# Patient Record
Sex: Male | Born: 1965 | Race: White | Hispanic: No | Marital: Single | Smoking: Former smoker
Health system: Southern US, Community
[De-identification: ages and names within clinical notes are randomized; demographics above are authoritative.]

## PROBLEM LIST (undated history)

## (undated) DIAGNOSIS — Z8739 Personal history of other diseases of the musculoskeletal system and connective tissue: Secondary | ICD-10-CM

## (undated) DIAGNOSIS — S73004A Unspecified dislocation of right hip, initial encounter: Secondary | ICD-10-CM

## (undated) DIAGNOSIS — I10 Essential (primary) hypertension: Secondary | ICD-10-CM

## (undated) DIAGNOSIS — Z992 Dependence on renal dialysis: Secondary | ICD-10-CM

## (undated) DIAGNOSIS — R7989 Other specified abnormal findings of blood chemistry: Secondary | ICD-10-CM

## (undated) DIAGNOSIS — S0990XA Unspecified injury of head, initial encounter: Secondary | ICD-10-CM

## (undated) DIAGNOSIS — K219 Gastro-esophageal reflux disease without esophagitis: Secondary | ICD-10-CM

## (undated) DIAGNOSIS — D649 Anemia, unspecified: Secondary | ICD-10-CM

## (undated) DIAGNOSIS — G629 Polyneuropathy, unspecified: Secondary | ICD-10-CM

## (undated) DIAGNOSIS — M869 Osteomyelitis, unspecified: Secondary | ICD-10-CM

## (undated) DIAGNOSIS — M069 Rheumatoid arthritis, unspecified: Secondary | ICD-10-CM

## (undated) DIAGNOSIS — E11621 Type 2 diabetes mellitus with foot ulcer: Secondary | ICD-10-CM

## (undated) DIAGNOSIS — I639 Cerebral infarction, unspecified: Secondary | ICD-10-CM

## (undated) DIAGNOSIS — Z9289 Personal history of other medical treatment: Secondary | ICD-10-CM

## (undated) DIAGNOSIS — F419 Anxiety disorder, unspecified: Secondary | ICD-10-CM

## (undated) DIAGNOSIS — M1460 Charcot's joint, unspecified site: Secondary | ICD-10-CM

## (undated) DIAGNOSIS — N186 End stage renal disease: Secondary | ICD-10-CM

## (undated) DIAGNOSIS — E119 Type 2 diabetes mellitus without complications: Secondary | ICD-10-CM

## (undated) DIAGNOSIS — R945 Abnormal results of liver function studies: Secondary | ICD-10-CM

## (undated) DIAGNOSIS — L97509 Non-pressure chronic ulcer of other part of unspecified foot with unspecified severity: Secondary | ICD-10-CM

## (undated) HISTORY — DX: Non-pressure chronic ulcer of other part of unspecified foot with unspecified severity: L97.509

## (undated) HISTORY — PX: FRACTURE SURGERY: SHX138

## (undated) HISTORY — PX: JOINT REPLACEMENT: SHX530

## (undated) HISTORY — DX: Charcot's joint, unspecified site: M14.60

## (undated) HISTORY — PX: ORIF HUMERUS FRACTURE: SHX2126

## (undated) HISTORY — DX: Unspecified dislocation of right hip, initial encounter: S73.004A

## (undated) HISTORY — PX: CLOSED REDUCTION HIP DISLOCATION: SUR221

## (undated) HISTORY — DX: Type 2 diabetes mellitus with foot ulcer: E11.621

---

## 1995-12-03 HISTORY — PX: TOTAL HIP ARTHROPLASTY: SHX124

## 1999-01-02 DIAGNOSIS — S0990XA Unspecified injury of head, initial encounter: Secondary | ICD-10-CM

## 1999-01-02 HISTORY — DX: Unspecified injury of head, initial encounter: S09.90XA

## 2003-07-18 ENCOUNTER — Encounter: Admission: RE | Admit: 2003-07-18 | Discharge: 2003-07-18 | Payer: Self-pay | Admitting: Family Medicine

## 2003-07-18 ENCOUNTER — Encounter: Payer: Self-pay | Admitting: Family Medicine

## 2005-06-26 ENCOUNTER — Inpatient Hospital Stay (HOSPITAL_COMMUNITY): Admission: EM | Admit: 2005-06-26 | Discharge: 2005-06-27 | Payer: Self-pay | Admitting: Emergency Medicine

## 2007-11-15 ENCOUNTER — Inpatient Hospital Stay (HOSPITAL_COMMUNITY): Admission: EM | Admit: 2007-11-15 | Discharge: 2007-11-20 | Payer: Self-pay | Admitting: Emergency Medicine

## 2007-11-16 ENCOUNTER — Encounter (INDEPENDENT_AMBULATORY_CARE_PROVIDER_SITE_OTHER): Payer: Self-pay | Admitting: Orthopedic Surgery

## 2007-11-17 ENCOUNTER — Ambulatory Visit: Payer: Self-pay | Admitting: Surgery

## 2007-12-09 ENCOUNTER — Encounter (HOSPITAL_BASED_OUTPATIENT_CLINIC_OR_DEPARTMENT_OTHER): Admission: RE | Admit: 2007-12-09 | Discharge: 2008-01-01 | Payer: Self-pay | Admitting: Surgery

## 2009-12-02 HISTORY — PX: FOOT SURGERY: SHX648

## 2010-01-06 ENCOUNTER — Emergency Department (HOSPITAL_COMMUNITY): Admission: EM | Admit: 2010-01-06 | Discharge: 2010-01-07 | Payer: Self-pay | Admitting: Emergency Medicine

## 2010-07-03 ENCOUNTER — Inpatient Hospital Stay (HOSPITAL_COMMUNITY): Admission: EM | Admit: 2010-07-03 | Discharge: 2010-07-13 | Payer: Self-pay | Admitting: Emergency Medicine

## 2010-07-05 ENCOUNTER — Ambulatory Visit: Payer: Self-pay | Admitting: Infectious Diseases

## 2010-08-01 ENCOUNTER — Encounter (HOSPITAL_BASED_OUTPATIENT_CLINIC_OR_DEPARTMENT_OTHER): Admission: RE | Admit: 2010-08-01 | Discharge: 2010-10-30 | Payer: Self-pay | Admitting: Internal Medicine

## 2010-11-01 ENCOUNTER — Encounter (HOSPITAL_BASED_OUTPATIENT_CLINIC_OR_DEPARTMENT_OTHER)
Admission: RE | Admit: 2010-11-01 | Discharge: 2010-11-22 | Payer: Self-pay | Source: Home / Self Care | Attending: Internal Medicine | Admitting: Internal Medicine

## 2011-02-12 LAB — GLUCOSE, CAPILLARY: Glucose-Capillary: 136 mg/dL — ABNORMAL HIGH (ref 70–99)

## 2011-02-14 LAB — GLUCOSE, CAPILLARY: Glucose-Capillary: 148 mg/dL — ABNORMAL HIGH (ref 70–99)

## 2011-02-15 LAB — CBC
HCT: 28 % — ABNORMAL LOW (ref 39.0–52.0)
HCT: 28.1 % — ABNORMAL LOW (ref 39.0–52.0)
HCT: 30.3 % — ABNORMAL LOW (ref 39.0–52.0)
HCT: 32.1 % — ABNORMAL LOW (ref 39.0–52.0)
Hemoglobin: 10.7 g/dL — ABNORMAL LOW (ref 13.0–17.0)
Hemoglobin: 11.7 g/dL — ABNORMAL LOW (ref 13.0–17.0)
Hemoglobin: 9.9 g/dL — ABNORMAL LOW (ref 13.0–17.0)
Hemoglobin: 9.9 g/dL — ABNORMAL LOW (ref 13.0–17.0)
MCH: 33.9 pg (ref 26.0–34.0)
MCH: 34 pg (ref 26.0–34.0)
MCH: 34.1 pg — ABNORMAL HIGH (ref 26.0–34.0)
MCH: 34.4 pg — ABNORMAL HIGH (ref 26.0–34.0)
MCH: 34.6 pg — ABNORMAL HIGH (ref 26.0–34.0)
MCHC: 34.7 g/dL (ref 30.0–36.0)
MCHC: 35.4 g/dL (ref 30.0–36.0)
MCV: 97.1 fL (ref 78.0–100.0)
MCV: 97.2 fL (ref 78.0–100.0)
MCV: 98.1 fL (ref 78.0–100.0)
MCV: 99.1 fL (ref 78.0–100.0)
MCV: 99.1 fL (ref 78.0–100.0)
Platelets: 156 10*3/uL (ref 150–400)
Platelets: 174 10*3/uL (ref 150–400)
Platelets: 178 10*3/uL (ref 150–400)
Platelets: 552 10*3/uL — ABNORMAL HIGH (ref 150–400)
Platelets: 718 10*3/uL — ABNORMAL HIGH (ref 150–400)
RBC: 2.88 MIL/uL — ABNORMAL LOW (ref 4.22–5.81)
RBC: 3.1 MIL/uL — ABNORMAL LOW (ref 4.22–5.81)
RBC: 3.24 MIL/uL — ABNORMAL LOW (ref 4.22–5.81)
RBC: 3.4 MIL/uL — ABNORMAL LOW (ref 4.22–5.81)
RDW: 13.5 % (ref 11.5–15.5)
RDW: 14.1 % (ref 11.5–15.5)
RDW: 14.1 % (ref 11.5–15.5)
WBC: 10.1 10*3/uL (ref 4.0–10.5)
WBC: 14.2 10*3/uL — ABNORMAL HIGH (ref 4.0–10.5)
WBC: 17.9 10*3/uL — ABNORMAL HIGH (ref 4.0–10.5)
WBC: 19.8 10*3/uL — ABNORMAL HIGH (ref 4.0–10.5)

## 2011-02-15 LAB — GLUCOSE, CAPILLARY
Glucose-Capillary: 117 mg/dL — ABNORMAL HIGH (ref 70–99)
Glucose-Capillary: 138 mg/dL — ABNORMAL HIGH (ref 70–99)
Glucose-Capillary: 139 mg/dL — ABNORMAL HIGH (ref 70–99)
Glucose-Capillary: 140 mg/dL — ABNORMAL HIGH (ref 70–99)
Glucose-Capillary: 149 mg/dL — ABNORMAL HIGH (ref 70–99)
Glucose-Capillary: 154 mg/dL — ABNORMAL HIGH (ref 70–99)
Glucose-Capillary: 158 mg/dL — ABNORMAL HIGH (ref 70–99)
Glucose-Capillary: 158 mg/dL — ABNORMAL HIGH (ref 70–99)
Glucose-Capillary: 159 mg/dL — ABNORMAL HIGH (ref 70–99)
Glucose-Capillary: 168 mg/dL — ABNORMAL HIGH (ref 70–99)
Glucose-Capillary: 175 mg/dL — ABNORMAL HIGH (ref 70–99)
Glucose-Capillary: 197 mg/dL — ABNORMAL HIGH (ref 70–99)
Glucose-Capillary: 200 mg/dL — ABNORMAL HIGH (ref 70–99)
Glucose-Capillary: 214 mg/dL — ABNORMAL HIGH (ref 70–99)
Glucose-Capillary: 219 mg/dL — ABNORMAL HIGH (ref 70–99)
Glucose-Capillary: 227 mg/dL — ABNORMAL HIGH (ref 70–99)
Glucose-Capillary: 229 mg/dL — ABNORMAL HIGH (ref 70–99)
Glucose-Capillary: 247 mg/dL — ABNORMAL HIGH (ref 70–99)
Glucose-Capillary: 286 mg/dL — ABNORMAL HIGH (ref 70–99)
Glucose-Capillary: 313 mg/dL — ABNORMAL HIGH (ref 70–99)
Glucose-Capillary: 320 mg/dL — ABNORMAL HIGH (ref 70–99)
Glucose-Capillary: 365 mg/dL — ABNORMAL HIGH (ref 70–99)

## 2011-02-15 LAB — BLOOD GAS, ARTERIAL
Bicarbonate: 23.7 mEq/L (ref 20.0–24.0)
Drawn by: 270211
FIO2: 1 %
O2 Saturation: 98.8 %
Patient temperature: 101.2
pH, Arterial: 7.483 — ABNORMAL HIGH (ref 7.350–7.450)
pO2, Arterial: 123 mmHg — ABNORMAL HIGH (ref 80.0–100.0)

## 2011-02-15 LAB — BASIC METABOLIC PANEL
BUN: 5 mg/dL — ABNORMAL LOW (ref 6–23)
BUN: 6 mg/dL (ref 6–23)
CO2: 26 mEq/L (ref 19–32)
CO2: 27 mEq/L (ref 19–32)
CO2: 27 mEq/L (ref 19–32)
CO2: 28 mEq/L (ref 19–32)
CO2: 28 mEq/L (ref 19–32)
Calcium: 7.7 mg/dL — ABNORMAL LOW (ref 8.4–10.5)
Calcium: 8.2 mg/dL — ABNORMAL LOW (ref 8.4–10.5)
Calcium: 8.4 mg/dL (ref 8.4–10.5)
Calcium: 9.1 mg/dL (ref 8.4–10.5)
Chloride: 101 mEq/L (ref 96–112)
Chloride: 95 mEq/L — ABNORMAL LOW (ref 96–112)
Chloride: 96 mEq/L (ref 96–112)
Chloride: 96 mEq/L (ref 96–112)
Chloride: 97 mEq/L (ref 96–112)
Chloride: 97 mEq/L (ref 96–112)
Creatinine, Ser: 0.89 mg/dL (ref 0.4–1.5)
Creatinine, Ser: 0.96 mg/dL (ref 0.4–1.5)
Creatinine, Ser: 1.01 mg/dL (ref 0.4–1.5)
Creatinine, Ser: 1.02 mg/dL (ref 0.4–1.5)
Creatinine, Ser: 1.08 mg/dL (ref 0.4–1.5)
Creatinine, Ser: 1.11 mg/dL (ref 0.4–1.5)
GFR calc Af Amer: >60 mL/min (ref >60)
GFR calc Af Amer: >60 mL/min (ref >60)
GFR calc Af Amer: >60 mL/min (ref >60)
GFR calc Af Amer: >60 mL/min (ref >60)
GFR calc non Af Amer: >60 mL/min (ref >60)
GFR calc non Af Amer: >60 mL/min (ref >60)
GFR calc non Af Amer: >60 mL/min (ref >60)
Glucose, Bld: 108 mg/dL — ABNORMAL HIGH (ref 70–99)
Glucose, Bld: 115 mg/dL — ABNORMAL HIGH (ref 70–99)
Glucose, Bld: 184 mg/dL — ABNORMAL HIGH (ref 70–99)
Glucose, Bld: 230 mg/dL — ABNORMAL HIGH (ref 70–99)
Potassium: 3.2 mEq/L — ABNORMAL LOW (ref 3.5–5.1)
Potassium: 3.5 mEq/L (ref 3.5–5.1)
Potassium: 3.8 mEq/L (ref 3.5–5.1)
Sodium: 128 mEq/L — ABNORMAL LOW (ref 135–145)
Sodium: 128 mEq/L — ABNORMAL LOW (ref 135–145)
Sodium: 131 mEq/L — ABNORMAL LOW (ref 135–145)

## 2011-02-15 LAB — FOLATE: Folate: 13.4 ng/mL

## 2011-02-15 LAB — COMPREHENSIVE METABOLIC PANEL
ALT: 32 U/L (ref 0–53)
AST: 43 U/L — ABNORMAL HIGH (ref 0–37)
Albumin: 2.9 g/dL — ABNORMAL LOW (ref 3.5–5.2)
Alkaline Phosphatase: 143 U/L — ABNORMAL HIGH (ref 39–117)
Potassium: 3.1 mEq/L — ABNORMAL LOW (ref 3.5–5.1)
Sodium: 130 mEq/L — ABNORMAL LOW (ref 135–145)
Total Protein: 7 g/dL (ref 6.0–8.3)

## 2011-02-15 LAB — IRON AND TIBC
Saturation Ratios: 4 % — ABNORMAL LOW (ref 20–55)
TIBC: 238 ug/dL (ref 215–435)
UIBC: 228 ug/dL

## 2011-02-15 LAB — CULTURE, BLOOD (ROUTINE X 2)
Culture: NO GROWTH
Culture: NO GROWTH

## 2011-02-15 LAB — ANAEROBIC CULTURE

## 2011-02-15 LAB — HEMOCCULT GUIAC POC 1CARD (OFFICE): Fecal Occult Bld: NEGATIVE

## 2011-02-15 LAB — DIFFERENTIAL
Basophils Relative: 1 % (ref 0–1)
Eosinophils Relative: 0 % (ref 0–5)
Lymphs Abs: 1 10*3/uL (ref 0.7–4.0)
Monocytes Absolute: 0.6 10*3/uL (ref 0.1–1.0)
Neutro Abs: 18 10*3/uL — ABNORMAL HIGH (ref 1.7–7.7)

## 2011-02-15 LAB — BODY FLUID CULTURE

## 2011-02-15 LAB — PROCALCITONIN: Procalcitonin: 1.96 ng/mL

## 2011-02-15 LAB — VANCOMYCIN, TROUGH: Vancomycin Tr: 16.1 ug/mL (ref 10.0–20.0)

## 2011-02-15 LAB — LACTIC ACID, PLASMA: Lactic Acid, Venous: 1.7 mmol/L (ref 0.5–2.2)

## 2011-02-15 LAB — WOUND CULTURE

## 2011-02-15 LAB — GRAM STAIN

## 2011-02-15 LAB — HIV ANTIBODY (ROUTINE TESTING W REFLEX): HIV: NONREACTIVE

## 2011-02-15 LAB — URIC ACID: Uric Acid, Serum: 3.3 mg/dL — ABNORMAL LOW (ref 4.0–7.8)

## 2011-02-15 LAB — MRSA PCR SCREENING: MRSA by PCR: NEGATIVE

## 2011-02-15 LAB — FERRITIN: Ferritin: 385 ng/mL — ABNORMAL HIGH (ref 22–322)

## 2011-02-20 LAB — GLUCOSE, CAPILLARY

## 2011-03-05 ENCOUNTER — Encounter (HOSPITAL_BASED_OUTPATIENT_CLINIC_OR_DEPARTMENT_OTHER): Payer: 59 | Attending: Internal Medicine

## 2011-03-05 DIAGNOSIS — Z79899 Other long term (current) drug therapy: Secondary | ICD-10-CM | POA: Insufficient documentation

## 2011-03-05 DIAGNOSIS — Z96649 Presence of unspecified artificial hip joint: Secondary | ICD-10-CM | POA: Insufficient documentation

## 2011-03-05 DIAGNOSIS — Z794 Long term (current) use of insulin: Secondary | ICD-10-CM | POA: Insufficient documentation

## 2011-03-05 DIAGNOSIS — L97509 Non-pressure chronic ulcer of other part of unspecified foot with unspecified severity: Secondary | ICD-10-CM | POA: Insufficient documentation

## 2011-03-05 DIAGNOSIS — E1149 Type 2 diabetes mellitus with other diabetic neurological complication: Secondary | ICD-10-CM | POA: Insufficient documentation

## 2011-03-05 DIAGNOSIS — F17200 Nicotine dependence, unspecified, uncomplicated: Secondary | ICD-10-CM | POA: Insufficient documentation

## 2011-03-05 DIAGNOSIS — E1169 Type 2 diabetes mellitus with other specified complication: Secondary | ICD-10-CM | POA: Insufficient documentation

## 2011-03-05 DIAGNOSIS — M2040 Other hammer toe(s) (acquired), unspecified foot: Secondary | ICD-10-CM | POA: Insufficient documentation

## 2011-03-07 ENCOUNTER — Other Ambulatory Visit (HOSPITAL_BASED_OUTPATIENT_CLINIC_OR_DEPARTMENT_OTHER): Payer: Self-pay | Admitting: General Surgery

## 2011-03-07 ENCOUNTER — Ambulatory Visit (HOSPITAL_COMMUNITY)
Admission: RE | Admit: 2011-03-07 | Discharge: 2011-03-07 | Disposition: A | Discharge status: Home / Self Care | Payer: 59 | Source: Ambulatory Visit | Attending: General Surgery | Admitting: General Surgery

## 2011-03-07 DIAGNOSIS — M869 Osteomyelitis, unspecified: Secondary | ICD-10-CM

## 2011-03-07 DIAGNOSIS — S91309A Unspecified open wound, unspecified foot, initial encounter: Secondary | ICD-10-CM | POA: Insufficient documentation

## 2011-03-07 DIAGNOSIS — X58XXXA Exposure to other specified factors, initial encounter: Secondary | ICD-10-CM | POA: Insufficient documentation

## 2011-03-07 DIAGNOSIS — M216X9 Other acquired deformities of unspecified foot: Secondary | ICD-10-CM | POA: Insufficient documentation

## 2011-03-07 DIAGNOSIS — E119 Type 2 diabetes mellitus without complications: Secondary | ICD-10-CM | POA: Insufficient documentation

## 2011-03-21 ENCOUNTER — Other Ambulatory Visit (HOSPITAL_BASED_OUTPATIENT_CLINIC_OR_DEPARTMENT_OTHER): Payer: Self-pay | Admitting: Internal Medicine

## 2011-03-21 LAB — GLUCOSE, CAPILLARY: Glucose-Capillary: 238 mg/dL — ABNORMAL HIGH (ref 70–99)

## 2011-04-11 ENCOUNTER — Encounter (HOSPITAL_BASED_OUTPATIENT_CLINIC_OR_DEPARTMENT_OTHER): Payer: 59
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
## 2011-04-16 NOTE — Assessment & Plan Note (Signed)
[facility]   NAME:  Wesley Burns, Wesley Burns              ACCOUNT NO.:  [id number]   MEDICAL RECORD NO.:  [id number]      DATE OF BIRTH:  1966/02/17   PHYSICIAN:  Theresia Majors. Tanda Rockers, M.D. VISIT DATE:  12/21/2007                                   OFFICE VISIT   SUBJECTIVE:  Mr. Dumond is a 45 year old male who we have seen for  neuropathic ulcers involving both feet. In the interim he has treated  with modified offloading healing sandals and continued antiseptic soap  washing and foot hygiene.  He returns for follow-up.  There has been no  excessive drainage, malodor,  or fever. He is complaining of occasional  pain.   OBJECTIVE:  Inspection of both feet shows that the ulcers are clean.  There are halos of callus with some desquamated and nonviable tissue.  No debridement was needed.  The new wounds were numbered 1, 2, 3 and 4.  They were measured and entered into the database.  Please refer to those  entries.   PHYSICAL EXAMINATION:  VITALS:  Blood pressure is 145/90, respirations  16, pulse rate 90, temperature is 98.8.  EXTREMITIES:  Both feet are warm with readidly palpable pulses.  They  are insensate to the signs Weinstein filament.  There is bilateral 1+  edema. There is no  ascending lymphangitis, cellulitis.   IMPRESSION:  Neuropathic ulcers improved.   PLAN:  We have given the patient prescription for bilateral custom  inserts for offloading of the met heads.  We have modified the healing  sandals with transverse felt strips. We have instructed the patient to  continue local hygiene utilizing antiseptic soap.  We will reevaluate  him in 2 weeks. In the interim he will be seen by [facility] for his custom  orthotics.  He has been given a singel prescription for Vicodan and  advised to identify his PCP prior to his next visit as he may need  ongoing management of his neuropathic pain. We have given the patient an  opportunity to ask questions.  He seems to  understand and indicates that  he will be compliant.      Harold A. Tanda Rockers, M.D.  Electronically Signed     HAN/MEDQ  D:  12/21/2007  T:  12/21/2007  Job:  [id number]

## 2011-04-16 NOTE — Discharge Summary (Signed)
Wesley Burns, Wesley Burns              ACCOUNT NO.:  [id number]   MEDICAL RECORD NO.:  [id number]          PATIENT TYPE:  INP   LOCATION:  5015                         FACILITY:  [facility]   PHYSICIAN:  Madaline Savage, MD        DATE OF BIRTH:  August 03, 1966   DATE OF ADMISSION:  11/15/2007  DATE OF DISCHARGE:  11/20/2007                               DISCHARGE SUMMARY   PRIMARY CARE PHYSICIAN:  None.  This patient is unassigned to [place].   CONSULTATIONS IN THE HOSPITAL:  Patient was seen by Dr. Shon Baton from  orthopedics.   FINAL DISCHARGE DIAGNOSES:  1. A nonhealing infection in the left foot.  2. 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  3. Prediabetes.  4. Hypertension.  5. Febrile illness.   DISCHARGE MEDICATIONS:  1. Clindamycin 450 mg 4 times daily x2 weeks.  2. Norvasc 5 mg once daily.  3. Colchicine 0.6 mg every 6 hours as needed.  4. Debrox ear drops 2 drops to the left ear twice daily.  5. Tylenol P.M. at bedtime as needed.  6. Ibuprofen 800 mg 3 times daily as needed.  7. Centrum multivitamin daily.   HISTORY OF PRESENT ILLNESS:  For a full history and physical, see the  history and physical dictated by Dr. Christella Noa on November 15, 2007.  Mr.  Jaworski is a 45 year old gentleman who was admitted with left foot  swelling and pain for the last couple of days.  [name] has had a  nonhealing infection in his left second toe, which has been going on for  at least six months, which he states has probably been going on for the  last two years.  He has not seen a doctor for this.  He was in the past  seen by [facility] approximately two years ago.  He was  admitted with a question of osteomyelitis in his left foot, and  orthopedics were consulted.   PROCEDURES DONE IN THE HOSPITAL:  1. He had a chest x-ray done on November 15, 2007 which showed no      acute cardiopulmonary findings.  2. He had an x-ray of the left foot done on November 15, 2007 which      showed probable  osteomyelitis of the tuft of the distal pharyngeal      bone of the third toe.  3. He had an x-ray of the right foot on November 15, 2007 which showed      hallux or valgus with degenerative changes.  4. He had an MRI of the left foot done on November 16, 2007 which      showed Charcot foot changes with severe degenerative disease.      Remote healed third metatarsal shaft fracture.  Mild diffuse      myositis.  Soft tissue ulcer and cellulitis involving the great toe      without MR findings of osteomyelitis.  Findings suspicious for      osteomyelitis involving the second and third distal tuft.  Advanced      degenerative disease at the first and second  metatarsophalangeal      joints.  5. He had an MRA of the right foot done on November 16, 2007 which      showed early Charcot changes, abnormal signal intensity in the      cuboid, lateral cuneiform, and fourth metatarsal.  Cannot totally      excluded osteomyelitis but it is less likely first      metatarsophalangeal joint, inflammatory arthropathy, gout is a      strong possibility.  6. He had an x-ray of his right knee done on November 17, 2007 which      showed possible small joint effusion.  7. He had a bone scan done on November 16, 2007 which showed extensive      mid foot uptake on the left, likely due to Charcot changes.  There      are findings suspicious for osteomyelitis involving the second and      third distal phalanges.  In the right, there is increased uptake      laterally in the mid foot.  It may be stress related.   Nonhealing left foot infection:  [name] was admitted with increased  swelling in his left foot and was admitted and started on Unasyn and  Clindamycin.  We consulted orthopedics, Dr. Shon Baton.  Dr. Shon Baton reviewed  the MRI and bone scan.  At this time, there is no definite osteomyelitis  in his foot.  At this time, he recommended to continue treating with  antibiotics and following up with Dr.  Lestine Box with [facility].  He has seen [facility] in the past.  We did a  wound culture on him, which grew Staphylococcus aureus, which was  methicillin sensitive and was sensitive to Clindamycin.  So we will be  continuing him on Clindamycin at this time for two weeks.  He will also  need to be followed up by the wound care clinic, as per Dr. Shon Baton.  Dr.  Shon Baton has also gotten him diabetic boots.   Acute gout:  [name] had acute swelling of his right knee.  We tapped  the right knee, and the synovial fluid showed intra and extracellular  monosodium urate crystals which forms acute gout.  He had a white count  of 43,000, and his cultures were negative.  His Gram's stain was also  negative.  He was started on colchicine with remarkable improvement.  At  this time, he is able to bear weight on his right leg without much  difficulty.  I have asked him to continue colchicine until his pain  completely goes away.  This will need to be followed up as an  outpatient. He will need to be started on Allopurinol as an outpatient  once this acute episode subsides.   Prediabetes:  [name] had episodes where his sugars were high while  he was in the hospital.  He had a fasting blood sugar of 158 on one  episode.  This could not repeated again.  He also had another fasting  blood sugar which was 123.  He definitely qualifies criteria for  prediabetes.  We have had diabetic education while he was in the  hospital, and I will refer him for outpatient diabetic education.  We  can consider starting him on Metformin as an outpatient.  He will need  close followup for this.   Hypertension:  His blood pressures were elevated while he was in the  hospital.  He was started on  Norvasc and since then, his blood pressure  has been well controlled.  I will continue him on Norvasc at 5 mg daily.   Febrile illness:  [name] spiked a fever of up to 102, which has been  trending  down.  At the time of discharge, he is afebrile.  He had an  elevated white count up to 16,000, which has come down to normal at the  time of discharge.  I have told him that if he starts spiking fever as  an outpatient, then he will need to come back to the emergency room.   DISPOSITION:  At this time, he is being discharged home in a stable  condition.  He is asked to follow up with Dr. Lestine Box at [facility] in 1-2 weeks.  He is given his phone number of [phone].  We  will arrange for followup with [facility] in 1-2 weeks.  I will also  arrange for a wound care clinic followup in 1-2 weeks.      Madaline Savage, MD  Electronically Signed     PKN/MEDQ  D:  11/20/2007  T:  11/20/2007  Job:  [id number]
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
## 2011-04-19 NOTE — Consult Note (Signed)
Wesley Burns, Wesley Burns              ACCOUNT NO.:  [id number]   MEDICAL RECORD NO.:  [id number]          PATIENT TYPE:  INP   LOCATION:  1422                         [facility]   PHYSICIAN:  Nadara Mustard, MD     DATE OF BIRTH:  08-07-1966   DATE OF CONSULTATION:  DATE OF DISCHARGE:                                   CONSULTATION   Audio too short to transcribe (less than 5 seconds)       MVD/MEDQ  D:  06/27/2005  T:  06/27/2005  Job:  [id number]
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
## 2011-09-06 LAB — DIFFERENTIAL
Basophils Absolute: 0
Basophils Relative: 0
Basophils Relative: 0
Eosinophils Absolute: 0.3
Eosinophils Absolute: 0.4
Eosinophils Relative: 1
Eosinophils Relative: 3
Lymphocytes Relative: 14
Lymphs Abs: 2.3
Monocytes Absolute: 1.1 — ABNORMAL HIGH
Monocytes Absolute: 1.4 — ABNORMAL HIGH
Monocytes Relative: 11
Neutro Abs: 5
Neutrophils Relative %: 64

## 2011-09-06 LAB — BASIC METABOLIC PANEL
BUN: 6
CO2: 25
CO2: 27
CO2: 30
Calcium: 7.5 — ABNORMAL LOW
Chloride: 102
Chloride: 99
Chloride: 99
Chloride: 99
Creatinine, Ser: 1.03
Creatinine, Ser: 1.38
GFR calc Af Amer: 53 — ABNORMAL LOW
GFR calc Af Amer: >60
GFR calc Af Amer: >60
GFR calc non Af Amer: 53 — ABNORMAL LOW
Glucose, Bld: 91
Potassium: 3.3 — ABNORMAL LOW
Potassium: 3.7
Potassium: 3.7
Sodium: 135
Sodium: 138
Sodium: 139

## 2011-09-06 LAB — BODY FLUID CULTURE: Culture: NO GROWTH

## 2011-09-06 LAB — CBC
HCT: 31.9 — ABNORMAL LOW
HCT: 34.7 — ABNORMAL LOW
Hemoglobin: 11.2 — ABNORMAL LOW
Hemoglobin: 11.7 — ABNORMAL LOW
MCHC: 34.5
MCHC: 35.2
MCV: 100.7 — ABNORMAL HIGH
MCV: 99.1
Platelets: 246
RBC: 3.22 — ABNORMAL LOW
RDW: 13.7
WBC: 16.1 — ABNORMAL HIGH

## 2011-09-06 LAB — SYNOVIAL CELL COUNT + DIFF, W/ CRYSTALS: Neutrophil, Synovial: 97 — ABNORMAL HIGH

## 2011-09-06 LAB — POTASSIUM: Potassium: 3.4 — ABNORMAL LOW

## 2011-09-06 LAB — GLUCOSE, SYNOVIAL FLUID: Glucose, Synovial Fluid: 55

## 2011-09-06 LAB — PROTEIN, BODY FLUID: Total protein, fluid: 2.8

## 2011-09-09 LAB — HEMOGLOBIN A1C
Hgb A1c MFr Bld: 5.8
Mean Plasma Glucose: 129

## 2011-09-09 LAB — CBC
HCT: 46.5
Hemoglobin: 16
MCHC: 34.5
MCV: 100.7 — ABNORMAL HIGH
RBC: 4.62
RDW: 13.6

## 2011-09-09 LAB — COMPREHENSIVE METABOLIC PANEL
ALT: 16
BUN: <1 — ABNORMAL LOW
CO2: 27
Calcium: 9.3
Creatinine, Ser: 0.57
GFR calc non Af Amer: >60
Glucose, Bld: 123 — ABNORMAL HIGH
Sodium: 139
Total Protein: 8.6 — ABNORMAL HIGH

## 2011-09-09 LAB — URINE CULTURE: Culture: NO GROWTH

## 2011-09-09 LAB — DIFFERENTIAL
Basophils Absolute: 0.1
Basophils Relative: 1
Neutro Abs: 3.7
Neutrophils Relative %: 46

## 2011-09-09 LAB — CULTURE, BLOOD (ROUTINE X 2)
Culture: NO GROWTH
Culture: NO GROWTH

## 2011-09-09 LAB — URINALYSIS, ROUTINE W REFLEX MICROSCOPIC
Bilirubin Urine: NEGATIVE
Nitrite: NEGATIVE
Protein, ur: NEGATIVE
Specific Gravity, Urine: 1.009
Urobilinogen, UA: 0.2

## 2011-09-09 LAB — WOUND CULTURE

## 2011-09-09 LAB — CORTISOL: Cortisol, Plasma: 12.7

## 2011-09-09 LAB — PROTIME-INR: Prothrombin Time: 12.7

## 2011-09-24 ENCOUNTER — Emergency Department (HOSPITAL_COMMUNITY): Payer: 59

## 2011-09-24 ENCOUNTER — Inpatient Hospital Stay (HOSPITAL_COMMUNITY)
Admission: EM | Admit: 2011-09-24 | Discharge: 2011-09-27 | DRG: 481 | Disposition: A | Discharge status: Home / Self Care | Payer: 59 | Attending: Orthopedic Surgery | Admitting: Orthopedic Surgery

## 2011-09-24 DIAGNOSIS — T84029A Dislocation of unspecified internal joint prosthesis, initial encounter: Principal | ICD-10-CM | POA: Diagnosis present

## 2011-09-24 DIAGNOSIS — X58XXXA Exposure to other specified factors, initial encounter: Secondary | ICD-10-CM

## 2011-09-24 DIAGNOSIS — I10 Essential (primary) hypertension: Secondary | ICD-10-CM | POA: Diagnosis present

## 2011-09-24 DIAGNOSIS — Y92009 Unspecified place in unspecified non-institutional (private) residence as the place of occurrence of the external cause: Secondary | ICD-10-CM

## 2011-09-24 DIAGNOSIS — Z96649 Presence of unspecified artificial hip joint: Secondary | ICD-10-CM

## 2011-09-24 DIAGNOSIS — E119 Type 2 diabetes mellitus without complications: Secondary | ICD-10-CM | POA: Diagnosis present

## 2011-09-24 DIAGNOSIS — F17200 Nicotine dependence, unspecified, uncomplicated: Secondary | ICD-10-CM | POA: Diagnosis present

## 2011-09-24 DIAGNOSIS — L97809 Non-pressure chronic ulcer of other part of unspecified lower leg with unspecified severity: Secondary | ICD-10-CM | POA: Diagnosis present

## 2011-09-24 DIAGNOSIS — Z794 Long term (current) use of insulin: Secondary | ICD-10-CM

## 2011-09-24 LAB — COMPREHENSIVE METABOLIC PANEL
ALT: 45 U/L (ref 0–53)
AST: 105 U/L — ABNORMAL HIGH (ref 0–37)
Albumin: 3.3 g/dL — ABNORMAL LOW (ref 3.5–5.2)
Alkaline Phosphatase: 177 U/L — ABNORMAL HIGH (ref 39–117)
BUN: <3 mg/dL — ABNORMAL LOW (ref 6–23)
CO2: 23 mEq/L (ref 19–32)
Calcium: 8.6 mg/dL (ref 8.4–10.5)
Chloride: 95 mEq/L — ABNORMAL LOW (ref 96–112)
Creatinine, Ser: 0.51 mg/dL (ref 0.50–1.35)
GFR calc Af Amer: >90 mL/min (ref >90)
GFR calc non Af Amer: >90 mL/min (ref >90)
Glucose, Bld: 98 mg/dL (ref 70–99)
Potassium: 3.2 mEq/L — ABNORMAL LOW (ref 3.5–5.1)
Sodium: 135 mEq/L (ref 135–145)
Total Bilirubin: 0.7 mg/dL (ref 0.3–1.2)
Total Protein: 7.1 g/dL (ref 6.0–8.3)

## 2011-09-24 LAB — DIFFERENTIAL
Basophils Absolute: 0.1 10*3/uL (ref 0.0–0.1)
Basophils Relative: 1 % (ref 0–1)
Eosinophils Absolute: 0.1 10*3/uL (ref 0.0–0.7)
Eosinophils Relative: 1 % (ref 0–5)
Lymphocytes Relative: 18 % (ref 12–46)
Lymphs Abs: 1.5 10*3/uL (ref 0.7–4.0)
Monocytes Absolute: 1 10*3/uL (ref 0.1–1.0)
Monocytes Relative: 12 % (ref 3–12)
Neutro Abs: 5.7 10*3/uL (ref 1.7–7.7)
Neutrophils Relative %: 69 % (ref 43–77)

## 2011-09-24 LAB — CBC
HCT: 46.6 % (ref 39.0–52.0)
Hemoglobin: 16.7 g/dL (ref 13.0–17.0)
MCH: 34.3 pg — ABNORMAL HIGH (ref 26.0–34.0)
MCHC: 35.8 g/dL (ref 30.0–36.0)
MCV: 95.7 fL (ref 78.0–100.0)
Platelets: 240 10*3/uL (ref 150–400)
RBC: 4.87 MIL/uL (ref 4.22–5.81)
RDW: 14.5 % (ref 11.5–15.5)
WBC: 8.3 10*3/uL (ref 4.0–10.5)

## 2011-09-25 ENCOUNTER — Emergency Department (HOSPITAL_COMMUNITY): Payer: 59

## 2011-09-25 ENCOUNTER — Inpatient Hospital Stay (HOSPITAL_COMMUNITY): Payer: 59

## 2011-09-25 LAB — COMPREHENSIVE METABOLIC PANEL
ALT: 37 U/L (ref 0–53)
AST: 63 U/L — ABNORMAL HIGH (ref 0–37)
CO2: 30 mEq/L (ref 19–32)
Calcium: 8.1 mg/dL — ABNORMAL LOW (ref 8.4–10.5)
Chloride: 91 mEq/L — ABNORMAL LOW (ref 96–112)
Creatinine, Ser: 0.5 mg/dL (ref 0.50–1.35)
GFR calc Af Amer: >90 mL/min (ref >90)
GFR calc non Af Amer: >90 mL/min (ref >90)
Glucose, Bld: 135 mg/dL — ABNORMAL HIGH (ref 70–99)
Sodium: 135 mEq/L (ref 135–145)
Total Bilirubin: 0.9 mg/dL (ref 0.3–1.2)

## 2011-09-25 LAB — GLUCOSE, CAPILLARY
Glucose-Capillary: 128 mg/dL — ABNORMAL HIGH (ref 70–99)
Glucose-Capillary: 124 mg/dL — ABNORMAL HIGH (ref 70–99)
Glucose-Capillary: 109 mg/dL — ABNORMAL HIGH (ref 70–99)

## 2011-09-25 LAB — CBC
Hemoglobin: 16 g/dL (ref 13.0–17.0)
MCH: 34.3 pg — ABNORMAL HIGH (ref 26.0–34.0)
MCV: 97.4 fL (ref 78.0–100.0)
Platelets: 242 10*3/uL (ref 150–400)
RBC: 4.67 MIL/uL (ref 4.22–5.81)
WBC: 7.7 10*3/uL (ref 4.0–10.5)

## 2011-09-25 LAB — DIFFERENTIAL
Lymphs Abs: 1.2 10*3/uL (ref 0.7–4.0)
Monocytes Relative: 13 % — ABNORMAL HIGH (ref 3–12)
Neutro Abs: 5.4 10*3/uL (ref 1.7–7.7)
Neutrophils Relative %: 70 % (ref 43–77)

## 2011-09-26 LAB — GLUCOSE, CAPILLARY
Glucose-Capillary: 112 mg/dL — ABNORMAL HIGH (ref 70–99)
Glucose-Capillary: 107 mg/dL — ABNORMAL HIGH (ref 70–99)
Glucose-Capillary: 76 mg/dL (ref 70–99)
Glucose-Capillary: 126 mg/dL — ABNORMAL HIGH (ref 70–99)
Glucose-Capillary: 102 mg/dL — ABNORMAL HIGH (ref 70–99)

## 2011-09-27 LAB — GLUCOSE, CAPILLARY: Glucose-Capillary: 72 mg/dL (ref 70–99)

## 2011-10-02 NOTE — Op Note (Signed)
Wesley Burns, Wesley Burns              ACCOUNT NO.:  [id number]  MEDICAL RECORD NO.:  [id number]  LOCATION:  1502                         FACILITY:  [facility]  PHYSICIAN:  Marlowe Kays, M.D.  DATE OF BIRTH:  01-14-1966  DATE OF PROCEDURE:  09/25/2011 DATE OF DISCHARGE:                              OPERATIVE REPORT   PREOPERATIVE DIAGNOSIS:  Irreducible dislocation right total hip arthroplasty by closed means.  POSTOPERATIVE DIAGNOSIS:  Irreducible dislocation right total hip arthroplasty by closed means.  OPERATION:  Open reduction of dislocated right total hip arthroplasty.  SURGEON:  Marlowe Kays, M.D.  ASSIST:  Druscilla Brownie. Cherlynn June.  ANESTHESIA:  General.  Mr. Angie Fava assistance was required because of the complexity of the operation with retraction and assistance with instrumentation.  We had tried to reduce his hip under closed means shortly after midnight, earlier today.  I did realize that he most likely had soft tissue interposed in the acetabulum and hence he is here today for open reduction.  The [employer] rep had been notified and was available, and was prepared for revision of the acetabulum and if necessary or change in the femoral head length if necessary.  One additional bit of history is that a year and a half ago, he had apparently elsewhere dislocation of his hip which was reduced in the emergency room setting.  When talking to his friend, Michele Mcalpine, after the surgery today, Michele Mcalpine indicated that he probably had never really walked the same following the previous dislocation.  The original surgery was 15 years ago.  PROCEDURE:  Prophylactic antibiotics, satisfied general anesthesia, left lateral decubitus position, on marked to frame, right hip was prepped with DuraPrep, draped in a sterile field.  IV employed.  Time-out performed.  I went through the old posterolateral incision and with careful dissection, looked my way down to the very dense fibrous  tissue posterior to the greater trochanter to locate the femoral head.  It was tightly encased in fibrous tissue, which I gradually meticulously cleared away with combination of cutting cautery and rongeur, just medial to it I then was able to locate the metal-backed acetabular shell and adjacent to it the polyethylene insert.  This acetabular cup was completely filled with fibrous tissue which was clearly very mature and showed no sign that the hip had been reduced at all.  The acetabulum was cleared of all fibrous tissue.  I was then able to reduced the hip with the using a bone hook and traction rotation by Mr. Idolina Primer.  The hip was then tested.  It was completely stable with no pistoning on the vertical lateral pull and no evidence of there was any tendency for dislocation on rotation.  Wound was irrigated with sterile saline.  I repaired the external rotators and gluteal muscle with interrupted #1 Vicryl, fascia lata with the same, subcutaneous tissue with 2-0 Vicryl, and staples in the skin.  Betadine, Adaptic, and dry sterile dressing were applied.  He was gently placed on his back and knee immobilizers which I thought would be sufficient for stability.  He tolerated the procedure well and was taken to the recovery room in satisfied condition with no known complications.  Estimated blood  loss, 75 to 100 cc.  No blood replacement.          ______________________________ Marlowe Kays, M.D.     JA/MEDQ  D:  09/25/2011  T:  09/26/2011  Job:  [id number]  Electronically Signed by Marlowe Kays M.D. on 10/02/2011 11:52:27 AM
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
## 2011-10-02 NOTE — H&P (Signed)
Wesley Burns, Wesley Burns              ACCOUNT NO.:  [id number]  MEDICAL RECORD NO.:  [id number]  LOCATION:  1502                         FACILITY:  [facility]  PHYSICIAN:  Marlowe Kays, M.D.  DATE OF BIRTH:  04-10-1966  DATE OF ADMISSION:  09/24/2011 DATE OF DISCHARGE:  09/27/2011                             HISTORY & PHYSICAL   CHIEF COMPLAINT:  Pain in the right hip.  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  It was reduced a year and a half ago, by one of our partners.  The patient was at home, sitting on the couch, or chair and bent over to pick something up the floor.  Immediate pain in the right hip, inability to straighten the hip or the knee.  He is brought to the emergency room. Records reveal a superior lateral dislocation of the right hip.  It was decided to attempt a closed reduction, which we did later in the evening when the operating room was available.  We were unable to reduce the hip.  It was highly suspected that there was scar tissue or other matter in the acetabulum, preventing its location, and we admitted him for and scheduled him for open reduction, to the right total hip.  PAST MEDICAL HISTORY:  This gentleman has been in relatively good health.  He does have diabetes, has current being treated by the wound center for a foot ulcer on the plantar surface.  ALLERGIES:  He is allergic to PREDNISONE.  He had no food allergies.  CURRENT MEDICATIONS: 1. Metformin 1000 mg b.i.d. 2. Lisinopril 40 mg b.i.d. 3. Amlodipine 5 mg daily. 4. He has taken amoxicillin and clavulanate 875 mg b.i.d. 5. He is using NovoLog insulin for his diabetes. 6. He is also on tramadol 50 mg p.r.n. pain.  PAST SURGERIES:  Include foot treatment and minor debridement as well as the total hips mentioned  above.  SOCIAL HISTORY:  He does smoke half a pack to pack a day.  Occasional EtOH.  FAMILY HISTORY:  Noncontributory.  REVIEW OF SYSTEMS:  CNS:  No seizures or paralysis, numbness, double vision.  RESPIRATORY:  No productive cough.  No hemoptysis.  No shortness of breath.  CARDIOVASCULAR:  No chest pain.  No angina.  No orthopnea.  GASTROINTESTINAL:  No nausea, vomiting, melena, or bloody stool.  GENITOURINARY:  No discharge, dysuria, or hematuria.  PHYSICAL EXAMINATION:  GENERAL:  Alert and cooperative, decidedly uncomfortable 45 year old white male, who is awake, alert, and oriented. HEENT:  Normocephalic.  PERRLA.  Oropharynx is clear. CHEST:  Clear to auscultation.  No rhonchi or rales. HEART:  Regular rate and rhythm.  No murmurs are heard. ABDOMEN:  Soft, nontender.  Liver and spleen, not felt. GENITALIA:  Rectal not done.  Not to pertinent present illness. EXTREMITIES:  Right lower extremity, he has a flexed hip, flexed knee, inability to straighten.  He has good sensation and good motor to the right lower extremity.  ADMITTING DIAGNOSIS:  Dislocation of the right total hip.  PLAN:  The patient with open reduction of the right total hip.     [name].   ______________________________ Marlowe Kays, M.D.    DLU/MEDQ  D:  09/27/2011  T:  09/27/2011  Job:  [id number]  cc:   Marlowe Kays, M.D. Fax: [phone]  Electronically Signed by Marlowe Kays M.D. on 10/02/2011 11:52:35 AM

## 2011-10-15 ENCOUNTER — Encounter (HOSPITAL_BASED_OUTPATIENT_CLINIC_OR_DEPARTMENT_OTHER): Payer: 59

## 2011-10-17 ENCOUNTER — Encounter (HOSPITAL_BASED_OUTPATIENT_CLINIC_OR_DEPARTMENT_OTHER): Payer: 59 | Attending: Internal Medicine

## 2011-10-17 DIAGNOSIS — L84 Corns and callosities: Secondary | ICD-10-CM | POA: Insufficient documentation

## 2011-10-17 DIAGNOSIS — Z8614 Personal history of Methicillin resistant Staphylococcus aureus infection: Secondary | ICD-10-CM | POA: Insufficient documentation

## 2011-10-17 DIAGNOSIS — L97509 Non-pressure chronic ulcer of other part of unspecified foot with unspecified severity: Secondary | ICD-10-CM | POA: Insufficient documentation

## 2011-10-17 DIAGNOSIS — E1169 Type 2 diabetes mellitus with other specified complication: Secondary | ICD-10-CM | POA: Insufficient documentation

## 2011-10-17 DIAGNOSIS — E1149 Type 2 diabetes mellitus with other diabetic neurological complication: Secondary | ICD-10-CM | POA: Insufficient documentation

## 2011-10-17 NOTE — Progress Notes (Unsigned)
[facility]  NAME:  BRANDI, ARMATO NO.:  [id number]  MEDICAL RECORD NO.:  [id number]      DATE OF BIRTH:  [birth year]  PHYSICIAN:  Maxwell Caul, M.D.      VISIT DATE:                                  OFFICE VISIT   Wesley Burns is a gentleman that we have seen in this clinic several times in the past.  Most recently, we cared for him through the latter part of 2011 and early 2012, at which time, he had a left forefoot cellulitis, subcutaneous abscess, and osteomyelitis involving the transmetatarsal joints.  He has known diabetes with Charcot foot on the left side.  He has had problems with methicillin-sensitive Staph aureus infections in his foot previously.  On this occasion, he tells me that over the last 5 weeks, he developed a wound over the plantar aspect of his right foot.  The history here is a bit vague.  He was planning to travel out of the country, however, he decided to come and see [place] before he did this.  He has not been running a fever.  He does have history of wounds mostly on the left foot.  On examination, his temperature is 97.7, pulse 82, respirations 18, blood pressure 131/89.  The wound is a substantial area over the right first plantar metatarsal head measured 2.2 x 3 x 0.2.  The patient underwent a debridement of surrounding callus, also removal of nonviable material, and some subcutaneous tissue from the surface of the wound. This cleaned up fairly well.  He has a large subluxed first metatarsal head over this area, which will complicate healing.  I also looked at the left foot.  He had a callus and what looked like an old blister on the left first metatarsal head.  I debrided this, removed the callus, there does not appear to be an underlying wound here.  IMPRESSION:  At least a Wagner's toe wound over the right first metatarsal head.  The culture of this area was done.  He is already on Augmentin and  ciprofloxacin, I think prescribed by Dr. [name] after surgery he had for a dislocated right hip.  The hip incision actually looks quite good.  There was no reason to continue the antibiotics at this point.  We will order a x-ray of the left first metatarsal head. It is likely he may require an MRI.  The wound was dressed with silver collagen, Hydrogel, Vaseline gauze, with a foam dressing.  We gave him on offloading sandal.  As mentioned, a plain x-ray of the foot was ordered.  A culture was done; however, I did not adjust his current antibiotics.          ______________________________ Maxwell Caul, M.D.     MGR/MEDQ  D:  10/17/2011  T:  10/17/2011  Job:  [id number]

## 2011-11-05 ENCOUNTER — Encounter (HOSPITAL_BASED_OUTPATIENT_CLINIC_OR_DEPARTMENT_OTHER): Payer: 59 | Attending: Internal Medicine

## 2011-11-05 DIAGNOSIS — E1149 Type 2 diabetes mellitus with other diabetic neurological complication: Secondary | ICD-10-CM | POA: Insufficient documentation

## 2011-11-05 DIAGNOSIS — L97509 Non-pressure chronic ulcer of other part of unspecified foot with unspecified severity: Secondary | ICD-10-CM | POA: Insufficient documentation

## 2011-11-05 DIAGNOSIS — L84 Corns and callosities: Secondary | ICD-10-CM | POA: Insufficient documentation

## 2011-11-05 DIAGNOSIS — E1169 Type 2 diabetes mellitus with other specified complication: Secondary | ICD-10-CM | POA: Insufficient documentation

## 2011-11-12 ENCOUNTER — Encounter (HOSPITAL_BASED_OUTPATIENT_CLINIC_OR_DEPARTMENT_OTHER): Payer: 59

## 2011-11-21 ENCOUNTER — Encounter (HOSPITAL_BASED_OUTPATIENT_CLINIC_OR_DEPARTMENT_OTHER): Payer: 59

## 2011-12-05 ENCOUNTER — Encounter (HOSPITAL_BASED_OUTPATIENT_CLINIC_OR_DEPARTMENT_OTHER): Payer: 59

## 2011-12-24 ENCOUNTER — Other Ambulatory Visit (HOSPITAL_BASED_OUTPATIENT_CLINIC_OR_DEPARTMENT_OTHER): Payer: Self-pay | Admitting: General Surgery

## 2011-12-24 ENCOUNTER — Encounter (HOSPITAL_BASED_OUTPATIENT_CLINIC_OR_DEPARTMENT_OTHER): Payer: 59 | Attending: General Surgery

## 2011-12-24 DIAGNOSIS — L97509 Non-pressure chronic ulcer of other part of unspecified foot with unspecified severity: Secondary | ICD-10-CM | POA: Insufficient documentation

## 2011-12-24 DIAGNOSIS — E1169 Type 2 diabetes mellitus with other specified complication: Secondary | ICD-10-CM | POA: Insufficient documentation

## 2011-12-24 DIAGNOSIS — M869 Osteomyelitis, unspecified: Secondary | ICD-10-CM

## 2011-12-24 DIAGNOSIS — E1149 Type 2 diabetes mellitus with other diabetic neurological complication: Secondary | ICD-10-CM | POA: Insufficient documentation

## 2011-12-26 ENCOUNTER — Encounter (HOSPITAL_BASED_OUTPATIENT_CLINIC_OR_DEPARTMENT_OTHER): Payer: 59

## 2011-12-27 ENCOUNTER — Inpatient Hospital Stay (HOSPITAL_COMMUNITY)
Admission: RE | Admit: 2011-12-27 | Discharge: 2011-12-27 | Payer: 59 | Source: Ambulatory Visit | Attending: General Surgery | Admitting: General Surgery

## 2011-12-27 ENCOUNTER — Other Ambulatory Visit (HOSPITAL_COMMUNITY): Payer: 59

## 2011-12-27 DIAGNOSIS — M869 Osteomyelitis, unspecified: Secondary | ICD-10-CM

## 2012-01-02 ENCOUNTER — Other Ambulatory Visit (HOSPITAL_COMMUNITY): Payer: 59

## 2012-01-02 ENCOUNTER — Inpatient Hospital Stay (HOSPITAL_COMMUNITY): Admission: RE | Admit: 2012-01-02 | Payer: 59 | Source: Ambulatory Visit
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
## 2012-01-06 ENCOUNTER — Ambulatory Visit (HOSPITAL_COMMUNITY): Payer: 59

## 2012-01-06 ENCOUNTER — Ambulatory Visit (HOSPITAL_COMMUNITY): Admission: RE | Admit: 2012-01-06 | Payer: 59 | Source: Ambulatory Visit

## 2012-01-08 ENCOUNTER — Encounter (HOSPITAL_BASED_OUTPATIENT_CLINIC_OR_DEPARTMENT_OTHER): Payer: 59 | Attending: General Surgery

## 2012-01-08 DIAGNOSIS — E1169 Type 2 diabetes mellitus with other specified complication: Secondary | ICD-10-CM | POA: Insufficient documentation

## 2012-01-08 DIAGNOSIS — L97509 Non-pressure chronic ulcer of other part of unspecified foot with unspecified severity: Secondary | ICD-10-CM | POA: Insufficient documentation

## 2012-01-09 ENCOUNTER — Encounter (HOSPITAL_BASED_OUTPATIENT_CLINIC_OR_DEPARTMENT_OTHER): Payer: 59

## 2012-01-20 ENCOUNTER — Emergency Department (HOSPITAL_COMMUNITY): Payer: 59

## 2012-01-20 ENCOUNTER — Encounter (HOSPITAL_COMMUNITY): Payer: Self-pay | Admitting: Anesthesiology

## 2012-01-20 ENCOUNTER — Encounter (HOSPITAL_COMMUNITY): Payer: Self-pay | Admitting: Emergency Medicine

## 2012-01-20 ENCOUNTER — Ambulatory Visit (HOSPITAL_COMMUNITY)
Admission: EM | Admit: 2012-01-20 | Discharge: 2012-01-20 | Disposition: A | Discharge status: Home / Self Care | Payer: 59 | Attending: Emergency Medicine | Admitting: Emergency Medicine

## 2012-01-20 ENCOUNTER — Other Ambulatory Visit (HOSPITAL_COMMUNITY): Payer: Self-pay | Admitting: Pharmacy Technician

## 2012-01-20 ENCOUNTER — Encounter (HOSPITAL_COMMUNITY)
Admission: EM | Disposition: A | Discharge status: Home / Self Care | Payer: Self-pay | Source: Home / Self Care | Attending: Emergency Medicine

## 2012-01-20 ENCOUNTER — Emergency Department (HOSPITAL_COMMUNITY): Payer: 59 | Admitting: Anesthesiology

## 2012-01-20 DIAGNOSIS — X58XXXA Exposure to other specified factors, initial encounter: Secondary | ICD-10-CM | POA: Insufficient documentation

## 2012-01-20 DIAGNOSIS — Z96649 Presence of unspecified artificial hip joint: Secondary | ICD-10-CM | POA: Insufficient documentation

## 2012-01-20 DIAGNOSIS — Z79899 Other long term (current) drug therapy: Secondary | ICD-10-CM | POA: Insufficient documentation

## 2012-01-20 DIAGNOSIS — E119 Type 2 diabetes mellitus without complications: Secondary | ICD-10-CM | POA: Insufficient documentation

## 2012-01-20 DIAGNOSIS — I10 Essential (primary) hypertension: Secondary | ICD-10-CM | POA: Insufficient documentation

## 2012-01-20 DIAGNOSIS — T84029A Dislocation of unspecified internal joint prosthesis, initial encounter: Secondary | ICD-10-CM | POA: Insufficient documentation

## 2012-01-20 HISTORY — PX: HIP CLOSED REDUCTION: SHX983

## 2012-01-20 HISTORY — DX: Essential (primary) hypertension: I10

## 2012-01-20 LAB — GLUCOSE, CAPILLARY: Glucose-Capillary: 72 mg/dL (ref 70–99)

## 2012-01-20 SURGERY — CLOSED MANIPULATION, JOINT, HIP
Anesthesia: Choice | Laterality: Right | Wound class: Clean

## 2012-01-20 MED ORDER — ACETAMINOPHEN 10 MG/ML IV SOLN
INTRAVENOUS | Status: DC | PRN
  Administered 2012-01-20: 1000 mg via INTRAVENOUS

## 2012-01-20 MED ORDER — FENTANYL CITRATE 0.05 MG/ML IJ SOLN
INTRAMUSCULAR | Status: AC
  Administered 2012-01-20: 19:00:00
  Filled 2012-01-20: qty 2

## 2012-01-20 MED ORDER — FENTANYL CITRATE 0.05 MG/ML IJ SOLN: 25.0000 ug | INTRAMUSCULAR | Status: DC | PRN

## 2012-01-20 MED ORDER — PROPOFOL 10 MG/ML IV BOLUS
INTRAVENOUS | Status: DC | PRN
  Administered 2012-01-20: 200 mg via INTRAVENOUS

## 2012-01-20 MED ORDER — SUCCINYLCHOLINE CHLORIDE 20 MG/ML IJ SOLN
INTRAMUSCULAR | Status: DC | PRN
  Administered 2012-01-20: 60 mg via INTRAVENOUS

## 2012-01-20 MED ORDER — PROMETHAZINE HCL 25 MG/ML IJ SOLN: 6.2500 mg | INTRAMUSCULAR | Status: DC | PRN

## 2012-01-20 MED ORDER — ACETAMINOPHEN 10 MG/ML IV SOLN
INTRAVENOUS | Status: AC
  Filled 2012-01-20: qty 100

## 2012-01-20 MED ORDER — LACTATED RINGERS IV SOLN: INTRAVENOUS | Status: DC

## 2012-01-20 MED ORDER — ONDANSETRON HCL 4 MG/2ML IJ SOLN
INTRAMUSCULAR | Status: AC
  Administered 2012-01-20: 17:00:00
  Filled 2012-01-20: qty 2

## 2012-01-20 MED ORDER — FENTANYL CITRATE 0.05 MG/ML IJ SOLN
INTRAMUSCULAR | Status: AC
  Filled 2012-01-20: qty 2

## 2012-01-20 MED ORDER — FENTANYL CITRATE 0.05 MG/ML IJ SOLN
50.0000 ug | Freq: Once | INTRAMUSCULAR | Status: AC
  Administered 2012-01-20: 50 ug via INTRAVENOUS

## 2012-01-20 MED ORDER — LIDOCAINE HCL (CARDIAC) 20 MG/ML IV SOLN
INTRAVENOUS | Status: DC | PRN
  Administered 2012-01-20: 100 mg via INTRAVENOUS

## 2012-01-20 MED ORDER — LACTATED RINGERS IV SOLN
INTRAVENOUS | Status: DC | PRN
  Administered 2012-01-20: 20:00:00 via INTRAVENOUS

## 2012-01-20 SURGICAL SUPPLY — 2 items
IMMOBILIZER KNEE 22  40 CIR (ORTHOPEDIC SUPPLIES) ×1
IMMOBILIZER KNEE 22 40 CIR (ORTHOPEDIC SUPPLIES) IMPLANT

## 2012-01-20 NOTE — ED Provider Notes (Signed)
History     CSN: [id number]  Arrival date & time 01/20/12  1710   First MD Initiated Contact with Patient 01/20/12 1714      Chief Complaint  Patient presents with  . Dislocation    HPI The patient presents with right hip pain.  He notes that just prior to arrival the patient and a slightly awkward motion and felt the sudden onset of pain in his right hip.  Notably, the patient has a history of hip replacement.  He also has history of prior dislocation of the replaced hip, 4 months ago.  Since that time he has been in his usual state of health.  He notes that since today's event he has had persistent "discomfort" in the area.  The sensation is worse with motion, better with analgesics.  No distal new loss of sensation, though the patient is diabetic and has neuropathy. Past Medical History  Diagnosis Date  . Diabetes mellitus   . Hypertension     Past Surgical History  Procedure Date  . Hip surgery     No family history on file.  History  Substance Use Topics  . Smoking status: Not on file  . Smokeless tobacco: Not on file  . Alcohol Use:       Review of Systems  All other systems reviewed and are negative.    Allergies  Prednisone  Home Medications   Current Outpatient Rx  Name Route Sig Dispense Refill  . AMLODIPINE BESYLATE 5 MG PO TABS Oral Take 5 mg by mouth daily.    . INDOMETHACIN 25 MG PO CAPS Oral Take 25-50 mg by mouth 3 (three) times daily as needed. For gout flare ups    . LEVOFLOXACIN 500 MG PO TABS Oral Take 500 mg by mouth daily.    Marland Kitchen LISINOPRIL 40 MG PO TABS Oral Take 40 mg by mouth daily.    Marland Kitchen METFORMIN HCL 1000 MG PO TABS Oral Take 1,000 mg by mouth 2 (two) times daily with a meal.    . TRAMADOL HCL 50 MG PO TABS Oral Take 50 mg by mouth every 6 (six) hours as needed. For pain relief    . INSULIN GLARGINE 100 UNIT/ML [place] SOLN Subcutaneous Inject 12 Units into the skin at bedtime.      BP 167/93  Pulse 80  Temp(Src) 97.7 F (36.5 C) (Oral)   Resp 18  SpO2 100%  Physical Exam  Nursing note and vitals reviewed. Constitutional: He is oriented to person, place, and time. He appears well-developed. No distress.  HENT:  Head: Normocephalic and atraumatic.  Eyes: Conjunctivae and EOM are normal.  Cardiovascular: Normal rate and regular rhythm.   Pulmonary/Chest: Effort normal. No stridor. No respiratory distress.  Abdominal: He exhibits no distension.  Musculoskeletal:       Grossly displaced R hip.  Inability to elevate R leg 2/2 pain.  Neurological: He is alert and oriented to person, place, and time.  Skin: Skin is warm and dry.  Psychiatric: He has a normal mood and affect.    ED Course  Procedures (including critical care time)  Labs Reviewed - No data to display Dg Hip Complete Right  01/20/2012  *RADIOLOGY REPORT*  Clinical Data: The  RIGHT HIP - COMPLETE 2+ VIEW  Comparison:  Right hip films of 09/24/2011  Findings: There is dislocation of the right total hip replacement with the femoral component superior and lateral to the acetabular component.  No acute fracture is seen.  The  bones are osteopenic. The left total hip replacement is in good position.  IMPRESSION: Dislocation of the right total hip replacement as described above. No acute fracture.  Original Report Authenticated By: Juline Patch, M.D.   XR reviewed by me.  No diagnosis found.    MDM  This generally well male presents with right hip pain following an awkward motion.  The patient's history of prior replacement as well as prior dislocation is concerning.  On exam the patient has a gross deformity which is consistent with his x-ray.  The patient's case was discussed with orthopedics.  The patient will have reduction done in the operating room.        Gerhard Munch, MD 01/20/12 1904

## 2012-01-20 NOTE — Op Note (Signed)
Preop diagnosis dislocated right total hip arthroplasty Operative diagnosis same Procedure closed reduction of dislocated total hip arthroplasty exam under anesthesia fluoroscopy Surgeon Valma Cava M.D. Anesthesia Gen. Complications none Disposition PACU stable Operative details Patient was counseled in the holding area cracks that was marked and signed appropriately. His date operating room placed under general anesthesia placed on to the OR bed. Time out was done and confirmed by all involved and room Eliza gentle longitudinal traction and slight flexion with internal and excellent patient a palpable and normal reduction was obtained lightly for symmetric vascular examination unchanged exam under anesthesia at loss to revealed it to be stable to 90 of flexion with 30 of internal and extra rotation. Knee immobilizer was applied he was awakened and taken to the operating room to the PACU in stable condition He we stabilized the PACU and discharge to home allowed to weight-bear as tolerated keep the knee Immobilizer in Pl., Norco and Robaxin will be dispensed and he will followup in the office with Dr. [name] in a few days

## 2012-01-20 NOTE — Anesthesia Postprocedure Evaluation (Signed)
  Anesthesia Post-op Note  Patient: Wesley Burns  Procedure(s) Performed: Procedure(s) (LRB): CLOSED MANIPULATION HIP (Right)  Patient Location: PACU  Anesthesia Type: General  Level of Consciousness: awake and alert   Airway and Oxygen Therapy: Patient Spontanous Breathing  Post-op Pain: mild  Post-op Assessment: Post-op Vital signs reviewed, Patient's Cardiovascular Status Stable, Respiratory Function Stable, Patent Airway and No signs of Nausea or vomiting  Post-op Vital Signs: stable  Complications: No apparent anesthesia complications

## 2012-01-20 NOTE — ED Notes (Signed)
[id number]<AV> Expected date:<BR> Expected time: 4:56 PM<BR> Means of arrival:<BR> Comments:<BR> M40 -45yoM Hip popped out, hx of same.

## 2012-01-20 NOTE — ED Notes (Signed)
Per EMS, pt had hip surgery in October of last year-was changing positions and hip became displaced

## 2012-01-20 NOTE — Anesthesia Preprocedure Evaluation (Addendum)
Anesthesia Evaluation  Patient identified by MRN, date of birth, ID band Patient awake    Reviewed: Allergy & Precautions, H&P , NPO status , Patient's Chart, lab work & pertinent test results  Airway Mallampati: II TM Distance: >3 FB Neck ROM: Full    Dental No notable dental hx.    Pulmonary neg pulmonary ROS,  clear to auscultation  Pulmonary exam normal       Cardiovascular hypertension, Pt. on medications Regular Normal    Neuro/Psych Negative Neurological ROS  Negative Psych ROS   GI/Hepatic negative GI ROS, Neg liver ROS,   Endo/Other  Diabetes mellitus-, Type 2, Insulin Dependent  Renal/GU negative Renal ROS  Genitourinary negative   Musculoskeletal negative musculoskeletal ROS (+)   Abdominal   Peds negative pediatric ROS (+)  Hematology negative hematology ROS (+)   Anesthesia Other Findings   Reproductive/Obstetrics negative OB ROS                           Anesthesia Physical Anesthesia Plan  ASA: II  Anesthesia Plan: General   Post-op Pain Management:    Induction: Intravenous  Airway Management Planned: LMA  Additional Equipment:   Intra-op Plan:   Post-operative Plan: Extubation in OR  Informed Consent: I have reviewed the patients History and Physical, chart, labs and discussed the procedure including the risks, benefits and alternatives for the proposed anesthesia with the patient or authorized representative who has indicated his/her understanding and acceptance.   Dental advisory given  Plan Discussed with: CRNA  Anesthesia Plan Comments: (NPO for food after 1900 last night.)      Anesthesia Quick Evaluation

## 2012-01-20 NOTE — ED Notes (Signed)
Per EMS-received 150 mcg of fentanyl and 4mg  of Zofran
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
## 2012-01-20 NOTE — Preoperative (Signed)
Beta Blockers   Reason not to administer Beta Blockers:Not Applicable 

## 2012-01-20 NOTE — Transfer of Care (Signed)
Immediate Anesthesia Transfer of Care Note  Patient: Wesley Burns  Procedure(s) Performed: Procedure(s) (LRB): CLOSED MANIPULATION HIP (Right)  Patient Location: PACU  Anesthesia Type: General  Level of Consciousness: awake, alert  and oriented  Airway & Oxygen Therapy: Patient Spontanous Breathing and Patient connected to face mask oxygen  Post-op Assessment: Report given to PACU RN and Post -op Vital signs reviewed and stable  Post vital signs: Reviewed and stable  Complications: No apparent anesthesia complications

## 2012-01-28 ENCOUNTER — Encounter (HOSPITAL_BASED_OUTPATIENT_CLINIC_OR_DEPARTMENT_OTHER): Payer: 59

## 2012-01-30 NOTE — Progress Notes (Signed)
[facility]  NAME:  Wesley Burns, Wesley Burns NO.:  MEDICAL RECORD NO.:  [id number]      DATE OF BIRTH:  January 04, 1966  PHYSICIAN:  Wayland Denis, DO       VISIT DATE:  01/29/2012                                  OFFICE VISIT   The patient is a 46 year old gentleman who is here for followup on bilateral plantar wounds.  He is with diabetic foot ulcers.  He has been dealing with these for sometime now.  He has been using Santyl and collagen over the last couple of weeks.  He had some increased redness in his left foot, the secondary fourth toe is bit concerning.  He was unable to get his vascular or MRI studies as we had ordered due to different issues.  We talked to him about that and reiterated the importance of getting them.  We are going to continue with his Levaquin that he was started on previously and have strongly recommended that he continue with getting those studies.  PHYSICAL EXAMINATION:  He is alert, oriented, cooperative, not in any acute distress.  He is pleasant.  Pupils are equal.  Extraocular muscles are intact.  No cervical lymphadenopathy.  His breathing is unlabored. His heart is regular.  His abdomen is soft.  The wounds are as noted above and described as well as in the chart.  We will continue with the Santyl, collagen, and have him follow up in a week and certainly get the studies as previously indicated.     Wayland Denis, DO     CS/MEDQ  D:  01/29/2012  T:  01/30/2012  Job:  [id number]

## 2012-02-03 ENCOUNTER — Encounter (HOSPITAL_COMMUNITY): Payer: Self-pay | Admitting: Specialist

## 2012-02-05 ENCOUNTER — Encounter (HOSPITAL_BASED_OUTPATIENT_CLINIC_OR_DEPARTMENT_OTHER): Payer: 59 | Attending: Plastic Surgery

## 2012-02-05 DIAGNOSIS — L97509 Non-pressure chronic ulcer of other part of unspecified foot with unspecified severity: Secondary | ICD-10-CM | POA: Insufficient documentation

## 2012-02-05 DIAGNOSIS — E1169 Type 2 diabetes mellitus with other specified complication: Secondary | ICD-10-CM | POA: Insufficient documentation

## 2012-02-05 DIAGNOSIS — E1149 Type 2 diabetes mellitus with other diabetic neurological complication: Secondary | ICD-10-CM | POA: Insufficient documentation

## 2012-02-07 ENCOUNTER — Ambulatory Visit (HOSPITAL_COMMUNITY)
Admission: RE | Admit: 2012-02-07 | Discharge: 2012-02-07 | Disposition: A | Discharge status: Home / Self Care | Payer: 59 | Source: Ambulatory Visit | Attending: General Surgery | Admitting: General Surgery

## 2012-02-07 ENCOUNTER — Other Ambulatory Visit (HOSPITAL_BASED_OUTPATIENT_CLINIC_OR_DEPARTMENT_OTHER): Payer: Self-pay | Admitting: General Surgery

## 2012-02-07 ENCOUNTER — Ambulatory Visit (HOSPITAL_COMMUNITY): Admission: RE | Admit: 2012-02-07 | Payer: 59 | Source: Ambulatory Visit

## 2012-02-07 DIAGNOSIS — L03119 Cellulitis of unspecified part of limb: Secondary | ICD-10-CM | POA: Insufficient documentation

## 2012-02-07 DIAGNOSIS — M869 Osteomyelitis, unspecified: Secondary | ICD-10-CM

## 2012-02-07 DIAGNOSIS — IMO0001 Reserved for inherently not codable concepts without codable children: Secondary | ICD-10-CM | POA: Insufficient documentation

## 2012-02-07 DIAGNOSIS — L02619 Cutaneous abscess of unspecified foot: Secondary | ICD-10-CM | POA: Insufficient documentation

## 2012-02-07 DIAGNOSIS — L97509 Non-pressure chronic ulcer of other part of unspecified foot with unspecified severity: Secondary | ICD-10-CM | POA: Insufficient documentation

## 2012-02-13 ENCOUNTER — Inpatient Hospital Stay (HOSPITAL_COMMUNITY): Admission: RE | Admit: 2012-02-13 | Payer: 59 | Source: Ambulatory Visit

## 2012-02-13 NOTE — Progress Notes (Signed)
[facility]  NAME:  Wesley Burns, Wesley Burns NO.:  [id number]  MEDICAL RECORD NO.:  [id number]      DATE OF BIRTH:  10/30/1966  PHYSICIAN:  Wayland Denis, DO       VISIT DATE:  02/12/2012                                  OFFICE VISIT   The patient is a 46 year old gentleman who is being followed for bilateral plantar ulcers with severe Charcot foot deformity.  He underwent the MRI for his left foot but was unable to have it done on his right and he will have that done tomorrow.  The MRI showed severe Charcot changes including the midfoot.  No definitive MR findings for osteomyelitis but there was plantar forefoot ulcer with a local cellulitis and myositis with possible septic tenosynovitis.  It does not appear to be septic at this time on the left but this is certainly concerning.  We are going to get that MRI for the right and he is going to continue with the antibiotics. No other change in his medications.  SOCIAL HISTORY:  Unchanged.  REVIEW OF SYSTEMS:  Otherwise negative.  PHYSICAL EXAMINATION:  GENERAL:  He is alert, oriented, cooperative, not in any acute distress.  He is very pleasant. HEENT:  Pupils are equal.  Extraocular muscles are intact. NECK:  No cervical lymphadenopathy. LUNGS:  His breathing is unlabored. HEART:  Regular. ABDOMEN:  Soft. EXTREMITIES:  The wounds are as noted above.  No significant change from last week.  He does have red, dry skin on the dorsal aspect of his right foot.  We will continue with the alginate TCA for the skin and have him follow up in 1 week.  We will also refer him to Dr. Victorino Dike as some of these pressures may be inhibiting the healing of the soft tissue over them.     Wayland Denis, DO     CS/MEDQ  D:  02/12/2012  T:  02/13/2012  Job:  [phone] [id number]

## 2012-03-04 ENCOUNTER — Encounter (HOSPITAL_BASED_OUTPATIENT_CLINIC_OR_DEPARTMENT_OTHER): Payer: 59

## 2012-04-06 ENCOUNTER — Other Ambulatory Visit: Payer: Self-pay | Admitting: Family Medicine

## 2012-04-06 ENCOUNTER — Ambulatory Visit (INDEPENDENT_AMBULATORY_CARE_PROVIDER_SITE_OTHER): Payer: 59 | Admitting: Family Medicine

## 2012-04-06 ENCOUNTER — Encounter: Payer: Self-pay | Admitting: Family Medicine

## 2012-04-06 VITALS — BP 150/92 | HR 84 | Ht 71.0 in | Wt 157.0 lb

## 2012-04-06 DIAGNOSIS — L97509 Non-pressure chronic ulcer of other part of unspecified foot with unspecified severity: Secondary | ICD-10-CM

## 2012-04-06 DIAGNOSIS — E1169 Type 2 diabetes mellitus with other specified complication: Secondary | ICD-10-CM

## 2012-04-06 DIAGNOSIS — I10 Essential (primary) hypertension: Secondary | ICD-10-CM | POA: Insufficient documentation

## 2012-04-06 LAB — CBC WITH DIFFERENTIAL/PLATELET
Basophils Absolute: 0.1 10*3/uL (ref 0.0–0.1)
Eosinophils Relative: 8 % — ABNORMAL HIGH (ref 0–5)
HCT: 48 % (ref 39.0–52.0)
Hemoglobin: 16.8 g/dL (ref 13.0–17.0)
Lymphocytes Relative: 30 % (ref 12–46)
Lymphs Abs: 2.1 10*3/uL (ref 0.7–4.0)
MCV: 96.8 fL (ref 78.0–100.0)
Monocytes Absolute: 0.8 10*3/uL (ref 0.1–1.0)
Monocytes Relative: 11 % (ref 3–12)
Neutro Abs: 3.5 10*3/uL (ref 1.7–7.7)
RBC: 4.96 MIL/uL (ref 4.22–5.81)
WBC: 7 10*3/uL (ref 4.0–10.5)

## 2012-04-06 MED ORDER — LISINOPRIL 40 MG PO TABS: 40.0000 mg | ORAL_TABLET | Freq: Every day | ORAL | Status: DC

## 2012-04-06 MED ORDER — METFORMIN HCL 1000 MG PO TABS: 1000.0000 mg | ORAL_TABLET | Freq: Two times a day (BID) | ORAL | Status: DC

## 2012-04-06 MED ORDER — INSULIN GLARGINE 100 UNIT/ML ~~LOC~~ SOLN: 12.0000 [IU] | Freq: Every day | SUBCUTANEOUS | Status: DC

## 2012-04-06 NOTE — Patient Instructions (Signed)
Please quit smoking--this impairs your wound healing and puts you risk for further complications of ulcers. Please cut back on alcohol intake--no more than 1-2 drinks daily.  Check blood sugars twice daily once meds are restarted--since your sugars don't seem high off the medications, I'm concerned about low blood sugars when insulin is restarted.  Please schedule appointment with eye doctor (ophthalmologist, not optometrist)--  You may increase your use of hydrocortisone cream to 2-3 times daily.  You need to get routine foot care from podiatrist--your curled toes have caused callouses to form that are pre-ulcerative--this means they can turn into foot ulcers if not properly cared for.  Please periodically check your Blood pressures.  Goal blood pressure is <130/80.  Hopefully your BP will be improved once the lisinopril is restarted.  You will need to return in 1 month to follow up on sugars, and blood pressure.  We will try and arrange for a consult for wound care elsewhere--if this takes a while, please follow up with the podiatrist in the interim.

## 2012-04-06 NOTE — Progress Notes (Signed)
Chief complaint: ulcers on both feet, right worse than left. Had been going to [facility] for about 3 years but felt that they were not helping him  HPI:  Patient is here to establish care.  He was under the care of Dr. Jillyn Hidden, but hasn't seen her in many months.  Apparently she wouldn't refill meds (because he was due for appt), so has been out of his medications for over a month (metformin, lantus, lisinopril).  Only on keflex, as prescribed by wound care, amlodipine and hydrocodone as needed.  Also out of indocin, prn for gout, but not flaring now.  Never took cholesterol-lowering meds.  DM:  Sugars were running 120's-130 prior to running out of medication.  Sugar was 145 today. Admits that he hasn't been checking it very often since he ran out of meds.  Last A1c was okay per pt, he can't recall the number, and doesn't remember when it was.  Not done through the hospital, likely done through Dr. Jillyn Hidden.  Last saw her about 5 months ago. Recalls that his potassium was very low at his last check.  Recheck was okay.  Has seen Dr. Sharl Ma in the past, but not since he moved offices. Denies polydipsia, polyuria.  ophtho 11/2011--apparently just saw optometrist for glasses, was told they saw an abnormality, but was supposed to get called with an appointment and no one called. First diagnosed with DM after being hospitalized with an infection in L foot.  Had been "borderline" diabetic in years prior to that, never treated with meds.  Has been on insulin since that time.   Previously saw Podiatrist, although not in quite a while.  Has been under the care of wound care, not seen in about a month.  Was going weekly, but wasn't healing, so stopped going, got frustrated. Wasn't seeing improvement in his ulcers/wounds.  Asking for referral to another wound care clinic.  Developed a rash on the top of his feet, started while still at wound care (about 3 months ago).  Using cortisone-10, using it only when he changes the  dressing about every 3 days.  Usually wraps with gauze, not any tape, as it was though perhaps tape was contributing to the rash.  No improvement with stopping using tape (has it taped now).  Not really itchy, as his feet are fairly numb. Rash tends to weep a yellowish fluid.  Also having a lot of weeping from the ulcer on the R foot.  Ulcer on R foot, under great toe is large, the L toe ulcer is smaller.  Has Charcot joint in left foot.  Denies any pain.  Has decreased sensation, but no significant tingling or burning pain.    Had MRI L foot, and reports no infection.  R foot ulcer is worse/larger, but unable to get MRI of R foot done--couldn't keep leg from twitching No osteomyelitis seen in L. Hasn't rescheduled for MRI of R foot yet.  Wants "to start from scratch" with new primary care doctor and new wound care clinic.  Past Medical History  Diagnosis Date  . Diabetes mellitus   . Hypertension   . Diabetic foot ulcers     bilateral  . Charcot's joint     L foot  . 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 Gout     Past Surgical History  Procedure Date  . Hip surgery   . Hip closed reduction 01/20/2012    Procedure: CLOSED MANIPULATION  HIP;  Surgeon: Erasmo Leventhal, MD;  Location: [facility];  Service: Orthopedics;  Laterality: Right;  closed reduction right total dislocated hip  . Total hip arthroplasty     bilateral  . Foot surgery 2011    left--for infection L foot    History   Social History  . Marital Status: Single    Spouse Name: N/A    Number of Children: N/A  . Years of Education: N/A   Occupational History  . merchandiser (textiles)    Social History Main Topics  . Smoking status: Current Some Day Smoker -- 0.1 packs/day for 25 years    Types: Cigarettes  . Smokeless tobacco: Not on file  . Alcohol Use: Yes     3 drinks per day. (mixed drinks)  . Drug Use: No  . Sexually Active: Not on file   Other Topics Concern  . Not on file    Social History Narrative   Lives with a friend, dog, cat    Family History  Problem Relation Age of Onset  . Arthritis Mother     rheumatoid  . Diabetes Father   . Hypertension Father     Current outpatient prescriptions:amLODipine (NORVASC) 5 MG tablet, Take 5 mg by mouth daily., Disp: , Rfl: ;  cephALEXin (KEFLEX) 500 MG capsule, Take 500 mg by mouth 3 (three) times daily., Disp: , Rfl: ;  HYDROcodone-acetaminophen (NORCO) 5-325 MG per tablet, Take 1 tablet by mouth every 6 (six) hours as needed., Disp: , Rfl:  indomethacin (INDOCIN) 25 MG capsule, Take 25-50 mg by mouth 3 (three) times daily as needed. For gout flare ups, Disp: , Rfl: ;  insulin glargine (LANTUS SOLOSTAR) 100 UNIT/ML injection, Inject 12 Units into the skin at bedtime., Disp: 9 mL, Rfl: 0;  lisinopril (PRINIVIL,ZESTRIL) 40 MG tablet, Take 1 tablet (40 mg total) by mouth daily., Disp: 30 tablet, Rfl: 0 metFORMIN (GLUCOPHAGE) 1000 MG tablet, Take 1 tablet (1,000 mg total) by mouth 2 (two) times daily with a meal., Disp: 60 tablet, Rfl: 0;  DISCONTD: lisinopril (PRINIVIL,ZESTRIL) 40 MG tablet, Take 40 mg by mouth daily., Disp: , Rfl: ;  DISCONTD: metFORMIN (GLUCOPHAGE) 1000 MG tablet, Take 1,000 mg by mouth 2 (two) times daily with a meal., Disp: , Rfl:   Allergies  Allergen Reactions  . Prednisone Hives, Nausea And Vomiting and Swelling   ROS:  Denies headaches, fevers, chest pain, shortness of breath. Vision change, URI symptoms, GI complaints, GU complaints, psych complaints, or other problems.  PHYSICAL EXAM: BP 150/92  Pulse 84  Ht 5\' 11"  (1.803 m)  Wt 157 lb (71.215 kg)  BMI 21.90 kg/m2 Well developed, thin male, in no distress HEENT:  Conjunctiva clear.  PERRL, EOMI.   Neck: no lymphadenopathy, thyromegaly or bruit Heart: regular rate and rhythm without murmur Lungs: clear bilaterally Back: no spine or CVA tenderness Abdomen: soft, nontender, no mass Extremities: Feet are wrapped, covering ulcers (at 1st  MTP's).  Minimal drainage on bandages, no foul odor or purulent.  Significant deformity to mid-feet bilaterally, as well has deformity of toes.  Pre-ulcerative callous L 2nd and 3rd toe.  Decreased sensation at toes bilaterally, and some decreased sensation medial L foot  ASSESSMENT/PLAN: 1. Type II or unspecified type diabetes mellitus without mention of complication, uncontrolled  metFORMIN (GLUCOPHAGE) 1000 MG tablet, insulin glargine (LANTUS SOLOSTAR) 100 UNIT/ML injection, Comprehensive metabolic panel, Hemoglobin A1c, Microalbumin / creatinine urine ratio  2. Diabetic foot ulcer  CBC with Differential  3. Essential  hypertension, benign  lisinopril (PRINIVIL,ZESTRIL) 40 MG tablet   46 yo male, who has been noncompliant with follow-up with PCP, treatment of DM, and f/u with wound clinic for his bilateral ulcers. He continues to smoke, and drinks more than recommended. Discussed risks of poorly controlled diabetes, ongoing smoking, and noncompliance.  Encouraged him to get routine foot care as he has other pre-ulcerative lesions. We discussed the need to have ongoing woundcare--will look into other woundcare clinics per patient request.  Will restart meds that he has run out of, and needs to f/u within a month to see if BP and diabetes are adequately controlled.  Will need to review [facility] records (?if lipids due, other issues need to f/u).  A1c, CBC, chem panel, urine microalbumin, uric acid.  Refill meds x 1 month.  Schedule OV 1 month, and will get [facility] records in interim.  Need to check immunizations--doesn't think he got any from [place]. Likely will need Tdap and pneumovax.   WOUND CARE CLINIC--not   ADD ON URIC ACID TO LABS DRAWN

## 2012-04-07 LAB — COMPREHENSIVE METABOLIC PANEL
ALT: 31 U/L (ref 0–53)
BUN: 6 mg/dL (ref 6–23)
CO2: 29 mEq/L (ref 19–32)
Calcium: 9.1 mg/dL (ref 8.4–10.5)
Chloride: 97 mEq/L (ref 96–112)
Creat: 0.81 mg/dL (ref 0.50–1.35)
Total Bilirubin: 0.8 mg/dL (ref 0.3–1.2)

## 2012-04-07 LAB — MICROALBUMIN / CREATININE URINE RATIO
Creatinine, Urine: 255.3 mg/dL
Microalb Creat Ratio: 117.3 mg/g — ABNORMAL HIGH (ref 0.0–30.0)
Microalb, Ur: 29.95 mg/dL — ABNORMAL HIGH (ref 0.00–1.89)

## 2012-04-07 LAB — HEMOGLOBIN A1C: Mean Plasma Glucose: 105 mg/dL (ref <117)

## 2012-04-07 LAB — URIC ACID: Uric Acid, Serum: 8.9 mg/dL — ABNORMAL HIGH (ref 4.0–7.8)

## 2012-04-08 ENCOUNTER — Telehealth: Payer: Self-pay | Admitting: *Deleted

## 2012-04-08 ENCOUNTER — Other Ambulatory Visit: Payer: Self-pay | Admitting: *Deleted

## 2012-04-08 MED ORDER — INDOMETHACIN 25 MG PO CAPS: 25.0000 mg | ORAL_CAPSULE | Freq: Three times a day (TID) | ORAL | Status: DC | PRN

## 2012-04-08 NOTE — Telephone Encounter (Signed)
Noted.  Hopefully he really follows up with [facility] wound [facility]... I did express the importance of follow up at his visit.

## 2012-04-08 NOTE — Telephone Encounter (Signed)
I called referral for wound care to [facility] @ [phone] as there were no other wound care clinics in [place]. When I called patient back before he told me that [place] was definetly too far for him to drive. He stated that he would just go back to [facility]. Just an FYI.

## 2012-04-13 ENCOUNTER — Telehealth: Payer: Self-pay | Admitting: Family Medicine

## 2012-04-13 NOTE — Telephone Encounter (Signed)
Spoke with patient and he does not want to drive to [facility], he will go back to [facility]-called [facility] [place] and spoke to [place] and let her know.

## 2012-04-13 NOTE — Telephone Encounter (Signed)
LM

## 2012-05-06 ENCOUNTER — Ambulatory Visit: Payer: 59 | Admitting: Family Medicine

## 2012-05-07 ENCOUNTER — Telehealth: Payer: Self-pay | Admitting: Family Medicine

## 2012-05-07 DIAGNOSIS — I10 Essential (primary) hypertension: Secondary | ICD-10-CM

## 2012-05-07 MED ORDER — LISINOPRIL 40 MG PO TABS: 40.0000 mg | ORAL_TABLET | Freq: Every day | ORAL | Status: DC

## 2012-05-07 NOTE — Telephone Encounter (Signed)
Sent in 30 day supply of lisinopril 40mg  to pharmacy

## 2012-05-19 ENCOUNTER — Encounter (HOSPITAL_BASED_OUTPATIENT_CLINIC_OR_DEPARTMENT_OTHER): Payer: 59

## 2012-05-21 ENCOUNTER — Ambulatory Visit: Payer: 59 | Admitting: Family Medicine

## 2012-06-16 ENCOUNTER — Encounter (HOSPITAL_BASED_OUTPATIENT_CLINIC_OR_DEPARTMENT_OTHER): Payer: 59

## 2012-07-07 ENCOUNTER — Telehealth: Payer: Self-pay | Admitting: Internal Medicine

## 2012-07-07 DIAGNOSIS — I10 Essential (primary) hypertension: Secondary | ICD-10-CM

## 2012-07-07 MED ORDER — METFORMIN HCL 1000 MG PO TABS: 1000.0000 mg | ORAL_TABLET | Freq: Two times a day (BID) | ORAL | Status: DC

## 2012-07-07 MED ORDER — LISINOPRIL 40 MG PO TABS: 40.0000 mg | ORAL_TABLET | Freq: Every day | ORAL | Status: DC

## 2012-07-07 NOTE — Telephone Encounter (Signed)
Sent the Rx refill to the pharmacy for Metformin and Lisinopril. CLS

## 2012-07-14 ENCOUNTER — Encounter (HOSPITAL_BASED_OUTPATIENT_CLINIC_OR_DEPARTMENT_OTHER): Payer: 59 | Attending: General Surgery

## 2012-07-14 DIAGNOSIS — G589 Mononeuropathy, unspecified: Secondary | ICD-10-CM | POA: Insufficient documentation

## 2012-07-14 DIAGNOSIS — I739 Peripheral vascular disease, unspecified: Secondary | ICD-10-CM | POA: Insufficient documentation

## 2012-07-14 DIAGNOSIS — L97509 Non-pressure chronic ulcer of other part of unspecified foot with unspecified severity: Secondary | ICD-10-CM | POA: Insufficient documentation

## 2012-07-14 DIAGNOSIS — E1149 Type 2 diabetes mellitus with other diabetic neurological complication: Secondary | ICD-10-CM | POA: Insufficient documentation

## 2012-07-14 DIAGNOSIS — Z79899 Other long term (current) drug therapy: Secondary | ICD-10-CM | POA: Insufficient documentation

## 2012-07-14 DIAGNOSIS — I10 Essential (primary) hypertension: Secondary | ICD-10-CM | POA: Insufficient documentation

## 2012-07-14 DIAGNOSIS — E1169 Type 2 diabetes mellitus with other specified complication: Secondary | ICD-10-CM | POA: Insufficient documentation

## 2012-07-14 NOTE — H&P (Signed)
NAMEDONNA, Wesley Burns              ACCOUNT NO.:  [id number]  MEDICAL RECORD NO.:  [id number]  LOCATION:  FOOT                         FACILITY:  [facility]  PHYSICIAN:  Joanne Gavel, M.D.        DATE OF BIRTH:  [birth year]  DATE OF ADMISSION:  07/14/2012 DATE OF DISCHARGE:                             HISTORY & PHYSICAL   CHIEF COMPLAINT:  Diabetic foot ulcers, both feet.  HISTORY OF PRESENT ILLNESS:  The patient has been diabetic for more than 5 years.  His sugar is in very good control.  He has a significant neuropathy however and has had several diabetic foot ulcers on the plantar surface of his feet.  He has had clear Charcot changes for several years.  He was healed approximately year and half ago, but he came back to our clinic with ulcers in March and then stopped keeping his appointments.  He said he had many chores to care for.  He denies pain, redness, tenderness, fevers, chills or sweats.  PAST MEDICAL HISTORY:  Significant for hypertension, peripheral vascular disease, and Charcot foot.  ALLERGIES:  STEROIDS causes hives and shortness of breath.  MEDICATIONS:  Metformin, lisinopril.  Alcohol, cigarettes no.  REVIEW OF SYSTEMS:  Essentially as above.  PHYSICAL EXAMINATION:  VITAL SIGNS:  Temperature 98.9, pulse 90, respirations 18, blood pressure 103/110, glucose is 107. EYES, EARS, NOSE, THROAT:  Normal. CHEST:  Clear. HEART:  Regular rhythm. EXTREMITIES:  Examination of the extremities reveals Charcot deformities of both feet.  There are very good peripheral pulses.  ABI is 1.15 and 1.09.  On the right foot there is a plantar ulcer of 3.5 x 4.0, which is relatively superficial.  On the left foot, there is a plantar ulcer of 1.8 x 0.8 deep with a great deal of undermining heavily surrounded by callus and easily probing to the metatarsal bones.  IMPRESSION:  Diabetic foot ulcer.  The patient has been very cavalier about his treatment, has allowed the situation to  proceed quite a bit. He will probably need HBO.  We have sent him for x-rays.  He seems to be very cavalier about his situation, but he is in danger of tissue loss because of the extent and depth of his wounds particularly on the left foot.  PLAN OF TREATMENT:  Now we will start with Santyl and Hydrogel.     Joanne Gavel, M.D.     RA/MEDQ  D:  07/14/2012  T:  07/14/2012  Job:  [id number]

## 2012-08-11 ENCOUNTER — Encounter (HOSPITAL_BASED_OUTPATIENT_CLINIC_OR_DEPARTMENT_OTHER): Payer: 59

## 2012-08-14 ENCOUNTER — Ambulatory Visit (HOSPITAL_COMMUNITY)
Admission: RE | Admit: 2012-08-14 | Discharge: 2012-08-14 | Disposition: A | Discharge status: Home / Self Care | Payer: 59 | Source: Ambulatory Visit | Attending: General Surgery | Admitting: General Surgery

## 2012-08-14 ENCOUNTER — Encounter (HOSPITAL_BASED_OUTPATIENT_CLINIC_OR_DEPARTMENT_OTHER): Payer: 59 | Attending: General Surgery

## 2012-08-14 ENCOUNTER — Other Ambulatory Visit (HOSPITAL_BASED_OUTPATIENT_CLINIC_OR_DEPARTMENT_OTHER): Payer: Self-pay | Admitting: General Surgery

## 2012-08-14 DIAGNOSIS — T148XXA Other injury of unspecified body region, initial encounter: Secondary | ICD-10-CM

## 2012-08-14 DIAGNOSIS — E1169 Type 2 diabetes mellitus with other specified complication: Secondary | ICD-10-CM | POA: Insufficient documentation

## 2012-08-14 DIAGNOSIS — M7730 Calcaneal spur, unspecified foot: Secondary | ICD-10-CM | POA: Insufficient documentation

## 2012-08-14 DIAGNOSIS — M869 Osteomyelitis, unspecified: Secondary | ICD-10-CM

## 2012-08-14 DIAGNOSIS — L97509 Non-pressure chronic ulcer of other part of unspecified foot with unspecified severity: Secondary | ICD-10-CM | POA: Insufficient documentation

## 2012-09-04 ENCOUNTER — Encounter (HOSPITAL_BASED_OUTPATIENT_CLINIC_OR_DEPARTMENT_OTHER): Payer: 59 | Attending: General Surgery

## 2012-09-04 ENCOUNTER — Other Ambulatory Visit (HOSPITAL_BASED_OUTPATIENT_CLINIC_OR_DEPARTMENT_OTHER): Payer: Self-pay | Admitting: General Surgery

## 2012-09-04 DIAGNOSIS — E1169 Type 2 diabetes mellitus with other specified complication: Secondary | ICD-10-CM | POA: Insufficient documentation

## 2012-09-04 DIAGNOSIS — L97509 Non-pressure chronic ulcer of other part of unspecified foot with unspecified severity: Secondary | ICD-10-CM | POA: Insufficient documentation

## 2012-09-04 DIAGNOSIS — M869 Osteomyelitis, unspecified: Secondary | ICD-10-CM

## 2012-09-09 ENCOUNTER — Ambulatory Visit (HOSPITAL_COMMUNITY)
Admission: RE | Admit: 2012-09-09 | Discharge: 2012-09-09 | Payer: 59 | Source: Ambulatory Visit | Attending: General Surgery | Admitting: General Surgery

## 2012-09-11 ENCOUNTER — Ambulatory Visit (HOSPITAL_COMMUNITY)
Admission: RE | Admit: 2012-09-11 | Discharge: 2012-09-11 | Disposition: A | Discharge status: Home / Self Care | Payer: 59 | Source: Ambulatory Visit | Attending: General Surgery | Admitting: General Surgery

## 2012-09-11 DIAGNOSIS — M2010 Hallux valgus (acquired), unspecified foot: Secondary | ICD-10-CM | POA: Insufficient documentation

## 2012-09-11 DIAGNOSIS — M869 Osteomyelitis, unspecified: Secondary | ICD-10-CM

## 2012-09-11 DIAGNOSIS — M19079 Primary osteoarthritis, unspecified ankle and foot: Secondary | ICD-10-CM | POA: Insufficient documentation

## 2012-09-11 DIAGNOSIS — E119 Type 2 diabetes mellitus without complications: Secondary | ICD-10-CM | POA: Insufficient documentation

## 2012-09-11 DIAGNOSIS — X58XXXA Exposure to other specified factors, initial encounter: Secondary | ICD-10-CM | POA: Insufficient documentation

## 2012-09-11 DIAGNOSIS — S93336A Other dislocation of unspecified foot, initial encounter: Secondary | ICD-10-CM | POA: Insufficient documentation

## 2012-09-11 DIAGNOSIS — M659 Unspecified synovitis and tenosynovitis, unspecified site: Secondary | ICD-10-CM | POA: Insufficient documentation

## 2012-09-11 DIAGNOSIS — L97509 Non-pressure chronic ulcer of other part of unspecified foot with unspecified severity: Secondary | ICD-10-CM | POA: Insufficient documentation

## 2012-09-11 DIAGNOSIS — M65979 Unspecified synovitis and tenosynovitis, unspecified ankle and foot: Secondary | ICD-10-CM | POA: Insufficient documentation

## 2012-09-11 LAB — GLUCOSE, CAPILLARY: Glucose-Capillary: 157 mg/dL — ABNORMAL HIGH (ref 70–99)

## 2012-09-11 MED ORDER — GADOBENATE DIMEGLUMINE 529 MG/ML IV SOLN
15.0000 mL | Freq: Once | INTRAVENOUS | Status: AC | PRN
  Administered 2012-09-11: 15 mL via INTRAVENOUS

## 2012-10-02 ENCOUNTER — Encounter (HOSPITAL_BASED_OUTPATIENT_CLINIC_OR_DEPARTMENT_OTHER): Payer: 59 | Attending: General Surgery

## 2012-10-02 DIAGNOSIS — L97509 Non-pressure chronic ulcer of other part of unspecified foot with unspecified severity: Secondary | ICD-10-CM | POA: Insufficient documentation

## 2012-10-02 DIAGNOSIS — E1149 Type 2 diabetes mellitus with other diabetic neurological complication: Secondary | ICD-10-CM | POA: Insufficient documentation

## 2012-10-02 DIAGNOSIS — E1169 Type 2 diabetes mellitus with other specified complication: Secondary | ICD-10-CM | POA: Insufficient documentation

## 2012-11-06 ENCOUNTER — Other Ambulatory Visit (HOSPITAL_BASED_OUTPATIENT_CLINIC_OR_DEPARTMENT_OTHER): Payer: Self-pay | Admitting: General Surgery

## 2012-11-06 ENCOUNTER — Ambulatory Visit (HOSPITAL_COMMUNITY)
Admission: RE | Admit: 2012-11-06 | Discharge: 2012-11-06 | Disposition: A | Discharge status: Home / Self Care | Payer: 59 | Source: Ambulatory Visit | Attending: General Surgery | Admitting: General Surgery

## 2012-11-06 ENCOUNTER — Encounter (HOSPITAL_BASED_OUTPATIENT_CLINIC_OR_DEPARTMENT_OTHER): Payer: 59 | Attending: General Surgery

## 2012-11-06 DIAGNOSIS — M869 Osteomyelitis, unspecified: Secondary | ICD-10-CM

## 2012-11-06 DIAGNOSIS — E1169 Type 2 diabetes mellitus with other specified complication: Secondary | ICD-10-CM | POA: Insufficient documentation

## 2012-11-06 DIAGNOSIS — L97509 Non-pressure chronic ulcer of other part of unspecified foot with unspecified severity: Secondary | ICD-10-CM | POA: Insufficient documentation

## 2012-11-06 DIAGNOSIS — M7730 Calcaneal spur, unspecified foot: Secondary | ICD-10-CM | POA: Insufficient documentation

## 2012-11-06 DIAGNOSIS — M2010 Hallux valgus (acquired), unspecified foot: Secondary | ICD-10-CM | POA: Insufficient documentation

## 2012-11-06 DIAGNOSIS — X58XXXA Exposure to other specified factors, initial encounter: Secondary | ICD-10-CM | POA: Insufficient documentation

## 2012-11-06 DIAGNOSIS — T1490XA Injury, unspecified, initial encounter: Secondary | ICD-10-CM | POA: Insufficient documentation

## 2012-12-04 ENCOUNTER — Encounter (HOSPITAL_BASED_OUTPATIENT_CLINIC_OR_DEPARTMENT_OTHER): Payer: 59

## 2012-12-15 NOTE — Progress Notes (Signed)
[facility]  NAME:  Wesley Burns, Wesley Burns NO.:  [id number]  MEDICAL RECORD NO.:  [id number]      DATE OF BIRTH:  1966/06/28  PHYSICIAN:  Ardath Sax, M.D.           VISIT DATE:                                  OFFICE VISIT   Wesley Burns is a 47 year old diabetic, who has marked deformities of his feet with Charcot's deformity.  He has [address] III diabetic foot ulcers on the plantar aspect of both of his feet.  They have been diagnosed as [address].  He has had x-rays which show no osteo.  We talked to him for some time about total contact cares and also about Dermagraft and the last time he was here, we put on silver alginate and we also had him on Cipro as the cultures showed some bacteria which was responsive to Cipro.  Since that time he has missed several appointments and we have called him and he has not returned the calls.  Hopefully he will come back, but he certainly has not been responsive to our efforts to contact him, so we are just putting his note in his file so people will know he has not been real faithful in his appointments.     Ardath Sax, M.D.     PP/MEDQ  D:  12/14/2012  T:  12/15/2012  Job:  [id number]

## 2013-04-05 ENCOUNTER — Other Ambulatory Visit (HOSPITAL_COMMUNITY): Payer: Self-pay | Admitting: Orthopedic Surgery

## 2013-04-06 ENCOUNTER — Encounter (HOSPITAL_COMMUNITY): Payer: Self-pay | Admitting: *Deleted

## 2013-04-06 ENCOUNTER — Encounter (HOSPITAL_COMMUNITY): Payer: Self-pay

## 2013-04-06 MED ORDER — CEFAZOLIN SODIUM-DEXTROSE 2-3 GM-% IV SOLR
2.0000 g | INTRAVENOUS | Status: AC
  Administered 2013-04-07: 2 g via INTRAVENOUS
  Filled 2013-04-06: qty 50

## 2013-04-07 ENCOUNTER — Encounter (HOSPITAL_COMMUNITY): Payer: Self-pay | Admitting: Anesthesiology

## 2013-04-07 ENCOUNTER — Ambulatory Visit (HOSPITAL_COMMUNITY): Payer: 59 | Admitting: Anesthesiology

## 2013-04-07 ENCOUNTER — Observation Stay (HOSPITAL_COMMUNITY)
Admission: RE | Admit: 2013-04-07 | Discharge: 2013-04-08 | Disposition: A | Discharge status: Home / Self Care | Payer: 59 | Source: Ambulatory Visit | Attending: Orthopedic Surgery | Admitting: Orthopedic Surgery

## 2013-04-07 ENCOUNTER — Encounter (HOSPITAL_COMMUNITY): Payer: Self-pay | Admitting: *Deleted

## 2013-04-07 ENCOUNTER — Encounter (HOSPITAL_COMMUNITY)
Admission: RE | Disposition: A | Discharge status: Home / Self Care | Payer: Self-pay | Source: Ambulatory Visit | Attending: Orthopedic Surgery

## 2013-04-07 DIAGNOSIS — R9431 Abnormal electrocardiogram [ECG] [EKG]: Secondary | ICD-10-CM | POA: Insufficient documentation

## 2013-04-07 DIAGNOSIS — E1149 Type 2 diabetes mellitus with other diabetic neurological complication: Secondary | ICD-10-CM | POA: Insufficient documentation

## 2013-04-07 DIAGNOSIS — M86171 Other acute osteomyelitis, right ankle and foot: Secondary | ICD-10-CM

## 2013-04-07 DIAGNOSIS — L97509 Non-pressure chronic ulcer of other part of unspecified foot with unspecified severity: Secondary | ICD-10-CM | POA: Insufficient documentation

## 2013-04-07 DIAGNOSIS — I10 Essential (primary) hypertension: Secondary | ICD-10-CM | POA: Insufficient documentation

## 2013-04-07 DIAGNOSIS — M869 Osteomyelitis, unspecified: Principal | ICD-10-CM | POA: Insufficient documentation

## 2013-04-07 DIAGNOSIS — K219 Gastro-esophageal reflux disease without esophagitis: Secondary | ICD-10-CM | POA: Insufficient documentation

## 2013-04-07 DIAGNOSIS — E11621 Type 2 diabetes mellitus with foot ulcer: Secondary | ICD-10-CM

## 2013-04-07 HISTORY — PX: AMPUTATION: SHX166

## 2013-04-07 HISTORY — DX: Anxiety disorder, unspecified: F41.9

## 2013-04-07 HISTORY — DX: Gastro-esophageal reflux disease without esophagitis: K21.9

## 2013-04-07 HISTORY — DX: Polyneuropathy, unspecified: G62.9

## 2013-04-07 HISTORY — DX: Personal history of other medical treatment: Z92.89

## 2013-04-07 HISTORY — DX: Unspecified injury of head, initial encounter: S09.90XA

## 2013-04-07 LAB — POCT I-STAT 4, (NA,K, GLUC, HGB,HCT)
Glucose, Bld: 84 mg/dL (ref 70–99)
Hemoglobin: 15 g/dL (ref 13.0–17.0)
Hemoglobin: 14.3 g/dL (ref 13.0–17.0)
Potassium: 2.6 mEq/L — CL (ref 3.5–5.1)
Potassium: 2.7 mEq/L — CL (ref 3.5–5.1)
Sodium: 126 mEq/L — ABNORMAL LOW (ref 135–145)
Sodium: 127 mEq/L — ABNORMAL LOW (ref 135–145)

## 2013-04-07 LAB — COMPREHENSIVE METABOLIC PANEL
ALT: 28 U/L (ref 0–53)
Alkaline Phosphatase: 317 U/L — ABNORMAL HIGH (ref 39–117)
CO2: 31 mEq/L (ref 19–32)
GFR calc Af Amer: >90 mL/min (ref >90)
GFR calc non Af Amer: >90 mL/min (ref >90)
Glucose, Bld: 97 mg/dL (ref 70–99)
Potassium: 2.5 mEq/L — CL (ref 3.5–5.1)
Sodium: 121 mEq/L — ABNORMAL LOW (ref 135–145)
Total Protein: 6.4 g/dL (ref 6.0–8.3)

## 2013-04-07 LAB — GLUCOSE, CAPILLARY
Glucose-Capillary: 84 mg/dL (ref 70–99)
Glucose-Capillary: 82 mg/dL (ref 70–99)
Glucose-Capillary: 80 mg/dL (ref 70–99)

## 2013-04-07 LAB — APTT: aPTT: 30 seconds (ref 24–37)

## 2013-04-07 LAB — CBC
Hemoglobin: 15 g/dL (ref 13.0–17.0)
RBC: 4.22 MIL/uL (ref 4.22–5.81)

## 2013-04-07 LAB — SURGICAL PCR SCREEN: Staphylococcus aureus: POSITIVE — AB

## 2013-04-07 SURGERY — AMPUTATION, FOOT, PARTIAL
Anesthesia: General | Site: Foot | Laterality: Right | Wound class: Dirty or Infected

## 2013-04-07 MED ORDER — MUPIROCIN 2 % EX OINT
TOPICAL_OINTMENT | Freq: Two times a day (BID) | CUTANEOUS | Status: DC
  Administered 2013-04-07: 1 via NASAL

## 2013-04-07 MED ORDER — HYDROMORPHONE HCL PF 1 MG/ML IJ SOLN
1.0000 mg | INTRAMUSCULAR | Status: DC | PRN
  Administered 2013-04-07 – 2013-04-08 (×3): 1 mg via INTRAVENOUS
  Filled 2013-04-07 (×3): qty 1

## 2013-04-07 MED ORDER — SODIUM CHLORIDE 0.9 % IV SOLN
INTRAVENOUS | Status: DC
  Administered 2013-04-07 (×2): via INTRAVENOUS

## 2013-04-07 MED ORDER — CEFAZOLIN SODIUM 1-5 GM-% IV SOLN
1.0000 g | Freq: Four times a day (QID) | INTRAVENOUS | Status: DC
  Administered 2013-04-08 (×2): 1 g via INTRAVENOUS
  Filled 2013-04-07 (×3): qty 50

## 2013-04-07 MED ORDER — OXYCODONE-ACETAMINOPHEN 5-325 MG PO TABS: 1.0000 | ORAL_TABLET | ORAL | Status: DC | PRN

## 2013-04-07 MED ORDER — PNEUMOCOCCAL VAC POLYVALENT 25 MCG/0.5ML IJ INJ
0.5000 mL | INJECTION | INTRAMUSCULAR | Status: DC
  Filled 2013-04-07: qty 0.5

## 2013-04-07 MED ORDER — HYDROCODONE-ACETAMINOPHEN 5-325 MG PO TABS: 1.0000 | ORAL_TABLET | ORAL | Status: DC | PRN

## 2013-04-07 MED ORDER — LISINOPRIL 40 MG PO TABS: 40.0000 mg | ORAL_TABLET | Freq: Every day | ORAL | Status: DC

## 2013-04-07 MED ORDER — LACTATED RINGERS IV SOLN
INTRAVENOUS | Status: DC | PRN
  Administered 2013-04-07: 17:00:00 via INTRAVENOUS

## 2013-04-07 MED ORDER — ALPRAZOLAM 0.5 MG PO TABS
0.5000 mg | ORAL_TABLET | Freq: Once | ORAL | Status: AC
  Administered 2013-04-07: 0.5 mg via ORAL
  Filled 2013-04-07: qty 1

## 2013-04-07 MED ORDER — ASPIRIN EC 325 MG PO TBEC
325.0000 mg | DELAYED_RELEASE_TABLET | Freq: Every day | ORAL | Status: DC
  Administered 2013-04-08: 325 mg via ORAL
  Filled 2013-04-07: qty 1

## 2013-04-07 MED ORDER — METOCLOPRAMIDE HCL 10 MG PO TABS: 5.0000 mg | ORAL_TABLET | Freq: Three times a day (TID) | ORAL | Status: DC | PRN

## 2013-04-07 MED ORDER — POTASSIUM CHLORIDE 10 MEQ/100ML IV SOLN
10.0000 meq | INTRAVENOUS | Status: AC
  Administered 2013-04-07 (×2): 10 meq via INTRAVENOUS

## 2013-04-07 MED ORDER — LISINOPRIL 40 MG PO TABS
40.0000 mg | ORAL_TABLET | Freq: Every day | ORAL | Status: DC
  Administered 2013-04-07 – 2013-04-08 (×2): 40 mg via ORAL
  Filled 2013-04-07 (×2): qty 1

## 2013-04-07 MED ORDER — METFORMIN HCL 500 MG PO TABS
1000.0000 mg | ORAL_TABLET | Freq: Two times a day (BID) | ORAL | Status: DC
  Administered 2013-04-07 – 2013-04-08 (×2): 1000 mg via ORAL
  Filled 2013-04-07 (×4): qty 2

## 2013-04-07 MED ORDER — POTASSIUM CHLORIDE 10 MEQ/100ML IV SOLN
10.0000 meq | INTRAVENOUS | Status: AC
  Administered 2013-04-07 (×4): 10 meq via INTRAVENOUS

## 2013-04-07 MED ORDER — LIDOCAINE HCL (CARDIAC) 20 MG/ML IV SOLN
INTRAVENOUS | Status: DC | PRN
  Administered 2013-04-07: 75 mg via INTRAVENOUS

## 2013-04-07 MED ORDER — ONDANSETRON HCL 4 MG PO TABS: 4.0000 mg | ORAL_TABLET | Freq: Four times a day (QID) | ORAL | Status: DC | PRN

## 2013-04-07 MED ORDER — ONDANSETRON HCL 4 MG/2ML IJ SOLN
4.0000 mg | Freq: Four times a day (QID) | INTRAMUSCULAR | Status: DC | PRN
  Administered 2013-04-07 – 2013-04-08 (×2): 4 mg via INTRAVENOUS
  Filled 2013-04-07 (×2): qty 2

## 2013-04-07 MED ORDER — INSULIN ASPART 100 UNIT/ML ~~LOC~~ SOLN: 0.0000 [IU] | Freq: Three times a day (TID) | SUBCUTANEOUS | Status: DC

## 2013-04-07 MED ORDER — PROPOFOL 10 MG/ML IV BOLUS
INTRAVENOUS | Status: DC | PRN
  Administered 2013-04-07: 180 mg via INTRAVENOUS

## 2013-04-07 MED ORDER — METOCLOPRAMIDE HCL 5 MG/ML IJ SOLN
5.0000 mg | Freq: Three times a day (TID) | INTRAMUSCULAR | Status: DC | PRN
  Administered 2013-04-07 – 2013-04-08 (×2): 10 mg via INTRAVENOUS
  Filled 2013-04-07 (×2): qty 2

## 2013-04-07 MED ORDER — FENTANYL CITRATE 0.05 MG/ML IJ SOLN
INTRAMUSCULAR | Status: DC | PRN
  Administered 2013-04-07: 100 ug via INTRAVENOUS

## 2013-04-07 MED ORDER — SODIUM CHLORIDE 0.9 % IV SOLN: INTRAVENOUS | Status: DC

## 2013-04-07 MED ORDER — PHENYLEPHRINE HCL 10 MG/ML IJ SOLN
INTRAMUSCULAR | Status: DC | PRN
  Administered 2013-04-07: 80 ug via INTRAVENOUS

## 2013-04-07 MED ORDER — METFORMIN HCL 500 MG PO TABS
1000.0000 mg | ORAL_TABLET | Freq: Two times a day (BID) | ORAL | Status: DC
  Filled 2013-04-07: qty 2

## 2013-04-07 MED ORDER — MUPIROCIN 2 % EX OINT
TOPICAL_OINTMENT | CUTANEOUS | Status: AC
  Filled 2013-04-07: qty 22

## 2013-04-07 MED ORDER — MIDAZOLAM HCL 5 MG/5ML IJ SOLN
INTRAMUSCULAR | Status: DC | PRN
  Administered 2013-04-07: 2 mg via INTRAVENOUS

## 2013-04-07 SURGICAL SUPPLY — 41 items
BANDAGE GAUZE ELAST BULKY 4 IN (GAUZE/BANDAGES/DRESSINGS) ×3 IMPLANT
BLADE SAW SGTL HD 18.5X60.5X1. (BLADE) ×2 IMPLANT
BLADE SURG 10 STRL SS (BLADE) IMPLANT
BNDG COHESIVE 4X5 TAN STRL (GAUZE/BANDAGES/DRESSINGS) ×3 IMPLANT
CLOTH BEACON ORANGE TIMEOUT ST (SAFETY) ×2 IMPLANT
COVER SURGICAL LIGHT HANDLE (MISCELLANEOUS) ×2 IMPLANT
CUFF TOURNIQUET SINGLE 34IN LL (TOURNIQUET CUFF) IMPLANT
CUFF TOURNIQUET SINGLE 44IN (TOURNIQUET CUFF) IMPLANT
DRAPE U-SHAPE 47X51 STRL (DRAPES) ×2 IMPLANT
DRSG ADAPTIC 3X8 NADH LF (GAUZE/BANDAGES/DRESSINGS) ×2 IMPLANT
DURAPREP 26ML APPLICATOR (WOUND CARE) ×2 IMPLANT
ELECT REM PT RETURN 9FT ADLT (ELECTROSURGICAL) ×2
ELECTRODE REM PT RTRN 9FT ADLT (ELECTROSURGICAL) ×1 IMPLANT
GLOVE BIOGEL PI IND STRL 7.0 (GLOVE) IMPLANT
GLOVE BIOGEL PI IND STRL 9 (GLOVE) ×1 IMPLANT
GLOVE BIOGEL PI INDICATOR 7.0 (GLOVE) ×1
GLOVE BIOGEL PI INDICATOR 9 (GLOVE) ×1
GLOVE ECLIPSE 6.5 STRL STRAW (GLOVE) ×1 IMPLANT
GLOVE ECLIPSE 7.0 STRL STRAW (GLOVE) ×1 IMPLANT
GLOVE SURG ORTHO 9.0 STRL STRW (GLOVE) ×2 IMPLANT
GOWN PREVENTION PLUS XLARGE (GOWN DISPOSABLE) ×3 IMPLANT
GOWN SRG XL XLNG 56XLVL 4 (GOWN DISPOSABLE) ×2 IMPLANT
GOWN STRL NON-REIN XL XLG LVL4 (GOWN DISPOSABLE) ×2
KIT BASIN OR (CUSTOM PROCEDURE TRAY) ×2 IMPLANT
KIT ROOM TURNOVER OR (KITS) ×2 IMPLANT
MANIFOLD NEPTUNE II (INSTRUMENTS) ×1 IMPLANT
NS IRRIG 1000ML POUR BTL (IV SOLUTION) ×2 IMPLANT
PACK ORTHO EXTREMITY (CUSTOM PROCEDURE TRAY) ×2 IMPLANT
PAD ARMBOARD 7.5X6 YLW CONV (MISCELLANEOUS) ×4 IMPLANT
PAD CAST 4YDX4 CTTN HI CHSV (CAST SUPPLIES) ×1 IMPLANT
PADDING CAST COTTON 4X4 STRL (CAST SUPPLIES)
SPONGE GAUZE 4X4 12PLY (GAUZE/BANDAGES/DRESSINGS) ×2 IMPLANT
SPONGE LAP 18X18 X RAY DECT (DISPOSABLE) ×2 IMPLANT
STAPLER VISISTAT 35W (STAPLE) ×2 IMPLANT
SUT ETHILON 2 0 PSLX (SUTURE) ×4 IMPLANT
SUT VIC AB 2-0 CTB1 (SUTURE) ×3 IMPLANT
TOWEL OR 17X24 6PK STRL BLUE (TOWEL DISPOSABLE) ×2 IMPLANT
TOWEL OR 17X26 10 PK STRL BLUE (TOWEL DISPOSABLE) ×2 IMPLANT
TUBE CONNECTING 12X1/4 (SUCTIONS) ×2 IMPLANT
WATER STERILE IRR 1000ML POUR (IV SOLUTION) ×1 IMPLANT
YANKAUER SUCT BULB TIP NO VENT (SUCTIONS) ×2 IMPLANT

## 2013-04-07 NOTE — Progress Notes (Signed)
Dr. Lajoyce Corners notified of potassium results instructed to give additional 2 runs of potassium and then recheck

## 2013-04-07 NOTE — Anesthesia Postprocedure Evaluation (Signed)
  Anesthesia Post-op Note  Patient: Wesley Burns  Procedure(s) Performed: Procedure(s) with comments: AMPUTATION MID-FOOT RIGHT (Right) - Right Midfoot Amputation  Patient Location: PACU  Anesthesia Type:General  Level of Consciousness: awake, alert , oriented and patient cooperative  Airway and Oxygen Therapy: Patient Spontanous Breathing  Post-op Pain: none  Post-op Assessment: Post-op Vital signs reviewed, Patient's Cardiovascular Status Stable, Respiratory Function Stable, Patent Airway, No signs of Nausea or vomiting and Pain level controlled  Post-op Vital Signs: Reviewed and stable  Complications: No apparent anesthesia complications

## 2013-04-07 NOTE — Progress Notes (Signed)
K given as ordered per Dr Lajoyce Corners. K 2.7  Report called to dr Lajoyce Corners.
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
## 2013-04-07 NOTE — Anesthesia Procedure Notes (Signed)
Procedure Name: LMA Insertion Date/Time: 04/07/2013 4:55 PM Performed by: Gwenyth Allegra Pre-anesthesia Checklist: Patient identified, Timeout performed, Emergency Drugs available, Suction available and Patient being monitored Patient Re-evaluated:Patient Re-evaluated prior to inductionOxygen Delivery Method: Circle system utilized Preoxygenation: Pre-oxygenation with 100% oxygen Intubation Type: IV induction LMA: LMA inserted LMA Size: 4.0 Number of attempts: 1 Placement Confirmation: positive ETCO2 and breath sounds checked- equal and bilateral Tube secured with: Tape Dental Injury: Teeth and Oropharynx as per pre-operative assessment

## 2013-04-07 NOTE — Anesthesia Preprocedure Evaluation (Addendum)
Anesthesia Evaluation  Patient identified by MRN, date of birth, ID band Patient awake    Airway Mallampati: I TM Distance: >3 FB Neck ROM: full    Dental   Pulmonary neg pulmonary ROS,          Cardiovascular hypertension, + Peripheral Vascular Disease Rhythm:regular Rate:Normal     Neuro/Psych Anxiety negative neurological ROS     GI/Hepatic Neg liver ROS, GERD-  ,  Endo/Other  diabetes, Type 2  Renal/GU negative Renal ROS     Musculoskeletal   Abdominal   Peds  Hematology negative hematology ROS (+)   Anesthesia Other Findings   Reproductive/Obstetrics                          Anesthesia Physical Anesthesia Plan  ASA: III  Anesthesia Plan: General   Post-op Pain Management:    Induction: Intravenous  Airway Management Planned: LMA and Oral ETT  Additional Equipment:   Intra-op Plan:   Post-operative Plan: Extubation in OR  Informed Consent: I have reviewed the patients History and Physical, chart, labs and discussed the procedure including the risks, benefits and alternatives for the proposed anesthesia with the patient or authorized representative who has indicated his/her understanding and acceptance.     Plan Discussed with: CRNA, Anesthesiologist and Surgeon  Anesthesia Plan Comments:         Anesthesia Quick Evaluation

## 2013-04-07 NOTE — Preoperative (Signed)
Beta Blockers   Reason not to administer Beta Blockers:Not Applicable 

## 2013-04-07 NOTE — Progress Notes (Signed)
Last run of K hung.  Patient resting quietly without complaints.

## 2013-04-07 NOTE — Progress Notes (Signed)
Family at bedside. Potassium infusing will continue to monitor

## 2013-04-07 NOTE — Transfer of Care (Signed)
Immediate Anesthesia Transfer of Care Note  Patient: Wesley Burns  Procedure(s) Performed: Procedure(s) with comments: AMPUTATION MID-FOOT RIGHT (Right) - Right Midfoot Amputation  Patient Location: PACU  Anesthesia Type:General  Level of Consciousness: awake and alert   Airway & Oxygen Therapy: Patient Spontanous Breathing and Patient connected to nasal cannula oxygen  Post-op Assessment: Report given to PACU RN and Post -op Vital signs reviewed and stable  Post vital signs: Reviewed and stable  Complications: No apparent anesthesia complications

## 2013-04-07 NOTE — Op Note (Signed)
OPERATIVE REPORT  DATE OF SURGERY: 04/07/2013  PATIENT:  Wesley Burns,  47 y.o. male  PRE-OPERATIVE DIAGNOSIS:  Osteomyelitis right foot  POST-OPERATIVE DIAGNOSIS:  Osteomyelitis right foot  PROCEDURE:  Procedure(s): AMPUTATION MID-FOOT RIGHT  SURGEON:  Surgeon(s): Nadara Mustard, MD  ANESTHESIA:   general  EBL:  min ML  SPECIMEN:  Right Foot  TOURNIQUET:  * No tourniquets in log *  PROCEDURE DETAILS: Patient is a 47 year old gentleman diabetes chronic ulceration dermatitis and osteomyelitis of the left forefoot. Patient has failed conservative care has significant forefoot deformity and presents at this time for midfoot amputation. Risks and benefits were discussed including infection neurovascular injury pain DVT need for additional surgery. Patient states he understands and wished to proceed at this time. Description of procedure patient brought to the operating room and underwent a general anesthetic. After adequate levels of anesthesia were obtained patient's right lower extremity was prepped using DuraPrep draped into a sterile field. A fishmouth incision was made through the midfoot. Electrocautery was used for hemostasis. Oscillating saw was used to perform a transmetatarsal amputation. The wounds irrigated normal saline. Hemostasis was obtained. The skin was closed using 2-0 nylon. The wound is covered with Adaptic orthopedic sponges ABDs Kerlix and Coban. Patient was extubated taken to the PACU in stable condition.  PLAN OF CARE: Admit for overnight observation  PATIENT DISPOSITION:  PACU - hemodynamically stable.   Nadara Mustard, MD 04/07/2013 5:28 PM

## 2013-04-08 ENCOUNTER — Encounter (HOSPITAL_COMMUNITY): Payer: Self-pay | Admitting: Orthopedic Surgery

## 2013-04-08 LAB — GLUCOSE, CAPILLARY: Glucose-Capillary: 118 mg/dL — ABNORMAL HIGH (ref 70–99)

## 2013-04-08 MED ORDER — HYDROCODONE-ACETAMINOPHEN 5-325 MG PO TABS: 1.0000 | ORAL_TABLET | Freq: Four times a day (QID) | ORAL | Status: DC | PRN

## 2013-04-08 NOTE — Progress Notes (Signed)
Orthopedic Tech Progress Note Patient Details:  Wesley Burns [birth year] [id number]  Ortho Devices Type of Ortho Device: CAM walker   Haskell Flirt 04/08/2013, 2:05 AM

## 2013-04-08 NOTE — Care Management Note (Signed)
CARE MANAGEMENT NOTE 04/08/2013  Patient:  RACER, QUAM   Account Number:  [id number]  Date Initiated:  04/08/2013  Documentation initiated by:  Vance Peper  Subjective/Objective Assessment:   47 yr old male adm for osteo of left foot.  S/p left midfoot amputation.     Action/Plan:   Cm spoke with patient concerning home health and DME needs at discharge.  Choice offered. patient has DME.  Referral called to [facility], [facility] liasion.   Anticipated DC Date:  04/08/2013   Anticipated DC Plan:  HOME W HOME HEALTH SERVICES      DC Planning Services  CM consult      [facility] Choice  HOME HEALTH   Choice offered to / List presented to:  C-1 Patient        HH arranged  HH-2 PT      [facility] agency  [facility]   Status of service:  Completed, signed off Medicare Important Message given?   (If response is "NO", the following Medicare IM given date fields will be blank) Date Medicare IM given:   Date Additional Medicare IM given:    Discharge Disposition:  HOME W HOME HEALTH SERVICES  Per UR Regulation:    If discussed at Long Length of Stay Meetings, dates discussed:    Comments:

## 2013-04-08 NOTE — Discharge Summary (Signed)
  Discharge to home in stable condition status post midfoot amputation.

## 2013-04-08 NOTE — Progress Notes (Signed)
PT EVALUATION and DC NOTE  04/08/13 0758  PT Visit Information  Last PT Received On 04/08/13  Assistance Needed +1  PT Time Calculation  PT Start Time 0845 (first time in 0754-0800; pt. needed to sint on 3n1)  PT Stop Time 0910 ( 0800; session in 2 parts due to to needed to sit on 3n1)  PT Time Calculation (min) 25 min  Subjective Data  Subjective Didn't sleep last night  Patient Stated Goal Walk without hurting  Precautions  Precautions Fall  Precaution Comments currently NWB  Required Braces or Orthoses Other Brace/Splint  Other Brace/Splint ; pt. already had closed toe cam /diabetic boot that he was requesting to use.  Phoned Dr. Lajoyce Corners and received OK to use pt.'s diabetic boot with closed toe  Restrictions  Weight Bearing Restrictions Yes  RLE Weight Bearing NWB  Home Living  Lives With Friend(s);Family (nephews , age 71 and 34 and friend Phil)  Available Help at Discharge Family;Friend(s)  Type of Home House  Home Access Stairs to enter  Entrance Stairs-Number of Steps 2  Entrance Stairs-Rails None  Home Layout Multi-level;Able to live on main level with bedroom/bathroom;Full bath on main level  Bathroom Shower/Tub Walk-in shower;Door  Horticulturist, commercial Yes  How Accessible Accessible via walker  Home Adaptive Equipment Walker - standard;Grab bars in shower;Shower chair with back;Other (comment) (transport chair)  Prior Function  Level of Independence Independent  Able to Take Stairs? Yes  Driving Yes  Vocation Other (comment) (works from home)  Geneticist, molecular No difficulties  Cognition  Arousal/Alertness Awake/alert  Behavior During Therapy WFL for tasks assessed/performed  Overall Cognitive Status Within Functional Limits for tasks assessed  Right Upper Extremity Assessment  RUE ROM/Strength/Tone WFL for tasks assessed  RUE Sensation WFL - Light Touch  Left Upper Extremity Assessment  LUE ROM/Strength/Tone WFL for  tasks assessed  LUE Sensation WFL - Light Touch  Right Lower Extremity Assessment  RLE ROM/Strength/Tone WFL for tasks assessed  Left Lower Extremity Assessment  LLE ROM/Strength/Tone WFL for tasks assessed  Trunk Assessment  Trunk Assessment Normal  Bed Mobility  Bed Mobility Supine to Sit;Sitting - Scoot to Edge of Bed  Supine to Sit 5: Supervision  Sitting - Scoot to Edge of Bed 5: Supervision  Details for Bed Mobility Assistance Pt. able to manage transition well  Transfers  Transfers Sit to Stand;Stand to Sit  Sit to Stand 4: Min assist;From bed  Stand to Sit 4: Min guard;To chair/3-in-1  Details for Transfer Assistance cues for hand placment and min assist to rise to stand for balance purposes  Ambulation/Gait  Ambulation/Gait Assistance 4: Min assist  Ambulation Distance (Feet) 20 Feet  Assistive device Rolling walker  Ambulation/Gait Assistance Details Pt. able to maintain NWB R LE in cam/diabetic boot.  Fatigues quickly due to NWB status.    Gait Pattern (single leg hop)  Gait velocity decreased  General Gait Details Pt. reports L charcot foot with ulcer on plantar surface near base of great toe.  Used his post op shoe on left foot for protection.  Stairs No  PT - End of Session  Equipment Utilized During Treatment Gait belt;Other (comment) (left post op shoe, R cam/diabetic boot)  Activity Tolerance Patient limited by fatigue  Patient left in chair;with call bell/phone within reach;with family/visitor present  Nurse Communication Mobility status;Weight bearing status  PT Assessment  Clinical Impression Statement Pt. was admitted with osteomyelitis of right foot, s/p midfoot amputation  and diabetic  insensate neuropathy.  He presents to Pt with decreased mobility and gait due to post op and NWB status.  Pt will benefit from HHPT as he has DC home orders and wants to leave as quickly as possible.  IPt. and his friend Michele Mcalpine express that they do not want to wait to have  equipment delivered to the room and Philo plans to go out and get pt. a WC, RW and 3n1.  Educated apt. and Phil on ascending 2 steps with 2 assist in the transport WC they already have.  They can verbalize the technique.  Pt.has DC orders, so no goals set as DC is pending.  Will sign off.  PT Recommendation/Assessment All further PT needs can be met in the next venue of care  PT Problem List Decreased activity tolerance;Decreased mobility;Decreased knowledge of use of DME;Decreased knowledge of precautions;Impaired sensation  PT Therapy Diagnosis  Difficulty walking  PT Recommendation  Follow Up Recommendations Home health PT;Supervision/Assistance - 24 hour;Supervision for mobility/OOB  PT equipment Rolling walker with 5" wheels;Wheelchair (measurements PT);Wheelchair cushion (measurements PT);Other (comment) (3n1)  Individuals Consulted  Consulted and Agree with Results and Recommendations Patient  PT G-Codes **NOT FOR INPATIENT CLASS**  Functional Assessment Tool Used clinical judgement  Functional Limitation Mobility: Walking and moving around  Mobility: Walking and Moving Around Current Status (R6045) CK  Mobility: Walking and Moving Around Goal Status (W0981) CK  Mobility: Walking and Moving Around Discharge Status (X9147) CK  PT General Charges  $$ ACUTE PT VISIT 1 Procedure  PT Evaluation  $Initial PT Evaluation Tier I 1 Procedure  PT Treatments  $Gait Training 8-22 mins  Weldon Picking PT Acute Rehab Services [phone] Beeper [phone]

## 2013-04-08 NOTE — Progress Notes (Signed)
Utilization review completed. Leticia Mcdiarmid, RN, BSN. 

## 2013-05-06 ENCOUNTER — Other Ambulatory Visit (HOSPITAL_COMMUNITY): Payer: Self-pay | Admitting: Orthopedic Surgery

## 2013-05-06 ENCOUNTER — Encounter (HOSPITAL_COMMUNITY): Payer: Self-pay | Admitting: *Deleted

## 2013-05-06 ENCOUNTER — Encounter (HOSPITAL_COMMUNITY): Payer: Self-pay | Admitting: Pharmacy Technician

## 2013-05-06 MED ORDER — CEFAZOLIN SODIUM-DEXTROSE 2-3 GM-% IV SOLR
2.0000 g | INTRAVENOUS | Status: AC
  Administered 2013-05-07: 2 g via INTRAVENOUS

## 2013-05-07 ENCOUNTER — Encounter (HOSPITAL_COMMUNITY): Payer: Self-pay | Admitting: Surgery

## 2013-05-07 ENCOUNTER — Ambulatory Visit (HOSPITAL_COMMUNITY): Payer: 59 | Admitting: Certified Registered"

## 2013-05-07 ENCOUNTER — Ambulatory Visit (HOSPITAL_COMMUNITY)
Admission: RE | Admit: 2013-05-07 | Discharge: 2013-05-08 | Disposition: A | Discharge status: Home / Self Care | Payer: 59 | Source: Ambulatory Visit | Attending: Orthopedic Surgery | Admitting: Orthopedic Surgery

## 2013-05-07 ENCOUNTER — Encounter (HOSPITAL_COMMUNITY): Payer: Self-pay | Admitting: Certified Registered"

## 2013-05-07 ENCOUNTER — Encounter (HOSPITAL_COMMUNITY)
Admission: RE | Disposition: A | Discharge status: Home / Self Care | Payer: Self-pay | Source: Ambulatory Visit | Attending: Orthopedic Surgery

## 2013-05-07 DIAGNOSIS — E1159 Type 2 diabetes mellitus with other circulatory complications: Secondary | ICD-10-CM | POA: Insufficient documentation

## 2013-05-07 DIAGNOSIS — M9080 Osteopathy in diseases classified elsewhere, unspecified site: Secondary | ICD-10-CM | POA: Insufficient documentation

## 2013-05-07 DIAGNOSIS — M869 Osteomyelitis, unspecified: Secondary | ICD-10-CM | POA: Insufficient documentation

## 2013-05-07 DIAGNOSIS — M86171 Other acute osteomyelitis, right ankle and foot: Secondary | ICD-10-CM

## 2013-05-07 DIAGNOSIS — E1169 Type 2 diabetes mellitus with other specified complication: Secondary | ICD-10-CM | POA: Insufficient documentation

## 2013-05-07 DIAGNOSIS — I10 Essential (primary) hypertension: Secondary | ICD-10-CM | POA: Insufficient documentation

## 2013-05-07 DIAGNOSIS — I798 Other disorders of arteries, arterioles and capillaries in diseases classified elsewhere: Secondary | ICD-10-CM | POA: Insufficient documentation

## 2013-05-07 DIAGNOSIS — L97509 Non-pressure chronic ulcer of other part of unspecified foot with unspecified severity: Secondary | ICD-10-CM | POA: Insufficient documentation

## 2013-05-07 DIAGNOSIS — Y835 Amputation of limb(s) as the cause of abnormal reaction of the patient, or of later complication, without mention of misadventure at the time of the procedure: Secondary | ICD-10-CM | POA: Insufficient documentation

## 2013-05-07 DIAGNOSIS — T8789 Other complications of amputation stump: Secondary | ICD-10-CM | POA: Insufficient documentation

## 2013-05-07 HISTORY — DX: Osteomyelitis, unspecified: M86.9

## 2013-05-07 HISTORY — PX: AMPUTATION: SHX166

## 2013-05-07 LAB — COMPREHENSIVE METABOLIC PANEL WITH GFR
ALT: 17 U/L (ref 0–53)
AST: 48 U/L — ABNORMAL HIGH (ref 0–37)
Albumin: 2.2 g/dL — ABNORMAL LOW (ref 3.5–5.2)
Alkaline Phosphatase: 220 U/L — ABNORMAL HIGH (ref 39–117)
BUN: 4 mg/dL — ABNORMAL LOW (ref 6–23)
CO2: 26 meq/L (ref 19–32)
Calcium: 9 mg/dL (ref 8.4–10.5)
Chloride: 97 meq/L (ref 96–112)
Creatinine, Ser: 0.71 mg/dL (ref 0.50–1.35)
GFR calc Af Amer: >90 mL/min
GFR calc non Af Amer: >90 mL/min
Glucose, Bld: 99 mg/dL (ref 70–99)
Potassium: 3.6 meq/L (ref 3.5–5.1)
Sodium: 134 meq/L — ABNORMAL LOW (ref 135–145)
Total Bilirubin: 0.3 mg/dL (ref 0.3–1.2)
Total Protein: 5.9 g/dL — ABNORMAL LOW (ref 6.0–8.3)

## 2013-05-07 LAB — CBC
HCT: 32.2 % — ABNORMAL LOW (ref 39.0–52.0)
Hemoglobin: 11.5 g/dL — ABNORMAL LOW (ref 13.0–17.0)
MCV: 100.3 fL — ABNORMAL HIGH (ref 78.0–100.0)
RBC: 3.21 MIL/uL — ABNORMAL LOW (ref 4.22–5.81)
WBC: 3.9 10*3/uL — ABNORMAL LOW (ref 4.0–10.5)

## 2013-05-07 LAB — PROTIME-INR
INR: 0.87 (ref 0.00–1.49)
Prothrombin Time: 11.8 s (ref 11.6–15.2)

## 2013-05-07 LAB — APTT: aPTT: 27 s (ref 24–37)

## 2013-05-07 SURGERY — AMPUTATION, FOOT, PARTIAL
Anesthesia: General | Site: Foot | Laterality: Right | Wound class: Dirty or Infected

## 2013-05-07 MED ORDER — METFORMIN HCL 500 MG PO TABS
1000.0000 mg | ORAL_TABLET | Freq: Two times a day (BID) | ORAL | Status: DC
  Administered 2013-05-08: 1000 mg via ORAL
  Filled 2013-05-07 (×3): qty 2

## 2013-05-07 MED ORDER — METOCLOPRAMIDE HCL 10 MG PO TABS: 5.0000 mg | ORAL_TABLET | Freq: Three times a day (TID) | ORAL | Status: DC | PRN

## 2013-05-07 MED ORDER — ONDANSETRON HCL 4 MG PO TABS
4.0000 mg | ORAL_TABLET | Freq: Four times a day (QID) | ORAL | Status: DC | PRN
  Administered 2013-05-08: 4 mg via ORAL
  Filled 2013-05-07: qty 1

## 2013-05-07 MED ORDER — HYDROCODONE-ACETAMINOPHEN 5-325 MG PO TABS: 1.0000 | ORAL_TABLET | Freq: Four times a day (QID) | ORAL | Status: DC | PRN

## 2013-05-07 MED ORDER — GLYCOPYRROLATE 0.2 MG/ML IJ SOLN
INTRAMUSCULAR | Status: DC | PRN
  Administered 2013-05-07: 0.2 mg via INTRAVENOUS

## 2013-05-07 MED ORDER — PROPOFOL 10 MG/ML IV BOLUS
INTRAVENOUS | Status: DC | PRN
  Administered 2013-05-07: 150 mg via INTRAVENOUS

## 2013-05-07 MED ORDER — LACTATED RINGERS IV SOLN
INTRAVENOUS | Status: DC
Start: 2013-05-07 — End: 2013-05-07
  Administered 2013-05-07: 15:00:00 via INTRAVENOUS

## 2013-05-07 MED ORDER — HYDROCODONE-ACETAMINOPHEN 5-325 MG PO TABS
1.0000 | ORAL_TABLET | ORAL | Status: DC | PRN
  Administered 2013-05-07 – 2013-05-08 (×3): 2 via ORAL
  Filled 2013-05-07 (×3): qty 2

## 2013-05-07 MED ORDER — MUPIROCIN 2 % EX OINT
TOPICAL_OINTMENT | Freq: Two times a day (BID) | CUTANEOUS | Status: DC
  Administered 2013-05-07: 1 via NASAL
  Filled 2013-05-07: qty 22

## 2013-05-07 MED ORDER — SODIUM CHLORIDE 0.9 % IV SOLN
INTRAVENOUS | Status: DC
  Administered 2013-05-07: via INTRAVENOUS

## 2013-05-07 MED ORDER — HYDROMORPHONE HCL PF 1 MG/ML IJ SOLN
INTRAMUSCULAR | Status: AC
  Filled 2013-05-07: qty 1

## 2013-05-07 MED ORDER — FENTANYL CITRATE 0.05 MG/ML IJ SOLN
INTRAMUSCULAR | Status: DC | PRN
  Administered 2013-05-07: 100 ug via INTRAVENOUS

## 2013-05-07 MED ORDER — ONDANSETRON HCL 4 MG/2ML IJ SOLN
INTRAMUSCULAR | Status: DC | PRN
  Administered 2013-05-07: 4 mg via INTRAVENOUS

## 2013-05-07 MED ORDER — PHENYLEPHRINE HCL 10 MG/ML IJ SOLN
INTRAMUSCULAR | Status: DC | PRN
  Administered 2013-05-07 (×3): 40 ug via INTRAVENOUS

## 2013-05-07 MED ORDER — ONDANSETRON HCL 4 MG/2ML IJ SOLN
INTRAMUSCULAR | Status: AC
  Filled 2013-05-07: qty 2

## 2013-05-07 MED ORDER — HYDROMORPHONE HCL PF 1 MG/ML IJ SOLN
0.5000 mg | INTRAMUSCULAR | Status: DC | PRN
  Administered 2013-05-07 – 2013-05-08 (×5): 1 mg via INTRAVENOUS
  Filled 2013-05-07 (×5): qty 1

## 2013-05-07 MED ORDER — METOCLOPRAMIDE HCL 5 MG/ML IJ SOLN
10.0000 mg | Freq: Four times a day (QID) | INTRAMUSCULAR | Status: DC | PRN
  Administered 2013-05-07: 10 mg via INTRAVENOUS

## 2013-05-07 MED ORDER — CEFAZOLIN SODIUM 1-5 GM-% IV SOLN
1.0000 g | Freq: Four times a day (QID) | INTRAVENOUS | Status: AC
  Administered 2013-05-07 – 2013-05-08 (×3): 1 g via INTRAVENOUS
  Filled 2013-05-07 (×3): qty 50

## 2013-05-07 MED ORDER — MIDAZOLAM HCL 5 MG/5ML IJ SOLN
INTRAMUSCULAR | Status: DC | PRN
  Administered 2013-05-07: 2 mg via INTRAVENOUS

## 2013-05-07 MED ORDER — OXYCODONE-ACETAMINOPHEN 5-325 MG PO TABS
1.0000 | ORAL_TABLET | ORAL | Status: DC | PRN
  Administered 2013-05-08: 2 via ORAL
  Filled 2013-05-07: qty 2

## 2013-05-07 MED ORDER — HYDROMORPHONE HCL PF 1 MG/ML IJ SOLN
0.2500 mg | INTRAMUSCULAR | Status: DC | PRN
  Administered 2013-05-07 (×4): 0.5 mg via INTRAVENOUS

## 2013-05-07 MED ORDER — ONDANSETRON HCL 4 MG/2ML IJ SOLN: 4.0000 mg | Freq: Four times a day (QID) | INTRAMUSCULAR | Status: DC | PRN

## 2013-05-07 MED ORDER — LACTATED RINGERS IV SOLN
INTRAVENOUS | Status: DC | PRN
  Administered 2013-05-07 (×2): via INTRAVENOUS

## 2013-05-07 MED ORDER — LIDOCAINE HCL (CARDIAC) 20 MG/ML IV SOLN
INTRAVENOUS | Status: DC | PRN
  Administered 2013-05-07: 60 mg via INTRAVENOUS

## 2013-05-07 MED ORDER — ALBUMIN HUMAN 5 % IV SOLN
INTRAVENOUS | Status: DC | PRN
  Administered 2013-05-07: 16:00:00 via INTRAVENOUS

## 2013-05-07 MED ORDER — METOCLOPRAMIDE HCL 5 MG/ML IJ SOLN
INTRAMUSCULAR | Status: AC
  Filled 2013-05-07: qty 2

## 2013-05-07 MED ORDER — CEFAZOLIN SODIUM-DEXTROSE 2-3 GM-% IV SOLR
INTRAVENOUS | Status: AC
  Filled 2013-05-07: qty 50

## 2013-05-07 MED ORDER — 0.9 % SODIUM CHLORIDE (POUR BTL) OPTIME
TOPICAL | Status: DC | PRN
  Administered 2013-05-07: 1000 mL

## 2013-05-07 MED ORDER — ONDANSETRON HCL 4 MG/2ML IJ SOLN
4.0000 mg | Freq: Once | INTRAMUSCULAR | Status: AC | PRN
  Administered 2013-05-07: 4 mg via INTRAVENOUS

## 2013-05-07 MED ORDER — METOCLOPRAMIDE HCL 5 MG/ML IJ SOLN
5.0000 mg | Freq: Three times a day (TID) | INTRAMUSCULAR | Status: DC | PRN
  Administered 2013-05-08: 10 mg via INTRAVENOUS
  Filled 2013-05-07: qty 2

## 2013-05-07 MED ORDER — LISINOPRIL 40 MG PO TABS
40.0000 mg | ORAL_TABLET | Freq: Every day | ORAL | Status: DC
  Administered 2013-05-07 – 2013-05-08 (×2): 40 mg via ORAL
  Filled 2013-05-07 (×2): qty 1

## 2013-05-07 SURGICAL SUPPLY — 38 items
BANDAGE GAUZE ELAST BULKY 4 IN (GAUZE/BANDAGES/DRESSINGS) ×3 IMPLANT
BLADE SAW SGTL HD 18.5X60.5X1. (BLADE) ×2 IMPLANT
BLADE SURG 10 STRL SS (BLADE) IMPLANT
BNDG COHESIVE 4X5 TAN STRL (GAUZE/BANDAGES/DRESSINGS) ×3 IMPLANT
CLOTH BEACON ORANGE TIMEOUT ST (SAFETY) ×2 IMPLANT
COVER SURGICAL LIGHT HANDLE (MISCELLANEOUS) ×2 IMPLANT
CUFF TOURNIQUET SINGLE 34IN LL (TOURNIQUET CUFF) IMPLANT
CUFF TOURNIQUET SINGLE 44IN (TOURNIQUET CUFF) IMPLANT
DRAPE U-SHAPE 47X51 STRL (DRAPES) ×2 IMPLANT
DRSG ADAPTIC 3X8 NADH LF (GAUZE/BANDAGES/DRESSINGS) ×2 IMPLANT
DRSG PAD ABDOMINAL 8X10 ST (GAUZE/BANDAGES/DRESSINGS) ×1 IMPLANT
DURAPREP 26ML APPLICATOR (WOUND CARE) ×2 IMPLANT
ELECT REM PT RETURN 9FT ADLT (ELECTROSURGICAL) ×2
ELECTRODE REM PT RTRN 9FT ADLT (ELECTROSURGICAL) ×1 IMPLANT
GLOVE BIOGEL PI IND STRL 9 (GLOVE) ×1 IMPLANT
GLOVE BIOGEL PI INDICATOR 9 (GLOVE) ×1
GLOVE SURG ORTHO 9.0 STRL STRW (GLOVE) ×2 IMPLANT
GOWN PREVENTION PLUS XLARGE (GOWN DISPOSABLE) ×2 IMPLANT
GOWN SRG XL XLNG 56XLVL 4 (GOWN DISPOSABLE) ×2 IMPLANT
GOWN STRL NON-REIN XL XLG LVL4 (GOWN DISPOSABLE) ×4
KIT BASIN OR (CUSTOM PROCEDURE TRAY) ×2 IMPLANT
KIT ROOM TURNOVER OR (KITS) ×2 IMPLANT
MANIFOLD NEPTUNE II (INSTRUMENTS) ×2 IMPLANT
NS IRRIG 1000ML POUR BTL (IV SOLUTION) ×2 IMPLANT
PACK ORTHO EXTREMITY (CUSTOM PROCEDURE TRAY) ×2 IMPLANT
PAD ARMBOARD 7.5X6 YLW CONV (MISCELLANEOUS) ×4 IMPLANT
PAD CAST 4YDX4 CTTN HI CHSV (CAST SUPPLIES) ×1 IMPLANT
PADDING CAST COTTON 4X4 STRL (CAST SUPPLIES) ×2
SPONGE GAUZE 4X4 12PLY (GAUZE/BANDAGES/DRESSINGS) ×2 IMPLANT
SPONGE LAP 18X18 X RAY DECT (DISPOSABLE) ×2 IMPLANT
STAPLER VISISTAT 35W (STAPLE) ×2 IMPLANT
SUT ETHILON 2 0 PSLX (SUTURE) ×6 IMPLANT
SUT VIC AB 2-0 CTB1 (SUTURE) ×4 IMPLANT
TOWEL OR 17X24 6PK STRL BLUE (TOWEL DISPOSABLE) ×2 IMPLANT
TOWEL OR 17X26 10 PK STRL BLUE (TOWEL DISPOSABLE) ×2 IMPLANT
TUBE CONNECTING 12X1/4 (SUCTIONS) ×2 IMPLANT
WATER STERILE IRR 1000ML POUR (IV SOLUTION) ×2 IMPLANT
YANKAUER SUCT BULB TIP NO VENT (SUCTIONS) ×2 IMPLANT

## 2013-05-07 NOTE — Preoperative (Signed)
Beta Blockers   Reason not to administer Beta Blockers:Not Applicable 

## 2013-05-07 NOTE — Anesthesia Postprocedure Evaluation (Signed)
  Anesthesia Post-op Note  Patient: Wesley Burns  Procedure(s) Performed: Procedure(s) with comments: Revision Right Foot Midfoot Amputation (Right) - Revision Right Foot Midfoot Amputation  Patient Location: PACU  Anesthesia Type:General  Level of Consciousness: awake, oriented and patient cooperative  Airway and Oxygen Therapy: Patient Spontanous Breathing  Post-op Pain: none  Post-op Assessment: Post-op Vital signs reviewed, Patient's Cardiovascular Status Stable, Respiratory Function Stable, Patent Airway, No signs of Nausea or vomiting and Pain level controlled  Post-op Vital Signs: stable  Complications: No apparent anesthesia complications

## 2013-05-07 NOTE — Transfer of Care (Signed)
Immediate Anesthesia Transfer of Care Note  Patient: Wesley Burns  Procedure(s) Performed: Procedure(s) with comments: Revision Right Foot Midfoot Amputation (Right) - Revision Right Foot Midfoot Amputation  Patient Location: PACU  Anesthesia Type:General  Level of Consciousness: awake, alert  and oriented  Airway & Oxygen Therapy: Patient connected to face mask oxygen  Post-op Assessment: Report given to PACU RN  Post vital signs: stable  Complications: No apparent anesthesia complications

## 2013-05-07 NOTE — Op Note (Signed)
OPERATIVE REPORT  DATE OF SURGERY: 05/07/2013  PATIENT:  Wesley Burns,  47 y.o. male  PRE-OPERATIVE DIAGNOSIS:  Osteomyelitis and Ulcer Right Foot  POST-OPERATIVE DIAGNOSIS:  Osteomyelitis and Ulcer Right Foot  PROCEDURE:  Procedure(s): Revision Right Foot Midfoot Amputation  SURGEON:  Surgeon(s): Nadara Mustard, MD  ANESTHESIA:   general  EBL:  Minimal ML  SPECIMEN:  No Specimen  TOURNIQUET:  * No tourniquets in log *  PROCEDURE DETAILS: Patient is a 47 year old gentleman with abscess osteomyelitis status post right midfoot amputation. Due to failure conservative therapy he presents at this time for revision midfoot amputation. Risks and benefits were discussed including persistent infection neurovascular injury need for higher level amputation. Patient states he understands and wished to proceed at this time. Description of procedure patient was brought to the operating room and underwent a general anesthetic. After adequate levels of anesthesia were obtained patient's right lower extremity was prepped using DuraPrep and draped into a sterile field. A fishmouth incision was made to encompass the ulceration of the foot this was carried down to bone the metatarsals were resected through the base of the metatarsals. Electrocautery was used for hemostasis the wound was irrigated with normal saline. The tissue was healthy viable no abscess no necrotic tissue. The wound was closed using 2-0 nylon. The wound was covered with Adaptic orthopedic sponges AB dressing Kerlix and Coban. Patient was extubated taken to the PACU in stable condition plan for overnight observation.  PLAN OF CARE: Admit for overnight observation  PATIENT DISPOSITION:  PACU - hemodynamically stable.   Nadara Mustard, MD 05/07/2013 4:28 PM
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
## 2013-05-07 NOTE — Progress Notes (Signed)
Orthopedic Tech Progress Note Patient Details:  Wesley Burns 1966/10/08 [id number] Patient says he has a shoe at home. Patient ID: Wesley Burns, male   DOB: 03/21/1966, 47 y.o.   MRN: [id number]   Wesley Burns 05/07/2013, 8:44 PM

## 2013-05-07 NOTE — Anesthesia Procedure Notes (Signed)
Procedure Name: LMA Insertion Date/Time: 05/07/2013 3:30 PM Performed by: Ellin Goodie Pre-anesthesia Checklist: Patient identified, Emergency Drugs available, Suction available, Patient being monitored and Timeout performed Patient Re-evaluated:Patient Re-evaluated prior to inductionOxygen Delivery Method: Circle system utilized Preoxygenation: Pre-oxygenation with 100% oxygen Intubation Type: IV induction LMA: LMA inserted LMA Size: 5.0 Number of attempts: 1 Placement Confirmation: positive ETCO2 and breath sounds checked- equal and bilateral Tube secured with: Tape Dental Injury: Teeth and Oropharynx as per pre-operative assessment  Comments: Verified proper placement by Dr. Michelle Piper.  Carlynn Herald, CRNA

## 2013-05-07 NOTE — Progress Notes (Signed)
Patient had questions about consent form and would like to speak with Dr. Lajoyce Corners before signing consent. Also when questioned about starting bactroban ointment due to positive PCR during last visit (04/07/13), patient was unaware of staph in nares. Nurse started Bactroban ointment today, and informed patient that he needed to have another dose today and two more doses a day for the next 4 days. Patient verbalized understanding. Bactroban ointment placed on chart.

## 2013-05-07 NOTE — Anesthesia Preprocedure Evaluation (Addendum)
Anesthesia Evaluation  Patient identified by MRN, date of birth, ID band Patient awake    Reviewed: Allergy & Precautions, H&P , NPO status , Patient's Chart, lab work & pertinent test results, reviewed documented beta blocker date and time   History of Anesthesia Complications (+) PONV  Airway Mallampati: I TM Distance: >3 FB Neck ROM: full    Dental  (+) Teeth Intact and Dental Advisory Given   Pulmonary  breath sounds clear to auscultation        Cardiovascular hypertension, + Peripheral Vascular Disease Rhythm:regular Rate:Normal     Neuro/Psych Anxiety    GI/Hepatic GERD-  Medicated and Controlled,  Endo/Other  diabetes, Type 2, Oral Hypoglycemic Agents  Renal/GU      Musculoskeletal   Abdominal (+)  Abdomen: soft. Bowel sounds: normal.  Peds  Hematology  (+) Blood dyscrasia, ,   Anesthesia Other Findings   Reproductive/Obstetrics                        Anesthesia Physical Anesthesia Plan  ASA: III  Anesthesia Plan: General   Post-op Pain Management:    Induction: Intravenous  Airway Management Planned: LMA  Additional Equipment:   Intra-op Plan:   Post-operative Plan: Extubation in OR  Informed Consent: I have reviewed the patients History and Physical, chart, labs and discussed the procedure including the risks, benefits and alternatives for the proposed anesthesia with the patient or authorized representative who has indicated his/her understanding and acceptance.     Plan Discussed with: CRNA, Anesthesiologist and Surgeon  Anesthesia Plan Comments:         Anesthesia Quick Evaluation

## 2013-05-08 LAB — GLUCOSE, CAPILLARY

## 2013-05-08 NOTE — Evaluation (Signed)
Physical Therapy Evaluation Patient Details Name: Wesley Burns MRN: [id number] DOB: 07-05-1966 Today's Date: 05/08/2013 Time: 8295-6213 PT Time Calculation (min): 16 min  PT Assessment / Plan / Recommendation Clinical Impression  Pt is a 47 y.o. male s/p osteomyleits and R foot ulcer; pt had partial foot amputation May 7th 2014 and has been NWB on R LE since. Pt defers need for acute PT at this time. States he has all the equipment and (A) needed at home. Will benefit from HHPT to ensure safe transition home and increase indepdence.     PT Assessment  Patent does not need any further PT services    Follow Up Recommendations  Home health PT;Supervision for mobility/OOB;Supervision - Intermittent    Does the patient have the potential to tolerate intense rehabilitation      Barriers to Discharge  none      Equipment Recommendations  None recommended by PT    Recommendations for Other Services     Frequency      Precautions / Restrictions Precautions Precautions: Fall Restrictions Weight Bearing Restrictions: Yes RLE Weight Bearing: Non weight bearing   Pertinent Vitals/Pain C/o pain in L hand; reports fx in it from fall a few weeks prior.       Mobility  Bed Mobility Bed Mobility: Supine to Sit Supine to Sit: 6: Modified independent (Device/Increase time);HOB elevated Details for Bed Mobility Assistance: demo good technique with bed mobility  Transfers Transfers: Sit to Stand;Stand to Sit Sit to Stand: 5: Supervision;From bed Stand to Sit: 5: Supervision;To bed Details for Transfer Assistance: pt required min cues for hand placement and safety with RW Ambulation/Gait Ambulation/Gait Assistance: 5: Supervision Ambulation Distance (Feet): 40 Feet Assistive device: Rolling walker Ambulation/Gait Assistance Details: hop to due to NWB status on R LE; pt states he is amb like he was prior to this surgery; defers to increase amb due to fatigue Gait Pattern:  (hop to gt  pattern) Gait velocity: decreased due to pain and fatigue  Stairs: No (pt denied ) Wheelchair Mobility Wheelchair Mobility: No    Exercises     PT Diagnosis:    PT Problem List:   PT Treatment Interventions:     PT Goals    Visit Information  Last PT Received On: 05/08/13 Assistance Needed: +1    Subjective Data  Subjective: "this is all old news to me. i got this covered" agreeable to therapy to go home today Patient Stated Goal: home today   Prior Functioning  Home Living Lives With: Family;Friend(s) Available Help at Discharge: Family;Friend(s);Available 24 hours/day Type of Home: House Home Access: Stairs to enter [employer] of Steps: 2 Entrance Stairs-Rails: None Home Layout: Two level Alternate Level Stairs-Number of Steps: 16 Alternate Level Stairs-Rails: Can reach both Bathroom Shower/Tub: Engineer, manufacturing systems:  (uses 3 in 1) Bathroom Accessibility: Yes How Accessible: Accessible via wheelchair;Accessible via walker Home Adaptive Equipment: Bedside commode/3-in-1;Walker - rolling;Shower chair with back;Wheelchair - manual Prior Function Level of Independence: Needs assistance Needs Assistance:  ((A) needed for stair amb; primarily uses W/C) Able to Take Stairs?: Yes Driving: Yes Vocation: Works at home Comments: had partial foot amputation may 7th; has been NWB since and indepdedent with ADLs Communication Communication: No difficulties    Cognition  Cognition Arousal/Alertness: Awake/alert Behavior During Therapy: WFL for tasks assessed/performed Overall Cognitive Status: Within Functional Limits for tasks assessed    Extremity/Trunk Assessment Right Lower Extremity Assessment RLE ROM/Strength/Tone: Deficits;Unable to fully assess;Due to precautions;Due to pain RLE ROM/Strength/Tone  Deficits: able to perform LAQ; did not assess lowe leg due to precautions RLE Sensation: WFL - Light Touch Left Lower Extremity Assessment LLE  ROM/Strength/Tone: WFL for tasks assessed LLE Sensation: WFL - Light Touch Trunk Assessment Trunk Assessment: Normal   Balance Balance Balance Assessed: Yes Static Standing Balance Static Standing - Balance Support: Bilateral upper extremity supported;During functional activity Static Standing - Level of Assistance: 5: Stand by assistance  End of Session PT - End of Session Equipment Utilized During Treatment: Gait belt Activity Tolerance: Patient limited by fatigue Patient left: in bed;with call bell/phone within reach;Other (comment) (pt sitting on EOB) Nurse Communication: Mobility status  GP Functional Assessment Tool Used: clinical judgement  Functional Limitation: Mobility: Walking and moving around Mobility: Walking and Moving Around Current Status (Z6109): At least 1 percent but less than 20 percent impaired, limited or restricted Mobility: Walking and Moving Around Goal Status [phone]): At least 1 percent but less than 20 percent impaired, limited or restricted Mobility: Walking and Moving Around Discharge Status [phone]): At least 1 percent but less than 20 percent impaired, limited or restricted   Donell Sievert, [place] [phone] 05/08/2013, 8:15 AM

## 2013-05-08 NOTE — Progress Notes (Signed)
Subjective: 1 Day Post-Op Procedure(s) (LRB): Revision Right Foot Midfoot Amputation (Right) Awake alert oriented x4 wishes she could then home about 4 hours ago unfortunately resume acute care clinic. Patient is ready to be discharged it have to have some reinforcement of the dressing of the right foot when him remove the ABDs that were attached via tape and reapplied to ABDs with single Ace wrap some tape. Instead of being seen on Thursday, 05/13/2013 probably should be seen on Tuesday, 05/11/2013 for a return visit and dressing change. Patient reports pain as 3 on 0-10 scale.    Objective: Vital signs in last 24 hours: Temp:  [97.7 F (36.5 C)-98.2 F (36.8 C)] 98.2 F (36.8 C) (06/07 1404) Pulse Rate:  [72-114] 84 (06/07 1404) Resp:  [9-25] 16 (06/07 1404) BP: (130-186)/(75-117) 171/90 mmHg (06/07 1404) SpO2:  [98 %-100 %] 100 % (06/07 1404)  Intake/Output from previous day: 06/06 0701 - 06/07 0700 In: 1250 [I.V.:1000; IV Piggyback:250] Out: 625 [Urine:625] Intake/Output this shift: Total I/O In: 480 [P.O.:480] Out: 225 [Urine:225]   Recent Labs  05/07/13 1250  HGB 11.5*    Recent Labs  05/07/13 1250  WBC 3.9*  RBC 3.21*  HCT 32.2*  PLT 324    Recent Labs  05/07/13 1250  NA 134*  K 3.6  CL 97  CO2 26  BUN 4*  CREATININE 0.71  GLUCOSE 99  CALCIUM 9.0    Recent Labs  05/07/13 1250  INR 0.87    Neurologically intact ABD soft Dorsiflexion/Plantar flexion intact Incision: scant drainage and Plantar aspect right mid foot drainage bloody. Compartment soft  Assessment/Plan: 1 Day Post-Op Procedure(s) (LRB): Revision Right Foot Midfoot Amputation (Right) Advance diet Discharge home with home health Followup with Dr. Aldean Baker in 3 days for dressing change.  Wesley Burns E 05/08/2013, 4:27 PM

## 2013-05-10 ENCOUNTER — Encounter (HOSPITAL_COMMUNITY): Payer: Self-pay | Admitting: Orthopedic Surgery

## 2013-05-10 LAB — GLUCOSE, CAPILLARY: Glucose-Capillary: 97 mg/dL (ref 70–99)

## 2013-05-10 NOTE — Discharge Summary (Signed)
  Patient underwent outpatient surgery and was admitted for observation. Patient was discharged to home in stable condition followup in the office in one week.

## 2013-05-14 ENCOUNTER — Encounter (HOSPITAL_COMMUNITY): Payer: Self-pay | Admitting: Pharmacy Technician

## 2013-05-14 ENCOUNTER — Other Ambulatory Visit (HOSPITAL_COMMUNITY): Payer: Self-pay | Admitting: Orthopedic Surgery

## 2013-05-20 NOTE — Progress Notes (Signed)
I spoke with patient, he is post poneing surgery.  "I am getting a second opinion."  " I am going to call Dr Lajoyce Corners this am."

## 2013-05-21 ENCOUNTER — Encounter (HOSPITAL_COMMUNITY): Admission: RE | Payer: Self-pay | Source: Ambulatory Visit

## 2013-05-21 ENCOUNTER — Inpatient Hospital Stay (HOSPITAL_COMMUNITY): Admission: RE | Admit: 2013-05-21 | Payer: 59 | Source: Ambulatory Visit | Admitting: Orthopedic Surgery

## 2013-05-21 SURGERY — AMPUTATION BELOW KNEE
Anesthesia: General | Site: Leg Lower | Laterality: Right

## 2013-05-26 ENCOUNTER — Telehealth: Payer: Self-pay | Admitting: Family Medicine

## 2013-05-26 NOTE — Telephone Encounter (Signed)
Received fax refill request from [facility]  Lisinopril 40 mg  Last filled 05/21/13 #30  Metformin 100 mg last filled 05/21/13 #60

## 2013-05-29 ENCOUNTER — Emergency Department (HOSPITAL_COMMUNITY): Payer: 59 | Admitting: Anesthesiology

## 2013-05-29 ENCOUNTER — Encounter (HOSPITAL_COMMUNITY)
Admission: EM | Disposition: A | Discharge status: Home / Self Care | Payer: Self-pay | Source: Home / Self Care | Attending: Emergency Medicine

## 2013-05-29 ENCOUNTER — Emergency Department (HOSPITAL_COMMUNITY): Payer: 59

## 2013-05-29 ENCOUNTER — Emergency Department (HOSPITAL_COMMUNITY)
Admission: EM | Admit: 2013-05-29 | Discharge: 2013-05-30 | Disposition: A | Discharge status: Home / Self Care | Payer: 59 | Attending: Emergency Medicine | Admitting: Emergency Medicine

## 2013-05-29 ENCOUNTER — Encounter (HOSPITAL_COMMUNITY): Payer: Self-pay | Admitting: Emergency Medicine

## 2013-05-29 ENCOUNTER — Ambulatory Visit: Admit: 2013-05-29 | Payer: Self-pay | Admitting: Specialist

## 2013-05-29 ENCOUNTER — Encounter (HOSPITAL_COMMUNITY): Payer: Self-pay | Admitting: Anesthesiology

## 2013-05-29 DIAGNOSIS — T84029A Dislocation of unspecified internal joint prosthesis, initial encounter: Secondary | ICD-10-CM | POA: Insufficient documentation

## 2013-05-29 DIAGNOSIS — X58XXXA Exposure to other specified factors, initial encounter: Secondary | ICD-10-CM | POA: Insufficient documentation

## 2013-05-29 DIAGNOSIS — Z96649 Presence of unspecified artificial hip joint: Secondary | ICD-10-CM | POA: Insufficient documentation

## 2013-05-29 HISTORY — PX: HIP CLOSED REDUCTION: SHX983

## 2013-05-29 LAB — COMPREHENSIVE METABOLIC PANEL WITH GFR
ALT: 12 U/L (ref 0–53)
AST: 34 U/L (ref 0–37)
Albumin: 2.3 g/dL — ABNORMAL LOW (ref 3.5–5.2)
Alkaline Phosphatase: 168 U/L — ABNORMAL HIGH (ref 39–117)
BUN: 9 mg/dL (ref 6–23)
CO2: 31 meq/L (ref 19–32)
Calcium: 8.3 mg/dL — ABNORMAL LOW (ref 8.4–10.5)
Chloride: 92 meq/L — ABNORMAL LOW (ref 96–112)
Creatinine, Ser: 0.85 mg/dL (ref 0.50–1.35)
GFR calc Af Amer: >90 mL/min
GFR calc non Af Amer: >90 mL/min
Glucose, Bld: 150 mg/dL — ABNORMAL HIGH (ref 70–99)
Potassium: 4 meq/L (ref 3.5–5.1)
Sodium: 133 meq/L — ABNORMAL LOW (ref 135–145)
Total Bilirubin: 0.2 mg/dL — ABNORMAL LOW (ref 0.3–1.2)
Total Protein: 5.9 g/dL — ABNORMAL LOW (ref 6.0–8.3)

## 2013-05-29 LAB — CBC WITH DIFFERENTIAL/PLATELET
HCT: 34.4 % — ABNORMAL LOW (ref 39.0–52.0)
Hemoglobin: 12.6 g/dL — ABNORMAL LOW (ref 13.0–17.0)
Lymphocytes Relative: 20 % (ref 12–46)
Monocytes Absolute: 0.8 10*3/uL (ref 0.1–1.0)
Monocytes Relative: 10 % (ref 3–12)
Neutro Abs: 5.3 10*3/uL (ref 1.7–7.7)
Neutrophils Relative %: 69 % (ref 43–77)
RBC: 3.51 MIL/uL — ABNORMAL LOW (ref 4.22–5.81)
WBC: 7.7 10*3/uL (ref 4.0–10.5)

## 2013-05-29 SURGERY — CLOSED MANIPULATION, JOINT, HIP
Anesthesia: General | Site: Hip | Laterality: Right | Wound class: Clean

## 2013-05-29 MED ORDER — MIDAZOLAM HCL 5 MG/5ML IJ SOLN
INTRAMUSCULAR | Status: DC | PRN
  Administered 2013-05-29: 2 mg via INTRAVENOUS

## 2013-05-29 MED ORDER — HYDROCODONE-ACETAMINOPHEN 5-325 MG PO TABS: 1.0000 | ORAL_TABLET | ORAL | Status: DC | PRN

## 2013-05-29 MED ORDER — SUCCINYLCHOLINE CHLORIDE 20 MG/ML IJ SOLN
INTRAMUSCULAR | Status: DC | PRN
  Administered 2013-05-29: 100 mg via INTRAVENOUS

## 2013-05-29 MED ORDER — ONDANSETRON HCL 4 MG/2ML IJ SOLN
4.0000 mg | Freq: Once | INTRAMUSCULAR | Status: AC
  Administered 2013-05-29: 4 mg via INTRAVENOUS
  Filled 2013-05-29: qty 2

## 2013-05-29 MED ORDER — CYCLOBENZAPRINE HCL 10 MG PO TABS: 10.0000 mg | ORAL_TABLET | Freq: Three times a day (TID) | ORAL | Status: DC | PRN

## 2013-05-29 MED ORDER — ONDANSETRON HCL 4 MG/2ML IJ SOLN
INTRAMUSCULAR | Status: DC | PRN
  Administered 2013-05-29 (×2): 4 mg via INTRAVENOUS

## 2013-05-29 MED ORDER — PROPOFOL 10 MG/ML IV BOLUS
INTRAVENOUS | Status: DC | PRN
  Administered 2013-05-29: 160 mg via INTRAVENOUS

## 2013-05-29 MED ORDER — PROMETHAZINE HCL 25 MG PO TABS: 25.0000 mg | ORAL_TABLET | Freq: Four times a day (QID) | ORAL | Status: DC | PRN

## 2013-05-29 MED ORDER — HYDROMORPHONE HCL PF 1 MG/ML IJ SOLN
0.2500 mg | INTRAMUSCULAR | Status: DC | PRN
  Administered 2013-05-30 (×2): 0.5 mg via INTRAVENOUS

## 2013-05-29 MED ORDER — MORPHINE SULFATE 4 MG/ML IJ SOLN
4.0000 mg | Freq: Once | INTRAMUSCULAR | Status: AC
  Administered 2013-05-29: 4 mg via INTRAVENOUS
  Filled 2013-05-29: qty 1

## 2013-05-29 MED ORDER — PROMETHAZINE HCL 25 MG/ML IJ SOLN: 6.2500 mg | Freq: Once | INTRAMUSCULAR | Status: DC

## 2013-05-29 MED ORDER — LACTATED RINGERS IV SOLN
INTRAVENOUS | Status: DC | PRN
  Administered 2013-05-29: 23:00:00 via INTRAVENOUS

## 2013-05-29 MED ORDER — CEFAZOLIN SODIUM-DEXTROSE 2-3 GM-% IV SOLR: 2.0000 g | INTRAVENOUS | Status: DC

## 2013-05-29 MED ORDER — LIDOCAINE HCL (CARDIAC) 20 MG/ML IV SOLN
INTRAVENOUS | Status: DC | PRN
  Administered 2013-05-29: 40 mg via INTRAVENOUS

## 2013-05-29 SURGICAL SUPPLY — 2 items
IMMOBILIZER KNEE 22  40 CIR (ORTHOPEDIC SUPPLIES) ×1
IMMOBILIZER KNEE 22 40 CIR (ORTHOPEDIC SUPPLIES) IMPLANT

## 2013-05-29 NOTE — ED Notes (Signed)
Pt with 1 episode of emesis, pt states feels better and declines nausea medication at this time.

## 2013-05-29 NOTE — Anesthesia Preprocedure Evaluation (Addendum)
Anesthesia Evaluation  Patient identified by MRN, date of birth, ID band Patient awake    Reviewed: Allergy & Precautions, H&P , NPO status , Patient's Chart, lab work & pertinent test results  History of Anesthesia Complications (+) PONV  Airway Mallampati: I TM Distance: >3 FB Neck ROM: Full    Dental no notable dental hx. (+) Teeth Intact and Dental Advisory Given   Pulmonary neg pulmonary ROS,  breath sounds clear to auscultation  Pulmonary exam normal       Cardiovascular hypertension, On Medications Rhythm:Regular Rate:Normal     Neuro/Psych negative neurological ROS  negative psych ROS   GI/Hepatic Neg liver ROS, GERD-  Controlled,  Endo/Other  diabetes, Type 2, Oral Hypoglycemic Agents  Renal/GU negative Renal ROS  negative genitourinary   Musculoskeletal   Abdominal   Peds  Hematology negative hematology ROS (+)   Anesthesia Other Findings   Reproductive/Obstetrics negative OB ROS                         Anesthesia Physical Anesthesia Plan  ASA: III  Anesthesia Plan: General   Post-op Pain Management:    Induction: Intravenous, Rapid sequence and Cricoid pressure planned  Airway Management Planned: Oral ETT  Additional Equipment:   Intra-op Plan:   Post-operative Plan: Extubation in OR  Informed Consent: I have reviewed the patients History and Physical, chart, labs and discussed the procedure including the risks, benefits and alternatives for the proposed anesthesia with the patient or authorized representative who has indicated his/her understanding and acceptance.   Dental advisory given  Plan Discussed with: CRNA  Anesthesia Plan Comments:        Anesthesia Quick Evaluation

## 2013-05-29 NOTE — Transfer of Care (Signed)
Immediate Anesthesia Transfer of Care Note  Patient: Wesley Burns  Procedure(s) Performed: Procedure(s): CLOSED MANIPULATION HIP (Right)  Patient Location: PACU  Anesthesia Type:General  Level of Consciousness: oriented, sedated, patient cooperative and responds to stimulation  Airway & Oxygen Therapy: Patient Spontanous Breathing and Patient connected to nasal cannula oxygen  Post-op Assessment: Report given to PACU RN, Post -op Vital signs reviewed and stable, Patient moving all extremities and Patient moving all extremities X 4  Post vital signs: Reviewed and stable  Complications: No apparent anesthesia complications

## 2013-05-29 NOTE — Preoperative (Signed)
Beta Blockers   Reason not to administer Beta Blockers:Not Applicable 

## 2013-05-29 NOTE — Anesthesia Procedure Notes (Signed)
Procedure Name: Intubation Date/Time: 05/29/2013 11:25 PM Performed by: Wray Kearns A Pre-anesthesia Checklist: Patient identified, Emergency Drugs available, Suction available, Patient being monitored and Timeout performed Patient Re-evaluated:Patient Re-evaluated prior to inductionOxygen Delivery Method: Circle system utilized Preoxygenation: Pre-oxygenation with 100% oxygen Intubation Type: IV induction, Rapid sequence and Cricoid Pressure applied Laryngoscope Size: Mac and 4 Grade View: Grade I Tube type: Oral Tube size: 7.5 mm Number of attempts: 1 Airway Equipment and Method: Stylet Placement Confirmation: ETT inserted through vocal cords under direct vision,  positive ETCO2 and breath sounds checked- equal and bilateral Secured at: 23 cm Tube secured with: Tape Dental Injury: Teeth and Oropharynx as per pre-operative assessment
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
## 2013-05-29 NOTE — ED Notes (Signed)
Onset today right hip pain history of bilateral hip replacements. EMS started IV Left FA 18g given total of Fentanyl. Pain improved however developed nausea and emesis x1.

## 2013-05-29 NOTE — Op Note (Signed)
Preop diagnosis dislocated right total hip arthroplasty Postoperative diagnosis same Procedure closed reduction dislocated total hip arthroplasty. Exam under anesthesia. C-arm radiography. Surgeon Valma Cava M.D. assistant [facility] Anesthesia Gen. Complications none Disposition PACU stable.  Operative details ice was counseled in the holding area. Correct side marked. Reviewed and signed appropriately. Patient was then taken to the operating room posterior general anesthesia. Examinations are shortened internally rotated lower extremity. He'll anesthesia with ice of the pelvis of flexion longitudinal traction and gentle internal and extra rotation the hip was reduced. Examination revealed his stay with 90 of flexion 40 was exhortation 40 internal rotation. C-arm was utilized to confirm anatomic reduction of the dislocated total hip arthroplasty and no complicating features. He was placed in the immobilizer. Addition for his right lower extremity with foot area the dressing was changed as awakened tight from operating room to PACU in stable condition. Plan he we stabilized the PACU discharge to home with stable. He will followup in office in the next couple of weeks. Due to the fact Dr. Leslee Home is retiring referred to one of our hip subspecialist.

## 2013-05-29 NOTE — ED Provider Notes (Signed)
Medical screening examination/treatment/procedure(s) were performed by non-physician practitioner and as supervising physician I was immediately available for consultation/collaboration.   Jalisha Enneking, MD 05/29/13 2329 

## 2013-05-29 NOTE — Anesthesia Postprocedure Evaluation (Signed)
  Anesthesia Post-op Note  Patient: Wesley Burns  Procedure(s) Performed: Procedure(s): CLOSED MANIPULATION HIP (Right)  Patient Location: PACU  Anesthesia Type:General  Level of Consciousness: awake  Airway and Oxygen Therapy: Patient Spontanous Breathing and Patient connected to nasal cannula oxygen  Post-op Pain: none  Post-op Assessment: Post-op Vital signs reviewed, Patient's Cardiovascular Status Stable, Respiratory Function Stable and Patent Airway  Post-op Vital Signs: Reviewed and stable  Complications: No apparent anesthesia complications

## 2013-05-30 LAB — COMPREHENSIVE METABOLIC PANEL
Alkaline Phosphatase: 162 U/L — ABNORMAL HIGH (ref 39–117)
BUN: 7 mg/dL (ref 6–23)
Chloride: 93 mEq/L — ABNORMAL LOW (ref 96–112)
GFR calc Af Amer: >90 mL/min (ref >90)
Glucose, Bld: 107 mg/dL — ABNORMAL HIGH (ref 70–99)
Potassium: 3.4 mEq/L — ABNORMAL LOW (ref 3.5–5.1)
Total Bilirubin: 0.3 mg/dL (ref 0.3–1.2)
Total Protein: 5.8 g/dL — ABNORMAL LOW (ref 6.0–8.3)

## 2013-05-30 LAB — APTT: aPTT: 28 seconds (ref 24–37)

## 2013-05-30 LAB — CBC
HCT: 35 % — ABNORMAL LOW (ref 39.0–52.0)
Hemoglobin: 12.5 g/dL — ABNORMAL LOW (ref 13.0–17.0)
MCHC: 35.7 g/dL (ref 30.0–36.0)

## 2013-05-30 LAB — PROTIME-INR: Prothrombin Time: 12.2 seconds (ref 11.6–15.2)

## 2013-05-30 MED ORDER — HYDROMORPHONE HCL PF 1 MG/ML IJ SOLN
INTRAMUSCULAR | Status: AC
  Filled 2013-05-30: qty 1

## 2013-05-30 NOTE — Telephone Encounter (Signed)
These are to be denied.  He hasn't been seen in this office (and only was seen once) in over a year.

## 2013-05-30 NOTE — Anesthesia Postprocedure Evaluation (Signed)
  Anesthesia Post-op Note  Patient: Wesley Burns  Procedure(s) Performed: Procedure(s): CLOSED MANIPULATION HIP (Right)  Patient Location: PACU  Anesthesia Type:General  Level of Consciousness: awake, oriented, patient cooperative and responds to stimulation  Airway and Oxygen Therapy: Patient Spontanous Breathing and Patient connected to nasal cannula oxygen  Post-op Pain: mild  Post-op Assessment: Post-op Vital signs reviewed, Patient's Cardiovascular Status Stable, Respiratory Function Stable, Patent Airway, No signs of Nausea or vomiting and Pain level controlled  Post-op Vital Signs: Reviewed and stable  Complications: No apparent anesthesia complications

## 2013-05-31 NOTE — Telephone Encounter (Signed)
Spoke with Rib, pharmacist.

## 2013-06-01 ENCOUNTER — Encounter (HOSPITAL_COMMUNITY): Payer: Self-pay | Admitting: Specialist

## 2013-06-30 ENCOUNTER — Emergency Department (HOSPITAL_COMMUNITY): Payer: 59

## 2013-06-30 ENCOUNTER — Emergency Department (HOSPITAL_COMMUNITY)
Admission: EM | Admit: 2013-06-30 | Discharge: 2013-07-01 | Disposition: A | Discharge status: Home / Self Care | Payer: 59 | Attending: Emergency Medicine | Admitting: Emergency Medicine

## 2013-06-30 ENCOUNTER — Encounter (HOSPITAL_COMMUNITY): Payer: Self-pay

## 2013-06-30 DIAGNOSIS — F17200 Nicotine dependence, unspecified, uncomplicated: Secondary | ICD-10-CM | POA: Insufficient documentation

## 2013-06-30 DIAGNOSIS — Y929 Unspecified place or not applicable: Secondary | ICD-10-CM | POA: Insufficient documentation

## 2013-06-30 DIAGNOSIS — Z8639 Personal history of other endocrine, nutritional and metabolic disease: Secondary | ICD-10-CM | POA: Insufficient documentation

## 2013-06-30 DIAGNOSIS — Y838 Other surgical procedures as the cause of abnormal reaction of the patient, or of later complication, without mention of misadventure at the time of the procedure: Secondary | ICD-10-CM | POA: Insufficient documentation

## 2013-06-30 DIAGNOSIS — Z8619 Personal history of other infectious and parasitic diseases: Secondary | ICD-10-CM | POA: Insufficient documentation

## 2013-06-30 DIAGNOSIS — S98919A Complete traumatic amputation of unspecified foot, level unspecified, initial encounter: Secondary | ICD-10-CM | POA: Insufficient documentation

## 2013-06-30 DIAGNOSIS — Z862 Personal history of diseases of the blood and blood-forming organs and certain disorders involving the immune mechanism: Secondary | ICD-10-CM | POA: Insufficient documentation

## 2013-06-30 DIAGNOSIS — Z872 Personal history of diseases of the skin and subcutaneous tissue: Secondary | ICD-10-CM | POA: Insufficient documentation

## 2013-06-30 DIAGNOSIS — X58XXXA Exposure to other specified factors, initial encounter: Secondary | ICD-10-CM | POA: Insufficient documentation

## 2013-06-30 DIAGNOSIS — Z8739 Personal history of other diseases of the musculoskeletal system and connective tissue: Secondary | ICD-10-CM | POA: Insufficient documentation

## 2013-06-30 DIAGNOSIS — Z87820 Personal history of traumatic brain injury: Secondary | ICD-10-CM | POA: Insufficient documentation

## 2013-06-30 DIAGNOSIS — M25459 Effusion, unspecified hip: Secondary | ICD-10-CM | POA: Insufficient documentation

## 2013-06-30 DIAGNOSIS — E1169 Type 2 diabetes mellitus with other specified complication: Secondary | ICD-10-CM | POA: Insufficient documentation

## 2013-06-30 DIAGNOSIS — R269 Unspecified abnormalities of gait and mobility: Secondary | ICD-10-CM | POA: Insufficient documentation

## 2013-06-30 DIAGNOSIS — Z8659 Personal history of other mental and behavioral disorders: Secondary | ICD-10-CM | POA: Insufficient documentation

## 2013-06-30 DIAGNOSIS — Z8719 Personal history of other diseases of the digestive system: Secondary | ICD-10-CM | POA: Insufficient documentation

## 2013-06-30 DIAGNOSIS — I10 Essential (primary) hypertension: Secondary | ICD-10-CM | POA: Insufficient documentation

## 2013-06-30 DIAGNOSIS — Z96649 Presence of unspecified artificial hip joint: Secondary | ICD-10-CM | POA: Insufficient documentation

## 2013-06-30 DIAGNOSIS — S73004A Unspecified dislocation of right hip, initial encounter: Secondary | ICD-10-CM

## 2013-06-30 DIAGNOSIS — Y9389 Activity, other specified: Secondary | ICD-10-CM | POA: Insufficient documentation

## 2013-06-30 DIAGNOSIS — Z79899 Other long term (current) drug therapy: Secondary | ICD-10-CM | POA: Insufficient documentation

## 2013-06-30 DIAGNOSIS — Z8669 Personal history of other diseases of the nervous system and sense organs: Secondary | ICD-10-CM | POA: Insufficient documentation

## 2013-06-30 DIAGNOSIS — Z7982 Long term (current) use of aspirin: Secondary | ICD-10-CM | POA: Insufficient documentation

## 2013-06-30 DIAGNOSIS — S73006A Unspecified dislocation of unspecified hip, initial encounter: Secondary | ICD-10-CM | POA: Insufficient documentation

## 2013-06-30 DIAGNOSIS — Z9889 Other specified postprocedural states: Secondary | ICD-10-CM | POA: Insufficient documentation

## 2013-06-30 LAB — POCT I-STAT, CHEM 8
BUN: <3 mg/dL — ABNORMAL LOW (ref 6–23)
Calcium, Ion: 1.51 mmol/L — ABNORMAL HIGH (ref 1.12–1.23)
Creatinine, Ser: 0.9 mg/dL (ref 0.50–1.35)
TCO2: 36 mmol/L (ref 0–100)

## 2013-06-30 MED ORDER — SODIUM CHLORIDE 0.9 % IV BOLUS (SEPSIS)
1000.0000 mL | Freq: Once | INTRAVENOUS | Status: AC
  Administered 2013-06-30: 1000 mL via INTRAVENOUS

## 2013-06-30 MED ORDER — HYDROMORPHONE HCL PF 1 MG/ML IJ SOLN
1.0000 mg | Freq: Once | INTRAMUSCULAR | Status: AC
  Administered 2013-06-30: 1 mg via INTRAVENOUS
  Filled 2013-06-30: qty 1

## 2013-06-30 MED ORDER — PROPOFOL 10 MG/ML IV BOLUS
0.5000 mg/kg | Freq: Once | INTRAVENOUS | Status: AC
  Administered 2013-07-01: 70 mg via INTRAVENOUS
  Filled 2013-06-30: qty 2

## 2013-06-30 MED ORDER — FENTANYL CITRATE 0.05 MG/ML IJ SOLN
100.0000 ug | Freq: Once | INTRAMUSCULAR | Status: DC
  Filled 2013-06-30: qty 2

## 2013-06-30 MED ORDER — ONDANSETRON HCL 4 MG/2ML IJ SOLN
4.0000 mg | Freq: Once | INTRAMUSCULAR | Status: AC
  Administered 2013-06-30: 4 mg via INTRAVENOUS
  Filled 2013-06-30: qty 2
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
## 2013-06-30 NOTE — ED Notes (Signed)
Per EMS, pt from home.  Has hx of surgery to hips and has had dislocation prior on hip.  Today, turned over in bed and felt pop.    Rt hip painful and rt foot shorter.  Pt also has diabetic foot with recent surgery and dressing in place.  Left hand 20 g IV.  Fentanyl 150 mcg given in route.  Vitals:  172 110,  92, 18  Pain now 5/10

## 2013-06-30 NOTE — ED Notes (Signed)
[id number]<AV> Expected date:<BR> Expected time:<BR> Means of arrival:<BR> Comments:<BR> EMS rt hip deformity; hx of hip replacement

## 2013-07-01 ENCOUNTER — Emergency Department (HOSPITAL_COMMUNITY): Payer: 59

## 2013-07-01 MED ORDER — PROPOFOL 10 MG/ML IV BOLUS
INTRAVENOUS | Status: AC | PRN
  Administered 2013-07-01: 30 mg via INTRAVENOUS

## 2013-07-01 MED ORDER — HYDROCODONE-ACETAMINOPHEN 5-325 MG PO TABS: 2.0000 | ORAL_TABLET | Freq: Four times a day (QID) | ORAL | Status: DC | PRN

## 2013-07-01 MED ORDER — PROMETHAZINE HCL 25 MG PO TABS: 25.0000 mg | ORAL_TABLET | Freq: Four times a day (QID) | ORAL | Status: DC | PRN

## 2013-07-01 NOTE — ED Notes (Signed)
Immobilizer instructions given.

## 2013-07-01 NOTE — ED Notes (Signed)
Closed reduction complete.  Pt returned to baseline. Vitals stable.

## 2013-07-02 NOTE — ED Provider Notes (Signed)
Medical screening examination/treatment/procedure(s) were conducted as a shared visit with non-physician practitioner(s) and myself.  I personally evaluated the patient during the encounter  R hip defomity c/w dislocation by exam. Heart RRR, lungs CTA, oral pharynx clear.  Imaging reviewed.   Procedural sedation Date/Time: 07/01/2013 12:34 AM  Performed by: Sunnie Nielsen  Authorized by: Sunnie Nielsen  Consent: Verbal consent obtained. Risks and benefits: risks, benefits and alternatives were discussed Consent given by: patient  Patient understanding: patient states understanding of the procedure being performed  Patient consent: the patient's understanding of the procedure matches consent given  Procedure consent: procedure consent matches procedure scheduled  Required items: required blood products, implants, devices, and special equipment available  Patient identity confirmed: verbally with patient  Time out: Immediately prior to procedure a "time out" was called to verify the correct patient, procedure, equipment, support staff and site/side marked as required. Preparation: Patient was prepped and draped in the usual sterile fashion. Patient sedated: yes Sedatives: propofol Analgesia: fentanyl Sedation end date/time: 07/01/2013 12:34 AM Vitals: Vital signs were monitored during sedation. Patient tolerance: Patient tolerated the procedure well with no immediate complications. Comments: See RN notes for further details  Reduction of dislocation Date/Time: 07/01/2013 12:35 AM  Performed by: Sunnie Nielsen  Authorized by: Sunnie Nielsen  Consent: Verbal consent obtained. Risks and benefits: risks, benefits and alternatives were discussed Consent given by: patient  Patient understanding: patient states understanding of the procedure being performed  Patient consent: the patient's understanding of the procedure matches consent given  Procedure consent: procedure consent matches procedure  scheduled  Required items: required blood products, implants, devices, and special equipment available  Patient identity confirmed: verbally with patient  Time out: Immediately prior to procedure a "time out" was called to verify the correct patient, procedure, equipment, support staff and site/side marked as required. Preparation: Patient was prepped and draped in the usual sterile fashion. Patient tolerance: Patient tolerated the procedure well with no immediate complications. Comments: R hip prosthesis reduced with traction and gentle int/ext rotation of the hip (including critical care time)  Results for orders placed during the hospital encounter of 06/30/13  GLUCOSE, CAPILLARY      Result Value Range   Glucose-Capillary 146 (*) 70 - 99 mg/dL  POCT I-STAT, CHEM 8      Result Value Range   Sodium 130 (*) 135 - 145 mEq/L   Potassium 3.3 (*) 3.5 - 5.1 mEq/L   Chloride 86 (*) 96 - 112 mEq/L   BUN <3 (*) 6 - 23 mg/dL   Creatinine, Ser 1.61  0.50 - 1.35 mg/dL   Glucose, Bld 096 (*) 70 - 99 mg/dL   Calcium, Ion 0.45 (*) 1.12 - 1.23 mmol/L   TCO2 36  0 - 100 mmol/L   Hemoglobin 14.6  13.0 - 17.0 g/dL   HCT 40.9  81.1 - 91.4 %   Dg Hip Complete Right  07/01/2013   *RADIOLOGY REPORT*  Clinical Data: Right hip pain.  RIGHT HIP - COMPLETE 2+ VIEW  Comparison: 05/29/2013 radiographs.  Findings: Posterior dislocation of the right total hip arthroplasty.  Left total hip arthroplasty remains located with large amount of heterotopic bone.  There is no fracture. Osteopenia.  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   Original Report Authenticated By: Andreas Newport, M.D.   Dg Hip Portable 1 View Right  07/01/2013   *RADIOLOGY REPORT*  Clinical Data: Hip dislocation.  PORTABLE RIGHT HIP - 1 VIEW  Comparison: 06/30/2013.  Findings:  Successful reduction of the right posterior hip dislocation.  No hardware complication identified.  IMPRESSION: Successful reduction of  right posterior hip dislocation identified on single frontal view.   Original Report Authenticated By: Andreas Newport, M.D.     I performed procedural sedation and hip reduction as above.  MLP d/w Ortho and PT discharged in stable condition.   Sunnie Nielsen, MD 07/02/13 [phone]

## 2013-07-19 ENCOUNTER — Telehealth: Payer: Self-pay | Admitting: Family Medicine

## 2013-07-19 NOTE — Telephone Encounter (Signed)
He was seen once in this office in 04/2012.  I truly don't consider myself his PCP. He clearly has many other doctors.  He needs to be seeing a PCP, just not sure who he has seen in the last year. I denied his refill request in June, as he hadn't been seen in over a year.  If I am supposed to be his PCP, then he needs OV (30 min)

## 2013-07-19 NOTE — Telephone Encounter (Signed)
ANGELA JONES WHO IS WITH [facility] CALLED WITH A STATUS UPDATE ON  THIS PT. SHE STATES THAT PT'S BP WAS ELEVATED TODAY. IT WAS 182/94. Friday WHEN SHE CHECK IT THE READING WAS 158/88. SHE STATES SHE INQUIRED ABOUT HIS MEDICATION AND PT RESPONDED HE HAD NOT TAKEN IT TODAY HE TAKES AT NIGHT. SHE IS CONCERNED ABOUT THAT BECAUSE SHE STATES PT IS NONCOMPLIANT CONCERNING OTHER ISSUES. SHE WAS CALLING TO INFORM us OF HIS READINGS. YOU CAN REACH HER AT [phone].

## 2013-07-20 NOTE — Telephone Encounter (Signed)
The [facility] nurse was called and I spoke to her concerning this pt. She states to her knowledge this pt is still a pt of ours. She states that she was referred to care from this pt from the wound care center. I then called the pt and left a message that he needed to call the office to schedule an appt.

## 2013-07-22 ENCOUNTER — Ambulatory Visit: Payer: Self-pay | Admitting: Family Medicine

## 2013-07-28 NOTE — Telephone Encounter (Signed)
PT MADE AN APPT

## 2013-07-29 ENCOUNTER — Other Ambulatory Visit: Payer: Self-pay | Admitting: Family Medicine

## 2013-07-29 ENCOUNTER — Encounter: Payer: Self-pay | Admitting: Family Medicine

## 2013-07-29 ENCOUNTER — Ambulatory Visit (INDEPENDENT_AMBULATORY_CARE_PROVIDER_SITE_OTHER): Payer: 59 | Admitting: Family Medicine

## 2013-07-29 VITALS — BP 176/110 | HR 92 | Temp 98.0°F | Ht 71.0 in | Wt 156.0 lb

## 2013-07-29 DIAGNOSIS — I10 Essential (primary) hypertension: Secondary | ICD-10-CM

## 2013-07-29 DIAGNOSIS — Z9114 Patient's other noncompliance with medication regimen: Secondary | ICD-10-CM

## 2013-07-29 DIAGNOSIS — M869 Osteomyelitis, unspecified: Secondary | ICD-10-CM

## 2013-07-29 DIAGNOSIS — IMO0001 Reserved for inherently not codable concepts without codable children: Secondary | ICD-10-CM

## 2013-07-29 DIAGNOSIS — Z91148 Patient's other noncompliance with medication regimen for other reason: Secondary | ICD-10-CM

## 2013-07-29 DIAGNOSIS — R809 Proteinuria, unspecified: Secondary | ICD-10-CM

## 2013-07-29 DIAGNOSIS — Z9119 Patient's noncompliance with other medical treatment and regimen: Secondary | ICD-10-CM

## 2013-07-29 DIAGNOSIS — E1059 Type 1 diabetes mellitus with other circulatory complications: Secondary | ICD-10-CM

## 2013-07-29 DIAGNOSIS — M9080 Osteopathy in diseases classified elsewhere, unspecified site: Secondary | ICD-10-CM

## 2013-07-29 DIAGNOSIS — E1129 Type 2 diabetes mellitus with other diabetic kidney complication: Secondary | ICD-10-CM

## 2013-07-29 DIAGNOSIS — E1169 Type 2 diabetes mellitus with other specified complication: Secondary | ICD-10-CM

## 2013-07-29 LAB — CBC WITH DIFFERENTIAL/PLATELET
Eosinophils Absolute: 0.4 10*3/uL (ref 0.0–0.7)
Eosinophils Relative: 5 % (ref 0–5)
Hemoglobin: 14.1 g/dL (ref 13.0–17.0)
Lymphs Abs: 2 10*3/uL (ref 0.7–4.0)
MCH: 32.3 pg (ref 26.0–34.0)
MCHC: 34.2 g/dL (ref 30.0–36.0)
MCV: 94.5 fL (ref 78.0–100.0)
Monocytes Relative: 11 % (ref 3–12)
RBC: 4.36 MIL/uL (ref 4.22–5.81)

## 2013-07-29 NOTE — Patient Instructions (Addendum)
Call [facility] and schedule diabetic eye exam. Ask home health folks to help you set up pill bottles for your routine medications (diabetes, blood pressure)  Please work on stopping smoking completely  Low sodium diet Take your medication every day. Write down your blood pressures every time it is checked. Bring your list of blood pressures and your bottles/pill box to your next visit.  Return in 2 weeks.  Sodium-Controlled Diet Sodium is a mineral. It is found in many foods. Sodium may be found naturally or added during the making of a food. The most common form of sodium is salt, which is made up of sodium and chloride. Reducing your sodium intake involves changing your eating habits. The following guidelines will help you reduce the sodium in your diet:  Stop using the salt shaker.  Use salt sparingly in cooking and baking.  Substitute with sodium-free seasonings and spices.  Do not use a salt substitute (potassium chloride) without your caregiver's permission.  Include a variety of fresh, unprocessed foods in your diet.  Limit the use of processed and convenience foods that are high in sodium. USE THE FOLLOWING FOODS SPARINGLY: Breads/Starches  Commercial bread stuffing, commercial pancake or waffle mixes, coating mixes. Waffles. Croutons. Prepared (boxed or frozen) potato, rice, or noodle mixes that contain salt or sodium. Salted [place] fries or hash browns. Salted popcorn, breads, crackers, chips, or snack foods. Vegetables  Vegetables canned with salt or prepared in cream, butter, or cheese sauces. Sauerkraut. Tomato or vegetable juices canned with salt.  Fresh vegetables are allowed if rinsed thoroughly. Fruit  Fruit is okay to eat. Meat and Meat Substitutes  Salted or smoked meats, such as bacon or Canadian bacon, chipped or corned beef, hot dogs, salt pork, luncheon meats, pastrami, ham, or sausage. Canned or smoked fish, poultry, or meat. Processed  cheese or cheese spreads, blue or Roquefort cheese. Battered or frozen fish products. Prepared spaghetti sauce. Baked beans. Reuben sandwiches. Salted nuts. Caviar. Milk  Limit buttermilk to 1 cup per week. Soups and Combination Foods  Bouillon cubes, canned or dried soups, broth, consomm. Convenience (frozen or packaged) dinners with more than 600 mg sodium. Pot pies, pizza, Asian food, fast food cheeseburgers, and specialty sandwiches. Desserts and Sweets  Regular (salted) desserts, pie, commercial fruit snack pies, commercial snack cakes, canned puddings.  Eat desserts and sweets in moderation. Fats and Oils  Gravy mixes or canned gravy. No more than 1 to 2 tbs of salad dressing. Chip dips.  Eat fats and oils in moderation. Beverages  See those listed under the vegetables and milk groups. Condiments  Ketchup, mustard, meat sauces, salsa, regular (salted) and lite soy sauce or mustard. Dill pickles, olives, meat tenderizer. Prepared horseradish or pickle relish. Dutch-processed cocoa. Baking powder or baking soda used medicinally. Worcestershire sauce. "Light" salt. Salt substitute, unless approved by your caregiver. Document Released: 05/10/2002 Document Revised: 02/10/2012 Document Reviewed: 12/11/2009 [facility] Patient Information 2014 [place], [place].
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
## 2013-07-30 ENCOUNTER — Other Ambulatory Visit: Payer: Self-pay | Admitting: Family Medicine

## 2013-07-30 ENCOUNTER — Encounter: Payer: Self-pay | Admitting: Family Medicine

## 2013-07-30 ENCOUNTER — Telehealth: Payer: Self-pay | Admitting: Family Medicine

## 2013-07-30 DIAGNOSIS — R809 Proteinuria, unspecified: Secondary | ICD-10-CM | POA: Insufficient documentation

## 2013-07-30 DIAGNOSIS — Z9114 Patient's other noncompliance with medication regimen: Secondary | ICD-10-CM | POA: Insufficient documentation

## 2013-07-30 DIAGNOSIS — E11610 Type 2 diabetes mellitus with diabetic neuropathic arthropathy: Secondary | ICD-10-CM | POA: Insufficient documentation

## 2013-07-30 LAB — COMPREHENSIVE METABOLIC PANEL
AST: 51 U/L — ABNORMAL HIGH (ref 0–37)
Alkaline Phosphatase: 121 U/L — ABNORMAL HIGH (ref 39–117)
Glucose, Bld: 130 mg/dL — ABNORMAL HIGH (ref 70–99)
Sodium: 137 mEq/L (ref 135–145)
Total Bilirubin: 1 mg/dL (ref 0.3–1.2)
Total Protein: 6.5 g/dL (ref 6.0–8.3)

## 2013-07-30 LAB — MICROALBUMIN / CREATININE URINE RATIO: Microalb, Ur: 153.15 mg/dL — ABNORMAL HIGH (ref 0.00–1.89)

## 2013-07-30 LAB — LIPID PANEL
HDL: 93 mg/dL (ref >39)
LDL Cholesterol: 45 mg/dL (ref 0–99)
Total CHOL/HDL Ratio: 1.8 Ratio
VLDL: 34 mg/dL (ref 0–40)

## 2013-07-30 MED ORDER — METFORMIN HCL 1000 MG PO TABS: 1000.0000 mg | ORAL_TABLET | Freq: Two times a day (BID) | ORAL | Status: DC

## 2013-07-30 MED ORDER — LISINOPRIL 40 MG PO TABS: 40.0000 mg | ORAL_TABLET | Freq: Every day | ORAL | Status: DC

## 2013-07-30 NOTE — Progress Notes (Signed)
LMOM TO CB. CLS 

## 2013-07-30 NOTE — Telephone Encounter (Signed)
Wesley Burns, pt's home health nurse, called. She stated that once again his bp was up when she saw him today. She states that is was 182/94 and his pluse rate was 104. She stated that pt is very noncompliant with meds and wound care. She did ask the pt to get his friend to pick up a pill sorter and she would help him get it organized. She also states that the pt had vomited at least once today. Pt didn't mention it but she noticed the trash can and ask him about it and he stated that he had thrown up once that day. You can reach his home health nurse at [phone].

## 2013-07-30 NOTE — Telephone Encounter (Signed)
Detailed message left on her confidential voicemail.  I am aware of his noncompliance, which is why his BP is high.  I asked for her assistance in setting up a pillbox for him, using it to monitor compliance, and for her to write down BP's checked at her visit so that he can bring them to his f/u visit in 2 weeks.

## 2013-08-04 ENCOUNTER — Telehealth: Payer: Self-pay | Admitting: *Deleted

## 2013-08-04 NOTE — Progress Notes (Signed)
Done

## 2013-08-04 NOTE — Telephone Encounter (Signed)
Left message for patient to call me and let me know which [facility] he uses.

## 2013-08-04 NOTE — Progress Notes (Signed)
Faxed to [place] [phone].

## 2013-08-11 ENCOUNTER — Ambulatory Visit (INDEPENDENT_AMBULATORY_CARE_PROVIDER_SITE_OTHER): Payer: 59 | Admitting: Family Medicine

## 2013-08-11 ENCOUNTER — Encounter: Payer: Self-pay | Admitting: Family Medicine

## 2013-08-11 DIAGNOSIS — IMO0001 Reserved for inherently not codable concepts without codable children: Secondary | ICD-10-CM

## 2013-08-11 DIAGNOSIS — Z0000 Encounter for general adult medical examination without abnormal findings: Secondary | ICD-10-CM

## 2013-08-11 DIAGNOSIS — R809 Proteinuria, unspecified: Secondary | ICD-10-CM

## 2013-08-11 DIAGNOSIS — F1010 Alcohol abuse, uncomplicated: Secondary | ICD-10-CM

## 2013-08-11 DIAGNOSIS — I10 Essential (primary) hypertension: Secondary | ICD-10-CM

## 2013-08-11 DIAGNOSIS — E1129 Type 2 diabetes mellitus with other diabetic kidney complication: Secondary | ICD-10-CM

## 2013-08-11 DIAGNOSIS — K529 Noninfective gastroenteritis and colitis, unspecified: Secondary | ICD-10-CM

## 2013-08-11 DIAGNOSIS — Z23 Encounter for immunization: Secondary | ICD-10-CM

## 2013-08-11 DIAGNOSIS — K5289 Other specified noninfective gastroenteritis and colitis: Secondary | ICD-10-CM

## 2013-08-11 LAB — POCT URINALYSIS DIPSTICK
Bilirubin, UA: NEGATIVE
Glucose, UA: NEGATIVE
Leukocytes, UA: NEGATIVE
Nitrite, UA: NEGATIVE
pH, UA: 8

## 2013-08-11 MED ORDER — METFORMIN HCL 1000 MG PO TABS: 1000.0000 mg | ORAL_TABLET | Freq: Two times a day (BID) | ORAL | Status: DC

## 2013-08-11 MED ORDER — LISINOPRIL 40 MG PO TABS: 40.0000 mg | ORAL_TABLET | Freq: Every day | ORAL | Status: DC

## 2013-08-11 NOTE — Patient Instructions (Signed)
Cut metformin dose to 1/2 tablet twice daily with meals just for a week.  Your diarrhea is more likely related to a virus than the medication, but the medication might be making the diarrhea worse.  Continue to check sugars regularly. If sugars are >160 two hours after a meal or >140-150 fasting, you need to increase back up to full tablets twice daily. Otherwise, just plan on being at the 1/2 tablet twice daily dose for a week, and go back up to the full tablet twice daily in a week.  Call us if you have recurrent diarrhea when back on the higher dose. Start probiotic daily (Align)  Your blood pressure is improved.  Continue your current medication.  BRAT diet, advance as tolerated.  Avoid dairy for about a week.  Return for a follow up visit in 3 months--call for a sooner appointment if your blood pressures or blood sugars are running high, or any other concerns arise.  Diet for Diarrhea, Adult Frequent, runny stools (diarrhea) may be caused or worsened by food or drink. Diarrhea may be relieved by changing your diet. Since diarrhea can last up to 7 days, it is easy for you to lose too much fluid from the body and become dehydrated. Fluids that are lost need to be replaced. Along with a modified diet, make sure you drink enough fluids to keep your urine clear or pale yellow. DIET INSTRUCTIONS  Ensure adequate fluid intake (hydration): have 1 cup (8 oz) of fluid for each diarrhea episode. Avoid fluids that contain simple sugars or sports drinks, fruit juices, whole milk products, and sodas. Your urine should be clear or pale yellow if you are drinking enough fluids. Hydrate with an oral rehydration solution that you can purchase at pharmacies, retail stores, and online. You can prepare an oral rehydration solution at home by mixing the following ingredients together:    tsp table salt.   tsp baking soda.   tsp salt substitute containing potassium chloride.  1  tablespoons sugar.  1 L (34 oz)  of water.  Certain foods and beverages may increase the speed at which food moves through the gastrointestinal (GI) tract. These foods and beverages should be avoided and include:  Caffeinated and alcoholic beverages.  High-fiber foods, such as raw fruits and vegetables, nuts, seeds, and whole grain breads and cereals.  Foods and beverages sweetened with sugar alcohols, such as xylitol, sorbitol, and mannitol.  Some foods may be well tolerated and may help thicken stool including:  Starchy foods, such as rice, toast, pasta, low-sugar cereal, oatmeal, grits, baked potatoes, crackers, and bagels.   Bananas.   Applesauce.  Add probiotic-rich foods to help increase healthy bacteria in the GI tract, such as yogurt and fermented milk products. RECOMMENDED FOODS AND BEVERAGES Starches Choose foods with less than 2 g of fiber per serving.  Recommended:  White, [place], and pita breads, plain rolls, buns, bagels. Plain muffins, matzo. Soda, saltine, or graham crackers. Pretzels, melba toast, zwieback. Cooked cereals made with water: cornmeal, farina, cream cereals. Dry cereals: refined corn, wheat, rice. Potatoes prepared any way without skins, refined macaroni, spaghetti, noodles, refined rice.  Avoid:  Bread, rolls, or crackers made with whole wheat, multi-grains, rye, bran seeds, nuts, or coconut. Corn tortillas or taco shells. Cereals containing whole grains, multi-grains, bran, coconut, nuts, raisins. Cooked or dry oatmeal. Coarse wheat cereals, granola. Cereals advertised as "high-fiber." Potato skins. Whole grain pasta, wild or brown rice. Popcorn. Sweet potatoes, yams. Sweet rolls, doughnuts, waffles,  pancakes, sweet breads. Vegetables  Recommended: Strained tomato and vegetable juices. Most well-cooked and canned vegetables without seeds. Fresh: Tender lettuce, cucumber without the skin, cabbage, spinach, bean sprouts.  Avoid: Fresh, cooked, or canned: Artichokes, baked beans, beet  greens, broccoli, Brussels sprouts, corn, kale, legumes, peas, sweet potatoes. Cooked: Green or red cabbage, spinach. Avoid large servings of any vegetables because vegetables shrink when cooked, and they contain more fiber per serving than fresh vegetables. Fruit  Recommended: Cooked or canned: Apricots, applesauce, cantaloupe, cherries, fruit cocktail, grapefruit, grapes, kiwi, mandarin oranges, peaches, pears, plums, watermelon. Fresh: Apples without skin, ripe banana, grapes, cantaloupe, cherries, grapefruit, peaches, oranges, plums. Keep servings limited to  cup or 1 piece.  Avoid: Fresh: Apples with skin, apricots, mangoes, pears, raspberries, strawberries. Prune juice, stewed or dried prunes. Dried fruits, raisins, dates. Large servings of all fresh fruits. Protein  Recommended: Ground or well-cooked tender beef, ham, veal, lamb, pork, or poultry. Eggs. Fish, oysters, shrimp, lobster, other seafoods. Liver, organ meats.  Avoid: Tough, fibrous meats with gristle. Peanut butter, smooth or chunky. Cheese, nuts, seeds, legumes, dried peas, beans, lentils. Dairy  Recommended: Yogurt, lactose-free milk, kefir, drinkable yogurt, buttermilk, soy milk, or plain hard cheese.  Avoid: Milk, chocolate milk, beverages made with milk, such as milkshakes. Soups  Recommended: Bouillon, broth, or soups made from allowed foods. Any strained soup.  Avoid: Soups made from vegetables that are not allowed, cream or milk-based soups. Desserts and Sweets  Recommended: Sugar-free gelatin, sugar-free frozen ice pops made without sugar alcohol.  Avoid: Plain cakes and cookies, pie made with fruit, pudding, custard, cream pie. Gelatin, fruit, ice, sherbet, frozen ice pops. Ice cream, ice milk without nuts. Plain hard candy, honey, jelly, molasses, syrup, sugar, chocolate syrup, gumdrops, marshmallows. Fats and Oils  Recommended: Limit fats to less than 8 tsp per day.  Avoid: Seeds, nuts, olives, avocados.  Margarine, butter, cream, mayonnaise, salad oils, plain salad dressings. Plain gravy, crisp bacon without rind. Beverages  Recommended: Water, decaffeinated teas, oral rehydration solutions, sugar-free beverages not sweetened with sugar alcohols.  Avoid: Fruit juices, caffeinated beverages (coffee, tea, soda), alcohol, sports drinks, or lemon-lime soda. Condiments  Recommended: Ketchup, mustard, horseradish, vinegar, cocoa powder. Spices in moderation: allspice, basil, bay leaves, celery powder or leaves, cinnamon, cumin powder, curry powder, ginger, mace, marjoram, onion or garlic powder, oregano, paprika, parsley flakes, ground pepper, rosemary, sage, savory, tarragon, thyme, turmeric.  Avoid: Coconut, honey. Document Released: 02/08/2004 Document Revised: 08/12/2012 Document Reviewed: 04/03/2012 [facility] Patient Information 2014 [place], [place].
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
## 2013-08-12 ENCOUNTER — Encounter (HOSPITAL_BASED_OUTPATIENT_CLINIC_OR_DEPARTMENT_OTHER): Payer: 59 | Attending: Internal Medicine

## 2013-08-12 DIAGNOSIS — M86679 Other chronic osteomyelitis, unspecified ankle and foot: Secondary | ICD-10-CM | POA: Insufficient documentation

## 2013-08-12 DIAGNOSIS — M9080 Osteopathy in diseases classified elsewhere, unspecified site: Secondary | ICD-10-CM | POA: Insufficient documentation

## 2013-08-12 DIAGNOSIS — L97509 Non-pressure chronic ulcer of other part of unspecified foot with unspecified severity: Secondary | ICD-10-CM | POA: Insufficient documentation

## 2013-08-12 DIAGNOSIS — E1169 Type 2 diabetes mellitus with other specified complication: Secondary | ICD-10-CM | POA: Insufficient documentation

## 2013-08-12 LAB — PTH, INTACT AND CALCIUM: Calcium: 8.2 mg/dL — ABNORMAL LOW (ref 8.4–10.5)

## 2013-08-12 LAB — URINALYSIS, MICROSCOPIC ONLY
Casts: NONE SEEN
Crystals: NONE SEEN

## 2013-08-14 LAB — CULTURE, URINE COMPREHENSIVE

## 2013-08-16 ENCOUNTER — Ambulatory Visit (HOSPITAL_COMMUNITY)
Admission: RE | Admit: 2013-08-16 | Discharge: 2013-08-16 | Disposition: A | Discharge status: Home / Self Care | Payer: 59 | Source: Ambulatory Visit | Attending: Internal Medicine | Admitting: Internal Medicine

## 2013-08-16 ENCOUNTER — Other Ambulatory Visit (HOSPITAL_BASED_OUTPATIENT_CLINIC_OR_DEPARTMENT_OTHER): Payer: Self-pay | Admitting: Internal Medicine

## 2013-08-16 DIAGNOSIS — T148XXA Other injury of unspecified body region, initial encounter: Secondary | ICD-10-CM | POA: Insufficient documentation

## 2013-08-16 DIAGNOSIS — X58XXXA Exposure to other specified factors, initial encounter: Secondary | ICD-10-CM | POA: Insufficient documentation

## 2013-08-24 LAB — GLUCOSE, CAPILLARY
Glucose-Capillary: 103 mg/dL — ABNORMAL HIGH (ref 70–99)
Glucose-Capillary: 141 mg/dL — ABNORMAL HIGH (ref 70–99)

## 2013-08-30 ENCOUNTER — Other Ambulatory Visit: Payer: Self-pay | Admitting: Family Medicine

## 2013-08-30 LAB — GLUCOSE, CAPILLARY: Glucose-Capillary: 109 mg/dL — ABNORMAL HIGH (ref 70–99)

## 2013-08-31 ENCOUNTER — Encounter: Payer: Self-pay | Admitting: Family Medicine

## 2013-08-31 DIAGNOSIS — R809 Proteinuria, unspecified: Secondary | ICD-10-CM | POA: Insufficient documentation

## 2013-08-31 LAB — CREATININE CLEARANCE, URINE, 24 HOUR: Creatinine, 24H Ur: 1262 mg/d (ref 800–2000)

## 2013-08-31 LAB — PROTEIN, URINE, 24 HOUR: Protein, 24H Urine: 2808 mg/d — ABNORMAL HIGH (ref 50–100)

## 2013-09-01 ENCOUNTER — Encounter: Payer: Self-pay | Admitting: Internal Medicine

## 2013-09-01 ENCOUNTER — Encounter (HOSPITAL_BASED_OUTPATIENT_CLINIC_OR_DEPARTMENT_OTHER): Payer: 59 | Attending: Internal Medicine

## 2013-09-01 ENCOUNTER — Other Ambulatory Visit: Payer: Self-pay | Admitting: Internal Medicine

## 2013-09-01 DIAGNOSIS — E1169 Type 2 diabetes mellitus with other specified complication: Secondary | ICD-10-CM | POA: Insufficient documentation

## 2013-09-01 DIAGNOSIS — R809 Proteinuria, unspecified: Secondary | ICD-10-CM

## 2013-09-01 DIAGNOSIS — L97509 Non-pressure chronic ulcer of other part of unspecified foot with unspecified severity: Secondary | ICD-10-CM | POA: Insufficient documentation

## 2013-09-03 LAB — GLUCOSE, CAPILLARY: Glucose-Capillary: 133 mg/dL — ABNORMAL HIGH (ref 70–99)

## 2013-09-06 LAB — GLUCOSE, CAPILLARY: Glucose-Capillary: 119 mg/dL — ABNORMAL HIGH (ref 70–99)

## 2013-09-07 ENCOUNTER — Telehealth: Payer: Self-pay | Admitting: Family Medicine

## 2013-09-07 NOTE — Telephone Encounter (Signed)
Please check on referral.  See if you can get list of BP's from wound clinic (?these aren't in epic?--nothing since visit here 9/10, when BP was lower, and he had been very noncompliant with meds).  I would like to verify compliance with his meds--is he using his pill box, and is he missing any doses? If compliant, and BP's running high, likely will need to add 5mg  amlodipine.  He would need to schedule f/u within 2-3 weeks of starting this med, but this appt could be the one with nephro, if appt falls in that time frame (or sooner).

## 2013-09-08 ENCOUNTER — Telehealth: Payer: Self-pay | Admitting: *Deleted

## 2013-09-08 LAB — GLUCOSE, CAPILLARY
Glucose-Capillary: 109 mg/dL — ABNORMAL HIGH (ref 70–99)
Glucose-Capillary: 139 mg/dL — ABNORMAL HIGH (ref 70–99)
Glucose-Capillary: 112 mg/dL — ABNORMAL HIGH (ref 70–99)

## 2013-09-08 NOTE — Telephone Encounter (Signed)
Have him start amlodipine 5mg  daily.  #30 with 0 refill.  Schedule OV within 2 weeks. Have him bring his bottles and his pill box, along with list of blood pressures (checked at doctors visit, pharmacy, home, where ever they are being checked)

## 2013-09-08 NOTE — Telephone Encounter (Signed)
Called patient and left message on his voicemail to find out if he has been compliant with his medications. His referral was rated a "3" by [facility], this means he will hopefully be scheduled in the next 6-8 weeks. I was able to obtain the records via fax from wound care. His bp numbers are as follows: 09/06/13 169/109 pre-treatment   169/104 post-treatment 09/02/13 168/98 pre-treatment   169/109 post-treatment 08/30/13 190/110 pre-treatment  No reading post as treatment not started due to bp 08/27/13 166/98 pre-treatment   168/88 post-treatment 08/25/13 182/106 pre-treatment   162/102 post-treatment 08/23/13 179/96 pre-treatment   192/100 post-treatment

## 2013-09-10 ENCOUNTER — Telehealth: Payer: Self-pay | Admitting: *Deleted

## 2013-09-10 LAB — GLUCOSE, CAPILLARY: Glucose-Capillary: 113 mg/dL — ABNORMAL HIGH (ref 70–99)

## 2013-09-10 NOTE — Telephone Encounter (Signed)
Left message for pt to return my call.

## 2013-09-13 ENCOUNTER — Other Ambulatory Visit: Payer: Self-pay | Admitting: *Deleted

## 2013-09-13 MED ORDER — AMLODIPINE BESYLATE 5 MG PO TABS: 5.0000 mg | ORAL_TABLET | Freq: Every day | ORAL | Status: DC

## 2013-09-13 NOTE — Telephone Encounter (Signed)
Went over all Dr.Knapp's instructions with patient. Called in rx to [facility] [address]. Scheduled him for 2 week follow up for 09/29/13 and he will bring list of blood pressures and medication bottles and pill box to that appt.

## 2013-09-15 LAB — GLUCOSE, CAPILLARY
Glucose-Capillary: 98 mg/dL (ref 70–99)
Glucose-Capillary: 112 mg/dL — ABNORMAL HIGH (ref 70–99)

## 2013-09-17 ENCOUNTER — Telehealth: Payer: Self-pay | Admitting: Family Medicine

## 2013-09-17 NOTE — Telephone Encounter (Signed)
[facility] with [facility] called with update. Pt can not take Norvasc. She states it makes him throw-up and he has thrown up every day since he went on it. Pt is not taking this medication. She just wanted to inform you.

## 2013-09-17 NOTE — Telephone Encounter (Signed)
Noted. Has upcoming appt, will re-eval BP at that visit.  Needs OV sooner if having headaches, chest pain, dyspnea, or if vomiting persists despite stopping the amlodipine

## 2013-09-20 NOTE — Telephone Encounter (Signed)
Left message word for word on voicemail

## 2013-09-29 ENCOUNTER — Ambulatory Visit: Payer: Self-pay | Admitting: Family Medicine

## 2013-10-07 ENCOUNTER — Encounter: Payer: Self-pay | Admitting: Family Medicine

## 2013-10-07 ENCOUNTER — Ambulatory Visit (INDEPENDENT_AMBULATORY_CARE_PROVIDER_SITE_OTHER): Payer: 59 | Admitting: Family Medicine

## 2013-10-07 VITALS — BP 170/88 | HR 80 | Ht 71.0 in | Wt 156.0 lb

## 2013-10-07 DIAGNOSIS — IMO0001 Reserved for inherently not codable concepts without codable children: Secondary | ICD-10-CM

## 2013-10-07 DIAGNOSIS — E1129 Type 2 diabetes mellitus with other diabetic kidney complication: Secondary | ICD-10-CM

## 2013-10-07 DIAGNOSIS — R809 Proteinuria, unspecified: Secondary | ICD-10-CM

## 2013-10-07 DIAGNOSIS — I10 Essential (primary) hypertension: Secondary | ICD-10-CM
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
## 2013-10-07 NOTE — Patient Instructions (Signed)
  Retry the amlodipine, cutting it in 1/2 (taking just 2.5 mg daily).  Try this for a few days.  Call us Monday (and stop the medication) if it makes you sick again.  Then we will change it to toprol XL 25mg  (the beta blocker we discussed)  We will continue to try to get you in with the kidney doctors regarding the abnormal protein levels in the urine, as this kidney issue might be contributing to the blood pressure problems.  Continue to monitor BP and pulse regularly--call/mail/fax blood pressure and pulse results within 2 weeks of the additional medication (1/2 dose amlodipine or the toprol). Continue the lisinopril 40 mg daily.

## 2013-10-13 ENCOUNTER — Telehealth: Payer: Self-pay | Admitting: *Deleted

## 2013-10-13 NOTE — Telephone Encounter (Signed)
LMOVM for Dr. Hyman Hopes at 1:35

## 2013-10-13 NOTE — Telephone Encounter (Signed)
1:40--spoke with Dr. Hyman Hopes who states that he will look into this and see about getting him an appointment scheduled.  He states the need for good control of diabetes (he has) and of blood pressure.  He finds that labetolol works well in these patients (starting at 100mg  BID).  Please call patient and see how he is tolerating the amlodipine--he was asked at his last visit to restart at just 1/2 tablet daily.  How are BP's, and is he having any recurrence of nausea/vomiting (his reasons for stopping the med at full tablet, 5mg ).  If he isn't tolerating it, then send back to me so we can start labetolol (I had discussed toprol with him, but per Dr. Hyman Hopes, will use labetolol, but I might start slower with him).  Also, Dr. Hyman Hopes should have someone getting in contact with him to schedule appt.

## 2013-10-13 NOTE — Telephone Encounter (Signed)
I called over to [facility], they will probably be scheduling patient for an appt in the next 4 weeks. I asked if you could speak to one of their providers. I was told that they only have one in the building today and they probably would not be able to get him to the phone. Bjorn Loser have me Dr.Webb's cell number, he is the nephro on call today for that office-she said that would be the best thing to do. His cell number is [phone].

## 2013-10-13 NOTE — Telephone Encounter (Signed)
Left message for patient to return my call.

## 2013-10-14 ENCOUNTER — Telehealth: Payer: Self-pay | Admitting: *Deleted

## 2013-10-14 ENCOUNTER — Telehealth: Payer: Self-pay | Admitting: Family Medicine

## 2013-10-14 NOTE — Telephone Encounter (Signed)
Continue at the 1/2 tablet of amlodipine daily, since he is tolerating it, and BP's are improving (so far--will take a little longer to see full effect). Continue to monitor and document the BP's

## 2013-10-14 NOTE — Telephone Encounter (Signed)
Pt called to give BP readings as follows:  9/1   164/84   Prior to meds  9/5   136/68  9/10 142/64  After meds  9/12  178/82 Prior to meds  9/19  168/84 Prior to meds  9/22  170/74 Prior to med  9/29  164/78 After meds  10/2   172/84  10/6   182/92 Prior to meds  10/17 178/74  10/24 158/82  10/27 148/74 After meds  10/31 158/96 Prior to meds  11/7    148/74  11/10  150/84

## 2013-10-14 NOTE — Telephone Encounter (Signed)
Spoke with patient and he is tolerating the 1/2 tab of amlodipine. His bp readings were given to Tammy and sent to you as he and I were playing phone tag.

## 2013-10-14 NOTE — Telephone Encounter (Signed)
Left message on patient's voicemail with Dr.Knapp's recommedations. Told him to call back if he had any questions.

## 2013-10-15 NOTE — Telephone Encounter (Signed)
tsd  °

## 2013-10-21 ENCOUNTER — Telehealth: Payer: Self-pay | Admitting: Internal Medicine

## 2013-10-21 NOTE — Telephone Encounter (Signed)
[facility] called stating that pt has an appt December 3 @ 3:30pm with Dr. Hyman Hopes. When the referral was sent over only 2 labs notes from date 6/6 and 9/29 and 1 Ov from 9/10 was sent over. They are needing the last 2 years of labs and last 8 ov if available. Please fax to [facility] @ [phone]

## 2013-10-21 NOTE — Telephone Encounter (Signed)
Faxed

## 2013-11-11 ENCOUNTER — Encounter: Payer: 59 | Admitting: Family Medicine

## 2013-11-18 ENCOUNTER — Telehealth: Payer: Self-pay | Admitting: *Deleted

## 2013-11-18 NOTE — Telephone Encounter (Signed)
Spoke with patient he does not want to go back to [facility]. He states he will go anywhere else within 100 miles. He is requesting to go to [facility] if possible.

## 2013-11-18 NOTE — Telephone Encounter (Signed)
Ok to refer to nephrology at [facility].  Provide same info

## 2013-11-22 ENCOUNTER — Telehealth: Payer: Self-pay | Admitting: *Deleted

## 2013-11-22 NOTE — Telephone Encounter (Signed)
Called patient to let him know that I faxed referral to [facility]-fax # [phone], they will contact him usually within 48 hours after receiving records and reviewing to schedule appt.

## 2013-12-08 ENCOUNTER — Other Ambulatory Visit: Payer: Self-pay | Admitting: Family Medicine

## 2013-12-09 ENCOUNTER — Ambulatory Visit: Payer: Self-pay | Admitting: Family Medicine

## 2013-12-13 ENCOUNTER — Telehealth: Payer: Self-pay | Admitting: *Deleted

## 2013-12-13 NOTE — Telephone Encounter (Signed)
Called patient to let him know that he is scheduled @ [facility] for Tuesday 12/28/13 @ 9:00am. Their telephone number is [phone]. He is being seen at the main campus @ main hospital 7th floor-[place] [address]

## 2013-12-22 ENCOUNTER — Ambulatory Visit: Payer: Self-pay | Admitting: Family Medicine

## 2013-12-23 ENCOUNTER — Telehealth: Payer: Self-pay | Admitting: *Deleted

## 2013-12-23 NOTE — Telephone Encounter (Signed)
Patient returned my call. I asked him if he got my message asking him to come in either today or tomorrow or go to an urgent care if he cannot come in either of the options. He told me that he really didn't know what to do and that Dr.Knapp had no right to deny him service, and if she is going to tell him when he can come in and when he cannot he will just find a new doctor. He starting talking over me and getting agitated. I tried to explain to him that his sutures have been in for a long time, possibly 4-5 weeks and he screamed at me that it was only 3 and this was ridiculous. I also explained to him that he has scheduled several appointments and canceled same day and this was not appropriate. I asked him again if he wanted to schedule for today or tomorrow and he said no thank you and hung up.

## 2013-12-23 NOTE — Telephone Encounter (Signed)
[facility]--see message

## 2013-12-23 NOTE — Telephone Encounter (Signed)
error 

## 2013-12-24 ENCOUNTER — Encounter (HOSPITAL_COMMUNITY): Payer: Self-pay | Admitting: Emergency Medicine

## 2013-12-24 ENCOUNTER — Emergency Department (HOSPITAL_COMMUNITY): Payer: 59

## 2013-12-24 ENCOUNTER — Observation Stay (HOSPITAL_COMMUNITY): Payer: 59 | Admitting: Anesthesiology

## 2013-12-24 ENCOUNTER — Encounter (HOSPITAL_COMMUNITY): Payer: 59 | Admitting: Anesthesiology

## 2013-12-24 ENCOUNTER — Observation Stay (HOSPITAL_COMMUNITY): Payer: 59

## 2013-12-24 ENCOUNTER — Encounter (HOSPITAL_COMMUNITY)
Admission: EM | Disposition: A | Discharge status: Home / Self Care | Payer: 59 | Source: Home / Self Care | Attending: Emergency Medicine

## 2013-12-24 ENCOUNTER — Observation Stay (HOSPITAL_COMMUNITY)
Admission: EM | Admit: 2013-12-24 | Discharge: 2013-12-25 | Disposition: A | Discharge status: Home / Self Care | Payer: 59 | Attending: Orthopedic Surgery | Admitting: Orthopedic Surgery

## 2013-12-24 DIAGNOSIS — T84029A Dislocation of unspecified internal joint prosthesis, initial encounter: Principal | ICD-10-CM | POA: Insufficient documentation

## 2013-12-24 DIAGNOSIS — X500XXA Overexertion from strenuous movement or load, initial encounter: Secondary | ICD-10-CM | POA: Insufficient documentation

## 2013-12-24 DIAGNOSIS — S98919A Complete traumatic amputation of unspecified foot, level unspecified, initial encounter: Secondary | ICD-10-CM | POA: Insufficient documentation

## 2013-12-24 DIAGNOSIS — L408 Other psoriasis: Secondary | ICD-10-CM | POA: Insufficient documentation

## 2013-12-24 DIAGNOSIS — Z7982 Long term (current) use of aspirin: Secondary | ICD-10-CM | POA: Insufficient documentation

## 2013-12-24 DIAGNOSIS — S73006A Unspecified dislocation of unspecified hip, initial encounter: Secondary | ICD-10-CM

## 2013-12-24 DIAGNOSIS — I10 Essential (primary) hypertension: Secondary | ICD-10-CM | POA: Insufficient documentation

## 2013-12-24 DIAGNOSIS — G5790 Unspecified mononeuropathy of unspecified lower limb: Secondary | ICD-10-CM | POA: Insufficient documentation

## 2013-12-24 DIAGNOSIS — Y92009 Unspecified place in unspecified non-institutional (private) residence as the place of occurrence of the external cause: Secondary | ICD-10-CM | POA: Insufficient documentation

## 2013-12-24 DIAGNOSIS — K219 Gastro-esophageal reflux disease without esophagitis: Secondary | ICD-10-CM | POA: Insufficient documentation

## 2013-12-24 DIAGNOSIS — T84028A Dislocation of other internal joint prosthesis, initial encounter: Secondary | ICD-10-CM

## 2013-12-24 DIAGNOSIS — Z96649 Presence of unspecified artificial hip joint: Secondary | ICD-10-CM | POA: Insufficient documentation

## 2013-12-24 DIAGNOSIS — F17200 Nicotine dependence, unspecified, uncomplicated: Secondary | ICD-10-CM | POA: Insufficient documentation

## 2013-12-24 DIAGNOSIS — E119 Type 2 diabetes mellitus without complications: Secondary | ICD-10-CM | POA: Insufficient documentation

## 2013-12-24 DIAGNOSIS — M109 Gout, unspecified: Secondary | ICD-10-CM | POA: Insufficient documentation

## 2013-12-24 HISTORY — PX: HIP CLOSED REDUCTION: SHX983

## 2013-12-24 LAB — BASIC METABOLIC PANEL
BUN: 3 mg/dL — AB (ref 6–23)
CALCIUM: 7.9 mg/dL — AB (ref 8.4–10.5)
CO2: 24 mEq/L (ref 19–32)
Chloride: 85 mEq/L — ABNORMAL LOW (ref 96–112)
Creatinine, Ser: 0.98 mg/dL (ref 0.50–1.35)
GFR calc Af Amer: >90 mL/min (ref >90)
GLUCOSE: 87 mg/dL (ref 70–99)
Potassium: 2.4 mEq/L — CL (ref 3.7–5.3)
Sodium: 135 mEq/L — ABNORMAL LOW (ref 137–147)

## 2013-12-24 LAB — CBC WITH DIFFERENTIAL/PLATELET
Basophils Absolute: 0.1 10*3/uL (ref 0.0–0.1)
Basophils Relative: 1 % (ref 0–1)
EOS ABS: 0 10*3/uL (ref 0.0–0.7)
EOS PCT: 0 % (ref 0–5)
HCT: 40.8 % (ref 39.0–52.0)
HEMOGLOBIN: 15.1 g/dL (ref 13.0–17.0)
LYMPHS ABS: 1.1 10*3/uL (ref 0.7–4.0)
Lymphocytes Relative: 23 % (ref 12–46)
MCH: 35.3 pg — AB (ref 26.0–34.0)
MCHC: 37 g/dL — ABNORMAL HIGH (ref 30.0–36.0)
MCV: 95.3 fL (ref 78.0–100.0)
MONOS PCT: 14 % — AB (ref 3–12)
Monocytes Absolute: 0.7 10*3/uL (ref 0.1–1.0)
Neutro Abs: 3.1 10*3/uL (ref 1.7–7.7)
Neutrophils Relative %: 62 % (ref 43–77)
Platelets: 294 10*3/uL (ref 150–400)
RBC: 4.28 MIL/uL (ref 4.22–5.81)
RDW: 14.2 % (ref 11.5–15.5)
WBC: 5 10*3/uL (ref 4.0–10.5)

## 2013-12-24 LAB — SEDIMENTATION RATE: Sed Rate: 10 mm/hr (ref 0–16)

## 2013-12-24 LAB — POTASSIUM: Potassium: 2.2 mEq/L — CL (ref 3.7–5.3)

## 2013-12-24 LAB — GLUCOSE, CAPILLARY: Glucose-Capillary: 90 mg/dL (ref 70–99)

## 2013-12-24 SURGERY — CLOSED REDUCTION, HIP
Anesthesia: General | Laterality: Right

## 2013-12-24 MED ORDER — POTASSIUM CHLORIDE 10 MEQ/100ML IV SOLN
10.0000 meq | Freq: Once | INTRAVENOUS | Status: AC
  Administered 2013-12-24: 10 meq via INTRAVENOUS
  Filled 2013-12-24: qty 100

## 2013-12-24 MED ORDER — ONDANSETRON HCL 4 MG/2ML IJ SOLN: 4.0000 mg | Freq: Four times a day (QID) | INTRAMUSCULAR | Status: DC | PRN

## 2013-12-24 MED ORDER — PROMETHAZINE HCL 25 MG/ML IJ SOLN: 6.2500 mg | INTRAMUSCULAR | Status: DC | PRN

## 2013-12-24 MED ORDER — LORAZEPAM 2 MG/ML IJ SOLN
1.0000 mg | Freq: Once | INTRAMUSCULAR | Status: AC
  Administered 2013-12-24: 1 mg via INTRAVENOUS
  Filled 2013-12-24: qty 1

## 2013-12-24 MED ORDER — OXYCODONE HCL 5 MG PO TABS: 5.0000 mg | ORAL_TABLET | ORAL | Status: DC | PRN

## 2013-12-24 MED ORDER — PROPOFOL 10 MG/ML IV BOLUS
INTRAVENOUS | Status: AC
Start: 2013-12-24 — End: 2013-12-24
  Filled 2013-12-24: qty 20

## 2013-12-24 MED ORDER — HYDROMORPHONE HCL PF 1 MG/ML IJ SOLN: 1.0000 mg | INTRAMUSCULAR | Status: DC | PRN

## 2013-12-24 MED ORDER — ASPIRIN EC 81 MG PO TBEC: 81.0000 mg | DELAYED_RELEASE_TABLET | Freq: Every day | ORAL | Status: DC

## 2013-12-24 MED ORDER — LACTATED RINGERS IV SOLN
INTRAVENOUS | Status: DC | PRN
  Administered 2013-12-24: 23:00:00 via INTRAVENOUS

## 2013-12-24 MED ORDER — HYDROMORPHONE HCL PF 1 MG/ML IJ SOLN
0.5000 mg | Freq: Once | INTRAMUSCULAR | Status: AC
  Administered 2013-12-24: 0.5 mg via INTRAVENOUS
  Filled 2013-12-24: qty 1

## 2013-12-24 MED ORDER — ACETAMINOPHEN 325 MG PO TABS: 650.0000 mg | ORAL_TABLET | Freq: Four times a day (QID) | ORAL | Status: DC | PRN

## 2013-12-24 MED ORDER — SODIUM CHLORIDE 0.9 % IJ SOLN: 3.0000 mL | INTRAMUSCULAR | Status: DC | PRN

## 2013-12-24 MED ORDER — ONDANSETRON HCL 4 MG/2ML IJ SOLN
4.0000 mg | Freq: Once | INTRAMUSCULAR | Status: AC
  Administered 2013-12-24: 4 mg via INTRAVENOUS
  Filled 2013-12-24: qty 2

## 2013-12-24 MED ORDER — SODIUM CHLORIDE 0.9 % IV SOLN: 250.0000 mL | INTRAVENOUS | Status: DC | PRN

## 2013-12-24 MED ORDER — ACETAMINOPHEN 650 MG RE SUPP: 650.0000 mg | Freq: Four times a day (QID) | RECTAL | Status: DC | PRN

## 2013-12-24 MED ORDER — LIDOCAINE HCL (CARDIAC) 20 MG/ML IV SOLN
INTRAVENOUS | Status: DC | PRN
  Administered 2013-12-24: 100 mg via INTRAVENOUS

## 2013-12-24 MED ORDER — SODIUM CHLORIDE 0.9 % IJ SOLN: 3.0000 mL | Freq: Two times a day (BID) | INTRAMUSCULAR | Status: DC

## 2013-12-24 MED ORDER — ONDANSETRON HCL 4 MG PO TABS: 4.0000 mg | ORAL_TABLET | Freq: Four times a day (QID) | ORAL | Status: DC | PRN

## 2013-12-24 MED ORDER — LISINOPRIL 20 MG PO TABS
40.0000 mg | ORAL_TABLET | Freq: Once | ORAL | Status: AC
  Administered 2013-12-24: 40 mg via ORAL
  Filled 2013-12-24: qty 2

## 2013-12-24 MED ORDER — POTASSIUM CHLORIDE 20 MEQ/15ML (10%) PO LIQD
40.0000 meq | Freq: Once | ORAL | Status: AC
  Administered 2013-12-25: 40 meq via ORAL
  Filled 2013-12-24: qty 30

## 2013-12-24 MED ORDER — PROPOFOL 10 MG/ML IV BOLUS
INTRAVENOUS | Status: DC | PRN
  Administered 2013-12-24: 300 mg via INTRAVENOUS

## 2013-12-24 MED ORDER — OXYCODONE HCL 5 MG/5ML PO SOLN: 5.0000 mg | Freq: Once | ORAL | Status: AC | PRN

## 2013-12-24 MED ORDER — OXYCODONE HCL 5 MG PO TABS: 5.0000 mg | ORAL_TABLET | Freq: Once | ORAL | Status: AC | PRN

## 2013-12-24 MED ORDER — FENTANYL CITRATE 0.05 MG/ML IJ SOLN
25.0000 ug | INTRAMUSCULAR | Status: DC | PRN
  Administered 2013-12-25: 25 ug via INTRAVENOUS
  Administered 2013-12-25: 50 ug via INTRAVENOUS

## 2013-12-24 MED ORDER — SUCCINYLCHOLINE CHLORIDE 20 MG/ML IJ SOLN
INTRAMUSCULAR | Status: DC | PRN
  Administered 2013-12-24: 60 mg via INTRAVENOUS

## 2013-12-24 MED ORDER — LACTATED RINGERS IV SOLN
INTRAVENOUS | Status: DC
  Administered 2013-12-25: 04:00:00 via INTRAVENOUS

## 2013-12-24 SURGICAL SUPPLY — 2 items
IMMOBILIZER KNEE 22  40 CIR (ORTHOPEDIC SUPPLIES) ×2
IMMOBILIZER KNEE 22 40 CIR (ORTHOPEDIC SUPPLIES) IMPLANT
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
## 2013-12-24 NOTE — ED Notes (Signed)
Pt returned to room, c/o improved but continued pain, also nausea from the transport

## 2013-12-24 NOTE — ED Notes (Signed)
Pt to OR at this time.

## 2013-12-24 NOTE — ED Notes (Signed)
MD notified of patient BP, pt has not taken normal BP medication today, orders received
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
## 2013-12-24 NOTE — ED Notes (Signed)
MD Alinda Deem notified of patient BP

## 2013-12-24 NOTE — ED Notes (Signed)
CBG-90. Notified RN

## 2013-12-24 NOTE — ED Notes (Signed)
Pt family Forest Gleason requesting to be called with updates, [phone]
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
## 2013-12-24 NOTE — ED Notes (Signed)
K of 2.4 per lab

## 2013-12-24 NOTE — ED Notes (Signed)
Ortho MD at bedside.

## 2013-12-24 NOTE — ED Notes (Signed)
Pt in via EMS, pt s/o bilateral hip replacements and hx of right hip dislocation, states this am he rolled over in bed and noted pain to right hip, states he is still able to move it and pain has not been as severe as hip dislocations in the past, took ibuprofen 600mg  PTA, states pain has continued and hip "doesn't feel right", no deformity noted per EMS, rate pain 4/10 upon arrival.

## 2013-12-24 NOTE — Transfer of Care (Signed)
Immediate Anesthesia Transfer of Care Note  Patient: Wesley Burns  Procedure(s) Performed: Procedure(s): CLOSED REDUCTION HIP (Right)  Patient Location: PACU  Anesthesia Type:General  Level of Consciousness: sedated  Airway & Oxygen Therapy: Patient Spontanous Breathing and Patient connected to nasal cannula oxygen  Post-op Assessment: Report given to PACU RN and Post -op Vital signs reviewed and stable  Post vital signs: Reviewed and stable  Complications: No apparent anesthesia complications

## 2013-12-24 NOTE — Brief Op Note (Signed)
12/24/2013  11:28 PM  PATIENT:  Wesley Burns  48 y.o. male  PRE-OPERATIVE DIAGNOSIS:  dislocated hip right  POST-OPERATIVE DIAGNOSIS:  dislocated hip right  PROCEDURE:  Procedure(s): CLOSED REDUCTION HIP (Right)  SURGEON:  Surgeon(s) and Role:    * Jacki Cones, MD - Primary  PHYSICIAN ASSISTANT:Amber Aledo PA   ASSISTANTS: Dimitri Ped PA  ANESTHESIA:   general  EBL:     BLOOD ADMINISTERED:none  DRAINS: none   LOCAL MEDICATIONS USED:  NONE  SPECIMEN:  No Specimen  DISPOSITION OF SPECIMEN:  N/A  COUNTS:  YES  TOURNIQUET:  * No tourniquets in log *  DICTATION: .Other Dictation: Dictation Number [id number]  PLAN OF CARE: Admit for overnight observation  PATIENT DISPOSITION:  PACU - hemodynamically stable.   Delay start of Pharmacological VTE agent (>24hrs) due to surgical blood loss or risk of bleeding: yes

## 2013-12-24 NOTE — Anesthesia Postprocedure Evaluation (Signed)
  Anesthesia Post-op Note  Patient: Wesley Burns  Procedure(s) Performed: Procedure(s): CLOSED REDUCTION HIP (Right)  Patient Location: PACU  Anesthesia Type:General  Level of Consciousness: awake  Airway and Oxygen Therapy: Patient Spontanous Breathing  Post-op Pain: none  Post-op Assessment: Post-op Vital signs reviewed, Patient's Cardiovascular Status Stable, Respiratory Function Stable, Patent Airway, No signs of Nausea or vomiting and Pain level controlled  Post-op Vital Signs: Reviewed and stable  Complications: No apparent anesthesia complications

## 2013-12-24 NOTE — ED Notes (Addendum)
Report called to Melissa in OR, spoke with Melissa about started potassium IV upon arrival to OR, bag sent with patient.

## 2013-12-25 ENCOUNTER — Encounter (HOSPITAL_COMMUNITY): Payer: Self-pay | Admitting: Family

## 2013-12-25 LAB — GLUCOSE, CAPILLARY
GLUCOSE-CAPILLARY: 117 mg/dL — AB (ref 70–99)
Glucose-Capillary: 117 mg/dL — ABNORMAL HIGH (ref 70–99)
Glucose-Capillary: 57 mg/dL — ABNORMAL LOW (ref 70–99)
Glucose-Capillary: 60 mg/dL — ABNORMAL LOW (ref 70–99)
Glucose-Capillary: 80 mg/dL (ref 70–99)

## 2013-12-25 LAB — BASIC METABOLIC PANEL
BUN: 3 mg/dL — AB (ref 6–23)
CHLORIDE: 93 meq/L — AB (ref 96–112)
CO2: 30 meq/L (ref 19–32)
Calcium: 7.2 mg/dL — ABNORMAL LOW (ref 8.4–10.5)
Creatinine, Ser: 0.8 mg/dL (ref 0.50–1.35)
GFR calc Af Amer: >90 mL/min (ref >90)
GFR calc non Af Amer: >90 mL/min (ref >90)
GLUCOSE: 75 mg/dL (ref 70–99)
Potassium: 2.9 mEq/L — CL (ref 3.7–5.3)
Sodium: 136 mEq/L — ABNORMAL LOW (ref 137–147)

## 2013-12-25 LAB — C-REACTIVE PROTEIN: CRP: <0.5 mg/dL — ABNORMAL LOW (ref <0.60)

## 2013-12-25 MED ORDER — ALUM & MAG HYDROXIDE-SIMETH 200-200-20 MG/5ML PO SUSP
30.0000 mL | ORAL | Status: DC | PRN
Start: 2013-12-25 — End: 2013-12-25

## 2013-12-25 MED ORDER — METHOCARBAMOL 500 MG PO TABS: 500.0000 mg | ORAL_TABLET | Freq: Four times a day (QID) | ORAL | Status: DC | PRN

## 2013-12-25 MED ORDER — ONDANSETRON HCL 4 MG/2ML IJ SOLN
4.0000 mg | Freq: Four times a day (QID) | INTRAMUSCULAR | Status: DC | PRN
Start: 2013-12-25 — End: 2013-12-25

## 2013-12-25 MED ORDER — METHOCARBAMOL 100 MG/ML IJ SOLN: 500.0000 mg | Freq: Four times a day (QID) | INTRAMUSCULAR | Status: DC | PRN

## 2013-12-25 MED ORDER — POLYETHYLENE GLYCOL 3350 17 G PO PACK
17.0000 g | PACK | Freq: Every day | ORAL | Status: DC | PRN
Start: 2013-12-25 — End: 2013-12-25

## 2013-12-25 MED ORDER — HYDROMORPHONE HCL PF 1 MG/ML IJ SOLN: 1.0000 mg | INTRAMUSCULAR | Status: DC | PRN

## 2013-12-25 MED ORDER — HYDROCODONE-ACETAMINOPHEN 5-325 MG PO TABS: 1.0000 | ORAL_TABLET | ORAL | Status: DC | PRN

## 2013-12-25 MED ORDER — FENTANYL CITRATE 0.05 MG/ML IJ SOLN
INTRAMUSCULAR | Status: AC
  Filled 2013-12-25: qty 2

## 2013-12-25 MED ORDER — OXYCODONE HCL 5 MG/5ML PO SOLN: 5.0000 mg | Freq: Once | ORAL | Status: DC | PRN

## 2013-12-25 MED ORDER — ASPIRIN 81 MG PO CHEW: 81.0000 mg | CHEWABLE_TABLET | Freq: Every day | ORAL | Status: DC

## 2013-12-25 MED ORDER — INSULIN ASPART 100 UNIT/ML ~~LOC~~ SOLN: 0.0000 [IU] | Freq: Three times a day (TID) | SUBCUTANEOUS | Status: DC

## 2013-12-25 MED ORDER — ONDANSETRON HCL 4 MG PO TABS: 4.0000 mg | ORAL_TABLET | Freq: Four times a day (QID) | ORAL | Status: DC | PRN

## 2013-12-25 MED ORDER — POTASSIUM CHLORIDE CRYS ER 20 MEQ PO TBCR: 20.0000 meq | EXTENDED_RELEASE_TABLET | Freq: Two times a day (BID) | ORAL | Status: DC

## 2013-12-25 MED ORDER — ASPIRIN EC 325 MG PO TBEC
325.0000 mg | DELAYED_RELEASE_TABLET | Freq: Every day | ORAL | Status: DC
  Administered 2013-12-25: 325 mg via ORAL
  Filled 2013-12-25 (×2): qty 1

## 2013-12-25 MED ORDER — POTASSIUM CHLORIDE CRYS ER 20 MEQ PO TBCR
20.0000 meq | EXTENDED_RELEASE_TABLET | Freq: Two times a day (BID) | ORAL | Status: DC
  Administered 2013-12-25: 20 meq via ORAL
  Filled 2013-12-25: qty 1

## 2013-12-25 MED ORDER — HYDROCODONE-ACETAMINOPHEN 5-325 MG PO TABS
1.0000 | ORAL_TABLET | ORAL | Status: DC | PRN
  Administered 2013-12-25: 2 via ORAL
  Filled 2013-12-25: qty 2

## 2013-12-25 MED ORDER — PHENOL 1.4 % MT LIQD
1.0000 | OROMUCOSAL | Status: DC | PRN
Start: 2013-12-25 — End: 2013-12-25

## 2013-12-25 MED ORDER — METOPROLOL TARTRATE 50 MG PO TABS
50.0000 mg | ORAL_TABLET | Freq: Once | ORAL | Status: AC
  Administered 2013-12-25: 50 mg via ORAL
  Filled 2013-12-25: qty 1

## 2013-12-25 MED ORDER — MENTHOL 3 MG MT LOZG: 1.0000 | LOZENGE | OROMUCOSAL | Status: DC | PRN

## 2013-12-25 MED ORDER — ACETAMINOPHEN 325 MG PO TABS: 650.0000 mg | ORAL_TABLET | Freq: Four times a day (QID) | ORAL | Status: DC | PRN

## 2013-12-25 MED ORDER — ACETAMINOPHEN 650 MG RE SUPP: 650.0000 mg | Freq: Four times a day (QID) | RECTAL | Status: DC | PRN

## 2013-12-25 MED ORDER — PROMETHAZINE HCL 25 MG/ML IJ SOLN
6.2500 mg | Freq: Four times a day (QID) | INTRAMUSCULAR | Status: DC | PRN
  Administered 2013-12-25: 6.25 mg via INTRAVENOUS
  Filled 2013-12-25: qty 1

## 2013-12-25 MED ORDER — METFORMIN HCL 500 MG PO TABS
500.0000 mg | ORAL_TABLET | Freq: Two times a day (BID) | ORAL | Status: DC
  Administered 2013-12-25: 500 mg via ORAL
  Filled 2013-12-25 (×3): qty 1

## 2013-12-25 MED ORDER — OXYCODONE HCL 5 MG PO TABS
5.0000 mg | ORAL_TABLET | Freq: Once | ORAL | Status: DC | PRN
Start: 2013-12-25 — End: 2013-12-25

## 2013-12-25 NOTE — Progress Notes (Signed)
Pt is concerned about personal belongings (three rings and one sterling silver necklace).  Patient informed items were placed in safe prior to arrival to floor.  See chart for key to retrieve these items.

## 2013-12-25 NOTE — Discharge Instructions (Signed)
WBAT with walker Follow up in office in 2 weeks to see Dr. Bea Laura PCP ASAP regarding low potassium

## 2013-12-25 NOTE — Evaluation (Signed)
Physical Therapy Evaluation Patient Details Name: Wesley Burns MRN: [id number] DOB: 11-14-1966 Today's Date: 12/25/2013 Time: 6301-6010 PT Time Calculation (min): 22 min  PT Assessment / Plan / Recommendation History of Present Illness  Pt reports dislocating R hip while rolling in bed at home.  Closed reduction  yesterday, 12/24/13.  This is patien'ts 5th hip dislocation.  Clinical Impression  Pt sustained his 5th hip dislocation on R, requiring closed reduction 12/24/13.  He reports having all needed home equipment and verbalizes understanding of hip precautions.  Pt declined stair training, stating he has already been educated and will have needed assist from friends/family to enter home.  Pt declined HHPT services, but states he will request services from MD at f/u appt, if needed.  No further PT needs.  PT sign off.    PT Assessment  Patent does not need any further PT services    Follow Up Recommendations  No PT follow up    Does the patient have the potential to tolerate intense rehabilitation      Barriers to Discharge        Equipment Recommendations  None recommended by PT    Recommendations for Other Services     Frequency      Precautions / Restrictions Precautions Precautions: Posterior Hip Precaution Booklet Issued: Yes (comment) Precaution Comments: Pt educated on 3/3 hip precautions. Required Braces or Orthoses: Knee Immobilizer - Right Knee Immobilizer - Right: On at all times Restrictions RLE Weight Bearing: Partial weight bearing   Pertinent Vitals/Pain 6/10      Mobility  Bed Mobility Overal bed mobility: Modified Independent Transfers Overall transfer level: Needs assistance Equipment used: Rolling walker (2 wheeled) Transfers: Sit to/from Stand Sit to Stand: Min guard General transfer comment: verbal cues for hand placement Ambulation/Gait Ambulation/Gait assistance: Min guard Ambulation Distance (Feet): 30 Feet Assistive device: Rolling  walker (2 wheeled) Gait Pattern/deviations: Step-to pattern;Antalgic Gait velocity interpretation: Below normal speed for age/gender    Exercises     PT Diagnosis:    PT Problem List:   PT Treatment Interventions:       PT Goals(Current goals can be found in the care plan section) Acute Rehab PT Goals Patient Stated Goal: home PT Goal Formulation: No goals set, d/c therapy  Visit Information  Last PT Received On: 12/25/13 Assistance Needed: +1 History of Present Illness: Pt reports dislocating R hip while rolling in bed at home.  Closed reduction  yesterday, 12/24/13.  This is patien'ts 5th hip dislocation.       Prior Functioning  Home Living Family/patient expects to be discharged to:: Private residence Living Arrangements: Non-relatives/Friends Available Help at Discharge: Family;Friend(s);Available 24 hours/day Type of Home: House Home Access: Stairs to enter [employer] of Steps: 2 Entrance Stairs-Rails: None Home Layout: Two level;Able to live on main level with bedroom/bathroom Alternate Level Stairs-Number of Steps: 16 Home Equipment: Walker - 2 wheels;Bedside commode;Shower seat;Wheelchair - manual Prior Function Level of Independence: Needs assistance Communication Communication: No difficulties    Cognition  Cognition Arousal/Alertness: Awake/alert Behavior During Therapy: WFL for tasks assessed/performed Overall Cognitive Status: History of cognitive impairments - at baseline    Extremity/Trunk Assessment     Balance    End of Session PT - End of Session Equipment Utilized During Treatment: Gait belt;Right knee immobilizer Activity Tolerance: Patient tolerated treatment well Patient left: in bed;with call bell/phone within reach Nurse Communication: Mobility status  GP Functional Assessment Tool Used: clinical judgement Functional Limitation: Mobility: Walking and moving around  Mobility: Walking and Moving Around Current Status [phone]): At  least 1 percent but less than 20 percent impaired, limited or restricted Mobility: Walking and Moving Around Goal Status [phone]): At least 1 percent but less than 20 percent impaired, limited or restricted Mobility: Walking and Moving Around Discharge Status [phone]): At least 1 percent but less than 20 percent impaired, limited or restricted   Ilda Foil 12/25/2013, 2:08 PM  Aida Raider, PT  Office # [phone] Pager [phone]

## 2013-12-25 NOTE — Op Note (Signed)
Wesley Burns, Wesley Burns              ACCOUNT NO.:  [id number]  MEDICAL RECORD NO.:  [id number]  LOCATION:  [id number]                        FACILITY:  [facility]  PHYSICIAN:  Georges Lynch. Carianne Taira, M.D.DATE OF BIRTH:  May 27, 1966  DATE OF PROCEDURE:  12/24/2013 DATE OF DISCHARGE:                              OPERATIVE REPORT   PREOPERATIVE DIAGNOSES: 1. Chronic dislocating right total hip. 2. Diabetic. 3. Hypertension.  POSTOPERATIVE DIAGNOSES: 1. Chronic dislocating right total hip. 2. Diabetic. 3. Hypertension.  OPERATION: 1. Closed manipulation of a dislocated right total hip, which was     extremely complex and it was difficult.  SURGEON:  Georges Lynch. Darrelyn Hillock, M.D.  ASSISTANT:  Dimitri Ped, [place]  PROCEDURE IN DETAIL:  Under general anesthesia, routine time-out was carried out first in the operating room.  I marked the right leg in the holding area.  We also did a time-out for a suture removal of his sutures to the left floor he had put that down in previous laceration sutured left supraorbital region.  Postop diagnosis is same.  The operation was, remove his sutures from the supraorbital region on the left.  Procedure, as I mentioned, under general anesthesia, we first did the appropriate time-out.  I also marked the appropriate stop structures in the holding area.  At this time, I then went through under general anesthesia with maximum relaxation, multiple attempts at a closed reduction.  Note, this was extremely difficult to reduce his hip.  He has had his hip been __________ since this morning, but I doubt that. It must have been out longer than that, but anyway we finally reduced the hip.  X-rays in the OR showed the hip was in anatomical position. We placed him in a knee immobilizer.  In regard to the forehead laceration site, we did appropriate time-out by mentioned and removed the 3 sutures and dressed with a Band-Aid.  The patient will be admitted overnight and followed by  me.          ______________________________ Georges Lynch. Darrelyn Hillock, M.D.    RAG/MEDQ  D:  12/24/2013  T:  12/25/2013  Job:  [id number]

## 2013-12-25 NOTE — Progress Notes (Signed)
Pt CBG=60.  Patient asymptomatic.  Hypoglycemia protocol followed.  Second CBG =117.  Will continue to monitor.

## 2013-12-25 NOTE — Progress Notes (Signed)
Subjective: 1 Day Post-Op Procedure(s) (LRB): CLOSED REDUCTION HIP (Right) Patient reports pain as mild.    Objective: Vital signs in last 24 hours: Temp:  [97.8 F (36.6 C)-99.7 F (37.6 C)] 97.8 F (36.6 C) (01/24 0700) Pulse Rate:  [65-104] 65 (01/24 0700) Resp:  [11-25] 18 (01/24 0700) BP: (131-213)/(82-119) 175/88 mmHg (01/24 0700) SpO2:  [98 %-100 %] 98 % (01/24 0700)  Intake/Output from previous day: 01/23 0701 - 01/24 0700 In: 840 [P.O.:240; I.V.:600] Out: -  Intake/Output this shift:     Recent Labs  12/24/13 1912  HGB 15.1    Recent Labs  12/24/13 1912  WBC 5.0  RBC 4.28  HCT 40.8  PLT 294    Recent Labs  12/24/13 1912 12/24/13 2017 12/25/13 0530  NA 135*  --  136*  K 2.4* 2.2* 2.9*  CL 85*  --  93*  CO2 24  --  30  BUN 3*  --  3*  CREATININE 0.98  --  0.80  GLUCOSE 87  --  75  CALCIUM 7.9*  --  7.2*   No results found for this basename: LABPT, INR,  in the last 72 hours  Neurologically intact Neurovascular intact Leg lengths equal. No rotational deformity  Assessment/Plan: 1 Day Post-Op Procedure(s) (LRB): CLOSED REDUCTION HIP (Right) Discharge home  Wesley Burns V 12/25/2013, 7:27 AM

## 2013-12-25 NOTE — Progress Notes (Signed)
CRITICAL VALUE ALERT  Critical value received:  Potassium = 2.9  Date of notification:  12/25/13  Time of notification:  0650  Critical value read back:yes  Nurse who received alert:  Melvenia Needles, RN  MD notified (1st page):  Dimitri Ped, [place]  Time of first page:  [phone]  MD notified (2nd page):  Time of second page:  Responding MD:  Dimitri Ped, PA  Time MD responded:  [phone]

## 2013-12-25 NOTE — Progress Notes (Signed)
Patient discharge home and discharge instructions reviewed with patient. Patient understands instructions. Personal belongings returned to patient. Will follow up as instructed.

## 2013-12-26 NOTE — ED Provider Notes (Signed)
Medical screening examination/treatment/procedure(s) were conducted as a shared visit with resident-physician practitioner(s) and myself.  I personally evaluated the patient during the encounter.  Pt is a 48 y.o. male with pmhx of BL hip replacement presenting with R hip pain when he awoke this morning.  Pt found to have R prosthetic dislocation.  Unknown time of dislocation. No underlying fracture.  Pt tachycardic, vomiting in ED.  Visitor has admitted to staff pt is heavy alcoholic.  Given his withdraw symptoms, ED sedation not appropriate.  Ortho will take pt or OR for sedation.    Shanna Cisco, MD 12/26/13 [phone]

## 2013-12-27 ENCOUNTER — Telehealth: Payer: Self-pay | Admitting: Family Medicine

## 2013-12-27 ENCOUNTER — Encounter: Payer: Self-pay | Admitting: Family Medicine

## 2013-12-27 LAB — GLUCOSE, CAPILLARY: GLUCOSE-CAPILLARY: 101 mg/dL — AB (ref 70–99)

## 2013-12-27 NOTE — Telephone Encounter (Signed)
Called pt reached his voice mail and advised that I had cancelled the upcoming appointments that we had for him and wanted to let him know to cut down on confusion.  A dismissal letter being sent out today.

## 2013-12-27 NOTE — Telephone Encounter (Signed)
Noted. (FYI--pt was just in hospital for recurrent hip dislocation, but per chart, looks like he was discharged home on 1/24)

## 2013-12-28 ENCOUNTER — Encounter (HOSPITAL_COMMUNITY): Payer: Self-pay | Admitting: Orthopedic Surgery

## 2013-12-28 LAB — POCT I-STAT 4, (NA,K, GLUC, HGB,HCT)
Glucose, Bld: 72 mg/dL (ref 70–99)
HEMATOCRIT: 42 % (ref 39.0–52.0)
Hemoglobin: 14.3 g/dL (ref 13.0–17.0)
Potassium: 2.3 mEq/L — CL (ref 3.7–5.3)
SODIUM: 134 meq/L — AB (ref 137–147)
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
## 2013-12-29 ENCOUNTER — Ambulatory Visit: Payer: Self-pay | Admitting: Family Medicine

## 2014-01-06 ENCOUNTER — Encounter: Payer: 59 | Admitting: Family Medicine

## 2015-04-20 ENCOUNTER — Encounter (HOSPITAL_COMMUNITY): Payer: Self-pay | Admitting: Adult Health

## 2015-04-20 ENCOUNTER — Emergency Department (HOSPITAL_COMMUNITY): Payer: 59

## 2015-04-20 ENCOUNTER — Inpatient Hospital Stay (HOSPITAL_COMMUNITY)
Admission: EM | Admit: 2015-04-20 | Discharge: 2015-05-03 | DRG: 433 | Disposition: A | Discharge status: Skilled Nursing Facility | Payer: 59 | Attending: Internal Medicine | Admitting: Internal Medicine

## 2015-04-20 DIAGNOSIS — Z833 Family history of diabetes mellitus: Secondary | ICD-10-CM

## 2015-04-20 DIAGNOSIS — Z23 Encounter for immunization: Secondary | ICD-10-CM

## 2015-04-20 DIAGNOSIS — E778 Other disorders of glycoprotein metabolism: Secondary | ICD-10-CM | POA: Diagnosis present

## 2015-04-20 DIAGNOSIS — R21 Rash and other nonspecific skin eruption: Secondary | ICD-10-CM | POA: Diagnosis present

## 2015-04-20 DIAGNOSIS — R809 Proteinuria, unspecified: Secondary | ICD-10-CM | POA: Diagnosis present

## 2015-04-20 DIAGNOSIS — E8809 Other disorders of plasma-protein metabolism, not elsewhere classified: Secondary | ICD-10-CM | POA: Diagnosis present

## 2015-04-20 DIAGNOSIS — K7031 Alcoholic cirrhosis of liver with ascites: Principal | ICD-10-CM | POA: Diagnosis present

## 2015-04-20 DIAGNOSIS — S73004D Unspecified dislocation of right hip, subsequent encounter: Secondary | ICD-10-CM

## 2015-04-20 DIAGNOSIS — Z888 Allergy status to other drugs, medicaments and biological substances status: Secondary | ICD-10-CM

## 2015-04-20 DIAGNOSIS — N186 End stage renal disease: Secondary | ICD-10-CM | POA: Diagnosis present

## 2015-04-20 DIAGNOSIS — R609 Edema, unspecified: Secondary | ICD-10-CM

## 2015-04-20 DIAGNOSIS — E871 Hypo-osmolality and hyponatremia: Secondary | ICD-10-CM | POA: Diagnosis present

## 2015-04-20 DIAGNOSIS — G629 Polyneuropathy, unspecified: Secondary | ICD-10-CM | POA: Diagnosis present

## 2015-04-20 DIAGNOSIS — R635 Abnormal weight gain: Secondary | ICD-10-CM | POA: Diagnosis present

## 2015-04-20 DIAGNOSIS — R319 Hematuria, unspecified: Secondary | ICD-10-CM | POA: Diagnosis present

## 2015-04-20 DIAGNOSIS — N184 Chronic kidney disease, stage 4 (severe): Secondary | ICD-10-CM | POA: Diagnosis present

## 2015-04-20 DIAGNOSIS — E1122 Type 2 diabetes mellitus with diabetic chronic kidney disease: Secondary | ICD-10-CM | POA: Diagnosis present

## 2015-04-20 DIAGNOSIS — E861 Hypovolemia: Secondary | ICD-10-CM | POA: Diagnosis present

## 2015-04-20 DIAGNOSIS — Z992 Dependence on renal dialysis: Secondary | ICD-10-CM

## 2015-04-20 DIAGNOSIS — F329 Major depressive disorder, single episode, unspecified: Secondary | ICD-10-CM | POA: Diagnosis present

## 2015-04-20 DIAGNOSIS — F1020 Alcohol dependence, uncomplicated: Secondary | ICD-10-CM | POA: Diagnosis present

## 2015-04-20 DIAGNOSIS — R601 Generalized edema: Secondary | ICD-10-CM | POA: Diagnosis present

## 2015-04-20 DIAGNOSIS — T501X5A Adverse effect of loop [high-ceiling] diuretics, initial encounter: Secondary | ICD-10-CM | POA: Diagnosis not present

## 2015-04-20 DIAGNOSIS — E8770 Fluid overload, unspecified: Secondary | ICD-10-CM | POA: Diagnosis present

## 2015-04-20 DIAGNOSIS — Z89431 Acquired absence of right foot: Secondary | ICD-10-CM

## 2015-04-20 DIAGNOSIS — R0602 Shortness of breath: Secondary | ICD-10-CM | POA: Diagnosis not present

## 2015-04-20 DIAGNOSIS — R64 Cachexia: Secondary | ICD-10-CM | POA: Diagnosis present

## 2015-04-20 DIAGNOSIS — Z96643 Presence of artificial hip joint, bilateral: Secondary | ICD-10-CM | POA: Diagnosis present

## 2015-04-20 DIAGNOSIS — J90 Pleural effusion, not elsewhere classified: Secondary | ICD-10-CM | POA: Diagnosis present

## 2015-04-20 DIAGNOSIS — F419 Anxiety disorder, unspecified: Secondary | ICD-10-CM | POA: Diagnosis not present

## 2015-04-20 DIAGNOSIS — N492 Inflammatory disorders of scrotum: Secondary | ICD-10-CM | POA: Diagnosis present

## 2015-04-20 DIAGNOSIS — Z8249 Family history of ischemic heart disease and other diseases of the circulatory system: Secondary | ICD-10-CM

## 2015-04-20 DIAGNOSIS — I10 Essential (primary) hypertension: Secondary | ICD-10-CM | POA: Diagnosis present

## 2015-04-20 DIAGNOSIS — N179 Acute kidney failure, unspecified: Secondary | ICD-10-CM | POA: Diagnosis not present

## 2015-04-20 DIAGNOSIS — F1010 Alcohol abuse, uncomplicated: Secondary | ICD-10-CM | POA: Diagnosis present

## 2015-04-20 DIAGNOSIS — I129 Hypertensive chronic kidney disease with stage 1 through stage 4 chronic kidney disease, or unspecified chronic kidney disease: Secondary | ICD-10-CM | POA: Diagnosis present

## 2015-04-20 DIAGNOSIS — D649 Anemia, unspecified: Secondary | ICD-10-CM | POA: Diagnosis present

## 2015-04-20 DIAGNOSIS — E876 Hypokalemia: Secondary | ICD-10-CM | POA: Diagnosis present

## 2015-04-20 DIAGNOSIS — K219 Gastro-esophageal reflux disease without esophagitis: Secondary | ICD-10-CM | POA: Diagnosis present

## 2015-04-20 DIAGNOSIS — I739 Peripheral vascular disease, unspecified: Secondary | ICD-10-CM | POA: Diagnosis present

## 2015-04-20 DIAGNOSIS — F17210 Nicotine dependence, cigarettes, uncomplicated: Secondary | ICD-10-CM | POA: Diagnosis present

## 2015-04-20 DIAGNOSIS — M109 Gout, unspecified: Secondary | ICD-10-CM | POA: Diagnosis present

## 2015-04-20 DIAGNOSIS — Z886 Allergy status to analgesic agent status: Secondary | ICD-10-CM

## 2015-04-20 HISTORY — DX: Type 2 diabetes mellitus without complications: E11.9

## 2015-04-20 HISTORY — DX: Anemia, unspecified: D64.9

## 2015-04-20 LAB — COMPREHENSIVE METABOLIC PANEL
ALBUMIN: 1.4 g/dL — AB (ref 3.5–5.0)
ALK PHOS: 157 U/L — AB (ref 38–126)
ALT: 14 U/L — ABNORMAL LOW (ref 17–63)
ANION GAP: 9 (ref 5–15)
AST: 41 U/L (ref 15–41)
BUN: 9 mg/dL (ref 6–20)
CO2: 28 mmol/L (ref 22–32)
CREATININE: 1.18 mg/dL (ref 0.61–1.24)
Calcium: 8.5 mg/dL — ABNORMAL LOW (ref 8.9–10.3)
Chloride: 87 mmol/L — ABNORMAL LOW (ref 101–111)
GFR calc Af Amer: >60 mL/min (ref >60)
GFR calc non Af Amer: >60 mL/min (ref >60)
Glucose, Bld: 93 mg/dL (ref 65–99)
POTASSIUM: 2.7 mmol/L — AB (ref 3.5–5.1)
Sodium: 124 mmol/L — ABNORMAL LOW (ref 135–145)
Total Bilirubin: 0.6 mg/dL (ref 0.3–1.2)
Total Protein: 4.8 g/dL — ABNORMAL LOW (ref 6.5–8.1)

## 2015-04-20 LAB — CBC
HEMATOCRIT: 34.7 % — AB (ref 39.0–52.0)
Hemoglobin: 12.8 g/dL — ABNORMAL LOW (ref 13.0–17.0)
MCH: 34 pg (ref 26.0–34.0)
MCHC: 36.9 g/dL — AB (ref 30.0–36.0)
MCV: 92.3 fL (ref 78.0–100.0)
Platelets: 353 10*3/uL (ref 150–400)
RBC: 3.76 MIL/uL — AB (ref 4.22–5.81)
RDW: 13 % (ref 11.5–15.5)
WBC: 5.6 10*3/uL (ref 4.0–10.5)

## 2015-04-20 LAB — BRAIN NATRIURETIC PEPTIDE: B Natriuretic Peptide: 178.3 pg/mL — ABNORMAL HIGH (ref 0.0–100.0)

## 2015-04-20 LAB — I-STAT TROPONIN, ED: TROPONIN I, POC: 0 ng/mL (ref 0.00–0.08)

## 2015-04-20 LAB — CBG MONITORING, ED: Glucose-Capillary: 77 mg/dL (ref 65–99)

## 2015-04-20 MED ORDER — POTASSIUM CHLORIDE 10 MEQ/100ML IV SOLN
10.0000 meq | INTRAVENOUS | Status: AC
  Administered 2015-04-20 – 2015-04-21 (×4): 10 meq via INTRAVENOUS
  Filled 2015-04-20 (×4): qty 100

## 2015-04-20 MED ORDER — FUROSEMIDE 10 MG/ML IJ SOLN
40.0000 mg | INTRAMUSCULAR | Status: AC
  Administered 2015-04-20: 40 mg via INTRAVENOUS
  Filled 2015-04-20: qty 4

## 2015-04-20 MED ORDER — POTASSIUM CHLORIDE CRYS ER 20 MEQ PO TBCR
40.0000 meq | EXTENDED_RELEASE_TABLET | Freq: Once | ORAL | Status: AC
  Administered 2015-04-20: 40 meq via ORAL
  Filled 2015-04-20: qty 2

## 2015-04-20 NOTE — ED Notes (Signed)
Patient to ED with C/O 30 pound weight gain in 3 weeks.  

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
## 2015-04-20 NOTE — ED Notes (Addendum)
Presents with one month of abdominal, bilateral leg and pedal edema associated with SOB, excessive fatigue and diarrhea. It all began with a rash on his legs one month ago-the rash has gone away-but the swelling has gotten worse-he has +3 pitting edema-unable to walk anymore without assistance which is new. mbr has good color, is alert and oriented. Breath sounds are clear.  20bs gained in one month

## 2015-04-21 ENCOUNTER — Inpatient Hospital Stay (HOSPITAL_COMMUNITY): Payer: 59

## 2015-04-21 ENCOUNTER — Encounter (HOSPITAL_COMMUNITY): Payer: Self-pay | Admitting: General Practice

## 2015-04-21 DIAGNOSIS — F329 Major depressive disorder, single episode, unspecified: Secondary | ICD-10-CM | POA: Diagnosis present

## 2015-04-21 DIAGNOSIS — S73004D Unspecified dislocation of right hip, subsequent encounter: Secondary | ICD-10-CM | POA: Diagnosis not present

## 2015-04-21 DIAGNOSIS — K7031 Alcoholic cirrhosis of liver with ascites: Secondary | ICD-10-CM | POA: Diagnosis present

## 2015-04-21 DIAGNOSIS — E876 Hypokalemia: Secondary | ICD-10-CM

## 2015-04-21 DIAGNOSIS — F1010 Alcohol abuse, uncomplicated: Secondary | ICD-10-CM | POA: Diagnosis not present

## 2015-04-21 DIAGNOSIS — R21 Rash and other nonspecific skin eruption: Secondary | ICD-10-CM | POA: Diagnosis present

## 2015-04-21 DIAGNOSIS — R0600 Dyspnea, unspecified: Secondary | ICD-10-CM

## 2015-04-21 DIAGNOSIS — R319 Hematuria, unspecified: Secondary | ICD-10-CM | POA: Diagnosis present

## 2015-04-21 DIAGNOSIS — G629 Polyneuropathy, unspecified: Secondary | ICD-10-CM | POA: Diagnosis present

## 2015-04-21 DIAGNOSIS — E778 Other disorders of glycoprotein metabolism: Secondary | ICD-10-CM | POA: Diagnosis present

## 2015-04-21 DIAGNOSIS — F17210 Nicotine dependence, cigarettes, uncomplicated: Secondary | ICD-10-CM | POA: Diagnosis present

## 2015-04-21 DIAGNOSIS — I10 Essential (primary) hypertension: Secondary | ICD-10-CM | POA: Diagnosis not present

## 2015-04-21 DIAGNOSIS — E1122 Type 2 diabetes mellitus with diabetic chronic kidney disease: Secondary | ICD-10-CM | POA: Diagnosis present

## 2015-04-21 DIAGNOSIS — E870 Hyperosmolality and hypernatremia: Secondary | ICD-10-CM | POA: Diagnosis not present

## 2015-04-21 DIAGNOSIS — Z886 Allergy status to analgesic agent status: Secondary | ICD-10-CM | POA: Diagnosis not present

## 2015-04-21 DIAGNOSIS — N184 Chronic kidney disease, stage 4 (severe): Secondary | ICD-10-CM | POA: Diagnosis present

## 2015-04-21 DIAGNOSIS — Z87448 Personal history of other diseases of urinary system: Secondary | ICD-10-CM

## 2015-04-21 DIAGNOSIS — N179 Acute kidney failure, unspecified: Secondary | ICD-10-CM | POA: Diagnosis not present

## 2015-04-21 DIAGNOSIS — T501X5A Adverse effect of loop [high-ceiling] diuretics, initial encounter: Secondary | ICD-10-CM | POA: Diagnosis not present

## 2015-04-21 DIAGNOSIS — Z8249 Family history of ischemic heart disease and other diseases of the circulatory system: Secondary | ICD-10-CM | POA: Diagnosis not present

## 2015-04-21 DIAGNOSIS — N492 Inflammatory disorders of scrotum: Secondary | ICD-10-CM | POA: Diagnosis present

## 2015-04-21 DIAGNOSIS — E871 Hypo-osmolality and hyponatremia: Secondary | ICD-10-CM

## 2015-04-21 DIAGNOSIS — R635 Abnormal weight gain: Secondary | ICD-10-CM | POA: Diagnosis present

## 2015-04-21 DIAGNOSIS — F1020 Alcohol dependence, uncomplicated: Secondary | ICD-10-CM | POA: Diagnosis present

## 2015-04-21 DIAGNOSIS — R601 Generalized edema: Secondary | ICD-10-CM | POA: Diagnosis present

## 2015-04-21 DIAGNOSIS — R809 Proteinuria, unspecified: Secondary | ICD-10-CM | POA: Diagnosis present

## 2015-04-21 DIAGNOSIS — R0602 Shortness of breath: Secondary | ICD-10-CM | POA: Diagnosis present

## 2015-04-21 DIAGNOSIS — F419 Anxiety disorder, unspecified: Secondary | ICD-10-CM | POA: Diagnosis not present

## 2015-04-21 DIAGNOSIS — J90 Pleural effusion, not elsewhere classified: Secondary | ICD-10-CM | POA: Diagnosis present

## 2015-04-21 DIAGNOSIS — M109 Gout, unspecified: Secondary | ICD-10-CM | POA: Diagnosis present

## 2015-04-21 DIAGNOSIS — Z833 Family history of diabetes mellitus: Secondary | ICD-10-CM | POA: Diagnosis not present

## 2015-04-21 DIAGNOSIS — D649 Anemia, unspecified: Secondary | ICD-10-CM | POA: Diagnosis present

## 2015-04-21 DIAGNOSIS — E8770 Fluid overload, unspecified: Secondary | ICD-10-CM | POA: Diagnosis present

## 2015-04-21 DIAGNOSIS — Z96643 Presence of artificial hip joint, bilateral: Secondary | ICD-10-CM | POA: Diagnosis present

## 2015-04-21 DIAGNOSIS — Z888 Allergy status to other drugs, medicaments and biological substances status: Secondary | ICD-10-CM | POA: Diagnosis not present

## 2015-04-21 DIAGNOSIS — E8809 Other disorders of plasma-protein metabolism, not elsewhere classified: Secondary | ICD-10-CM | POA: Diagnosis present

## 2015-04-21 DIAGNOSIS — E119 Type 2 diabetes mellitus without complications: Secondary | ICD-10-CM

## 2015-04-21 DIAGNOSIS — Z89431 Acquired absence of right foot: Secondary | ICD-10-CM | POA: Diagnosis not present

## 2015-04-21 DIAGNOSIS — I129 Hypertensive chronic kidney disease with stage 1 through stage 4 chronic kidney disease, or unspecified chronic kidney disease: Secondary | ICD-10-CM | POA: Diagnosis present

## 2015-04-21 DIAGNOSIS — Z23 Encounter for immunization: Secondary | ICD-10-CM | POA: Diagnosis not present

## 2015-04-21 DIAGNOSIS — R64 Cachexia: Secondary | ICD-10-CM | POA: Diagnosis present

## 2015-04-21 DIAGNOSIS — I739 Peripheral vascular disease, unspecified: Secondary | ICD-10-CM | POA: Diagnosis present

## 2015-04-21 DIAGNOSIS — K219 Gastro-esophageal reflux disease without esophagitis: Secondary | ICD-10-CM | POA: Diagnosis present

## 2015-04-21 DIAGNOSIS — E861 Hypovolemia: Secondary | ICD-10-CM | POA: Diagnosis present

## 2015-04-21 LAB — BASIC METABOLIC PANEL
Anion gap: 8 (ref 5–15)
BUN: 9 mg/dL (ref 6–20)
CO2: 28 mmol/L (ref 22–32)
Calcium: 8.1 mg/dL — ABNORMAL LOW (ref 8.9–10.3)
Chloride: 88 mmol/L — ABNORMAL LOW (ref 101–111)
Creatinine, Ser: 1.2 mg/dL (ref 0.61–1.24)
GFR calc non Af Amer: >60 mL/min (ref >60)
GLUCOSE: 72 mg/dL (ref 65–99)
Potassium: 2.9 mmol/L — ABNORMAL LOW (ref 3.5–5.1)
SODIUM: 124 mmol/L — AB (ref 135–145)

## 2015-04-21 LAB — URINALYSIS, ROUTINE W REFLEX MICROSCOPIC
Bilirubin Urine: NEGATIVE
GLUCOSE, UA: NEGATIVE mg/dL
KETONES UR: NEGATIVE mg/dL
Leukocytes, UA: NEGATIVE
Nitrite: NEGATIVE
Protein, ur: 100 mg/dL — AB
Specific Gravity, Urine: 1.006 (ref 1.005–1.030)
Urobilinogen, UA: 0.2 mg/dL (ref 0.0–1.0)
pH: 6.5 (ref 5.0–8.0)

## 2015-04-21 LAB — CBC
HEMATOCRIT: 30.9 % — AB (ref 39.0–52.0)
HEMOGLOBIN: 11.4 g/dL — AB (ref 13.0–17.0)
MCH: 34.2 pg — ABNORMAL HIGH (ref 26.0–34.0)
MCHC: 36.9 g/dL — ABNORMAL HIGH (ref 30.0–36.0)
MCV: 92.8 fL (ref 78.0–100.0)
Platelets: 327 10*3/uL (ref 150–400)
RBC: 3.33 MIL/uL — AB (ref 4.22–5.81)
RDW: 13 % (ref 11.5–15.5)
WBC: 7 10*3/uL (ref 4.0–10.5)

## 2015-04-21 LAB — HIV ANTIBODY (ROUTINE TESTING W REFLEX): HIV Screen 4th Generation wRfx: NONREACTIVE

## 2015-04-21 LAB — URINE MICROSCOPIC-ADD ON

## 2015-04-21 LAB — PROTEIN, URINE, 24 HOUR: Protein, Urine: 153 mg/dL

## 2015-04-21 LAB — CK: Total CK: 41 U/L — ABNORMAL LOW (ref 49–397)

## 2015-04-21 LAB — IRON AND TIBC
IRON: 105 ug/dL (ref 45–182)
Saturation Ratios: 58 % — ABNORMAL HIGH (ref 17.9–39.5)
TIBC: 182 ug/dL — ABNORMAL LOW (ref 250–450)
UIBC: 77 ug/dL

## 2015-04-21 LAB — LIPID PANEL
CHOL/HDL RATIO: 1.8 ratio
CHOLESTEROL: 142 mg/dL (ref 0–200)
HDL: 78 mg/dL (ref >40)
LDL CALC: 48 mg/dL (ref 0–99)
TRIGLYCERIDES: 81 mg/dL (ref <150)
VLDL: 16 mg/dL (ref 0–40)

## 2015-04-21 LAB — OSMOLALITY: OSMOLALITY: 263 mosm/kg — AB (ref 275–300)

## 2015-04-21 LAB — HEPATITIS B SURFACE ANTIGEN: HEP B S AG: NEGATIVE

## 2015-04-21 LAB — URIC ACID: Uric Acid, Serum: 9.1 mg/dL — ABNORMAL HIGH (ref 4.4–7.6)

## 2015-04-21 LAB — GLUCOSE, CAPILLARY: Glucose-Capillary: 80 mg/dL (ref 65–99)

## 2015-04-21 LAB — HEPATITIS B SURFACE ANTIBODY,QUALITATIVE: HEP B S AB: NEGATIVE

## 2015-04-21 MED ORDER — DIPHENHYDRAMINE-ZINC ACETATE 2-0.1 % EX CREA
TOPICAL_CREAM | Freq: Two times a day (BID) | CUTANEOUS | Status: DC | PRN
  Administered 2015-04-21: via TOPICAL
  Filled 2015-04-21 (×2): qty 28

## 2015-04-21 MED ORDER — ONDANSETRON HCL 4 MG PO TABS: 4.0000 mg | ORAL_TABLET | Freq: Four times a day (QID) | ORAL | Status: DC | PRN

## 2015-04-21 MED ORDER — CALCIUM CARBONATE ANTACID 500 MG PO CHEW
400.0000 mg | CHEWABLE_TABLET | Freq: Three times a day (TID) | ORAL | Status: DC | PRN
  Administered 2015-04-21 (×2): 400 mg via ORAL
  Filled 2015-04-21 (×5): qty 2

## 2015-04-21 MED ORDER — ADULT MULTIVITAMIN W/MINERALS CH
1.0000 | ORAL_TABLET | Freq: Every day | ORAL | Status: DC
  Administered 2015-04-21 – 2015-05-03 (×13): 1 via ORAL
  Filled 2015-04-21 (×14): qty 1

## 2015-04-21 MED ORDER — LORAZEPAM 2 MG/ML IJ SOLN
1.0000 mg | Freq: Four times a day (QID) | INTRAMUSCULAR | Status: AC | PRN
  Administered 2015-04-22 – 2015-04-23 (×2): 1 mg via INTRAVENOUS
  Filled 2015-04-21 (×2): qty 1

## 2015-04-21 MED ORDER — ALUM & MAG HYDROXIDE-SIMETH 200-200-20 MG/5ML PO SUSP: 30.0000 mL | Freq: Four times a day (QID) | ORAL | Status: DC | PRN

## 2015-04-21 MED ORDER — DIPHENHYDRAMINE HCL 25 MG PO CAPS: 25.0000 mg | ORAL_CAPSULE | Freq: Once | ORAL | Status: DC

## 2015-04-21 MED ORDER — PNEUMOCOCCAL VAC POLYVALENT 25 MCG/0.5ML IJ INJ
0.5000 mL | INJECTION | INTRAMUSCULAR | Status: AC
  Administered 2015-04-22: 0.5 mL via INTRAMUSCULAR
  Filled 2015-04-21: qty 0.5

## 2015-04-21 MED ORDER — LORAZEPAM 1 MG PO TABS
1.0000 mg | ORAL_TABLET | Freq: Four times a day (QID) | ORAL | Status: AC | PRN
  Administered 2015-04-23: 1 mg via ORAL
  Filled 2015-04-21 (×2): qty 1

## 2015-04-21 MED ORDER — SENNOSIDES-DOCUSATE SODIUM 8.6-50 MG PO TABS
1.0000 | ORAL_TABLET | Freq: Every evening | ORAL | Status: DC | PRN
  Filled 2015-04-21: qty 1

## 2015-04-21 MED ORDER — HYDROCODONE-ACETAMINOPHEN 5-325 MG PO TABS
1.0000 | ORAL_TABLET | ORAL | Status: DC | PRN
  Administered 2015-04-21 – 2015-04-27 (×30): 2 via ORAL
  Filled 2015-04-21 (×31): qty 2

## 2015-04-21 MED ORDER — SODIUM CHLORIDE 0.9 % IJ SOLN
3.0000 mL | Freq: Two times a day (BID) | INTRAMUSCULAR | Status: DC
  Administered 2015-04-21 – 2015-05-03 (×23): 3 mL via INTRAVENOUS

## 2015-04-21 MED ORDER — ENSURE ENLIVE PO LIQD: 237.0000 mL | Freq: Two times a day (BID) | ORAL | Status: DC

## 2015-04-21 MED ORDER — FOLIC ACID 1 MG PO TABS
1.0000 mg | ORAL_TABLET | Freq: Every day | ORAL | Status: DC
  Administered 2015-04-21 – 2015-05-03 (×13): 1 mg via ORAL
  Filled 2015-04-21 (×13): qty 1

## 2015-04-21 MED ORDER — VITAMIN B-1 100 MG PO TABS
100.0000 mg | ORAL_TABLET | Freq: Every day | ORAL | Status: DC
  Administered 2015-04-21 – 2015-05-03 (×13): 100 mg via ORAL
  Filled 2015-04-21 (×14): qty 1

## 2015-04-21 MED ORDER — ENSURE ENLIVE PO LIQD
237.0000 mL | ORAL | Status: DC
  Administered 2015-04-21 – 2015-04-27 (×6): 237 mL via ORAL

## 2015-04-21 MED ORDER — ONDANSETRON HCL 4 MG/2ML IJ SOLN: 4.0000 mg | Freq: Four times a day (QID) | INTRAMUSCULAR | Status: DC | PRN

## 2015-04-21 MED ORDER — FUROSEMIDE 80 MG PO TABS
160.0000 mg | ORAL_TABLET | Freq: Three times a day (TID) | ORAL | Status: DC
  Administered 2015-04-21 – 2015-04-26 (×15): 160 mg via ORAL
  Filled 2015-04-21 (×17): qty 2

## 2015-04-21 MED ORDER — POTASSIUM CHLORIDE CRYS ER 20 MEQ PO TBCR
40.0000 meq | EXTENDED_RELEASE_TABLET | Freq: Once | ORAL | Status: AC
  Administered 2015-04-21: 40 meq via ORAL
  Filled 2015-04-21: qty 2

## 2015-04-21 MED ORDER — THIAMINE HCL 100 MG/ML IJ SOLN
100.0000 mg | Freq: Every day | INTRAMUSCULAR | Status: DC
  Filled 2015-04-21 (×6): qty 1

## 2015-04-21 MED ORDER — HEPARIN SODIUM (PORCINE) 5000 UNIT/ML IJ SOLN
5000.0000 [IU] | Freq: Three times a day (TID) | INTRAMUSCULAR | Status: DC
  Administered 2015-04-21 – 2015-05-03 (×34): 5000 [IU] via SUBCUTANEOUS
  Filled 2015-04-21 (×42): qty 1
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
## 2015-04-21 NOTE — Progress Notes (Signed)
Patient has not had much output during my shift. Bladder scanner showed greater than . MD notified. No new orders given at this time. Will make receiving RN aware.
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
## 2015-04-21 NOTE — Progress Notes (Signed)
  Echocardiogram 2D Echocardiogram has been performed.  Leta Jungling M 04/21/2015, 11:59 AM

## 2015-04-21 NOTE — Progress Notes (Signed)
Initial Nutrition Assessment  DOCUMENTATION CODES:  Not applicable  INTERVENTION:  Ensure Enlive (each supplement provides 350kcal and 20 grams of protein) Daily, snack daily  NUTRITION DIAGNOSIS:  Increased nutrient needs related to other (see comment) (ETOH abuse) as evidenced by estimated needs, per patient/family report.  GOAL:  Patient will meet greater than or equal to 90% of their needs  MONITOR:  PO intake, Labs, Weight trends, I & O's  REASON FOR ASSESSMENT:  Consult Assessment of nutrition requirement/status  ASSESSMENT: Pt is a 49yo man with PMH of DM, HTN and nephrotic range proteinuria. He presents today for 3 week history of swelling. Further symptoms include a 30 pound weight gain, pain with walking, stretching feeling of the skin, SOB, difficulty with urination due to swollen penis, nausea, heartburn. He also admits to drinking about 2-3 quarts of liquor in a week. His friend in the room notes that he drinks mostly and forgets to eat, so has a very poor diet. He also reports that he is a vegetarian, mostly.   Per RN, pt had a blowout this morning over a pack of salt and demanded to know why he is restricted (currently not restricted but he only received 1 pkt this morning which was not enough). Discussed with pt why limiting salt may be desirable considering his severe swelling. Pt states he does not eat much salt, but is unable to quantify; diet history shows high intake of processed foods and almost daily eating out. Pt claims he is mostly vegetarian, but admits to eating meat (including bacon, steak and [employer]inners) 2-3 times per day.  Pt states he is no longer on diabetic medication as his diabetes is now well controlled. Pt states he loves vegetables and eats healthy every day, this is contradicted by MD notes where pt admitted to drinking 2-3 quarts of liquor weekly and having poor diet.  Pt states he usually weighs 150 - 155 Lb and this recent gain  is fluid, possibly due to allergic reaction to Lysol. Pt is currently on 1200 ml fluid restriction. Will provide an Ensure and a snack daily to better meet pt's nutrition needs, he ate 85% of his breakfast.  Unable perform NFPE due to severe edema and pt wearing street clothes. Will continue to monitor. Labs reviewed: Na 124, K 2.9, Ca 8.1  Height:  Ht Readings from Last 1 Encounters:  04/21/15 6\' 1"  (1.854 m)    Weight:  Wt Readings from Last 1 Encounters:  04/21/15 181 lb 1.6 oz (82.146 kg)    Ideal Body Weight:  83.6 kg  Wt Readings from Last 10 Encounters:  04/21/15 181 lb 1.6 oz (82.146 kg)  10/07/13 156 lb (70.761 kg)  08/11/13 156 lb (70.761 kg)  07/29/13 156 lb (70.761 kg)  07/01/13 149 lb (67.586 kg)  05/07/13 137 lb (62.143 kg)  04/06/12 157 lb (71.215 kg)    BMI:  Body mass index is 23.9 kg/(m^2).  Estimated Nutritional Needs:  Kcal:  2200 - 2400  Protein:  100 - 115 g  Fluid:  per MD  Skin:  Reviewed, no issues  Diet Order:  Diet Carb Modified Fluid consistency:: Thin; Room service appropriate?: Yes; Fluid restriction:: 1200 mL Fluid  EDUCATION NEEDS:  Education needs addressed   Intake/Output Summary (Last 24 hours) at 04/21/15 1501 Last data filed at 04/21/15 1407  Gross per 24 hour  Intake    840 ml  Output      0 ml  Net  840 ml    Last BM:  5/20  Ziah Leandro A. [facility] Dietetic Intern Pager: [phone] 04/21/2015 3:01 PM

## 2015-04-21 NOTE — Progress Notes (Signed)
[facility]                                                                              Patient Demographics  Wesley Burns, is a 49 y.o. male, DOB - 04-24-1966, ZOX:[id number]  Admit date - 04/20/2015   Admitting Physician Inez Catalina, MD  Outpatient Primary MD for the patient is Darrow Bussing, MD  LOS - 0   Chief Complaint  Patient presents with  . Edema  . Shortness of Breath  . Diarrhea      HPI on 04/21/2015 by Dr. Debe Coder Wesley Burns is a 49yo man with PMH of DM, HTN and nephrotic range proteinuria. He presents today for 3 week history of swelling. He reports that about 3 weeks ago, he was in his normal state of health, when he saw a commercial for lysol spray that disinfects fabrics. He took his lysol spray out and sprayed his entire bed. Before completely drying, he lay down to go to sleep and woke up with a rash. Since that time he has been slowly swelling. The swelling has now reached his scrotum and abdomen and he became scared, so he presented to the ED. Further symptoms include a 30 pound weight gain, pain with walking, stretching feeling of the skin, SOB, difficulty with urination due to swollen penis, nausea, heartburn. On chart review, Wesley Burns does hold the diagnosis of DM and has had a 24 hour urine protein which was elevated in 2014. He was previously treated for his HTN and DM, but appears to have been lost to follow up. When asked, Wesley Burns has little insight in to these previous diagnoses. He also admits to drinking about 2-3 quarts of liquor in a week. His friend in the room notes that he drinks mostly and forgets to eat, so has a very poor diet. He also reports that he is a vegetarian, mostly.  Assessment & Plan   Anasarca with h/o proteinuria -Gram protein and spot urine protein pending -Antihypertensives held -Nephrology consultation appreciated -Echocardiogram: EF 55-60%, grade 1 diastolic dysfunction -Patient states that he was  seeing someone however was lost to follow-up- -also states that he is currently vegetarian and has had poor oral intake -Nutrition consulted -ANA, C3 and C4 complement, ASO pending -Renal ultrasound pending  Essential hypertension -Monitor closely -Will start oral antihypertensive, depending on findings above  DM 2, presumed uncontrolled -Hemoglobin A1c pending -Continue insulin sliding scale CBG monitoring  ETOH abuse -CIWA protocol -Encourage PO intake  Hypokalemia -Potassium 2.9 -Magnesium pending -Will replace and continue to monitor BMP  Hypervolemic Hyponatremia, moderate, likely chronic, asymptomatic -Continue fluid restriction, 1200 mL fluid restriction daily -Sodium 124 -Continue to monitor BMP  Code Status: Full  Family Communication: None at bedside   Disposition Plan: Admitted, pending further nephrology recommendations and workup   Time Spent in minutes   30 minutes  Procedures  Echocardiogram   Consults   nephrology  DVT Prophylaxis  heparin  Lab Results  Component Value Date   PLT 327 04/21/2015    Medications  Scheduled Meds: . feeding supplement (ENSURE ENLIVE)  237 mL Oral Q24H  . folic acid  1 mg Oral Daily  .  heparin  5,000 Units Subcutaneous 3 times per day  . multivitamin with minerals  1 tablet Oral Daily  . sodium chloride  3 mL Intravenous Q12H  . thiamine  100 mg Oral Daily   Or  . thiamine  100 mg Intravenous Daily   Continuous Infusions:  PRN Meds:.alum & mag hydroxide-simeth, calcium carbonate, HYDROcodone-acetaminophen, LORazepam **OR** LORazepam, ondansetron **OR** ondansetron (ZOFRAN) IV, senna-docusate  Antibiotics    Anti-infectives    None      Subjective:   Wesley Burns seen and examined today.  Patient denies any chest pain. He states that he has been having some shortness of breath with exertion. He had a 30 pound weight gain over the last several weeks. Patient denies any abdominal pain, nausea or  vomiting. He does complain of poor appetite.  Objective:   Filed Vitals:   04/21/15 0130 04/21/15 0218 04/21/15 0442 04/21/15 1025  BP: 186/111 141/97 159/87 156/96  Pulse: 87 56 86 96  Temp:  98.3 F (36.8 C) 98 F (36.7 C)   TempSrc:  Oral Axillary   Resp:  18 18   Height:  6\' 1"  (1.854 m)    Weight:  82.146 kg (181 lb 1.6 oz)    SpO2: 100% 100% 100%     Wt Readings from Last 3 Encounters:  04/21/15 82.146 kg (181 lb 1.6 oz)  10/07/13 70.761 kg (156 lb)  08/11/13 70.761 kg (156 lb)     Intake/Output Summary (Last 24 hours) at 04/21/15 1337 Last data filed at 04/21/15 1032  Gross per 24 hour  Intake    600 ml  Output      0 ml  Net    600 ml    Exam  General: Well developed, malnourished, NAD, appears stated age  HEENT: NCAT, mucous membranes moist.   Cardiovascular: S1 S2 auscultated, no rubs, murmurs or gallops. Regular rate and rhythm.  Respiratory: Clear to auscultation bilaterally with equal chest rise  Abdomen: Soft, nontender, nondistended, + bowel sounds  Extremities: warm dry without cyanosis clubbing. 2+pitting edema in LE B/L. Right foot- Transmetatarsal amputation  Neuro: AAOx3, nonfocal  Psych: Anxious, however, appropriate    Data Review   Micro Results No results found for this or any previous visit (from the past 240 hour(s)).  Radiology Reports Dg Chest 2 View  04/20/2015   CLINICAL DATA:  Edema, shortness of breath, diarrhea.  EXAM: CHEST  2 VIEW  COMPARISON:  08/16/2013  FINDINGS: There are small to moderate bilateral pleural effusions with bibasilar opacities, right greater than left. The cardiomediastinal contours are normal. Pulmonary vasculature is normal. No pneumothorax. No acute osseous abnormalities are seen.  IMPRESSION: Small to moderate bilateral pleural effusions and bibasilar opacities, right greater than left.   Electronically Signed   By: Rubye Oaks M.D.   On: 04/20/2015 23:15    CBC  Recent Labs Lab  04/20/15 1953 04/21/15 0438  WBC 5.6 7.0  HGB 12.8* 11.4*  HCT 34.7* 30.9*  PLT 353 327  MCV 92.3 92.8  MCH 34.0 34.2*  MCHC 36.9* 36.9*  RDW 13.0 13.0    Chemistries   Recent Labs Lab 04/20/15 1953 04/21/15 0438  NA 124* 124*  K 2.7* 2.9*  CL 87* 88*  CO2 28 28  GLUCOSE 93 72  BUN 9 9  CREATININE 1.18 1.20  CALCIUM 8.5* 8.1*  AST 41  --   ALT 14*  --   ALKPHOS 157*  --   BILITOT 0.6  --    ------------------------------------------------------------------------------------------------------------------  estimated creatinine clearance is 83.2 mL/min (by C-G formula based on Cr of 1.2). ------------------------------------------------------------------------------------------------------------------ No results for input(s): HGBA1C in the last 72 hours. ------------------------------------------------------------------------------------------------------------------  Recent Labs  04/21/15 0438  CHOL 142  HDL 78  LDLCALC 48  TRIG 81  CHOLHDL 1.8   ------------------------------------------------------------------------------------------------------------------ No results for input(s): TSH, T4TOTAL, T3FREE, THYROIDAB in the last 72 hours.  Invalid input(s): FREET3 ------------------------------------------------------------------------------------------------------------------ No results for input(s): VITAMINB12, FOLATE, FERRITIN, TIBC, IRON, RETICCTPCT in the last 72 hours.  Coagulation profile No results for input(s): INR, PROTIME in the last 168 hours.  No results for input(s): DDIMER in the last 72 hours.  Cardiac Enzymes No results for input(s): CKMB, TROPONINI, MYOGLOBIN in the last 168 hours.  Invalid input(s): CK ------------------------------------------------------------------------------------------------------------------ Invalid input(s): POCBNP    Jazel Nimmons D.O. on 04/21/2015 at 1:37 PM  Between 7am to 7pm - Pager -  [phone]  After 7pm go to [email or web address] - password [email or web address]  And look for the night coverage person covering for me after hours  [facility] Office  [phone]

## 2015-04-21 NOTE — Progress Notes (Signed)
AM labs drawn while patient receiving 3rd of 4 runs of IV K+. Will continue to monitor.

## 2015-04-22 DIAGNOSIS — N189 Chronic kidney disease, unspecified: Secondary | ICD-10-CM

## 2015-04-22 DIAGNOSIS — D649 Anemia, unspecified: Secondary | ICD-10-CM

## 2015-04-22 LAB — COMPREHENSIVE METABOLIC PANEL
ALBUMIN: 1.3 g/dL — AB (ref 3.5–5.0)
ALK PHOS: 128 U/L — AB (ref 38–126)
ALT: 13 U/L — AB (ref 17–63)
AST: 37 U/L (ref 15–41)
Anion gap: 7 (ref 5–15)
BUN: 12 mg/dL (ref 6–20)
CHLORIDE: 90 mmol/L — AB (ref 101–111)
CO2: 28 mmol/L (ref 22–32)
Calcium: 7.7 mg/dL — ABNORMAL LOW (ref 8.9–10.3)
Creatinine, Ser: 1.49 mg/dL — ABNORMAL HIGH (ref 0.61–1.24)
GFR, EST NON AFRICAN AMERICAN: 53 mL/min — AB (ref >60)
GLUCOSE: 75 mg/dL (ref 65–99)
POTASSIUM: 3.6 mmol/L (ref 3.5–5.1)
SODIUM: 125 mmol/L — AB (ref 135–145)
Total Bilirubin: 0.7 mg/dL (ref 0.3–1.2)
Total Protein: 4.4 g/dL — ABNORMAL LOW (ref 6.5–8.1)

## 2015-04-22 LAB — PHOSPHORUS: Phosphorus: 2.4 mg/dL — ABNORMAL LOW (ref 2.5–4.6)

## 2015-04-22 LAB — CBC
HEMATOCRIT: 28.7 % — AB (ref 39.0–52.0)
HEMOGLOBIN: 10.3 g/dL — AB (ref 13.0–17.0)
MCH: 34.4 pg — AB (ref 26.0–34.0)
MCHC: 35.9 g/dL (ref 30.0–36.0)
MCV: 96 fL (ref 78.0–100.0)
PLATELETS: 258 10*3/uL (ref 150–400)
RBC: 2.99 MIL/uL — AB (ref 4.22–5.81)
RDW: 13.2 % (ref 11.5–15.5)
WBC: 6.9 10*3/uL (ref 4.0–10.5)

## 2015-04-22 LAB — CREATININE CLEARANCE, URINE, 24 HOUR
CREATININE, URINE: 47.85 mg/dL
Collection Interval-CRCL: 24 hours
Creatinine Clearance: 22 mL/min — ABNORMAL LOW (ref 75–125)
Creatinine, 24H Ur: 479 mg/d — ABNORMAL LOW (ref 800–2000)
Urine Total Volume-CRCL: 1000 mL

## 2015-04-22 LAB — C3 COMPLEMENT: C3 COMPLEMENT: 74 mg/dL — AB (ref 82–167)

## 2015-04-22 LAB — ANTISTREPTOLYSIN O TITER: ASO: 99.3 IU/mL (ref 0.0–200.0)

## 2015-04-22 LAB — GLUCOSE, CAPILLARY: GLUCOSE-CAPILLARY: 71 mg/dL (ref 65–99)

## 2015-04-22 LAB — PARATHYROID HORMONE, INTACT (NO CA): PTH: 10 pg/mL — ABNORMAL LOW (ref 15–65)

## 2015-04-22 LAB — MAGNESIUM: Magnesium: 1 mg/dL — ABNORMAL LOW (ref 1.7–2.4)

## 2015-04-22 LAB — HEMOGLOBIN A1C
Hgb A1c MFr Bld: 4.7 % — ABNORMAL LOW (ref 4.8–5.6)
MEAN PLASMA GLUCOSE: 88 mg/dL

## 2015-04-22 LAB — C4 COMPLEMENT: Complement C4, Body Fluid: 13 mg/dL — ABNORMAL LOW (ref 14–44)

## 2015-04-22 MED ORDER — ALBUMIN HUMAN 25 % IV SOLN
12.5000 g | Freq: Once | INTRAVENOUS | Status: AC
  Administered 2015-04-22: 12.5 g via INTRAVENOUS
  Filled 2015-04-22 (×2): qty 50

## 2015-04-22 MED ORDER — MAGNESIUM SULFATE 2 GM/50ML IV SOLN
2.0000 g | Freq: Once | INTRAVENOUS | Status: AC
  Administered 2015-04-22: 2 g via INTRAVENOUS
  Filled 2015-04-22: qty 50

## 2015-04-22 MED ORDER — DIPHENHYDRAMINE HCL 25 MG PO CAPS
25.0000 mg | ORAL_CAPSULE | Freq: Three times a day (TID) | ORAL | Status: DC | PRN
  Administered 2015-04-22 – 2015-04-30 (×12): 25 mg via ORAL
  Filled 2015-04-22 (×13): qty 1

## 2015-04-22 MED ORDER — POTASSIUM CHLORIDE CRYS ER 20 MEQ PO TBCR
40.0000 meq | EXTENDED_RELEASE_TABLET | Freq: Every day | ORAL | Status: DC
  Administered 2015-04-22 – 2015-04-25 (×4): 40 meq via ORAL
  Filled 2015-04-22 (×5): qty 2

## 2015-04-22 MED ORDER — METOLAZONE 10 MG PO TABS
10.0000 mg | ORAL_TABLET | Freq: Every day | ORAL | Status: DC
  Administered 2015-04-22 – 2015-04-26 (×5): 10 mg via ORAL
  Filled 2015-04-22 (×5): qty 1

## 2015-04-22 NOTE — Progress Notes (Signed)
Pt almost fell when I assited him to the bedside commode. I set his bed alarm and asked him to call for assitance before getting out of bed, but he told the nurse assistant that I told him he could get out of bed without calling.

## 2015-04-22 NOTE — Progress Notes (Signed)
Pt asked me to apply Benadryl on his back and I applied the cream. Then the patient asked me to apply the Benadryl to his inner thighs and I told him to please apply the medication yourself because he is capable of applying the cream.

## 2015-04-22 NOTE — Progress Notes (Signed)
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  Objective: Vital signs in last 24 hours: Temp:  [97.6 F (36.4 C)-98 F (36.7 C)] 98 F (36.7 C) (05/21 0627) Pulse Rate:  [79-96] 79 (05/21 0627) Resp:  [18-20] 18 (05/21 0627) BP: (129-158)/(71-96) 158/90 mmHg (05/21 0627) SpO2:  [96 %-99 %] 96 % (05/21 0627) Weight:  [83.099 kg (183 lb 3.2 oz)] 83.099 kg (183 lb 3.2 oz) (05/21 0625) Weight change: -0.845 kg (-1 lb 13.8 oz)  Intake/Output from previous day: 05/20 0701 - 05/21 0700 In: 1720 [P.O.:1320; IV Piggyback:400] Out: 1 [Emesis/NG output:1] Intake/Output this shift:    General appearance: alert and talkative and goes between topics Resp: diminished breath sounds bilaterally and rales bibasilar Cardio: S1, S2 normal and systolic murmur: holosystolic 2/6, blowing at apex GI: abdm wall edema, pos bs Extremities: edema 4+ anasarca, not sure understands or has insight  Lab Results:  Recent Labs  04/21/15 0438 04/22/15 0338  WBC 7.0 6.9  HGB 11.4* 10.3*  HCT 30.9* 28.7*  PLT 327 258   BMET:  Recent Labs  04/21/15 0438 04/22/15 0338  NA 124* 125*  K 2.9* 3.6  CL 88* 90*  CO2 28 28  GLUCOSE 72 75  BUN 9 12  CREATININE 1.20 1.49*  CALCIUM 8.1* 7.7*    Recent Labs  04/21/15 1644  PTH 10*   Iron Studies:  Recent Labs  04/21/15 1644  IRON 105  TIBC 182*    Studies/Results: Dg Chest 2 View  04/20/2015   CLINICAL DATA:  Edema, shortness of breath, diarrhea.  EXAM: CHEST  2 VIEW  COMPARISON:  08/16/2013  FINDINGS: There are small to moderate bilateral pleural effusions with bibasilar opacities, right greater than left. The cardiomediastinal contours are normal. Pulmonary vasculature is normal. No pneumothorax. No acute osseous abnormalities are seen.  IMPRESSION: Small to moderate bilateral pleural effusions and bibasilar opacities, right greater than left.   Electronically Signed   By: Rubye Oaks M.D.   On: 04/20/2015 23:15   US Renal  04/21/2015   CLINICAL DATA:  Elevated creatinine.  Chronic kidney disease.  EXAM: RENAL / URINARY TRACT ULTRASOUND COMPLETE  COMPARISON:  None.  FINDINGS: Right Kidney:  Length: 12.9 cm.  No hydronephrosis or mass.  Increased echotexture.  Left Kidney:  Length: 12.6 cm.  No hydronephrosis or mass.  Increased echotexture.  Bladder:  Partially decompressed. Bladder wall appears thickened, likely to decompressed state.  IMPRESSION: Increased echotexture within the kidneys suggesting chronic medical renal disease. No hydronephrosis.   Electronically Signed   By: Charlett Nose M.D.   On: 04/21/2015 21:38    I have reviewed the patient's current medications.  Assessment/Plan: 1 Anasarca ?? Diuresing. Not coop with nursing staff to monitor.  U/S with some echodensity.  low Comp ? Low alb vs renal.  Awaiting other serologies.  24 h urine 2 CKD 3 low K replete 4 PVD 5 Hip disloc 6 HTN better with lower vol P lasix, add Zarox, give K, await blood tests.    LOS: 1 day   Fawna Cranmer L 04/22/2015,8:35 AM

## 2015-04-22 NOTE — Progress Notes (Signed)
[facility]                                                                              Patient Demographics  Wesley Burns, is a 49 y.o. male, DOB - 1966-11-16, [id number]:[id number]  Admit date - 04/20/2015   Admitting Physician Sid Falcon, MD  Outpatient Primary MD for the patient is No PCP Per Patient  LOS - 1   Chief Complaint  Patient presents with  . Edema  . Shortness of Breath  . Diarrhea      HPI on 04/21/2015 by Dr. Gilles Chiquito Wesley Burns is a 49yo man with PMH of DM, HTN and nephrotic range proteinuria. He presents today for 3 week history of swelling. He reports that about 3 weeks ago, he was in his normal state of health, when he saw a commercial for lysol spray that disinfects fabrics. He took his lysol spray out and sprayed his entire bed. Before completely drying, he lay down to go to sleep and woke up with a rash. Since that time he has been slowly swelling. The swelling has now reached his scrotum and abdomen and he became scared, so he presented to the ED. Further symptoms include a 30 pound weight gain, pain with walking, stretching feeling of the skin, SOB, difficulty with urination due to swollen penis, nausea, heartburn. On chart review, Wesley Burns does hold the diagnosis of DM and has had a 24 hour urine protein which was elevated in 2014. He was previously treated for his HTN and DM, but appears to have been lost to follow up. When asked, Wesley Burns has little insight in to these previous diagnoses. He also admits to drinking about 2-3 quarts of liquor in a week. His friend in the room notes that he drinks mostly and forgets to eat, so has a very poor diet. He also reports that he is a vegetarian, mostly.  Assessment & Plan   Anasarca with h/o proteinuria -Antihypertensives held initially -Nephrology consultation appreciated and started patient on Lasix 151m PO TID and added metolazone -Continue to monitor intake/output, daily  weights -Echocardiogram: EF 596-78% grade 1 diastolic dysfunction -Patient states that he was seeing someone however was lost to follow-up and has poor oral intake -Renal utrasound: increased echotexture, suggesting chronic medical renal disease, no hydronephrosis -Nutrition consulted -Mpo/pr3 ANCA pending, SPEP, UPEP pending, hep C pending -C3 and C4 complement low, ASO normal -HIV and Hepatitis B, nonreactive -LFTs: AST/ALT and bilirubin normal, Alk phos lower than previously in 2014  Normocytic Anemia -No evidence of bleeding -Anemia panel: shows adequate iron -continue to monitor CBC  AKI/CKD -Per Renal [place] patient has chronic medical renal disease -Cr 1.49 today, likely secondary to lasix -Continue to monitor BMP  Essential hypertension -Monitor closely -Continue lasix -Metolazone added today  DM 2, presumed uncontrolled -Hemoglobin A1c 4.7 -Continue insulin sliding scale CBG monitoring  ETOH abuse -CIWA protocol -Encourage PO intake  Hypokalemia -Potassium 3.6 -Magnesium 1.0, will replace -Continue to monitor BMP  Hypervolemic Hyponatremia, moderate, likely chronic, asymptomatic -Continue fluid restriction, 1200 mL fluid restriction daily -Sodium 125 -Continue to monitor BMP  Code Status: Full  Family Communication: None at bedside   Disposition  Plan: Admitted, pending further nephrology recommendations and workup   Time Spent in minutes   30 minutes  Procedures  Echocardiogram   Consults   nephrology  DVT Prophylaxis  heparin  Lab Results  Component Value Date   PLT 258 04/22/2015    Medications  Scheduled Meds: . albumin human  12.5 g Intravenous Once  . feeding supplement (ENSURE ENLIVE)  237 mL Oral Q24H  . folic acid  1 mg Oral Daily  . furosemide  160 mg Oral TID  . heparin  5,000 Units Subcutaneous 3 times per day  . metolazone  10 mg Oral Daily  . multivitamin with minerals  1 tablet Oral Daily  . pneumococcal 23 valent vaccine   0.5 mL Intramuscular Tomorrow-1000  . potassium chloride  40 mEq Oral Daily  . sodium chloride  3 mL Intravenous Q12H  . thiamine  100 mg Oral Daily   Or  . thiamine  100 mg Intravenous Daily   Continuous Infusions:  PRN Meds:.alum & mag hydroxide-simeth, calcium carbonate, diphenhydrAMINE-zinc acetate, HYDROcodone-acetaminophen, LORazepam **OR** LORazepam, ondansetron **OR** ondansetron (ZOFRAN) IV, senna-docusate  Antibiotics    Anti-infectives    None      Subjective:   Wesley Burns seen and examined today.  Patient states he does not know what he is supposed to do and feels he has gained weight since being admitted.  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  He complains of pain and wants a vicodin, but cannot elaborate on his pain.  Denies any chest pain, abdominal pain, headache, dizziness.  Has shortness of breath with exertion.  Objective:   Filed Vitals:   04/21/15 1617 04/21/15 2121 04/22/15 0625 04/22/15 0627  BP: 129/82 134/71  158/90  Pulse: 83 79  79  Temp:  97.6 F (36.4 C)  98 F (36.7 C)  TempSrc:  Oral  Oral  Resp:  18  18  Height:      Weight:   83.099 kg (183 lb 3.2 oz)   SpO2:  97%  96%    Wt Readings from Last 3 Encounters:  04/22/15 83.099 kg (183 lb 3.2 oz)  10/07/13 70.761 kg (156 lb)  08/11/13 70.761 kg (156 lb)     Intake/Output Summary (Last 24 hours) at 04/22/15 1033 Last data filed at 04/22/15 8309  Gross per 24 hour  Intake    840 ml  Output      1 ml  Net    839 ml    Exam  General: Well developed, malnourished, no distress  HEENT: NCAT, mucous membranes moist.   Cardiovascular: S1 S2 auscultated, 2/6 SEM, Regular rate and rhythm.  Respiratory: Slightly diminished  Abdomen: Soft, nontender, nondistended, + bowel sounds, abdominal wall edema  Extremities: warm dry without cyanosis clubbing. +2pitting edema in LE B/L. Right foot- Transmetatarsal amputation  Neuro: AAOx3, nonfocal  Psych: Appropriate.  Not sure patient understands  actual disease process and is aware of his health  Data Review   Micro Results No results found for this or any previous visit (from the past 240 hour(s)).  Radiology Reports Dg Chest 2 View  04/20/2015   CLINICAL DATA:  Edema, shortness of breath, diarrhea.  EXAM: CHEST  2 VIEW  COMPARISON:  08/16/2013  FINDINGS: There are small to moderate bilateral pleural effusions with bibasilar opacities, right greater than left. The cardiomediastinal contours are normal. Pulmonary vasculature is normal. No pneumothorax. No acute osseous abnormalities are seen.  IMPRESSION: Small to moderate bilateral pleural effusions and bibasilar  opacities, right greater than left.   Electronically Signed   By: Jeb Levering M.D.   On: 04/20/2015 23:15   US Renal  04/21/2015   CLINICAL DATA:  Elevated creatinine.  Chronic kidney disease.  EXAM: RENAL / URINARY TRACT ULTRASOUND COMPLETE  COMPARISON:  None.  FINDINGS: Right Kidney:  Length: 12.9 cm.  No hydronephrosis or mass.  Increased echotexture.  Left Kidney:  Length: 12.6 cm.  No hydronephrosis or mass.  Increased echotexture.  Bladder:  Partially decompressed. Bladder wall appears thickened, likely to decompressed state.  IMPRESSION: Increased echotexture within the kidneys suggesting chronic medical renal disease. No hydronephrosis.   Electronically Signed   By: Rolm Baptise M.D.   On: 04/21/2015 21:38    CBC  Recent Labs Lab 04/20/15 1953 04/21/15 0438 04/22/15 0338  WBC 5.6 7.0 6.9  HGB 12.8* 11.4* 10.3*  HCT 34.7* 30.9* 28.7*  PLT 353 327 258  MCV 92.3 92.8 96.0  MCH 34.0 34.2* 34.4*  MCHC 36.9* 36.9* 35.9  RDW 13.0 13.0 13.2    Chemistries   Recent Labs Lab 04/20/15 1953 04/21/15 0438 04/22/15 0338  NA 124* 124* 125*  K 2.7* 2.9* 3.6  CL 87* 88* 90*  CO2 28 28 28   GLUCOSE 93 72 75  BUN 9 9 12   CREATININE 1.18 1.20 1.49*  CALCIUM 8.5* 8.1* 7.7*  MG  --   --  1.0*  AST 41  --  37  ALT 14*  --  13*  ALKPHOS 157*  --  128*  BILITOT  0.6  --  0.7   ------------------------------------------------------------------------------------------------------------------ estimated creatinine clearance is 67 mL/min (by C-G formula based on Cr of 1.49). ------------------------------------------------------------------------------------------------------------------  Recent Labs  04/21/15 0438  HGBA1C 4.7*   ------------------------------------------------------------------------------------------------------------------  Recent Labs  04/21/15 0438  CHOL 142  HDL 78  LDLCALC 48  TRIG 81  CHOLHDL 1.8   ------------------------------------------------------------------------------------------------------------------ No results for input(s): TSH, T4TOTAL, T3FREE, THYROIDAB in the last 72 hours.  Invalid input(s): FREET3 ------------------------------------------------------------------------------------------------------------------  Recent Labs  04/21/15 1644  TIBC 182*  IRON 105    Coagulation profile No results for input(s): INR, PROTIME in the last 168 hours.  No results for input(s): DDIMER in the last 72 hours.  Cardiac Enzymes No results for input(s): CKMB, TROPONINI, MYOGLOBIN in the last 168 hours.  Invalid input(s): CK ------------------------------------------------------------------------------------------------------------------ Invalid input(s): POCBNP    Anquinette Pierro D.O. on 04/22/2015 at 10:33 AM  Between 7am to 7pm - Pager - [phone]  After 7pm go to [email or web address] - password [email or web address]  And look for the night coverage person covering for me after hours  [facility] Office  [phone]

## 2015-04-23 LAB — CBC
HCT: 33 % — ABNORMAL LOW (ref 39.0–52.0)
HEMOGLOBIN: 11.5 g/dL — AB (ref 13.0–17.0)
MCH: 33.8 pg (ref 26.0–34.0)
MCHC: 34.8 g/dL (ref 30.0–36.0)
MCV: 97.1 fL (ref 78.0–100.0)
Platelets: 271 10*3/uL (ref 150–400)
RBC: 3.4 MIL/uL — AB (ref 4.22–5.81)
RDW: 13.3 % (ref 11.5–15.5)
WBC: 4.4 10*3/uL (ref 4.0–10.5)

## 2015-04-23 LAB — RENAL FUNCTION PANEL
ALBUMIN: 1.6 g/dL — AB (ref 3.5–5.0)
ANION GAP: 6 (ref 5–15)
BUN: 13 mg/dL (ref 6–20)
CHLORIDE: 90 mmol/L — AB (ref 101–111)
CO2: 32 mmol/L (ref 22–32)
Calcium: 7.9 mg/dL — ABNORMAL LOW (ref 8.9–10.3)
Creatinine, Ser: 1.63 mg/dL — ABNORMAL HIGH (ref 0.61–1.24)
GFR calc Af Amer: 55 mL/min — ABNORMAL LOW (ref >60)
GFR, EST NON AFRICAN AMERICAN: 48 mL/min — AB (ref >60)
Glucose, Bld: 93 mg/dL (ref 65–99)
Phosphorus: 1.8 mg/dL — ABNORMAL LOW (ref 2.5–4.6)
Potassium: 4.1 mmol/L (ref 3.5–5.1)
Sodium: 128 mmol/L — ABNORMAL LOW (ref 135–145)

## 2015-04-23 LAB — GLUCOSE, CAPILLARY
GLUCOSE-CAPILLARY: 68 mg/dL (ref 65–99)
Glucose-Capillary: 92 mg/dL (ref 65–99)

## 2015-04-23 LAB — CREATININE, URINE, RANDOM: Creatinine, Urine: 76.08 mg/dL

## 2015-04-23 LAB — SODIUM, URINE, RANDOM: SODIUM UR: 73 mmol/L

## 2015-04-23 NOTE — Progress Notes (Signed)
[facility]                                                                              Patient Demographics  Wesley Burns, is a 49 y.o. male, DOB - 1966-02-22, [id number]  Admit date - 04/20/2015   Admitting Physician Sid Falcon, MD  Outpatient Primary MD for the patient is No PCP Per Patient  LOS - 2   Chief Complaint  Patient presents with  . Edema  . Shortness of Breath  . Diarrhea      HPI on 04/21/2015 by Dr. Gilles Chiquito Wesley Burns is a 49yo man with PMH of DM, HTN and nephrotic range proteinuria. He presents today for 3 week history of swelling. He reports that about 3 weeks ago, he was in his normal state of health, when he saw a commercial for lysol spray that disinfects fabrics. He took his lysol spray out and sprayed his entire bed. Before completely drying, he lay down to go to sleep and woke up with a rash. Since that time he has been slowly swelling. The swelling has now reached his scrotum and abdomen and he became scared, so he presented to the ED. Further symptoms include a 30 pound weight gain, pain with walking, stretching feeling of the skin, SOB, difficulty with urination due to swollen penis, nausea, heartburn. On chart review, Wesley Burns does hold the diagnosis of DM and has had a 24 hour urine protein which was elevated in 2014. He was previously treated for his HTN and DM, but appears to have been lost to follow up. When asked, Wesley Burns has little insight in to these previous diagnoses. He also admits to drinking about 2-3 quarts of liquor in a week. His friend in the room notes that he drinks mostly and forgets to eat, so has a very poor diet. He also reports that he is a vegetarian, mostly.  Assessment & Plan   Anasarca with h/o proteinuria -Antihypertensives held initially -Nephrology consultation appreciated - continue Lasix 128m PO TID and metolazone -Continue to monitor intake/output, daily weights, UO 10283mover past 24  hours -Echocardiogram: EF 5542-87%grade 1 diastolic dysfunction -Patient states that he was seeing someone however was lost to follow-up and has poor oral intake -Renal utrasound: increased echotexture, suggesting chronic medical renal disease, no hydronephrosis -Nutrition consulted -Mpo/pr3 ANCA, Anti-ds DNA ab, SPEP, UPEP, hep C pending -C3 and C4 complement low, ASO normal -HIV and Hepatitis B, nonreactive -LFTs: AST/ALT and bilirubin normal, Alk phos lower than previously in 2014 -Instructed nursing and patient to keep genitalia elevated  Normocytic Anemia -No evidence of bleeding, Hemoglobin remains stable 11.5 -Anemia panel: shows adequate iron -continue to monitor CBC  AKI/CKD -Per [facility]al [place] has chronic medical renal disease -Cr 1.63 today, likely secondary to diurectics  -Continue to monitor BMP  Essential hypertension -Monitor closely -Continue lasix and metolazone  DM 2 -Hemoglobin A1c 4.7 -Continue insulin sliding scale CBG monitoring  ETOH abuse -CIWA protocol -Encourage PO intake  Hypokalemia -Resolved, Potassium 4.1 -Magnesium 1.0, was replaced -Continue to monitor BMP  Hypervolemic Hyponatremia, moderate, likely chronic, asymptomatic -Continue fluid restriction, 1200 mL fluid restriction daily -Sodium 128 -Continue to  monitor BMP  Code Status: Full  Family Communication: None at bedside   Disposition Plan: Admitted, pending further workup and diuresis  Time Spent in minutes   30 minutes  Procedures  Echocardiogram  Renal US  Consults   Nephrology  DVT Prophylaxis  heparin  Lab Results  Component Value Date   PLT 271 04/23/2015    Medications  Scheduled Meds: . feeding supplement (ENSURE ENLIVE)  237 mL Oral Q24H  . folic acid  1 mg Oral Daily  . furosemide  160 mg Oral TID  . heparin  5,000 Units Subcutaneous 3 times per day  . metolazone  10 mg Oral Daily  . multivitamin with minerals  1 tablet Oral Daily  . potassium  chloride  40 mEq Oral Daily  . sodium chloride  3 mL Intravenous Q12H  . thiamine  100 mg Oral Daily   Or  . thiamine  100 mg Intravenous Daily   Continuous Infusions:  PRN Meds:.alum & mag hydroxide-simeth, calcium carbonate, diphenhydrAMINE, diphenhydrAMINE-zinc acetate, HYDROcodone-acetaminophen, LORazepam **OR** LORazepam, ondansetron **OR** ondansetron (ZOFRAN) IV, senna-docusate  Antibiotics    Anti-infectives    None      Subjective:   Wesley Burns seen and examined today.  Patient complains that his breakfast is late and cold.  He feels his breathing has improved and is feeling better today, but is very hungry.  He is worried about his penile swelling.  Denies any chest pain, abdominal pain, headache, dizziness.  Has shortness of breath with exertion.  Objective:   Filed Vitals:   04/22/15 2122 04/22/15 2323 04/23/15 0018 04/23/15 0554  BP: 170/89 165/94 146/88 158/99  Pulse: 89 88 91 86  Temp: 98.6 F (37 C)   98.2 F (36.8 C)  TempSrc: Oral   Oral  Resp: 18   18  Height:      Weight:    81.693 kg (180 lb 1.6 oz)  SpO2: 98%   98%    Wt Readings from Last 3 Encounters:  04/23/15 81.693 kg (180 lb 1.6 oz)  10/07/13 70.761 kg (156 lb)  08/11/13 70.761 kg (156 lb)     Intake/Output Summary (Last 24 hours) at 04/23/15 1146 Last data filed at 04/23/15 0607  Gross per 24 hour  Intake    360 ml  Output    825 ml  Net   -465 ml    Exam  General: Well developed,  no distress  HEENT: NCAT, mucous membranes moist.   Cardiovascular: S1 S2 auscultated, 2/6 SEM, Regular rate and rhythm.  Respiratory: Slightly diminished  Abdomen: Soft, nontender, nondistended, + bowel sounds, abdominal wall edema  Extremities: warm dry without cyanosis clubbing. +2pitting edema in LE B/L. Right foot- Transmetatarsal amputation  Neuro: AAOx3, nonfocal  Data Review   Micro Results No results found for this or any previous visit (from the past 240 hour(s)).  Radiology  Reports Dg Chest 2 View  04/20/2015   CLINICAL DATA:  Edema, shortness of breath, diarrhea.  EXAM: CHEST  2 VIEW  COMPARISON:  08/16/2013  FINDINGS: There are small to moderate bilateral pleural effusions with bibasilar opacities, right greater than left. The cardiomediastinal contours are normal. Pulmonary vasculature is normal. No pneumothorax. No acute osseous abnormalities are seen.  IMPRESSION: Small to moderate bilateral pleural effusions and bibasilar opacities, right greater than left.   Electronically Signed   By: Jeb Levering M.D.   On: 04/20/2015 23:15   US Renal  04/21/2015   CLINICAL DATA:  Elevated creatinine.  Chronic kidney disease.  EXAM: RENAL / URINARY TRACT ULTRASOUND COMPLETE  COMPARISON:  None.  FINDINGS: Right Kidney:  Length: 12.9 cm.  No hydronephrosis or mass.  Increased echotexture.  Left Kidney:  Length: 12.6 cm.  No hydronephrosis or mass.  Increased echotexture.  Bladder:  Partially decompressed. Bladder wall appears thickened, likely to decompressed state.  IMPRESSION: Increased echotexture within the kidneys suggesting chronic medical renal disease. No hydronephrosis.   Electronically Signed   By: Rolm Baptise M.D.   On: 04/21/2015 21:38    CBC  Recent Labs Lab 04/20/15 1953 04/21/15 0438 04/22/15 0338 04/23/15 0540  WBC 5.6 7.0 6.9 4.4  HGB 12.8* 11.4* 10.3* 11.5*  HCT 34.7* 30.9* 28.7* 33.0*  PLT 353 327 258 271  MCV 92.3 92.8 96.0 97.1  MCH 34.0 34.2* 34.4* 33.8  MCHC 36.9* 36.9* 35.9 34.8  RDW 13.0 13.0 13.2 13.3    Chemistries   Recent Labs Lab 04/20/15 1953 04/21/15 0438 04/22/15 0338 04/23/15 0540  NA 124* 124* 125* 128*  K 2.7* 2.9* 3.6 4.1  CL 87* 88* 90* 90*  CO2 _0 32  GLUCOSE 93 72 75 93  BUN _1 CREATININE 1.18 1.20 1.49* 1.63*  CALCIUM 8.5* 8.1* 7.7* 7.9*  MG  --   --  1.0*  --   AST 41  --  37  --   ALT 14*  --  13*  --   ALKPHOS 157*  --  128*  --   BILITOT 0.6  --  0.7  --     ------------------------------------------------------------------------------------------------------------------ estimated creatinine clearance is 61.3 mL/min (by C-G formula based on Cr of 1.63). ------------------------------------------------------------------------------------------------------------------  Recent Labs  04/21/15 0438  HGBA1C 4.7*   ------------------------------------------------------------------------------------------------------------------  Recent Labs  04/21/15 0438  CHOL 142  HDL 78  LDLCALC 48  TRIG 81  CHOLHDL 1.8   ------------------------------------------------------------------------------------------------------------------ No results for input(s): TSH, T4TOTAL, T3FREE, THYROIDAB in the last 72 hours.  Invalid input(s): FREET3 ------------------------------------------------------------------------------------------------------------------  Recent Labs  04/21/15 1644  TIBC 182*  IRON 105    Coagulation profile No results for input(s): INR, PROTIME in the last 168 hours.  No results for input(s): DDIMER in the last 72 hours.  Cardiac Enzymes No results for input(s): CKMB, TROPONINI, MYOGLOBIN in the last 168 hours.  Invalid input(s): CK ------------------------------------------------------------------------------------------------------------------ Invalid input(s): POCBNP    Yarieliz Wasser D.O. on 04/23/2015 at 11:46 AM  Between 7am to 7pm - Pager - [phone]  After 7pm go to [email or web address] - password [email or web address]  And look for the night coverage person covering for me after hours  [facility] Office  [phone]

## 2015-04-23 NOTE — Progress Notes (Signed)
Subjective: Interval History: has complaints breakfast is not here.  He is making nurses record urine.  Objective: Vital signs in last 24 hours: Temp:  [98.2 F (36.8 C)-98.6 F (37 C)] 98.2 F (36.8 C) (05/22 0554) Pulse Rate:  [86-118] 86 (05/22 0554) Resp:  [18-20] 18 (05/22 0554) BP: (134-170)/(84-99) 158/99 mmHg (05/22 0554) SpO2:  [98 %] 98 % (05/22 0554) Weight:  [81.693 kg (180 lb 1.6 oz)] 81.693 kg (180 lb 1.6 oz) (05/22 0554) Weight change: -1.406 kg (-3 lb 1.6 oz)  Intake/Output from previous day: 05/21 0701 - 05/22 0700 In: 480 [P.O.:480] Out: 1025 [Urine:1025] Intake/Output this shift:    General appearance: cooperative, pale and edematous Resp: diminished breath sounds bilaterally and rales bibasilar Cardio: S1, S2 normal and systolic murmur: holosystolic 2/6, blowing at apex GI: pos bs, distended Extremities: edema 4+ but not as tight  Lab Results:  Recent Labs  04/22/15 0338 04/23/15 0540  WBC 6.9 4.4  HGB 10.3* 11.5*  HCT 28.7* 33.0*  PLT 258 271   BMET:  Recent Labs  04/22/15 0338 04/23/15 0540  NA 125* 128*  K 3.6 4.1  CL 90* 90*  CO2 28 32  GLUCOSE 75 93  BUN 12 13  CREATININE 1.49* 1.63*  CALCIUM 7.7* 7.9*    Recent Labs  04/21/15 1644  PTH 10*   Iron Studies:  Recent Labs  04/21/15 1644  IRON 105  TIBC 182*    Studies/Results: US Renal  04/21/2015   CLINICAL DATA:  Elevated creatinine.  Chronic kidney disease.  EXAM: RENAL / URINARY TRACT ULTRASOUND COMPLETE  COMPARISON:  None.  FINDINGS: Right Kidney:  Length: 12.9 cm.  No hydronephrosis or mass.  Increased echotexture.  Left Kidney:  Length: 12.6 cm.  No hydronephrosis or mass.  Increased echotexture.  Bladder:  Partially decompressed. Bladder wall appears thickened, likely to decompressed state.  IMPRESSION: Increased echotexture within the kidneys suggesting chronic medical renal disease. No hydronephrosis.   Electronically Signed   By: Charlett Nose M.D.   On: 04/21/2015  21:38    I have reviewed the patient's current medications.  Assessment/Plan: 1 Anasarca, proteinuria.  Not as large of proteinuria as expected, suggesting synthetic defect, ie liver dz.  Serologies pending.  Does have hematuria also.  Diruresing 2 Anemia 3 hyponatremia improving with diuresis. 4 smoking 5 ? ETOH abuse P lasix/zarox/serologies    LOS: 2 days   Aleksa Collinsworth L 04/23/2015,9:18 AM

## 2015-04-24 DIAGNOSIS — F1010 Alcohol abuse, uncomplicated: Secondary | ICD-10-CM | POA: Diagnosis present

## 2015-04-24 LAB — CBC
HEMATOCRIT: 29.8 % — AB (ref 39.0–52.0)
Hemoglobin: 10.3 g/dL — ABNORMAL LOW (ref 13.0–17.0)
MCH: 34.1 pg — ABNORMAL HIGH (ref 26.0–34.0)
MCHC: 34.6 g/dL (ref 30.0–36.0)
MCV: 98.7 fL (ref 78.0–100.0)
Platelets: 228 10*3/uL (ref 150–400)
RBC: 3.02 MIL/uL — AB (ref 4.22–5.81)
RDW: 13.3 % (ref 11.5–15.5)
WBC: 4.8 10*3/uL (ref 4.0–10.5)

## 2015-04-24 LAB — MPO/PR-3 (ANCA) ANTIBODIES
ANCA Proteinase 3: <3.5 U/mL (ref 0.0–3.5)
ANCA Proteinase 3: <3.5 U/mL (ref 0.0–3.5)
Myeloperoxidase Abs: <9 U/mL (ref 0.0–9.0)

## 2015-04-24 LAB — PROTEIN ELECTROPHORESIS, SERUM
A/G Ratio: 0.6 — ABNORMAL LOW (ref 0.7–2.0)
ALPHA-2-GLOBULIN: 0.7 g/dL (ref 0.4–1.2)
Albumin ELP: 1.4 g/dL — ABNORMAL LOW (ref 3.2–5.6)
Alpha-1-Globulin: 0.2 g/dL (ref 0.1–0.4)
Beta Globulin: 0.9 g/dL (ref 0.6–1.3)
GLOBULIN, TOTAL: 2.3 g/dL (ref 2.0–4.5)
Gamma Globulin: 0.5 g/dL (ref 0.5–1.6)
TOTAL PROTEIN ELP: 3.7 g/dL — AB (ref 6.0–8.5)

## 2015-04-24 LAB — GLUCOSE, CAPILLARY: GLUCOSE-CAPILLARY: 80 mg/dL (ref 65–99)

## 2015-04-24 LAB — ANTI-DNA ANTIBODY, DOUBLE-STRANDED

## 2015-04-24 LAB — RENAL FUNCTION PANEL
Albumin: 1.4 g/dL — ABNORMAL LOW (ref 3.5–5.0)
Anion gap: 8 (ref 5–15)
BUN: 16 mg/dL (ref 6–20)
CHLORIDE: 94 mmol/L — AB (ref 101–111)
CO2: 29 mmol/L (ref 22–32)
CREATININE: 1.77 mg/dL — AB (ref 0.61–1.24)
Calcium: 7.4 mg/dL — ABNORMAL LOW (ref 8.9–10.3)
GFR calc Af Amer: 50 mL/min — ABNORMAL LOW (ref >60)
GFR, EST NON AFRICAN AMERICAN: 43 mL/min — AB (ref >60)
GLUCOSE: 113 mg/dL — AB (ref 65–99)
Phosphorus: 1.7 mg/dL — ABNORMAL LOW (ref 2.5–4.6)
Potassium: 4.6 mmol/L (ref 3.5–5.1)
Sodium: 131 mmol/L — ABNORMAL LOW (ref 135–145)

## 2015-04-24 LAB — OSMOLALITY, URINE: Osmolality, Ur: 254 mOsm/kg — ABNORMAL LOW (ref 390–1090)

## 2015-04-24 LAB — MAGNESIUM: MAGNESIUM: 1 mg/dL — AB (ref 1.7–2.4)

## 2015-04-24 LAB — HEPATITIS C ANTIBODY (REFLEX): HCV Ab: NEGATIVE

## 2015-04-24 MED ORDER — MAGNESIUM SULFATE 2 GM/50ML IV SOLN
2.0000 g | Freq: Once | INTRAVENOUS | Status: AC
  Administered 2015-04-24: 2 g via INTRAVENOUS
  Filled 2015-04-24: qty 50

## 2015-04-24 MED ORDER — ALBUMIN HUMAN 25 % IV SOLN
12.5000 g | Freq: Once | INTRAVENOUS | Status: AC
  Administered 2015-04-24: 12.5 g via INTRAVENOUS
  Filled 2015-04-24: qty 50

## 2015-04-24 MED ORDER — LORAZEPAM 0.5 MG PO TABS
0.5000 mg | ORAL_TABLET | Freq: Four times a day (QID) | ORAL | Status: DC | PRN
  Administered 2015-04-24 – 2015-04-26 (×8): 0.5 mg via ORAL
  Filled 2015-04-24 (×8): qty 1

## 2015-04-24 MED ORDER — LORAZEPAM 1 MG PO TABS
1.0000 mg | ORAL_TABLET | Freq: Once | ORAL | Status: AC
  Administered 2015-04-24: 1 mg via ORAL

## 2015-04-24 MED ORDER — SODIUM PHOSPHATE 3 MMOLE/ML IV SOLN
25.0000 mmol | Freq: Once | INTRAVENOUS | Status: AC
  Administered 2015-04-24: 25 mmol via INTRAVENOUS
  Filled 2015-04-24: qty 8.33
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
## 2015-04-24 NOTE — Progress Notes (Signed)
[facility]                                                                              Patient Demographics  Wesley Burns, is a 49 y.o. male, DOB - 07-Aug-1966, UUV:[id number]  Admit date - 04/20/2015   Admitting Physician Wesley Falcon, MD  Outpatient Primary MD for the patient is No PCP Per Patient  LOS - 3   Chief Complaint  Patient presents with  . Edema  . Shortness of Breath  . Diarrhea      HPI on 04/21/2015 by Dr. Gilles Burns Wesley Burns is a 49yo man with PMH of DM, HTN and nephrotic range proteinuria. He presents today for 3 week history of swelling. He reports that about 3 weeks ago, he was in his normal state of health, when he saw a commercial for lysol spray that disinfects fabrics. He took his lysol spray out and sprayed his entire bed. Before completely drying, he lay down to go to sleep and woke up with a rash. Since that time he has been slowly swelling. The swelling has now reached his scrotum and abdomen and he became scared, so he presented to the ED. Further symptoms include a 30 pound weight gain, pain with walking, stretching feeling of the skin, SOB, difficulty with urination due to swollen penis, nausea, heartburn. On chart review, Wesley Burns does hold the diagnosis of DM and has had a 24 hour urine protein which was elevated in 2014. He was previously treated for his HTN and DM, but appears to have been lost to follow up. When asked, Wesley Burns has little insight in to these previous diagnoses. He also admits to drinking about 2-3 quarts of liquor in a week. His friend in the room notes that he drinks mostly and forgets to eat, so has a very poor diet. He also reports that he is a vegetarian, mostly.  Assessment & Plan   Anasarca with h/o proteinuria -Antihypertensives held initially -Possibly multifactorial, renal vs liver vs alcohol abuse -Nephrology consultation appreciated - continue Lasix 163m PO TID and metolazone -Continue to  monitor intake/output, daily weights, UO 10260mover past 24 hours -Echocardiogram: EF 5547-42%grade 1 diastolic dysfunction -Patient states that he was seeing someone however was lost to follow-up and has poor oral intake -Renal utrasound: increased echotexture, suggesting chronic medical renal disease, no hydronephrosis -Nutrition consulted -Mpo/pr3 ANCA, Anti-ds DNA ab, SPEP, UPEP, hep C pending -C3 and C4 complement low, ASO normal -HIV and Hepatitis B, nonreactive -LFTs: AST/ALT and bilirubin normal, Alk phos lower than previously in 2014 -Instructed nursing and patient to keep genitalia elevated -Will order additional dose of albumin  Normocytic Anemia -No evidence of bleeding, Hemoglobin remains stable 10.3 -Anemia panel: shows adequate iron -continue to monitor CBC  AKI/CKD -Per Renal USKoreapatient has chronic medical renal disease -Cr 1.77 today, likely secondary to diurectics  -Continue to monitor BMP -Spoke with nephrology, patient is to continue diuretics for now, he may need dialysis in the future  Essential hypertension -Monitor closely -Continue lasix and metolazone  DM 2 -Hemoglobin A1c 4.7 -Continue insulin sliding scale CBG monitoring  ETOH abuse/Depression -CIWA protocol -Encourage PO intake -Spoke  with friend who states he drinks "a lot, he's a heavy drinker" -Will consult psychiatry   Hypokalemia -Resolved, Potassium 4.6 -Magnesium still 1.0 after magnesium replacement, will continue to replace and monitor -Continue to monitor BMP  Hypervolemic Hyponatremia, moderate, likely chronic, asymptomatic -Continue fluid restriction, 1200 mL fluid restriction daily -Sodium 131 -Continue to monitor BMP  Code Status: Full  Family Communication: None at bedside, spoke with friend via phone   Disposition Plan: Admitted, pending further workup and diuresis.   Time Spent in minutes   30 minutes  Procedures  Echocardiogram  Renal US  Consults     Nephrology Psychiatry  DVT Prophylaxis  heparin  Lab Results  Component Value Date   PLT 228 04/24/2015    Medications  Scheduled Meds: . albumin human  12.5 g Intravenous Once  . feeding supplement (ENSURE ENLIVE)  237 mL Oral Q24H  . folic acid  1 mg Oral Daily  . furosemide  160 mg Oral TID  . heparin  5,000 Units Subcutaneous 3 times per day  . metolazone  10 mg Oral Daily  . multivitamin with minerals  1 tablet Oral Daily  . potassium chloride  40 mEq Oral Daily  . sodium chloride  3 mL Intravenous Q12H  . thiamine  100 mg Oral Daily   Or  . thiamine  100 mg Intravenous Daily   Continuous Infusions:  PRN Meds:.alum & mag hydroxide-simeth, calcium carbonate, diphenhydrAMINE, diphenhydrAMINE-zinc acetate, HYDROcodone-acetaminophen, ondansetron **OR** ondansetron (ZOFRAN) IV, senna-docusate  Antibiotics    Anti-infectives    None      Subjective:   Wesley Burns seen and examined today.  Patient continues to complain that his genitals are swollen. Patient was seen walking around, and states he is breathing better. Patient denies chest pain, abdominal pain, dizziness or headache.   Objective:   Filed Vitals:   04/23/15 0554 04/23/15 2048 04/24/15 0539 04/24/15 1000  BP: 158/99 107/74 127/83 107/51  Pulse: 86 92 82 65  Temp: 98.2 F (36.8 C) 97.5 F (36.4 C) 97.5 F (36.4 C)   TempSrc: Oral Oral Oral   Resp: 18 18 18 18   Height:      Weight: 81.693 kg (180 lb 1.6 oz)  81.483 kg (179 lb 10.2 oz)   SpO2: 98% 100% 100% 100%    Wt Readings from Last 3 Encounters:  04/24/15 81.483 kg (179 lb 10.2 oz)  10/07/13 70.761 kg (156 lb)  08/11/13 70.761 kg (156 lb)     Intake/Output Summary (Last 24 hours) at 04/24/15 1155 Last data filed at 04/24/15 1000  Gross per 24 hour  Intake    850 ml  Output    253 ml  Net    597 ml    Exam  General: Well developed,  no distress  HEENT: NCAT, mucous membranes moist.   Cardiovascular: S1 S2 auscultated, 2/6  SEM, Regular rate and rhythm.  Respiratory: Slightly diminished, however clear  Abdomen: Soft, nontender, nondistended, + bowel sounds, abdominal wall edema  Extremities: warm dry without cyanosis clubbing. ++pitting edema in LE B/L. Transmetatarsal amputation  Neuro: AAOx3, nonfocal  Data Review   Micro Results No results found for this or any previous visit (from the past 240 hour(s)).  Radiology Reports Dg Chest 2 View  04/20/2015   CLINICAL DATA:  Edema, shortness of breath, diarrhea.  EXAM: CHEST  2 VIEW  COMPARISON:  08/16/2013  FINDINGS: There are small to moderate bilateral pleural effusions with bibasilar opacities, right greater than left. The  cardiomediastinal contours are normal. Pulmonary vasculature is normal. No pneumothorax. No acute osseous abnormalities are seen.  IMPRESSION: Small to moderate bilateral pleural effusions and bibasilar opacities, right greater than left.   Electronically Signed   By: Jeb Levering M.D.   On: 04/20/2015 23:15   US Renal  04/21/2015   CLINICAL DATA:  Elevated creatinine.  Chronic kidney disease.  EXAM: RENAL / URINARY TRACT ULTRASOUND COMPLETE  COMPARISON:  None.  FINDINGS: Right Kidney:  Length: 12.9 cm.  No hydronephrosis or mass.  Increased echotexture.  Left Kidney:  Length: 12.6 cm.  No hydronephrosis or mass.  Increased echotexture.  Bladder:  Partially decompressed. Bladder wall appears thickened, likely to decompressed state.  IMPRESSION: Increased echotexture within the kidneys suggesting chronic medical renal disease. No hydronephrosis.   Electronically Signed   By: Rolm Baptise M.D.   On: 04/21/2015 21:38    CBC  Recent Labs Lab 04/20/15 1953 04/21/15 0438 04/22/15 0338 04/23/15 0540 04/24/15 0319  WBC 5.6 7.0 6.9 4.4 4.8  HGB 12.8* 11.4* 10.3* 11.5* 10.3*  HCT 34.7* 30.9* 28.7* 33.0* 29.8*  PLT 353 327 258 271 228  MCV 92.3 92.8 96.0 97.1 98.7  MCH 34.0 34.2* 34.4* 33.8 34.1*  MCHC 36.9* 36.9* 35.9 34.8 34.6  RDW  13.0 13.0 13.2 13.3 13.3    Chemistries   Recent Labs Lab 04/20/15 1953 04/21/15 0438 04/22/15 0338 04/23/15 0540 04/24/15 0319  NA 124* 124* 125* 128* 131*  K 2.7* 2.9* 3.6 4.1 4.6  CL 87* 88* 90* 90* 94*  CO2 28 28 28  32 29  GLUCOSE 93 72 75 93 113*  BUN 9 9 12 13 16   CREATININE 1.18 1.20 1.49* 1.63* 1.77*  CALCIUM 8.5* 8.1* 7.7* 7.9* 7.4*  MG  --   --  1.0*  --  1.0*  AST 41  --  37  --   --   ALT 14*  --  13*  --   --   ALKPHOS 157*  --  128*  --   --   BILITOT 0.6  --  0.7  --   --    ------------------------------------------------------------------------------------------------------------------ estimated creatinine clearance is 56.4 mL/min (by C-G formula based on Cr of 1.77). ------------------------------------------------------------------------------------------------------------------ No results for input(s): HGBA1C in the last 72 hours. ------------------------------------------------------------------------------------------------------------------ No results for input(s): CHOL, HDL, LDLCALC, TRIG, CHOLHDL, LDLDIRECT in the last 72 hours. ------------------------------------------------------------------------------------------------------------------ No results for input(s): TSH, T4TOTAL, T3FREE, THYROIDAB in the last 72 hours.  Invalid input(s): FREET3 ------------------------------------------------------------------------------------------------------------------  Recent Labs  04/21/15 1644  TIBC 182*  IRON 105    Coagulation profile No results for input(s): INR, PROTIME in the last 168 hours.  No results for input(s): DDIMER in the last 72 hours.  Cardiac Enzymes No results for input(s): CKMB, TROPONINI, MYOGLOBIN in the last 168 hours.  Invalid input(s): CK ------------------------------------------------------------------------------------------------------------------ Invalid input(s): POCBNP    Clemma Johnsen D.O. on 04/24/2015 at  11:55 AM  Between 7am to 7pm - Pager - [phone]  After 7pm go to [email or web address] - password [email or web address]  And look for the night coverage person covering for me after hours  [facility] Office  [phone]

## 2015-04-24 NOTE — Progress Notes (Signed)
[facility] ROUNDING NOTE   Subjective:   Interval History: diuresing well no complaitns  Objective:  Vital signs in last 24 hours:  Temp:  [97.5 F (36.4 C)] 97.5 F (36.4 C) (05/23 0539) Pulse Rate:  [65-92] 65 (05/23 1000) Resp:  [18] 18 (05/23 1000) BP: (107-127)/(51-83) 107/51 mmHg (05/23 1000) SpO2:  [100 %] 100 % (05/23 1000) Weight:  [81.483 kg (179 lb 10.2 oz)] 81.483 kg (179 lb 10.2 oz) (05/23 0539)  Weight change: -0.21 kg (-7.4 oz) Filed Weights   04/22/15 0625 04/23/15 0554 04/24/15 0539  Weight: 83.099 kg (183 lb 3.2 oz) 81.693 kg (180 lb 1.6 oz) 81.483 kg (179 lb 10.2 oz)    Intake/Output: I/O last 3 completed shifts: In: 840 [P.O.:840] Out: 927 [Urine:925; Stool:2]   Intake/Output this shift:  Total I/O In: 10 [P.O.:10] Out: 151 [Urine:150; Stool:1]  CVS- RRR RS- CTA ABD- BS present soft non-distended EXT- lower extremity edema   Basic Metabolic Panel:  Recent Labs Lab 04/20/15 1953 04/21/15 0438 04/22/15 0338 04/23/15 0540 04/24/15 0319  NA 124* 124* 125* 128* 131*  K 2.7* 2.9* 3.6 4.1 4.6  CL 87* 88* 90* 90* 94*  CO2 28 28 28  32 29  GLUCOSE 93 72 75 93 113*  BUN 9 9 12 13 16   CREATININE 1.18 1.20 1.49* 1.63* 1.77*  CALCIUM 8.5* 8.1* 7.7* 7.9* 7.4*  MG  --   --  1.0*  --  1.0*  PHOS  --   --  2.4* 1.8* 1.7*    Liver Function Tests:  Recent Labs Lab 04/20/15 1953 04/22/15 0338 04/23/15 0540 04/24/15 0319  AST 41 37  --   --   ALT 14* 13*  --   --   ALKPHOS 157* 128*  --   --   BILITOT 0.6 0.7  --   --   PROT 4.8* 4.4*  --   --   ALBUMIN 1.4* 1.3* 1.6* 1.4*   No results for input(s): LIPASE, AMYLASE in the last 168 hours. No results for input(s): AMMONIA in the last 168 hours.  CBC:  Recent Labs Lab 04/20/15 1953 04/21/15 0438 04/22/15 0338 04/23/15 0540 04/24/15 0319  WBC 5.6 7.0 6.9 4.4 4.8  HGB 12.8* 11.4* 10.3* 11.5* 10.3*  HCT 34.7* 30.9* 28.7* 33.0* 29.8*  MCV 92.3 92.8 96.0 97.1 98.7  PLT  353 327 258 271 228    Cardiac Enzymes:  Recent Labs Lab 04/21/15 1644  CKTOTAL 41*    BNP: Invalid input(s): POCBNP  CBG:  Recent Labs Lab 04/21/15 0434 04/22/15 0604 04/23/15 0558 04/23/15 0627 04/24/15 0606  GLUCAP 80 71 68 92 80    Microbiology: Results for orders placed or performed in visit on 08/11/13  CULTURE, URINE COMPREHENSIVE     Status: None   Collection Time: 08/11/13  5:12 PM  Result Value Ref Range Status   Culture ENTEROCOCCUS SPECIES  Final   Colony Count 30,000 COLONIES/ML  Final   Organism ID, Bacteria ENTEROCOCCUS SPECIES  Final      Susceptibility   Enterococcus species -  (no method available)    AMPICILLIN <=2 Sensitive     LEVOFLOXACIN 2 Sensitive     NITROFURANTOIN <=16 Sensitive     VANCOMYCIN 1 Sensitive     TETRACYCLINE >=16 Resistant     Coagulation Studies: No results for input(s): LABPROT, INR in the last 72 hours.  Urinalysis: No results for input(s): COLORURINE, LABSPEC, PHURINE, GLUCOSEU, HGBUR, BILIRUBINUR, KETONESUR, PROTEINUR, UROBILINOGEN, NITRITE, LEUKOCYTESUR  in the last 72 hours.  Invalid input(s): APPERANCEUR    Imaging: No results found.   Medications:     . albumin human  12.5 g Intravenous Once  . feeding supplement (ENSURE ENLIVE)  237 mL Oral Q24H  . folic acid  1 mg Oral Daily  . furosemide  160 mg Oral TID  . heparin  5,000 Units Subcutaneous 3 times per day  . magnesium sulfate 1 - 4 g bolus IVPB  2 g Intravenous Once  . metolazone  10 mg Oral Daily  . multivitamin with minerals  1 tablet Oral Daily  . potassium chloride  40 mEq Oral Daily  . sodium chloride  3 mL Intravenous Q12H  . thiamine  100 mg Oral Daily   Or  . thiamine  100 mg Intravenous Daily   alum & mag hydroxide-simeth, calcium carbonate, diphenhydrAMINE, diphenhydrAMINE-zinc acetate, HYDROcodone-acetaminophen, ondansetron **OR** ondansetron (ZOFRAN) IV, senna-docusate  Assessment/ Plan:   Anasarca  Thought to be  multifactorial  Renal and liver ( alcohol abuse )   Diuresing well    Nothing really to add except to continue with current plans   Will sign off if all right with primary team   LOS: 3 Shaden Lacher W @TODAY @10 :29 AM

## 2015-04-25 ENCOUNTER — Inpatient Hospital Stay (HOSPITAL_COMMUNITY): Payer: 59

## 2015-04-25 LAB — UIFE/LIGHT CHAINS/TP QN, 24-HR UR
% BETA, Urine: 11.9 %
ALPHA 1 URINE: 5.5 %
Albumin, U: 75.4 %
Alpha 2, Urine: 3.7 %
Free Kappa/Lambda Ratio: 1.71 — ABNORMAL LOW (ref 2.04–10.37)
Free Lambda Lt Chains,Ur: 3.97 mg/L (ref 0.24–6.66)
Free Lt Chn Excr Rate: 6.8 mg/L (ref 1.35–24.19)
GAMMA GLOBULIN URINE: 3.6 %
TOTAL PROTEIN, URINE-UPE24: 74 mg/dL

## 2015-04-25 LAB — BASIC METABOLIC PANEL
Anion gap: 9 (ref 5–15)
BUN: 19 mg/dL (ref 6–20)
CHLORIDE: 93 mmol/L — AB (ref 101–111)
CO2: 28 mmol/L (ref 22–32)
Calcium: 7.4 mg/dL — ABNORMAL LOW (ref 8.9–10.3)
Creatinine, Ser: 1.64 mg/dL — ABNORMAL HIGH (ref 0.61–1.24)
GFR calc non Af Amer: 47 mL/min — ABNORMAL LOW (ref >60)
GFR, EST AFRICAN AMERICAN: 55 mL/min — AB (ref >60)
Glucose, Bld: 101 mg/dL — ABNORMAL HIGH (ref 65–99)
POTASSIUM: 4.7 mmol/L (ref 3.5–5.1)
Sodium: 130 mmol/L — ABNORMAL LOW (ref 135–145)

## 2015-04-25 LAB — PHOSPHORUS: PHOSPHORUS: 2.5 mg/dL (ref 2.5–4.6)

## 2015-04-25 LAB — MAGNESIUM: Magnesium: 1.4 mg/dL — ABNORMAL LOW (ref 1.7–2.4)

## 2015-04-25 NOTE — Progress Notes (Signed)
[facility]                                                                              Patient Demographics  Wesley Burns, is a 49 y.o. male, DOB - [birth year], MGN:[id number]  Admit date - 04/20/2015   Admitting Physician Sid Falcon, MD  Outpatient Primary MD for the patient is No PCP Per Patient  LOS - 4   Chief Complaint  Patient presents with  . Edema  . Shortness of Breath  . Diarrhea      HPI on 04/21/2015 by Dr. Gilles Chiquito Wesley Burns is a 49yo man with PMH of DM, HTN and nephrotic range proteinuria. He presents today for 3 week history of swelling. He reports that about 3 weeks ago, he was in his normal state of health, when he saw a commercial for lysol spray that disinfects fabrics. He took his lysol spray out and sprayed his entire bed. Before completely drying, he lay down to go to sleep and woke up with a rash. Since that time he has been slowly swelling. The swelling has now reached his scrotum and abdomen and he became scared, so he presented to the ED. Further symptoms include a 30 pound weight gain, pain with walking, stretching feeling of the skin, SOB, difficulty with urination due to swollen penis, nausea, heartburn. On chart review, Wesley Burns does hold the diagnosis of DM and has had a 24 hour urine protein which was elevated in 2014. He was previously treated for his HTN and DM, but appears to have been lost to follow up. When asked, Wesley Burns has little insight in to these previous diagnoses. He also admits to drinking about 2-3 quarts of liquor in a week. His friend in the room notes that he drinks mostly and forgets to eat, so has a very poor diet. He also reports that he is a vegetarian, mostly.  Interim history Nephrology consulted. Patient strongly and metolazone. Creatinine has bumped however per nephrology continue to monitor. Continue to monitor intake and output. Patient continues to complain of frontal slowing. Scrotal ultrasound  pending. Suspect patient will be able to go home within the next 24-48 hours. Psychiatry also consulted for questionable depression and alcoholism.  Assessment & Plan   Anasarca with h/o proteinuria -Antihypertensives held initially -Possibly multifactorial, renal vs liver vs alcohol abuse -Nephrology consultation appreciated - continue Lasix $RemoveBefore'160mg'tZAInwSCeoiAn$  PO TID and metolazone -Continue to monitor intake/output, daily weights, UO 1363mL over past 24 hours; -5lbs since admission -Echocardiogram: EF 91-69%, grade 1 diastolic dysfunction -Patient states that he was seeing someone however was lost to follow-up and has poor oral intake -Renal utrasound: increased echotexture, suggesting chronic medical renal disease, no hydronephrosis -Nutrition consulted -Mpo/pr3 ANCA and Anti-ds DNA ab negative -C3 and C4 complement low, ASO normal -HIV and Hepatitis B/C, nonreactive -LFTs: AST/ALT and bilirubin normal, Alk phos lower than previously in 2014 -Instructed nursing and patient to keep genitalia elevated- will obtain scrotal US as patient continues to complain of pain  Normocytic Anemia -No evidence of bleeding, Hemoglobin remains stable 10.3 -Anemia panel: shows adequate iron -continue to monitor CBC  AKI/CKD -Per Renal US, patient has chronic medical renal  disease -Cr 1.64 today, likely secondary to diurectics  -Continue to monitor BMP -Spoke with nephrology, patient is to continue diuretics for now, he may need dialysis in the future -Nephrology signed off, and will see as an outpatient  Essential hypertension -Monitor closely -Continue lasix and metolazone  DM 2 -Hemoglobin A1c 4.7 -Continue insulin sliding scale CBG monitoring  ETOH abuse/Depression -CIWA protocol -Encourage PO intake -Spoke with friend who states he drinks "a lot, he's a heavy drinker" -Psychiatry consulted and recommended outpatient follow up  Hypokalemia/Hypomagnesemia/Hypophosphatemia -Resolved, Potassium  4.7 -Magnesium improved slightly to 1.4  -Phosphorus 2.5 (resolved) -Continue to monitor BMP  Hypervolemic Hyponatremia, moderate, likely chronic, asymptomatic -Continue fluid restriction, 1200 mL fluid restriction daily -Sodium 130 -Continue to monitor BMP  Code Status: Full  Family Communication: None at bedside  Disposition Plan: Admitted, continue diuresis.  Scrotal US pending.   Time Spent in minutes   30 minutes  Procedures  Echocardiogram  Renal US  Consults   Nephrology Psychiatry  DVT Prophylaxis  heparin  Lab Results  Component Value Date   PLT 228 04/24/2015    Medications  Scheduled Meds: . feeding supplement (ENSURE ENLIVE)  237 mL Oral Q24H  . folic acid  1 mg Oral Daily  . furosemide  160 mg Oral TID  . heparin  5,000 Units Subcutaneous 3 times per day  . metolazone  10 mg Oral Daily  . multivitamin with minerals  1 tablet Oral Daily  . potassium chloride  40 mEq Oral Daily  . sodium chloride  3 mL Intravenous Q12H  . thiamine  100 mg Oral Daily   Or  . thiamine  100 mg Intravenous Daily   Continuous Infusions:  PRN Meds:.alum & mag hydroxide-simeth, calcium carbonate, diphenhydrAMINE, diphenhydrAMINE-zinc acetate, HYDROcodone-acetaminophen, LORazepam, ondansetron **OR** ondansetron (ZOFRAN) IV, senna-docusate  Antibiotics    Anti-infectives    None      Subjective:   Wesley Burns seen and examined today.  Patient continues to complain about his food and how he is constantly interactive, he is trying. He complained about how his food was called. Patient also continues to complain about how his genitals are swollen, he feels they are "going to pop". Patient denies chest pain, abdominal pain, dizziness or headache.   Objective:   Filed Vitals:   04/24/15 1427 04/24/15 2114 04/25/15 0515 04/25/15 0945  BP: 124/101 147/94 133/87 134/82  Pulse: 100 100 102 99  Temp: 98 F (36.7 C) 98.1 F (36.7 C)    TempSrc: Oral Oral Oral   Resp:  $Remo'19 20 20 20  'nzSTo$ Height:      Weight:   81.7 kg (180 lb 1.9 oz)   SpO2: 100% 100% 100% 100%    Wt Readings from Last 3 Encounters:  04/25/15 81.7 kg (180 lb 1.9 oz)  10/07/13 70.761 kg (156 lb)  08/11/13 70.761 kg (156 lb)     Intake/Output Summary (Last 24 hours) at 04/25/15 1147 Last data filed at 04/25/15 0945  Gross per 24 hour  Intake 1708.33 ml  Output   1177 ml  Net 531.33 ml    Exam  General: Well developed,  no distress  HEENT: NCAT, mucous membranes moist.   Cardiovascular: S1 S2 auscultated, 2/6 SEM, Regular rate and rhythm.  Abdomen: Soft, nontender, nondistended, abdominal wall edema  Extremities: warm dry without cyanosis clubbing. ++pitting edema in LE B/L. Transmetatarsal amputation  Neuro: AAOx3, nonfocal  Data Review   Micro Results No results found for this or any previous  visit (from the past 240 hour(s)).  Radiology Reports Dg Chest 2 View  04/20/2015   CLINICAL DATA:  Edema, shortness of breath, diarrhea.  EXAM: CHEST  2 VIEW  COMPARISON:  08/16/2013  FINDINGS: There are small to moderate bilateral pleural effusions with bibasilar opacities, right greater than left. The cardiomediastinal contours are normal. Pulmonary vasculature is normal. No pneumothorax. No acute osseous abnormalities are seen.  IMPRESSION: Small to moderate bilateral pleural effusions and bibasilar opacities, right greater than left.   Electronically Signed   By: Jeb Levering M.D.   On: 04/20/2015 23:15   US Renal  04/21/2015   CLINICAL DATA:  Elevated creatinine.  Chronic kidney disease.  EXAM: RENAL / URINARY TRACT ULTRASOUND COMPLETE  COMPARISON:  None.  FINDINGS: Right Kidney:  Length: 12.9 cm.  No hydronephrosis or mass.  Increased echotexture.  Left Kidney:  Length: 12.6 cm.  No hydronephrosis or mass.  Increased echotexture.  Bladder:  Partially decompressed. Bladder wall appears thickened, likely to decompressed state.  IMPRESSION: Increased echotexture within the kidneys  suggesting chronic medical renal disease. No hydronephrosis.   Electronically Signed   By: Rolm Baptise M.D.   On: 04/21/2015 21:38    CBC  Recent Labs Lab 04/20/15 1953 04/21/15 0438 04/22/15 0338 04/23/15 0540 04/24/15 0319  WBC 5.6 7.0 6.9 4.4 4.8  HGB 12.8* 11.4* 10.3* 11.5* 10.3*  HCT 34.7* 30.9* 28.7* 33.0* 29.8*  PLT 353 327 258 271 228  MCV 92.3 92.8 96.0 97.1 98.7  MCH 34.0 34.2* 34.4* 33.8 34.1*  MCHC 36.9* 36.9* 35.9 34.8 34.6  RDW 13.0 13.0 13.2 13.3 13.3    Chemistries   Recent Labs Lab 04/20/15 1953 04/21/15 0438 04/22/15 0338 04/23/15 0540 04/24/15 0319 04/25/15 0340  NA 124* 124* 125* 128* 131* 130*  K 2.7* 2.9* 3.6 4.1 4.6 4.7  CL 87* 88* 90* 90* 94* 93*  CO2 $Re'28 28 28 'xrF$ 32 29 28  GLUCOSE 93 72 75 93 113* 101*  BUN $Re'9 9 12 13 16 19  'FQV$ CREATININE 1.18 1.20 1.49* 1.63* 1.77* 1.64*  CALCIUM 8.5* 8.1* 7.7* 7.9* 7.4* 7.4*  MG  --   --  1.0*  --  1.0* 1.4*  AST 41  --  37  --   --   --   ALT 14*  --  13*  --   --   --   ALKPHOS 157*  --  128*  --   --   --   BILITOT 0.6  --  0.7  --   --   --    ------------------------------------------------------------------------------------------------------------------ estimated creatinine clearance is 60.9 mL/min (by C-G formula based on Cr of 1.64). ------------------------------------------------------------------------------------------------------------------ No results for input(s): HGBA1C in the last 72 hours. ------------------------------------------------------------------------------------------------------------------ No results for input(s): CHOL, HDL, LDLCALC, TRIG, CHOLHDL, LDLDIRECT in the last 72 hours. ------------------------------------------------------------------------------------------------------------------ No results for input(s): TSH, T4TOTAL, T3FREE, THYROIDAB in the last 72 hours.  Invalid input(s):  FREET3 ------------------------------------------------------------------------------------------------------------------ No results for input(s): VITAMINB12, FOLATE, FERRITIN, TIBC, IRON, RETICCTPCT in the last 72 hours.  Coagulation profile No results for input(s): INR, PROTIME in the last 168 hours.  No results for input(s): DDIMER in the last 72 hours.  Cardiac Enzymes No results for input(s): CKMB, TROPONINI, MYOGLOBIN in the last 168 hours.  Invalid input(s): CK ------------------------------------------------------------------------------------------------------------------ Invalid input(s): POCBNP    Ashlea Dusing D.O. on 04/25/2015 at 11:47 AM  Between 7am to 7pm - Pager - [phone]  After 7pm go to [email or web address] - password [email or web address]  And look  for the night coverage person covering for me after hours  [facility] Office  [phone]

## 2015-04-26 LAB — BASIC METABOLIC PANEL
Anion gap: 8 (ref 5–15)
BUN: 23 mg/dL — ABNORMAL HIGH (ref 6–20)
CHLORIDE: 96 mmol/L — AB (ref 101–111)
CO2: 28 mmol/L (ref 22–32)
CREATININE: 1.58 mg/dL — AB (ref 0.61–1.24)
Calcium: 7.5 mg/dL — ABNORMAL LOW (ref 8.9–10.3)
GFR calc Af Amer: 57 mL/min — ABNORMAL LOW (ref >60)
GFR, EST NON AFRICAN AMERICAN: 49 mL/min — AB (ref >60)
Glucose, Bld: 79 mg/dL (ref 65–99)
POTASSIUM: 5.2 mmol/L — AB (ref 3.5–5.1)
Sodium: 132 mmol/L — ABNORMAL LOW (ref 135–145)

## 2015-04-26 LAB — CBC
HCT: 29.1 % — ABNORMAL LOW (ref 39.0–52.0)
Hemoglobin: 10 g/dL — ABNORMAL LOW (ref 13.0–17.0)
MCH: 34.1 pg — ABNORMAL HIGH (ref 26.0–34.0)
MCHC: 34.4 g/dL (ref 30.0–36.0)
MCV: 99.3 fL (ref 78.0–100.0)
PLATELETS: 254 10*3/uL (ref 150–400)
RBC: 2.93 MIL/uL — ABNORMAL LOW (ref 4.22–5.81)
RDW: 13.8 % (ref 11.5–15.5)
WBC: 5.7 10*3/uL (ref 4.0–10.5)

## 2015-04-26 LAB — GLUCOSE, CAPILLARY: GLUCOSE-CAPILLARY: 90 mg/dL (ref 65–99)

## 2015-04-26 MED ORDER — SPIRONOLACTONE 100 MG PO TABS
200.0000 mg | ORAL_TABLET | Freq: Every day | ORAL | Status: DC
  Administered 2015-04-26: 200 mg via ORAL
  Filled 2015-04-26 (×2): qty 2

## 2015-04-26 MED ORDER — FUROSEMIDE 80 MG PO TABS
80.0000 mg | ORAL_TABLET | Freq: Every day | ORAL | Status: DC
  Filled 2015-04-26: qty 1

## 2015-04-26 MED ORDER — LORAZEPAM 1 MG PO TABS
2.0000 mg | ORAL_TABLET | Freq: Once | ORAL | Status: AC
  Administered 2015-04-26: 2 mg via ORAL
  Filled 2015-04-26: qty 2

## 2015-04-26 NOTE — Progress Notes (Signed)
RN walked into the patient's room and found the patient on the bathroom floor. RN asked the patient what happened and patient stated "I was finishing up using the bathroom and I slowly slid down to the floor, I'm okay, and its no big deal." RN asked the patient if he lost  consciousness or hit his head or hit anything else and the patient stated "no, I'm okay." RN asked the patient why he went to the bathroom instead of using the bedside commode and calling for help and the patient stated "my walker was closer to the bathroom instead of the bedside commode." RN asked patient why he didn't have his yellow socks on and patient stated "they don't fit my feet." RN educated the patient on the importance of wearing his yellow socks. Yellow socks were put on the patient. Yellow arm band has been on the patient. Patient's VSS. MD paged and came and saw the patient. Pt stated multiple times "It's no big deal, I'm okay."   Pt refused for his family to be called. No additional tests were ordered by the physician. Pt educated on the importance of using his call bell to call for help and not trying to get out of the bed alone. Bed alarm has been turned on. Side rails up x3. Will continue to monitor.

## 2015-04-26 NOTE — Progress Notes (Signed)
[facility]                                                                              Subj:- Accidental fall to floor-non witnessed.  No injury to head or neck No bleeding States "i slipped"   Didn't have walker Defensive about q's realted to amount of drinking-concedes "I have been drinking ETOH since college"  "I drink like anyone else"   Chief Complaint  Patient presents with  . Edema  . Shortness of Breath  . Diarrhea       49yo man with PMH of DM, HTN and nephrotic range proteinuria. admit 5/20 c 3 week history of swelling.used lysol on bed,woke up with a rash. Since that time he has been slowly swelling. The swelling has now reached his scrotum and abdomen.   Further symptoms include a 30 pound weight gain, pain with walking, stretching feeling of the skin, SOB, difficulty with urination due to swollen penis, nausea, heartburn. On chart review, Mr. Upperman does hold the diagnosis of DM and has had a 24 hour urine protein which was elevated in 2014. He was previously treated for his HTN and DM, but appears to have been lost to follow up.s. He also admits to drinking about 2-3 quarts of liquor in a week.  Patient admitted and diuresed aggressively under guidance from Nephrology who have recommended OP follow up HE has poor poor insight into his issues    Assessment & Plan   Anasarca with h/o proteinuria -Antihypertensives held initially -Possibly multifactorial, renal vs liver vs alcohol abuse -Nephrology consultation appreciated - continue Lasix 145m PO TID -Metolazone d/c 04/26/15--start aldactone: lasix 100:40 ratio -will need OP Gi follow-up interval -Continue to monitor intake/output, daily weights, UO 13268mover past 24 hours; -5lbs since admission -Echocardiogram: EF 5530-07%grade 1 diastolic dysfunction -Patient states that he was seeing someone however was lost to follow-up and has poor oral intake -Renal utrasound: increased echotexture,  suggesting chronic medical renal disease, no hydronephrosis -Nutrition consulted -Mpo/pr3 ANCA and Anti-ds DNA ab negative -C3 and C4 complement low, ASO normal -HIV and Hepatitis B/C, nonreactive -LFTs: AST/ALT and bilirubin normal, Alk phos lower than previously in 2014 - scrotal US[place]/24=diffuse swelling/cellulitis  Normocytic Anemia -No evidence of bleeding, Hemoglobin remains stable ~ 10 -Anemia panel: shows adequate iron -continue to monitor CBC  AKI/CKD -Per Renal USKoreapatient has chronic medical renal disease -Cr 1.64 today, li -Continue to monitor BMP -dr. MiRica Recordsith nephrology, patient is to continue diuretics for now, he may need dialysis in the futureNephrology signed off, and will see as an outpatient -I have adjusted his diuretics 5/25 to ensure he is being adequately Rx for anasarca from a liver standpoint  Essential hypertension -Monitor closely  [facility] -Hemoglobin A1c 4.7 -Continue insulin sliding scale CBG monitoring  ETOH abuse/Depression -discontinue CIWA protocol 5/25 -Encourage PO intake -Dr. MiRica Recordsith friend who states he drinks "a lot, he's a heavy drinker" -Psychiatry consulted and recommended outpatient follow up  Hypokalemia/Hypomagnesemia/Hypophosphatemia -Resolving -mild ? K 5/25, d/c Kdur  Hypervolemic Hyponatremia, moderate, likely 2/2 to anasarca/3rd spacing from cirrhosis -Continue fluid restriction, 1200  mL fluid restriction daily -Sodium 130 -Continue to monitor BMP  Code Status: Full  Family Communication: None at bedside  Disposition Plan: Admitted, continue diuresis Likely d/c 24-48 hrs  Time Spent in minutes   20 minutes  Procedures  Echocardiogram  Renal US  Consults   Nephrology Psychiatry  DVT Prophylaxis  heparin  Lab Results  Component Value Date   PLT 254 04/26/2015    Medications  Scheduled Meds: . feeding supplement (ENSURE ENLIVE)  237 mL Oral  Q24H  . folic acid  1 mg Oral Daily  . [START ON 04/27/2015] furosemide  80 mg Oral Daily  . heparin  5,000 Units Subcutaneous 3 times per day  . metolazone  10 mg Oral Daily  . multivitamin with minerals  1 tablet Oral Daily  . sodium chloride  3 mL Intravenous Q12H  . spironolactone  200 mg Oral Daily  . thiamine  100 mg Oral Daily   Continuous Infusions:  PRN Meds:.alum & mag hydroxide-simeth, calcium carbonate, diphenhydrAMINE, diphenhydrAMINE-zinc acetate, HYDROcodone-acetaminophen, LORazepam, ondansetron **OR** ondansetron (ZOFRAN) IV, senna-docusate  Antibiotics    Anti-infectives    None      Objective:   Filed Vitals:   04/25/15 2041 04/26/15 0500 04/26/15 0502 04/26/15 1200  BP: 174/89  160/92 153/90  Pulse: 104  100 109  Temp: 98.6 F (37 C)  98.6 F (37 C) 98.1 F (36.7 C)  TempSrc: Oral  Oral Oral  Resp: 18  16 20   Height:      Weight:  80.786 kg (178 lb 1.6 oz)    SpO2: 100%  100% 100%    Wt Readings from Last 3 Encounters:  04/26/15 80.786 kg (178 lb 1.6 oz)  10/07/13 70.761 kg (156 lb)  08/11/13 70.761 kg (156 lb)     Intake/Output Summary (Last 24 hours) at 04/26/15 1254 Last data filed at 04/26/15 0948  Gross per 24 hour  Intake   1240 ml  Output   2015 ml  Net   -775 ml    Exam  General: Well developed,  no distress  HEENT: NCAT, mucous membranes moist.   Cardiovascular: S1 S2 auscultated, 2/6 SEM, Regular rate and rhythm.  Abdomen: Soft, nontender, nondistended, abdominal wall edema-no shifting dullness  Extremities: warm dry without cyanosis clubbing. Grade 3+ pitting edema in LE B/L. Transmetatarsal amputation appears clean  Neuro: AAOx3, nonfocal  Data Review   Micro Results No results found for this or any previous visit (from the past 240 hour(s)).  Radiology Reports Dg Chest 2 View  04/20/2015   CLINICAL DATA:  Edema, shortness of breath, diarrhea.  EXAM: CHEST  2 VIEW  COMPARISON:  08/16/2013  FINDINGS: There are small  to moderate bilateral pleural effusions with bibasilar opacities, right greater than left. The cardiomediastinal contours are normal. Pulmonary vasculature is normal. No pneumothorax. No acute osseous abnormalities are seen.  IMPRESSION: Small to moderate bilateral pleural effusions and bibasilar opacities, right greater than left.   Electronically Signed   By: Jeb Levering M.D.   On: 04/20/2015 23:15   US Scrotum  04/25/2015   CLINICAL DATA:  Scrotal swelling  EXAM: SCROTAL ULTRASOUND  DOPPLER ULTRASOUND OF THE TESTICLES  TECHNIQUE: Complete ultrasound examination of the testicles, epididymis, and other scrotal structures was performed. Color and spectral Doppler ultrasound were also utilized to evaluate blood flow to the testicles.  COMPARISON:  None.  FINDINGS: Right testicle  Measurements: 5.0 x 2.6 x 2.5 cm. No mass or microlithiasis visualized.  Left testicle  Measurements: 3.8 x 3.1 x 2.6 cm. No mass or microlithiasis visualized.  Right epididymis:  Obscured by overlying soft tissue edema  Left epididymis:  Normal in size and appearance.  Hydrocele:  None visualized.  Varicocele:  None visualized.  There is diffuse marked scrotal edema and skin thickening.  Pulsed Doppler interrogation of both testes demonstrates normal low resistance arterial and venous waveforms bilaterally.  IMPRESSION:  No intratesticular mass or sonographic evidence for testicular torsion.  Marked diffuse scrotal/skin thickening which could be due to hypoproteinemia/ soft tissue edema nonspecifically or cellulitis.   Electronically Signed   By: Conchita Paris M.D.   On: 04/25/2015 19:20   US Renal  04/21/2015   CLINICAL DATA:  Elevated creatinine.  Chronic kidney disease.  EXAM: RENAL / URINARY TRACT ULTRASOUND COMPLETE  COMPARISON:  None.  FINDINGS: Right Kidney:  Length: 12.9 cm.  No hydronephrosis or mass.  Increased echotexture.  Left Kidney:  Length: 12.6 cm.  No hydronephrosis or mass.  Increased echotexture.  Bladder:   Partially decompressed. Bladder wall appears thickened, likely to decompressed state.  IMPRESSION: Increased echotexture within the kidneys suggesting chronic medical renal disease. No hydronephrosis.   Electronically Signed   By: Rolm Baptise M.D.   On: 04/21/2015 21:38   [place] Art/ven Flow Abd Pelv Doppler  04/25/2015   CLINICAL DATA:  Scrotal swelling  EXAM: SCROTAL ULTRASOUND  DOPPLER ULTRASOUND OF THE TESTICLES  TECHNIQUE: Complete ultrasound examination of the testicles, epididymis, and other scrotal structures was performed. Color and spectral Doppler ultrasound were also utilized to evaluate blood flow to the testicles.  COMPARISON:  None.  FINDINGS: Right testicle  Measurements: 5.0 x 2.6 x 2.5 cm. No mass or microlithiasis visualized.  Left testicle  Measurements: 3.8 x 3.1 x 2.6 cm. No mass or microlithiasis visualized.  Right epididymis:  Obscured by overlying soft tissue edema  Left epididymis:  Normal in size and appearance.  Hydrocele:  None visualized.  Varicocele:  None visualized.  There is diffuse marked scrotal edema and skin thickening.  Pulsed Doppler interrogation of both testes demonstrates normal low resistance arterial and venous waveforms bilaterally.  IMPRESSION:  No intratesticular mass or sonographic evidence for testicular torsion.  Marked diffuse scrotal/skin thickening which could be due to hypoproteinemia/ soft tissue edema nonspecifically or cellulitis.   Electronically Signed   By: Conchita Paris M.D.   On: 04/25/2015 19:20    CBC  Recent Labs Lab 04/21/15 0438 04/22/15 0338 04/23/15 0540 04/24/15 0319 04/26/15 0428  WBC 7.0 6.9 4.4 4.8 5.7  HGB 11.4* 10.3* 11.5* 10.3* 10.0*  HCT 30.9* 28.7* 33.0* 29.8* 29.1*  PLT 327 258 271 228 254  MCV 92.8 96.0 97.1 98.7 99.3  MCH 34.2* 34.4* 33.8 34.1* 34.1*  MCHC 36.9* 35.9 34.8 34.6 34.4  RDW 13.0 13.2 13.3 13.3 13.8    Chemistries   Recent Labs Lab 04/20/15 1953  04/22/15 0338 04/23/15 0540 04/24/15 0319  04/25/15 0340 04/26/15 0428  NA 124*  < > 125* 128* 131* 130* 132*  K 2.7*  < > 3.6 4.1 4.6 4.7 5.2*  CL 87*  < > 90* 90* 94* 93* 96*  CO2 28  < > 28 32 29 28 28   GLUCOSE 93  < > 75 93 113* 101* 79  BUN 9  < > 12 13 16 19  23*  CREATININE 1.18  < > 1.49* 1.63* 1.77* 1.64* 1.58*  CALCIUM 8.5*  < > 7.7* 7.9* 7.4* 7.4* 7.5*  MG  --   --  1.0*  --  1.0* 1.4*  --   AST 41  --  37  --   --   --   --   ALT 14*  --  13*  --   --   --   --   ALKPHOS 157*  --  128*  --   --   --   --   BILITOT 0.6  --  0.7  --   --   --   --   < > = values in this interval not displayed. ------------------------------------------------------------------------------------------------------------------ estimated creatinine clearance is 63.2 mL/min (by C-G formula based on Cr of 1.58). ------------------------------------------------------------------------------------------------------------------ No results for input(s): HGBA1C in the last 72 hours. ------------------------------------------------------------------------------------------------------------------ No results for input(s): CHOL, HDL, LDLCALC, TRIG, CHOLHDL, LDLDIRECT in the last 72 hours. ------------------------------------------------------------------------------------------------------------------ No results for input(s): TSH, T4TOTAL, T3FREE, THYROIDAB in the last 72 hours.  Invalid input(s): FREET3 ------------------------------------------------------------------------------------------------------------------ No results for input(s): VITAMINB12, FOLATE, FERRITIN, TIBC, IRON, RETICCTPCT in the last 72 hours.  Coagulation profile No results for input(s): INR, PROTIME in the last 168 hours.  No results for input(s): DDIMER in the last 72 hours.  Cardiac Enzymes No results for input(s): CKMB, TROPONINI, MYOGLOBIN in the last 168 hours.  Invalid input(s):  CK ------------------------------------------------------------------------------------------------------------------ Invalid input(s): POCBNP    Raunak Antuna, [place] D.O. on 04/26/2015 at 12:54 PM  Between 7am to 7pm - Pager - [phone]  After 7pm go to [email or web address] - password [email or web address]  And look for the night coverage person covering for me after hours  [facility] Office  [phone]

## 2015-04-27 ENCOUNTER — Inpatient Hospital Stay (HOSPITAL_COMMUNITY): Payer: 59

## 2015-04-27 DIAGNOSIS — E1122 Type 2 diabetes mellitus with diabetic chronic kidney disease: Secondary | ICD-10-CM

## 2015-04-27 DIAGNOSIS — R809 Proteinuria, unspecified: Secondary | ICD-10-CM

## 2015-04-27 DIAGNOSIS — N184 Chronic kidney disease, stage 4 (severe): Secondary | ICD-10-CM

## 2015-04-27 DIAGNOSIS — K7031 Alcoholic cirrhosis of liver with ascites: Principal | ICD-10-CM

## 2015-04-27 DIAGNOSIS — Z992 Dependence on renal dialysis: Secondary | ICD-10-CM

## 2015-04-27 DIAGNOSIS — N186 End stage renal disease: Secondary | ICD-10-CM | POA: Diagnosis present

## 2015-04-27 LAB — CBC WITH DIFFERENTIAL/PLATELET
Basophils Absolute: 0.1 10*3/uL (ref 0.0–0.1)
Basophils Relative: 1 % (ref 0–1)
Eosinophils Absolute: 0.1 10*3/uL (ref 0.0–0.7)
Eosinophils Relative: 3 % (ref 0–5)
HCT: 27.5 % — ABNORMAL LOW (ref 39.0–52.0)
HEMOGLOBIN: 9.5 g/dL — AB (ref 13.0–17.0)
LYMPHS PCT: 26 % (ref 12–46)
Lymphs Abs: 1.3 10*3/uL (ref 0.7–4.0)
MCH: 34.2 pg — AB (ref 26.0–34.0)
MCHC: 34.5 g/dL (ref 30.0–36.0)
MCV: 98.9 fL (ref 78.0–100.0)
Monocytes Absolute: 0.7 10*3/uL (ref 0.1–1.0)
Monocytes Relative: 14 % — ABNORMAL HIGH (ref 3–12)
NEUTROS PCT: 56 % (ref 43–77)
Neutro Abs: 2.8 10*3/uL (ref 1.7–7.7)
Platelets: 241 10*3/uL (ref 150–400)
RBC: 2.78 MIL/uL — ABNORMAL LOW (ref 4.22–5.81)
RDW: 13.7 % (ref 11.5–15.5)
WBC: 5 10*3/uL (ref 4.0–10.5)

## 2015-04-27 LAB — COMPREHENSIVE METABOLIC PANEL
ALK PHOS: 276 U/L — AB (ref 38–126)
ALT: 15 U/L — ABNORMAL LOW (ref 17–63)
AST: 61 U/L — ABNORMAL HIGH (ref 15–41)
Albumin: 1.5 g/dL — ABNORMAL LOW (ref 3.5–5.0)
Anion gap: 9 (ref 5–15)
BUN: 28 mg/dL — ABNORMAL HIGH (ref 6–20)
CALCIUM: 7.4 mg/dL — AB (ref 8.9–10.3)
CO2: 25 mmol/L (ref 22–32)
Chloride: 98 mmol/L — ABNORMAL LOW (ref 101–111)
Creatinine, Ser: 1.62 mg/dL — ABNORMAL HIGH (ref 0.61–1.24)
GFR calc Af Amer: 56 mL/min — ABNORMAL LOW (ref >60)
GFR calc non Af Amer: 48 mL/min — ABNORMAL LOW (ref >60)
GLUCOSE: 65 mg/dL (ref 65–99)
Potassium: 4.9 mmol/L (ref 3.5–5.1)
SODIUM: 132 mmol/L — AB (ref 135–145)
TOTAL PROTEIN: 4.8 g/dL — AB (ref 6.5–8.1)
Total Bilirubin: 0.4 mg/dL (ref 0.3–1.2)

## 2015-04-27 LAB — PROTIME-INR
INR: 0.95 (ref 0.00–1.49)
Prothrombin Time: 12.9 seconds (ref 11.6–15.2)

## 2015-04-27 LAB — GLUCOSE, CAPILLARY: Glucose-Capillary: 166 mg/dL — ABNORMAL HIGH (ref 65–99)

## 2015-04-27 MED ORDER — FUROSEMIDE 40 MG PO TABS
60.0000 mg | ORAL_TABLET | Freq: Every day | ORAL | Status: DC
  Administered 2015-04-28: 60 mg via ORAL
  Filled 2015-04-27 (×2): qty 1

## 2015-04-27 MED ORDER — SPIRONOLACTONE 50 MG PO TABS
150.0000 mg | ORAL_TABLET | Freq: Every day | ORAL | Status: DC
  Administered 2015-04-27 – 2015-04-28 (×2): 150 mg via ORAL
  Filled 2015-04-27 (×2): qty 1

## 2015-04-27 MED ORDER — LORAZEPAM 1 MG PO TABS
2.0000 mg | ORAL_TABLET | Freq: Every evening | ORAL | Status: DC | PRN
  Administered 2015-04-27 – 2015-05-02 (×6): 2 mg via ORAL
  Filled 2015-04-27 (×7): qty 2

## 2015-04-27 MED ORDER — HYDROCODONE-ACETAMINOPHEN 5-325 MG PO TABS
1.0000 | ORAL_TABLET | Freq: Four times a day (QID) | ORAL | Status: DC | PRN
  Administered 2015-04-27 – 2015-04-28 (×3): 1 via ORAL
  Filled 2015-04-27 (×4): qty 1

## 2015-04-27 MED ORDER — ENSURE ENLIVE PO LIQD
237.0000 mL | Freq: Two times a day (BID) | ORAL | Status: DC
  Administered 2015-04-28 – 2015-05-03 (×9): 237 mL via ORAL

## 2015-04-27 NOTE — Progress Notes (Signed)
Nutrition Follow-up  DOCUMENTATION CODES:  Not applicable  INTERVENTION:  Ensure Enlive (each supplement provides 350kcal and 20 grams of protein), Snacks  NUTRITION DIAGNOSIS:  Increased nutrient needs related to other (see comment) (ETOH abuse) as evidenced by estimated needs, per patient/family report.  Ongoing  GOAL:  Patient will meet greater than or equal to 90% of their needs  Being Met  MONITOR:  PO intake, Labs, Weight trends, I & O's  REASON FOR ASSESSMENT:  Consult Assessment of nutrition requirement/status  ASSESSMENT: 49yo man with PMH of DM, HTN and nephrotic range proteinuria. He presents today for 3 week history of swelling. Further symptoms include a 30 pound weight gain, pain with walking, stretching feeling of the skin, SOB, difficulty with urination due to swollen penis, nausea, heartburn. He also admits to drinking about 2-3 quarts of liquor in a week. His friend in the room notes that he drinks mostly and forgets to eat, so has a very poor diet.  Pt states that his appetite is good, he is eating 100% of most meals, and drinking Ensure Enlive once daily. He expressed some frustration about being able to order certain foods one day and then being told that he can't have that food the next day. RD will look into issue. Pt would like to receive Ensure twice daily and healthy snacks to improve his nutrient/protein intake. His weight is down 5 lbs from last week.   Labs: low sodium, elevated BUN, low calcium, elevated AST, low ALT, high alk phos, low GFR, low hemoglobin  Height:  Ht Readings from Last 1 Encounters:  04/21/15 _0  (1.854 m)    Weight:  Wt Readings from Last 1 Encounters:  04/27/15 176 lb (79.833 kg)    Ideal Body Weight:  82.1 kg  Wt Readings from Last 10 Encounters:  04/27/15 176 lb (79.833 kg)  10/07/13 156 lb (70.761 kg)  08/11/13 156 lb (70.761 kg)  07/29/13 156 lb (70.761 kg)  07/01/13 149 lb (67.586 kg)  05/07/13 137  lb (62.143 kg)  04/06/12 157 lb (71.215 kg)    BMI:  Body mass index is 23.23 kg/(m^2).  Estimated Nutritional Needs:  Kcal:  2200 - 2400  Protein:  100 - 115 g  Fluid:  per MD  Skin:  Wound (see comment) (sacral wound); +3 generalized edema, +3 RLE and LLE edema, +3 perineal edema  Diet Order:  Diet NPO time specified  EDUCATION NEEDS:  Education needs addressed   Intake/Output Summary (Last 24 hours) at 04/27/15 1609 Last data filed at 04/27/15 0800  Gross per 24 hour  Intake    530 ml  Output   2180 ml  Net  -1650 ml    Last BM:  5/25  Pryor Ochoa RD, LDN Inpatient Clinical Dietitian Pager: [phone] After Hours Pager: [phone]

## 2015-04-27 NOTE — Progress Notes (Signed)
[facility]                                                                              Subj:-  Still feels swollen Sleepy Asking for pain meds/ativan Scrotal swelling still+ but patient reassured just fluid.   Chief Complaint  Patient presents with  . Edema  . Shortness of Breath  . Diarrhea       49yo man with PMH of DM, HTN and nephrotic range proteinuria. Admit 5/20 c 3 week history of swelling.used lysol on bed,woke up with a rash. Since that time he has been slowly swelling. The swelling has now reached his scrotum and abdomen.   Further symptoms include a 30 pound weight gain, pain with walking, stretching feeling of the skin, SOB, difficulty with urination due to swollen penis, nausea, heartburn.  On chart review, [name] does hold the diagnosis of DM and has had a 24 hour urine protein which was elevated in 2014. He was previously treated for his HTN and DM, but appears to have been lost to follow up. He tells me that he "fell out" with Dr. Kelby Aline He has been admitted primarily for orthopedic issues including cellulitis as well as chronic hip dislocations He also admits to drinking about 2-3 quarts of liquor in a week.  Patient admitted and diuresed aggressively under guidance from Nephrology who have recommended OP follow up HE has poor poor insight into his issues and doesn't feel like drinking 3-4 glasses of wine daily is abnormal    Assessment & Plan   Anasarca with h/o proteinuria -renal vs liver vs alcohol abuse -Nephrology consultation appreciated, initially on Lasix 170m PO TID-Metolazone d/c 04/26/15--start aldactone: lasix 100:40 ratio -wght 181 on admit-->176 now.  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 alk phos and concern for low grade temps [?ascites-clinically no abd swelling] -might need screening Upper Endo for varices as well  Normocytic Anemia -No evidence of bleeding, Hemoglobin remains stable ~ 10 -Anemia panel: shows adequate iron  -continue to monitor CBC  AKI/CKD -Per Renal [place] patient has chronic medical renal disease -Cr 1.6 -Continue to monitor CMET -Dr. MRica Recordswith nephrology-Nephrology signed off, and will see as an outpatient  Essential hypertension -Monitor closely  DM 2 c complications of amputation  ~ 2014 -Hemoglobin A1c 4.7 -Continue insulin sliding scale CBG monitoring  ETOH abuse/Depression -discontinue CIWA protocol 5/25 -Encourage PO intake -Dr. MRica Recordswith friend who states he drinks "a lot, he's a heavy drinker" -Psychiatry consulted and recommended outpatient follow up  Hypokalemia/Hypomagnesemia/Hypophosphatemia -Resolving -mild ? K 5/25, d/c Kdur  Hypervolemic Hyponatremia, moderate, likely 2/2 to anasarca/3rd spacing from cirrhosis -Continue fluid restriction, 1200 mL fluid restriction daily -Sodium 132 and slowly imporving -Continue to  monitor BMP  Code Status: Full  Family Communication: None at bedside  Disposition Plan: Admitted, continue diuresis Likely d/c 24-48 hrs  Time Spent in minutes   30 minutes Prolonged time spent with patient explaining disease process  Procedures  Echocardiogram  Renal US Complete abd [place]  Consults   Nephrology Psychiatry  DVT Prophylaxis  heparin  Lab Results  Component Value Date   PLT 241 04/27/2015    Medications  Scheduled Meds: . feeding supplement (ENSURE ENLIVE)  237 mL Oral Q24H  . folic acid  1 mg Oral Daily    . furosemide  80 mg Oral Daily  . heparin  5,000 Units Subcutaneous 3 times per day  . multivitamin with minerals  1 tablet Oral Daily  . sodium chloride  3 mL Intravenous Q12H  . spironolactone  200 mg Oral Daily  . thiamine  100 mg Oral Daily   Continuous Infusions:  PRN Meds:.alum & mag hydroxide-simeth, calcium carbonate, diphenhydrAMINE, diphenhydrAMINE-zinc acetate, HYDROcodone-acetaminophen, ondansetron **OR** ondansetron (ZOFRAN) IV, senna-docusate  Antibiotics    Anti-infectives    None      Objective:   Filed Vitals:   04/26/15 2118 04/27/15 0227 04/27/15 0423 04/27/15 0658  BP: 160/96 125/80  110/83  Pulse: 101 100  98  Temp: 98.9 F (37.2 C) 100 F (37.8 C)  98.9 F (37.2 C)  TempSrc: Oral Oral  Oral  Resp: _0 Height:      Weight:   79.833 kg (176 lb)   SpO2:  93%      Wt Readings from Last 3 Encounters:  04/27/15 79.833 kg (176 lb)  10/07/13 70.761 kg (156 lb)  08/11/13 70.761 kg (156 lb)     Intake/Output Summary (Last 24 hours) at 04/27/15 1139 Last data filed at 04/27/15 0800  Gross per 24 hour  Intake    530 ml  Output   2180 ml  Net  -1650 ml    Exam  General: Well developed,  no distress  HEENT: NCAT, mucous membranes moist.   Cardiovascular: S1 S2 auscultated, 2/6 SEM, Regular rate and rhythm.  Abdomen: Soft, nontender, nondistended, abdominal wall edema-no shifting dullness.  Scrootum enlarged and swollen.  Slight erythema  Extremities: warm dry without cyanosis clubbing. Grade 3+ pitting edema in LE B/L. Transmetatarsal amputation   Neuro: AAOx3, nonfocal, no tremor  Data Review   Micro Results No results found for this or any previous visit (from the past 240 hour(s)).  Radiology Reports Dg Chest 2 View  04/20/2015   CLINICAL DATA:  Edema, shortness of breath, diarrhea.  EXAM: CHEST  2 VIEW  COMPARISON:  08/16/2013  FINDINGS: There are small to moderate bilateral pleural effusions with bibasilar opacities, right  greater than left. The cardiomediastinal contours are normal. Pulmonary vasculature is normal. No pneumothorax. No acute osseous abnormalities are seen.  IMPRESSION: Small to moderate bilateral pleural effusions and bibasilar opacities, right greater than left.   Electronically Signed   By: Jeb Levering M.D.   On: 04/20/2015 23:15   US Scrotum  04/25/2015   CLINICAL DATA:  Scrotal swelling  EXAM: SCROTAL ULTRASOUND  DOPPLER ULTRASOUND OF THE TESTICLES  TECHNIQUE: Complete ultrasound examination of the testicles, epididymis, and other scrotal structures was performed. Color and spectral Doppler ultrasound were also utilized to evaluate blood flow to the testicles.  COMPARISON:  None.  FINDINGS: Right testicle  Measurements: 5.0 x 2.6 x 2.5 cm. No mass or microlithiasis visualized.  Left testicle  Measurements: 3.8  x 3.1 x 2.6 cm. No mass or microlithiasis visualized.  Right epididymis:  Obscured by overlying soft tissue edema  Left epididymis:  Normal in size and appearance.  Hydrocele:  None visualized.  Varicocele:  None visualized.  There is diffuse marked scrotal edema and skin thickening.  Pulsed Doppler interrogation of both testes demonstrates normal low resistance arterial and venous waveforms bilaterally.  IMPRESSION:  No intratesticular mass or sonographic evidence for testicular torsion.  Marked diffuse scrotal/skin thickening which could be due to hypoproteinemia/ soft tissue edema nonspecifically or cellulitis.   Electronically Signed   By: Conchita Paris M.D.   On: 04/25/2015 19:20   US Renal  04/21/2015   CLINICAL DATA:  Elevated creatinine.  Chronic kidney disease.  EXAM: RENAL / URINARY TRACT ULTRASOUND COMPLETE  COMPARISON:  None.  FINDINGS: Right Kidney:  Length: 12.9 cm.  No hydronephrosis or mass.  Increased echotexture.  Left Kidney:  Length: 12.6 cm.  No hydronephrosis or mass.  Increased echotexture.  Bladder:  Partially decompressed. Bladder wall appears thickened, likely to  decompressed state.  IMPRESSION: Increased echotexture within the kidneys suggesting chronic medical renal disease. No hydronephrosis.   Electronically Signed   By: Rolm Baptise M.D.   On: 04/21/2015 21:38   [place] Art/ven Flow Abd Pelv Doppler  04/25/2015   CLINICAL DATA:  Scrotal swelling  EXAM: SCROTAL ULTRASOUND  DOPPLER ULTRASOUND OF THE TESTICLES  TECHNIQUE: Complete ultrasound examination of the testicles, epididymis, and other scrotal structures was performed. Color and spectral Doppler ultrasound were also utilized to evaluate blood flow to the testicles.  COMPARISON:  None.  FINDINGS: Right testicle  Measurements: 5.0 x 2.6 x 2.5 cm. No mass or microlithiasis visualized.  Left testicle  Measurements: 3.8 x 3.1 x 2.6 cm. No mass or microlithiasis visualized.  Right epididymis:  Obscured by overlying soft tissue edema  Left epididymis:  Normal in size and appearance.  Hydrocele:  None visualized.  Varicocele:  None visualized.  There is diffuse marked scrotal edema and skin thickening.  Pulsed Doppler interrogation of both testes demonstrates normal low resistance arterial and venous waveforms bilaterally.  IMPRESSION:  No intratesticular mass or sonographic evidence for testicular torsion.  Marked diffuse scrotal/skin thickening which could be due to hypoproteinemia/ soft tissue edema nonspecifically or cellulitis.   Electronically Signed   By: Conchita Paris M.D.   On: 04/25/2015 19:20    CBC  Recent Labs Lab 04/22/15 0338 04/23/15 0540 04/24/15 0319 04/26/15 0428 04/27/15 0320  WBC 6.9 4.4 4.8 5.7 5.0  HGB 10.3* 11.5* 10.3* 10.0* 9.5*  HCT 28.7* 33.0* 29.8* 29.1* 27.5*  PLT 258 271 228 254 241  MCV 96.0 97.1 98.7 99.3 98.9  MCH 34.4* 33.8 34.1* 34.1* 34.2*  MCHC 35.9 34.8 34.6 34.4 34.5  RDW 13.2 13.3 13.3 13.8 13.7  LYMPHSABS  --   --   --   --  1.3  MONOABS  --   --   --   --  0.7  EOSABS  --   --   --   --  0.1  BASOSABS  --   --   --   --  0.1    Chemistries   Recent  Labs Lab 04/20/15 1953  04/22/15 0338 04/23/15 0540 04/24/15 0319 04/25/15 0340 04/26/15 0428 04/27/15 0320  NA 124*  < > 125* 128* 131* 130* 132* 132*  K 2.7*  < > 3.6 4.1 4.6 4.7 5.2* 4.9  CL 87*  < > 90* 90* 94*  93* 96* 98*  CO2 28  < > 28 32 _0 GLUCOSE 93  < > 75 93 113* 101* 79 65  BUN 9  < > _1 23* 28*  CREATININE 1.18  < > 1.49* 1.63* 1.77* 1.64* 1.58* 1.62*  CALCIUM 8.5*  < > 7.7* 7.9* 7.4* 7.4* 7.5* 7.4*  MG  --   --  1.0*  --  1.0* 1.4*  --   --   AST 41  --  37  --   --   --   --  61*  ALT 14*  --  13*  --   --   --   --  15*  ALKPHOS 157*  --  128*  --   --   --   --  276*  BILITOT 0.6  --  0.7  --   --   --   --  0.4  < > = values in this interval not displayed. ------------------------------------------------------------------------------------------------------------------ estimated creatinine clearance is 61.6 mL/min (by C-G formula based on Cr of 1.62). ------------------------------------------------------------------------------------------------------------------ No results for input(s): HGBA1C in the last 72 hours. ------------------------------------------------------------------------------------------------------------------ No results for input(s): CHOL, HDL, LDLCALC, TRIG, CHOLHDL, LDLDIRECT in the last 72 hours. ------------------------------------------------------------------------------------------------------------------ No results for input(s): TSH, T4TOTAL, T3FREE, THYROIDAB in the last 72 hours.  Invalid input(s): FREET3 ------------------------------------------------------------------------------------------------------------------ No results for input(s): VITAMINB12, FOLATE, FERRITIN, TIBC, IRON, RETICCTPCT in the last 72 hours.  Coagulation profile  Recent Labs Lab 04/27/15 0320  INR 0.95    No results for input(s): DDIMER in the last 72 hours.  Cardiac Enzymes No results for input(s): CKMB, TROPONINI, MYOGLOBIN  in the last 168 hours.  Invalid input(s): CK ------------------------------------------------------------------------------------------------------------------ Invalid input(s): POCBNP  Verneita Griffes, MD [facility] [phone]

## 2015-04-27 NOTE — Evaluation (Signed)
Physical Therapy Evaluation Patient Details Name: Wesley Burns MRN: [id number] DOB: [birth year] Today's Date: 04/27/2015   History of Present Illness  48yo man with PMH of ETOH, DM, HTN and nephrotic range proteinuria. Patient presents with swelling which has now reached his scrotum and abdomen. Further symptoms include a 30 pound weight gain, pain with walking, stretching feeling of the skin, SOB, difficulty with urination due to swollen penis, nausea, heartburn  Clinical Impression  Patient demonstrates deficits in functional mobility as indicated below. Will need continued skilled PT to address deficits and maximize function. Will see as indicated and progress as tolerated. OF NOTE: Patient with history of fall during this admission, patient could benefit from ST SNF to improve balance and mobility however, patient not receptive to this recommendation.     Follow Up Recommendations Home health PT;Supervision/Assistance - 24 hour (discussed SNF vs home, pt not interested in SNF)    Equipment Recommendations  None recommended by PT    Recommendations for Other Services       Precautions / Restrictions Precautions Precautions: Fall Restrictions Weight Bearing Restrictions: No      Mobility  Bed Mobility Overal bed mobility: Needs Assistance Bed Mobility: Supine to Sit     Supine to sit: Min assist        Transfers Overall transfer level: Needs assistance Equipment used: Rolling walker (2 wheeled) Transfers: Sit to/from [employer] Sit to Stand: Min guard Stand pivot transfers: Min assist       General transfer comment: Min guard for stability with VCs for safety and hand placement, min assist for SPT without device  Ambulation/Gait Ambulation/Gait assistance: Min guard Ambulation Distance (Feet): 140 Feet Assistive device: Rolling walker (2 wheeled) Gait Pattern/deviations: Step-through pattern;Decreased stride length;Trunk flexed;Narrow base of  support Gait velocity: decreased Gait velocity interpretation: Below normal speed for age/gender General Gait Details: flexed posture, slow cadence with VCs for upright posture  Stairs            Wheelchair Mobility    Modified Rankin (Stroke Patients Only)       Balance Overall balance assessment: Needs assistance;History of Falls Sitting-balance support: Feet supported Sitting balance-Leahy Scale: Fair     Standing balance support: Bilateral upper extremity supported;During functional activity Standing balance-Leahy Scale: Poor Standing balance comment: reliance on RW, poor ability to maintain balance without device                             Pertinent Vitals/Pain Pain Assessment: 0-10 Pain Score: 6  Pain Location: all over Pain Descriptors / Indicators: Constant Pain Intervention(s): Monitored during session;Repositioned    Home Living Family/patient expects to be discharged to:: Private residence Living Arrangements: Spouse/significant other Available Help at Discharge: Family;Friend(s);Available 24 hours/day Type of Home: House Home Access: Stairs to enter Entrance Stairs-Rails: None Entrance Stairs-Number of Steps: 2 Home Layout: Two level;Able to live on main level with bedroom/bathroom Home Equipment: Dan Humphreys - 2 wheels;Bedside commode;Shower seat;Cane - single point;Wheelchair - manual      Prior Function Level of Independence: Needs assistance   Gait / Transfers Assistance Needed: uses AD to get around, history of orthpedic issues (hip fx, hip dislocations x5, mid foot amputation, charcot bilaterally)           Hand Dominance   Dominant Hand: Right    Extremity/Trunk Assessment   Upper Extremity Assessment: Generalized weakness           Lower Extremity Assessment:  Generalized weakness;RLE deficits/detail;LLE deficits/detail (history of orthopedic issues ) RLE Deficits / Details: Mid foot amputation       Communication    Communication: No difficulties  Cognition Arousal/Alertness: Awake/alert Behavior During Therapy: WFL for tasks assessed/performed Overall Cognitive Status: No family/caregiver present to determine baseline cognitive functioning Area of Impairment: Attention;Following commands;Safety/judgement;Awareness;Problem solving   Current Attention Level: Selective     Safety/Judgement: Decreased awareness of safety   Problem Solving: Slow processing;Requires verbal cues General Comments: Patient very confabulatory and tangential during session. Question validity of patient responses at this time.    General Comments General comments (skin integrity, edema, etc.): discussed recommendations for discharge at length, patient providing varied information regarding caregivers available and functional level. Expressed my concerns regarding patient mobility and fall risk. Patient with poor reception to recommendations despite education regarding the benefits and risks given orthopedic history and fall history.     Exercises        Assessment/Plan    PT Assessment Patient needs continued PT services  PT Diagnosis Difficulty walking;Abnormality of gait   PT Problem List Decreased strength;Decreased range of motion;Decreased activity tolerance;Decreased balance;Decreased mobility;Decreased coordination;Decreased cognition;Decreased safety awareness;Pain  PT Treatment Interventions DME instruction;Gait training;Stair training;Functional mobility training;Therapeutic activities;Therapeutic exercise;Balance training;Patient/family education   PT Goals (Current goals can be found in the Care Plan section) Acute Rehab PT Goals Patient Stated Goal: to go home PT Goal Formulation: With patient Time For Goal Achievement: 05/11/15 Potential to Achieve Goals: Good    Frequency Min 3X/week   Barriers to discharge        Co-evaluation               End of Session Equipment Utilized During  Treatment: Gait belt Activity Tolerance: Patient tolerated treatment well Patient left: with call bell/phone within reach;Other (comment) (on bedside commode) Nurse Communication: Mobility status         Time: 5188-4166 PT Time Calculation (min) (ACUTE ONLY): 27 min   Charges:   PT Evaluation $Initial PT Evaluation Tier I: 1 Procedure PT Treatments $Self Care/Home Management: 8-22   PT G CodesFabio Asa 2015/05/20, 3:26 PM Charlotte Crumb, PT DPT  [phone]
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
## 2015-04-28 DIAGNOSIS — E8809 Other disorders of plasma-protein metabolism, not elsewhere classified: Secondary | ICD-10-CM

## 2015-04-28 LAB — CBC WITH DIFFERENTIAL/PLATELET
Basophils Absolute: 0.1 10*3/uL (ref 0.0–0.1)
Basophils Relative: 1 % (ref 0–1)
EOS PCT: 4 % (ref 0–5)
Eosinophils Absolute: 0.2 10*3/uL (ref 0.0–0.7)
HEMATOCRIT: 27.3 % — AB (ref 39.0–52.0)
Hemoglobin: 9.3 g/dL — ABNORMAL LOW (ref 13.0–17.0)
Lymphocytes Relative: 28 % (ref 12–46)
Lymphs Abs: 1.2 10*3/uL (ref 0.7–4.0)
MCH: 33.3 pg (ref 26.0–34.0)
MCHC: 34.1 g/dL (ref 30.0–36.0)
MCV: 97.8 fL (ref 78.0–100.0)
MONOS PCT: 16 % — AB (ref 3–12)
Monocytes Absolute: 0.7 10*3/uL (ref 0.1–1.0)
Neutro Abs: 2.1 10*3/uL (ref 1.7–7.7)
Neutrophils Relative %: 51 % (ref 43–77)
PLATELETS: 288 10*3/uL (ref 150–400)
RBC: 2.79 MIL/uL — ABNORMAL LOW (ref 4.22–5.81)
RDW: 13.8 % (ref 11.5–15.5)
WBC: 4.2 10*3/uL (ref 4.0–10.5)

## 2015-04-28 LAB — COMPREHENSIVE METABOLIC PANEL
ALT: 17 U/L (ref 17–63)
ANION GAP: 9 (ref 5–15)
AST: 67 U/L — ABNORMAL HIGH (ref 15–41)
Albumin: 1.5 g/dL — ABNORMAL LOW (ref 3.5–5.0)
Alkaline Phosphatase: 325 U/L — ABNORMAL HIGH (ref 38–126)
BILIRUBIN TOTAL: 0.3 mg/dL (ref 0.3–1.2)
BUN: 26 mg/dL — AB (ref 6–20)
CHLORIDE: 100 mmol/L — AB (ref 101–111)
CO2: 25 mmol/L (ref 22–32)
Calcium: 7.5 mg/dL — ABNORMAL LOW (ref 8.9–10.3)
Creatinine, Ser: 1.44 mg/dL — ABNORMAL HIGH (ref 0.61–1.24)
GFR, EST NON AFRICAN AMERICAN: 55 mL/min — AB (ref >60)
Glucose, Bld: 123 mg/dL — ABNORMAL HIGH (ref 65–99)
Potassium: 4.1 mmol/L (ref 3.5–5.1)
Sodium: 134 mmol/L — ABNORMAL LOW (ref 135–145)
TOTAL PROTEIN: 4.8 g/dL — AB (ref 6.5–8.1)

## 2015-04-28 LAB — GLUCOSE, CAPILLARY: Glucose-Capillary: 102 mg/dL — ABNORMAL HIGH (ref 65–99)

## 2015-04-28 LAB — PROTIME-INR
INR: 1 (ref 0.00–1.49)
Prothrombin Time: 13.4 seconds (ref 11.6–15.2)

## 2015-04-28 MED ORDER — SPIRONOLACTONE 100 MG PO TABS
100.0000 mg | ORAL_TABLET | Freq: Every day | ORAL | Status: DC
  Administered 2015-04-29 – 2015-05-03 (×5): 100 mg via ORAL
  Filled 2015-04-28 (×5): qty 1

## 2015-04-28 MED ORDER — FUROSEMIDE 40 MG PO TABS
40.0000 mg | ORAL_TABLET | Freq: Every day | ORAL | Status: DC
  Administered 2015-04-29 – 2015-05-03 (×5): 40 mg via ORAL
  Filled 2015-04-28 (×5): qty 1

## 2015-04-28 MED ORDER — HYDROCODONE-ACETAMINOPHEN 5-325 MG PO TABS
1.0000 | ORAL_TABLET | Freq: Three times a day (TID) | ORAL | Status: DC | PRN
  Administered 2015-04-28 – 2015-05-03 (×14): 1 via ORAL
  Filled 2015-04-28 (×13): qty 1

## 2015-04-28 MED ORDER — LORAZEPAM 1 MG PO TABS: 2.0000 mg | ORAL_TABLET | Freq: Once | ORAL | Status: DC

## 2015-04-28 MED ORDER — LORAZEPAM 1 MG PO TABS
1.0000 mg | ORAL_TABLET | Freq: Once | ORAL | Status: AC
  Administered 2015-04-28: 1 mg via ORAL
  Filled 2015-04-28: qty 1

## 2015-04-28 MED ORDER — BUSPIRONE HCL 10 MG PO TABS
10.0000 mg | ORAL_TABLET | Freq: Two times a day (BID) | ORAL | Status: DC
  Administered 2015-04-28 – 2015-05-03 (×10): 10 mg via ORAL
  Filled 2015-04-28 (×13): qty 1

## 2015-04-28 MED ORDER — PROPRANOLOL HCL ER 60 MG PO CP24
60.0000 mg | ORAL_CAPSULE | Freq: Every day | ORAL | Status: DC
  Administered 2015-04-28 – 2015-04-30 (×3): 60 mg via ORAL
  Filled 2015-04-28 (×3): qty 1

## 2015-04-28 NOTE — Progress Notes (Signed)
[facility]                                                                              Subj:-  Better Some pain in legs, scrootum Eating and drinking Confusion about what is the problem despite 3 seperat conversations with him by me on 3 seperat days   Chief Complaint  Patient presents with  . Edema  . Shortness of Breath  . Diarrhea       49yo man with PMH of DM, HTN and nephrotic range proteinuria. Admit 5/20 c 3 week history of swelling.used lysol on bed,woke up with a rash. Since that time he has been slowly swelling. The swelling has now reached his scrotum and abdomen.   Further symptoms include a 30 pound weight gain, pain with walking, stretching feeling of the skin, SOB, difficulty with urination due to swollen penis, nausea, heartburn.  On chart review, Mr. Featherly does hold the diagnosis of DM and has had a 24 hour urine protein which was elevated in 2014. He was previously treated for his HTN and DM, but appears to have been lost to follow up. He tells me that he "fell out" with Dr. Kelby Aline He has been admitted primarily for orthopedic issues including cellulitis as well as chronic hip dislocations He also admits to drinking about 2-3 quarts of liquor in a week.  Patient admitted and diuresed aggressively under guidance from Nephrology who have recommended OP follow up He has poor poor insight into his issues and doesn't feel like drinking 3-4 glasses of wine daily is abnormal    Assessment & Plan   Anasarca with h/o proteinuria -renal vs liver vs alcohol abuse -Nephrology consultation appreciated, initially on Lasix 118m PO TID-Metolazone d/c 04/26/15- -start aldactone: lasix 100:40 ratio--adjusted downwards his diuretis-wght 181 on admit-->176 now.  -6.6 liter to date -Echocardiogram: EF 572-09% grade 1 diastolic dysfunction -Renal utrasound: increased echotexture, suggesting chronic medical renal disease, no hydronephrosis -Mpo/pr3 ANCA and Anti-ds  DNA ab negative-C3 and C4 complement low, ASO normal-HIV and Hepatitis B/C, nonreactive - scrotal [place]5/24=diffuse swelling/cellulitis  Likely cirrhosis, ETOH, MELD score=16 -LFT's elevated 5/26 am -as he has had a trending up bun/creat -04/27/15 Abd [place] given ? alk phos shows cirrhosis -might need screening Upper Endo for varices as well as OP -started propranolol 60 daily as htn  Normocytic Anemia -No evidence of bleeding, Hemoglobin remains stable ~ 10 -Anemia panel: shows adequate iron  -continue to monitor CBC  AKI/CKD -Per Renal [place] patient has chronic medical renal disease -Cr 1.6 -Continue to monitor CMET -Dr. MRica Recordswith nephrology-Nephrology signed off, and will see as an outpatient  Essential hypertension -Monitor closely  DM 2 c complications of amputation  ~ 2014 -Hemoglobin A1c 4.7 -Continue insulin sliding scale CBG monitoring  ETOH abuse/Depression -discontinue CIWA protocol 5/25 -Encourage PO intake -Dr. MRica Recordswith friend who states he drinks "a lot, he's a heavy drinker" -Psychiatry consulted and recommended outpatient follow up -will add Buspar to meds.  Minimize ativan  Hypokalemia/Hypomagnesemia/Hypophosphatemia -Resolving -mild ? K 5/25, d/c Kdur  Hypervolemic Hyponatremia, moderate, likely 2/2 to anasarca/3rd spacing from cirrhosis -Continue fluid restriction, 1200 mL fluid restriction daily -  Sodium 134 and slowly imporving -Continue to monitor BMP  Code Status: Full  Family Communication: None at bedside  Disposition Plan: Admitted, continue diuresis Likely d/c 24-48 hrs  Time Spent in minutes   30 minutes Prolonged time spent with patient explaining disease process  Procedures  Echocardiogram  Renal US Complete abd [place]  Consults   Nephrology Psychiatry  DVT Prophylaxis  heparin  Lab Results  Component Value Date   PLT 288 04/28/2015    Medications  Scheduled Meds: . feeding supplement (ENSURE ENLIVE)   237 mL Oral BID PC  . folic acid  1 mg Oral Daily  . [START ON 04/29/2015] furosemide  40 mg Oral Daily  . heparin  5,000 Units Subcutaneous 3 times per day  . multivitamin with minerals  1 tablet Oral Daily  . propranolol ER  60 mg Oral Daily  . sodium chloride  3 mL Intravenous Q12H  . [START ON 04/29/2015] spironolactone  100 mg Oral Daily  . thiamine  100 mg Oral Daily   Continuous Infusions:  PRN Meds:.alum & mag hydroxide-simeth, calcium carbonate, diphenhydrAMINE, diphenhydrAMINE-zinc acetate, HYDROcodone-acetaminophen, LORazepam, ondansetron **OR** ondansetron (ZOFRAN) IV, senna-docusate  Antibiotics    Anti-infectives    None      Objective:   Filed Vitals:   04/27/15 2031 04/28/15 0418 04/28/15 0955 04/28/15 1532  BP: 170/90 153/88 157/99 147/81  Pulse: 110 97 115 90  Temp: 98.6 F (37 C) 98 F (36.7 C) 98.7 F (37.1 C) 99 F (37.2 C)  TempSrc: Oral Oral Oral Oral  Resp: _0 Height:      Weight:  78.6 kg (173 lb 4.5 oz)    SpO2: 98% 98% 97% 98%    Wt Readings from Last 3 Encounters:  04/28/15 78.6 kg (173 lb 4.5 oz)  10/07/13 70.761 kg (156 lb)  08/11/13 70.761 kg (156 lb)     Intake/Output Summary (Last 24 hours) at 04/28/15 1748 Last data filed at 04/28/15 1710  Gross per 24 hour  Intake    477 ml  Output   2400 ml  Net  -1923 ml    Exam  General: Well developed,  no distress  HEENT: NCAT, mucous membranes moist.   Cardiovascular: S1 S2 auscultated, 2/6 SEM, Regular rate and rhythm.  Abdomen: Soft, nontender, nondistended, abdominal wall edema-no shifting dullness.  Scrotum not examined today  Extremities: warm dry without cyanosis clubbing. Grade 2+ pitting edema in LE B/L. Amputation nv today  Neuro: AAOx3, nonfocal, no tremor  Data Review   Micro Results No results found for this or any previous visit (from the past 240 hour(s)).  Radiology Reports Dg Chest 2 View  04/20/2015   CLINICAL DATA:  Edema, shortness of breath,  diarrhea.  EXAM: CHEST  2 VIEW  COMPARISON:  08/16/2013  FINDINGS: There are small to moderate bilateral pleural effusions with bibasilar opacities, right greater than left. The cardiomediastinal contours are normal. Pulmonary vasculature is normal. No pneumothorax. No acute osseous abnormalities are seen.  IMPRESSION: Small to moderate bilateral pleural effusions and bibasilar opacities, right greater than left.   Electronically Signed   By: Jeb Levering M.D.   On: 04/20/2015 23:15   US Abdomen Complete  04/27/2015   CLINICAL DATA:  Diffuse anasarca, diabetes and hypertension. 04/21/2015 renal ultrasound  EXAM: ULTRASOUND ABDOMEN COMPLETE  COMPARISON:  None.  FINDINGS: Gallbladder: Gallbladder appears collapsed around several echogenic shadowing gallstones compatible with a wall echo sign. Wall thickness is 2.1 mm. No Percell Miller  sign elicited.  Common bile duct: Diameter: 3.8 mm  Liver: Heterogeneous nodular appearance compatible with hepatic cirrhosis. No biliary dilatation or definite focal hepatic abnormality by ultrasound.  IVC: No abnormality visualized.  Pancreas: Not visualized, obscured by bowel gas  Spleen:  mildly enlarged.  13 cm length.  Right Kidney: Length: 12.6 cm. Normal cortical thickness but mild increased echogenicity. No hydronephrosis or obstruction.  Left Kidney: Length: 12.3 cm. Slight increased echogenicity compatible with medical renal disease. Normal cortical thickness. No hydronephrosis.  Abdominal aorta: No aneurysm visualized.  Other findings: Small pleural effusions evident bilaterally. Upper abdominal ascites evident about the liver and spleen.  IMPRESSION: Collapse gallbladder containing several gallstones.  No biliary dilatation or other sign to suggest cholecystitis  Hepatic cirrhosis  Nonvisualization of the pancreas  Mild splenic enlargement  Echogenic kidneys compatible with medical renal disease  Upper abdominal mild ascites and small bilateral pleural effusions.    Electronically Signed   By: Jerilynn Mages.  Shick M.D.   On: 04/27/2015 20:23   US Scrotum  04/25/2015   CLINICAL DATA:  Scrotal swelling  EXAM: SCROTAL ULTRASOUND  DOPPLER ULTRASOUND OF THE TESTICLES  TECHNIQUE: Complete ultrasound examination of the testicles, epididymis, and other scrotal structures was performed. Color and spectral Doppler ultrasound were also utilized to evaluate blood flow to the testicles.  COMPARISON:  None.  FINDINGS: Right testicle  Measurements: 5.0 x 2.6 x 2.5 cm. No mass or microlithiasis visualized.  Left testicle  Measurements: 3.8 x 3.1 x 2.6 cm. No mass or microlithiasis visualized.  Right epididymis:  Obscured by overlying soft tissue edema  Left epididymis:  Normal in size and appearance.  Hydrocele:  None visualized.  Varicocele:  None visualized.  There is diffuse marked scrotal edema and skin thickening.  Pulsed Doppler interrogation of both testes demonstrates normal low resistance arterial and venous waveforms bilaterally.  IMPRESSION:  No intratesticular mass or sonographic evidence for testicular torsion.  Marked diffuse scrotal/skin thickening which could be due to hypoproteinemia/ soft tissue edema nonspecifically or cellulitis.   Electronically Signed   By: Conchita Paris M.D.   On: 04/25/2015 19:20   US Renal  04/21/2015   CLINICAL DATA:  Elevated creatinine.  Chronic kidney disease.  EXAM: RENAL / URINARY TRACT ULTRASOUND COMPLETE  COMPARISON:  None.  FINDINGS: Right Kidney:  Length: 12.9 cm.  No hydronephrosis or mass.  Increased echotexture.  Left Kidney:  Length: 12.6 cm.  No hydronephrosis or mass.  Increased echotexture.  Bladder:  Partially decompressed. Bladder wall appears thickened, likely to decompressed state.  IMPRESSION: Increased echotexture within the kidneys suggesting chronic medical renal disease. No hydronephrosis.   Electronically Signed   By: Rolm Baptise M.D.   On: 04/21/2015 21:38   [place] Art/ven Flow Abd Pelv Doppler  04/27/2015   CLINICAL DATA:   Diffuse anasarca, hepatic cirrhosis  EXAM: DUPLEX ULTRASOUND OF LIVER  TECHNIQUE: Color and duplex Doppler ultrasound was performed to evaluate the hepatic in-flow and out-flow vessels.  COMPARISON:  04/27/2015 abdominal ultrasound  FINDINGS: Portal Vein Velocities  Main:  40 cm/sec  Right:  30 cm/sec  Left:  20 cm/sec  Hepatic Vein Velocities  Right:  Biphasic  Middle:  Biphasic  Left:  Not visualized  Hepatic veins appear patent with predominately hepatofugal flow. Because of the biphasic flow, accurate velocity measurements were difficult to obtain.  Hepatic Artery Velocity:  86 cm/sec  Splenic Vein Velocity:  28 cm/sec  Varices: Not visualized  Ascites: Mild upper abdominal ascites  Patent portal,  hepatic and splenic veins. Normal directional flow. Negative for portal vein occlusion or thrombus.  IMPRESSION: Patent portal, hepatic splenic veins.  Mild upper abdominal ascites   Electronically Signed   By: Jerilynn Mages.  Shick M.D.   On: 04/27/2015 20:29   [place] Art/ven Flow Abd Pelv Doppler  04/25/2015   CLINICAL DATA:  Scrotal swelling  EXAM: SCROTAL ULTRASOUND  DOPPLER ULTRASOUND OF THE TESTICLES  TECHNIQUE: Complete ultrasound examination of the testicles, epididymis, and other scrotal structures was performed. Color and spectral Doppler ultrasound were also utilized to evaluate blood flow to the testicles.  COMPARISON:  None.  FINDINGS: Right testicle  Measurements: 5.0 x 2.6 x 2.5 cm. No mass or microlithiasis visualized.  Left testicle  Measurements: 3.8 x 3.1 x 2.6 cm. No mass or microlithiasis visualized.  Right epididymis:  Obscured by overlying soft tissue edema  Left epididymis:  Normal in size and appearance.  Hydrocele:  None visualized.  Varicocele:  None visualized.  There is diffuse marked scrotal edema and skin thickening.  Pulsed Doppler interrogation of both testes demonstrates normal low resistance arterial and venous waveforms bilaterally.  IMPRESSION:  No intratesticular mass or sonographic evidence  for testicular torsion.  Marked diffuse scrotal/skin thickening which could be due to hypoproteinemia/ soft tissue edema nonspecifically or cellulitis.   Electronically Signed   By: Conchita Paris M.D.   On: 04/25/2015 19:20    CBC  Recent Labs Lab 04/23/15 0540 04/24/15 0319 04/26/15 0428 04/27/15 0320 04/28/15 1057  WBC 4.4 4.8 5.7 5.0 4.2  HGB 11.5* 10.3* 10.0* 9.5* 9.3*  HCT 33.0* 29.8* 29.1* 27.5* 27.3*  PLT 271 228 254 241 288  MCV 97.1 98.7 99.3 98.9 97.8  MCH 33.8 34.1* 34.1* 34.2* 33.3  MCHC 34.8 34.6 34.4 34.5 34.1  RDW 13.3 13.3 13.8 13.7 13.8  LYMPHSABS  --   --   --  1.3 1.2  MONOABS  --   --   --  0.7 0.7  EOSABS  --   --   --  0.1 0.2  BASOSABS  --   --   --  0.1 0.1    Chemistries   Recent Labs Lab 04/22/15 0338  04/24/15 0319 04/25/15 0340 04/26/15 0428 04/27/15 0320 04/28/15 1057  NA 125*  < > 131* 130* 132* 132* 134*  K 3.6  < > 4.6 4.7 5.2* 4.9 4.1  CL 90*  < > 94* 93* 96* 98* 100*  CO2 28  < > _0 GLUCOSE 75  < > 113* 101* 79 65 123*  BUN 12  < > 16 19 23* 28* 26*  CREATININE 1.49*  < > 1.77* 1.64* 1.58* 1.62* 1.44*  CALCIUM 7.7*  < > 7.4* 7.4* 7.5* 7.4* 7.5*  MG 1.0*  --  1.0* 1.4*  --   --   --   AST 37  --   --   --   --  61* 67*  ALT 13*  --   --   --   --  15* 17  ALKPHOS 128*  --   --   --   --  276* 325*  BILITOT 0.7  --   --   --   --  0.4 0.3  < > = values in this interval not displayed. ------------------------------------------------------------------------------------------------------------------ estimated creatinine clearance is 68.2 mL/min (by C-G formula based on Cr of 1.44). ------------------------------------------------------------------------------------------------------------------ No results for input(s): HGBA1C in the last 72 hours. ------------------------------------------------------------------------------------------------------------------ No results for input(s): CHOL,  HDL, LDLCALC, TRIG, CHOLHDL,  LDLDIRECT in the last 72 hours. ------------------------------------------------------------------------------------------------------------------ No results for input(s): TSH, T4TOTAL, T3FREE, THYROIDAB in the last 72 hours.  Invalid input(s): FREET3 ------------------------------------------------------------------------------------------------------------------ No results for input(s): VITAMINB12, FOLATE, FERRITIN, TIBC, IRON, RETICCTPCT in the last 72 hours.  Coagulation profile  Recent Labs Lab 04/27/15 0320 04/28/15 1057  INR 0.95 1.00    No results for input(s): DDIMER in the last 72 hours.  Cardiac Enzymes No results for input(s): CKMB, TROPONINI, MYOGLOBIN in the last 168 hours.  Invalid input(s): CK ------------------------------------------------------------------------------------------------------------------ Invalid input(s): POCBNP  Verneita Griffes, MD [facility] [phone]

## 2015-04-28 NOTE — Plan of Care (Signed)
Problem: Phase II Progression Outcomes Goal: Obtain order to discontinue catheter if appropriate Outcome: Not Applicable Date Met:  41/28/78 Voids on own

## 2015-04-28 NOTE — Progress Notes (Addendum)
        Daily Rounding Note  04/28/2015, 8:09 AM  LOS: 7 days   SUBJECTIVE:       Not happy with downward adjustment in benzos and pain meds.  Still with joint pain and pain in skin of scrotum/abdomen/legs.  Urinating well, 1.6 liters recorded yesterday.  Eating 100% of meals.   OBJECTIVE:         Vital signs in last 24 hours:    Temp:  [98 F (36.7 C)-98.6 F (37 C)] 98 F (36.7 C) (05/27 0418) Pulse Rate:  [97-110] 97 (05/27 0418) Resp:  [18] 18 (05/27 0418) BP: (153-170)/(88-90) 153/88 mmHg (05/27 0418) SpO2:  [98 %] 98 % (05/27 0418) Weight:  [173 lb 4.5 oz (78.6 kg)] 173 lb 4.5 oz (78.6 kg) (05/27 0418) Last BM Date: 04/27/15 Filed Weights   04/26/15 0500 04/27/15 0423 04/28/15 0418  Weight: 178 lb 1.6 oz (80.786 kg) 176 lb (79.833 kg) 173 lb 4.5 oz (78.6 kg)   General: looks unwell, unable to stop talking.    Heart: RRR Chest: clear bil.  No cought or dyspnea Abdomen: soft, BS present, NT.   Extremities: + LE edema and anasarca.  Scrotal edema Neuro/Psych:  Cooperative, anxious, perseverating upon multiple issues.    Intake/Output from previous day: 05/26 0701 - 05/27 0700 In: -  Out: 1600 [Urine:1600]  Intake/Output this shift:    Lab Results:  Recent Labs  04/26/15 0428 04/27/15 0320  WBC 5.7 5.0  HGB 10.0* 9.5*  HCT 29.1* 27.5*  PLT 254 241   BMET  Recent Labs  04/26/15 0428 04/27/15 0320  NA 132* 132*  K 5.2* 4.9  CL 96* 98*  CO2 28 25  GLUCOSE 79 65  BUN 23* 28*  CREATININE 1.58* 1.62*  CALCIUM 7.5* 7.4*   LFT  Recent Labs  04/27/15 0320  PROT 4.8*  ALBUMIN 1.5*  AST 61*  ALT 15*  ALKPHOS 276*  BILITOT 0.4   PT/INR  Recent Labs  04/27/15 0320  LABPROT 12.9  INR 0.95   Studies/Results: Us Abdomen Complete  04/27/2015   CLINICAL DATA:  Diffuse anasarca, diabetes and hypertension. 04/21/2015 renal ultrasound  EXAM: ULTRASOUND ABDOMEN COMPLETE  COMPARISON:  None.   FINDINGS: Gallbladder: Gallbladder appears collapsed around several echogenic shadowing gallstones compatible with a wall echo sign. Wall thickness is 2.1 mm. No Murphy sign elicited.  Common bile duct: Diameter: 3.8 mm  Liver: Heterogeneous nodular appearance compatible with hepatic cirrhosis. No biliary dilatation or definite focal hepatic abnormality by ultrasound.  IVC: No abnormality visualized.  Pancreas: Not visualized, obscured by bowel gas  Spleen:  mildly enlarged.  13 cm length.  Right Kidney: Length: 12.6 cm. Normal cortical thickness but mild increased echogenicity. No hydronephrosis or obstruction.  Left Kidney: Length: 12.3 cm. Slight increased echogenicity compatible with medical renal disease. Normal cortical thickness. No hydronephrosis.  Abdominal aorta: No aneurysm visualized.  Other findings: Small pleural effusions evident bilaterally. Upper abdominal ascites evident about the liver and spleen.  IMPRESSION: Collapse gallbladder containing several gallstones.  No biliary dilatation or other sign to suggest cholecystitis  Hepatic cirrhosis  Nonvisualization of the pancreas  Mild splenic enlargement  Echogenic kidneys compatible with medical renal disease  Upper abdominal mild ascites and small bilateral pleural effusions.   Electronically Signed   By: M.  Shick M.D.   On: 04/27/2015 20:23   Us Art/ven Flow Abd Pelv Doppler  04/27/2015   CLINICAL DATA:  Diffuse anasarca, hepatic cirrhosis    EXAM: DUPLEX ULTRASOUND OF LIVER  TECHNIQUE: Color and duplex Doppler ultrasound was performed to evaluate the hepatic in-flow and out-flow vessels.  COMPARISON:  04/27/2015 abdominal ultrasound  FINDINGS: Portal Vein Velocities  Main:  40 cm/sec  Right:  30 cm/sec  Left:  20 cm/sec  Hepatic Vein Velocities  Right:  Biphasic  Middle:  Biphasic  Left:  Not visualized  Hepatic veins appear patent with predominately hepatofugal flow. Because of the biphasic flow, accurate velocity measurements were difficult to  obtain.  Hepatic Artery Velocity:  86 cm/sec  Splenic Vein Velocity:  28 cm/sec  Varices: Not visualized  Ascites: Mild upper abdominal ascites  Patent portal, hepatic and splenic veins. Normal directional flow. Negative for portal vein occlusion or thrombus.  IMPRESSION: Patent portal, hepatic splenic veins.  Mild upper abdominal ascites   Electronically Signed   By: M.  Shick M.D.   On: 04/27/2015 20:29   Scheduled Meds: . feeding supplement (ENSURE ENLIVE)  237 mL Oral BID PC  . folic acid  1 mg Oral Daily  . furosemide  60 mg Oral Daily  . heparin  5,000 Units Subcutaneous 3 times per day  . multivitamin with minerals  1 tablet Oral Daily  . propranolol ER  60 mg Oral Daily  . sodium chloride  3 mL Intravenous Q12H  . spironolactone  150 mg Oral Daily  . thiamine  100 mg Oral Daily   Continuous Infusions:  PRN Meds:.alum & mag hydroxide-simeth, calcium carbonate, diphenhydrAMINE, diphenhydrAMINE-zinc acetate, HYDROcodone-acetaminophen, LORazepam, ondansetron **OR** ondansetron (ZOFRAN) IV, senna-docusate  ASSESMENT:   * Alcoholism.  * Elevated alk phos, minor elevation of AST rising since admission Ultrasound: nodular liver/likely cirrhosis, mild ascites, mild splenomegaly, intact blood flow, uncomplicated gallstones.  Platelets normal, coags normal, hepatitis serologies and HIV negative.    * Volume overload, nephrotic range proteinuria, hypoproteinemia.  LVEF normal. Responding well to diuresis, low Na diet.  Weight down 12#. On Propanolol (though no evidence portal htn), high dose lasix and aldactone.    * Normocytic anemia.   * Non-specified emotional problems, but no Axis I or II diagnosis listed in psych note. Outpt psych fup planned per psych note.  No psych meds in place.   * Hypovolemic, hyponatremia. Improved.    *  CKD, stage 3.  Medical renal disease by ultrasound.    PLAN   *  Supportive care. Continue with diuresis.  Pain/anxiolytic med mgt per  hospitalist.  Might be good to get psych to outline med recommendation for anxiety.      Sarah Gribbin  04/28/2015, 8:09 AM Pager: [phone]  Patient seen, examined, and I agree with the above documentation, including the assessment and plan. Imaging suggests cirrhosis, felt alcoholic related. Viral studies negative Albumin quite low but platelets and INR normal. Liver appears very well compensated, no evidence to suggest portal hypertension. Anasarca, felt more related to hypoalbuminemia from proteinuria than that of portal hypertension Alcohol cessation is paramount Liver clinic follow-up recommended after discharge Continue diuretics with monitoring of renal function GI available, call with questions 

## 2015-04-28 NOTE — Progress Notes (Signed)
Pt placed on telemetry. CCMD notified and pt confirmed on tele box [id number].  Bed alarm on and pt instructed to call for assistance.

## 2015-04-28 NOTE — Progress Notes (Signed)
Telemetry d/c'd per Dr. Mahala Menghini.  CCMD notified.

## 2015-04-29 LAB — COMPREHENSIVE METABOLIC PANEL
ALBUMIN: 1.4 g/dL — AB (ref 3.5–5.0)
ALT: 15 U/L — ABNORMAL LOW (ref 17–63)
AST: 56 U/L — ABNORMAL HIGH (ref 15–41)
Alkaline Phosphatase: 288 U/L — ABNORMAL HIGH (ref 38–126)
Anion gap: 9 (ref 5–15)
BILIRUBIN TOTAL: 0.2 mg/dL — AB (ref 0.3–1.2)
BUN: 25 mg/dL — AB (ref 6–20)
CALCIUM: 7.5 mg/dL — AB (ref 8.9–10.3)
CO2: 23 mmol/L (ref 22–32)
CREATININE: 1.39 mg/dL — AB (ref 0.61–1.24)
Chloride: 101 mmol/L (ref 101–111)
GFR calc non Af Amer: 58 mL/min — ABNORMAL LOW (ref >60)
Glucose, Bld: 136 mg/dL — ABNORMAL HIGH (ref 65–99)
Potassium: 4.7 mmol/L (ref 3.5–5.1)
Sodium: 133 mmol/L — ABNORMAL LOW (ref 135–145)
Total Protein: 4.9 g/dL — ABNORMAL LOW (ref 6.5–8.1)

## 2015-04-29 LAB — GLUCOSE, CAPILLARY: Glucose-Capillary: 89 mg/dL (ref 65–99)

## 2015-04-29 NOTE — Progress Notes (Signed)
[facility]                                                                              Subj:-  Better No spontaneous c/o pain Has been up in commode more No diarr no cp No n ++new facial rash-unclear if was using OTC products anxious   Chief Complaint  Patient presents with  . Edema  . Shortness of Breath  . Diarrhea       49yo man with PMH of DM, HTN and nephrotic range proteinuria. Admit 5/20 c 3 week history of swelling.used lysol on bed,woke up with a rash. Since that time he has been slowly swelling. welling reached his scrotum and abdomen.  +30 pound weight gain, pain with walking, stretching feeling of the skin, SOB, difficulty with urination due to swollen penis, nausea, heartburn. On chart review, Wesley Burns does hold the diagnosis of DM and has had a 24 hour urine protein which was elevated in 2014. He was previously treated for his HTN and DM, but appears to have been lost to follow up. He tells me that he "fell out" with Dr. Kelby Aline He has been admitted primarily for orthopedic issues including cellulitis as well as chronic hip dislocations He also admits to drinking about 2-3 quarts of liquor in a week.  Patient admitted and diuresed aggressively under guidance from Nephrology who have recommended OP follow up He has poor poor insight into his issues and doesn't feel like drinking 3-4 glasses of wine daily is abnormal    Assessment & Plan   Anasarca with h/o proteinuria -renal vs liver vs alcohol abuse -Nephrology consultation appreciated, initially on Lasix 140m PO TID-Metolazone d/c 04/26/15 -start aldactone: lasix 100:40 ratio--adjusted downwards his diuretics-wght 181 on admit-->176 now.  -4 liter to date -Echocardiogram: EF 577-82% grade 1 diastolic dysfunction -Renal utrasound: increased echotexture, suggesting chronic medical renal disease, no hydronephrosis -Mpo/pr3 ANCA and Anti-ds DNA ab negative-C3 and C4 complement low, ASO normal-HIV  and Hepatitis B/C, nonreactive - scrotal [place]5/24=diffuse swelling/cellulitis--no fever so not Rx  Likely cirrhosis, ETOH, MELD score=16 -LFT's elevated 5/26 am -as he has had a trending up bun/creat -04/27/15 Abd [place] given ? alk phos shows cirrhosis -might need screening Upper Endo for varices as well as OP -started propranolol 60 daily as htn  Normocytic Anemia -No evidence of bleeding, Hemoglobin remains stable ~ 10 -Anemia panel: shows adequate iron  -continue to monitor CBC  AKI/CKD -Per Renal [place] patient has chronic medical renal disease -Cr 1.6 -Continue to monitor CMET -Dr. MRica Recordswith nephrology-Nephrology signed off, and will see as an outpatient  Essential hypertension -Monitor closely  DM 2 c complications of amputation  ~ 2014 -Hemoglobin A1c 4.7 -Continue insulin sliding scale CBG monitoring  ETOH abuse/Depression -discontinue CIWA protocol 5/25 -Encourage PO intake -Psychiatry consulted and recommended outpatient follow up -will add Buspar to meds.  Minimize ativan  Hypokalemia/Hypomagnesemia/Hypophosphatemia -Resolving -mild ? K 5/25, d/c Kdur  Hypervolemic Hyponatremia, moderate, likely 2/2 to anasarca/3rd spacing from cirrhosis -Continue fluid restriction, 1200 mL fluid restriction daily -Sodium 134 and slowly improving -Continue to monitor BMP  Code Status: Full  Family Communication: None at bedside  Disposition  Plan: Admitted, continue diuresis Needs SNF and is agreeable  Time Spent in minutes   15  Procedures  Echocardiogram  Renal US Complete abd [place]  Consults   Nephrology Psychiatry  DVT Prophylaxis  heparin  Lab Results  Component Value Date   PLT 288 04/28/2015    Medications  Scheduled Meds: . busPIRone  10 mg Oral BID  . feeding supplement (ENSURE ENLIVE)  237 mL Oral BID PC  . folic acid  1 mg Oral Daily  . furosemide  40 mg Oral Daily  . heparin  5,000 Units Subcutaneous 3 times per day  . multivitamin  with minerals  1 tablet Oral Daily  . propranolol ER  60 mg Oral Daily  . sodium chloride  3 mL Intravenous Q12H  . spironolactone  100 mg Oral Daily  . thiamine  100 mg Oral Daily   Continuous Infusions:  PRN Meds:.alum & mag hydroxide-simeth, calcium carbonate, diphenhydrAMINE, diphenhydrAMINE-zinc acetate, HYDROcodone-acetaminophen, LORazepam, ondansetron **OR** ondansetron (ZOFRAN) IV, senna-docusate  Antibiotics    Anti-infectives    None      Objective:   Filed Vitals:   04/28/15 1532 04/28/15 1837 04/28/15 2124 04/29/15 0416  BP: 147/81 164/90 156/95 150/92  Pulse: 90 81 84 90  Temp: 99 F (37.2 C) 98.5 F (36.9 C) 98.4 F (36.9 C) 98.8 F (37.1 C)  TempSrc: Oral Oral Oral Oral  Resp: _0 Height:      Weight:    77.8 kg (171 lb 8.3 oz)  SpO2: 98% 97% 98% 98%    Wt Readings from Last 3 Encounters:  04/29/15 77.8 kg (171 lb 8.3 oz)  10/07/13 70.761 kg (156 lb)  08/11/13 70.761 kg (156 lb)     Intake/Output Summary (Last 24 hours) at 04/29/15 1659 Last data filed at 04/29/15 1200  Gross per 24 hour  Intake    480 ml  Output   1100 ml  Net   -620 ml    Exam  General: Well developed,  no distress  HEENT: NCAT, mucous membranes moist.   Cardiovascular: S1 S2 auscultated, 2/6 SEM, Regular rate and rhythm.  Abdomen: Soft, nontender, nondistended, abdominal wall edema-no shifting dullness.  Scrotum not examined today  Extremities: warm dry without cyanosis clubbing. Grade 2+ pitting edema in LE B/L. Amputation nv today   Data Review   Micro Results No results found for this or any previous visit (from the past 240 hour(s)).  Radiology Reports Dg Chest 2 View  04/20/2015   CLINICAL DATA:  Edema, shortness of breath, diarrhea.  EXAM: CHEST  2 VIEW  COMPARISON:  08/16/2013  FINDINGS: There are small to moderate bilateral pleural effusions with bibasilar opacities, right greater than left. The cardiomediastinal contours are normal. Pulmonary  vasculature is normal. No pneumothorax. No acute osseous abnormalities are seen.  IMPRESSION: Small to moderate bilateral pleural effusions and bibasilar opacities, right greater than left.   Electronically Signed   By: Jeb Levering M.D.   On: 04/20/2015 23:15   US Abdomen Complete  04/27/2015   CLINICAL DATA:  Diffuse anasarca, diabetes and hypertension. 04/21/2015 renal ultrasound  EXAM: ULTRASOUND ABDOMEN COMPLETE  COMPARISON:  None.  FINDINGS: Gallbladder: Gallbladder appears collapsed around several echogenic shadowing gallstones compatible with a wall echo sign. Wall thickness is 2.1 mm. No Murphy sign elicited.  Common bile duct: Diameter: 3.8 mm  Liver: Heterogeneous nodular appearance compatible with hepatic cirrhosis. No biliary dilatation or definite focal hepatic abnormality by ultrasound.  IVC: No  abnormality visualized.  Pancreas: Not visualized, obscured by bowel gas  Spleen:  mildly enlarged.  13 cm length.  Right Kidney: Length: 12.6 cm. Normal cortical thickness but mild increased echogenicity. No hydronephrosis or obstruction.  Left Kidney: Length: 12.3 cm. Slight increased echogenicity compatible with medical renal disease. Normal cortical thickness. No hydronephrosis.  Abdominal aorta: No aneurysm visualized.  Other findings: Small pleural effusions evident bilaterally. Upper abdominal ascites evident about the liver and spleen.  IMPRESSION: Collapse gallbladder containing several gallstones.  No biliary dilatation or other sign to suggest cholecystitis  Hepatic cirrhosis  Nonvisualization of the pancreas  Mild splenic enlargement  Echogenic kidneys compatible with medical renal disease  Upper abdominal mild ascites and small bilateral pleural effusions.   Electronically Signed   By: Jerilynn Mages.  Shick M.D.   On: 04/27/2015 20:23   US Scrotum  04/25/2015   CLINICAL DATA:  Scrotal swelling  EXAM: SCROTAL ULTRASOUND  DOPPLER ULTRASOUND OF THE TESTICLES  TECHNIQUE: Complete ultrasound examination  of the testicles, epididymis, and other scrotal structures was performed. Color and spectral Doppler ultrasound were also utilized to evaluate blood flow to the testicles.  COMPARISON:  None.  FINDINGS: Right testicle  Measurements: 5.0 x 2.6 x 2.5 cm. No mass or microlithiasis visualized.  Left testicle  Measurements: 3.8 x 3.1 x 2.6 cm. No mass or microlithiasis visualized.  Right epididymis:  Obscured by overlying soft tissue edema  Left epididymis:  Normal in size and appearance.  Hydrocele:  None visualized.  Varicocele:  None visualized.  There is diffuse marked scrotal edema and skin thickening.  Pulsed Doppler interrogation of both testes demonstrates normal low resistance arterial and venous waveforms bilaterally.  IMPRESSION:  No intratesticular mass or sonographic evidence for testicular torsion.  Marked diffuse scrotal/skin thickening which could be due to hypoproteinemia/ soft tissue edema nonspecifically or cellulitis.   Electronically Signed   By: Conchita Paris M.D.   On: 04/25/2015 19:20   US Renal  04/21/2015   CLINICAL DATA:  Elevated creatinine.  Chronic kidney disease.  EXAM: RENAL / URINARY TRACT ULTRASOUND COMPLETE  COMPARISON:  None.  FINDINGS: Right Kidney:  Length: 12.9 cm.  No hydronephrosis or mass.  Increased echotexture.  Left Kidney:  Length: 12.6 cm.  No hydronephrosis or mass.  Increased echotexture.  Bladder:  Partially decompressed. Bladder wall appears thickened, likely to decompressed state.  IMPRESSION: Increased echotexture within the kidneys suggesting chronic medical renal disease. No hydronephrosis.   Electronically Signed   By: Rolm Baptise M.D.   On: 04/21/2015 21:38   [place] Art/ven Flow Abd Pelv Doppler  04/27/2015   CLINICAL DATA:  Diffuse anasarca, hepatic cirrhosis  EXAM: DUPLEX ULTRASOUND OF LIVER  TECHNIQUE: Color and duplex Doppler ultrasound was performed to evaluate the hepatic in-flow and out-flow vessels.  COMPARISON:  04/27/2015 abdominal ultrasound   FINDINGS: Portal Vein Velocities  Main:  40 cm/sec  Right:  30 cm/sec  Left:  20 cm/sec  Hepatic Vein Velocities  Right:  Biphasic  Middle:  Biphasic  Left:  Not visualized  Hepatic veins appear patent with predominately hepatofugal flow. Because of the biphasic flow, accurate velocity measurements were difficult to obtain.  Hepatic Artery Velocity:  86 cm/sec  Splenic Vein Velocity:  28 cm/sec  Varices: Not visualized  Ascites: Mild upper abdominal ascites  Patent portal, hepatic and splenic veins. Normal directional flow. Negative for portal vein occlusion or thrombus.  IMPRESSION: Patent portal, hepatic splenic veins.  Mild upper abdominal ascites   Electronically Signed  By: Eugenie Filler M.D.   On: 04/27/2015 20:29   [place] Art/ven Flow Abd Pelv Doppler  04/25/2015   CLINICAL DATA:  Scrotal swelling  EXAM: SCROTAL ULTRASOUND  DOPPLER ULTRASOUND OF THE TESTICLES  TECHNIQUE: Complete ultrasound examination of the testicles, epididymis, and other scrotal structures was performed. Color and spectral Doppler ultrasound were also utilized to evaluate blood flow to the testicles.  COMPARISON:  None.  FINDINGS: Right testicle  Measurements: 5.0 x 2.6 x 2.5 cm. No mass or microlithiasis visualized.  Left testicle  Measurements: 3.8 x 3.1 x 2.6 cm. No mass or microlithiasis visualized.  Right epididymis:  Obscured by overlying soft tissue edema  Left epididymis:  Normal in size and appearance.  Hydrocele:  None visualized.  Varicocele:  None visualized.  There is diffuse marked scrotal edema and skin thickening.  Pulsed Doppler interrogation of both testes demonstrates normal low resistance arterial and venous waveforms bilaterally.  IMPRESSION:  No intratesticular mass or sonographic evidence for testicular torsion.  Marked diffuse scrotal/skin thickening which could be due to hypoproteinemia/ soft tissue edema nonspecifically or cellulitis.   Electronically Signed   By: Conchita Paris M.D.   On: 04/25/2015 19:20     CBC  Recent Labs Lab 04/23/15 0540 04/24/15 0319 04/26/15 0428 04/27/15 0320 04/28/15 1057  WBC 4.4 4.8 5.7 5.0 4.2  HGB 11.5* 10.3* 10.0* 9.5* 9.3*  HCT 33.0* 29.8* 29.1* 27.5* 27.3*  PLT 271 228 254 241 288  MCV 97.1 98.7 99.3 98.9 97.8  MCH 33.8 34.1* 34.1* 34.2* 33.3  MCHC 34.8 34.6 34.4 34.5 34.1  RDW 13.3 13.3 13.8 13.7 13.8  LYMPHSABS  --   --   --  1.3 1.2  MONOABS  --   --   --  0.7 0.7  EOSABS  --   --   --  0.1 0.2  BASOSABS  --   --   --  0.1 0.1    Chemistries   Recent Labs Lab 04/24/15 0319 04/25/15 0340 04/26/15 0428 04/27/15 0320 04/28/15 1057 04/29/15 0310  NA 131* 130* 132* 132* 134* 133*  K 4.6 4.7 5.2* 4.9 4.1 4.7  CL 94* 93* 96* 98* 100* 101  CO2 _0 GLUCOSE 113* 101* 79 65 123* 136*  BUN 16 19 23* 28* 26* 25*  CREATININE 1.77* 1.64* 1.58* 1.62* 1.44* 1.39*  CALCIUM 7.4* 7.4* 7.5* 7.4* 7.5* 7.5*  MG 1.0* 1.4*  --   --   --   --   AST  --   --   --  61* 67* 56*  ALT  --   --   --  15* 17 15*  ALKPHOS  --   --   --  276* 325* 288*  BILITOT  --   --   --  0.4 0.3 0.2*   ------------------------------------------------------------------------------------------------------------------ estimated creatinine clearance is 70 mL/min (by C-G formula based on Cr of 1.39). ------------------------------------------------------------------------------------------------------------------ No results for input(s): HGBA1C in the last 72 hours. ------------------------------------------------------------------------------------------------------------------ No results for input(s): CHOL, HDL, LDLCALC, TRIG, CHOLHDL, LDLDIRECT in the last 72 hours. ------------------------------------------------------------------------------------------------------------------ No results for input(s): TSH, T4TOTAL, T3FREE, THYROIDAB in the last 72 hours.  Invalid input(s):  FREET3 ------------------------------------------------------------------------------------------------------------------ No results for input(s): VITAMINB12, FOLATE, FERRITIN, TIBC, IRON, RETICCTPCT in the last 72 hours.  Coagulation profile  Recent Labs Lab 04/27/15 0320 04/28/15 1057  INR 0.95 1.00    No results for input(s): DDIMER in the last 72 hours.  Cardiac Enzymes No results  for input(s): CKMB, TROPONINI, MYOGLOBIN in the last 168 hours.  Invalid input(s): CK ------------------------------------------------------------------------------------------------------------------ Invalid input(s): POCBNP  Verneita Griffes, MD [facility] [phone]

## 2015-04-30 LAB — GLUCOSE, CAPILLARY: Glucose-Capillary: 90 mg/dL (ref 65–99)

## 2015-04-30 MED ORDER — PROPRANOLOL HCL ER 80 MG PO CP24
80.0000 mg | ORAL_CAPSULE | Freq: Every day | ORAL | Status: DC
  Administered 2015-05-01 – 2015-05-03 (×3): 80 mg via ORAL
  Filled 2015-04-30 (×3): qty 1

## 2015-04-30 NOTE — Progress Notes (Signed)
[facility]                                                                              Subj:-  Well no issues Facial rash is better No n/v/cp  Chief Complaint  Patient presents with  . Edema  . Shortness of Breath  . Diarrhea       49yo man with PMH of DM, HTN and nephrotic range proteinuria. Admit 5/20 c 3 week history of swelling.used lysol on bed,woke up with a rash. Since that time he has been slowly swelling. welling reached his scrotum and abdomen.  +30 pound weight gain, pain with walking, stretching feeling of the skin, SOB, difficulty with urination due to swollen penis, nausea, heartburn. On chart review, Wesley Burns does hold the diagnosis of DM and has had a 24 hour urine protein which was elevated in 2014. He was previously treated for his HTN and DM, but appears to have been lost to follow up. He tells me that he "fell out" with Dr. Kelby Aline He has been admitted primarily for orthopedic issues including cellulitis as well as chronic hip dislocations He also admits to drinking about 2-3 quarts of liquor in a week.  Patient admitted and diuresed aggressively under guidance from Nephrology who have recommended OP follow up He has poor poor insight into his issues and doesn't feel like drinking daily jaegermiester 1/4 is abnormal He exhibited significantly anxiety and some pain medication seeking behavior while hospitlized    Assessment & Plan   Anasarca with h/o proteinuria -renal vs liver vs alcohol abuse -Nephrology consultation appreciated, initially on Lasix 110m PO TID-Metolazone d/c 04/26/15 -start aldactone: lasix 100:40 ratio--adjusted downwards his diuretics-wght 181 on admit-->172 now.  -4.5 liter to date -Echocardiogram: EF 583-66% grade 1 diastolic dysfunction -Renal utrasound: increased echotexture, suggesting chronic medical renal disease, no hydronephrosis -Mpo/pr3 ANCA and Anti-ds DNA ab negative-C3 and C4 complement low, ASO normal-HIV  and Hepatitis B/C, nonreactive - scrotal [place]5/24=diffuse swelling/cellulitis--no fever so not Rx  Facial rash -Unclear etiology -Could be 2/2 to Buspar which was started on 5/27 -patient has skin sensitivty  Likely cirrhosis, ETOH, MELD score=16 -LFT's elevated 5/26 am -as he has had a trending up bun/creat, meds ? to lasix aldactone 425m100G -04/27/15 Abd US[place] given ? alk phos shows cirrhosis -might need screening Upper Endo for varices as well as OP -started propranolol 60 daily -->80 qd  Normocytic Anemia -No evidence of bleeding, Hemoglobin remains stable ~ 10 -Anemia panel: shows adequate iron  -continue to monitor CBC  AKI/CKD -Per Renal US[place]apatient has chronic medical renal disease -Cr 1.6 -Continue to monitor CMET -Dr. MiRica Recordsith nephrology-Nephrology signed off, and will see as an outpatient  Essential hypertension -Monitor closely  DM 2 c complications of amputation  ~ 2014 -Hemoglobin A1c 4.7 -Continue insulin sliding scale CBG monitoring  ETOH abuse/Depression -discontinue CIWA protocol 5/25 -Encourage PO intake -Psychiatry consulted and recommended outpatient follow up -will add Buspar to meds.  Minimize ativan  Hypokalemia/Hypomagnesemia/Hypophosphatemia -Resolving -mild ? K 5/25, d/c Kdur  Hypervolemic Hyponatremia, moderate, likely 2/2 to anasarca/3rd spacing from cirrhosis -Continue fluid restriction, 1200 mL fluid restriction daily -Sodium 134  -am  BMP  Code Status: Full  Family Communication: None at bedside today-fully updated boyfriend Wesley Burns 5/28 on phone  Disposition Plan: Admitted, continue diuresis Needs SNF and is agreeable  Time Spent in minutes   15  Procedures  Echocardiogram  Renal US Complete abd [place]  Consults   Nephrology Psychiatry  DVT Prophylaxis  heparin  Lab Results  Component Value Date   PLT 288 04/28/2015    Medications  Scheduled Meds: . busPIRone  10 mg Oral BID  . feeding supplement  (ENSURE ENLIVE)  237 mL Oral BID PC  . folic acid  1 mg Oral Daily  . furosemide  40 mg Oral Daily  . heparin  5,000 Units Subcutaneous 3 times per day  . multivitamin with minerals  1 tablet Oral Daily  . propranolol ER  60 mg Oral Daily  . sodium chloride  3 mL Intravenous Q12H  . spironolactone  100 mg Oral Daily  . thiamine  100 mg Oral Daily   Continuous Infusions:  PRN Meds:.calcium carbonate, diphenhydrAMINE, HYDROcodone-acetaminophen, LORazepam, senna-docusate  Antibiotics    Anti-infectives    None      Objective:   Filed Vitals:   04/29/15 0416 04/29/15 2039 04/30/15 0512 04/30/15 1408  BP: 150/92 161/95 163/93 156/81  Pulse: 90 97 96 95  Temp: 98.8 F (37.1 C) 98.2 F (36.8 C) 98.6 F (37 C) 97.9 F (36.6 C)  TempSrc: Oral Oral Oral Oral  Resp: 18 18 18    Height:      Weight: 77.8 kg (171 lb 8.3 oz)  78.109 kg (172 lb 3.2 oz)   SpO2: 98% 98% 93% 95%    Wt Readings from Last 3 Encounters:  04/30/15 78.109 kg (172 lb 3.2 oz)  10/07/13 70.761 kg (156 lb)  08/11/13 70.761 kg (156 lb)     Intake/Output Summary (Last 24 hours) at 04/30/15 1416 Last data filed at 04/30/15 0600  Gross per 24 hour  Intake    120 ml  Output    600 ml  Net   -480 ml    Exam  General: Well developed,  no distress  HEENT: NCAT, mucous membranes moist.   Cardiovascular: S1 S2 auscultated, 2/6 SEM, Regular rate and rhythm.  Abdomen: Soft, nontender, nondistended, abdominal wall edema-no shifting dullness.  Scrotum not examined today  Extremities: warm dry without cyanosis clubbing. Grade 2+ pitting edema in LE B/L. Amputation nv today   Data Review   Micro Results No results found for this or any previous visit (from the past 240 hour(s)).  Radiology Reports Dg Chest 2 View  04/20/2015   CLINICAL DATA:  Edema, shortness of breath, diarrhea.  EXAM: CHEST  2 VIEW  COMPARISON:  08/16/2013  FINDINGS: There are small to moderate bilateral pleural effusions with  bibasilar opacities, right greater than left. The cardiomediastinal contours are normal. Pulmonary vasculature is normal. No pneumothorax. No acute osseous abnormalities are seen.  IMPRESSION: Small to moderate bilateral pleural effusions and bibasilar opacities, right greater than left.   Electronically Signed   By: Jeb Levering M.D.   On: 04/20/2015 23:15   US Abdomen Complete  04/27/2015   CLINICAL DATA:  Diffuse anasarca, diabetes and hypertension. 04/21/2015 renal ultrasound  EXAM: ULTRASOUND ABDOMEN COMPLETE  COMPARISON:  None.  FINDINGS: Gallbladder: Gallbladder appears collapsed around several echogenic shadowing gallstones compatible with a wall echo sign. Wall thickness is 2.1 mm. No Murphy sign elicited.  Common bile duct: Diameter: 3.8 mm  Liver: Heterogeneous nodular appearance compatible with  hepatic cirrhosis. No biliary dilatation or definite focal hepatic abnormality by ultrasound.  IVC: No abnormality visualized.  Pancreas: Not visualized, obscured by bowel gas  Spleen:  mildly enlarged.  13 cm length.  Right Kidney: Length: 12.6 cm. Normal cortical thickness but mild increased echogenicity. No hydronephrosis or obstruction.  Left Kidney: Length: 12.3 cm. Slight increased echogenicity compatible with medical renal disease. Normal cortical thickness. No hydronephrosis.  Abdominal aorta: No aneurysm visualized.  Other findings: Small pleural effusions evident bilaterally. Upper abdominal ascites evident about the liver and spleen.  IMPRESSION: Collapse gallbladder containing several gallstones.  No biliary dilatation or other sign to suggest cholecystitis  Hepatic cirrhosis  Nonvisualization of the pancreas  Mild splenic enlargement  Echogenic kidneys compatible with medical renal disease  Upper abdominal mild ascites and small bilateral pleural effusions.   Electronically Signed   By: Jerilynn Mages.  Shick M.D.   On: 04/27/2015 20:23   US Scrotum  04/25/2015   CLINICAL DATA:  Scrotal swelling  EXAM:  SCROTAL ULTRASOUND  DOPPLER ULTRASOUND OF THE TESTICLES  TECHNIQUE: Complete ultrasound examination of the testicles, epididymis, and other scrotal structures was performed. Color and spectral Doppler ultrasound were also utilized to evaluate blood flow to the testicles.  COMPARISON:  None.  FINDINGS: Right testicle  Measurements: 5.0 x 2.6 x 2.5 cm. No mass or microlithiasis visualized.  Left testicle  Measurements: 3.8 x 3.1 x 2.6 cm. No mass or microlithiasis visualized.  Right epididymis:  Obscured by overlying soft tissue edema  Left epididymis:  Normal in size and appearance.  Hydrocele:  None visualized.  Varicocele:  None visualized.  There is diffuse marked scrotal edema and skin thickening.  Pulsed Doppler interrogation of both testes demonstrates normal low resistance arterial and venous waveforms bilaterally.  IMPRESSION:  No intratesticular mass or sonographic evidence for testicular torsion.  Marked diffuse scrotal/skin thickening which could be due to hypoproteinemia/ soft tissue edema nonspecifically or cellulitis.   Electronically Signed   By: Conchita Paris M.D.   On: 04/25/2015 19:20   US Renal  04/21/2015   CLINICAL DATA:  Elevated creatinine.  Chronic kidney disease.  EXAM: RENAL / URINARY TRACT ULTRASOUND COMPLETE  COMPARISON:  None.  FINDINGS: Right Kidney:  Length: 12.9 cm.  No hydronephrosis or mass.  Increased echotexture.  Left Kidney:  Length: 12.6 cm.  No hydronephrosis or mass.  Increased echotexture.  Bladder:  Partially decompressed. Bladder wall appears thickened, likely to decompressed state.  IMPRESSION: Increased echotexture within the kidneys suggesting chronic medical renal disease. No hydronephrosis.   Electronically Signed   By: Rolm Baptise M.D.   On: 04/21/2015 21:38   [place] Art/ven Flow Abd Pelv Doppler  04/27/2015   CLINICAL DATA:  Diffuse anasarca, hepatic cirrhosis  EXAM: DUPLEX ULTRASOUND OF LIVER  TECHNIQUE: Color and duplex Doppler ultrasound was performed to  evaluate the hepatic in-flow and out-flow vessels.  COMPARISON:  04/27/2015 abdominal ultrasound  FINDINGS: Portal Vein Velocities  Main:  40 cm/sec  Right:  30 cm/sec  Left:  20 cm/sec  Hepatic Vein Velocities  Right:  Biphasic  Middle:  Biphasic  Left:  Not visualized  Hepatic veins appear patent with predominately hepatofugal flow. Because of the biphasic flow, accurate velocity measurements were difficult to obtain.  Hepatic Artery Velocity:  86 cm/sec  Splenic Vein Velocity:  28 cm/sec  Varices: Not visualized  Ascites: Mild upper abdominal ascites  Patent portal, hepatic and splenic veins. Normal directional flow. Negative for portal vein occlusion or thrombus.  IMPRESSION:  Patent portal, hepatic splenic veins.  Mild upper abdominal ascites   Electronically Signed   By: Jerilynn Mages.  Shick M.D.   On: 04/27/2015 20:29   [place] Art/ven Flow Abd Pelv Doppler  04/25/2015   CLINICAL DATA:  Scrotal swelling  EXAM: SCROTAL ULTRASOUND  DOPPLER ULTRASOUND OF THE TESTICLES  TECHNIQUE: Complete ultrasound examination of the testicles, epididymis, and other scrotal structures was performed. Color and spectral Doppler ultrasound were also utilized to evaluate blood flow to the testicles.  COMPARISON:  None.  FINDINGS: Right testicle  Measurements: 5.0 x 2.6 x 2.5 cm. No mass or microlithiasis visualized.  Left testicle  Measurements: 3.8 x 3.1 x 2.6 cm. No mass or microlithiasis visualized.  Right epididymis:  Obscured by overlying soft tissue edema  Left epididymis:  Normal in size and appearance.  Hydrocele:  None visualized.  Varicocele:  None visualized.  There is diffuse marked scrotal edema and skin thickening.  Pulsed Doppler interrogation of both testes demonstrates normal low resistance arterial and venous waveforms bilaterally.  IMPRESSION:  No intratesticular mass or sonographic evidence for testicular torsion.  Marked diffuse scrotal/skin thickening which could be due to hypoproteinemia/ soft tissue edema nonspecifically  or cellulitis.   Electronically Signed   By: Conchita Paris M.D.   On: 04/25/2015 19:20    CBC  Recent Labs Lab 04/24/15 0319 04/26/15 0428 04/27/15 0320 04/28/15 1057  WBC 4.8 5.7 5.0 4.2  HGB 10.3* 10.0* 9.5* 9.3*  HCT 29.8* 29.1* 27.5* 27.3*  PLT 228 254 241 288  MCV 98.7 99.3 98.9 97.8  MCH 34.1* 34.1* 34.2* 33.3  MCHC 34.6 34.4 34.5 34.1  RDW 13.3 13.8 13.7 13.8  LYMPHSABS  --   --  1.3 1.2  MONOABS  --   --  0.7 0.7  EOSABS  --   --  0.1 0.2  BASOSABS  --   --  0.1 0.1    Chemistries   Recent Labs Lab 04/24/15 0319 04/25/15 0340 04/26/15 0428 04/27/15 0320 04/28/15 1057 04/29/15 0310  NA 131* 130* 132* 132* 134* 133*  K 4.6 4.7 5.2* 4.9 4.1 4.7  CL 94* 93* 96* 98* 100* 101  CO2 29 28 28 25 25 23   GLUCOSE 113* 101* 79 65 123* 136*  BUN 16 19 23* 28* 26* 25*  CREATININE 1.77* 1.64* 1.58* 1.62* 1.44* 1.39*  CALCIUM 7.4* 7.4* 7.5* 7.4* 7.5* 7.5*  MG 1.0* 1.4*  --   --   --   --   AST  --   --   --  61* 67* 56*  ALT  --   --   --  15* 17 15*  ALKPHOS  --   --   --  276* 325* 288*  BILITOT  --   --   --  0.4 0.3 0.2*   ------------------------------------------------------------------------------------------------------------------ estimated creatinine clearance is 70.2 mL/min (by C-G formula based on Cr of 1.39). ------------------------------------------------------------------------------------------------------------------ No results for input(s): HGBA1C in the last 72 hours. ------------------------------------------------------------------------------------------------------------------ No results for input(s): CHOL, HDL, LDLCALC, TRIG, CHOLHDL, LDLDIRECT in the last 72 hours. ------------------------------------------------------------------------------------------------------------------ No results for input(s): TSH, T4TOTAL, T3FREE, THYROIDAB in the last 72 hours.  Invalid input(s):  FREET3 ------------------------------------------------------------------------------------------------------------------ No results for input(s): VITAMINB12, FOLATE, FERRITIN, TIBC, IRON, RETICCTPCT in the last 72 hours.  Coagulation profile  Recent Labs Lab 04/27/15 0320 04/28/15 1057  INR 0.95 1.00    No results for input(s): DDIMER in the last 72 hours.  Cardiac Enzymes No results for input(s): CKMB, TROPONINI, MYOGLOBIN in  the last 168 hours.  Invalid input(s): CK ------------------------------------------------------------------------------------------------------------------ Invalid input(s): POCBNP  Verneita Griffes, MD [facility] [phone]

## 2015-05-01 LAB — COMPREHENSIVE METABOLIC PANEL
ALT: 18 U/L (ref 17–63)
AST: 51 U/L — ABNORMAL HIGH (ref 15–41)
Albumin: 1.3 g/dL — ABNORMAL LOW (ref 3.5–5.0)
Alkaline Phosphatase: 257 U/L — ABNORMAL HIGH (ref 38–126)
Anion gap: 7 (ref 5–15)
BUN: 26 mg/dL — ABNORMAL HIGH (ref 6–20)
CHLORIDE: 103 mmol/L (ref 101–111)
CO2: 23 mmol/L (ref 22–32)
CREATININE: 1.42 mg/dL — AB (ref 0.61–1.24)
Calcium: 8 mg/dL — ABNORMAL LOW (ref 8.9–10.3)
GFR calc non Af Amer: 56 mL/min — ABNORMAL LOW (ref >60)
GLUCOSE: 83 mg/dL (ref 65–99)
POTASSIUM: 4.7 mmol/L (ref 3.5–5.1)
Sodium: 133 mmol/L — ABNORMAL LOW (ref 135–145)
TOTAL PROTEIN: 4.8 g/dL — AB (ref 6.5–8.1)
Total Bilirubin: 0.4 mg/dL (ref 0.3–1.2)

## 2015-05-01 LAB — GLUCOSE, CAPILLARY: GLUCOSE-CAPILLARY: 74 mg/dL (ref 65–99)

## 2015-05-01 MED ORDER — DIPHENHYDRAMINE HCL 25 MG PO CAPS
25.0000 mg | ORAL_CAPSULE | Freq: Four times a day (QID) | ORAL | Status: DC | PRN
  Administered 2015-05-01 – 2015-05-03 (×6): 25 mg via ORAL
  Filled 2015-05-01 (×6): qty 1

## 2015-05-01 NOTE — Progress Notes (Addendum)
PT Cancellation Note  Patient Details Name: Wesley Burns MRN: [id number] DOB: 1966/03/02   Cancelled Treatment:    Reason Eval/Treat Not Completed: Fatigue/lethargy limiting ability to participate (patient reports feeling down about medication change. patient reports interest in SNF rehab. Agree that he can benefit from short term Rehab. Will check back on Patient in PM for ambulation.)   Rada Hay 05/01/2015, 9:35 AM Blanchard Kelch PT [phone]

## 2015-05-01 NOTE — Progress Notes (Signed)
[facility]                                                                              Subj:-  Doing fair subj wheezy last pm Swelling groin an LE's is decreased.  No n/v  Chief Complaint  Patient presents with  . Edema  . Shortness of Breath  . Diarrhea       49yo man with PMH of DM, HTN and nephrotic range proteinuria. Admit 5/20 c 3 week history of swelling.used lysol on bed,woke up with a rash. Since that time he has been slowly swelling. welling reached his scrotum and abdomen.  +30 pound weight gain, pain with walking, stretching feeling of the skin, SOB, difficulty with urination due to swollen penis, nausea, heartburn. On chart review, [name] does hold the diagnosis of DM and has had a 24 hour urine protein which was elevated in 2014. He was previously treated for his HTN and DM, but appears to have been lost to follow up. He tells me that he "fell out" with Dr. Kelby Aline He has been admitted primarily for orthopedic issues including cellulitis as well as chronic hip dislocations He also admits to drinking about 2-3 quarts of liquor in a week.  Patient admitted and diuresed aggressively under guidance from Nephrology who have recommended OP follow up He has poor poor insight into his issues and doesn't feel like drinking daily jaegermiester 1/4 is abnormal He exhibited significantly anxiety and some pain medication seeking behavior while hospitlized    Assessment & Plan   Anasarca with h/o proteinuria -renal vs liver vs alcohol abuse -Nephrology consultation appreciated, initially on Lasix 125m PO TID-Metolazone d/c 04/26/15 -start aldactone: lasix 100:40 ratio--adjusted downwards his diuretics-wght 181 on admit-->172 now.  -4.5 liter to date -Echocardiogram: EF 575-91% grade 1 diastolic dysfunction -Renal utrasound: increased echotexture, suggesting chronic medical renal disease, no hydronephrosis -Mpo/pr3 ANCA and Anti-ds DNA ab negative-C3 and C4  complement low, ASO normal-HIV and Hepatitis B/C, nonreactive - scrotal [place]5/24=diffuse swelling/cellulitis--no fever so not Rx -will need OP follow up [facility] ~ 10 days  Facial rash -Unclear etiology -Could be 2/2 to Buspar which was started on 5/27 -patient has skin sensitivity -slowly resolving  Likely cirrhosis, ETOH, MELD score=16 -LFT's elevated 5/26 am -as he has had a trending up bun/creat, meds ? to lasix aldactone 473m100G -04/27/15 Abd US[place] given ? alk phos shows cirrhosis -might need screening [facility] End[facility] for varices as well as OP -started propranolol 60 daily -->80 qd  Normocytic Anemia -No evidence of bleeding, Hemoglobin remains stable ~ 10 -Anemia panel: shows adequate iron  -cbc as OP 1 week at SNF  AKI/CKD -Per Renal US[place]oreapatient has chronic medical renal disease -Cr 1.6 -Continue to monitor CMET -Dr. MiRica Recordsith nephrology-Nephrology signed off, and will see as an outpatient  Essential hypertension -Monitor closely  DM 2 c complications of amputation  ~ 2014 -Hemoglobin A1c 4.7 -Continue insulin sliding scale CBG monitoring  ETOH abuse/Depression -discontinue CIWA protocol 5/25 -Encourage PO intake -Psychiatry consulted and recommended outpatient follow up -will add Buspar to meds.  Minimize ativan  Hypokalemia/Hypomagnesemia/Hypophosphatemia -Resolving -mild ? K 5/25, d/c Kdur  Hypervolemic  Hyponatremia, moderate, likely 2/2 to anasarca/3rd spacing from cirrhosis -Continue fluid restriction, 1200 mL fluid restriction daily -Sodium 134   Code Status: Full  Family Communication: None at bedside today-fully updated boyfriend Phil 5/30 on phone  Disposition Plan: Admitted, continue diuresis Needs SNF and is agreeable Nearing discharge in 24-48 hours Will need PCP Will need [facility] follow up 10-14 days   Time Spent in minutes   10  Procedures  Echocardiogram  Renal US Complete abd [place]  Consults    Nephrology Psychiatry  DVT Prophylaxis  heparin  Lab Results  Component Value Date   PLT 288 04/28/2015    Medications  Scheduled Meds: . busPIRone  10 mg Oral BID  . feeding supplement (ENSURE ENLIVE)  237 mL Oral BID PC  . folic acid  1 mg Oral Daily  . furosemide  40 mg Oral Daily  . heparin  5,000 Units Subcutaneous 3 times per day  . multivitamin with minerals  1 tablet Oral Daily  . propranolol ER  80 mg Oral Daily  . sodium chloride  3 mL Intravenous Q12H  . spironolactone  100 mg Oral Daily  . thiamine  100 mg Oral Daily   Continuous Infusions:  PRN Meds:.calcium carbonate, diphenhydrAMINE, HYDROcodone-acetaminophen, LORazepam, senna-docusate  Antibiotics    Anti-infectives    None      Objective:   Filed Vitals:   04/30/15 1408 04/30/15 2100 05/01/15 0654 05/01/15 1022  BP: 156/81 146/84 140/78 139/68  Pulse: 95 88 83 89  Temp: 97.9 F (36.6 C) 99.5 F (37.5 C)    TempSrc: Oral Oral    Resp:  16 17   Height:      Weight:   78.064 kg (172 lb 1.6 oz)   SpO2: 95% 95% 92%     Wt Readings from Last 3 Encounters:  05/01/15 78.064 kg (172 lb 1.6 oz)  10/07/13 70.761 kg (156 lb)  08/11/13 70.761 kg (156 lb)     Intake/Output Summary (Last 24 hours) at 05/01/15 1653 Last data filed at 05/01/15 1023  Gross per 24 hour  Intake   1180 ml  Output    802 ml  Net    378 ml    Exam  General: Well developed,  no distress  HEENT: NCAT, mucous membranes moist.   Cardiovascular: S1 S2 auscultated, 2/6 SEM, Regular rate and rhythm.  Abdomen: Soft, nontender, nondistended, abdominal wall edema-no shifting dullness.  Scrotum not examined today  Extremities: warm dry without cyanosis clubbing. Grade 2+ pitting edema in LE B/L. Amputation nv today  Scrotum not examined   Data Review   Micro Results No results found for this or any previous visit (from the past 240 hour(s)).  Radiology Reports Dg Chest 2 View  04/20/2015   CLINICAL DATA:  Edema,  shortness of breath, diarrhea.  EXAM: CHEST  2 VIEW  COMPARISON:  08/16/2013  FINDINGS: There are small to moderate bilateral pleural effusions with bibasilar opacities, right greater than left. The cardiomediastinal contours are normal. Pulmonary vasculature is normal. No pneumothorax. No acute osseous abnormalities are seen.  IMPRESSION: Small to moderate bilateral pleural effusions and bibasilar opacities, right greater than left.   Electronically Signed   By: Jeb Levering M.D.   On: 04/20/2015 23:15   US Abdomen Complete  04/27/2015   CLINICAL DATA:  Diffuse anasarca, diabetes and hypertension. 04/21/2015 renal ultrasound  EXAM: ULTRASOUND ABDOMEN COMPLETE  COMPARISON:  None.  FINDINGS: Gallbladder: Gallbladder appears collapsed around several echogenic shadowing gallstones  compatible with a wall echo sign. Wall thickness is 2.1 mm. No Murphy sign elicited.  Common bile duct: Diameter: 3.8 mm  Liver: Heterogeneous nodular appearance compatible with hepatic cirrhosis. No biliary dilatation or definite focal hepatic abnormality by ultrasound.  IVC: No abnormality visualized.  Pancreas: Not visualized, obscured by bowel gas  Spleen:  mildly enlarged.  13 cm length.  Right Kidney: Length: 12.6 cm. Normal cortical thickness but mild increased echogenicity. No hydronephrosis or obstruction.  Left Kidney: Length: 12.3 cm. Slight increased echogenicity compatible with medical renal disease. Normal cortical thickness. No hydronephrosis.  Abdominal aorta: No aneurysm visualized.  Other findings: Small pleural effusions evident bilaterally. Upper abdominal ascites evident about the liver and spleen.  IMPRESSION: Collapse gallbladder containing several gallstones.  No biliary dilatation or other sign to suggest cholecystitis  Hepatic cirrhosis  Nonvisualization of the pancreas  Mild splenic enlargement  Echogenic kidneys compatible with medical renal disease  Upper abdominal mild ascites and small bilateral pleural  effusions.   Electronically Signed   By: Jerilynn Mages.  Shick M.D.   On: 04/27/2015 20:23   US Scrotum  04/25/2015   CLINICAL DATA:  Scrotal swelling  EXAM: SCROTAL ULTRASOUND  DOPPLER ULTRASOUND OF THE TESTICLES  TECHNIQUE: Complete ultrasound examination of the testicles, epididymis, and other scrotal structures was performed. Color and spectral Doppler ultrasound were also utilized to evaluate blood flow to the testicles.  COMPARISON:  None.  FINDINGS: Right testicle  Measurements: 5.0 x 2.6 x 2.5 cm. No mass or microlithiasis visualized.  Left testicle  Measurements: 3.8 x 3.1 x 2.6 cm. No mass or microlithiasis visualized.  Right epididymis:  Obscured by overlying soft tissue edema  Left epididymis:  Normal in size and appearance.  Hydrocele:  None visualized.  Varicocele:  None visualized.  There is diffuse marked scrotal edema and skin thickening.  Pulsed Doppler interrogation of both testes demonstrates normal low resistance arterial and venous waveforms bilaterally.  IMPRESSION:  No intratesticular mass or sonographic evidence for testicular torsion.  Marked diffuse scrotal/skin thickening which could be due to hypoproteinemia/ soft tissue edema nonspecifically or cellulitis.   Electronically Signed   By: Conchita Paris M.D.   On: 04/25/2015 19:20   US Renal  04/21/2015   CLINICAL DATA:  Elevated creatinine.  Chronic kidney disease.  EXAM: RENAL / URINARY TRACT ULTRASOUND COMPLETE  COMPARISON:  None.  FINDINGS: Right Kidney:  Length: 12.9 cm.  No hydronephrosis or mass.  Increased echotexture.  Left Kidney:  Length: 12.6 cm.  No hydronephrosis or mass.  Increased echotexture.  Bladder:  Partially decompressed. Bladder wall appears thickened, likely to decompressed state.  IMPRESSION: Increased echotexture within the kidneys suggesting chronic medical renal disease. No hydronephrosis.   Electronically Signed   By: Rolm Baptise M.D.   On: 04/21/2015 21:38   [place] Art/ven Flow Abd Pelv Doppler  04/27/2015    CLINICAL DATA:  Diffuse anasarca, hepatic cirrhosis  EXAM: DUPLEX ULTRASOUND OF LIVER  TECHNIQUE: Color and duplex Doppler ultrasound was performed to evaluate the hepatic in-flow and out-flow vessels.  COMPARISON:  04/27/2015 abdominal ultrasound  FINDINGS: Portal Vein Velocities  Main:  40 cm/sec  Right:  30 cm/sec  Left:  20 cm/sec  Hepatic Vein Velocities  Right:  Biphasic  Middle:  Biphasic  Left:  Not visualized  Hepatic veins appear patent with predominately hepatofugal flow. Because of the biphasic flow, accurate velocity measurements were difficult to obtain.  Hepatic Artery Velocity:  86 cm/sec  Splenic Vein Velocity:  28 cm/sec  Varices: Not visualized  Ascites: Mild upper abdominal ascites  Patent portal, hepatic and splenic veins. Normal directional flow. Negative for portal vein occlusion or thrombus.  IMPRESSION: Patent portal, hepatic splenic veins.  Mild upper abdominal ascites   Electronically Signed   By: Jerilynn Mages.  Shick M.D.   On: 04/27/2015 20:29   [place] Art/ven Flow Abd Pelv Doppler  04/25/2015   CLINICAL DATA:  Scrotal swelling  EXAM: SCROTAL ULTRASOUND  DOPPLER ULTRASOUND OF THE TESTICLES  TECHNIQUE: Complete ultrasound examination of the testicles, epididymis, and other scrotal structures was performed. Color and spectral Doppler ultrasound were also utilized to evaluate blood flow to the testicles.  COMPARISON:  None.  FINDINGS: Right testicle  Measurements: 5.0 x 2.6 x 2.5 cm. No mass or microlithiasis visualized.  Left testicle  Measurements: 3.8 x 3.1 x 2.6 cm. No mass or microlithiasis visualized.  Right epididymis:  Obscured by overlying soft tissue edema  Left epididymis:  Normal in size and appearance.  Hydrocele:  None visualized.  Varicocele:  None visualized.  There is diffuse marked scrotal edema and skin thickening.  Pulsed Doppler interrogation of both testes demonstrates normal low resistance arterial and venous waveforms bilaterally.  IMPRESSION:  No intratesticular mass or  sonographic evidence for testicular torsion.  Marked diffuse scrotal/skin thickening which could be due to hypoproteinemia/ soft tissue edema nonspecifically or cellulitis.   Electronically Signed   By: Conchita Paris M.D.   On: 04/25/2015 19:20    CBC  Recent Labs Lab 04/26/15 0428 04/27/15 0320 04/28/15 1057  WBC 5.7 5.0 4.2  HGB 10.0* 9.5* 9.3*  HCT 29.1* 27.5* 27.3*  PLT 254 241 288  MCV 99.3 98.9 97.8  MCH 34.1* 34.2* 33.3  MCHC 34.4 34.5 34.1  RDW 13.8 13.7 13.8  LYMPHSABS  --  1.3 1.2  MONOABS  --  0.7 0.7  EOSABS  --  0.1 0.2  BASOSABS  --  0.1 0.1    Chemistries   Recent Labs Lab 04/25/15 0340 04/26/15 0428 04/27/15 0320 04/28/15 1057 04/29/15 0310 05/01/15 0316  NA 130* 132* 132* 134* 133* 133*  K 4.7 5.2* 4.9 4.1 4.7 4.7  CL 93* 96* 98* 100* 101 103  CO2 28 28 25 25 23 23   GLUCOSE 101* 79 65 123* 136* 83  BUN 19 23* 28* 26* 25* 26*  CREATININE 1.64* 1.58* 1.62* 1.44* 1.39* 1.42*  CALCIUM 7.4* 7.5* 7.4* 7.5* 7.5* 8.0*  MG 1.4*  --   --   --   --   --   AST  --   --  61* 67* 56* 51*  ALT  --   --  15* 17 15* 18  ALKPHOS  --   --  276* 325* 288* 257*  BILITOT  --   --  0.4 0.3 0.2* 0.4   ------------------------------------------------------------------------------------------------------------------ estimated creatinine clearance is 68.8 mL/min (by C-G formula based on Cr of 1.42). ------------------------------------------------------------------------------------------------------------------ No results for input(s): HGBA1C in the last 72 hours. ------------------------------------------------------------------------------------------------------------------ No results for input(s): CHOL, HDL, LDLCALC, TRIG, CHOLHDL, LDLDIRECT in the last 72 hours. ------------------------------------------------------------------------------------------------------------------ No results for input(s): TSH, T4TOTAL, T3FREE, THYROIDAB in the last 72 hours.  Invalid  input(s): FREET3 ------------------------------------------------------------------------------------------------------------------ No results for input(s): VITAMINB12, FOLATE, FERRITIN, TIBC, IRON, RETICCTPCT in the last 72 hours.  Coagulation profile  Recent Labs Lab 04/27/15 0320 04/28/15 1057  INR 0.95 1.00    No results for input(s): DDIMER in the last 72 hours.  Cardiac Enzymes No results for input(s): CKMB, TROPONINI, MYOGLOBIN in  the last 168 hours.  Invalid input(s): CK ------------------------------------------------------------------------------------------------------------------ Invalid input(s): POCBNP  Verneita Griffes, MD [facility] [phone]

## 2015-05-02 LAB — GLUCOSE, CAPILLARY: Glucose-Capillary: 100 mg/dL — ABNORMAL HIGH (ref 65–99)

## 2015-05-02 MED ORDER — THIAMINE HCL 100 MG PO TABS
100.0000 mg | ORAL_TABLET | Freq: Every day | ORAL | Status: DC
Start: 2015-05-02 — End: 2015-09-30

## 2015-05-02 MED ORDER — BUSPIRONE HCL 10 MG PO TABS: 10.0000 mg | ORAL_TABLET | Freq: Two times a day (BID) | ORAL | Status: DC

## 2015-05-02 MED ORDER — LORAZEPAM 2 MG PO TABS: 2.0000 mg | ORAL_TABLET | Freq: Every evening | ORAL | Status: DC | PRN

## 2015-05-02 MED ORDER — DIPHENHYDRAMINE HCL 25 MG PO CAPS: 25.0000 mg | ORAL_CAPSULE | Freq: Four times a day (QID) | ORAL | Status: DC | PRN

## 2015-05-02 MED ORDER — HYDROCODONE-ACETAMINOPHEN 5-325 MG PO TABS: 1.0000 | ORAL_TABLET | Freq: Two times a day (BID) | ORAL | Status: DC | PRN

## 2015-05-02 MED ORDER — PROPRANOLOL HCL ER 80 MG PO CP24: 80.0000 mg | ORAL_CAPSULE | Freq: Every day | ORAL | Status: DC

## 2015-05-02 MED ORDER — SPIRONOLACTONE 100 MG PO TABS: 100.0000 mg | ORAL_TABLET | Freq: Every day | ORAL | Status: DC

## 2015-05-02 MED ORDER — FUROSEMIDE 40 MG PO TABS: 40.0000 mg | ORAL_TABLET | Freq: Every day | ORAL | Status: DC

## 2015-05-02 MED ORDER — SENNOSIDES-DOCUSATE SODIUM 8.6-50 MG PO TABS: 1.0000 | ORAL_TABLET | Freq: Every evening | ORAL | Status: DC | PRN

## 2015-05-02 NOTE — Discharge Summary (Signed)
Physician Discharge Summary  Wesley Burns PTW:[id number] DOB: [birth year] DOA: 04/20/2015  PCP: No PCP Per Patient  Admit date: 04/20/2015 Discharge date: 05/02/2015  Time spent: 25 minutes  Recommendations for Outpatient Follow-up:  1.  will need close follow-up with gastroenterology to address  fluid status fluid status , diarrhetic dosages etc. Etc. 2.  please get complete metabolic panel INR and CBC in 1 week 3.  patient has specifically been told we will not give him long-term pain medications going forward. Please wean oxycodone as you see appropriate at skilled facility  4. Patient will benefit from outpatient psychiatry referral-he was placed on BuSpar 10 mg twice a day this admission. He may continue 2 mg of Ativan daily at bedtime for anxiety or sleep. 5. Patient will benefit from skilled therapy at skilled nursing home whenever we can determine which one he will go to 6.  patient will need continued daily weights and 1200 cc restriction on fluid.  Discharge Diagnoses:  Principal Problem:   Alcohol abuse Active Problems:   Hypertension   Essential hypertension, benign   Proteinuria   Hypokalemia   Anasarca   Hyponatremia   CKD stage 4 due to type 2 diabetes mellitus   Alcoholic cirrhosis of liver with ascites    D/c condition doing fair  Diet recommendation: 2 gram fluid restrict, high protein  Filed Weights   04/30/15 0512 05/01/15 0654 05/02/15 0610  Weight: 78.109 kg (172 lb 3.2 oz) 78.064 kg (172 lb 1.6 oz) 77.384 kg (170 lb 9.6 oz)    History of present illness:  49yo man with PMH of DM, HTN and nephrotic range proteinuria.  Admit 5/20 c 3 week history of swelling. used lysol on bed,woke up with a rash.  Since that time he has been slowly swelling.  welling reached his scrotum and abdomen.   +30 pound weight gain, pain with walking, stretching feeling of the skin, SOB, difficulty with urination due to swollen penis, nausea, heartburn.  On chart review, Mr.  Wesley Burns does hold the diagnosis of DM and has had a 24 hour urine protein which was elevated in 2014.  He was previously treated for his HTN and DM, but appears to have been lost to follow up. He tells me that he "fell out" with Wesley Burns He has been admitted primarily for orthopedic issues including cellulitis as well as chronic hip dislocations He also admits to drinking about 2-3 quarts of liquor in a week.    Patient admitted and diuresed aggressively under guidance from Nephrology who have recommended OP follow up He has poor poor insight into his issues and doesn't feel like drinking daily jaegermiester 1/4 is abnormal He exhibited significantly anxiety  while hospitlized which gradually resoleved See below   Hospital Course:  [name] with h/o proteinuria -renal vs liver vs alcohol abuse -Nephrology consultation appreciated, initially on Lasix 153m PO TID-Metolazone d/c 04/26/15 -start aldactone: lasix 100:40 ratio--adjusted downwards his diuretics-wght 181 on admit-->172 now.  -3.4 liters for hospital stay liter to date -Echocardiogram: EF 570-01% grade 1 diastolic dysfunction -Renal utrasound: increased echotexture, suggesting chronic medical renal disease, no hydronephrosis -Mpo/pr3 ANCA and Anti-ds DNA ab negative-C3 and C4 complement low, ASO normal-HIV and Hepatitis B/C, nonreactive - scrotal [place]5/24=diffuse swelling/cellulitis--no fever so not Rx -will need OP follow up [facility] ~ 10 days  Facial rash -Unclear etiology -Could be 2/2 to Buspar which was started on 5/27 -patient has skin sensitivity -slowly resolving  Likely cirrhosis, ETOH, MELD score=16 when last checked -LFT's elevated 5/26 am -as he has had a trending up bun/creat, meds ? to lasix aldactone 53m:100G -04/27/15 Abd UK[place] given ? alk phos shows cirrhosis  -might need screening Upper Endo for varices as well as OP -started propranolol 60 daily -->80 qd  Normocytic  Anemia -No evidence of bleeding, Hemoglobin remains stable ~ 10 -Anemia panel: shows adequate iron  -cbc as OP 1 week at SNF  AKI/CKD -Per Renal [place] patient has chronic medical renal disease -Cr 1.6 -Continue to monitor CMET -Dr. MRica Recordswith nephrology-Nephrology signed off, and will see as an outpatient  Essential hypertension -Monitor closely  DM 2 c complications of amputation ~ 2014 -Hemoglobin A1c 4.7 -Continue insulin sliding scale CBG monitoring  ETOH abuse/Depression -discontinue CIWA protocol 5/25 -Encourage PO intake -Psychiatry consulted and recommended outpatient follow up -will add Buspar to meds. Minimize ativan  Hypokalemia/Hypomagnesemia/Hypophosphatemia -Resolving -mild ? K 5/25, d/c'd Kdur  Hypervolemic Hyponatremia, moderate, likely 2/2 to anasarca/3rd spacing from cirrhosis -Continue fluid restriction, 1200 mL fluid restriction daily -Sodium 133 on d/c to SNF        Consultations:  Nephrology  GI   Discharge Exam: Filed Vitals:   05/02/15 0610  BP: 154/84  Pulse: 86  Temp: 99.3 F (37.4 C)  Resp: 16    General: fair, rash to face, no other c/o Cardiovascular: s1 s2 no m/r/g Respiratory: clear Lower extremity swelling still  Discharge Instructions   Discharge Instructions    Diet - low sodium heart healthy    Complete by:  As directed      Discharge instructions    Complete by:  As directed      Increase activity slowly    Complete by:  As directed           Current Discharge Medication List    START taking these medications   Details  busPIRone (BUSPAR) 10 MG tablet Take 1 tablet (10 mg total) by mouth 2 (two) times daily. Qty: 60 tablet, Refills: 0    diphenhydrAMINE (BENADRYL) 25 mg capsule Take 1 capsule (25 mg total) by mouth every 6 (six) hours as needed for itching. Qty: 30 capsule, Refills: 0    furosemide (LASIX) 40 MG tablet Take 1 tablet (40 mg total) by mouth daily. Qty: 30 tablet, Refills: 0     LORazepam (ATIVAN) 2 MG tablet Take 1 tablet (2 mg total) by mouth at bedtime as needed for anxiety or seizure. Qty: 30 tablet, Refills: 0    propranolol ER (INDERAL LA) 80 MG 24 hr capsule Take 1 capsule (80 mg total) by mouth daily.    senna-docusate (SENOKOT-S) 8.6-50 MG per tablet Take 1 tablet by mouth at bedtime as needed for mild constipation.    spironolactone (ALDACTONE) 100 MG tablet Take 1 tablet (100 mg total) by mouth daily.    thiamine 100 MG tablet Take 1 tablet (100 mg total) by mouth daily.      CONTINUE these medications which have CHANGED   Details  HYDROcodone-acetaminophen (NORCO/VICODIN) 5-325 MG per tablet Take 1 tablet by mouth every 12 (twelve) hours as needed for moderate pain. Qty: 30 tablet, Refills: 0      STOP taking these medications     methocarbamol (ROBAXIN) 500 MG tablet      potassium chloride SA (K-DUR,KLOR-CON) 20 MEQ tablet        Allergies  Allergen Reactions  . Prednisone Hives, Nausea And Vomiting and Swelling  Joint swelling   . Amlodipine Nausea And Vomiting  . Tramadol Hives, Itching and Rash      The results of significant diagnostics from this hospitalization (including imaging, microbiology, ancillary and laboratory) are listed below for reference.    Significant Diagnostic Studies: Dg Chest 2 View  04/20/2015   CLINICAL DATA:  Edema, shortness of breath, diarrhea.  EXAM: CHEST  2 VIEW  COMPARISON:  08/16/2013  FINDINGS: There are small to moderate bilateral pleural effusions with bibasilar opacities, right greater than left. The cardiomediastinal contours are normal. Pulmonary vasculature is normal. No pneumothorax. No acute osseous abnormalities are seen.  IMPRESSION: Small to moderate bilateral pleural effusions and bibasilar opacities, right greater than left.   Electronically Signed   By: Jeb Levering M.D.   On: 04/20/2015 23:15   US Abdomen Complete  04/27/2015   CLINICAL DATA:  Diffuse anasarca, diabetes and  hypertension. 04/21/2015 renal ultrasound  EXAM: ULTRASOUND ABDOMEN COMPLETE  COMPARISON:  None.  FINDINGS: Gallbladder: Gallbladder appears collapsed around several echogenic shadowing gallstones compatible with a wall echo sign. Wall thickness is 2.1 mm. No Murphy sign elicited.  Common bile duct: Diameter: 3.8 mm  Liver: Heterogeneous nodular appearance compatible with hepatic cirrhosis. No biliary dilatation or definite focal hepatic abnormality by ultrasound.  IVC: No abnormality visualized.  Pancreas: Not visualized, obscured by bowel gas  Spleen:  mildly enlarged.  13 cm length.  Right Kidney: Length: 12.6 cm. Normal cortical thickness but mild increased echogenicity. No hydronephrosis or obstruction.  Left Kidney: Length: 12.3 cm. Slight increased echogenicity compatible with medical renal disease. Normal cortical thickness. No hydronephrosis.  Abdominal aorta: No aneurysm visualized.  Other findings: Small pleural effusions evident bilaterally. Upper abdominal ascites evident about the liver and spleen.  IMPRESSION: Collapse gallbladder containing several gallstones.  No biliary dilatation or other sign to suggest cholecystitis  Hepatic cirrhosis  Nonvisualization of the pancreas  Mild splenic enlargement  Echogenic kidneys compatible with medical renal disease  Upper abdominal mild ascites and small bilateral pleural effusions.   Electronically Signed   By: Jerilynn Mages.  Shick M.D.   On: 04/27/2015 20:23   US Scrotum  04/25/2015   CLINICAL DATA:  Scrotal swelling  EXAM: SCROTAL ULTRASOUND  DOPPLER ULTRASOUND OF THE TESTICLES  TECHNIQUE: Complete ultrasound examination of the testicles, epididymis, and other scrotal structures was performed. Color and spectral Doppler ultrasound were also utilized to evaluate blood flow to the testicles.  COMPARISON:  None.  FINDINGS: Right testicle  Measurements: 5.0 x 2.6 x 2.5 cm. No mass or microlithiasis visualized.  Left testicle  Measurements: 3.8 x 3.1 x 2.6 cm. No mass or  microlithiasis visualized.  Right epididymis:  Obscured by overlying soft tissue edema  Left epididymis:  Normal in size and appearance.  Hydrocele:  None visualized.  Varicocele:  None visualized.  There is diffuse marked scrotal edema and skin thickening.  Pulsed Doppler interrogation of both testes demonstrates normal low resistance arterial and venous waveforms bilaterally.  IMPRESSION:  No intratesticular mass or sonographic evidence for testicular torsion.  Marked diffuse scrotal/skin thickening which could be due to hypoproteinemia/ soft tissue edema nonspecifically or cellulitis.   Electronically Signed   By: Conchita Paris M.D.   On: 04/25/2015 19:20   US Renal  04/21/2015   CLINICAL DATA:  Elevated creatinine.  Chronic kidney disease.  EXAM: RENAL / URINARY TRACT ULTRASOUND COMPLETE  COMPARISON:  None.  FINDINGS: Right Kidney:  Length: 12.9 cm.  No hydronephrosis or mass.  Increased echotexture.  Left Kidney:  Length: 12.6 cm.  No hydronephrosis or mass.  Increased echotexture.  Bladder:  Partially decompressed. Bladder wall appears thickened, likely to decompressed state.  IMPRESSION: Increased echotexture within the kidneys suggesting chronic medical renal disease. No hydronephrosis.   Electronically Signed   By: Rolm Baptise M.D.   On: 04/21/2015 21:38   [place] Art/ven Flow Abd Pelv Doppler  04/27/2015   CLINICAL DATA:  Diffuse anasarca, hepatic cirrhosis  EXAM: DUPLEX ULTRASOUND OF LIVER  TECHNIQUE: Color and duplex Doppler ultrasound was performed to evaluate the hepatic in-flow and out-flow vessels.  COMPARISON:  04/27/2015 abdominal ultrasound  FINDINGS: Portal Vein Velocities  Main:  40 cm/sec  Right:  30 cm/sec  Left:  20 cm/sec  Hepatic Vein Velocities  Right:  Biphasic  Middle:  Biphasic  Left:  Not visualized  Hepatic veins appear patent with predominately hepatofugal flow. Because of the biphasic flow, accurate velocity measurements were difficult to obtain.  Hepatic Artery Velocity:  86  cm/sec  Splenic Vein Velocity:  28 cm/sec  Varices: Not visualized  Ascites: Mild upper abdominal ascites  Patent portal, hepatic and splenic veins. Normal directional flow. Negative for portal vein occlusion or thrombus.  IMPRESSION: Patent portal, hepatic splenic veins.  Mild upper abdominal ascites   Electronically Signed   By: Jerilynn Mages.  Shick M.D.   On: 04/27/2015 20:29   [place] Art/ven Flow Abd Pelv Doppler  04/25/2015   CLINICAL DATA:  Scrotal swelling  EXAM: SCROTAL ULTRASOUND  DOPPLER ULTRASOUND OF THE TESTICLES  TECHNIQUE: Complete ultrasound examination of the testicles, epididymis, and other scrotal structures was performed. Color and spectral Doppler ultrasound were also utilized to evaluate blood flow to the testicles.  COMPARISON:  None.  FINDINGS: Right testicle  Measurements: 5.0 x 2.6 x 2.5 cm. No mass or microlithiasis visualized.  Left testicle  Measurements: 3.8 x 3.1 x 2.6 cm. No mass or microlithiasis visualized.  Right epididymis:  Obscured by overlying soft tissue edema  Left epididymis:  Normal in size and appearance.  Hydrocele:  None visualized.  Varicocele:  None visualized.  There is diffuse marked scrotal edema and skin thickening.  Pulsed Doppler interrogation of both testes demonstrates normal low resistance arterial and venous waveforms bilaterally.  IMPRESSION:  No intratesticular mass or sonographic evidence for testicular torsion.  Marked diffuse scrotal/skin thickening which could be due to hypoproteinemia/ soft tissue edema nonspecifically or cellulitis.   Electronically Signed   By: Conchita Paris M.D.   On: 04/25/2015 19:20    Microbiology: No results found for this or any previous visit (from the past 240 hour(s)).   Labs: Basic Metabolic Panel:  Recent Labs Lab 04/26/15 0428 04/27/15 0320 04/28/15 1057 04/29/15 0310 05/01/15 0316  NA 132* 132* 134* 133* 133*  K 5.2* 4.9 4.1 4.7 4.7  CL 96* 98* 100* 101 103  CO2 28 25 25 23 23   GLUCOSE 79 65 123* 136* 83  BUN  23* 28* 26* 25* 26*  CREATININE 1.58* 1.62* 1.44* 1.39* 1.42*  CALCIUM 7.5* 7.4* 7.5* 7.5* 8.0*   Liver Function Tests:  Recent Labs Lab 04/27/15 0320 04/28/15 1057 04/29/15 0310 05/01/15 0316  AST 61* 67* 56* 51*  ALT 15* 17 15* 18  ALKPHOS 276* 325* 288* 257*  BILITOT 0.4 0.3 0.2* 0.4  PROT 4.8* 4.8* 4.9* 4.8*  ALBUMIN 1.5* 1.5* 1.4* 1.3*   No results for input(s): LIPASE, AMYLASE in the last 168 hours. No results for input(s): AMMONIA in the last 168 hours. CBC:  Recent Labs Lab 04/26/15 0428 04/27/15 0320 04/28/15 1057  WBC 5.7 5.0 4.2  NEUTROABS  --  2.8 2.1  HGB 10.0* 9.5* 9.3*  HCT 29.1* 27.5* 27.3*  MCV 99.3 98.9 97.8  PLT 254 241 288   Cardiac Enzymes: No results for input(s): CKTOTAL, CKMB, CKMBINDEX, TROPONINI in the last 168 hours. BNP: BNP (last 3 results)  Recent Labs  04/20/15 1953  BNP 178.3*    ProBNP (last 3 results) No results for input(s): PROBNP in the last 8760 hours.  CBG:  Recent Labs Lab 04/28/15 0608 04/29/15 0543 04/30/15 0543 05/01/15 0649 05/02/15 0549  GLUCAP 102* 89 90 74 100*       Signed:  Nita Sells  [facility] 05/02/2015, 10:51 AM

## 2015-05-02 NOTE — Clinical Social Work Placement (Signed)
   CLINICAL SOCIAL WORK PLACEMENT  NOTE  Date:  05/02/2015  Patient Details  Name: [name] MRN: [id number] Date of Birth: [birth year]  Clinical Social Work is seeking post-discharge placement for this patient at the Skilled  Nursing Facility level of care (*CSW will initial, date and re-position this form in  chart as items are completed):  Yes   Patient/family provided with [facility] Clinical Social Work Department's list of facilities offering this level of care within the geographic area requested by the patient (or if unable, by the patient's family).  Yes   Patient/family informed of their freedom to choose among providers that offer the needed level of care, that participate in Medicare, Medicaid or managed care program needed by the patient, have an available bed and are willing to accept the patient.  Yes   Patient/family informed of [facility]'s ownership interest in [facility] and [facility], as well as of the fact that they are under no obligation to receive care at these facilities.  PASRR submitted to EDS on 05/02/15     PASRR number received on       Existing PASRR number confirmed on       FL2 transmitted to all facilities in geographic area requested by pt/family on 05/02/15     FL2 transmitted to all facilities within larger geographic area on       Patient informed that his/her managed care company has contracts with or will negotiate with certain facilities, including the following:   ([facility]         Patient/family informed of bed offers received.  Patient chooses bed at       Physician recommends and patient chooses bed at      Patient to be transferred to   on  .  Patient to be transferred to facility by       Patient family notified on   of transfer.  Name of family member notified:        PHYSICIAN Please prepare priority discharge summary, including medications, Please sign FL2, Please prepare  prescriptions     Additional Comment:    _______________________________________________ Darylene Price, LCSW 05/02/2015, 5:55 PM

## 2015-05-02 NOTE — Progress Notes (Signed)
Physical Therapy Treatment Patient Details Name: Wesley Burns MRN: [id number] DOB: Jun 07, 1966 Today's Date: 05/02/2015    History of Present Illness 49yo man with PMH of ETOH, DM, HTN and nephrotic range proteinuria. Patient presents with swelling which has now reached his scrotum and abdomen. Further symptoms include a 30 pound weight gain, pain with walking, stretching feeling of the skin, SOB, difficulty with urination due to swollen penis, nausea, heartburn    PT Comments    Pt with lots of items throughout his room and increased time for positioning items as pt desired as well as need for voiding and increased time for all transfers. Pt educated for safety, need to increase mobility with assist and therapy goals to return to mod I level. Pt states his partner is at home and works from home and can provide supervision for D/C. Will follow acutely.   Follow Up Recommendations  SNF;Supervision/Assistance - 24 hour     Equipment Recommendations       Recommendations for Other Services       Precautions / Restrictions Precautions Precautions: Fall    Mobility  Bed Mobility Overal bed mobility: Modified Independent Bed Mobility: Supine to Sit     Supine to sit: Modified independent (Device/Increase time)        Transfers     Transfers: Sit to/from Stand Sit to Stand: Supervision         General transfer comment: supervision for safety. pt stood from bed x 2, BSC x 1  Ambulation/Gait Ambulation/Gait assistance: Min guard Ambulation Distance (Feet): 400 Feet Assistive device: Rolling walker (2 wheeled) Gait Pattern/deviations: Step-through pattern;Decreased stride length;Trunk flexed;Narrow base of support   Gait velocity interpretation: Below normal speed for age/gender General Gait Details: flexed posture, slow cadence with VCs for upright posture and position in RW   Stairs            Wheelchair Mobility    Modified Rankin (Stroke Patients  Only)       Balance Overall balance assessment: Needs assistance   Sitting balance-Leahy Scale: Good       Standing balance-Leahy Scale: Fair                      Cognition Arousal/Alertness: Awake/alert Behavior During Therapy: WFL for tasks assessed/performed Overall Cognitive Status: No family/caregiver present to determine baseline cognitive functioning               Problem Solving: Slow processing      Exercises      General Comments        Pertinent Vitals/Pain Pain Assessment: No/denies pain    Home Living                      Prior Function            PT Goals (current goals can now be found in the care plan section) Progress towards PT goals: Progressing toward goals    Frequency       PT Plan Current plan remains appropriate    Co-evaluation             End of Session   Activity Tolerance: Patient tolerated treatment well Patient left: in chair;with call bell/phone within reach     Time: 1131-1204 PT Time Calculation (min) (ACUTE ONLY): 33 min  Charges:  $Gait Training: 8-22 mins $Therapeutic Activity: 8-22 mins  G CodesDelorse Lek 05-11-15, 12:57 PM [name], PT [phone]

## 2015-05-02 NOTE — Clinical Social Work Note (Signed)
Clinical Social Work Assessment  Patient Details  Name: Wesley Burns MRN: [id number] Date of Birth: 1966-03-16  Date of referral:  05/01/15               Reason for consult:  Facility Placement                Permission sought to share information with:  Facility Sport and exercise psychologist, Family Supports Permission granted to share information::  Yes, Verbal Permission Granted  Name::     [facility] SNFs', Partner- Financial planner  Agency::     Relationship::     Contact Information:     Housing/Transportation Living arrangements for the past 2 months:  [place] of Information:  Patient, Partner Patient Interpreter Needed:  None Criminal Activity/Legal Involvement Pertinent to Current Situation/Hospitalization:  No - Comment as needed Significant Relationships:  Soil scientist Lives with:  Domestic Partner Do you feel safe going back to the place where you live?  No ("Not right now- I feel like I need to go to rehab first- then home") Need for family participation in patient care:  Yes (Comment)  Care giving concerns:  Patient lives at home with his domestic partner- Wesley Burns. He is normally self-sufficient of his ADLs but his extended hospitalization has resulted in increased weakness.  He is anxious to get rehab so he can return home and to work.  He does not want to be a "burden" on Phil or anyone else.   Social Worker assessment / plan:  Patient was scheduled to return home but SNF now recommeds SNF as patient agrees to short term SNF.  CSw met with patient and his partner Wesley Burns.  Discussed bed search options and Wesley Burns wants to tour the facilities to choose the "best one."  CSW discussed issues with bed availability as well as need for bed offers.  Fl2 completed and referral sent to area facilities.  Wesley Burns has toured [facility] and stated that it was "perfect" for patient's rehab needs.  Awaiting bed offers at this time.  Per MD- patient is medically stable for d/c.  Fl2  placed on chart for MD's signature. Plan d/c to a SNF tomorrow.  CSW encouraged patient and partner to have a "back up" SNF in case [facility] is unable to accept patient.    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 His partner is in full agreement with this.  Patient/Family's Understanding of and Emotional Response to Diagnosis, Current Treatment, and Prognosis:  Patient is able to verbalize a better understanding now of his current medical needs and treatment plan- although this has not been the case during the majority of his hospitalization. He has become accepting of his physical current limitations and agrees to shor term SNF. He is most anxious to return home and resume his work and independence. Patient's partner Wesley Burns exhibits a very strong understanding of patient's current health issues and needs and while he is very supportive- he is also strongly encouraging short term rehab.  Patient noted to be relaxed, smiling and welcoming to CSW during visit and verbalized appreciation for all assistance provided.    Emotional Assessment Appearance:  Appears older than stated age, Disheveled Attitude/Demeanor/Rapport:   (  Pleasant, talkative, interactive with CSW) Affect (typically observed):  Calm, Happy, Appropriate, Hopeful, Accepting (A little apprehensive about wanting to find a "good' SNF for rehab but trusts his Partner to help him wih this) Orientation:  Oriented to Self, Oriented to Place, Oriented to  Time, Oriented to Situation Alcohol / Substance use:  Tobacco Use, Alcohol Use Psych involvement (Current and /or in the community):  Yes (Comment)  (Assessed patient and signed off. Recommended outpatient follow up as needed.)  Discharge Needs  Concerns to be addressed:  Care Coordination Readmission within the last 30 days:  No Current discharge risk:  Substance Abuse, Dependent with Mobility, Psychiatric Illness (Depression and anxiety) Barriers to Discharge:  No Barriers Identified   Williemae Area, LCSW 05/02/2015, 9:08 PM

## 2015-05-02 NOTE — Progress Notes (Signed)
Pasarr has been submitted to [facility] and is currently in manual review. Awaiting number from  Must as patient cannot be placed without this assigned number.  Will monitor in the am to determine if PASARR number has been assigned. CSW notified Dr. Mahala Menghini of delay in d/c. Hopefully can d/c patient tomorrow.    Lorri Frederick. Jaci Lazier, [place] [phone]

## 2015-05-03 LAB — GLUCOSE, CAPILLARY: Glucose-Capillary: 98 mg/dL (ref 65–99)

## 2015-05-03 MED ORDER — HYDROCODONE-ACETAMINOPHEN 5-325 MG PO TABS: 1.0000 | ORAL_TABLET | Freq: Two times a day (BID) | ORAL | Status: DC | PRN

## 2015-05-03 MED ORDER — LORAZEPAM 2 MG PO TABS: 2.0000 mg | ORAL_TABLET | Freq: Every evening | ORAL | Status: DC | PRN

## 2015-05-03 MED ORDER — BUSPIRONE HCL 10 MG PO TABS: 10.0000 mg | ORAL_TABLET | Freq: Two times a day (BID) | ORAL | Status: DC

## 2015-05-03 NOTE — Progress Notes (Signed)
PASARR number in place; bed availability confirmed with Janie at [facility]. CSW notified Dr. Janee Morn.  DC summary completed yesterday with dc date of 05/02/15. Need to amend dc date to 05/03/15 or put in a progress note stating there is "No change in d/c summary since 05/02/15".  Notified patient's partner Michele Mcalpine. He will plan to be at the SNF at 1:00 to sign papers.  Will arrange EMS for this afternoon once d/c is confirmed and then notify patient/nursing.  Lorri Frederick. Jaci Lazier, [place] [phone]

## 2015-05-03 NOTE — Clinical Social Work Placement (Signed)
   CLINICAL SOCIAL WORK PLACEMENT  NOTE  Date:  05/03/2015  Patient Details  Name: Wesley Burns MRN: [id number] Date of Birth: 10-10-1966  Clinical Social Work is seeking post-discharge placement for this patient at the Skilled  Nursing Facility level of care (*CSW will initial, date and re-position this form in  chart as items are completed):  Yes   Patient/family provided with [facility] Clinical Social Work Department's list of facilities offering this level of care within the geographic area requested by the patient (or if unable, by the patient's family).  Yes   Patient/family informed of their freedom to choose among providers that offer the needed level of care, that participate in Medicare, Medicaid or managed care program needed by the patient, have an available bed and are willing to accept the patient.  Yes   Patient/family informed of [facility]'s ownership interest in [facility] and [facility], as well as of the fact that they are under no obligation to receive care at these facilities.  PASRR submitted to EDS on 05/02/15     PASRR number received on     05/03/2015 Existing PASRR number confirmed on       FL2 transmitted to all facilities in geographic area requested by pt/family on 05/02/15     FL2 transmitted to all facilities within larger geographic area on       Patient informed that his/her managed care company has contracts with or will negotiate with certain facilities, including the following:   ([employer] [facility]         Patient/family informed of bed offers received.  05/03/2015   Patient chooses bed at   [facility], [place]   Physician recommends     on  .  Patient to be transferred to facility   [facility]  By car with Partner- Phil   Patient family notified on 05/03/15   of transfer.  Name of family member notified:   Mosie Epstein- Partner    PHYSICIAN Please prepare priority discharge summary, including medications, Please sign  FL2, Please prepare prescriptions     Additional Comment:   Ok per MD for d/c today to SNF for short term SNF. Patient stated that he is very excited about moving to [facility] and that he is feeling much better.  He requests to be transported via car with his partner so that they can have a "few minutes together" as they drive over to the facility.  Nursing notified to call report. No further CSW needs identified. CSW signing off.  Lorri Frederick. Andria Rhein [phone]      _______________________________________________ Lovette Cliche T, LCSW 05/03/2015, 12:30 PM

## 2015-05-03 NOTE — Progress Notes (Signed)
[facility] PROGRESS NOTE  Wesley Burns QMG:[id number] DOB: June 03, 1966 DOA: 04/20/2015 PCP: No PCP Per Patient  No change in d/c summary since 05/02/15  Assessment/Plan: #1 anasarca with history of proteinuria Likely secondary to renal versus liver versus alcohol abuse. Patient with clinical improvement and good diureses. Patient currently denies any shortness of breath. 2-D echo obtained at a year for 55-60% with grade 1 diastolic dysfunction with no wall motion abnormalities. Renal ultrasound which was done was consistent with chronic medical renal disease and negative for hydronephrosis. Mpo/pr3 ANCA and anti-ds ab negative. C3 and C4 complements were low. ASO was normal. HIV and hepatitis B/C was nonreactive. Scrotal ultrasound which was done on 04/25/2015 was consistent with diffuse swelling/cellulitis. Patient remained afebrile and as such was not placed on anti-biotic treatment. Patient was seen in consultation by nephrology and patient initially started on Lasix 160 mg by mouth 3 times a day and metabolism which was discontinued 04/26/2015. Patient was subsequently transitioned to Aldactone: Lasix 100: 40 ratio which was down titrated with his diarrhetic's as his weight from admission was 181 pounds and subsequently 171 pounds on day of discharge. Patient is -2.48 L during this hospitalization. Patient need to follow-up with GI as outpatient.  #2 facial rash Unclear etiology. May be secondary to acne versus secondary to BuSpar which was started on 04/28/2015. Improving slowly. Outpatient follow-up.  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 MELD score = 16. LFTs were elevated however slowly trending down. Continue current regimen of Lasix and Aldactone. Abdominal ultrasound done on 04/27/2015 showed cirrhosis. Patient on propranolol. Patient may need screening upper endoscopy for varices as outpatient.  #4 normocytic anemia Patient with no evidence of bleeding. H&H stable. Outpatient  follow-up.  #5 acute kidney injury/chronic kidney disease Renal ultrasound was consistent with chronic medical disease and negative for hydronephrosis. Patient was seen in consultation by nephrology. Renal function seems to have stabilized. Nephrology recommended outpatient follow-up.  #6 hypertension Stable.  #7 type 2 diabetes mellitus Hemoglobin A1c 4.7. Patient was maintained on a sliding scale insulin.  #8 alcohol abuse/depression Patient was on the Ativan withdrawal protocol which was discontinued on 04/26/2015. Patient with no signs or symptoms of withdrawal. Patient has been seen in consultation by psychiatry recommended outpatient follow-up. Patient has been started on BuSpar.  #9 electrolyte abnormalities Resolved.  #10 hypervolemic hyponatremia likely secondary to anasarca and third spacing secondary to cirrhosis. Improvement. Continue fluid restriction.    Code Status: Full Family Communication: Updated patient. No family present. Disposition Plan: Discharged to skilled nursing facility today.   Consultants:  Gastroenterology: Dr. Rhea Belton 04/27/2015  Psychiatry: Dr. Elsie Saas 04/24/2015  Nephrology: Dr. Darrick Penna 04/21/2015  Procedures:  2-D echo 04/21/2015  Renal ultrasound 04/21/2015  Scrotal ultrasound 04/25/2015  Abdominal ultrasound 04/27/2015  CXR 04/20/15  Antibiotics:  None  HPI/Subjective: Patient with no complaints. Patient with no chest pain. Patient with no shortness of breath. Patient states he status post be discharged to a skilled nursing facility today.  Objective: Filed Vitals:   05/03/15 0611  BP: 145/80  Pulse: 71  Temp:   Resp: 16    Intake/Output Summary (Last 24 hours) at 05/03/15 1017 Last data filed at 05/03/15 0612  Gross per 24 hour  Intake    303 ml  Output    625 ml  Net   -322 ml   Filed Weights   05/01/15 0654 05/02/15 0610 05/03/15 0611  Weight: 78.064 kg (172 lb 1.6 oz) 77.384 kg (170 lb 9.6 oz)  77.565  kg (171 lb)    Exam:   General:  NAD  Cardiovascular: RRR  Respiratory: CTAB  Abdomen: Soft/NT/ND/+BS  Musculoskeletal: No c/c. 1-2 + BLE edema  Data Reviewed: Basic Metabolic Panel:  Recent Labs Lab 04/27/15 0320 04/28/15 1057 04/29/15 0310 05/01/15 0316  NA 132* 134* 133* 133*  K 4.9 4.1 4.7 4.7  CL 98* 100* 101 103  CO2 25 25 23 23   GLUCOSE 65 123* 136* 83  BUN 28* 26* 25* 26*  CREATININE 1.62* 1.44* 1.39* 1.42*  CALCIUM 7.4* 7.5* 7.5* 8.0*   Liver Function Tests:  Recent Labs Lab 04/27/15 0320 04/28/15 1057 04/29/15 0310 05/01/15 0316  AST 61* 67* 56* 51*  ALT 15* 17 15* 18  ALKPHOS 276* 325* 288* 257*  BILITOT 0.4 0.3 0.2* 0.4  PROT 4.8* 4.8* 4.9* 4.8*  ALBUMIN 1.5* 1.5* 1.4* 1.3*   No results for input(s): LIPASE, AMYLASE in the last 168 hours. No results for input(s): AMMONIA in the last 168 hours. CBC:  Recent Labs Lab 04/27/15 0320 04/28/15 1057  WBC 5.0 4.2  NEUTROABS 2.8 2.1  HGB 9.5* 9.3*  HCT 27.5* 27.3*  MCV 98.9 97.8  PLT 241 288   Cardiac Enzymes: No results for input(s): CKTOTAL, CKMB, CKMBINDEX, TROPONINI in the last 168 hours. BNP (last 3 results)  Recent Labs  04/20/15 1953  BNP 178.3*    ProBNP (last 3 results) No results for input(s): PROBNP in the last 8760 hours.  CBG:  Recent Labs Lab 04/29/15 0543 04/30/15 0543 05/01/15 0649 05/02/15 0549 05/03/15 0546  GLUCAP 89 90 74 100* 98    No results found for this or any previous visit (from the past 240 hour(s)).   Studies: No results found.  Scheduled Meds: . busPIRone  10 mg Oral BID  . feeding supplement (ENSURE ENLIVE)  237 mL Oral BID PC  . folic acid  1 mg Oral Daily  . furosemide  40 mg Oral Daily  . heparin  5,000 Units Subcutaneous 3 times per day  . multivitamin with minerals  1 tablet Oral Daily  . propranolol ER  80 mg Oral Daily  . sodium chloride  3 mL Intravenous Q12H  . spironolactone  100 mg Oral Daily  . thiamine  100 mg  Oral Daily   Continuous Infusions:   Principal Problem:   Anasarca Active Problems:   Hypertension   Essential hypertension, benign   Proteinuria   Hypokalemia   Hyponatremia   Alcohol abuse   CKD stage 4 due to type 2 diabetes mellitus   Alcoholic cirrhosis of liver with ascites    Time spent: 35 mins    [facility] MD [facility] Pager [phone]. If 7PM-7AM, please contact night-coverage at [email or web address], password [facility] 05/03/2015, 10:17 AM  LOS: 12 days

## 2015-05-03 NOTE — Progress Notes (Addendum)
Pt has orders to be discharged. Discharge instructions given and pt has no additional questions at this time. Medication regimen reviewed and pt educated. Pt verbalized understanding and has no additional questions. Report called to [facility] Briant Sites RN. IV removed and site in good condition. Pt stable and waiting for transportation.   Jilda Panda RN

## 2015-06-19 ENCOUNTER — Other Ambulatory Visit: Payer: Self-pay | Admitting: Family Medicine

## 2015-06-19 ENCOUNTER — Ambulatory Visit
Admission: RE | Admit: 2015-06-19 | Discharge: 2015-06-19 | Disposition: A | Discharge status: Home / Self Care | Payer: 59 | Source: Ambulatory Visit | Attending: Family Medicine | Admitting: Family Medicine

## 2015-06-19 DIAGNOSIS — R0781 Pleurodynia: Secondary | ICD-10-CM

## 2015-08-29 ENCOUNTER — Other Ambulatory Visit: Payer: Self-pay | Admitting: Orthopedic Surgery

## 2015-08-29 ENCOUNTER — Ambulatory Visit
Admission: RE | Admit: 2015-08-29 | Discharge: 2015-08-29 | Disposition: A | Discharge status: Home / Self Care | Payer: 59 | Source: Ambulatory Visit | Attending: Orthopedic Surgery | Admitting: Orthopedic Surgery

## 2015-08-29 DIAGNOSIS — S42292A Other displaced fracture of upper end of left humerus, initial encounter for closed fracture: Secondary | ICD-10-CM
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
## 2015-09-01 ENCOUNTER — Encounter (HOSPITAL_COMMUNITY): Payer: Self-pay | Admitting: *Deleted

## 2015-09-03 MED ORDER — CEFAZOLIN SODIUM-DEXTROSE 2-3 GM-% IV SOLR
2.0000 g | INTRAVENOUS | Status: AC
  Administered 2015-09-04: 2 g via INTRAVENOUS
  Filled 2015-09-03: qty 50

## 2015-09-04 ENCOUNTER — Ambulatory Visit (HOSPITAL_COMMUNITY): Payer: 59

## 2015-09-04 ENCOUNTER — Ambulatory Visit (HOSPITAL_COMMUNITY): Payer: 59 | Admitting: Anesthesiology

## 2015-09-04 ENCOUNTER — Inpatient Hospital Stay (HOSPITAL_COMMUNITY)
Admission: AD | Admit: 2015-09-04 | Discharge: 2015-09-06 | DRG: 493 | Disposition: A | Discharge status: Home Health | Payer: 59 | Source: Ambulatory Visit | Attending: Orthopedic Surgery | Admitting: Orthopedic Surgery

## 2015-09-04 ENCOUNTER — Encounter (HOSPITAL_COMMUNITY)
Admission: AD | Disposition: A | Discharge status: Home Health | Payer: Self-pay | Source: Ambulatory Visit | Attending: Orthopedic Surgery

## 2015-09-04 ENCOUNTER — Encounter (HOSPITAL_COMMUNITY): Payer: Self-pay | Admitting: Surgery

## 2015-09-04 DIAGNOSIS — R944 Abnormal results of kidney function studies: Secondary | ICD-10-CM | POA: Diagnosis not present

## 2015-09-04 DIAGNOSIS — N189 Chronic kidney disease, unspecified: Secondary | ICD-10-CM | POA: Diagnosis present

## 2015-09-04 DIAGNOSIS — E871 Hypo-osmolality and hyponatremia: Secondary | ICD-10-CM | POA: Diagnosis not present

## 2015-09-04 DIAGNOSIS — S4290XA Fracture of unspecified shoulder girdle, part unspecified, initial encounter for closed fracture: Secondary | ICD-10-CM | POA: Diagnosis present

## 2015-09-04 DIAGNOSIS — S62101A Fracture of unspecified carpal bone, right wrist, initial encounter for closed fracture: Secondary | ICD-10-CM | POA: Diagnosis present

## 2015-09-04 DIAGNOSIS — D62 Acute posthemorrhagic anemia: Secondary | ICD-10-CM | POA: Diagnosis not present

## 2015-09-04 DIAGNOSIS — K219 Gastro-esophageal reflux disease without esophagitis: Secondary | ICD-10-CM | POA: Diagnosis present

## 2015-09-04 DIAGNOSIS — Z96643 Presence of artificial hip joint, bilateral: Secondary | ICD-10-CM | POA: Diagnosis present

## 2015-09-04 DIAGNOSIS — F17210 Nicotine dependence, cigarettes, uncomplicated: Secondary | ICD-10-CM | POA: Diagnosis present

## 2015-09-04 DIAGNOSIS — W19XXXA Unspecified fall, initial encounter: Secondary | ICD-10-CM | POA: Diagnosis present

## 2015-09-04 DIAGNOSIS — I129 Hypertensive chronic kidney disease with stage 1 through stage 4 chronic kidney disease, or unspecified chronic kidney disease: Secondary | ICD-10-CM | POA: Diagnosis present

## 2015-09-04 DIAGNOSIS — F1290 Cannabis use, unspecified, uncomplicated: Secondary | ICD-10-CM | POA: Diagnosis present

## 2015-09-04 DIAGNOSIS — I10 Essential (primary) hypertension: Secondary | ICD-10-CM | POA: Diagnosis present

## 2015-09-04 DIAGNOSIS — S42212A Unspecified displaced fracture of surgical neck of left humerus, initial encounter for closed fracture: Secondary | ICD-10-CM | POA: Diagnosis present

## 2015-09-04 DIAGNOSIS — E1122 Type 2 diabetes mellitus with diabetic chronic kidney disease: Secondary | ICD-10-CM | POA: Diagnosis present

## 2015-09-04 DIAGNOSIS — M25512 Pain in left shoulder: Secondary | ICD-10-CM | POA: Diagnosis present

## 2015-09-04 DIAGNOSIS — E875 Hyperkalemia: Secondary | ICD-10-CM | POA: Diagnosis not present

## 2015-09-04 DIAGNOSIS — Z419 Encounter for procedure for purposes other than remedying health state, unspecified: Secondary | ICD-10-CM

## 2015-09-04 HISTORY — PX: ORIF HUMERUS FRACTURE: SHX2126

## 2015-09-04 LAB — COMPREHENSIVE METABOLIC PANEL
ALT: 12 U/L — AB (ref 17–63)
AST: 14 U/L — AB (ref 15–41)
Albumin: 2.3 g/dL — ABNORMAL LOW (ref 3.5–5.0)
Alkaline Phosphatase: 117 U/L (ref 38–126)
Anion gap: 8 (ref 5–15)
BUN: 90 mg/dL — AB (ref 6–20)
CO2: 13 mmol/L — ABNORMAL LOW (ref 22–32)
CREATININE: 4.12 mg/dL — AB (ref 0.61–1.24)
Calcium: 9.8 mg/dL (ref 8.9–10.3)
Chloride: 113 mmol/L — ABNORMAL HIGH (ref 101–111)
GFR calc Af Amer: 18 mL/min — ABNORMAL LOW (ref >60)
GFR, EST NON AFRICAN AMERICAN: 15 mL/min — AB (ref >60)
Glucose, Bld: 109 mg/dL — ABNORMAL HIGH (ref 65–99)
Potassium: 5.5 mmol/L — ABNORMAL HIGH (ref 3.5–5.1)
Sodium: 134 mmol/L — ABNORMAL LOW (ref 135–145)
Total Bilirubin: 1.1 mg/dL (ref 0.3–1.2)
Total Protein: 6 g/dL — ABNORMAL LOW (ref 6.5–8.1)

## 2015-09-04 LAB — CBC
HEMATOCRIT: 24.1 % — AB (ref 39.0–52.0)
Hemoglobin: 8.1 g/dL — ABNORMAL LOW (ref 13.0–17.0)
MCH: 29.8 pg (ref 26.0–34.0)
MCHC: 33.6 g/dL (ref 30.0–36.0)
MCV: 88.6 fL (ref 78.0–100.0)
PLATELETS: 454 10*3/uL — AB (ref 150–400)
RBC: 2.72 MIL/uL — AB (ref 4.22–5.81)
RDW: 14.4 % (ref 11.5–15.5)
WBC: 10.1 10*3/uL (ref 4.0–10.5)

## 2015-09-04 LAB — GLUCOSE, CAPILLARY: Glucose-Capillary: 103 mg/dL — ABNORMAL HIGH (ref 65–99)

## 2015-09-04 SURGERY — OPEN REDUCTION INTERNAL FIXATION (ORIF) PROXIMAL HUMERUS FRACTURE
Anesthesia: Regional | Site: Arm Upper | Laterality: Left

## 2015-09-04 MED ORDER — CHLORHEXIDINE GLUCONATE 4 % EX LIQD: 60.0000 mL | Freq: Once | CUTANEOUS | Status: DC

## 2015-09-04 MED ORDER — PROPOFOL 10 MG/ML IV BOLUS
INTRAVENOUS | Status: DC | PRN
  Administered 2015-09-04: 100 mg via INTRAVENOUS

## 2015-09-04 MED ORDER — PHENYLEPHRINE 40 MCG/ML (10ML) SYRINGE FOR IV PUSH (FOR BLOOD PRESSURE SUPPORT)
PREFILLED_SYRINGE | INTRAVENOUS | Status: AC
  Filled 2015-09-04: qty 10

## 2015-09-04 MED ORDER — NEOSTIGMINE METHYLSULFATE 10 MG/10ML IV SOLN
INTRAVENOUS | Status: DC | PRN
  Administered 2015-09-04: 3 mg via INTRAVENOUS

## 2015-09-04 MED ORDER — POLYETHYLENE GLYCOL 3350 17 G PO PACK: 17.0000 g | PACK | Freq: Every day | ORAL | Status: DC | PRN

## 2015-09-04 MED ORDER — CEFAZOLIN SODIUM-DEXTROSE 2-3 GM-% IV SOLR
2.0000 g | Freq: Two times a day (BID) | INTRAVENOUS | Status: AC
  Administered 2015-09-05: 2 g via INTRAVENOUS
  Filled 2015-09-04 (×2): qty 50

## 2015-09-04 MED ORDER — METHOCARBAMOL 1000 MG/10ML IJ SOLN
500.0000 mg | Freq: Four times a day (QID) | INTRAVENOUS | Status: DC | PRN
  Filled 2015-09-04: qty 5

## 2015-09-04 MED ORDER — PROMETHAZINE HCL 25 MG/ML IJ SOLN
6.2500 mg | INTRAMUSCULAR | Status: DC | PRN
Start: 2015-09-04 — End: 2015-09-04

## 2015-09-04 MED ORDER — HYDROMORPHONE HCL 1 MG/ML IJ SOLN
1.0000 mg | INTRAMUSCULAR | Status: DC | PRN
  Administered 2015-09-04 (×2): 1 mg via INTRAVENOUS
  Administered 2015-09-05 (×2): 2 mg via INTRAVENOUS
  Administered 2015-09-06 (×2): 1 mg via INTRAVENOUS
  Filled 2015-09-04 (×4): qty 1
  Filled 2015-09-04 (×2): qty 2

## 2015-09-04 MED ORDER — OXYCODONE HCL 5 MG PO TABS
5.0000 mg | ORAL_TABLET | ORAL | Status: DC | PRN
  Administered 2015-09-04 – 2015-09-05 (×3): 10 mg via ORAL
  Filled 2015-09-04 (×3): qty 2

## 2015-09-04 MED ORDER — ROCURONIUM BROMIDE 100 MG/10ML IV SOLN
INTRAVENOUS | Status: DC | PRN
  Administered 2015-09-04: 50 mg via INTRAVENOUS

## 2015-09-04 MED ORDER — MENTHOL 3 MG MT LOZG: 1.0000 | LOZENGE | OROMUCOSAL | Status: DC | PRN

## 2015-09-04 MED ORDER — PHENYLEPHRINE HCL 10 MG/ML IJ SOLN
10.0000 mg | INTRAVENOUS | Status: DC | PRN
  Administered 2015-09-04: 25 ug/min via INTRAVENOUS

## 2015-09-04 MED ORDER — OXYCODONE-ACETAMINOPHEN 5-325 MG PO TABS: 1.0000 | ORAL_TABLET | ORAL | Status: DC | PRN

## 2015-09-04 MED ORDER — ONDANSETRON HCL 4 MG/2ML IJ SOLN
INTRAMUSCULAR | Status: DC | PRN
  Administered 2015-09-04: 4 mg via INTRAVENOUS

## 2015-09-04 MED ORDER — LIDOCAINE HCL (CARDIAC) 20 MG/ML IV SOLN
INTRAVENOUS | Status: DC | PRN
  Administered 2015-09-04: 100 mg via INTRAVENOUS

## 2015-09-04 MED ORDER — DOCUSATE SODIUM 100 MG PO CAPS
100.0000 mg | ORAL_CAPSULE | Freq: Two times a day (BID) | ORAL | Status: DC
  Administered 2015-09-04 – 2015-09-06 (×4): 100 mg via ORAL
  Filled 2015-09-04 (×4): qty 1

## 2015-09-04 MED ORDER — DIPHENHYDRAMINE HCL 25 MG PO CAPS: 25.0000 mg | ORAL_CAPSULE | Freq: Four times a day (QID) | ORAL | Status: DC | PRN

## 2015-09-04 MED ORDER — BUPIVACAINE-EPINEPHRINE (PF) 0.5% -1:200000 IJ SOLN
INTRAMUSCULAR | Status: DC | PRN
  Administered 2015-09-04: 15 mL via PERINEURAL

## 2015-09-04 MED ORDER — METHOCARBAMOL 500 MG PO TABS: 500.0000 mg | ORAL_TABLET | Freq: Three times a day (TID) | ORAL | Status: DC | PRN

## 2015-09-04 MED ORDER — MIDAZOLAM HCL 2 MG/2ML IJ SOLN: 2.0000 mg | Freq: Once | INTRAMUSCULAR | Status: DC

## 2015-09-04 MED ORDER — SODIUM CHLORIDE 0.9 % IV SOLN
INTRAVENOUS | Status: DC
Start: 2015-09-04 — End: 2015-09-06

## 2015-09-04 MED ORDER — METHOCARBAMOL 500 MG PO TABS
500.0000 mg | ORAL_TABLET | Freq: Four times a day (QID) | ORAL | Status: DC | PRN
  Administered 2015-09-04 – 2015-09-06 (×3): 500 mg via ORAL
  Filled 2015-09-04 (×3): qty 1

## 2015-09-04 MED ORDER — GLYCOPYRROLATE 0.2 MG/ML IJ SOLN
INTRAMUSCULAR | Status: DC | PRN
  Administered 2015-09-04: 0.4 mg via INTRAVENOUS

## 2015-09-04 MED ORDER — ONDANSETRON HCL 4 MG/2ML IJ SOLN
INTRAMUSCULAR | Status: AC
  Filled 2015-09-04: qty 2

## 2015-09-04 MED ORDER — OXYCODONE HCL 5 MG PO TABS: 5.0000 mg | ORAL_TABLET | Freq: Once | ORAL | Status: DC | PRN

## 2015-09-04 MED ORDER — FENTANYL CITRATE (PF) 100 MCG/2ML IJ SOLN
INTRAMUSCULAR | Status: AC
  Administered 2015-09-04: 50 ug via INTRAVENOUS
  Filled 2015-09-04: qty 2

## 2015-09-04 MED ORDER — ACETAMINOPHEN 325 MG PO TABS: 650.0000 mg | ORAL_TABLET | Freq: Four times a day (QID) | ORAL | Status: DC | PRN

## 2015-09-04 MED ORDER — BUSPIRONE HCL 10 MG PO TABS
10.0000 mg | ORAL_TABLET | Freq: Two times a day (BID) | ORAL | Status: DC
  Administered 2015-09-04 – 2015-09-06 (×4): 10 mg via ORAL
  Filled 2015-09-04 (×4): qty 1

## 2015-09-04 MED ORDER — LACTATED RINGERS IV SOLN
INTRAVENOUS | Status: DC
  Administered 2015-09-04: 10:00:00 via INTRAVENOUS

## 2015-09-04 MED ORDER — FENTANYL CITRATE (PF) 250 MCG/5ML IJ SOLN
INTRAMUSCULAR | Status: AC
  Filled 2015-09-04: qty 5

## 2015-09-04 MED ORDER — ONDANSETRON HCL 4 MG PO TABS
4.0000 mg | ORAL_TABLET | Freq: Four times a day (QID) | ORAL | Status: DC | PRN
Start: 2015-09-04 — End: 2015-09-06

## 2015-09-04 MED ORDER — SPIRONOLACTONE 100 MG PO TABS
100.0000 mg | ORAL_TABLET | Freq: Every day | ORAL | Status: DC
  Administered 2015-09-05 – 2015-09-06 (×2): 100 mg via ORAL
  Filled 2015-09-04 (×3): qty 1

## 2015-09-04 MED ORDER — LACTATED RINGERS IV SOLN
INTRAVENOUS | Status: DC | PRN
  Administered 2015-09-04: 11:00:00 via INTRAVENOUS

## 2015-09-04 MED ORDER — MIDAZOLAM HCL 2 MG/2ML IJ SOLN
INTRAMUSCULAR | Status: AC
  Administered 2015-09-04: 2 mg
  Filled 2015-09-04: qty 2

## 2015-09-04 MED ORDER — FENTANYL CITRATE (PF) 100 MCG/2ML IJ SOLN
50.0000 ug | Freq: Once | INTRAMUSCULAR | Status: AC
  Administered 2015-09-04: 50 ug via INTRAVENOUS

## 2015-09-04 MED ORDER — PROPOFOL 10 MG/ML IV BOLUS
INTRAVENOUS | Status: AC
  Filled 2015-09-04: qty 20

## 2015-09-04 MED ORDER — BISACODYL 10 MG RE SUPP: 10.0000 mg | Freq: Every day | RECTAL | Status: DC | PRN

## 2015-09-04 MED ORDER — PHENOL 1.4 % MT LIQD: 1.0000 | OROMUCOSAL | Status: DC | PRN

## 2015-09-04 MED ORDER — ONDANSETRON HCL 4 MG/2ML IJ SOLN: 4.0000 mg | Freq: Four times a day (QID) | INTRAMUSCULAR | Status: DC | PRN

## 2015-09-04 MED ORDER — PHENYLEPHRINE HCL 10 MG/ML IJ SOLN
INTRAMUSCULAR | Status: DC | PRN
  Administered 2015-09-04 (×2): 80 ug via INTRAVENOUS

## 2015-09-04 MED ORDER — HYDROMORPHONE HCL 1 MG/ML IJ SOLN: 0.2500 mg | INTRAMUSCULAR | Status: DC | PRN

## 2015-09-04 MED ORDER — OXYCODONE HCL 5 MG/5ML PO SOLN: 5.0000 mg | Freq: Once | ORAL | Status: DC | PRN

## 2015-09-04 MED ORDER — METOCLOPRAMIDE HCL 5 MG PO TABS: 5.0000 mg | ORAL_TABLET | Freq: Three times a day (TID) | ORAL | Status: DC | PRN

## 2015-09-04 MED ORDER — HYDROCODONE-ACETAMINOPHEN 5-325 MG PO TABS: 1.0000 | ORAL_TABLET | Freq: Two times a day (BID) | ORAL | Status: DC | PRN

## 2015-09-04 MED ORDER — FUROSEMIDE 40 MG PO TABS
40.0000 mg | ORAL_TABLET | Freq: Every day | ORAL | Status: DC
  Administered 2015-09-05 – 2015-09-06 (×2): 40 mg via ORAL
  Filled 2015-09-04 (×2): qty 1

## 2015-09-04 MED ORDER — METOCLOPRAMIDE HCL 5 MG/ML IJ SOLN
5.0000 mg | Freq: Three times a day (TID) | INTRAMUSCULAR | Status: DC | PRN
Start: 2015-09-04 — End: 2015-09-06

## 2015-09-04 MED ORDER — VITAMIN B-1 100 MG PO TABS
100.0000 mg | ORAL_TABLET | Freq: Every day | ORAL | Status: DC
  Administered 2015-09-05 – 2015-09-06 (×2): 100 mg via ORAL
  Filled 2015-09-04 (×2): qty 1

## 2015-09-04 MED ORDER — MIDAZOLAM HCL 2 MG/2ML IJ SOLN
INTRAMUSCULAR | Status: AC
  Filled 2015-09-04: qty 2

## 2015-09-04 MED ORDER — PROPRANOLOL HCL ER 80 MG PO CP24
80.0000 mg | ORAL_CAPSULE | Freq: Every day | ORAL | Status: DC
  Administered 2015-09-05 – 2015-09-06 (×2): 80 mg via ORAL
  Filled 2015-09-04 (×2): qty 1

## 2015-09-04 MED ORDER — ACETAMINOPHEN 650 MG RE SUPP: 650.0000 mg | Freq: Four times a day (QID) | RECTAL | Status: DC | PRN

## 2015-09-04 MED ORDER — BUPIVACAINE-EPINEPHRINE (PF) 0.25% -1:200000 IJ SOLN
INTRAMUSCULAR | Status: AC
  Filled 2015-09-04: qty 30

## 2015-09-04 SURGICAL SUPPLY — 60 items
BIT DRILL 4 LONG FAST STEP (BIT) ×2 IMPLANT
BIT DRILL SNP 4.0MM (BIT) IMPLANT
CLOSURE WOUND 1/2 X4 (GAUZE/BANDAGES/DRESSINGS) ×1
COVER SURGICAL LIGHT HANDLE (MISCELLANEOUS) ×3 IMPLANT
DRAPE IMP U-DRAPE 54X76 (DRAPES) ×3 IMPLANT
DRAPE INCISE IOBAN 66X45 STRL (DRAPES) ×6 IMPLANT
DRAPE U-SHAPE 47X51 STRL (DRAPES) ×3 IMPLANT
DRILL BIT SNP 4.0MM (BIT) ×2
DRSG ADAPTIC 3X8 NADH LF (GAUZE/BANDAGES/DRESSINGS) ×2 IMPLANT
DRSG EMULSION OIL 3X3 NADH (GAUZE/BANDAGES/DRESSINGS) ×3 IMPLANT
DRSG PAD ABDOMINAL 8X10 ST (GAUZE/BANDAGES/DRESSINGS) ×2 IMPLANT
DURAPREP 26ML APPLICATOR (WOUND CARE) ×3 IMPLANT
ELECT REM PT RETURN 9FT ADLT (ELECTROSURGICAL) ×3
ELECTRODE REM PT RTRN 9FT ADLT (ELECTROSURGICAL) ×1 IMPLANT
GAUZE SPONGE 4X4 12PLY STRL (GAUZE/BANDAGES/DRESSINGS) ×3 IMPLANT
GLOVE BIOGEL PI ORTHO PRO 7.5 (GLOVE) ×2
GLOVE BIOGEL PI ORTHO PRO SZ8 (GLOVE) ×2
GLOVE ORTHO TXT STRL SZ7.5 (GLOVE) ×3 IMPLANT
GLOVE PI ORTHO PRO STRL 7.5 (GLOVE) ×1 IMPLANT
GLOVE PI ORTHO PRO STRL SZ8 (GLOVE) ×1 IMPLANT
GLOVE SURG ORTHO 8.5 STRL (GLOVE) ×3 IMPLANT
GOWN STRL REUS W/ TWL XL LVL3 (GOWN DISPOSABLE) ×2 IMPLANT
GOWN STRL REUS W/TWL XL LVL3 (GOWN DISPOSABLE) ×6
K-WIRE 2X5 SS THRDED S3 (WIRE) ×6
KIT BASIN OR (CUSTOM PROCEDURE TRAY) ×3 IMPLANT
KIT ROOM TURNOVER OR (KITS) ×3 IMPLANT
KWIRE 2X5 SS THRDED S3 (WIRE) IMPLANT
MANIFOLD NEPTUNE II (INSTRUMENTS) ×3 IMPLANT
NDL HYPO 25GX1X1/2 BEV (NEEDLE) IMPLANT
NEEDLE HYPO 25GX1X1/2 BEV (NEEDLE) IMPLANT
NS IRRIG 1000ML POUR BTL (IV SOLUTION) ×3 IMPLANT
PACK SHOULDER (CUSTOM PROCEDURE TRAY) ×3 IMPLANT
PACK UNIVERSAL I (CUSTOM PROCEDURE TRAY) ×3 IMPLANT
PAD ABD 8X10 STRL (GAUZE/BANDAGES/DRESSINGS) ×3 IMPLANT
PAD ARMBOARD 7.5X6 YLW CONV (MISCELLANEOUS) ×6 IMPLANT
PEG STND 4.0X35MM (Orthopedic Implant) ×3 IMPLANT
PEG STND 4.0X40MM (Orthopedic Implant) ×6 IMPLANT
PEG STND 4.0X42.5MM (Orthopedic Implant) ×6 IMPLANT
PEGSTD 4.0X35MM (Orthopedic Implant) IMPLANT
PEGSTD 4.0X40MM (Orthopedic Implant) IMPLANT
PEGSTD 4.0X42.5MM (Orthopedic Implant) IMPLANT
PLATE SZ3 SHOULDER NAIL SNP (Plate) ×2 IMPLANT
SCREW SNP UNICORTICAL (Screw) ×4 IMPLANT
SLING ARM FOAM STRAP LRG (SOFTGOODS) ×2 IMPLANT
SPONGE GAUZE 4X4 12PLY STER LF (GAUZE/BANDAGES/DRESSINGS) ×2 IMPLANT
SPONGE LAP 4X18 X RAY DECT (DISPOSABLE) ×6 IMPLANT
STRIP CLOSURE SKIN 1/2X4 (GAUZE/BANDAGES/DRESSINGS) ×2 IMPLANT
SUCTION FRAZIER TIP 10 FR DISP (SUCTIONS) ×3 IMPLANT
SUT FIBERWIRE #2 38 T-5 BLUE (SUTURE)
SUT MNCRL AB 4-0 PS2 18 (SUTURE) ×3 IMPLANT
SUT VIC AB 0 CT1 27 (SUTURE) ×3
SUT VIC AB 0 CT1 27XBRD ANBCTR (SUTURE) ×1 IMPLANT
SUT VIC AB 2-0 CT1 27 (SUTURE) ×6
SUT VIC AB 2-0 CT1 TAPERPNT 27 (SUTURE) ×2 IMPLANT
SUTURE FIBERWR #2 38 T-5 BLUE (SUTURE) IMPLANT
SYR CONTROL 10ML LL (SYRINGE) ×3 IMPLANT
TAPE PAPER 3X10 WHT MICROPORE (GAUZE/BANDAGES/DRESSINGS) ×2 IMPLANT
TOWEL OR 17X24 6PK STRL BLUE (TOWEL DISPOSABLE) ×3 IMPLANT
TOWEL OR 17X26 10 PK STRL BLUE (TOWEL DISPOSABLE) ×3 IMPLANT
WATER STERILE IRR 1000ML POUR (IV SOLUTION) ×3 IMPLANT

## 2015-09-04 NOTE — Brief Op Note (Signed)
09/04/2015  12:41 PM  PATIENT:  Wesley Burns  49 y.o. male  PRE-OPERATIVE DIAGNOSIS:  LEFT DISPLACED PROXIMAL HUMERUS FRACTURE  POST-OPERATIVE DIAGNOSIS:  LEFT DISPLACED PROXIMAL HUMERUS FRACTURE  PROCEDURE:  Procedure(s): OPEN REDUCTION INTERNAL FIXATION (ORIF) LEFT PROXIMAL HUMERUS FRACTURE (Left) BIOMET SNP NAIL PLATE  SURGEON:  Surgeon(s) and Role:    * Beverely Low, MD - Primary  PHYSICIAN ASSISTANT:   ASSISTANTS: Thea Gist, PA-C   ANESTHESIA:   regional and general  EBL:     BLOOD ADMINISTERED:none  DRAINS: none   LOCAL MEDICATIONS USED:  MARCAINE     SPECIMEN:  No Specimen  DISPOSITION OF SPECIMEN:  N/A  COUNTS:  YES  TOURNIQUET:  * No tourniquets in log *  DICTATION: .Other Dictation: Dictation Number [id number]  PLAN OF CARE: Admit to inpatient   PATIENT DISPOSITION:  PACU - hemodynamically stable.   Delay start of Pharmacological VTE agent (>24hrs) due to surgical blood loss or risk of bleeding: not applicable

## 2015-09-04 NOTE — Discharge Instructions (Signed)
Do not lift push or pull with the left shoulder, only gentle lap slides and rotation as allowed and instructed by home health Occupational therapy.  No WB with either UE  Keep incision clean and dry and covered for one week, then ok to get wet in the shower.  Follow up with Dr Ranell Patrick in the office for the shoulder in two weeks  [phone]  Use sling while up, may remove if seated and rest on a pillow

## 2015-09-04 NOTE — Op Note (Signed)
Wesley Burns, Wesley Burns              ACCOUNT NO.:  [id number]  MEDICAL RECORD NO.:  [id number]  LOCATION:  5N30C                        FACILITY:  [facility]  PHYSICIAN:  Almedia Balls. Ranell Patrick, M.D. DATE OF BIRTH:  [birth year]  DATE OF PROCEDURE:  09/04/2015 DATE OF DISCHARGE:                              OPERATIVE REPORT   PREOPERATIVE DIAGNOSIS:  Left displaced proximal humerus fracture.  POSTOPERATIVE DIAGNOSIS:  Left displaced proximal humerus fracture.  PROCEDURE PERFORMED:  Open reduction and internal fixation of left displaced proximal humerus fracture using Biomet SNP nail plate.  ATTENDING SURGEON:  Almedia Balls. Ranell Patrick, MD.  ASSISTANT:  Donnie Coffin. Dixon, PA-C, who scrubbed the entire procedure and was necessary for the satisfactory completion of the surgery.  ANESTHESIA:  General anesthesia was used plus interscalene block.  ESTIMATED BLOOD LOSS:  Minimal.  FLUID REPLACEMENT:  1500 mL crystalloid.  INSTRUMENT COUNTS:  Correct.  COMPLICATIONS:  There were no complications.  ANTIBIOTICS:  Perioperative antibiotics were given.  INDICATIONS:  The patient is a 49 year old male, who presents with a history of displaced left proximal humerus fracture, sustained at the same time he had a broken right wrist.  The right wrist was managed by my partner, Dr. Darrelyn Hillock who has asked me to manage the displaced proximal humerus fracture.  Given the bilateral injuries, we recommended surgical management for this significantly displaced fracture with malalignment.  Risks and benefits of the surgery were discussed. Informed consent was obtained.  DESCRIPTION OF PROCEDURE:  After an adequate level of anesthesia had been achieved, the patient was positioned supine on the operative table. Left arm was correctly identified, sterilely prepped and draped in usual manner.  Time-out was called.  We entered the shoulder using an anterior deltopectoral approach starting at the coracoid process extending  down to the anterior humerus.  Dissection down through subcutaneous tissues using Bovie electrocautery, we identified the cephalic vein, took it laterally of the deltoid, pectoralis taken medially.  The fracture site identified.  There was significant comminution, several large loose fragments of bone were completely free and removed as they were interfering the fracture reduction.  Using a combination of traction and manual reduction, we were able to reduce the fracture appropriately. There was a huge bony defect present in the proximal humerus.  We felt like for stability purposes that we could get the overall alignment and rotation correct and essentially piston the humeral shaft diaphysis up into the humeral epiphysis of the head component, that we could gain some stability, maintained appropriate alignment but this would give [place] a lot better chance of this fracture actually healing, so we actually got bone-on-bone to help support itself, placed our central guide pin with the DePuy nail plate, positioned appropriately behind the bicipital groove.  The biceps appeared to have been ruptured for sometime and then auto-tenodesed, but we were able to locate the biceps groove and then put the nail plate just posterior to that.  We were happy with the central guide pin and placement.  We then filled five threaded smooth pegs that were locked into the plate and then two monocortical screws distally through the jig.  We were extremely pleased with our soft tissue alignment and  our bony stability.  We were able to rotate everything which moved together as a unit.  We did note there to be a subscap attachment inferiorly, the inferior portion appeared to have attached to one of those pieces of bone that had come out.  So, we took that subscap and then tied it back into position essentially against the medial calcar of the medial bone and then tied that around the back to teres minor and then the  same for the upper part of the subscap, a mattress stitch around to the infraspinatus.  We were able to really compress down the tuberosities to further assist in achieving proximal humeral stability.  We ranged everything which moved together as a unit. We irrigated thoroughly, closed the deltopectoral interval with 0 Vicryl suture, followed by 2-0 Vicryl suture for subcu, and 4-0 Monocryl for skin.  Standley Dakins, PA-C scrubbed the entire procedure and necessary for the satisfactory completion of the surgery.     Almedia Balls. Ranell Patrick, M.D.     SRN/MEDQ  D:  09/04/2015  T:  09/04/2015  Job:  [id number]

## 2015-09-04 NOTE — Anesthesia Procedure Notes (Addendum)
Procedure Name: Intubation Date/Time: 09/04/2015 10:56 AM Performed by: Gavin Pound, HAL J Pre-anesthesia Checklist: Patient identified, Emergency Drugs available, Suction available, Patient being monitored and Timeout performed Patient Re-evaluated:Patient Re-evaluated prior to inductionOxygen Delivery Method: Circle system utilized Preoxygenation: Pre-oxygenation with 100% oxygen Intubation Type: IV induction Ventilation: Mask ventilation without difficulty Laryngoscope Size: Mac and 4 Grade View: Grade I Tube type: Oral Tube size: 7.0 mm Number of attempts: 1 Placement Confirmation: ETT inserted through vocal cords under direct vision,  positive ETCO2 and breath sounds checked- equal and bilateral Secured at: 7 cm Tube secured with: Tape Dental Injury: Teeth and Oropharynx as per pre-operative assessment    Anesthesia Regional Block:  Interscalene brachial plexus block  Pre-Anesthetic Checklist: ,, timeout performed, Correct Patient, Correct Site, Correct Laterality, Correct Procedure, Correct Position, site marked, Risks and benefits discussed,  Surgical consent,  Pre-op evaluation,  At surgeon's request and post-op pain management  Laterality: Left  Prep: chloraprep       Needles:  Injection technique: Single-shot  Needle Type: Echogenic Stimulator Needle     Needle Length: 4cm 4 cm Needle Gauge: 21 and 21 G    Additional Needles:  Procedures: ultrasound guided (picture in chart) Interscalene brachial plexus block Narrative:  Injection made incrementally with aspirations every 5 mL.  Performed by: Personally  Anesthesiologist: Raffael Bugarin  Additional Notes: Risks, benefits and alternative to block explained extensively.  Patient tolerated procedure well, without complications.

## 2015-09-04 NOTE — Anesthesia Postprocedure Evaluation (Signed)
  Anesthesia Post-op Note  Patient: Wesley Burns  Procedure(s) Performed: Procedure(s) (LRB): OPEN REDUCTION INTERNAL FIXATION (ORIF) LEFT PROXIMAL HUMERUS FRACTURE (Left)  Patient Location: PACU  Anesthesia Type: General and Regional  Level of Consciousness: awake and alert   Airway and Oxygen Therapy: Patient Spontanous Breathing  Post-op Pain: mild  Post-op Assessment: Post-op Vital signs reviewed, Patient's Cardiovascular Status Stable, Respiratory Function Stable, Patent Airway and No signs of Nausea or vomiting  Last Vitals:  Filed Vitals:   09/04/15 1315  BP: 161/85  Pulse: 80  Temp:   Resp: 21    Post-op Vital Signs: stable   Complications: No apparent anesthesia complications

## 2015-09-04 NOTE — Transfer of Care (Signed)
Immediate Anesthesia Transfer of Care Note  Patient: Wesley Burns  Procedure(s) Performed: Procedure(s): OPEN REDUCTION INTERNAL FIXATION (ORIF) LEFT PROXIMAL HUMERUS FRACTURE (Left)  Patient Location: PACU  Anesthesia Type:General  Level of Consciousness: awake  Airway & Oxygen Therapy: Patient Spontanous Breathing and Patient connected to face mask oxygen  Post-op Assessment: Report given to RN and Post -op Vital signs reviewed and stable  Post vital signs: Reviewed and stable  Last Vitals:  Filed Vitals:   09/04/15 1025  BP:   Pulse: 70  Temp:   Resp: 13    Complications: No apparent anesthesia complications

## 2015-09-04 NOTE — Anesthesia Preprocedure Evaluation (Addendum)
Anesthesia Evaluation  Patient identified by MRN, date of birth, ID band Patient awake    Reviewed: Allergy & Precautions, H&P , NPO status , Patient's Chart, lab work & pertinent test results  History of Anesthesia Complications Negative for: history of anesthetic complications  Airway Mallampati: II  TM Distance: >3 FB Neck ROM: full    Dental no notable dental hx.    Pulmonary Current Smoker,    Pulmonary exam normal breath sounds clear to auscultation       Cardiovascular hypertension, Pt. on medications Normal cardiovascular exam Rhythm:regular Rate:Normal  Echo 2016 - EF normal, no valve abnormalities   Neuro/Psych Anxiety Depression negative neurological ROS     GI/Hepatic GERD  ,(+) Cirrhosis   ascites  substance abuse  alcohol use, EtOH   Endo/Other  diabetesDM history is on chart but last HgA1C was <5.0 so unsure of diagnosis, also takes no oral medication or insulin  Renal/GU Renal disease     Musculoskeletal   Abdominal   Peds  Hematology  (+) anemia ,   Anesthesia Other Findings   Reproductive/Obstetrics negative OB ROS                            Anesthesia Physical Anesthesia Plan  ASA: III  Anesthesia Plan: General and Regional   Post-op Pain Management: GA combined w/ Regional for post-op pain   Induction: Intravenous  Airway Management Planned: Oral ETT  Additional Equipment:   Intra-op Plan:   Post-operative Plan: Extubation in OR  Informed Consent: I have reviewed the patients History and Physical, chart, labs and discussed the procedure including the risks, benefits and alternatives for the proposed anesthesia with the patient or authorized representative who has indicated his/her understanding and acceptance.   Dental Advisory Given  Plan Discussed with: Anesthesiologist, CRNA and Surgeon  Anesthesia Plan Comments: (IS + GA with ETT)         Anesthesia Quick Evaluation

## 2015-09-04 NOTE — Interval H&P Note (Signed)
History and Physical Interval Note:  09/04/2015 10:21 AM  Wesley Burns  has presented today for surgery, with the diagnosis of LEFT PROXIMAL HUMERUS FRACTURE  The various methods of treatment have been discussed with the patient and family. After consideration of risks, benefits and other options for treatment, the patient has consented to  Procedure(s): OPEN REDUCTION INTERNAL FIXATION (ORIF) LEFT PROXIMAL HUMERUS FRACTURE (Left) as a surgical intervention .  The patient's history has been reviewed, patient examined, no change in status, stable for surgery.  I have reviewed the patient's chart and labs.  Questions were answered to the patient's satisfaction.     Oswaldo Cueto,STEVEN R

## 2015-09-05 ENCOUNTER — Encounter (HOSPITAL_COMMUNITY): Payer: Self-pay | Admitting: General Practice

## 2015-09-05 LAB — BASIC METABOLIC PANEL
ANION GAP: 12 (ref 5–15)
BUN: 74 mg/dL — AB (ref 6–20)
CHLORIDE: 106 mmol/L (ref 101–111)
CO2: 12 mmol/L — ABNORMAL LOW (ref 22–32)
Calcium: 9.2 mg/dL (ref 8.9–10.3)
Creatinine, Ser: 3.05 mg/dL — ABNORMAL HIGH (ref 0.61–1.24)
GFR calc Af Amer: 26 mL/min — ABNORMAL LOW (ref >60)
GFR calc non Af Amer: 22 mL/min — ABNORMAL LOW (ref >60)
GLUCOSE: 94 mg/dL (ref 65–99)
POTASSIUM: 5.5 mmol/L — AB (ref 3.5–5.1)
Sodium: 130 mmol/L — ABNORMAL LOW (ref 135–145)

## 2015-09-05 LAB — ABO/RH: ABO/RH(D): A NEG

## 2015-09-05 LAB — PREPARE RBC (CROSSMATCH)

## 2015-09-05 LAB — HEMOGLOBIN AND HEMATOCRIT, BLOOD
HCT: 20.8 % — ABNORMAL LOW (ref 39.0–52.0)
Hemoglobin: 7.1 g/dL — ABNORMAL LOW (ref 13.0–17.0)

## 2015-09-05 MED ORDER — SODIUM CHLORIDE 0.9 % IV SOLN: Freq: Once | INTRAVENOUS | Status: DC

## 2015-09-05 NOTE — Evaluation (Signed)
Occupational Therapy Evaluation Patient Details Name: Wesley Burns MRN: [id number] DOB: 01/23/1966 Today's Date: 09/05/2015    History of Present Illness The patient is a 49 year old male, who presents with a history of displaced left proximal humerus fracture, sustained at the same time he had a broken right wrist. S/p OPEN REDUCTION INTERNAL FIXATION (ORIF) LEFT PROXIMAL HUMERUS FRACTURE (Left)    Clinical Impression   Pt reports that he required min assist for tub transfers but was otherwise independent in ADLs PTA. Currently pt is mod-max assist overall with ADLs. Educated pt on sling management, UE positioning in bed, and edema management. Pt provided with shoulder handout, will check back later to review handout and practice exercises/ADL. Pt to d/c home but is concerned because his partner will be out of town for a few days and he is having difficulty finding someone to come stay with him. Pt would benefit from continued skilled OT in order to increase independence with ADLs and UE exercises required for safe d/c home.     Follow Up Recommendations  Home health OT;Supervision/Assistance - 24 hour    Equipment Recommendations  None recommended by OT    Recommendations for Other Services PT consult     Precautions / Restrictions Precautions Precautions: Shoulder;Fall Type of Shoulder Precautions: Conservative protocol: gentle lap slides, assisted rotation, elbow/wrist/hand AROM OK  Shoulder Interventions: Shoulder sling/immobilizer;At all times;Off for dressing/bathing/exercises Precaution Booklet Issued: Yes (comment) Precaution Comments: Educated pt on shoulder precautions and provided handout Required Braces or Orthoses: Sling Restrictions Weight Bearing Restrictions: Yes LUE Weight Bearing: Non weight bearing      Mobility Bed Mobility               General bed mobility comments: Bed mobility not assessed at this time  Transfers                  General transfer comment: Transfers not assessed at this time    Balance                                            ADL Overall ADL's : Needs assistance/impaired                                       General ADL Comments: No family present during OT eval. Pt is overall mod A for ADLs at this time. Pt reports that he lives with his partner who helped some with ADLs PTA. Pt does not think his partner will be available to assist upon d/c home for a few days due to a planned trip out of town. Pt says that his partner has tried to contact relatives to come stay and so far no one can. Educated pt on positioning of UE in sling, position of UE in bed, ice on for edema and pain management; pt verbalized understanding. Will check back later for ADLs and exercises.     Vision     Perception     Praxis      Pertinent Vitals/Pain Pain Assessment: 0-10 Pain Score: 7  Pain Location: L shoulder, R wrist Pain Descriptors / Indicators: Grimacing;Guarding;Sore Pain Intervention(s): Limited activity within patient's tolerance;Monitored during session;Repositioned;Ice applied     Hand Dominance Right   Extremity/Trunk Assessment Upper Extremity Assessment Upper Extremity Assessment: RUE  deficits/detail;LUE deficits/detail RUE Deficits / Details: shoulder at least 3+/5 RUE: Unable to fully assess due to pain;Unable to fully assess due to immobilization LUE Deficits / Details: wrist, hand at least 3+/5 LUE: Unable to fully assess due to immobilization;Unable to fully assess due to pain   Lower Extremity Assessment Lower Extremity Assessment: Defer to PT evaluation       Communication Communication Communication: No difficulties   Cognition Arousal/Alertness: Lethargic Behavior During Therapy: WFL for tasks assessed/performed Overall Cognitive Status: Within Functional Limits for tasks assessed                     General Comments        Exercises Exercises: Shoulder     Shoulder Instructions Shoulder Instructions Donning/doffing sling/immobilizer: Maximal assistance Correct positioning of sling/immobilizer: Maximal assistance Sling wearing schedule (on at all times/off for ADL's): Maximal assistance Proper positioning of operated UE when showering: Maximal assistance Positioning of UE while sleeping: Maximal assistance    Home Living Family/patient expects to be discharged to:: Private residence Living Arrangements: Spouse/significant other Available Help at Discharge: Family;Other (Comment) (partner not available for a few days upon d/c) Type of Home: House Home Access: Stairs to enter [employer] of Steps: 2 Entrance Stairs-Rails: None Home Layout: Multi-level;Able to live on main level with bedroom/bathroom Alternate Level Stairs-Number of Steps: 16 Alternate Level Stairs-Rails: Can reach both Bathroom Shower/Tub: Chief Strategy Officer: Standard Bathroom Accessibility: Yes How Accessible: Accessible via walker Home Equipment: Shower seat;Bedside commode;Walker - 2 wheels;Cane - quad;Walker - standard   Additional Comments: pt reports he has a young dog at home and is worried about the dog getting under his feet while walking around the house      Prior Functioning/Environment Level of Independence: Needs assistance  Gait / Transfers Assistance Needed: uses AD to get around, history of orthpedic issues (hip fx, hip dislocations x5, mid foot amputation, charcot bilaterally) ADL's / Homemaking Assistance Needed: Pt reports that his partner would help him get in and out of the bath        OT Diagnosis: Generalized weakness;Acute pain   OT Problem List: Decreased strength;Decreased range of motion;Impaired balance (sitting and/or standing);Decreased safety awareness;Decreased knowledge of use of DME or AE;Decreased knowledge of precautions;Pain   OT Treatment/Interventions:  Self-care/ADL training;Therapeutic exercise;DME and/or AE instruction;Patient/family education    OT Goals(Current goals can be found in the care plan section) Acute Rehab OT Goals Patient Stated Goal: to get better OT Goal Formulation: With patient Time For Goal Achievement: 09/19/15 Potential to Achieve Goals: Good ADL Goals Pt Will Perform Eating: with min assist;with adaptive utensils;sitting Pt Will Perform Grooming: with min assist;standing Pt Will Perform Upper Body Bathing: with min assist;sitting Pt Will Perform Lower Body Bathing: with min assist;with adaptive equipment;sit to/from stand Pt Will Perform Upper Body Dressing: with min assist;sitting Pt Will Perform Lower Body Dressing: with min assist;with adaptive equipment;sit to/from stand Pt/caregiver will Perform Home Exercise Program: Increased ROM;Both right and left upper extremity;Independently;With written HEP provided  OT Frequency: Min 3X/week   Barriers to D/C: Decreased caregiver support          Co-evaluation              End of Session Equipment Utilized During Treatment: Other (comment) (sling)  Activity Tolerance: Patient limited by pain;Patient limited by lethargy Patient left: in bed;with call bell/phone within reach   Time: 0837-0905 OT Time Calculation (min): 28 min Charges:  OT General Charges $OT  Visit: 1 Procedure OT Evaluation $Initial OT Evaluation Tier I: 1 Procedure OT Treatments $Self Care/Home Management : 8-22 mins G-Codes:     Gaye Alken M.S., OTR/L Pager: [phone]  09/05/2015, 9:32 AM

## 2015-09-05 NOTE — Care Management Note (Signed)
Case Management Note  Patient Details  Name: Wesley Burns MRN: [id number] Date of Birth: 06/16/1966  Subjective/Objective:           S/p left humerus ORIF, right wrist ORIF         Action/Plan: Spoke with patient and his partner about HHC, they selected [facility] which he has worked with in the past. Gave patient's partner a list of private duty agencies to contact for aides to stay with the patient while he is out of town. Contacted Edwina at [facility] and set up HHPT and HHOT. No equipment needs identified by OT.   Expected Discharge Date:                  Expected Discharge Plan:  Home w Home Health Services  In-House Referral:  NA  Discharge planning Services  CM Consult  Post Acute Care Choice:  Home Health Choice offered to:  Patient  DME Arranged:    DME Agency:     HH Arranged:  PT, OT HH Agency:  [facility]  Status of Service:  In process, will continue to follow  Medicare Important Message Given:    Date Medicare IM Given:    Medicare IM give by:    Date Additional Medicare IM Given:    Additional Medicare Important Message give by:     If discussed at Long Length of Stay Meetings, dates discussed:    Additional Comments:  Monica Becton, RN 09/05/2015, 2:10 PM

## 2015-09-05 NOTE — Progress Notes (Signed)
Patient only voiding scant amount at a time between 50-160mls. Patient still complains of feeling full after voiding. Patient voided out . Post void residual on the Bladder scan showed >999. In and out cath done at 0130 got out of urine. Will continue to monitor.

## 2015-09-05 NOTE — Progress Notes (Signed)
Occupational Therapy Treatment Patient Details Name: Wesley Burns MRN: [id number] DOB: 06/18/1966 Today's Date: 09/05/2015    History of present illness The patient is a 49 year old male, who presents with a history of displaced left proximal humerus fracture, sustained at the same time he had a broken right wrist. S/p OPEN REDUCTION INTERNAL FIXATION (ORIF) LEFT PROXIMAL HUMERUS FRACTURE (Left)    OT comments  Pt making slow progress toward OT goals. Educated pt and pts partner on UB bathing/dressing technique, NWB status/precautions, sling management and wear schedule, LUE positioning, and safety during ADLs. Educated and practiced B UE exercises. Pt with difficulty adhering to shoulder precautions during exercises and functional activities. Pt with difficulty feeding self, provided pt with built up handle for utensil; pt demonstrated use and understanding. Continue to follow pt acutely.     Follow Up Recommendations  Home health OT;Supervision/Assistance - 24 hour    Equipment Recommendations  None recommended by OT    Recommendations for Other Services PT consult    Precautions / Restrictions Precautions Precautions: Shoulder;Fall Type of Shoulder Precautions: Conservative protocol: gentle lap slides, assisted rotation, elbow/wrist/hand AROM OK  Shoulder Interventions: Shoulder sling/immobilizer;At all times;Off for dressing/bathing/exercises Precaution Booklet Issued: Yes (comment) Precaution Comments: Reviewed precaution handout Required Braces or Orthoses: Sling Restrictions Weight Bearing Restrictions: Yes LUE Weight Bearing: Non weight bearing       Mobility Bed Mobility Overal bed mobility: Modified Independent             General bed mobility comments: Verbal cues for maintaining NWB of LUE  Transfers Overall transfer level: Needs assistance   Transfers: Sit to/from Stand Sit to Stand: Min assist         General transfer comment: Hand held assist  for sit <> stand    Balance Overall balance assessment: Needs assistance         Standing balance support: Single extremity supported Standing balance-Leahy Scale: Poor Standing balance comment: Pt relies on mod hand held assist for balance                   ADL Overall ADL's : Needs assistance/impaired Eating/Feeding: Set up;Sitting;With adaptive utensils Eating/Feeding Details (indicate cue type and reason): Provided pt with built up handle for utensils. Pt demonstrated ability to feed self using adapted utensils                     Toilet Transfer: Moderate assistance (standing with urinal )           Functional mobility during ADLs: Minimal assistance General ADL Comments: Pts partner present for OT session. Educated pt and his partner on UB dressing/bathing technique, precautions/NWB, sling management and wear schedule, and exercises. Pt and pts partner verbalize understanding.      Vision                     Perception     Praxis      Cognition   Behavior During Therapy: Restless Overall Cognitive Status: Within Functional Limits for tasks assessed       Memory: Decreased recall of precautions;Decreased short-term memory               Extremity/Trunk Assessment  Upper Extremity Assessment Upper Extremity Assessment: RUE deficits/detail;LUE deficits/detail RUE Deficits / Details: shoulder at least 3+/5 RUE: Unable to fully assess due to pain;Unable to fully assess due to immobilization LUE Deficits / Details: wrist, hand at least 3+/5 LUE: Unable to fully assess due  to immobilization;Unable to fully assess due to pain   Lower Extremity Assessment Lower Extremity Assessment: Defer to PT evaluation        Exercises Shoulder Exercises Shoulder Flexion: AROM;Right;10 reps;Supine (HOB elevated) Elbow Flexion: AROM;Both;10 reps;Supine (HOB elevated) Wrist Flexion: AROM;Left;10 reps;Supine (HOB elevated) Digit Composite  Flexion: AROM;Both;10 reps;Supine (HOB elevated ) Donning/doffing shirt without moving shoulder: Maximal assistance Method for sponge bathing under operated UE: Maximal assistance Donning/doffing sling/immobilizer: Maximal assistance Correct positioning of sling/immobilizer: Maximal assistance;Caregiver independent with task ROM for elbow, wrist and digits of operated UE: Maximal assistance Sling wearing schedule (on at all times/off for ADL's): Maximal assistance Proper positioning of operated UE when showering: Maximal assistance;Caregiver independent with task Positioning of UE while sleeping: Maximal assistance;Caregiver independent with task   Shoulder Instructions Shoulder Instructions Donning/doffing shirt without moving shoulder: Maximal assistance Method for sponge bathing under operated UE: Maximal assistance Donning/doffing sling/immobilizer: Maximal assistance Correct positioning of sling/immobilizer: Maximal assistance;Caregiver independent with task ROM for elbow, wrist and digits of operated UE: Maximal assistance Sling wearing schedule (on at all times/off for ADL's): Maximal assistance Proper positioning of operated UE when showering: Maximal assistance;Caregiver independent with task Positioning of UE while sleeping: Maximal assistance;Caregiver independent with task     General Comments      Pertinent Vitals/ Pain       Pain Assessment: Faces Pain Score: 7  Faces Pain Scale: Hurts even more Pain Location: L shoulder Pain Descriptors / Indicators: Grimacing;Guarding Pain Intervention(s): Limited activity within patient's tolerance;Monitored during session;Repositioned;Ice applied  Home Living Family/patient expects to be discharged to:: Private residence Living Arrangements: Spouse/significant other Available Help at Discharge: Family;Other (Comment) (partner not available for a few days upon d/c) Type of Home: House Home Access: Stairs to enter [employer] of Steps: 2 Entrance Stairs-Rails: None Home Layout: Multi-level;Able to live on main level with bedroom/bathroom Alternate Level Stairs-Number of Steps: 16 Alternate Level Stairs-Rails: Can reach both Bathroom Shower/Tub: Chief Strategy Officer: Standard Bathroom Accessibility: Yes How Accessible: Accessible via walker Home Equipment: Shower seat;Bedside commode;Walker - 2 wheels;Cane - quad;Walker - standard   Additional Comments: pt reports he has a young dog at home and is worried about the dog getting under his feet while walking around the house      Prior Functioning/Environment Level of Independence: Needs assistance  Gait / Transfers Assistance Needed: uses AD to get around, history of orthpedic issues (hip fx, hip dislocations x5, mid foot amputation, charcot bilaterally) ADL's / Homemaking Assistance Needed: Pt reports that his partner would help him get in and out of the bath       Frequency Min 3X/week     Progress Toward Goals  OT Goals(current goals can now be found in the care plan section)  Progress towards OT goals: Progressing toward goals  Acute Rehab OT Goals Patient Stated Goal: none stated  OT Goal Formulation: With patient Time For Goal Achievement: 09/19/15 Potential to Achieve Goals: Good ADL Goals Pt Will Perform Eating: with min assist;with adaptive utensils;sitting Pt Will Perform Grooming: with min assist;standing Pt Will Perform Upper Body Bathing: with min assist;sitting Pt Will Perform Lower Body Bathing: with min assist;with adaptive equipment;sit to/from stand Pt Will Perform Upper Body Dressing: with min assist;sitting Pt Will Perform Lower Body Dressing: with min assist;with adaptive equipment;sit to/from stand Pt/caregiver will Perform Home Exercise Program: Increased ROM;Both right and left upper extremity;Independently;With written HEP provided  Plan Discharge plan remains appropriate    Co-evaluation  End of Session Equipment Utilized During Treatment: Gait belt   Activity Tolerance Patient limited by pain;Patient limited by lethargy   Patient Left in chair;with call bell/phone within reach;with family/visitor present   Nurse Communication Mobility status;Precautions;Weight bearing status        Time: 4008-6761 OT Time Calculation (min): 30 min  Charges: OT General Charges $OT Visit: 1 Procedure OT Evaluation $Initial OT Evaluation Tier I: 1 Procedure OT Treatments $Self Care/Home Management : 8-22 mins $Therapeutic Exercise: 8-22 mins  Gaye Alken M.S., OTR/L Pager: [phone]  09/05/2015, 12:29 PM

## 2015-09-05 NOTE — Progress Notes (Signed)
Utilization review completed.  

## 2015-09-05 NOTE — Progress Notes (Signed)
   Subjective: 1 Day Post-Op Procedure(s) (LRB): OPEN REDUCTION INTERNAL FIXATION (ORIF) LEFT PROXIMAL HUMERUS FRACTURE (Left)  Pt doing fairly well Mild to moderate soreness in the left shoulder Currently working on home health aide to be started tomorro Patient reports pain as moderate.  Objective:   VITALS:   Filed Vitals:   09/05/15 1300  BP: 161/58  Pulse: 86  Temp:   Resp: 16    Left shoulder dressing and sling intact  nv intact distally No rashes or edema  LABS  Recent Labs  09/04/15 1007 09/05/15 0533  HGB 8.1* 7.1*  HCT 24.1* 20.8*  WBC 10.1  --   PLT 454*  --      Recent Labs  09/04/15 1007 09/05/15 0533  NA 134* 130*  K 5.5* 5.5*  BUN 90* 74*  CREATININE 4.12* 3.05*  GLUCOSE 109* 94     Assessment/Plan: 1 Day Post-Op Procedure(s) (LRB): OPEN REDUCTION INTERNAL FIXATION (ORIF) LEFT PROXIMAL HUMERUS FRACTURE (Left) Acute blood loss anemia - will transfuse 2 units today Will work on d/c planning possibly today vs tomorrow Pain management     Alphonsa Overall, MPAS, PA-C  09/05/2015, 1:24 PM

## 2015-09-06 LAB — TYPE AND SCREEN
ABO/RH(D): A NEG
ANTIBODY SCREEN: NEGATIVE
UNIT DIVISION: 0
UNIT DIVISION: 0

## 2015-09-06 LAB — CBC WITH DIFFERENTIAL/PLATELET
Basophils Absolute: 0 10*3/uL (ref 0.0–0.1)
Basophils Relative: 0 %
EOS ABS: 0.3 10*3/uL (ref 0.0–0.7)
EOS PCT: 3 %
HCT: 27.2 % — ABNORMAL LOW (ref 39.0–52.0)
HEMOGLOBIN: 9.5 g/dL — AB (ref 13.0–17.0)
LYMPHS ABS: 2.2 10*3/uL (ref 0.7–4.0)
Lymphocytes Relative: 19 %
MCH: 30.9 pg (ref 26.0–34.0)
MCHC: 34.9 g/dL (ref 30.0–36.0)
MCV: 88.6 fL (ref 78.0–100.0)
MONO ABS: 1.4 10*3/uL — AB (ref 0.1–1.0)
MONOS PCT: 12 %
Neutro Abs: 7.9 10*3/uL — ABNORMAL HIGH (ref 1.7–7.7)
Neutrophils Relative %: 67 %
PLATELETS: 348 10*3/uL (ref 150–400)
RBC: 3.07 MIL/uL — ABNORMAL LOW (ref 4.22–5.81)
RDW: 14.1 % (ref 11.5–15.5)
WBC: 11.8 10*3/uL — ABNORMAL HIGH (ref 4.0–10.5)

## 2015-09-06 LAB — BASIC METABOLIC PANEL
Anion gap: 8 (ref 5–15)
BUN: 53 mg/dL — AB (ref 6–20)
CHLORIDE: 106 mmol/L (ref 101–111)
CO2: 16 mmol/L — ABNORMAL LOW (ref 22–32)
CREATININE: 2.37 mg/dL — AB (ref 0.61–1.24)
Calcium: 9.3 mg/dL (ref 8.9–10.3)
GFR calc Af Amer: 35 mL/min — ABNORMAL LOW (ref >60)
GFR calc non Af Amer: 30 mL/min — ABNORMAL LOW (ref >60)
GLUCOSE: 90 mg/dL (ref 65–99)
Potassium: 5.5 mmol/L — ABNORMAL HIGH (ref 3.5–5.1)
Sodium: 130 mmol/L — ABNORMAL LOW (ref 135–145)

## 2015-09-06 NOTE — Progress Notes (Signed)
Orthopedics Progress Note  Subjective: I feel better today  Objective:  Filed Vitals:   09/06/15 0500  BP: 157/74  Pulse: 78  Temp: 98.4 F (36.9 C)  Resp: 17    General: Awake and alert  Musculoskeletal: Left UE moving better without pain, min swelling, NVI Neurovascularly intact  Lab Results  Component Value Date   WBC 11.8* 09/06/2015   HGB 9.5* 09/06/2015   HCT 27.2* 09/06/2015   MCV 88.6 09/06/2015   PLT 348 09/06/2015       Component Value Date/Time   NA 130* 09/06/2015 0427   K 5.5* 09/06/2015 0427   CL 106 09/06/2015 0427   CO2 16* 09/06/2015 0427   GLUCOSE 90 09/06/2015 0427   BUN 53* 09/06/2015 0427   CREATININE 2.37* 09/06/2015 0427   CREATININE 0.70 08/30/2013 1415   CREATININE 0.85 07/29/2013 1610   CALCIUM 9.3 09/06/2015 0427   GFRNONAA 30* 09/06/2015 0427   GFRAA 35* 09/06/2015 0427    Lab Results  Component Value Date   INR 1.00 04/28/2015   INR 0.95 04/27/2015   INR 0.92 05/30/2013    Assessment/Plan: POD #2 s/p Procedure(s): OPEN REDUCTION INTERNAL FIXATION (ORIF) LEFT PROXIMAL HUMERUS FRACTURE Hyponatremia and hyperkalemia noted this morning as well as elevated creatinine all unchanged or improved over his admission labs.  Will check with internal medicine prior to discharge. Plan to discharge to [facility] PT later today Follow up with Dr Darrelyn Hillock and Dr Ranell Patrick next week  Almedia Balls. Ranell Patrick, MD 09/06/2015 7:40 AM

## 2015-09-06 NOTE — Progress Notes (Signed)
   Subjective: 2 Days Post-Op Procedure(s) (LRB): OPEN REDUCTION INTERNAL FIXATION (ORIF) LEFT PROXIMAL HUMERUS FRACTURE (Left)  After extensive review of medical chart, prior admissions and previous labs patient has a hx of chronic renal disease as shown by renal ultrasound in May due to diabetes and essential hypertension. Recommended for close follow up with nephrology which was not completed.  Currently pt feeling better and labs have improved since admission Plan for d/c today and f/u in the office Patient reports pain as mild.  Objective:   VITALS:   Filed Vitals:   09/06/15 0500  BP: 157/74  Pulse: 78  Temp: 98.4 F (36.9 C)  Resp: 17    Left shoulder incision healing well nv intact distally No rashes  Minimal edema distally  LABS  Recent Labs  09/04/15 1007 09/05/15 0533 09/06/15 0427  HGB 8.1* 7.1* 9.5*  HCT 24.1* 20.8* 27.2*  WBC 10.1  --  11.8*  PLT 454*  --  348     Recent Labs  09/04/15 1007 09/05/15 0533 09/06/15 0427  NA 134* 130* 130*  K 5.5* 5.5* 5.5*  BUN 90* 74* 53*  CREATININE 4.12* 3.05* 2.37*  GLUCOSE 109* 94 90     Assessment/Plan: 2 Days Post-Op Procedure(s) (LRB): OPEN REDUCTION INTERNAL FIXATION (ORIF) LEFT PROXIMAL HUMERUS FRACTURE (Left) Plan to d/c home today F/u in 2 weeks in the office    Alphonsa Overall, MPAS, PA-C  09/06/2015, 8:33 AM

## 2015-09-06 NOTE — Progress Notes (Signed)
Occupational Therapy Treatment Patient Details Name: Wesley Burns MRN: [id number] DOB: [birth year] Today's Date: 09/06/2015    History of present illness The patient is a 49 year old male, who presents with a history of displaced left proximal humerus fracture, sustained at the same time he had a broken right wrist. S/p OPEN REDUCTION INTERNAL FIXATION (ORIF) LEFT PROXIMAL HUMERUS FRACTURE (Left)    OT comments  Pt making good progress toward OT goals. Pt much more alert and focused today, able to recall about 50% of items pt was educated on yesterday. Reviewed precautions, sling management and wear schedule, positioning of UE, UB ADL technique; pt verbalizes understanding. Pt demonstrated ability to complete exercises. Pt reports that his partner is not going out of town this weekend and will be able to provide 24 hour supervision upon d/c home. Continue to follow pt acutely.    Follow Up Recommendations  Home health OT;Supervision/Assistance - 24 hour    Equipment Recommendations  None recommended by OT    Recommendations for Other Services PT consult    Precautions / Restrictions Precautions Precautions: Shoulder;Fall Type of Shoulder Precautions: Conservative protocol: gentle lap slides, assisted rotation, elbow/wrist/hand AROM OK  Shoulder Interventions: Shoulder sling/immobilizer;At all times;Off for dressing/bathing/exercises Precaution Comments: Reviewed precaution handout Required Braces or Orthoses: Sling Restrictions Weight Bearing Restrictions: Yes LUE Weight Bearing: Non weight bearing       Mobility Bed Mobility Overal bed mobility: Modified Independent                Transfers Overall transfer level: Needs assistance   Transfers: Sit to/from Stand Sit to Stand: Min assist         General transfer comment: Min hand held assit to boost up from EOB    Balance Overall balance assessment: Needs assistance         Standing balance support:  Single extremity supported Standing balance-Leahy Scale: Poor Standing balance comment: Pt relies on mod hand held assist for balance                   ADL Overall ADL's : Needs assistance/impaired                                     Functional mobility during ADLs: Minimal assistance General ADL Comments: Reviewed shoulder precautions, sling management and wear schedule, UB ADL technique, positioning of UE. Pt verbalized understanding, was able to recall about 50% of what we had talked about yesterday.       Vision                     Perception     Praxis      Cognition   Behavior During Therapy: [facility] for tasks assessed/performed Overall Cognitive Status: Within Functional Limits for tasks assessed                       Extremity/Trunk Assessment               Exercises Shoulder Exercises Shoulder Flexion: AROM;Right;10 reps;Supine Elbow Flexion: AROM;Both;10 reps;Supine Wrist Flexion: AROM;Left;10 reps;Supine Digit Composite Flexion: AROM;Both;10 reps;Supine Donning/doffing sling/immobilizer: Minimal assistance;Patient able to independently direct caregiver Correct positioning of sling/immobilizer: Minimal assistance;Patient able to independently direct caregiver ROM for elbow, wrist and digits of operated UE: Minimal assistance Sling wearing schedule (on at all times/off for ADL's): Minimal assistance;Patient able to independently direct caregiver Proper positioning  of operated UE when showering: Minimal assistance;Patient able to independently direct caregiver Positioning of UE while sleeping: Minimal assistance;Patient able to independently direct caregiver   Shoulder Instructions Shoulder Instructions Donning/doffing sling/immobilizer: Minimal assistance;Patient able to independently direct caregiver Correct positioning of sling/immobilizer: Minimal assistance;Patient able to independently direct caregiver ROM for elbow,  wrist and digits of operated UE: Minimal assistance Sling wearing schedule (on at all times/off for ADL's): Minimal assistance;Patient able to independently direct caregiver Proper positioning of operated UE when showering: Minimal assistance;Patient able to independently direct caregiver Positioning of UE while sleeping: Minimal assistance;Patient able to independently direct caregiver     General Comments      Pertinent Vitals/ Pain       Pain Assessment: 0-10 Pain Score: 7  Pain Location: L shoulder  Pain Descriptors / Indicators: Aching;Sore;Grimacing;Guarding Pain Intervention(s): Limited activity within patient's tolerance;Monitored during session;Repositioned;Ice applied  Home Living                                          Prior Functioning/Environment              Frequency Min 3X/week     Progress Toward Goals  OT Goals(current goals can now be found in the care plan section)  Progress towards OT goals: Progressing toward goals  Acute Rehab OT Goals Patient Stated Goal: none stated   Plan Discharge plan remains appropriate    Co-evaluation                 End of Session Equipment Utilized During Treatment: Gait belt;Other (comment) (sling)   Activity Tolerance Patient tolerated treatment well   Patient Left in chair;with call bell/phone within reach   Nurse Communication          Time: 0630-1601 OT Time Calculation (min): 26 min  Charges: OT General Charges $OT Visit: 1 Procedure OT Treatments $Self Care/Home Management : 8-22 mins $Therapeutic Exercise: 8-22 mins  Gaye Alken M.S., OTR/L Pager: [phone]  09/06/2015, 1:02 PM

## 2015-09-06 NOTE — Discharge Summary (Signed)
Physician Discharge Summary   Patient ID: Wesley Burns MRN: [id number] DOB/AGE: October 09, 1966 49 y.o.  Admit date: 09/04/2015 Discharge date: 09/06/2015  Admission Diagnoses:  Active Problems:   Fracture, shoulder   Discharge Diagnoses:  Same   Surgeries: Procedure(s): OPEN REDUCTION INTERNAL FIXATION (ORIF) LEFT PROXIMAL HUMERUS FRACTURE on 09/04/2015   Consultants: OT,, care management  Discharged Condition: Stable  Hospital Course: JAQUALYN Burns is an 49 y.o. male who was admitted 09/04/2015 with a chief complaint of left shoulder pain, and found to have a diagnosis of left comminuted and displaced proximal humerus fracture.  They were brought to the operating room on 09/04/2015 and underwent the above named procedures.    The patient had an uncomplicated hospital course and was stable for discharge.  Recent vital signs:  Filed Vitals:   09/06/15 0500  BP: 157/74  Pulse: 78  Temp: 98.4 F (36.9 C)  Resp: 17    Recent laboratory studies:  Results for orders placed or performed during the hospital encounter of 09/04/15  Glucose, capillary  Result Value Ref Range   Glucose-Capillary 103 (H) 65 - 99 mg/dL  CBC  Result Value Ref Range   WBC 10.1 4.0 - 10.5 K/uL   RBC 2.72 (L) 4.22 - 5.81 MIL/uL   Hemoglobin 8.1 (L) 13.0 - 17.0 g/dL   HCT 36.1 (L) 44.3 - 15.4 %   MCV 88.6 78.0 - 100.0 fL   MCH 29.8 26.0 - 34.0 pg   MCHC 33.6 30.0 - 36.0 g/dL   RDW 00.8 67.6 - 19.5 %   Platelets 454 (H) 150 - 400 K/uL  Comprehensive metabolic panel  Result Value Ref Range   Sodium 134 (L) 135 - 145 mmol/L   Potassium 5.5 (H) 3.5 - 5.1 mmol/L   Chloride 113 (H) 101 - 111 mmol/L   CO2 13 (L) 22 - 32 mmol/L   Glucose, Bld 109 (H) 65 - 99 mg/dL   BUN 90 (H) 6 - 20 mg/dL   Creatinine, Ser 0.93 (H) 0.61 - 1.24 mg/dL   Calcium 9.8 8.9 - 26.7 mg/dL   Total Protein 6.0 (L) 6.5 - 8.1 g/dL   Albumin 2.3 (L) 3.5 - 5.0 g/dL   AST 14 (L) 15 - 41 U/L   ALT 12 (L) 17 - 63 U/L   Alkaline Phosphatase 117 38 - 126 U/L   Total Bilirubin 1.1 0.3 - 1.2 mg/dL   GFR calc non Af Amer 15 (L) >60 mL/min   GFR calc Af Amer 18 (L) >60 mL/min   Anion gap 8 5 - 15  Hemoglobin and hematocrit, blood  Result Value Ref Range   Hemoglobin 7.1 (L) 13.0 - 17.0 g/dL   HCT 12.4 (L) 58.0 - 99.8 %  Basic metabolic panel  Result Value Ref Range   Sodium 130 (L) 135 - 145 mmol/L   Potassium 5.5 (H) 3.5 - 5.1 mmol/L   Chloride 106 101 - 111 mmol/L   CO2 12 (L) 22 - 32 mmol/L   Glucose, Bld 94 65 - 99 mg/dL   BUN 74 (H) 6 - 20 mg/dL   Creatinine, Ser 3.38 (H) 0.61 - 1.24 mg/dL   Calcium 9.2 8.9 - 25.0 mg/dL   GFR calc non Af Amer 22 (L) >60 mL/min   GFR calc Af Amer 26 (L) >60 mL/min   Anion gap 12 5 - 15  Basic metabolic panel  Result Value Ref Range   Sodium 130 (L) 135 - 145 mmol/L  Potassium 5.5 (H) 3.5 - 5.1 mmol/L   Chloride 106 101 - 111 mmol/L   CO2 16 (L) 22 - 32 mmol/L   Glucose, Bld 90 65 - 99 mg/dL   BUN 53 (H) 6 - 20 mg/dL   Creatinine, Ser 6.29 (H) 0.61 - 1.24 mg/dL   Calcium 9.3 8.9 - 47.6 mg/dL   GFR calc non Af Amer 30 (L) >60 mL/min   GFR calc Af Amer 35 (L) >60 mL/min   Anion gap 8 5 - 15  CBC with Differential/Platelet  Result Value Ref Range   WBC 11.8 (H) 4.0 - 10.5 K/uL   RBC 3.07 (L) 4.22 - 5.81 MIL/uL   Hemoglobin 9.5 (L) 13.0 - 17.0 g/dL   HCT 54.6 (L) 50.3 - 54.6 %   MCV 88.6 78.0 - 100.0 fL   MCH 30.9 26.0 - 34.0 pg   MCHC 34.9 30.0 - 36.0 g/dL   RDW 56.8 12.7 - 51.7 %   Platelets 348 150 - 400 K/uL   Neutrophils Relative % 67 %   Neutro Abs 7.9 (H) 1.7 - 7.7 K/uL   Lymphocytes Relative 19 %   Lymphs Abs 2.2 0.7 - 4.0 K/uL   Monocytes Relative 12 %   Monocytes Absolute 1.4 (H) 0.1 - 1.0 K/uL   Eosinophils Relative 3 %   Eosinophils Absolute 0.3 0.0 - 0.7 K/uL   Basophils Relative 0 %   Basophils Absolute 0.0 0.0 - 0.1 K/uL  Type and screen  Result Value Ref Range   ABO/RH(D) A NEG    Antibody Screen NEG    Sample Expiration  09/08/2015    Unit Number [id number]    Blood Component Type RED CELLS,LR    Unit division 00    Status of Unit ISSUED    Transfusion Status OK TO TRANSFUSE    Crossmatch Result Compatible    Unit Number [id number]    Blood Component Type RED CELLS,LR    Unit division 00    Status of Unit ISSUED    Transfusion Status OK TO TRANSFUSE    Crossmatch Result Compatible   Prepare RBC  Result Value Ref Range   Order Confirmation ORDER PROCESSED BY BLOOD BANK   ABO/Rh  Result Value Ref Range   ABO/RH(D) A NEG     Discharge Medications:     Medication List    TAKE these medications        busPIRone 10 MG tablet  Commonly known as:  BUSPAR  Take 1 tablet (10 mg total) by mouth 2 (two) times daily.     diphenhydrAMINE 25 mg capsule  Commonly known as:  BENADRYL  Take 1 capsule (25 mg total) by mouth every 6 (six) hours as needed for itching.     furosemide 40 MG tablet  Commonly known as:  LASIX  Take 1 tablet (40 mg total) by mouth daily.     HYDROcodone-acetaminophen 5-325 MG tablet  Commonly known as:  NORCO/VICODIN  Take 1 tablet by mouth every 12 (twelve) hours as needed for moderate pain.     methocarbamol 500 MG tablet  Commonly known as:  ROBAXIN  Take 1 tablet (500 mg total) by mouth 3 (three) times daily as needed.     oxyCODONE-acetaminophen 5-325 MG tablet  Commonly known as:  ROXICET  Take 1-2 tablets by mouth every 4 (four) hours as needed for severe pain.     propranolol ER 80 MG 24 hr capsule  Commonly known as:  INDERAL LA  Take 1 capsule (80 mg total) by mouth daily.     spironolactone 100 MG tablet  Commonly known as:  ALDACTONE  Take 1 tablet (100 mg total) by mouth daily.     thiamine 100 MG tablet  Take 1 tablet (100 mg total) by mouth daily.        Diagnostic Studies: Ct Shoulder Left Wo Contrast  08/29/2015   CLINICAL DATA:  Left shoulder fracture.  EXAM: CT OF THE LEFT SHOULDER WITHOUT CONTRAST  TECHNIQUE: Multidetector CT imaging  was performed according to the standard protocol. Multiplanar CT image reconstructions were also generated.  COMPARISON:  None.  FINDINGS: There is a severely comminuted fracture of the surgical neck of the left humerus with anterior displacement of the shaft measuring 4 cm. There is apex anterolateral angulation. The fracture involves the greater and lesser tuberosities. There is significant stranding in the surrounding soft tissues likely representing a small amount of hemorrhage.  There is no other fracture or dislocation. There is no other fluid collection or hematoma there is no muscle atrophy.  The visualized left lung is clear. There is coronary artery atherosclerosis in the left main and LAD.  IMPRESSION: 1. Severely comminuted fracture of the surgical neck of the left proximal humerus with anterior displacement of the proximal shaft by approximately 4 cm and apex anterolateral angulation.   Electronically Signed   By: Elige Ko   On: 08/29/2015 18:33   Dg Humerus Left  09/04/2015   CLINICAL DATA:  Left humeral neck fracture.  EXAM: DG C-ARM 61-120 MIN; LEFT HUMERUS - 2+ VIEW  COMPARISON:  None.  FINDINGS: Multiple intraoperative images show placement of fixation plate and screws transfixing a left femoral neck fracture in near anatomic alignment.  IMPRESSION: Internal fixation of left femoral neck fracture in near anatomic alignment.   Electronically Signed   By: Myles Rosenthal M.D.   On: 09/04/2015 12:42   Dg C-arm 1-60 Min  09/04/2015   CLINICAL DATA:  Left humeral neck fracture.  EXAM: DG C-ARM 61-120 MIN; LEFT HUMERUS - 2+ VIEW  COMPARISON:  None.  FINDINGS: Multiple intraoperative images show placement of fixation plate and screws transfixing a left femoral neck fracture in near anatomic alignment.  IMPRESSION: Internal fixation of left femoral neck fracture in near anatomic alignment.   Electronically Signed   By: Myles Rosenthal M.D.   On: 09/04/2015 12:42    Disposition: home with home health  care        Follow-up Information    Follow up with Murial Beam,STEVEN R, MD. Call in 1 week.   Specialty:  Orthopedic Surgery   Why:  [phone]   Contact information:   [address] [place] [address] [phone]       Follow up with [facility].   Specialty:  Home Health Services   Why:  They will contact you to schedule home therapy visits.    Contact information:   [address] [place] [address] [phone]       Follow up with GIOFFRE,RONALD A, MD In 1 week.   Specialty:  Orthopedic Surgery   Why:  [phone]   Contact information:   [address] [place] [address] [phone]        Signed: Verlee Rossetti 09/06/2015, 7:47 AM

## 2015-09-30 ENCOUNTER — Emergency Department (HOSPITAL_COMMUNITY)
Admission: EM | Admit: 2015-09-30 | Discharge: 2015-09-30 | Disposition: A | Discharge status: Home / Self Care | Payer: 59 | Attending: Emergency Medicine | Admitting: Emergency Medicine

## 2015-09-30 ENCOUNTER — Encounter (HOSPITAL_COMMUNITY): Payer: Self-pay | Admitting: Emergency Medicine

## 2015-09-30 DIAGNOSIS — E875 Hyperkalemia: Secondary | ICD-10-CM | POA: Insufficient documentation

## 2015-09-30 DIAGNOSIS — F419 Anxiety disorder, unspecified: Secondary | ICD-10-CM | POA: Insufficient documentation

## 2015-09-30 DIAGNOSIS — Z87828 Personal history of other (healed) physical injury and trauma: Secondary | ICD-10-CM | POA: Insufficient documentation

## 2015-09-30 DIAGNOSIS — I10 Essential (primary) hypertension: Secondary | ICD-10-CM | POA: Diagnosis not present

## 2015-09-30 DIAGNOSIS — Z720 Tobacco use: Secondary | ICD-10-CM | POA: Diagnosis not present

## 2015-09-30 DIAGNOSIS — Z8669 Personal history of other diseases of the nervous system and sense organs: Secondary | ICD-10-CM | POA: Insufficient documentation

## 2015-09-30 DIAGNOSIS — Z79899 Other long term (current) drug therapy: Secondary | ICD-10-CM | POA: Insufficient documentation

## 2015-09-30 DIAGNOSIS — Z8739 Personal history of other diseases of the musculoskeletal system and connective tissue: Secondary | ICD-10-CM | POA: Insufficient documentation

## 2015-09-30 DIAGNOSIS — L97509 Non-pressure chronic ulcer of other part of unspecified foot with unspecified severity: Secondary | ICD-10-CM | POA: Diagnosis not present

## 2015-09-30 DIAGNOSIS — Z8719 Personal history of other diseases of the digestive system: Secondary | ICD-10-CM | POA: Diagnosis not present

## 2015-09-30 DIAGNOSIS — F329 Major depressive disorder, single episode, unspecified: Secondary | ICD-10-CM | POA: Diagnosis not present

## 2015-09-30 DIAGNOSIS — N29 Other disorders of kidney and ureter in diseases classified elsewhere: Secondary | ICD-10-CM | POA: Diagnosis not present

## 2015-09-30 DIAGNOSIS — N289 Disorder of kidney and ureter, unspecified: Secondary | ICD-10-CM

## 2015-09-30 DIAGNOSIS — Z872 Personal history of diseases of the skin and subcutaneous tissue: Secondary | ICD-10-CM | POA: Insufficient documentation

## 2015-09-30 DIAGNOSIS — Z87442 Personal history of urinary calculi: Secondary | ICD-10-CM | POA: Diagnosis not present

## 2015-09-30 DIAGNOSIS — R799 Abnormal finding of blood chemistry, unspecified: Secondary | ICD-10-CM | POA: Diagnosis present

## 2015-09-30 DIAGNOSIS — E11621 Type 2 diabetes mellitus with foot ulcer: Secondary | ICD-10-CM | POA: Diagnosis not present

## 2015-09-30 LAB — URINALYSIS, ROUTINE W REFLEX MICROSCOPIC
Bilirubin Urine: NEGATIVE
Glucose, UA: NEGATIVE mg/dL
Ketones, ur: NEGATIVE mg/dL
Leukocytes, UA: NEGATIVE
Nitrite: NEGATIVE
Protein, ur: >300 mg/dL — AB
Specific Gravity, Urine: 1.007 (ref 1.005–1.030)
Urobilinogen, UA: 0.2 mg/dL (ref 0.0–1.0)
pH: 6 (ref 5.0–8.0)

## 2015-09-30 LAB — CBC WITH DIFFERENTIAL/PLATELET
Basophils Absolute: 0 10*3/uL (ref 0.0–0.1)
Basophils Relative: 1 %
Eosinophils Absolute: 0.6 10*3/uL (ref 0.0–0.7)
Eosinophils Relative: 6 %
HCT: 32 % — ABNORMAL LOW (ref 39.0–52.0)
Hemoglobin: 10.5 g/dL — ABNORMAL LOW (ref 13.0–17.0)
Lymphocytes Relative: 36 %
Lymphs Abs: 3 10*3/uL (ref 0.7–4.0)
MCH: 30.1 pg (ref 26.0–34.0)
MCHC: 32.8 g/dL (ref 30.0–36.0)
MCV: 91.7 fL (ref 78.0–100.0)
Monocytes Absolute: 0.7 10*3/uL (ref 0.1–1.0)
Monocytes Relative: 8 %
Neutro Abs: 4.2 10*3/uL (ref 1.7–7.7)
Neutrophils Relative %: 49 %
Platelets: 378 10*3/uL (ref 150–400)
RBC: 3.49 MIL/uL — ABNORMAL LOW (ref 4.22–5.81)
RDW: 15.3 % (ref 11.5–15.5)
WBC: 8.5 10*3/uL (ref 4.0–10.5)

## 2015-09-30 LAB — COMPREHENSIVE METABOLIC PANEL
ALT: 10 U/L — ABNORMAL LOW (ref 17–63)
AST: 20 U/L (ref 15–41)
Albumin: 2.8 g/dL — ABNORMAL LOW (ref 3.5–5.0)
Alkaline Phosphatase: 104 U/L (ref 38–126)
Anion gap: 11 (ref 5–15)
BUN: 41 mg/dL — ABNORMAL HIGH (ref 6–20)
CO2: 17 mmol/L — ABNORMAL LOW (ref 22–32)
Calcium: 11.2 mg/dL — ABNORMAL HIGH (ref 8.9–10.3)
Chloride: 105 mmol/L (ref 101–111)
Creatinine, Ser: 2.37 mg/dL — ABNORMAL HIGH (ref 0.61–1.24)
GFR calc Af Amer: 35 mL/min — ABNORMAL LOW (ref >60)
GFR calc non Af Amer: 30 mL/min — ABNORMAL LOW (ref >60)
Glucose, Bld: 75 mg/dL (ref 65–99)
Potassium: 5.2 mmol/L — ABNORMAL HIGH (ref 3.5–5.1)
Sodium: 133 mmol/L — ABNORMAL LOW (ref 135–145)
Total Bilirubin: 0.9 mg/dL (ref 0.3–1.2)
Total Protein: 6.4 g/dL — ABNORMAL LOW (ref 6.5–8.1)

## 2015-09-30 LAB — URINE MICROSCOPIC-ADD ON

## 2015-09-30 LAB — CK: Total CK: 53 U/L (ref 49–397)

## 2015-09-30 NOTE — ED Notes (Signed)
States someone called this morning and told him his K was high and to seek care. He does not remember the number. Denies symptoms.
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
## 2015-09-30 NOTE — Discharge Instructions (Signed)
Chronic Kidney Disease Chronic kidney disease happens when the kidneys are damaged over a long period. The kidneys are two organs that do many important jobs in the body. These jobs include:  Removing wastes and extra fluids from the blood.  Making hormones that help to keep the body healthy.  Making sure that the body has the right amount of fluids and chemicals. Chronic kidney disease may be caused by many things. The kidney damage occurs slowly. If too much damage occurs, the kidneys may stop working the way that they should. This is dangerous. Treatment can help to slow down the damage and keep it from getting worse. HOME CARE  Follow your diet as told by your doctor. You may need to limit the amount of salt (sodium) and protein that you eat each day.  Take medicines only as told by your doctor. Do not take any new medicines unless your doctor approves it.  Quit smoking if you smoke. Talk to your doctor about programs that may help you quit smoking.  Have your blood pressure checked regularly and keep track of the results.  Start or keep doing an exercise plan.  Get shots (immunizations) as told by your doctor.  Take vitamins and minerals as told by your doctor.  Keep all follow-up visits as told by your doctor. This is important. GET HELP RIGHT AWAY IF:   Your symptoms get worse.  You have new symptoms.  You have symptoms of end-stage kidney disease. These include:  Headaches.  Skin that is darker or lighter than normal.  Numbness in the hands or feet.  Easy bruising.  Frequent hiccups.  Stopping of menstrual periods in women.  You have a fever.  You are making very little pee (urine).  You have pain or bleeding when you pee.   This information is not intended to replace advice given to you by your health care provider. Make sure you discuss any questions you have with your health care provider.   Document Released: 02/12/2010 Document Revised: 08/09/2015  Document Reviewed: 07/17/2012 Elsevier Interactive Patient Education [employer]  Please discontinue using spironolactone until making contact with her primary care doctor. Please contact them immediately for further evaluation and management. If you develop any new or worsening signs or symptoms please return to the emergency room for further evaluation and management.

## 2015-10-02 ENCOUNTER — Other Ambulatory Visit: Payer: Self-pay | Admitting: Family Medicine

## 2015-10-02 ENCOUNTER — Ambulatory Visit
Admission: RE | Admit: 2015-10-02 | Discharge: 2015-10-02 | Disposition: A | Discharge status: Home / Self Care | Payer: 59 | Source: Ambulatory Visit | Attending: Family Medicine | Admitting: Family Medicine

## 2015-10-02 DIAGNOSIS — L97529 Non-pressure chronic ulcer of other part of left foot with unspecified severity: Secondary | ICD-10-CM

## 2015-11-13 ENCOUNTER — Other Ambulatory Visit: Payer: Self-pay | Admitting: Gastroenterology

## 2015-11-13 NOTE — Addendum Note (Signed)
Addended by: Charlott Rakes on: 11/13/2015 04:47 PM   Modules accepted: Orders

## 2015-11-14 ENCOUNTER — Encounter (HOSPITAL_COMMUNITY): Payer: Self-pay | Admitting: *Deleted

## 2015-11-15 ENCOUNTER — Encounter (HOSPITAL_COMMUNITY): Payer: Self-pay | Admitting: Critical Care Medicine

## 2015-11-15 ENCOUNTER — Emergency Department (HOSPITAL_COMMUNITY)
Admission: EM | Admit: 2015-11-15 | Discharge: 2015-11-15 | Disposition: A | Discharge status: Home / Self Care | Payer: 59 | Attending: Emergency Medicine | Admitting: Emergency Medicine

## 2015-11-15 ENCOUNTER — Encounter (HOSPITAL_COMMUNITY)
Admission: RE | Disposition: A | Discharge status: Home / Self Care | Payer: Self-pay | Source: Ambulatory Visit | Attending: Gastroenterology

## 2015-11-15 ENCOUNTER — Encounter (HOSPITAL_COMMUNITY): Payer: Self-pay | Admitting: Emergency Medicine

## 2015-11-15 ENCOUNTER — Ambulatory Visit (HOSPITAL_COMMUNITY)
Admission: RE | Admit: 2015-11-15 | Discharge: 2015-11-15 | Disposition: A | Discharge status: Home / Self Care | Payer: 59 | Source: Ambulatory Visit | Attending: Gastroenterology | Admitting: Gastroenterology

## 2015-11-15 DIAGNOSIS — F419 Anxiety disorder, unspecified: Secondary | ICD-10-CM | POA: Insufficient documentation

## 2015-11-15 DIAGNOSIS — Z79899 Other long term (current) drug therapy: Secondary | ICD-10-CM | POA: Insufficient documentation

## 2015-11-15 DIAGNOSIS — F17210 Nicotine dependence, cigarettes, uncomplicated: Secondary | ICD-10-CM | POA: Diagnosis not present

## 2015-11-15 DIAGNOSIS — I129 Hypertensive chronic kidney disease with stage 1 through stage 4 chronic kidney disease, or unspecified chronic kidney disease: Secondary | ICD-10-CM | POA: Diagnosis present

## 2015-11-15 DIAGNOSIS — E119 Type 2 diabetes mellitus without complications: Secondary | ICD-10-CM | POA: Insufficient documentation

## 2015-11-15 DIAGNOSIS — Z862 Personal history of diseases of the blood and blood-forming organs and certain disorders involving the immune mechanism: Secondary | ICD-10-CM | POA: Insufficient documentation

## 2015-11-15 DIAGNOSIS — F329 Major depressive disorder, single episode, unspecified: Secondary | ICD-10-CM | POA: Insufficient documentation

## 2015-11-15 DIAGNOSIS — Z87442 Personal history of urinary calculi: Secondary | ICD-10-CM | POA: Diagnosis not present

## 2015-11-15 DIAGNOSIS — K219 Gastro-esophageal reflux disease without esophagitis: Secondary | ICD-10-CM | POA: Insufficient documentation

## 2015-11-15 DIAGNOSIS — E876 Hypokalemia: Secondary | ICD-10-CM | POA: Diagnosis not present

## 2015-11-15 DIAGNOSIS — N189 Chronic kidney disease, unspecified: Secondary | ICD-10-CM | POA: Insufficient documentation

## 2015-11-15 DIAGNOSIS — I10 Essential (primary) hypertension: Secondary | ICD-10-CM

## 2015-11-15 HISTORY — DX: Other specified abnormal findings of blood chemistry: R79.89

## 2015-11-15 HISTORY — DX: Abnormal results of liver function studies: R94.5

## 2015-11-15 LAB — COMPREHENSIVE METABOLIC PANEL
ALBUMIN: 2.2 g/dL — AB (ref 3.5–5.0)
ALT: 22 U/L (ref 17–63)
ANION GAP: 9 (ref 5–15)
AST: 34 U/L (ref 15–41)
Alkaline Phosphatase: 179 U/L — ABNORMAL HIGH (ref 38–126)
BILIRUBIN TOTAL: 1.1 mg/dL (ref 0.3–1.2)
BUN: 34 mg/dL — ABNORMAL HIGH (ref 6–20)
CHLORIDE: 103 mmol/L (ref 101–111)
CO2: 22 mmol/L (ref 22–32)
Calcium: 7.8 mg/dL — ABNORMAL LOW (ref 8.9–10.3)
Creatinine, Ser: 2.36 mg/dL — ABNORMAL HIGH (ref 0.61–1.24)
GFR calc Af Amer: 35 mL/min — ABNORMAL LOW (ref >60)
GFR calc non Af Amer: 30 mL/min — ABNORMAL LOW (ref >60)
GLUCOSE: 123 mg/dL — AB (ref 65–99)
POTASSIUM: 2.5 mmol/L — AB (ref 3.5–5.1)
SODIUM: 134 mmol/L — AB (ref 135–145)
TOTAL PROTEIN: 5.3 g/dL — AB (ref 6.5–8.1)

## 2015-11-15 LAB — CBC WITH DIFFERENTIAL/PLATELET
BASOS ABS: 0 10*3/uL (ref 0.0–0.1)
Basophils Relative: 1 %
Eosinophils Absolute: 0.1 10*3/uL (ref 0.0–0.7)
Eosinophils Relative: 1 %
HEMATOCRIT: 33.1 % — AB (ref 39.0–52.0)
Hemoglobin: 11.5 g/dL — ABNORMAL LOW (ref 13.0–17.0)
LYMPHS PCT: 27 %
Lymphs Abs: 2.2 10*3/uL (ref 0.7–4.0)
MCH: 31.4 pg (ref 26.0–34.0)
MCHC: 34.7 g/dL (ref 30.0–36.0)
MCV: 90.4 fL (ref 78.0–100.0)
MONO ABS: 0.6 10*3/uL (ref 0.1–1.0)
Monocytes Relative: 8 %
NEUTROS ABS: 5.1 10*3/uL (ref 1.7–7.7)
Neutrophils Relative %: 63 %
Platelets: 306 10*3/uL (ref 150–400)
RBC: 3.66 MIL/uL — AB (ref 4.22–5.81)
RDW: 14.6 % (ref 11.5–15.5)
WBC: 8 10*3/uL (ref 4.0–10.5)

## 2015-11-15 SURGERY — CANCELLED PROCEDURE

## 2015-11-15 MED ORDER — PROPRANOLOL HCL 80 MG PO TABS
80.0000 mg | ORAL_TABLET | Freq: Every day | ORAL | Status: DC
  Administered 2015-11-15: 80 mg via ORAL
  Filled 2015-11-15: qty 1

## 2015-11-15 MED ORDER — SPIRONOLACTONE 100 MG PO TABS
100.0000 mg | ORAL_TABLET | Freq: Every day | ORAL | Status: DC
  Administered 2015-11-15: 100 mg via ORAL
  Filled 2015-11-15: qty 1

## 2015-11-15 MED ORDER — SODIUM CHLORIDE 0.9 % IV SOLN: INTRAVENOUS | Status: DC

## 2015-11-15 MED ORDER — POTASSIUM CHLORIDE CRYS ER 20 MEQ PO TBCR: 20.0000 meq | EXTENDED_RELEASE_TABLET | Freq: Every day | ORAL | Status: DC

## 2015-11-15 MED ORDER — POTASSIUM CHLORIDE CRYS ER 20 MEQ PO TBCR
20.0000 meq | EXTENDED_RELEASE_TABLET | Freq: Once | ORAL | Status: AC
  Administered 2015-11-15: 20 meq via ORAL
  Filled 2015-11-15: qty 1

## 2015-11-15 MED ORDER — POTASSIUM CHLORIDE CRYS ER 10 MEQ PO TBCR: 10.0000 meq | EXTENDED_RELEASE_TABLET | Freq: Every day | ORAL | Status: DC

## 2015-11-15 NOTE — ED Notes (Signed)
Patient was schedule for endoscopy and colonoscopy today.  On arrival to his appointment, blood pressure was 268/114.  Patient was then transported to ED.  Patient states he did not take his BP medicine this morning.  Patient in no apparent distress at this time.
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
## 2015-11-15 NOTE — Progress Notes (Signed)
[facility] Gastroenterology Progress Note  Wesley Burns 49 y.o. 1966/09/09   Subjective:  Procedure cancelled due to markedly elevated BP of 255/112. Denies headaches, chest pain, dizziness, or shortness of breath. Reports compliant with BP meds except for today. Continues to drink alcohol.     Objective: Vital signs in last 24 hours: Filed Vitals:   11/15/15 0940  BP: 268/103  Pulse: 69  Resp: 19    Physical Exam: Gen: alert, no acute distress HEENT: anicteric Neck: supple, nontender CV: RRR Chest: CTA B Abd: soft, nontender, nondistended, +BS Ext: no RLE, cast on LLE Skin: no jaundice  Lab Results: No results for input(s): NA, K, CL, CO2, GLUCOSE, BUN, CREATININE, CALCIUM, MG, PHOS in the last 72 hours. No results for input(s): AST, ALT, ALKPHOS, BILITOT, PROT, ALBUMIN in the last 72 hours. No results for input(s): WBC, NEUTROABS, HGB, HCT, MCV, PLT in the last 72 hours. No results for input(s): LABPROT, INR in the last 72 hours.    Assessment/Plan: Alcoholic cirrhosis with ongoing alcohol use. Severe HTN and will be sent to the ER for further management of his BP. Screening colonoscopy and EGD to check for varices cancelled by anesthesia and will be postponed until a later date in 2017.   Wesley Shatswell C. 11/15/2015, 10:19 AM  Pager [phone]  If no answer or after 5 PM call [phone]

## 2015-11-15 NOTE — Progress Notes (Signed)
Pts blood pressure was found to be very elevated (see flowsheets); procedure cancelled per Dr Chaney Malling; Dr Bosie Clos notified and requests that patient be taken to the ER; pt okay with plan; will take to the emergency room for further evaluation and treatment; spoke with charge nurse in the ER to notify that patient will be brought down

## 2016-06-01 DIAGNOSIS — I639 Cerebral infarction, unspecified: Secondary | ICD-10-CM

## 2016-06-01 HISTORY — DX: Cerebral infarction, unspecified: I63.9

## 2016-06-28 ENCOUNTER — Inpatient Hospital Stay (HOSPITAL_COMMUNITY): Payer: 59

## 2016-06-28 ENCOUNTER — Inpatient Hospital Stay (HOSPITAL_COMMUNITY)
Admission: EM | Admit: 2016-06-28 | Discharge: 2016-07-04 | DRG: 064 | Disposition: A | Discharge status: Rehabilitation Facility | Payer: 59 | Attending: Neurology | Admitting: Neurology

## 2016-06-28 ENCOUNTER — Encounter (HOSPITAL_COMMUNITY): Payer: Self-pay | Admitting: *Deleted

## 2016-06-28 ENCOUNTER — Emergency Department (HOSPITAL_COMMUNITY): Payer: 59

## 2016-06-28 DIAGNOSIS — F17210 Nicotine dependence, cigarettes, uncomplicated: Secondary | ICD-10-CM | POA: Diagnosis present

## 2016-06-28 DIAGNOSIS — R197 Diarrhea, unspecified: Secondary | ICD-10-CM | POA: Diagnosis not present

## 2016-06-28 DIAGNOSIS — R402332 Coma scale, best motor response, abnormal, at arrival to emergency department: Secondary | ICD-10-CM | POA: Diagnosis present

## 2016-06-28 DIAGNOSIS — R509 Fever, unspecified: Secondary | ICD-10-CM

## 2016-06-28 DIAGNOSIS — F1010 Alcohol abuse, uncomplicated: Secondary | ICD-10-CM | POA: Diagnosis not present

## 2016-06-28 DIAGNOSIS — Z720 Tobacco use: Secondary | ICD-10-CM

## 2016-06-28 DIAGNOSIS — Z8261 Family history of arthritis: Secondary | ICD-10-CM

## 2016-06-28 DIAGNOSIS — F419 Anxiety disorder, unspecified: Secondary | ICD-10-CM | POA: Diagnosis present

## 2016-06-28 DIAGNOSIS — G8114 Spastic hemiplegia affecting left nondominant side: Secondary | ICD-10-CM | POA: Diagnosis present

## 2016-06-28 DIAGNOSIS — R29810 Facial weakness: Secondary | ICD-10-CM | POA: Diagnosis present

## 2016-06-28 DIAGNOSIS — D62 Acute posthemorrhagic anemia: Secondary | ICD-10-CM | POA: Diagnosis present

## 2016-06-28 DIAGNOSIS — Z899 Acquired absence of limb, unspecified: Secondary | ICD-10-CM

## 2016-06-28 DIAGNOSIS — R4701 Aphasia: Secondary | ICD-10-CM | POA: Diagnosis present

## 2016-06-28 DIAGNOSIS — K219 Gastro-esophageal reflux disease without esophagitis: Secondary | ICD-10-CM | POA: Diagnosis present

## 2016-06-28 DIAGNOSIS — I610 Nontraumatic intracerebral hemorrhage in hemisphere, subcortical: Secondary | ICD-10-CM | POA: Diagnosis present

## 2016-06-28 DIAGNOSIS — I10 Essential (primary) hypertension: Secondary | ICD-10-CM

## 2016-06-28 DIAGNOSIS — D638 Anemia in other chronic diseases classified elsewhere: Secondary | ICD-10-CM | POA: Diagnosis not present

## 2016-06-28 DIAGNOSIS — F1210 Cannabis abuse, uncomplicated: Secondary | ICD-10-CM | POA: Diagnosis present

## 2016-06-28 DIAGNOSIS — E785 Hyperlipidemia, unspecified: Secondary | ICD-10-CM | POA: Diagnosis present

## 2016-06-28 DIAGNOSIS — E872 Acidosis: Secondary | ICD-10-CM | POA: Diagnosis present

## 2016-06-28 DIAGNOSIS — I120 Hypertensive chronic kidney disease with stage 5 chronic kidney disease or end stage renal disease: Secondary | ICD-10-CM | POA: Diagnosis present

## 2016-06-28 DIAGNOSIS — E871 Hypo-osmolality and hyponatremia: Secondary | ICD-10-CM | POA: Diagnosis present

## 2016-06-28 DIAGNOSIS — R252 Cramp and spasm: Secondary | ICD-10-CM

## 2016-06-28 DIAGNOSIS — E1121 Type 2 diabetes mellitus with diabetic nephropathy: Secondary | ICD-10-CM | POA: Diagnosis present

## 2016-06-28 DIAGNOSIS — E1142 Type 2 diabetes mellitus with diabetic polyneuropathy: Secondary | ICD-10-CM

## 2016-06-28 DIAGNOSIS — N185 Chronic kidney disease, stage 5: Secondary | ICD-10-CM | POA: Diagnosis present

## 2016-06-28 DIAGNOSIS — R071 Chest pain on breathing: Secondary | ICD-10-CM | POA: Diagnosis not present

## 2016-06-28 DIAGNOSIS — E11610 Type 2 diabetes mellitus with diabetic neuropathic arthropathy: Secondary | ICD-10-CM | POA: Diagnosis present

## 2016-06-28 DIAGNOSIS — Z96643 Presence of artificial hip joint, bilateral: Secondary | ICD-10-CM | POA: Diagnosis present

## 2016-06-28 DIAGNOSIS — Z89431 Acquired absence of right foot: Secondary | ICD-10-CM

## 2016-06-28 DIAGNOSIS — N184 Chronic kidney disease, stage 4 (severe): Secondary | ICD-10-CM | POA: Diagnosis not present

## 2016-06-28 DIAGNOSIS — M25461 Effusion, right knee: Secondary | ICD-10-CM | POA: Diagnosis present

## 2016-06-28 DIAGNOSIS — F4323 Adjustment disorder with mixed anxiety and depressed mood: Secondary | ICD-10-CM | POA: Diagnosis present

## 2016-06-28 DIAGNOSIS — Z8249 Family history of ischemic heart disease and other diseases of the circulatory system: Secondary | ICD-10-CM

## 2016-06-28 DIAGNOSIS — E1122 Type 2 diabetes mellitus with diabetic chronic kidney disease: Secondary | ICD-10-CM | POA: Diagnosis present

## 2016-06-28 DIAGNOSIS — G935 Compression of brain: Secondary | ICD-10-CM | POA: Diagnosis not present

## 2016-06-28 DIAGNOSIS — Z833 Family history of diabetes mellitus: Secondary | ICD-10-CM

## 2016-06-28 DIAGNOSIS — I160 Hypertensive urgency: Secondary | ICD-10-CM

## 2016-06-28 DIAGNOSIS — R402112 Coma scale, eyes open, never, at arrival to emergency department: Secondary | ICD-10-CM | POA: Diagnosis present

## 2016-06-28 DIAGNOSIS — I161 Hypertensive emergency: Secondary | ICD-10-CM | POA: Diagnosis present

## 2016-06-28 DIAGNOSIS — I619 Nontraumatic intracerebral hemorrhage, unspecified: Secondary | ICD-10-CM

## 2016-06-28 DIAGNOSIS — R159 Full incontinence of feces: Secondary | ICD-10-CM

## 2016-06-28 DIAGNOSIS — M25561 Pain in right knee: Secondary | ICD-10-CM

## 2016-06-28 DIAGNOSIS — G8194 Hemiplegia, unspecified affecting left nondominant side: Secondary | ICD-10-CM | POA: Diagnosis not present

## 2016-06-28 DIAGNOSIS — G936 Cerebral edema: Secondary | ICD-10-CM | POA: Diagnosis present

## 2016-06-28 DIAGNOSIS — R471 Dysarthria and anarthria: Secondary | ICD-10-CM | POA: Diagnosis present

## 2016-06-28 DIAGNOSIS — N189 Chronic kidney disease, unspecified: Secondary | ICD-10-CM

## 2016-06-28 DIAGNOSIS — R402232 Coma scale, best verbal response, inappropriate words, at arrival to emergency department: Secondary | ICD-10-CM | POA: Diagnosis present

## 2016-06-28 DIAGNOSIS — Z87442 Personal history of urinary calculi: Secondary | ICD-10-CM

## 2016-06-28 DIAGNOSIS — R4182 Altered mental status, unspecified: Secondary | ICD-10-CM

## 2016-06-28 DIAGNOSIS — Z87820 Personal history of traumatic brain injury: Secondary | ICD-10-CM

## 2016-06-28 DIAGNOSIS — Z09 Encounter for follow-up examination after completed treatment for conditions other than malignant neoplasm: Secondary | ICD-10-CM

## 2016-06-28 DIAGNOSIS — K7030 Alcoholic cirrhosis of liver without ascites: Secondary | ICD-10-CM | POA: Diagnosis present

## 2016-06-28 DIAGNOSIS — I6789 Other cerebrovascular disease: Secondary | ICD-10-CM | POA: Diagnosis not present

## 2016-06-28 DIAGNOSIS — R29722 NIHSS score 22: Secondary | ICD-10-CM | POA: Diagnosis present

## 2016-06-28 DIAGNOSIS — M10061 Idiopathic gout, right knee: Secondary | ICD-10-CM | POA: Diagnosis present

## 2016-06-28 DIAGNOSIS — D649 Anemia, unspecified: Secondary | ICD-10-CM

## 2016-06-28 DIAGNOSIS — N179 Acute kidney failure, unspecified: Secondary | ICD-10-CM

## 2016-06-28 LAB — CBC WITH DIFFERENTIAL/PLATELET
Basophils Absolute: 0 10*3/uL (ref 0.0–0.1)
Basophils Relative: 0 %
Eosinophils Absolute: 0 10*3/uL (ref 0.0–0.7)
Eosinophils Relative: 0 %
HEMATOCRIT: 27.3 % — AB (ref 39.0–52.0)
HEMOGLOBIN: 9 g/dL — AB (ref 13.0–17.0)
LYMPHS ABS: 0.8 10*3/uL (ref 0.7–4.0)
LYMPHS PCT: 7 %
MCH: 29.8 pg (ref 26.0–34.0)
MCHC: 33 g/dL (ref 30.0–36.0)
MCV: 90.4 fL (ref 78.0–100.0)
MONO ABS: 0.8 10*3/uL (ref 0.1–1.0)
MONOS PCT: 7 %
NEUTROS ABS: 10 10*3/uL — AB (ref 1.7–7.7)
Neutrophils Relative %: 86 %
Platelets: 324 10*3/uL (ref 150–400)
RBC: 3.02 MIL/uL — ABNORMAL LOW (ref 4.22–5.81)
RDW: 14.2 % (ref 11.5–15.5)
WBC: 11.6 10*3/uL — ABNORMAL HIGH (ref 4.0–10.5)

## 2016-06-28 LAB — URINALYSIS, ROUTINE W REFLEX MICROSCOPIC
Bilirubin Urine: NEGATIVE
GLUCOSE, UA: 250 mg/dL — AB
Ketones, ur: NEGATIVE mg/dL
LEUKOCYTES UA: NEGATIVE
Nitrite: NEGATIVE
Protein, ur: >300 mg/dL — AB
SPECIFIC GRAVITY, URINE: 1.026 (ref 1.005–1.030)
pH: 6 (ref 5.0–8.0)

## 2016-06-28 LAB — COMPREHENSIVE METABOLIC PANEL
ALBUMIN: 2.5 g/dL — AB (ref 3.5–5.0)
ALK PHOS: 208 U/L — AB (ref 38–126)
ALT: 22 U/L (ref 17–63)
ANION GAP: 14 (ref 5–15)
AST: 41 U/L (ref 15–41)
BUN: 26 mg/dL — ABNORMAL HIGH (ref 6–20)
CALCIUM: 8.9 mg/dL (ref 8.9–10.3)
CHLORIDE: 98 mmol/L — AB (ref 101–111)
CO2: 17 mmol/L — AB (ref 22–32)
Creatinine, Ser: 3.78 mg/dL — ABNORMAL HIGH (ref 0.61–1.24)
GFR calc Af Amer: 20 mL/min — ABNORMAL LOW (ref >60)
GFR calc non Af Amer: 17 mL/min — ABNORMAL LOW (ref >60)
GLUCOSE: 150 mg/dL — AB (ref 65–99)
Potassium: 3.9 mmol/L (ref 3.5–5.1)
SODIUM: 129 mmol/L — AB (ref 135–145)
Total Bilirubin: 0.8 mg/dL (ref 0.3–1.2)
Total Protein: 5.8 g/dL — ABNORMAL LOW (ref 6.5–8.1)

## 2016-06-28 LAB — RAPID URINE DRUG SCREEN, HOSP PERFORMED
AMPHETAMINES: NOT DETECTED
BENZODIAZEPINES: NOT DETECTED
Barbiturates: NOT DETECTED
Cocaine: NOT DETECTED
Opiates: NOT DETECTED
TETRAHYDROCANNABINOL: POSITIVE — AB

## 2016-06-28 LAB — URINE MICROSCOPIC-ADD ON

## 2016-06-28 LAB — I-STAT CG4 LACTIC ACID, ED: Lactic Acid, Venous: 3.25 mmol/L (ref 0.5–1.9)

## 2016-06-28 LAB — ETHANOL

## 2016-06-28 LAB — CBG MONITORING, ED: GLUCOSE-CAPILLARY: 154 mg/dL — AB (ref 65–99)

## 2016-06-28 LAB — GLUCOSE, CAPILLARY: Glucose-Capillary: 114 mg/dL — ABNORMAL HIGH (ref 65–99)

## 2016-06-28 LAB — MRSA PCR SCREENING: MRSA by PCR: NEGATIVE

## 2016-06-28 LAB — AMMONIA: Ammonia: 34 umol/L (ref 9–35)

## 2016-06-28 MED ORDER — NICARDIPINE HCL IN NACL 20-0.86 MG/200ML-% IV SOLN
0.0000 mg/h | INTRAVENOUS | Status: DC
  Administered 2016-06-28: 10 mg/h via INTRAVENOUS
  Administered 2016-06-29: 3 mg/h via INTRAVENOUS
  Administered 2016-06-29: 5 mg/h via INTRAVENOUS
  Administered 2016-06-30: 3 mg/h via INTRAVENOUS
  Administered 2016-06-30: 5 mg/h via INTRAVENOUS
  Administered 2016-06-30: 3 mg/h via INTRAVENOUS
  Administered 2016-06-30: 5 mg/h via INTRAVENOUS
  Filled 2016-06-28 (×7): qty 200

## 2016-06-28 MED ORDER — PANTOPRAZOLE SODIUM 40 MG PO TBEC
40.0000 mg | DELAYED_RELEASE_TABLET | Freq: Every day | ORAL | Status: DC
  Administered 2016-06-29 – 2016-07-04 (×6): 40 mg via ORAL
  Filled 2016-06-28 (×6): qty 1

## 2016-06-28 MED ORDER — LORAZEPAM 2 MG/ML IJ SOLN
1.0000 mg | Freq: Once | INTRAMUSCULAR | Status: AC
  Administered 2016-06-28: 1 mg via INTRAVENOUS
  Filled 2016-06-28: qty 1

## 2016-06-28 MED ORDER — ACETAMINOPHEN 325 MG PO TABS
650.0000 mg | ORAL_TABLET | ORAL | Status: DC | PRN
  Administered 2016-06-30 – 2016-07-02 (×4): 650 mg via ORAL
  Filled 2016-06-28 (×4): qty 2

## 2016-06-28 MED ORDER — NICARDIPINE HCL IN NACL 20-0.86 MG/200ML-% IV SOLN
INTRAVENOUS | Status: AC
  Filled 2016-06-28: qty 200

## 2016-06-28 MED ORDER — ACETAMINOPHEN 650 MG RE SUPP: 650.0000 mg | RECTAL | Status: DC | PRN

## 2016-06-28 MED ORDER — NICARDIPINE HCL IN NACL 20-0.86 MG/200ML-% IV SOLN
3.0000 mg/h | Freq: Once | INTRAVENOUS | Status: AC
  Administered 2016-06-28: 5 mg/h via INTRAVENOUS
  Filled 2016-06-28: qty 200

## 2016-06-28 MED ORDER — FAMOTIDINE IN NACL 20-0.9 MG/50ML-% IV SOLN: 20.0000 mg | Freq: Two times a day (BID) | INTRAVENOUS | Status: DC

## 2016-06-28 MED ORDER — SODIUM CHLORIDE 0.9 % IV SOLN
INTRAVENOUS | Status: DC
  Administered 2016-06-28 – 2016-07-02 (×5): via INTRAVENOUS
  Administered 2016-07-03: 1000 mL via INTRAVENOUS
  Administered 2016-07-04: 08:00:00 via INTRAVENOUS

## 2016-06-28 MED ORDER — STROKE: EARLY STAGES OF RECOVERY BOOK
Freq: Once | Status: AC
  Administered 2016-06-28: 06:00:00
  Filled 2016-06-28: qty 1

## 2016-06-28 NOTE — Progress Notes (Signed)
*  Preliminary Results* There is no obvious evidence of hemodynamically significant internal carotid artery stenosis bilaterally. Vertebral arteries are patent with antegrade flow.  06/28/2016 5:36 PM  Gertie Fey, B.S., RVT, RDCS, RDMS

## 2016-06-28 NOTE — ED Notes (Signed)
Dr Stewart at bedside.

## 2016-06-28 NOTE — ED Notes (Signed)
Pt CBG is 154. Nurse notified.

## 2016-06-28 NOTE — Care Management Note (Signed)
Case Management Note  Patient Details  Name: Wesley Burns MRN: [id number] Date of Birth: 04-15-1966  Subjective/Objective:   Pt admitted on 06/28/16 s/p Rt basal ganglia hemorrhage.  PTA, pt independent, lives with boyfriend.                 Action/Plan: Will follow for discharge planning as pt progresses.  PT/OT consults pending.    Expected Discharge Date:                  Expected Discharge Plan:  IP Rehab Facility  In-House Referral:  Clinical Social Work  Discharge planning Services  CM Consult  Post Acute Care Choice:    Choice offered to:     DME Arranged:    DME Agency:     HH Arranged:    HH Agency:     Status of Service:  In process, will continue to follow  If discussed at Long Length of Stay Meetings, dates discussed:    Additional Comments:  Quintella Baton, RN, BSN  Trauma/Neuro ICU Case Manager [phone]

## 2016-06-28 NOTE — ED Notes (Signed)
Critical lactic handed to Dr Preston Fleeting.

## 2016-06-28 NOTE — ED Notes (Signed)
Dr. Glick at bedside.  
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
## 2016-06-28 NOTE — ED Notes (Signed)
Pt's spouse went home for the night, will return in the morning.

## 2016-06-28 NOTE — Progress Notes (Signed)
PT Cancellation Note  Patient Details Name: Wesley Burns MRN: [id number] DOB: 1966/04/01   Cancelled Treatment:    Reason Eval/Treat Not Completed: Patient not medically ready, active bedrest orders at this time   Fabio Asa 06/28/2016, 7:33 AM Charlotte Crumb, PT DPT  [phone]

## 2016-06-28 NOTE — Progress Notes (Signed)
Stroke Team Progress Note  HISTORY Wesley Burns is an 50 y.o. male history of diabetes mellitus, hypertension, chronic kidney disease, anemia and osteomyelitis, brought to the emergency room after being found unable to speak and move his left side. Patient was last known well on 06/25/2016. His significant other spoke with him at 5:44 PM on 06/27/2016 and speech was slurred, but patient was coherent. He has no previous history of stroke nor TIA. Blood pressure was markedly elevated on presentation, requiring acute intervention with Cardene drip. CT scan of his head showed a fairly large right basal ganglia acute hematoma with mass effect, including a 4 mm right to left midline shift. There was no hemorrhage into the right lateral ventricle. NIH stroke score was 22. LSN: 06/25/2016 tPA Given: No: Acute ICH Patient was not administered TPA secondary to ICH. He was admitted to the neuro ICU  for further evaluation and treatment.  GCS 3-4: NO GCS <5: NO GCS 5-12: NO Infratentorial origin: NO Age >= 80: NO ICH volume >= 40ml:  NO Intraventricular Hemorrhage: NO ICH Score: 0        SUBJECTIVE His RNis at the bedside.  Overall he feels his condition is stable. Patient's blood pressure has been adequately controlled on Cardene drip. He has been drowsy but his woke p for my exam.  OBJECTIVE Most recent Vital Signs: Vitals:   06/28/16 0945 06/28/16 1000 06/28/16 1015 06/28/16 1030  BP: 138/83 134/73 123/82 (!) 139/121  Pulse: 93 89 84 99  Resp: 18 16 14  (!) 22  Temp: 97.7 F (36.5 C) 97.9 F (36.6 C) 98.1 F (36.7 C) 98.1 F (36.7 C)  TempSrc:      SpO2: 97% 98% 92% 97%  Weight:      Height:       CBG (last 3)   Recent Labs  06/28/16 0245 06/28/16 0552  GLUCAP 154* 114*    IV Fluid Intake:   . sodium chloride 50 mL/hr at 06/28/16 1000  . niCARDipine 5 mg/hr (06/28/16 1000)    MEDICATIONS  . famotidine (PEPCID) IV  20 mg Intravenous Q12H  . pantoprazole  40 mg Oral Daily    PRN:  acetaminophen **OR** acetaminophen  Diet:  Diet NPO time specified   Activity:  Bedrest  DVT Prophylaxis:  SCDs  CLINICALLY SIGNIFICANT STUDIES Basic Metabolic Panel:  Recent Labs Lab 06/28/16 0240  NA 129*  K 3.9  CL 98*  CO2 17*  GLUCOSE 150*  BUN 26*  CREATININE 3.78*  CALCIUM 8.9   Liver Function Tests:  Recent Labs Lab 06/28/16 0240  AST 41  ALT 22  ALKPHOS 208*  BILITOT 0.8  PROT 5.8*  ALBUMIN 2.5*   CBC:  Recent Labs Lab 06/28/16 0240  WBC 11.6*  NEUTROABS 10.0*  HGB 9.0*  HCT 27.3*  MCV 90.4  PLT 324   Coagulation: No results for input(s): LABPROT, INR in the last 168 hours. Cardiac Enzymes: No results for input(s): CKTOTAL, CKMB, CKMBINDEX, TROPONINI in the last 168 hours. Urinalysis:  Recent Labs Lab 06/28/16 0319  COLORURINE YELLOW  LABSPEC 1.026  PHURINE 6.0  GLUCOSEU 250*  HGBUR MODERATE*  BILIRUBINUR NEGATIVE  KETONESUR NEGATIVE  PROTEINUR >300*  NITRITE NEGATIVE  LEUKOCYTESUR NEGATIVE   Lipid Panel    Component Value Date/Time   CHOL 142 04/21/2015 0438   TRIG 81 04/21/2015 0438   HDL 78 04/21/2015 0438   CHOLHDL 1.8 04/21/2015 0438   VLDL 16 04/21/2015 0438   LDLCALC 48 04/21/2015 0438  HgbA1C  Lab Results  Component Value Date   HGBA1C 4.7 (L) 04/21/2015    Urine Drug Screen:     Component Value Date/Time   LABOPIA NONE DETECTED 06/28/2016 0320   COCAINSCRNUR NONE DETECTED 06/28/2016 0320   LABBENZ NONE DETECTED 06/28/2016 0320   AMPHETMU NONE DETECTED 06/28/2016 0320   THCU POSITIVE (A) 06/28/2016 0320   LABBARB NONE DETECTED 06/28/2016 0320    Alcohol Level:  Recent Labs Lab 06/28/16 0240  ETH <5    Ct Head Wo Contrast  Result Date: 06/28/2016 CLINICAL DATA:  50 year old male found unresponsive. History of cocaine use. EXAM: CT HEAD WITHOUT CONTRAST TECHNIQUE: Contiguous axial images were obtained from the base of the skull through the vertex without intravenous contrast. COMPARISON:  None.  FINDINGS: There is a 2.4 x 4.4 cm right basal ganglia hemorrhage with mild surrounding edema. There is mild associated mass effect on the right lateral ventricle and approximately 4 mm right to left midline shift. No other acute intracranial hemorrhage identified. The ventricles and sulci are appropriate in size for the patient's age. Minimal periventricular and deep white matter chronic microvascular ischemic changes noted. The visualized paranasal sinuses are clear. There is partial opacification of mastoid air cells bilaterally. The calvarium is intact. IMPRESSION: Right basal ganglia acute hemorrhage with mild mass effect and 4 mm right-to-left midline shift. Close follow-up recommended. These results were called by telephone at the time of interpretation on 06/28/2016 at 4:10 am to Dr. Dione Booze , who verbally acknowledged these results. Electronically Signed   By: Elgie Collard M.D.   On: 06/28/2016 04:14  Dg Chest Port 1 View  Result Date: 06/28/2016 CLINICAL DATA:  50 year old male with unresponsiveness and altered mental status EXAM: PORTABLE CHEST 1 VIEW COMPARISON:  Chest radiograph dated 06/19/2015 FINDINGS: Single portable view of the chest demonstrates clear lungs. There is no pleural effusion or pneumothorax. The cardiac silhouette is within normal limits. Left humeral orthopedic hardware. No acute osseous pathology. IMPRESSION: No active disease. Electronically Signed   By: Elgie Collard M.D.   On: 06/28/2016 05:03        MRI of the brain  ordered  MRA of the brain  See CTA  Carotid Doppler  See CTA  2D Echocardiogram  pending  CXR  06/28/16 No active disease  EKG  Sinus tachycardia.Low voltage  Therapy Recommendations  pending Physical Exam   Middle-aged Caucasian male currently not in distress. . Afebrile. Head is nontraumatic. Neck is supple without bruit.    Cardiac exam no murmur or gallop. Lungs are clear to auscultation. Distal pulses are well felt. Right foot  transmetatarsal amputation Neurological Exam :  Patient is drowsy but can be aroused. Opens eyes and follows simple commands and speaks short sentences. Pupils equal reactive. No gaze deviation. Eye movements are full range without nystagmus. Blinks to threat bilaterally. Fundi were not visualized. Left lower facial weakness. Slight dysarthria. Tongue midline. Follows simple midline and one-step commands only. Left hemiplegia. Withdraws left lower extremity more than left upper extremity to pain. Purposeful movements on right side. Right leg transmetatarsal amputation. ASSESSMENT Mr. DENTON DERKS is a 50 y.o. male presenting with  confusion, speech difficulty and left hemiplegia secondary to  2.4 x 4.4 x 2.5 cm right subcortical  Hemorrhage with mild cytotoxic edema and  4 mm midline shift likely due to malignant hypertension with marijuana abuse       Hospital day # 0  TREATMENT/PLAN  Patient has presented with right brain  subcortical hemorrhage secondary to uncontrolled hypertension and remains at risk for neurological worsening, hematoma expansion, development of hydrocephalus and needs close neurological monitoring and aggressive blood pressure control. Continue Cardene drip for now with blood pressure goal systolic below 160 for 24 hrs. followed by goal below 180. Keep nothing by mouth for now until he passes swallow eval by speech therapy. Repeat imaging in 12 hours and check CT angiogram of the brain and neck. Will order MRI for tomorrow. DVT and GI prophylaxis. Keep normothermic, euglycemic and euvolemic. Family not available at the bedside for discussion.  This patient is critically ill and at significant risk of neurological worsening, death and care requires constant monitoring of vital signs, hemodynamics,respiratory and cardiac monitoring, extensive review of multiple databases, frequent neurological assessment, discussion with family, other specialists and medical decision making of  high complexity.I have made any additions or clarifications directly to the above note.This critical care time does not reflect procedure time, or teaching time or supervisory time of PA/NP/Med Resident etc but could involve care discussion time.  I spent 50 minutes of neurocritical care time  in the care of  this patient.     Delia Heady, MD Medical Director [facility] Pager: [phone] 06/28/2016 11:25 AM    To contact Stroke Continuity provider, please refer to [email or web address]. After hours, contact General Neurology

## 2016-06-28 NOTE — ED Triage Notes (Signed)
Pt arrives via EMS from home. pts significant other came home from a week long trip and found pt to be unresponsive. S.O. Waited for an hour or so for pt to wake up but when he couldn't wake him up he called EMS. Upon EMS arrival pt was still unresponsive. EMS found white powder ([employer] running tests), marijuana and alcohol in room. EMS also noticed that pt had soiled himself and feces was in the bathroom and throughout the house. EMS notes that pt is flaccid to left side of body. EMS gave 5mg  Versed IM due to pt being combative en route. Pt currently groaning and attempting to pick at his left arm with his right arm.
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
## 2016-06-28 NOTE — Progress Notes (Signed)
OT Cancellation Note  Patient Details Name: DERRIK MCEACHERN MRN: [id number] DOB: 10/12/1966   Cancelled Treatment:    Reason Eval/Treat Not Completed: Patient not medically ready (active bedrest orders).  Gaye Alken M.S., OTR/L Pager: [phone]  06/28/2016, 7:22 AM
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
## 2016-06-29 ENCOUNTER — Inpatient Hospital Stay (HOSPITAL_COMMUNITY): Payer: 59

## 2016-06-29 DIAGNOSIS — I6789 Other cerebrovascular disease: Secondary | ICD-10-CM

## 2016-06-29 LAB — URINE CULTURE: CULTURE: NO GROWTH

## 2016-06-29 MED ORDER — FUROSEMIDE 10 MG/ML IJ SOLN
40.0000 mg | Freq: Every day | INTRAMUSCULAR | Status: DC
  Administered 2016-06-29 – 2016-07-02 (×4): 40 mg via INTRAVENOUS
  Filled 2016-06-29 (×4): qty 4

## 2016-06-29 MED ORDER — IRBESARTAN 150 MG PO TABS
75.0000 mg | ORAL_TABLET | Freq: Every day | ORAL | Status: DC
  Administered 2016-06-29 – 2016-07-03 (×5): 75 mg via ORAL
  Filled 2016-06-29 (×5): qty 1

## 2016-06-29 MED ORDER — POTASSIUM CHLORIDE CRYS ER 10 MEQ PO TBCR
10.0000 meq | EXTENDED_RELEASE_TABLET | Freq: Every day | ORAL | Status: DC
  Administered 2016-06-29 – 2016-07-04 (×6): 10 meq via ORAL
  Filled 2016-06-29 (×6): qty 1

## 2016-06-29 MED ORDER — DEXTROSE 5 % IV SOLN
1.0000 g | INTRAVENOUS | Status: DC
  Administered 2016-06-29 – 2016-07-01 (×3): 1 g via INTRAVENOUS
  Filled 2016-06-29 (×3): qty 10

## 2016-06-29 MED ORDER — LABETALOL HCL 5 MG/ML IV SOLN
10.0000 mg | INTRAVENOUS | Status: DC | PRN
  Administered 2016-06-29 – 2016-07-01 (×5): 10 mg via INTRAVENOUS
  Filled 2016-06-29 (×5): qty 4

## 2016-06-29 MED ORDER — PROPRANOLOL HCL 80 MG PO TABS
80.0000 mg | ORAL_TABLET | Freq: Two times a day (BID) | ORAL | Status: DC
  Administered 2016-06-29 – 2016-07-04 (×11): 80 mg via ORAL
  Filled 2016-06-29 (×12): qty 1

## 2016-06-29 NOTE — Progress Notes (Signed)
STROKE TEAM PROGRESS NOTE   HISTORY OF PRESENT ILLNESS (per record) Wesley Burns is an 50 y.o. male history of diabetes mellitus, hypertension, chronic kidney disease, anemia and osteomyelitis, brought to the emergency room after being found unable to speak and move his left side. Patient was last known well on 06/25/2016. His significant other spoke with him at 5:44 PM on 06/27/2016 and speech was slurred, but patient was coherent. He has no previous history of stroke nor TIA. Blood pressure was markedly elevated on presentation, requiring acute intervention with Cardene drip. CT scan of his head showed a fairly large right basal ganglia acute hematoma with mass effect, including a 4 mm right to left midline shift. There was no hemorrhage into the right lateral ventricle. NIH stroke score was 22.  LSN: 06/25/2016 tPA Given: No: Acute ICH mRankin:   SUBJECTIVE (INTERVAL HISTORY) His significant other is not at the bedside.  RN at the bedside.  No events overnight.  Full ROS performed and there were no pertinent positives  Overall he feels his condition is gradually improving. We reviewed his diagnosis.  He was tearful and stated feeling "scared."  We reassured him and discussed the plan for the day   OBJECTIVE Temp:  [97.5 F (36.4 C)-99.5 F (37.5 C)] 99 F (37.2 C) (07/29 0742) Pulse Rate:  [74-99] 83 (07/29 0700) Cardiac Rhythm: Normal sinus rhythm (07/28 2000) Resp:  [13-23] 13 (07/29 0700) BP: (119-162)/(68-121) 146/86 (07/29 0700) SpO2:  [92 %-100 %] 100 % (07/29 0700)  CBC:  Recent Labs Lab 06/28/16 0240  WBC 11.6*  NEUTROABS 10.0*  HGB 9.0*  HCT 27.3*  MCV 90.4  PLT 324    Basic Metabolic Panel:  Recent Labs Lab 06/28/16 0240  NA 129*  K 3.9  CL 98*  CO2 17*  GLUCOSE 150*  BUN 26*  CREATININE 3.78*  CALCIUM 8.9    Lipid Panel:    Component Value Date/Time   CHOL 142 04/21/2015 0438   TRIG 81 04/21/2015 0438   HDL 78 04/21/2015 0438   CHOLHDL 1.8  04/21/2015 0438   VLDL 16 04/21/2015 0438   LDLCALC 48 04/21/2015 0438   HgbA1c:  Lab Results  Component Value Date   HGBA1C 4.7 (L) 04/21/2015   Urine Drug Screen:    Component Value Date/Time   LABOPIA NONE DETECTED 06/28/2016 0320   COCAINSCRNUR NONE DETECTED 06/28/2016 0320   LABBENZ NONE DETECTED 06/28/2016 0320   AMPHETMU NONE DETECTED 06/28/2016 0320   THCU POSITIVE (A) 06/28/2016 0320   LABBARB NONE DETECTED 06/28/2016 0320      IMAGING  Ct Head Wo Contrast 06/29/2016 1. No significant interval change in right basal ganglia hemorrhage with similar localized vasogenic edema and mild 3 mm of right-to-left shift.  2. No other new acute intracranial process.    Ct Head Wo Contrast 06/28/2016 Right basal ganglia hemorrhage is unchanged. Mild midline shift to the left unchanged. No significant change since earlier today.    Ct Head Wo Contrast 06/28/2016 Right basal ganglia acute hemorrhage with mild mass effect and 4 mm right-to-left midline shift. Close follow-up recommended.    Dg Chest Port 1 View Result Date: 06/28/2016 No active disease.     PHYSICAL EXAM HEENT-  PERRL, sclera clear, Normocephalic, no lesions, without obvious abnormality.  Normal external eye and conjunctiva. Neck supple with no masses, nodes, nodules or enlargement. Cardiovascular - regular rate and rhythm and systolic murmur: early systolic 2/6, crescendo at 2nd right intercostal space Lungs -  chest clear, no wheezing, rales, normal symmetric air entry Abdomen - soft, non-tender; bowel sounds normal; no masses,  no organomegaly Extremities - transmetatarsal amputation of right foot  Neurologic Examination: Patient alert, followed all commands.  Fluent.  Oriented x 3.  He was no longer agitated.   Pupils were equal and reacted normally to light. Extraocular movements were full and conjugate.  Mild left lower facial weakness was noted.  There was minimal left upper extremity drift.     He has normal strength on the right. 4/5 on the left Sensory exam intact to LT bilaterally Cerebellar:  FN intact bilaterally relative to motor strength   ASSESSMENT/PLAN [name] is a 50 y.o. male with history of Type 2 diabetes mellitus, osteomyelitis of the right foot, neuropathy, hypertension, history of renal calculi, previous head injury, gout, elevated liver function tests, diabetic foot ulcers, depression, anxiety, and anemia presenting with aphasia and left hemiplegia. He did not receive IV t-PA due to hemorrhage.  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  Resultant  Left sided weakness  MRI pending  MRA  pending  Carotid Doppler pending  2D Echo - 04/21/2015 - EF 55-60%. No cardiac source of emboli identified.  LDL - will order  HgbA1c pending  VTE prophylaxis - SCDs  Diet NPO time specified  No antithrombotic prior to admission, now on No antithrombotic secondary to hemorrhage  Ongoing aggressive stroke risk factor management  Therapy recommendations:  Pending  Disposition: Pending  Hypertension  Stable  Long-term BP goal normotensive  Hyperlipidemia  Home meds: No lipid lowering medications prior to admission  LDL not performed, goal < 70  We will check lipid panel  Continue statin at discharge  Diabetes  HgbA1c pending, goal < 7.0   Other Stroke Risk Factors  Cigarette smoker advised to stop smoking  ETOH use, advised to drink no more than 1 drink(s) a day    Other Active Problems  UDS positive for THCU  Hyponatremia - 129  Chronic kidney disease - creatinine 3.78 ; BUN 26  Anemia - hemoglobin 9 ; hematocrit 27.3  Mild leukocytosis - wbc's 11.6  Hospital day # 1  CRITICAL CARE NEUROLOGY ATTENDING NOTE Patient was seen and examined by me personally. I independently viewed imaging studies, participated in medical decision making and plan of care. The  laboratory and radiographic studies were personally reviewed by me.  ROS completed by me personally and pertinent positives fully documented.  Assessment and plan completed by me personally and fully documented above.  PLAN FOR TODAY:  MRI of head pending  ECHO ordered  Hyponatremia:  Etiology unclear  Normocytic anemia.  Will follow  Leukocytosis:  Will repeat CBC.  UTI on admission.  Started Rocephin.  Pharmacy to consult for dosing  Condition is improved   This patient is critically ill and at significant risk of neurological worsening, death and care requires constant monitoring of vital signs, hemodynamics,respiratory and cardiac monitoring, extensive review of multiple databases, frequent neurological assessment, discussion with family, other specialists and medical decision making of high complexity.  This critical care time does not reflect procedure time, or teaching time or supervisory time of PA/NP/Med Resident etc. but could involve care discussion time.  I spent 30 minutes of Neurocritical Care time in the care of  this patient.  SIGNED BY: Dr. Sula Soda      To contact Stroke Continuity provider, please refer to [email or web address]. After hours, contact General Neurology

## 2016-06-29 NOTE — Progress Notes (Signed)
PT Cancellation Note  Patient Details Name: LOVE MILBOURNE MRN: [id number] DOB: 16-Apr-1966   Cancelled Treatment:     Remains on bedrest at this time, will follow   Fabio Asa 06/29/2016, 7:22 AM Charlotte Crumb, PT DPT  [phone]

## 2016-06-29 NOTE — Progress Notes (Signed)
  Echocardiogram 2D Echocardiogram has been performed.  Janalyn Harder 06/29/2016, 3:19 PM

## 2016-06-29 NOTE — Progress Notes (Signed)
Speech Language Pathology Treatment: Dysphagia  Patient Details Name: Wesley Burns MRN: [id number] DOB: Feb 05, 1966 Today's Date: 06/29/2016 Time: 0935-1000 SLP Time Calculation (min) (ACUTE ONLY): 25 min  Assessment / Plan / Recommendation Clinical Impression  Patient's RN called SLP and said that patient's alertness was significantly improved as compared to yesterday (7/28). When SLP entered room, patient was awake, alert, stated he was "thirsty". He exhibited left facial weakness and droop, exhibited mild anterior spillage on left side of mouth when drinking thin liquids. He was able to masticate and swallow hard solid (graham cracker) without difficulty and not observed left sided pocketing or holding of bolus. He exhibited a immediate throat clear when taking sips of thin liquids via straw, and when taking large and/or successive sips from the cup (gulping). When patient was instructed by SLP to take sip and swallow before taking another, he did not exhibit any overt s/s of aspiration.   HPI HPI: Wesley Burns is a 50 y.o. male with PMH of diabetes mellitus, hypertension, chronic kidney disease, anemia and osteomyelitis, brought to the emergency room 7/28 after being found unable to speak and move his left side. Head CT showed R basal ganglia acute hemorrhage with mild mass effect. CXR clear. Wesley Burns failed RN stroke swallow screen- not awake/ alert/ responding to speech- bedside swallow eval ordered.      SLP Plan  Continue with current plan of care  Initiate diet as indicated below, monitor tolerance     Recommendations  Diet recommendations: Regular;Thin liquid Liquids provided via: No straw Medication Administration: Whole meds with liquid Supervision: Patient able to self feed;Intermittent supervision to cue for compensatory strategies Compensations: Slow rate;Small sips/bites;Monitor for anterior loss Postural Changes and/or Swallow Maneuvers: Seated upright 90 degrees;Upright 30-60 min after  meal             Oral Care Recommendations: Oral care QID Follow up Recommendations: Other (comment) (TBD) Plan: Continue with current plan of care     Angela Nevin, MA, CCC-SLP 06/29/16 1:15 PM Phone: [phone] Fax: [phone]

## 2016-06-29 NOTE — Progress Notes (Signed)
Ceftriaxone for UTI per pharmacy ordered.  Ceftriaxone does not need renal adjustment.  [employer] policy allows pharmacy to change the ordered dose based on indication without contacting the provider, therefore a consult is not required   Plan: -Ceftriaxone 1 g IV q24h -Pharmacy to sign off, please re-consult as needed    Agapito Games, PharmD, BCPS Clinical Pharmacist 06/29/2016 9:23 AM

## 2016-06-30 DIAGNOSIS — R252 Cramp and spasm: Secondary | ICD-10-CM

## 2016-06-30 DIAGNOSIS — R197 Diarrhea, unspecified: Secondary | ICD-10-CM

## 2016-06-30 DIAGNOSIS — M62838 Other muscle spasm: Secondary | ICD-10-CM

## 2016-06-30 LAB — CBC
HCT: 22.4 % — ABNORMAL LOW (ref 39.0–52.0)
HEMATOCRIT: 21.7 % — AB (ref 39.0–52.0)
Hemoglobin: 7.5 g/dL — ABNORMAL LOW (ref 13.0–17.0)
Hemoglobin: 7.4 g/dL — ABNORMAL LOW (ref 13.0–17.0)
MCH: 30 pg (ref 26.0–34.0)
MCH: 30.3 pg (ref 26.0–34.0)
MCHC: 33.5 g/dL (ref 30.0–36.0)
MCHC: 34.1 g/dL (ref 30.0–36.0)
MCV: 89.6 fL (ref 78.0–100.0)
MCV: 88.9 fL (ref 78.0–100.0)
PLATELETS: 239 10*3/uL (ref 150–400)
PLATELETS: 261 10*3/uL (ref 150–400)
RBC: 2.5 MIL/uL — AB (ref 4.22–5.81)
RBC: 2.44 MIL/uL — ABNORMAL LOW (ref 4.22–5.81)
RDW: 14.2 % (ref 11.5–15.5)
RDW: 14.3 % (ref 11.5–15.5)
WBC: 7.6 10*3/uL (ref 4.0–10.5)
WBC: 10.8 10*3/uL — AB (ref 4.0–10.5)

## 2016-06-30 LAB — ECHOCARDIOGRAM COMPLETE
AOASC: 33 cm
AVLVOTPG: 5 mmHg
CHL CUP RV SYS PRESS: 48 mmHg
CHL CUP TV REG PEAK VELOCITY: 286 cm/s
FS: 26 % — AB (ref 28–44)
Height: 68 in
IVS/LV PW RATIO, ED: 0.87
LADIAMINDEX: 2.28 cm/m2
LASIZE: 42 mm
LAVOL: 52 mL
LAVOLA4C: 53.3 mL
LAVOLIN: 28.2 mL/m2
LEFT ATRIUM END SYS DIAM: 42 mm
LV PW d: 14.8 mm — AB (ref 0.6–1.1)
LV TDI E'MEDIAL: 6.96
LVELAT: 11.9 cm/s
LVOT SV: 86 mL
LVOT VTI: 27.5 cm
LVOT area: 3.14 cm2
LVOT diameter: 20 mm
LVOT peak vel: 115 cm/s
LVSYSVOL: 25 mL (ref 21–61)
LVSYSVOLIN: 13 mL/m2
TAPSE: 26.5 mm
TDI e' lateral: 11.9
TRMAXVEL: 286 cm/s
Weight: 2490.32 oz

## 2016-06-30 LAB — MAGNESIUM: Magnesium: 1.1 mg/dL — ABNORMAL LOW (ref 1.7–2.4)

## 2016-06-30 LAB — BASIC METABOLIC PANEL
ANION GAP: 7 (ref 5–15)
BUN: 22 mg/dL — ABNORMAL HIGH (ref 6–20)
CO2: 17 mmol/L — ABNORMAL LOW (ref 22–32)
Calcium: 7.7 mg/dL — ABNORMAL LOW (ref 8.9–10.3)
Chloride: 105 mmol/L (ref 101–111)
Creatinine, Ser: 3.57 mg/dL — ABNORMAL HIGH (ref 0.61–1.24)
GFR, EST AFRICAN AMERICAN: 21 mL/min — AB (ref >60)
GFR, EST NON AFRICAN AMERICAN: 18 mL/min — AB (ref >60)
Glucose, Bld: 100 mg/dL — ABNORMAL HIGH (ref 65–99)
POTASSIUM: 3.5 mmol/L (ref 3.5–5.1)
SODIUM: 129 mmol/L — AB (ref 135–145)

## 2016-06-30 LAB — LIPID PANEL
CHOL/HDL RATIO: 2.5 ratio
CHOLESTEROL: 157 mg/dL (ref 0–200)
HDL: 64 mg/dL (ref >40)
LDL Cholesterol: 72 mg/dL (ref 0–99)
TRIGLYCERIDES: 105 mg/dL (ref <150)
VLDL: 21 mg/dL (ref 0–40)

## 2016-06-30 LAB — PHOSPHORUS: Phosphorus: 3.7 mg/dL (ref 2.5–4.6)

## 2016-06-30 LAB — C DIFFICILE QUICK SCREEN W PCR REFLEX
C DIFFICILE (CDIFF) INTERP: NOT DETECTED
C Diff antigen: NEGATIVE
C Diff toxin: NEGATIVE

## 2016-06-30 LAB — SEDIMENTATION RATE: SED RATE: 100 mm/h — AB (ref 0–16)

## 2016-06-30 MED ORDER — MAGNESIUM OXIDE 400 (241.3 MG) MG PO TABS
400.0000 mg | ORAL_TABLET | Freq: Every day | ORAL | Status: DC
  Administered 2016-06-30 – 2016-07-04 (×5): 400 mg via ORAL
  Filled 2016-06-30 (×5): qty 1

## 2016-06-30 MED ORDER — CYCLOBENZAPRINE HCL 10 MG PO TABS
5.0000 mg | ORAL_TABLET | Freq: Three times a day (TID) | ORAL | Status: DC | PRN
  Administered 2016-06-30: 5 mg via ORAL
  Filled 2016-06-30: qty 1

## 2016-06-30 NOTE — Progress Notes (Signed)
STROKE TEAM PROGRESS NOTE   HISTORY OF PRESENT ILLNESS (per record) Wesley Burns is an 50 y.o. male history of diabetes mellitus, hypertension, chronic kidney disease, anemia and osteomyelitis, brought to the emergency room after being found unable to speak and move his left side. Patient was last known well on 06/25/2016. His significant other spoke with him at 5:44 PM on 06/27/2016 and speech was slurred, but patient was coherent. He has no previous history of stroke nor TIA. Blood pressure was markedly elevated on presentation, requiring acute intervention with Cardene drip. CT scan of his head showed a fairly large right basal ganglia acute hematoma with mass effect, including a 4 mm right to left midline shift. There was no hemorrhage into the right lateral ventricle. NIH stroke score was 22.  LSN: 06/25/2016 tPA Given: No: Acute ICH mRankin:   SUBJECTIVE (INTERVAL HISTORY) His significant other is not at the bedside.  RN at the bedside.  No events overnight.  Full ROS performed.  Patient complained of:  Diarrhea without ability to sense when he needs to have a bowel movement, new onset leg spasms right>left; with resultant right leg pain  OBJECTIVE Temp:  [98.1 F (36.7 C)-99.1 F (37.3 C)] 98.1 F (36.7 C) (07/30 0800) Pulse Rate:  [79-102] 83 (07/30 0700) Cardiac Rhythm: Normal sinus rhythm (07/29 2000) Resp:  [13-26] 18 (07/30 0700) BP: (130-190)/(77-109) 142/93 (07/30 0700) SpO2:  [92 %-100 %] 93 % (07/30 0700)  CBC:   Recent Labs Lab 06/28/16 0240 06/30/16 0844  WBC 11.6* 7.6  NEUTROABS 10.0*  --   HGB 9.0* 7.5*  HCT 27.3* 22.4*  MCV 90.4 89.6  PLT 324 239    Basic Metabolic Panel:   Recent Labs Lab 06/28/16 0240 06/30/16 0324 06/30/16 0844  NA 129* 129*  --   K 3.9 3.5  --   CL 98* 105  --   CO2 17* 17*  --   GLUCOSE 150* 100*  --   BUN 26* 22*  --   CREATININE 3.78* 3.57*  --   CALCIUM 8.9 7.7*  --   MG  --   --  1.1*  PHOS  --   --  3.7     Lipid Panel:     Component Value Date/Time   CHOL 157 06/30/2016 0324   TRIG 105 06/30/2016 0324   HDL 64 06/30/2016 0324   CHOLHDL 2.5 06/30/2016 0324   VLDL 21 06/30/2016 0324   LDLCALC 72 06/30/2016 0324   HgbA1c:  Lab Results  Component Value Date   HGBA1C 4.7 (L) 04/21/2015   Urine Drug Screen:     Component Value Date/Time   LABOPIA NONE DETECTED 06/28/2016 0320   COCAINSCRNUR NONE DETECTED 06/28/2016 0320   LABBENZ NONE DETECTED 06/28/2016 0320   AMPHETMU NONE DETECTED 06/28/2016 0320   THCU POSITIVE (A) 06/28/2016 0320   LABBARB NONE DETECTED 06/28/2016 0320      IMAGING MRI Brain W/o Contrast 06/29/2016 1. Stable appearance of right basal ganglia hemorrhage measuring 4.6 x 2.2 x 2.6 cm with significant surrounding edema resulting and partial effacement of the right lateral ventricle but no significant midline shift. 2. Remote lacunar infarcts involving the posterior basal ganglia on the left also have foci of microhemorrhage. 3. Additional foci of susceptibility and remote hemorrhage in the brainstem.   Multi focal areas suggest underlying vasculitis. 4. Bilateral mastoid effusions. No obstructing nasopharyngeal lesion is present.  Ct Head Wo Contrast 06/29/2016 1. No significant interval change in right basal  ganglia hemorrhage with similar localized vasogenic edema and mild 3 mm of right-to-left shift.  2. No other new acute intracranial process.    Ct Head Wo Contrast 06/28/2016 Right basal ganglia hemorrhage is unchanged. Mild midline shift to the left unchanged. No significant change since earlier today.    Ct Head Wo Contrast 06/28/2016 Right basal ganglia acute hemorrhage with mild mass effect and 4 mm right-to-left midline shift. Close follow-up recommended.    Dg Chest Port 1 View Result Date: 06/28/2016 No active disease.    Carotid ultrasound 06/28/2016 There is no obvious evidence of hemodynamically significant internal carotid artery  stenosis bilaterally. Vertebral arteries are patent with antegrade flow.  PHYSICAL EXAM HEENT-  PERRL, sclera clear, Normocephalic, no lesions, without obvious abnormality.  Normal external eye and conjunctiva. Neck supple with no masses, nodes, nodules or enlargement. Cardiovascular - regular rate and rhythm and systolic murmur: early systolic 2/6, crescendo at 2nd right intercostal space Lungs - chest clear, no wheezing, rales, normal symmetric air entry Abdomen - soft, non-tender; bowel sounds normal; no masses,  no organomegaly Extremities - transmetatarsal amputation of right foot  Neurologic Examination: Patient alert, followed all commands.  Fluent.  Oriented x 3.  Pupils were equal and reacted normally to light. Extraocular movements were full and conjugate.  Mild left lower facial weakness was noted. Able to cough Shrug symmetric  There was minimal left upper extremity drift.    He has normal strength on the right UE however, he id less able to move right leg today due to pain in the leg. 4/5 on the left Sensory exam intact to LT bilaterally; he reports pain in right leg Cerebellar:  FN intact bilaterally relative to motor strength   ASSESSMENT/PLAN [name] is a 51 y.o. male with history of Type 2 diabetes mellitus, osteomyelitis of the right foot, neuropathy, hypertension, history of renal calculi, previous head injury, gout, elevated liver function tests, diabetic foot ulcers, depression, anxiety, and anemia presenting with aphasia and left hemiplegia. He did not receive IV t-PA due to hemorrhage.  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  Resultant  Left sided weakness  MRI - see above - right basal ganglia hemorrhage measuring 4.6 x 2.2 x 2.6 cm. ? Vasculitis.  MRA - not performed  Carotid Doppler - unremarkable as noted above  2D Echo - Pending  LDL - 72  HgbA1c pending  VTE prophylaxis  - SCDs Diet Carb Modified Fluid consistency: Thin; Room service appropriate? Yes  No antithrombotic prior to admission, now on No antithrombotic secondary to hemorrhage  Ongoing aggressive stroke risk factor management  Therapy recommendations:  Pending  Disposition: Pending  Hypertension  Stable  Long-term BP goal normotensive       On lasix, avapro and inderal.  Started in afternoon yesterday.  Will follow BPs to determine id home med regimen sufficient  Hyperlipidemia  Home meds: No lipid lowering medications prior to admission  LDL not performed, goal < 70  We will check lipid panel  Continue statin at discharge  Diabetes  HgbA1c pending, goal < 7.0   Other Stroke Risk Factors  Cigarette smoker advised to stop smoking  ETOH use, advised to drink no more than 1 drink(s) a day  Other Active Problems  UDS positive for THCU  Hyponatremia - 129 -> 129  Chronic kidney disease - BUN 26 ; creatinine 3.78 -> 22 / 3.57  Anemia - hemoglobin 9 ; hematocrit 27.3  now 7.5 / 22.4  Mild leukocytosis - wbc's 11.6; now improved to 7.6  Hypocalcemia - 7.7  Hospital day # 2  CRITICAL CARE NEUROLOGY ATTENDING NOTE Patient was seen and examined by me personally. I independently viewed imaging studies, participated in medical decision making and plan of care. The laboratory and radiographic studies were personally reviewed by me.  ROS completed by me personally and pertinent positives fully documented.  Assessment and plan completed by me personally and fully documented above.  PLAN FOR TODAY:  MRI reviewed.  ICH stable.  Marked cerebral edema.  Question raised for vasculitis.  Mother had very severe RF.  Patient unclear of other autoimmune history.  Will send vasculitis studies  Ordered prn Flexeril  Hyponatremia:  Etiology unclear; stable  Hypomagnesemia.  Ordered Magnesium  daily  Ordered TSH  Creatinine improved  Diarrhea  C. Dif ordered.  If  negative, can order Imodium  Anemia worse today:  Guaiac stools  Repeat H/H this afternoon  Ordered IRON and TIBC  Leukocytosis:  Repeat CBC with improved WBC.  UTI on admission.  Started Rocephin.  Pharmacy to consult for dosing.  MRI also shows mastoid effusions  Condition is improved   This patient is critically ill and at significant risk of neurological worsening, death and care requires constant monitoring of vital signs, hemodynamics,respiratory and cardiac monitoring, extensive review of multiple databases, frequent neurological assessment, discussion with family, other specialists and medical decision making of high complexity.  This critical care time does not reflect procedure time, or teaching time or supervisory time of PA/NP/Med Resident etc. but could involve care discussion time.  I spent 30 minutes of Neurocritical Care time in the care of  this patient.  SIGNED BY: Dr. Sula Soda      To contact Stroke Continuity provider, please refer to [email or web address]. After hours, contact General Neurology

## 2016-06-30 NOTE — Evaluation (Signed)
Physical Therapy Evaluation Patient Details Name: Wesley Burns MRN: [id number] DOB: [birth year] Today's Date: 06/30/2016   History of Present Illness   50 yo male admit with large right basal ganglia acute hematoma with mass effect.  Hx DM, HTN, CKD, anemia, osteomyelitis s/p right midfoot amputation.   Clinical Impression  Pt presents with moderate to severe functional limitations due to pain, decr motor control, decr cognition, decr strength, requiring moderate to maximal assistance to move to sit and stand.  Will benefit from inpatient postacute rehabilitation to address deficits and return home with partner.  PT will initiate rehab in acute setting and assist with d/c recommendations as pt progresses.  See below for details of exam findings.  Vitals:   06/30/16 1500 06/30/16 1530  BP:  (!) (P) 149/93  Pulse: 79 (P) 80  Resp: 18 (P) 15  Temp:          Follow Up Recommendations CIR    Equipment Recommendations  Rolling walker with 5" wheels (tbd)    Recommendations for Other Services Rehab consult     Precautions / Restrictions Precautions Precautions: Fall      Mobility  Bed Mobility Overal bed mobility: Needs Assistance Bed Mobility: Supine to Sit;Sit to Supine     Supine to sit: Max assist;HOB elevated Sit to supine: Mod assist   General bed mobility comments: limited due to right knee pain, but able to shift to EOB using draw pad to assist and props self with left UE   Transfers Overall transfer level: Needs assistance Equipment used: None Transfers: Sit to/from Stand Sit to Stand: Mod assist;From elevated surface         General transfer comment: initially more assist with lift off, but left LE extensors kicked in and compesated for limiting right knee pain.  able to stand with 40% assist x 1 min, attempted to shift wt to step to side but knee pain RIGHT prevents  Ambulation/Gait                Stairs            Wheelchair Mobility     Modified Rankin (Stroke Patients Only) Modified Rankin (Stroke Patients Only) Pre-Morbid Rankin Score: No significant disability Modified Rankin: Moderately severe disability     Balance Overall balance assessment: Needs assistance Sitting-balance support: Single extremity supported;Feet supported Sitting balance-Leahy Scale: Fair     Standing balance support: Bilateral upper extremity supported;During functional activity Standing balance-Leahy Scale: Poor Standing balance comment: limited by pain more than instability                             Pertinent Vitals/Pain Pain Assessment: 0-10 Pain Score: 8  Pain Location: right knee Pain Intervention(s): Limited activity within patient's tolerance;Monitored during session;Repositioned;Ice applied    Home Living Family/patient expects to be discharged to:: Private residence Living Arrangements: Spouse/significant other Available Help at Discharge: Family Type of Home: House Home Access: Stairs to enter Entrance Stairs-Rails: None Entrance Stairs-Number of Steps: 2 Home Layout: Multi-level Home Equipment: None Additional Comments: partner travels     Prior Function Level of Independence: Independent with assistive device(s);Independent               Hand Dominance   Dominant Hand: Right    Extremity/Trunk Assessment   Upper Extremity Assessment: Defer to OT evaluation           Lower Extremity Assessment: RLE deficits/detail;LLE deficits/detail RLE Deficits /  Details: previous midfoot amputation; knee painful with ROM, appears swollen with healing abrasions on lateral thigh.  Unclear if tramua or ?rheumatiod arthritis LLE Deficits / Details: weakness generally 3-/5 throughout, able to lift, shift, reposition with minimal clearance and min assistance  Cervical / Trunk Assessment: Normal  Communication   Communication: No difficulties  Cognition Arousal/Alertness: Awake/alert Behavior During  Therapy: WFL for tasks assessed/performed Overall Cognitive Status: Within Functional Limits for tasks assessed (seems slower to process/respond but is appropriate) Area of Impairment: Attention   Current Attention Level: Selective                General Comments      Exercises        Assessment/Plan    PT Assessment Patient needs continued PT services  PT Diagnosis Hemiplegia non-dominant side   PT Problem List Pain;Decreased mobility;Decreased balance;Decreased activity tolerance;Decreased range of motion;Decreased strength  PT Treatment Interventions Modalities;Patient/family education;Neuromuscular re-education;Therapeutic exercise;Therapeutic activities;Functional mobility training;Gait training;DME instruction   PT Goals (Current goals can be found in the Care Plan section) Acute Rehab PT Goals Patient Stated Goal: home PT Goal Formulation: With patient Time For Goal Achievement: 07/14/16 Potential to Achieve Goals: Good    Frequency Min 4X/week   Barriers to discharge Inaccessible home environment many steps but pt can live downstairs;  states partner can take time off to care for him    Co-evaluation               End of Session   Activity Tolerance: Patient tolerated treatment well Patient left: in bed;with call bell/phone within reach Nurse Communication: Mobility status (is knee pain traumatic or due to RA?)         Time: 4403-4742 PT Time Calculation (min) (ACUTE ONLY): 28 min   Charges:   PT Evaluation $PT Eval Moderate Complexity: 1 Procedure PT Treatments $Therapeutic Activity: 8-22 mins   PT G Codes:        Dennis Bast 06/30/2016, 3:26 PM

## 2016-06-30 NOTE — Progress Notes (Signed)
Inpatient Rehabilitation  Per PT request patient was screened by Zyiere Rosemond for appropriateness for an Inpatient Acute Rehab consult.  At this time we are recommending an Inpatient Rehab consult.  Please order if you are agreeable.    Tammie Yanda, M.A., CCC/SLP Admission Coordinator  [facility]  Cell [phone]  

## 2016-07-01 ENCOUNTER — Inpatient Hospital Stay (HOSPITAL_COMMUNITY): Payer: 59

## 2016-07-01 DIAGNOSIS — N189 Chronic kidney disease, unspecified: Secondary | ICD-10-CM

## 2016-07-01 DIAGNOSIS — F1210 Cannabis abuse, uncomplicated: Secondary | ICD-10-CM

## 2016-07-01 DIAGNOSIS — G936 Cerebral edema: Secondary | ICD-10-CM

## 2016-07-01 DIAGNOSIS — D62 Acute posthemorrhagic anemia: Secondary | ICD-10-CM

## 2016-07-01 DIAGNOSIS — D638 Anemia in other chronic diseases classified elsewhere: Secondary | ICD-10-CM

## 2016-07-01 DIAGNOSIS — D649 Anemia, unspecified: Secondary | ICD-10-CM

## 2016-07-01 DIAGNOSIS — Z899 Acquired absence of limb, unspecified: Secondary | ICD-10-CM

## 2016-07-01 DIAGNOSIS — R509 Fever, unspecified: Secondary | ICD-10-CM

## 2016-07-01 DIAGNOSIS — R258 Other abnormal involuntary movements: Secondary | ICD-10-CM

## 2016-07-01 DIAGNOSIS — E1142 Type 2 diabetes mellitus with diabetic polyneuropathy: Secondary | ICD-10-CM

## 2016-07-01 DIAGNOSIS — E11610 Type 2 diabetes mellitus with diabetic neuropathic arthropathy: Secondary | ICD-10-CM

## 2016-07-01 DIAGNOSIS — N179 Acute kidney failure, unspecified: Secondary | ICD-10-CM

## 2016-07-01 DIAGNOSIS — M25561 Pain in right knee: Secondary | ICD-10-CM

## 2016-07-01 DIAGNOSIS — I10 Essential (primary) hypertension: Secondary | ICD-10-CM

## 2016-07-01 DIAGNOSIS — G935 Compression of brain: Secondary | ICD-10-CM

## 2016-07-01 DIAGNOSIS — F4323 Adjustment disorder with mixed anxiety and depressed mood: Secondary | ICD-10-CM

## 2016-07-01 DIAGNOSIS — I619 Nontraumatic intracerebral hemorrhage, unspecified: Secondary | ICD-10-CM

## 2016-07-01 DIAGNOSIS — E871 Hypo-osmolality and hyponatremia: Secondary | ICD-10-CM

## 2016-07-01 DIAGNOSIS — R159 Full incontinence of feces: Secondary | ICD-10-CM

## 2016-07-01 DIAGNOSIS — M10061 Idiopathic gout, right knee: Secondary | ICD-10-CM

## 2016-07-01 DIAGNOSIS — R197 Diarrhea, unspecified: Secondary | ICD-10-CM

## 2016-07-01 DIAGNOSIS — I610 Nontraumatic intracerebral hemorrhage in hemisphere, subcortical: Principal | ICD-10-CM

## 2016-07-01 DIAGNOSIS — Z720 Tobacco use: Secondary | ICD-10-CM

## 2016-07-01 LAB — COMPREHENSIVE METABOLIC PANEL
ALBUMIN: 1.8 g/dL — AB (ref 3.5–5.0)
ALK PHOS: 136 U/L — AB (ref 38–126)
ALT: 15 U/L — AB (ref 17–63)
ANION GAP: 8 (ref 5–15)
AST: 36 U/L (ref 15–41)
BUN: 22 mg/dL — ABNORMAL HIGH (ref 6–20)
CALCIUM: 7.5 mg/dL — AB (ref 8.9–10.3)
CHLORIDE: 106 mmol/L (ref 101–111)
CO2: 16 mmol/L — AB (ref 22–32)
CREATININE: 3.84 mg/dL — AB (ref 0.61–1.24)
GFR calc non Af Amer: 17 mL/min — ABNORMAL LOW (ref >60)
GFR, EST AFRICAN AMERICAN: 19 mL/min — AB (ref >60)
GLUCOSE: 80 mg/dL (ref 65–99)
Potassium: 3.6 mmol/L (ref 3.5–5.1)
SODIUM: 130 mmol/L — AB (ref 135–145)
Total Bilirubin: 0.4 mg/dL (ref 0.3–1.2)
Total Protein: 4.8 g/dL — ABNORMAL LOW (ref 6.5–8.1)

## 2016-07-01 LAB — SYNOVIAL CELL COUNT + DIFF, W/ CRYSTALS
EOSINOPHILS-SYNOVIAL: 0 % (ref 0–1)
LYMPHOCYTES-SYNOVIAL FLD: 0 % (ref 0–20)
MONOCYTE-MACROPHAGE-SYNOVIAL FLUID: 5 % — AB (ref 50–90)
NEUTROPHIL, SYNOVIAL: 95 % — AB (ref 0–25)
Other Cells-SYN: 0
WBC, SYNOVIAL: 9875 /mm3 — AB (ref 0–200)

## 2016-07-01 LAB — TSH: TSH: 3.767 u[IU]/mL (ref 0.350–4.500)

## 2016-07-01 LAB — CBC
HCT: 21.3 % — ABNORMAL LOW (ref 39.0–52.0)
HEMOGLOBIN: 7 g/dL — AB (ref 13.0–17.0)
MCH: 30 pg (ref 26.0–34.0)
MCHC: 32.9 g/dL (ref 30.0–36.0)
MCV: 91.4 fL (ref 78.0–100.0)
Platelets: 218 10*3/uL (ref 150–400)
RBC: 2.33 MIL/uL — ABNORMAL LOW (ref 4.22–5.81)
RDW: 14.6 % (ref 11.5–15.5)
WBC: 9 10*3/uL (ref 4.0–10.5)

## 2016-07-01 LAB — C3 COMPLEMENT: C3 Complement: 101 mg/dL (ref 82–167)

## 2016-07-01 LAB — IRON AND TIBC
Iron: 7 ug/dL — ABNORMAL LOW (ref 45–182)
SATURATION RATIOS: 4 % — AB (ref 17.9–39.5)
TIBC: 179 ug/dL — AB (ref 250–450)
UIBC: 172 ug/dL

## 2016-07-01 LAB — HIV ANTIBODY (ROUTINE TESTING W REFLEX): HIV SCREEN 4TH GENERATION: NONREACTIVE

## 2016-07-01 LAB — RPR: RPR Ser Ql: NONREACTIVE

## 2016-07-01 LAB — GLUCOSE, CAPILLARY: GLUCOSE-CAPILLARY: 128 mg/dL — AB (ref 65–99)

## 2016-07-01 LAB — MAGNESIUM: Magnesium: 1.1 mg/dL — ABNORMAL LOW (ref 1.7–2.4)

## 2016-07-01 LAB — C4 COMPLEMENT: Complement C4, Body Fluid: 22 mg/dL (ref 14–44)

## 2016-07-01 MED ORDER — COLCHICINE 0.6 MG PO TABS
1.2000 mg | ORAL_TABLET | Freq: Once | ORAL | Status: AC
  Administered 2016-07-01: 1.2 mg via ORAL
  Filled 2016-07-01: qty 2

## 2016-07-01 MED ORDER — FERROUS SULFATE 325 (65 FE) MG PO TABS
325.0000 mg | ORAL_TABLET | Freq: Three times a day (TID) | ORAL | Status: DC
  Administered 2016-07-02 – 2016-07-04 (×8): 325 mg via ORAL
  Filled 2016-07-01 (×7): qty 1

## 2016-07-01 MED ORDER — COLCHICINE 0.6 MG PO TABS
0.3000 mg | ORAL_TABLET | Freq: Every day | ORAL | Status: DC
  Administered 2016-07-02: 0.3 mg via ORAL
  Filled 2016-07-01: qty 1

## 2016-07-01 MED ORDER — METHYLPREDNISOLONE ACETATE 80 MG/ML IJ SUSP
80.0000 mg | Freq: Once | INTRAMUSCULAR | Status: DC | PRN
  Filled 2016-07-01: qty 1

## 2016-07-01 MED ORDER — AMLODIPINE BESYLATE 5 MG PO TABS
5.0000 mg | ORAL_TABLET | Freq: Every day | ORAL | Status: DC
  Administered 2016-07-01 – 2016-07-02 (×2): 5 mg via ORAL
  Filled 2016-07-01 (×2): qty 1

## 2016-07-01 MED ORDER — HEPARIN SODIUM (PORCINE) 5000 UNIT/ML IJ SOLN
5000.0000 [IU] | Freq: Three times a day (TID) | INTRAMUSCULAR | Status: DC
  Administered 2016-07-01 – 2016-07-04 (×8): 5000 [IU] via SUBCUTANEOUS
  Filled 2016-07-01 (×8): qty 1

## 2016-07-01 MED ORDER — COLCHICINE 0.6 MG PO TABS: 0.6000 mg | ORAL_TABLET | Freq: Once | ORAL | Status: DC

## 2016-07-01 MED ORDER — LIDOCAINE HCL 1 % IJ SOLN
10.0000 mL | Freq: Once | INTRAMUSCULAR | Status: DC
  Filled 2016-07-01: qty 10

## 2016-07-01 MED ORDER — LIDOCAINE HCL 1 % IJ SOLN
10.0000 mL | Freq: Once | INTRAMUSCULAR | Status: DC | PRN
  Filled 2016-07-01: qty 10

## 2016-07-01 MED ORDER — LABETALOL HCL 5 MG/ML IV SOLN
10.0000 mg | INTRAVENOUS | Status: DC | PRN
  Administered 2016-07-02 (×3): 20 mg via INTRAVENOUS
  Filled 2016-07-01 (×3): qty 4

## 2016-07-01 MED ORDER — COLCHICINE 0.6 MG PO TABS: 0.6000 mg | ORAL_TABLET | Freq: Every day | ORAL | Status: DC

## 2016-07-01 NOTE — Progress Notes (Addendum)
Pt arrived to unit. Oriented to room. Call bell with in reach. Telemetry started.  Ice pack applied to right knee. No drainage noted from site.   Has some current right leg weakness from right knee pain and recent aspiration.  Stroke deficits are left sided.

## 2016-07-01 NOTE — Progress Notes (Addendum)
STROKE TEAM PROGRESS NOTE   SUBJECTIVE (INTERVAL HISTORY) Patient up in the chair, eating breakfast. Cardene stopped at 3a this morning pre RN. BP remains < 160. Pt complains of R knee pain, and limited ROM. Denies hx of gout. He had extreme pain on knee passive movement. Still has diarrhea, but C diff negative.   OBJECTIVE Temp:  [98.9 F (37.2 C)-100 F (37.8 C)] 99.1 F (37.3 C) (07/31 0800) Pulse Rate:  [72-89] 78 (07/31 0800) Cardiac Rhythm: Normal sinus rhythm (07/31 0800) Resp:  [13-29] 18 (07/31 0800) BP: (134-170)/(78-103) 170/97 (07/31 0930) SpO2:  [96 %-100 %] 98 % (07/31 0800)  CBC:   Recent Labs Lab 06/28/16 0240  06/30/16 1457 07/01/16 0536  WBC 11.6*  < > 10.8* 9.0  NEUTROABS 10.0*  --   --   --   HGB 9.0*  < > 7.4* 7.0*  HCT 27.3*  < > 21.7* 21.3*  MCV 90.4  < > 88.9 91.4  PLT 324  < > 261 218  < > = values in this interval not displayed.  Basic Metabolic Panel:   Recent Labs Lab 06/30/16 0324 06/30/16 0844 07/01/16 0536  NA 129*  --  130*  K 3.5  --  3.6  CL 105  --  106  CO2 17*  --  16*  GLUCOSE 100*  --  80  BUN 22*  --  22*  CREATININE 3.57*  --  3.84*  CALCIUM 7.7*  --  7.5*  MG  --  1.1* 1.1*  PHOS  --  3.7  --     Lipid Panel:     Component Value Date/Time   CHOL 157 06/30/2016 0324   TRIG 105 06/30/2016 0324   HDL 64 06/30/2016 0324   CHOLHDL 2.5 06/30/2016 0324   VLDL 21 06/30/2016 0324   LDLCALC 72 06/30/2016 0324   HgbA1c:  Lab Results  Component Value Date   HGBA1C 4.7 (L) 04/21/2015   Urine Drug Screen:     Component Value Date/Time   LABOPIA NONE DETECTED 06/28/2016 0320   COCAINSCRNUR NONE DETECTED 06/28/2016 0320   LABBENZ NONE DETECTED 06/28/2016 0320   AMPHETMU NONE DETECTED 06/28/2016 0320   THCU POSITIVE (A) 06/28/2016 0320   LABBARB NONE DETECTED 06/28/2016 0320      IMAGING I have personally reviewed the radiological images below and agree with the radiology interpretations.  MRI Brain W/o  Contrast 06/29/2016 1. Stable appearance of right basal ganglia hemorrhage measuring 4.6 x 2.2 x 2.6 cm with significant surrounding edema resulting and partial effacement of the right lateral ventricle but no significant midline shift. 2. Remote lacunar infarcts involving the posterior basal ganglia on the left also have foci of microhemorrhage. 3. Additional foci of susceptibility and remote hemorrhage in the brainstem. Multi focal areas suggest underlying vasculitis. 4. Bilateral mastoid effusions. No obstructing nasopharyngeal lesion is present.  Ct Head Wo Contrast 06/29/2016 1. No significant interval change in right basal ganglia hemorrhage with similar localized vasogenic edema and mild 3 mm of right-to-left shift.  2. No other new acute intracranial process.   06/28/2016 Right basal ganglia hemorrhage is unchanged. Mild midline shift to the left unchanged. No significant change since earlier today.   06/28/2016 Right basal ganglia acute hemorrhage with mild mass effect and 4 mm right-to-left midline shift. Close follow-up recommended.   Carotid ultrasound 06/28/2016 There is no obvious evidence of hemodynamically significant internal carotid artery stenosis bilaterally. Vertebral arteries are patent with antegrade flow.  2D echocardiogram -  Left ventricle: The cavity size was normal. There was moderate concentric hypertrophy. Systolic function was normal. The estimated ejection fraction was in the range of 60% to 65%. Wall motion was normal; there were no regional wall motion abnormalities. The study is not technically sufficient to allow evaluation of LV diastolic function. - Aortic valve: There was no regurgitation. - Mitral valve: Calcified annulus. Transvalvular velocity was within the normal range. There was no evidence for stenosis. There was trivial regurgitation. - Right ventricle: The cavity size was normal. Wall thickness was normal. Systolic function was normal. - Atrial  septum: No defect or patent foramen ovale was identified by color flow Doppler. - Tricuspid valve: There was mild regurgitation. - Pulmonary arteries: Systolic pressure was within the normal range. PA peak pressure: 36 mm Hg (S).   PHYSICAL EXAM  Temp:  [98.3 F (36.8 C)-100 F (37.8 C)] 98.3 F (36.8 C) (07/31 1829) Pulse Rate:  [70-89] 77 (07/31 1800) Resp:  [13-25] 15 (07/31 1800) BP: (136-170)/(78-107) 166/82 (07/31 1829) SpO2:  [96 %-100 %] 100 % (07/31 1800)  General - Well nourished, well developed, in no apparent distress.  Ophthalmologic - Fundi not visualized due to noncooperation.  Cardiovascular - Regular rate and rhythm.  Mental Status -  Level of arousal and orientation to time, place, and person were intact. Language including expression, naming, repetition, comprehension was assessed and found intact. Fund of Knowledge was assessed and was intact.  Cranial Nerves II - XII - II - Visual field intact OU. III, IV, VI - Extraocular movements intact. V - Facial sensation intact bilaterally. VII - left facial droop. VIII - Hearing & vestibular intact bilaterally. X - Palate elevates symmetrically. XI - Chin turning & shoulder shrug intact bilaterally. XII - Tongue protrusion slightly to the left  Motor Strength - The patient's strength was normal in all extremities except LUE 4/5 with dexterity difficult at left hand and LLE 5-/5 and pronator drift was present on the left. However, significant pain on ROM at right knee. S/p right anterior foot amputation. Bulk was normal and fasciculations were absent.   Motor Tone - Muscle tone was assessed at the neck and appendages and was normal.  Reflexes - The patient's reflexes were 1+ in all extremities and he had no pathological reflexes.  Sensory - Light touch, temperature/pinprick were assessed and were symmetrical.    Coordination - The patient had normal movements in the hands with no ataxia or dysmetria.  Tremor  was absent.  Gait and Station - not tested due to R knee pain.   ASSESSMENT/PLAN Mr. KAYSHAWN OZBURN is a 50 y.o. male with history of Type 2 diabetes mellitus, osteomyelitis of the right foot, neuropathy, hypertension, history of renal calculi, previous head injury, gout, elevated liver function tests, diabetic foot ulcers, depression, anxiety, and anemia presenting with aphasia and left hemiplegia. He did not receive IV t-PA due to hemorrhage.  Stroke:  Right basal ganglia ICH secondary to uncontrolled hypertension.  Resultant  Left sided weakness  MRI - see above - right basal ganglia hemorrhage, 12-15cc  Carotid Doppler - unremarkable as noted above  2D Echo EF 60-65%. No source of embolus   LDL - 72  HgbA1c pending  VTE prophylaxis - SCDs and heparin Diet Carb Modified Fluid consistency: Thin; Room service appropriate? Yes  No antithrombotic prior to admission, now on No antithrombotic secondary to hemorrhage.   Ongoing aggressive stroke risk factor management  Therapy recommendations:  CIR  Disposition: Pending  Hypertensive  Emergency  BP up to 182/118  Cardene stopped at 3am       Stable this am  On hone meds of lasix, avapro and inderal.  Started in afternoon yesterday.   Add amlodipine 10 mg daily. Dr. Roda Shutters aware of allergy. Pt with no problems on cardene.  Prn labetolol 10-40mg   BP goal < 160  Hyperlipidemia  Home meds: No lipid lowering medications prior to admission  LDL 72, goal < 70  Hold off statin due to acute ICH  Tobacco abuse  Current smoker  Smoking cessation counseling provided  Pt is willing to quit  Right knee pain/swelling - likely gout flare  Ortho consulted  Right knee aspiration - lab pending  Put on colchicine  Uric acid in am  Anemia  Hb 9.0->7.5->7.0  Iron 7, will check ferritin level  Fecal occult blood pending  Close monitoring  PRBC if needed  Other Stroke Risk Factors  ETOH use, advised to drink no  more than 2 drink(s) a day  UDS positive for THCU  Other Active Problems  Hyponatremia - 129 -> 130  Chronic kidney disease - BUN 26 ; creatinine 3.78 -> 22 / 3.84  Abnormal UA, started on rocephin. Final urine culture negative. Stop rocephin.   B hip replacements in the past  Diarrhea. Cdiff. Neg yesterday.   Hospital day # 3  This patient is critically ill due to right BG bleeding, gout flare, diarrhea and at significant risk of neurological worsening, death form recurrent bleeding, cerebral edema, gout flare, septic shock. This patient's care requires constant monitoring of vital signs, hemodynamics, respiratory and cardiac monitoring, review of multiple databases, neurological assessment, discussion with family, other specialists and medical decision making of high complexity. I spent 40 minutes of neurocritical care time in the care of this patient.   Marvel Plan, MD PhD Stroke Neurology 07/01/2016 7:48 PM   To contact Stroke Continuity provider, please refer to [email or web address]. After hours, contact General Neurology
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
## 2016-07-01 NOTE — Evaluation (Signed)
Occupational Therapy Evaluation Patient Details Name: Wesley Burns MRN: [id number] DOB: [birth year] Today's Date: 07/01/2016    History of Present Illness 50 yo male admit with large right basal ganglia acute hematoma with mass effect.  Hx DM, HTN, CKD, anemia, osteomyelitis s/p right midfoot amputation.   Clinical Impression   PT admitted with R basal ganglia hematoma. Pt currently with functional limitiations due to the deficits listed below (see OT problem list). Pt will benefit from skilled OT to increase their independence and safety with adls and balance to allow discharge CIR.     Follow Up Recommendations  CIR    Equipment Recommendations  Other (comment) (TBA)    Recommendations for Other Services Rehab consult     Precautions / Restrictions Precautions Precautions: Fall      Mobility Bed Mobility Overal bed mobility: +2 for physical assistance;Needs Assistance Bed Mobility: Supine to Sit;Rolling Rolling: Max assist   Supine to sit: +2 for physical assistance;Mod assist     General bed mobility comments: Pt requires (A) to advance bil LE off eob and (A) to maintain R LE alignment due to pain. pt c/o pain at hip and knee   Transfers Overall transfer level: Needs assistance   Transfers: Sit to/from Stand Sit to Stand: +2 physical assistance;Mod assist         General transfer comment: Pt with L lateral lean in static standing. pt needs (A) to shift weight and attempting to resist weight on R LE    Balance Overall balance assessment: Needs assistance Sitting-balance support: Bilateral upper extremity supported;Feet supported Sitting balance-Leahy Scale: Poor       Standing balance-Leahy Scale: Poor                              ADL Overall ADL's : Needs assistance/impaired Eating/Feeding: Set up;Sitting Eating/Feeding Details (indicate cue type and reason): pt able to self feed with utensils Grooming: Wash/dry hands;Minimal  assistance;Sitting Grooming Details (indicate cue type and reason): needed mod cues and (A) to initiate             Lower Body Dressing: Total assistance             Tub/Shower Transfer Details (indicate cue type and reason): total (A) for peri hygiene and incontinent of bowel on arrival    General ADL Comments: Pt supine and agreeable to OOB to chair for breakfast. pt requires (A) to exit bed due to R knee pain and peri care     Vision     Perception     Praxis      Pertinent Vitals/Pain Faces Pain Scale: Hurts a little bit Pain Location: knee Pain Intervention(s): Monitored during session     Hand Dominance Right   Extremity/Trunk Assessment Upper Extremity Assessment Upper Extremity Assessment: Generalized weakness   Lower Extremity Assessment Lower Extremity Assessment: Defer to PT evaluation   Cervical / Trunk Assessment Cervical / Trunk Assessment: Normal   Communication Communication Communication: No difficulties   Cognition Arousal/Alertness: Awake/alert Behavior During Therapy: Flat affect Overall Cognitive Status: Impaired/Different from baseline Area of Impairment: Attention;Safety/judgement;Awareness   Current Attention Level: Sustained Memory: Decreased short-term memory   Safety/Judgement: Decreased awareness of safety;Decreased awareness of deficits Awareness: Emergent   General Comments: pt reports that spouse is home at all times but reports to PT eval that spouse travels. Questions (A) level for d/c   General Comments       Exercises  Shoulder Instructions      Home Living Family/patient expects to be discharged to:: Private residence Living Arrangements: Spouse/significant other Available Help at Discharge: Family Type of Home: House Home Access: Stairs to enter Secretary/administrator of Steps: 2 Entrance Stairs-Rails: None Home Layout: Multi-level Alternate Level Stairs-Number of Steps: 16 Alternate Level  Stairs-Rails: Can reach both Bathroom Shower/Tub: Chief Strategy Officer: Standard     Home Equipment: Environmental consultant - 2 wheels;Cane - single point;Crutches   Additional Comments: partner travels       Prior Functioning/Environment Level of Independence: Independent             OT Diagnosis: Generalized weakness;Acute pain   OT Problem List: Decreased strength;Decreased activity tolerance;Impaired balance (sitting and/or standing);Decreased safety awareness;Decreased knowledge of use of DME or AE;Decreased knowledge of precautions;Pain;Decreased cognition   OT Treatment/Interventions: Self-care/ADL training;Therapeutic exercise;DME and/or AE instruction;Therapeutic activities;Cognitive remediation/compensation;Patient/family education;Balance training    OT Goals(Current goals can be found in the care plan section) Acute Rehab OT Goals Patient Stated Goal: none stated OT Goal Formulation: With patient Time For Goal Achievement: 07/15/16 Potential to Achieve Goals: Good ADL Goals Pt Will Perform Grooming: with supervision;sitting Pt Will Perform Upper Body Bathing: with supervision;sitting Pt Will Perform Lower Body Bathing: with min assist;sit to/from stand Pt Will Transfer to Toilet: with supervision;bedside commode Additional ADL Goal #1: pt will complete bed mobility min (A) as precursor to adls  OT Frequency: Min 2X/week   Barriers to D/C:            Co-evaluation              End of Session Equipment Utilized During Treatment: Gait belt Nurse Communication: Mobility status;Precautions  Activity Tolerance: Patient limited by pain Patient left: in chair;with call bell/phone within reach;with chair alarm set;with nursing/sitter in room   Time: 0812-0837 OT Time Calculation (min): 25 min Charges:  OT General Charges $OT Visit: 1 Procedure OT Evaluation $OT Eval Moderate Complexity: 1 Procedure OT Treatments $Self Care/Home Management : 8-22  mins G-Codes:    Wesley Burns July 05, 2016, 12:10 PM  Wesley Burns   OTR/L Pager: [phone] Office: [phone] .

## 2016-07-01 NOTE — Progress Notes (Signed)
Speech Language Pathology Treatment: Dysphagia  Patient Details Name: Wesley Burns MRN: [id number] DOB: [birth year] Today's Date: 07/01/2016 Time: 4656-8127 SLP Time Calculation (min) (ACUTE ONLY): 15 min  Assessment / Plan / Recommendation Clinical Impression  Dysphagia treatment provided for diet tolerance check. Pt without overt s/s of aspiration with trials of thin liquids/ regular solid consistencies. No difficulties noted with straws today, no anterior spillage on L side. Pt continues to have L facial droop at rest but increased strength today, smile nearly symmetrical. Recommend continuing regular diet/ thin liquids, meds whole with liquid. Pt reported no difficulties with speech or word finding but reported feeling a bit "foggy" on occasion when asked about cognitive status. Pt sometimes slow to respond in conversation, appears to need extra processing time on occasion. SLP will sign off on swallow orders but may want to consider ordering a cognitive-linguistic eval.    HPI HPI: Pt is a 50 y.o. male with PMH of diabetes mellitus, hypertension, chronic kidney disease, anemia and osteomyelitis, brought to the emergency room 7/28 after being found unable to speak and move his left side. Head CT showed R basal ganglia acute hemorrhage with mild mass effect. CXR clear. Pt failed RN stroke swallow screen- not awake/ alert/ responding to speech- bedside swallow eval ordered.      SLP Plan  Continue with current plan of care     Recommendations  Diet recommendations: Regular;Thin liquid Liquids provided via: Cup;Straw Medication Administration: Whole meds with liquid Supervision: Patient able to self feed;Intermittent supervision to cue for compensatory strategies Compensations: Slow rate;Small sips/bites Postural Changes and/or Swallow Maneuvers: Seated upright 90 degrees;Upright 30-60 min after meal             Oral Care Recommendations: Oral care BID Follow up Recommendations: Other  (comment) (TBD) Plan: Continue with current plan of care     GO                Metro Kung, MA, CCC-SLP 07/01/2016, 11:33 AM [phone]

## 2016-07-01 NOTE — Progress Notes (Signed)
Patients was tested for C-diff test was negative.
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
## 2016-07-02 LAB — GLUCOSE, CAPILLARY
GLUCOSE-CAPILLARY: 175 mg/dL — AB (ref 65–99)
GLUCOSE-CAPILLARY: 188 mg/dL — AB (ref 65–99)
GLUCOSE-CAPILLARY: 105 mg/dL — AB (ref 65–99)
Glucose-Capillary: 220 mg/dL — ABNORMAL HIGH (ref 65–99)

## 2016-07-02 LAB — ANTINUCLEAR ANTIBODIES, IFA: ANA Ab, IFA: NEGATIVE

## 2016-07-02 LAB — COMPREHENSIVE METABOLIC PANEL
ALK PHOS: 140 U/L — AB (ref 38–126)
ALT: 16 U/L — AB (ref 17–63)
ANION GAP: 7 (ref 5–15)
AST: 31 U/L (ref 15–41)
Albumin: 1.7 g/dL — ABNORMAL LOW (ref 3.5–5.0)
BILIRUBIN TOTAL: 0.1 mg/dL — AB (ref 0.3–1.2)
BUN: 27 mg/dL — ABNORMAL HIGH (ref 6–20)
CHLORIDE: 106 mmol/L (ref 101–111)
CO2: 17 mmol/L — AB (ref 22–32)
Calcium: 7.6 mg/dL — ABNORMAL LOW (ref 8.9–10.3)
Creatinine, Ser: 4.17 mg/dL — ABNORMAL HIGH (ref 0.61–1.24)
GFR, EST AFRICAN AMERICAN: 17 mL/min — AB (ref >60)
GFR, EST NON AFRICAN AMERICAN: 15 mL/min — AB (ref >60)
Glucose, Bld: 170 mg/dL — ABNORMAL HIGH (ref 65–99)
POTASSIUM: 4.1 mmol/L (ref 3.5–5.1)
Sodium: 130 mmol/L — ABNORMAL LOW (ref 135–145)
Total Protein: 5 g/dL — ABNORMAL LOW (ref 6.5–8.1)

## 2016-07-02 LAB — GRAM STAIN

## 2016-07-02 LAB — CBC
HEMATOCRIT: 24.4 % — AB (ref 39.0–52.0)
HEMOGLOBIN: 8 g/dL — AB (ref 13.0–17.0)
MCH: 29.5 pg (ref 26.0–34.0)
MCHC: 32.8 g/dL (ref 30.0–36.0)
MCV: 90 fL (ref 78.0–100.0)
Platelets: 255 10*3/uL (ref 150–400)
RBC: 2.71 MIL/uL — AB (ref 4.22–5.81)
RDW: 14.5 % (ref 11.5–15.5)
WBC: 8.9 10*3/uL (ref 4.0–10.5)

## 2016-07-02 LAB — COMPLEMENT, TOTAL: COMPL TOTAL (CH50): 57 U/mL (ref 42–60)

## 2016-07-02 LAB — URIC ACID: Uric Acid, Serum: 8.2 mg/dL — ABNORMAL HIGH (ref 4.4–7.6)

## 2016-07-02 LAB — FERRITIN: Ferritin: 194 ng/mL (ref 24–336)

## 2016-07-02 LAB — HEMOGLOBIN A1C
Hgb A1c MFr Bld: 4.5 % — ABNORMAL LOW (ref 4.8–5.6)
Mean Plasma Glucose: 82 mg/dL

## 2016-07-02 MED ORDER — INSULIN ASPART 100 UNIT/ML ~~LOC~~ SOLN
0.0000 [IU] | Freq: Three times a day (TID) | SUBCUTANEOUS | Status: DC
  Administered 2016-07-02: 3 [IU] via SUBCUTANEOUS
  Administered 2016-07-02: 2 [IU] via SUBCUTANEOUS
  Administered 2016-07-03: 1 [IU] via SUBCUTANEOUS
  Administered 2016-07-03 – 2016-07-04 (×3): 2 [IU] via SUBCUTANEOUS

## 2016-07-02 MED ORDER — LORAZEPAM 2 MG/ML IJ SOLN
0.5000 mg | Freq: Once | INTRAMUSCULAR | Status: AC
Start: 2016-07-02 — End: 2016-07-02
  Administered 2016-07-02: 0.5 mg via INTRAVENOUS
  Filled 2016-07-02: qty 1

## 2016-07-02 MED ORDER — ONDANSETRON HCL 4 MG/2ML IJ SOLN
4.0000 mg | INTRAMUSCULAR | Status: DC | PRN
  Administered 2016-07-02 – 2016-07-04 (×2): 4 mg via INTRAVENOUS
  Filled 2016-07-02 (×2): qty 2

## 2016-07-02 MED ORDER — AMLODIPINE BESYLATE 10 MG PO TABS
10.0000 mg | ORAL_TABLET | Freq: Every day | ORAL | Status: DC
  Administered 2016-07-03 – 2016-07-04 (×2): 10 mg via ORAL
  Filled 2016-07-02 (×2): qty 1

## 2016-07-02 MED ORDER — LABETALOL HCL 5 MG/ML IV SOLN
10.0000 mg | INTRAVENOUS | Status: DC | PRN
  Administered 2016-07-02 – 2016-07-03 (×2): 20 mg via INTRAVENOUS
  Filled 2016-07-02 (×2): qty 4

## 2016-07-02 MED ORDER — ONDANSETRON HCL 4 MG/2ML IJ SOLN: 4.0000 mg | Freq: Four times a day (QID) | INTRAMUSCULAR | Status: DC | PRN

## 2016-07-02 MED ORDER — LOPERAMIDE HCL 2 MG PO CAPS
2.0000 mg | ORAL_CAPSULE | Freq: Once | ORAL | Status: AC
  Administered 2016-07-02: 2 mg via ORAL
  Filled 2016-07-02: qty 1

## 2016-07-02 MED ORDER — FUROSEMIDE 20 MG PO TABS
20.0000 mg | ORAL_TABLET | Freq: Every day | ORAL | Status: DC
  Administered 2016-07-03 – 2016-07-04 (×2): 20 mg via ORAL
  Filled 2016-07-02 (×2): qty 1

## 2016-07-02 NOTE — Progress Notes (Signed)
STROKE TEAM PROGRESS NOTE   SUBJECTIVE (INTERVAL HISTORY) Patient with hx of gout years ago. Does not take mediation PTA. Now on colchicine. R knee tapped by ortho yesterday. Pt feels better. He continues to have diarrhea. Cre elevated than yesterday. Encourage more po intake. Hb stable.    OBJECTIVE Temp:  [97.9 F (36.6 C)-99.1 F (37.3 C)] 98.3 F (36.8 C) (08/01 0954) Pulse Rate:  [70-82] 72 (08/01 0954) Cardiac Rhythm: Normal sinus rhythm (08/01 0700) Resp:  [13-25] 18 (08/01 0954) BP: (145-172)/(82-107) 172/86 (08/01 0954) SpO2:  [99 %-100 %] 100 % (08/01 0954)  CBC:   Recent Labs Lab 06/28/16 0240  07/01/16 0536 07/02/16 0544  WBC 11.6*  < > 9.0 8.9  NEUTROABS 10.0*  --   --   --   HGB 9.0*  < > 7.0* 8.0*  HCT 27.3*  < > 21.3* 24.4*  MCV 90.4  < > 91.4 90.0  PLT 324  < > 218 255  < > = values in this interval not displayed.  Basic Metabolic Panel:   Recent Labs Lab 06/30/16 0844 07/01/16 0536 07/02/16 0544  NA  --  130* 130*  K  --  3.6 4.1  CL  --  106 106  CO2  --  16* 17*  GLUCOSE  --  80 170*  BUN  --  22* 27*  CREATININE  --  3.84* 4.17*  CALCIUM  --  7.5* 7.6*  MG 1.1* 1.1*  --   PHOS 3.7  --   --     Lipid Panel:     Component Value Date/Time   CHOL 157 06/30/2016 0324   TRIG 105 06/30/2016 0324   HDL 64 06/30/2016 0324   CHOLHDL 2.5 06/30/2016 0324   VLDL 21 06/30/2016 0324   LDLCALC 72 06/30/2016 0324   HgbA1c:  Lab Results  Component Value Date   HGBA1C 4.5 (L) 06/30/2016   Urine Drug Screen:     Component Value Date/Time   LABOPIA NONE DETECTED 06/28/2016 0320   COCAINSCRNUR NONE DETECTED 06/28/2016 0320   LABBENZ NONE DETECTED 06/28/2016 0320   AMPHETMU NONE DETECTED 06/28/2016 0320   THCU POSITIVE (A) 06/28/2016 0320   LABBARB NONE DETECTED 06/28/2016 0320      IMAGING Dr. Roda Shutters has personally reviewed the radiological images below and agree with the radiology interpretations.  MRI Brain W/o Contrast 06/29/2016 1.  Stable appearance of right basal ganglia hemorrhage measuring 4.6 x 2.2 x 2.6 cm with significant surrounding edema resulting and partial effacement of the right lateral ventricle but no significant midline shift. 2. Remote lacunar infarcts involving the posterior basal ganglia on the left also have foci of microhemorrhage. 3. Additional foci of susceptibility and remote hemorrhage in the brainstem. Multi focal areas suggest underlying vasculitis. 4. Bilateral mastoid effusions. No obstructing nasopharyngeal lesion is present.  Ct Head Wo Contrast 06/29/2016 1. No significant interval change in right basal ganglia hemorrhage with similar localized vasogenic edema and mild 3 mm of right-to-left shift.  2. No other new acute intracranial process.   06/28/2016 Right basal ganglia hemorrhage is unchanged. Mild midline shift to the left unchanged. No significant change since earlier today.   06/28/2016 Right basal ganglia acute hemorrhage with mild mass effect and 4 mm right-to-left midline shift. Close follow-up recommended.   Carotid ultrasound 06/28/2016 There is no obvious evidence of hemodynamically significant internal carotid artery stenosis bilaterally. Vertebral arteries are patent with antegrade flow.  2D echocardiogram - Left ventricle: The cavity size  was normal. There was moderate concentric hypertrophy. Systolic function was normal. The estimated ejection fraction was in the range of 60% to 65%. Wall motion was normal; there were no regional wall motion abnormalities. The study is not technically sufficient to allow evaluation of LV diastolic function. - Aortic valve: There was no regurgitation. - Mitral valve: Calcified annulus. Transvalvular velocity was within the normal range. There was no evidence for stenosis. There was trivial regurgitation. - Right ventricle: The cavity size was normal. Wall thickness was normal. Systolic function was normal. - Atrial septum: No defect or  patent foramen ovale was identified by color flow Doppler. - Tricuspid valve: There was mild regurgitation. - Pulmonary arteries: Systolic pressure was within the normal range. PA peak pressure: 36 mm Hg (S).   PHYSICAL EXAM  Temp:  [97.9 F (36.6 C)-99.1 F (37.3 C)] 98.3 F (36.8 C) (08/01 0954) Pulse Rate:  [70-82] 72 (08/01 0954) Resp:  [13-25] 18 (08/01 0954) BP: (145-172)/(82-107) 172/86 (08/01 0954) SpO2:  [99 %-100 %] 100 % (08/01 0954)  General - Well nourished, well developed, in no apparent distress.  Ophthalmologic - Fundi not visualized due to noncooperation.  Cardiovascular - Regular rate and rhythm.  Mental Status -  Level of arousal and orientation to time, place, and person were intact. Language including expression, naming, repetition, comprehension was assessed and found intact. Fund of Knowledge was assessed and was intact.  Cranial Nerves II - XII - II - Visual field intact OU. III, IV, VI - Extraocular movements intact. V - Facial sensation intact bilaterally. VII - left facial droop. VIII - Hearing & vestibular intact bilaterally. X - Palate elevates symmetrically. XI - Chin turning & shoulder shrug intact bilaterally. XII - Tongue protrusion slightly to the left  Motor Strength - The patient's strength was normal in all extremities except LUE 4/5 with dexterity difficult at left hand and LLE 5-/5 and pronator drift was present on the left. However, significant pain on ROM at right knee. S/p right anterior foot amputation. Bulk was normal and fasciculations were absent.   Motor Tone - Muscle tone was assessed at the neck and appendages and was normal.  Reflexes - The patient's reflexes were 1+ in all extremities and he had no pathological reflexes.  Sensory - Light touch, temperature/pinprick were assessed and were symmetrical.    Coordination - The patient had normal movements in the hands with no ataxia or dysmetria.  Tremor was absent.  Gait  and Station - not tested due to R knee pain.   ASSESSMENT/PLAN Mr. KITT LEDET is a 50 y.o. male with history of Type 2 diabetes mellitus, osteomyelitis of the right foot, neuropathy, hypertension, history of renal calculi, previous head injury, gout, elevated liver function tests, diabetic foot ulcers, depression, anxiety, and anemia presenting with aphasia and left hemiplegia. He did not receive IV t-PA due to hemorrhage.  Stroke:  Right basal ganglia ICH secondary to uncontrolled hypertension.  Resultant  Left sided weakness  MRI - see above - right basal ganglia hemorrhage, 12-15cc  Carotid Doppler - unremarkable as noted above  2D Echo EF 60-65%. No source of embolus   LDL - 72  HgbA1c 4.5  VTE prophylaxis - SCDs and heparin Diet Carb Modified Fluid consistency: Thin; Room service appropriate? Yes  No antithrombotic prior to admission, now on No antithrombotic secondary to hemorrhage.   Ongoing aggressive stroke risk factor management  Therapy recommendations:  CIR. They agree if caregiver support available. Coordinator following  Disposition:  Pending  Hypertensive Emergency  BP up to 182/118  Cardene stopped at 3am       Stable this am  On hone meds of lasix, avapro and inderal.    Added amlodipine 5 mg daily yesterday. Increased to 10 today. Dr. Roda Shutters aware of allergy. Pt with no problems on cardene Decrease lasix to 20 mg po daily.  Prn labetolol 10-40mg  BP 172 and 170 over night. RN this am gave BP meds with plans to recheck. Discussed with RN.  BP goal remains < 160  Hyperlipidemia  Home meds: No lipid lowering medications prior to admission  LDL 72, goal < 70  Hold off statin due to acute ICH  Diabetes type II  Diet controlled  Monitor ac & hs  given corticosteroids yesterday  170s last night  Sensitive SSI added  Tobacco abuse  Current smoker  Smoking cessation counseling provided  Pt is willing to quit  Gout   R knee  pain/swelling  Ortho consulted  Right knee aspiration - positive SBC, no organisms  Put on colchicine  Uric acid elevated 8.2  Anemia  Hb 9.0->7.5->7.0->8.0  Iron 7,  ferritin level 194  Fecal occult blood pending  Close monitoring  PRBC if needed  Other Stroke Risk Factors  ETOH use, advised to drink no more than 2 drink(s) a day  UDS positive for THCU  Other Active Problems  Hyponatremia - 129 -> 130  Chronic kidney disease - BUN 26 ; creatinine 3.78 -> 27 / 4.17  Abnormal UA, started on rocephin. Final urine culture negative. Stopped rocephin.   B hip replacements in the past ([place])  Diarrhea. Cdiff. Neg. Diarrhea continues per pt but RN does not report. Isolation discontinued  Hospital day # 4  Marvel Plan, MD PhD Stroke Neurology 07/02/2016 1:19 PM    To contact Stroke Continuity provider, please refer to [email or web address]. After hours, contact General Neurology

## 2016-07-02 NOTE — Progress Notes (Signed)
Continuing to give labetalol for systolic BP >160 per atendings direction. BP is still > 160 at this time.

## 2016-07-02 NOTE — Progress Notes (Signed)
Patient with nausea and diarrhea. Also complaining of severe anxiety. His BP has become elevated. I think that anxiety may be playing some role and have therefore ordered low dose of ativan 0.5mg . I would favor continuing labetalol to get control of BP rather than starting cardene drip now that he is 4 days out from his hemorrhage. Also ordered loperamide and zofran.   Ritta Slot, MD [facility] [phone]  If 7pm- 7am, please page neurology on call as listed in AMION.

## 2016-07-02 NOTE — Progress Notes (Signed)
rn and tech assisted pt back to bed from chair. Pt able to stand and pivot, though he kept saying he was tired. Pt is able to readjust himself in bed.

## 2016-07-02 NOTE — Progress Notes (Signed)
BP elevated about goal, pt given PO HTN meds, will recheck BP, if BP still above goal, will give prn labetalol BP medication.

## 2016-07-02 NOTE — Progress Notes (Signed)
Pt is incontinent of urine and stool. Pt does not tell staff when he has had bowel movement on himself. Unsure if pt is even aware of when he has bowel movements. rn noted an odor and checked and saw stool.

## 2016-07-02 NOTE — Progress Notes (Signed)
Pt BP was over 160 systolic gave 20 mg of labetalol rechecked still over 160 gave another 20 mg of labetalol will recheck in 20 minutes also notifying on attending.

## 2016-07-02 NOTE — Progress Notes (Signed)
rn checking on pt, pt stated that he thought he needed to have a bowel movement and was not sure what he should do. rn stated pt already had a bowel movement. Pt stated "that one was an accident". rn left room and went and got bed pan and came directly back to the room. By the time rn returned pt stated he thought he had already had a bowel movement. Small amount of stool noted. rn cleaned pt up and then put him on the bedpan.

## 2016-07-02 NOTE — Care Management Note (Signed)
Case Management Note  Patient Details  Name: Wesley Burns MRN: [id number] Date of Birth: [birth year]  Subjective/Objective:                    Action/Plan: Plan is for CIR at discharge when patient is medically ready. CM following for discharge needs.   Expected Discharge Date:                  Expected Discharge Plan:  IP Rehab Facility  In-House Referral:  Clinical Social Work  Discharge planning Services  CM Consult  Post Acute Care Choice:    Choice offered to:     DME Arranged:    DME Agency:     HH Arranged:    HH Agency:     Status of Service:  In process, will continue to follow  If discussed at Long Length of Stay Meetings, dates discussed:    Additional Comments:  Kermit Balo, RN 07/02/2016, 2:01 PM

## 2016-07-02 NOTE — Progress Notes (Signed)
Speech Language Pathology Treatment: Cognitive-Linquistic  Patient Details Name: Wesley Burns MRN: [id number] DOB: Jan 06, 1966 Today's Date: 07/02/2016 Time: 1031-5945 SLP Time Calculation (min) (ACUTE ONLY): 15 min  Assessment / Plan / Recommendation Clinical Impression  Pt seen for f/u cognitive treatment. He was able to recall few tasks from SLP assessment on previous date with Min verbal prompts. Pt completed menu-reading task with Min-Mod cues provided for working memory and basic problem solving. Adequate sustained attention noted within a quiet environment. Pt did need Max cues for intellectual awareness of cognitive deficits. Continue to recommend CIR.   HPI HPI: Pt is a 50 y.o. male with PMH of diabetes mellitus, hypertension, chronic kidney disease, anemia and osteomyelitis, brought to the emergency room 7/28 after being found unable to speak and move his left side. MRI 7/29 showed stable R basal ganglia hemorrhage measuring 4.6 x 2.2 x 2.6 cm with significant surrounding edema resulting and partial effacement of the right lateral ventricle but no significant midline shift. Pt seen for diet check this a.m and reported feeling "a little foggy"; was somewhat slow to respond in conversation appearing to need extra processing time. RN reported not having concerns with speech/ language but that pt seemed "a little off". PT reported that pt seemed to have decreased selective attention this a.m during eval. Cognitive-linguistic eval ordered to further assess.      SLP Plan  Continue with current plan of care     Recommendations                Follow up Recommendations: Inpatient Rehab Plan: Continue with current plan of care     GO                Maxcine Ham 07/02/2016, 2:26 PM  Maxcine Ham, M.A. CCC-SLP [phone]

## 2016-07-02 NOTE — Progress Notes (Signed)
I met with pt at bedside to discuss his needs for physical rehab. He asks me to contact [name], his friend and his Chinita Pester who assists him in making decisions. I contacted Phil and Chinita Pester by phone and discussed the need for pt to receive inpt rehab prior to d/c home. They wished to discuss more pt's need for alcohol rehab. I explained that pt first needed his physical rehab and he could then be refered to outpt alcohol rehab in the future pending his agreement. I have arranged a meeting tomorrow at 11 am between pt, Eugenie Norrie and myself to discuss rehab venue options and dispo. I will begin insurance authorization for a possible inpt rehab admission when pt felt medically ready if pt and family agree. [phone]

## 2016-07-02 NOTE — Progress Notes (Signed)
Physical Therapy Treatment Patient Details Name: Wesley Burns MRN: [id number] DOB: 03-27-1966 Today's Date: 07/02/2016    History of Present Illness 50 yo male admit with large right basal ganglia acute hematoma with mass effect.  Hx DM, HTN, CKD, anemia, osteomyelitis s/p right midfoot amputation.    PT Comments    Pt initially resisting PT due to fatigue however pt agreeable with max encouragement. Pt demo'd significant improvement this date and was able to amb 73' with RW with minAx2. Pt encouraged to con't HEP in bed and chair. Con't to recommend CIR upon d/c to maximize functional recovery.  Follow Up Recommendations  CIR     Equipment Recommendations  Rolling walker with 5" wheels    Recommendations for Other Services Rehab consult     Precautions / Restrictions Precautions Precautions: Fall Restrictions Weight Bearing Restrictions: No    Mobility  Bed Mobility Overal bed mobility: Needs Assistance Bed Mobility: Rolling;Sidelying to Sit Rolling: Min assist Sidelying to sit: Min assist       General bed mobility comments: max directional v/c's minA for trunk elevation to stabilize at EOB  Transfers Overall transfer level: Needs assistance Equipment used: Rolling walker (2 wheeled) (no device for stand pivot, RW for sit<>stand) Transfers: Sit to/from [employer] Sit to Stand: +2 physical assistance;Mod assist Stand pivot transfers: +2 physical assistance;Mod assist       General transfer comment: Pt required L foot block during power up. Cues for LE movement for stand pivot transfer. Cues for hand placement for push off chair to RW and from RW back to chair sit<>stand.  Ambulation/Gait Ambulation/Gait assistance: +2 physical assistance Ambulation Distance (Feet): 75 Feet Assistive device: Rolling walker (2 wheeled) Gait Pattern/deviations: Decreased stride length;Step-through pattern;Narrow base of support;Trunk flexed Gait velocity:  below Gait velocity interpretation: Below normal speed for age/gender General Gait Details: pt able to use L hand to grip walker safely, v/c's to look up and maintain errect posture, max encouragement   Stairs            Wheelchair Mobility    Modified Rankin (Stroke Patients Only) Modified Rankin (Stroke Patients Only) Pre-Morbid Rankin Score: Moderate disability Modified Rankin: Moderately severe disability     Balance Overall balance assessment: Needs assistance Sitting-balance support: Bilateral upper extremity supported Sitting balance-Leahy Scale: Poor     Standing balance support: Bilateral upper extremity supported;During functional activity Standing balance-Leahy Scale: Poor                      Cognition Arousal/Alertness: Awake/alert Behavior During Therapy: Flat affect Overall Cognitive Status: No family/caregiver present to determine baseline cognitive functioning Area of Impairment: Attention;Safety/judgement   Current Attention Level: Selective Memory: Decreased short-term memory   Safety/Judgement: Decreased awareness of safety Awareness: Emergent        Exercises General Exercises - Lower Extremity Long Arc Quad: AROM;Both;10 reps;Seated Hip Flexion/Marching: AROM;10 reps;Standing;Both    General Comments        Pertinent Vitals/Pain Pain Assessment: 0-10 Pain Score: 5  Pain Location: right knee Pain Descriptors / Indicators: Discomfort ("feels like gout") Pain Intervention(s): Monitored during session    Home Living                      Prior Function            PT Goals (current goals can now be found in the care plan section) Acute Rehab PT Goals Patient Stated Goal: none stated PT Goal  Formulation: With patient Potential to Achieve Goals: Good Progress towards PT goals: Progressing toward goals    Frequency  Min 4X/week    PT Plan Current plan remains appropriate    Co-evaluation              End of Session Equipment Utilized During Treatment: Gait belt Activity Tolerance: Patient tolerated treatment well Patient left: with call bell/phone within reach;with chair alarm set;in chair     Time: 1440-1506 PT Time Calculation (min) (ACUTE ONLY): 26 min  Charges:  $Gait Training: 8-22 mins $Therapeutic Exercise: 8-22 mins                    G Codes:      Marcene Brawn 07/02/2016, 4:54 PM   [name] Shock, PT, DPT Pager #: [phone] Office #: [phone]

## 2016-07-02 NOTE — Progress Notes (Signed)
Wesley Burns  MRN: [id number] DOB/Age: [birth year] 50 y.o. Physician: Lynnea Maizes, M.D.     Subjective: Reports R knee pain improved Vital Signs Temp:  [97.9 F (36.6 C)-99.1 F (37.3 C)] 98.3 F (36.8 C) (08/01 0954) Pulse Rate:  [70-82] 81 (08/01 1043) Resp:  [14-23] 18 (08/01 0954) BP: (145-172)/(82-99) 149/83 (08/01 1115) SpO2:  [99 %-100 %] 99 % (08/01 1043)  Lab Results  Recent Labs  07/01/16 0536 07/02/16 0544  WBC 9.0 8.9  HGB 7.0* 8.0*  HCT 21.3* 24.4*  PLT 218 255   BMET  Recent Labs  07/01/16 0536 07/02/16 0544  NA 130* 130*  K 3.6 4.1  CL 106 106  CO2 16* 17*  GLUCOSE 80 170*  BUN 22* 27*  CREATININE 3.84* 4.17*  CALCIUM 7.5* 7.6*   INR  Date Value Ref Range Status  04/28/2015 1.00 0.00 - 1.49 Final     Exam  Right knee with minmal effusion, no erythema, minimal tenderness  Synovial fluid consistent acute gouty flair.  Plan Intra-articular corticosteroids given yesterday. Continue with oral meds for management of acute gouty flair. Ad lib activity regarding right knee. Call PRN Shweta Aman M Chandani Rogowski 07/02/2016, 1:30 PM    Contact # [phone]

## 2016-07-02 NOTE — Progress Notes (Signed)
pts condom catheter came off. Reapplied condom catheter. Will change underpad after pt is finished eating per pt request.

## 2016-07-03 DIAGNOSIS — I161 Hypertensive emergency: Secondary | ICD-10-CM

## 2016-07-03 LAB — COMPREHENSIVE METABOLIC PANEL
ALT: 103 U/L — AB (ref 17–63)
ANION GAP: 9 (ref 5–15)
AST: 285 U/L — ABNORMAL HIGH (ref 15–41)
Albumin: 1.6 g/dL — ABNORMAL LOW (ref 3.5–5.0)
Alkaline Phosphatase: 238 U/L — ABNORMAL HIGH (ref 38–126)
BUN: 31 mg/dL — ABNORMAL HIGH (ref 6–20)
CALCIUM: 7.4 mg/dL — AB (ref 8.9–10.3)
CHLORIDE: 108 mmol/L (ref 101–111)
CO2: 15 mmol/L — ABNORMAL LOW (ref 22–32)
CREATININE: 4.23 mg/dL — AB (ref 0.61–1.24)
GFR, EST AFRICAN AMERICAN: 17 mL/min — AB (ref >60)
GFR, EST NON AFRICAN AMERICAN: 15 mL/min — AB (ref >60)
Glucose, Bld: 137 mg/dL — ABNORMAL HIGH (ref 65–99)
Potassium: 4.2 mmol/L (ref 3.5–5.1)
SODIUM: 132 mmol/L — AB (ref 135–145)
Total Bilirubin: 1.1 mg/dL (ref 0.3–1.2)
Total Protein: 4.6 g/dL — ABNORMAL LOW (ref 6.5–8.1)

## 2016-07-03 LAB — GLUCOSE, CAPILLARY
GLUCOSE-CAPILLARY: 129 mg/dL — AB (ref 65–99)
GLUCOSE-CAPILLARY: 145 mg/dL — AB (ref 65–99)
GLUCOSE-CAPILLARY: 159 mg/dL — AB (ref 65–99)
Glucose-Capillary: 150 mg/dL — ABNORMAL HIGH (ref 65–99)

## 2016-07-03 LAB — CBC
HCT: 21 % — ABNORMAL LOW (ref 39.0–52.0)
HEMOGLOBIN: 7.2 g/dL — AB (ref 13.0–17.0)
MCH: 30.5 pg (ref 26.0–34.0)
MCHC: 34.3 g/dL (ref 30.0–36.0)
MCV: 89 fL (ref 78.0–100.0)
PLATELETS: 295 10*3/uL (ref 150–400)
RBC: 2.36 MIL/uL — AB (ref 4.22–5.81)
RDW: 14.2 % (ref 11.5–15.5)
WBC: 9.8 10*3/uL (ref 4.0–10.5)

## 2016-07-03 MED ORDER — CLONIDINE HCL 0.1 MG PO TABS
0.1000 mg | ORAL_TABLET | Freq: Two times a day (BID) | ORAL | Status: DC
  Administered 2016-07-04 (×2): 0.1 mg via ORAL
  Filled 2016-07-03 (×2): qty 1

## 2016-07-03 MED ORDER — SODIUM CHLORIDE 0.9 % IV SOLN
510.0000 mg | INTRAVENOUS | Status: DC
  Administered 2016-07-03: 510 mg via INTRAVENOUS
  Filled 2016-07-03 (×2): qty 17

## 2016-07-03 MED ORDER — LOPERAMIDE HCL 2 MG PO CAPS
2.0000 mg | ORAL_CAPSULE | Freq: Two times a day (BID) | ORAL | Status: DC
  Administered 2016-07-04 (×2): 2 mg via ORAL
  Filled 2016-07-03 (×2): qty 1
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
## 2016-07-03 NOTE — Progress Notes (Signed)
I met with pt, Wesley Burns, his Chinita Pester and Uncle at bedside to discuss his rehab needs for his CVA. Pt is in agreement to admission and does express he has issues with his ETOH recently after not excessively drinking since 04/2015 when hosiptalised due to binge drinking. I have discussed with Stroke service noted BP issues and renal issues. Renal consult pending. I await insurance approval for possible admission tomorrow pending medical readiness. [phone]
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
## 2016-07-03 NOTE — Progress Notes (Signed)
STROKE TEAM PROGRESS NOTE   SUBJECTIVE (INTERVAL HISTORY) Patient with worsening renal function. Had been referred in 2014 - saw as an IP in 2016. Renal consult called. Received prn BP meds during the night. Also abnormal LFTs today likely due to colchicine, will discontinue. Significant other at bedside. Asking about pain in abd from shots. Patient reports diarrhea is improved. Hope for clearance for renal - for possible discharge today to rehab.   OBJECTIVE Temp:  [97.9 F (36.6 C)-98.6 F (37 C)] 98.5 F (36.9 C) (08/02 1015) Pulse Rate:  [70-81] 70 (08/02 1015) Cardiac Rhythm: Normal sinus rhythm (08/02 0700) Resp:  [18-20] 20 (08/02 1015) BP: (145-174)/(0-96) 155/80 (08/02 1015) SpO2:  [96 %-100 %] 100 % (08/02 1015)  CBC:   Recent Labs Lab 06/28/16 0240  07/02/16 0544 07/03/16 0510  WBC 11.6*  < > 8.9 9.8  NEUTROABS 10.0*  --   --   --   HGB 9.0*  < > 8.0* 7.2*  HCT 27.3*  < > 24.4* 21.0*  MCV 90.4  < > 90.0 89.0  PLT 324  < > 255 295  < > = values in this interval not displayed.  Basic Metabolic Panel:   Recent Labs Lab 06/30/16 0844 07/01/16 0536 07/02/16 0544 07/03/16 0510  NA  --  130* 130* 132*  K  --  3.6 4.1 4.2  CL  --  106 106 108  CO2  --  16* 17* 15*  GLUCOSE  --  80 170* 137*  BUN  --  22* 27* 31*  CREATININE  --  3.84* 4.17* 4.23*  CALCIUM  --  7.5* 7.6* 7.4*  MG 1.1* 1.1*  --   --   PHOS 3.7  --   --   --     Lipid Panel:     Component Value Date/Time   CHOL 157 06/30/2016 0324   TRIG 105 06/30/2016 0324   HDL 64 06/30/2016 0324   CHOLHDL 2.5 06/30/2016 0324   VLDL 21 06/30/2016 0324   LDLCALC 72 06/30/2016 0324   HgbA1c:  Lab Results  Component Value Date   HGBA1C 4.5 (L) 06/30/2016   Urine Drug Screen:     Component Value Date/Time   LABOPIA NONE DETECTED 06/28/2016 0320   COCAINSCRNUR NONE DETECTED 06/28/2016 0320   LABBENZ NONE DETECTED 06/28/2016 0320   AMPHETMU NONE DETECTED 06/28/2016 0320   THCU POSITIVE (A)  06/28/2016 0320   LABBARB NONE DETECTED 06/28/2016 0320      IMAGING Dr. Roda Shutters has personally reviewed the radiological images below and agree with the radiology interpretations.  MRI Brain W/o Contrast 06/29/2016 1. Stable appearance of right basal ganglia hemorrhage measuring 4.6 x 2.2 x 2.6 cm with significant surrounding edema resulting and partial effacement of the right lateral ventricle but no significant midline shift. 2. Remote lacunar infarcts involving the posterior basal ganglia on the left also have foci of microhemorrhage. 3. Additional foci of susceptibility and remote hemorrhage in the brainstem. Multi focal areas suggest underlying vasculitis. 4. Bilateral mastoid effusions. No obstructing nasopharyngeal lesion is present.  Ct Head Wo Contrast 06/29/2016 1. No significant interval change in right basal ganglia hemorrhage with similar localized vasogenic edema and mild 3 mm of right-to-left shift.  2. No other new acute intracranial process.   06/28/2016 Right basal ganglia hemorrhage is unchanged. Mild midline shift to the left unchanged. No significant change since earlier today.   06/28/2016 Right basal ganglia acute hemorrhage with mild mass effect and 4  mm right-to-left midline shift. Close follow-up recommended.   Carotid ultrasound 06/28/2016 There is no obvious evidence of hemodynamically significant internal carotid artery stenosis bilaterally. Vertebral arteries are patent with antegrade flow.  2D echocardiogram - Left ventricle: The cavity size was normal. There was moderate concentric hypertrophy. Systolic function was normal. The estimated ejection fraction was in the range of 60% to 65%. Wall motion was normal; there were no regional wall motion abnormalities. The study is not technically sufficient to allow evaluation of LV diastolic function. - Aortic valve: There was no regurgitation. - Mitral valve: Calcified annulus. Transvalvular velocity was within the  normal range. There was no evidence for stenosis. There was trivial regurgitation. - Right ventricle: The cavity size was normal. Wall thickness was normal. Systolic function was normal. - Atrial septum: No defect or patent foramen ovale was identified by color flow Doppler. - Tricuspid valve: There was mild regurgitation. - Pulmonary arteries: Systolic pressure was within the normal range. PA peak pressure: 36 mm Hg (S).  R Knee Xray 07/01/2016 Soft tissue swelling at RIGHT knee region with RIGHT knee joint effusion and foci of gas consistent with prior joint aspiration. Osseous demineralization without acute bony abnormality.   PHYSICAL EXAM  Temp:  [97.9 F (36.6 C)-98.6 F (37 C)] 98.5 F (36.9 C) (08/02 1015) Pulse Rate:  [70-81] 70 (08/02 1015) Resp:  [18-20] 20 (08/02 1015) BP: (145-174)/(0-96) 155/80 (08/02 1015) SpO2:  [96 %-100 %] 100 % (08/02 1015)  General - Well nourished, well developed, in no apparent distress.  Ophthalmologic - Fundi not visualized due to noncooperation.  Cardiovascular - Regular rate and rhythm.  Mental Status -  Level of arousal and orientation to time, place, and person were intact. Language including expression, naming, repetition, comprehension was assessed and found intact. Fund of Knowledge was assessed and was intact.  Cranial Nerves II - XII - II - Visual field intact OU. III, IV, VI - Extraocular movements intact. V - Facial sensation intact bilaterally. VII - left facial droop. VIII - Hearing & vestibular intact bilaterally. X - Palate elevates symmetrically. XI - Chin turning & shoulder shrug intact bilaterally. XII - Tongue protrusion midline  Motor Strength - The patient's strength was normal in all extremities except LUE 4/5 with dexterity difficult at left hand and LLE 5-/5 and pronator drift was present on the left. Pain at right knee improved. S/p right anterior foot amputation. Bulk was normal and fasciculations were  absent.   Motor Tone - Muscle tone was assessed at the neck and appendages and was normal.  Reflexes - The patient's reflexes were 1+ in all extremities and he had no pathological reflexes.  Sensory - Light touch, temperature/pinprick were assessed and were symmetrical.    Coordination - The patient had normal movements in the hands with no ataxia or dysmetria.  Tremor was absent.  Gait and Station - not tested due to safety concerns.   ASSESSMENT/PLAN [name] is a 50 y.o. 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 He did not receive IV t-PA due to hemorrhage.  Stroke:  Right basal ganglia ICH secondary to uncontrolled hypertension.  Resultant  Left sided weakness improving  MRI - see above - right basal ganglia hemorrhage, 12-15cc  Carotid Doppler - unremarkable as noted  above  2D Echo EF 60-65%. No source of embolus   LDL - 72  HgbA1c 4.5  VTE prophylaxis - SCDs and heparin Diet Carb Modified Fluid consistency: Thin; Room service appropriate? Yes  No antithrombotic prior to admission, now on No antithrombotic secondary to hemorrhage.   Ongoing aggressive stroke risk factor management  Therapy recommendations:  CIR. Coordinator following - for family meeting today  Disposition: Pending. Possible discharge today.  CKD   Worsening creatinine  4.23  Nephrology consulted  Abnormal UA, started on rocephin. Final urine culture negative. Stopped rocephin.   Colchicine discontinued  Hypertensive Emergency  BP up to 182/118  Cardene stopped at 3am       Stable this am  On hone meds of lasix, avapro and inderal.    Added amlodipine 10, decrease lasix to 20 mg po daily.  Received 3  doses of labetalol during the night for BP > 160  Keep BP goal at < 160  Further BP meds based on renal recommendations  Renal consult appreciated  Hyperlipidemia  Home meds: No lipid lowering medications prior to admission  LDL 72, goal < 70  Hold off statin due to acute ICH  Diabetes type II  Diet controlled  Monitor ac & hs  given corticosteroids yesterday  Improved over night  On Sensitive SSI   Tobacco abuse  Current smoker  Smoking cessation counseling provided  Pt is willing to quit  Gout   R knee pain/swelling s/p aspiration, now knee pain improved, less edema  Ortho consulted  Right knee aspiration - positive SBC, no organisms  Colchicine discontinued due to abnormal LFTs  Uric acid elevated 8.2  Anemia  Hb 9.0->7.5->7.0->8.0  Iron 7,  ferritin level 194  On iron pills  Close monitoring  Other Stroke Risk Factors  ETOH use, advised to drink no more than 2 drink(s) a day  UDS positive for THCU  Other Active Problems  Hyponatremia - 129 -> 132  B hip replacements in the past ([place])  Diarrhea. Cdiff. Neg. Diarrhea continues - put on loperamide 2mg  bid  Hospital day # 5  [facility] See Amion for Pager information 07/03/2016 11:02 AM   I, the attending vascular neurologist, have personally obtained a history, examined the patient, evaluated laboratory data, individually viewed imaging studies and agree with radiology interpretations. I obtained additional history from pt's significant other at bedside. Together with the NP/PA, we formulated the assessment and plan of care which reflects our mutual decision.  I have made any additions or clarifications directly to the above note and agree with the findings and plan as currently documented.   Pt neuro stable. Worsening kidney function and needed PRN labetalol for BP. Will request renal consult. Also has elevated LFTs, will d/c colchicine. Still has  diarrhea, will add loperamide. Right knee gout much improved. Pending CIR.  09/02/2016, MD PhD Stroke Neurology 07/03/2016 4:16 PM        To contact Stroke Continuity provider, please refer to 09/02/2016. After hours, contact General Neurology
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
## 2016-07-03 NOTE — Progress Notes (Addendum)
Pt  Systolic BP>160 gave 20 mg of labetalol will reassess BP in 20 min. Rechecked systolic BP was 156

## 2016-07-03 NOTE — Progress Notes (Signed)
Occupational Therapy Treatment Patient Details Name: Wesley Burns MRN: [id number] DOB: [birth year] Today's Date: 07/03/2016    History of present illness 50 yo male admit with large right basal ganglia acute hematoma with mass effect.  Hx DM, HTN, CKD, anemia, osteomyelitis s/p right midfoot amputation.   OT comments  Pt demonstrates transfer from bed to chair this session. Pt incontinence of bowel and break down of sacrum. Pt could benefit from repositioning and barrier cream for incontinence. Pt sitting in chair to allow him to engage in CIR admission discussion and be an active participant.   Follow Up Recommendations  CIR    Equipment Recommendations  Other (comment)    Recommendations for Other Services Rehab consult    Precautions / Restrictions Precautions Precautions: Fall Precaution Comments: R foot amputation- boyfriend Phil to bring in tennis shoe Restrictions Weight Bearing Restrictions: No       Mobility Bed Mobility Overal bed mobility: Needs Assistance Bed Mobility: Rolling;Supine to Sit Rolling: Min assist;Max assist   Supine to sit: Min assist     General bed mobility comments: pt requires more (A) to the L side and c/o R knee pain. pt able to bend L knee into flexion to rotate hips  Transfers Overall transfer level: Needs assistance   Transfers: Sit to/from Stand;Stand Pivot Transfers Sit to Stand: +2 physical assistance;Min assist Stand pivot transfers: +2 physical assistance;Min assist       General transfer comment: Michele Mcalpine present and asked to bring in patients shoes to help with transfers. Phil reports pt shoves paper and socks into the toe of shoe instead of a custom splint for R foot.     Balance Overall balance assessment: Needs assistance Sitting-balance support: Bilateral upper extremity supported;Feet supported Sitting balance-Leahy Scale: Fair       Standing balance-Leahy Scale: Poor                     ADL Overall  ADL's : Needs assistance/impaired                 Upper Body Dressing : Minimal assistance;Bed level   Lower Body Dressing: Total assistance Lower Body Dressing Details (indicate cue type and reason): don socks       Toileting - Clothing Manipulation Details (indicate cue type and reason): total (A) for peri care. pt with incontinence of bowel. pt with redness at sacrum due to incontinence. Barrier cream applied and pt repositioned to decr risk of skin break down       General ADL Comments: Pt able to (A) with bed mobility to the R this session but requires more (A) to the L side.  Pt awaiting meeting with CIR and family      Vision                     Perception     Praxis      Cognition   Behavior During Therapy: Flat affect Overall Cognitive Status: Impaired/Different from baseline Area of Impairment: Attention;Following commands   Current Attention Level: Selective    Following Commands: Follows multi-step commands with increased time Safety/Judgement: Decreased awareness of safety;Decreased awareness of deficits Awareness: Emergent   General Comments: pt with delayed response to questions and needs incr time to follow commands    Extremity/Trunk Assessment               Exercises     Shoulder Instructions       General Comments  Pertinent Vitals/ Pain       Pain Assessment: Faces Faces Pain Scale: Hurts little more Pain Location: R knee Pain Descriptors / Indicators: Discomfort Pain Intervention(s): Monitored during session;Premedicated before session;Repositioned  Home Living                                      Lives With: Significant other    Prior Functioning/Environment              Frequency Min 2X/week     Progress Toward Goals  OT Goals(current goals can now be found in the care plan section)  Progress towards OT goals: Progressing toward goals  Acute Rehab OT Goals Patient Stated Goal:  none stated OT Goal Formulation: With patient Time For Goal Achievement: 07/15/16 Potential to Achieve Goals: Good ADL Goals Pt Will Perform Grooming: with supervision;sitting Pt Will Perform Upper Body Bathing: with supervision;sitting Pt Will Perform Lower Body Bathing: with min assist;sit to/from stand Pt Will Transfer to Toilet: with supervision;bedside commode Additional ADL Goal #1: pt will complete bed mobility min (A) as precursor to adls  Plan Discharge plan remains appropriate    Co-evaluation    PT/OT/SLP Co-Evaluation/Treatment: Yes Reason for Co-Treatment: For patient/therapist safety   OT goals addressed during session: ADL's and self-care;Strengthening/ROM      End of Session Equipment Utilized During Treatment: Gait belt   Activity Tolerance Patient tolerated treatment well   Patient Left in chair;with call bell/phone within reach;with bed alarm set;with nursing/sitter in room   Nurse Communication Mobility status;Precautions        Time: 1478-2956 OT Time Calculation (min): 21 min  Charges: OT General Charges $OT Visit: 1 Procedure OT Treatments $Self Care/Home Management : 8-22 mins  Boone Master B 07/03/2016, 12:00 PM  Mateo Flow   OTR/L Pager: [phone] Office: [phone] .

## 2016-07-03 NOTE — Progress Notes (Signed)
Pt BP is now < 160 at 145/77 his anxiety, diarrhea and nausea are better as well. Will continue to monitor.

## 2016-07-03 NOTE — Progress Notes (Signed)
Physical Therapy Treatment Patient Details Name: Wesley Burns MRN: [id number] DOB: [birth year] Today's Date: 07/03/2016    History of Present Illness 50 yo male admit with large right basal ganglia acute hematoma with mass effect.  Hx DM, HTN, CKD, anemia, osteomyelitis s/p right midfoot amputation.    PT Comments    Patient is progressing toward mobility goals and demonstrated improvement in cognition this session. Current plan remains appropriate.   Follow Up Recommendations  CIR     Equipment Recommendations  Rolling walker with 5" wheels    Recommendations for Other Services Rehab consult     Precautions / Restrictions Precautions Precautions: Fall Precaution Comments: R foot amputation- boyfriend Phil to bring in tennis shoe Restrictions Weight Bearing Restrictions: No    Mobility  Bed Mobility Overal bed mobility: Needs Assistance Bed Mobility: Rolling;Supine to Sit Rolling: Min assist;Max assist   Supine to sit: Min assist     General bed mobility comments: max A to L side with assist to flex L knee due to c/o pain and min A to roll R side; use of bed rails; assist to elevate trunk into sitting and scoot hips to EOB with use of bed pad  Transfers Overall transfer level: Needs assistance Equipment used: 2 person hand held assist Transfers: Sit to/from [employer] Transfers Sit to Stand: +2 physical assistance;Min assist Stand pivot transfers: +2 physical assistance;Min assist       General transfer comment: cues for hand placement and technique; +2 HHA to pivot EOB to recliner  Ambulation/Gait                 Stairs            Wheelchair Mobility    Modified Rankin (Stroke Patients Only)       Balance Overall balance assessment: Needs assistance Sitting-balance support: Bilateral upper extremity supported;Feet supported Sitting balance-Leahy Scale: Fair       Standing balance-Leahy Scale: Poor                       Cognition Arousal/Alertness: Awake/alert Behavior During Therapy: Flat affect Overall Cognitive Status: Impaired/Different from baseline Area of Impairment: Attention;Following commands   Current Attention Level: Selective   Following Commands: Follows multi-step commands with increased time Safety/Judgement: Decreased awareness of safety;Decreased awareness of deficits Awareness: Emergent   General Comments: pt with delayed response to questions and needs incr time to follow commands    Exercises      General Comments General comments (skin integrity, edema, etc.): pt's partner to bring shoes next session      Pertinent Vitals/Pain Pain Assessment: Faces Faces Pain Scale: Hurts little more Pain Location: R knee Pain Descriptors / Indicators: Aching;Discomfort Pain Intervention(s): Monitored during session;Premedicated before session;Repositioned    Home Living                      Prior Function            PT Goals (current goals can now be found in the care plan section) Acute Rehab PT Goals Patient Stated Goal: none stated Progress towards PT goals: Progressing toward goals    Frequency  Min 4X/week    PT Plan Current plan remains appropriate    Co-evaluation PT/OT/SLP Co-Evaluation/Treatment: Yes Reason for Co-Treatment: For patient/therapist safety PT goals addressed during session: Mobility/safety with mobility OT goals addressed during session: ADL's and self-care;Strengthening/ROM     End of Session Equipment Utilized During Treatment: Gait  belt Activity Tolerance: Patient tolerated treatment well Patient left: with call bell/phone within reach;with chair alarm set;in chair     Time: 1657-9038 PT Time Calculation (min) (ACUTE ONLY): 21 min  Charges:                       G Codes:      Derek Mound, PTA Pager: [phone]   07/03/2016, 1:44 PM

## 2016-07-03 NOTE — Progress Notes (Signed)
I have insurance approval to admit pt to inpt rehab when medically ready. I am off until 8/14. Fae Pippin will follow up in my absence and can be reached at [phone].

## 2016-07-04 ENCOUNTER — Inpatient Hospital Stay (HOSPITAL_COMMUNITY)
Admission: RE | Admit: 2016-07-04 | Discharge: 2016-07-19 | DRG: 057 | Disposition: A | Discharge status: Home Health | Payer: 59 | Source: Intra-hospital | Attending: Physical Medicine & Rehabilitation | Admitting: Physical Medicine & Rehabilitation

## 2016-07-04 ENCOUNTER — Encounter (HOSPITAL_COMMUNITY): Payer: Self-pay | Admitting: *Deleted

## 2016-07-04 DIAGNOSIS — N184 Chronic kidney disease, stage 4 (severe): Secondary | ICD-10-CM

## 2016-07-04 DIAGNOSIS — M109 Gout, unspecified: Secondary | ICD-10-CM | POA: Diagnosis present

## 2016-07-04 DIAGNOSIS — E1122 Type 2 diabetes mellitus with diabetic chronic kidney disease: Secondary | ICD-10-CM | POA: Diagnosis present

## 2016-07-04 DIAGNOSIS — Z992 Dependence on renal dialysis: Secondary | ICD-10-CM

## 2016-07-04 DIAGNOSIS — R339 Retention of urine, unspecified: Secondary | ICD-10-CM

## 2016-07-04 DIAGNOSIS — I619 Nontraumatic intracerebral hemorrhage, unspecified: Secondary | ICD-10-CM | POA: Diagnosis present

## 2016-07-04 DIAGNOSIS — R600 Localized edema: Secondary | ICD-10-CM

## 2016-07-04 DIAGNOSIS — E11610 Type 2 diabetes mellitus with diabetic neuropathic arthropathy: Secondary | ICD-10-CM

## 2016-07-04 DIAGNOSIS — Z79899 Other long term (current) drug therapy: Secondary | ICD-10-CM | POA: Diagnosis not present

## 2016-07-04 DIAGNOSIS — N2581 Secondary hyperparathyroidism of renal origin: Secondary | ICD-10-CM | POA: Diagnosis present

## 2016-07-04 DIAGNOSIS — Z87820 Personal history of traumatic brain injury: Secondary | ICD-10-CM

## 2016-07-04 DIAGNOSIS — E1142 Type 2 diabetes mellitus with diabetic polyneuropathy: Secondary | ICD-10-CM | POA: Diagnosis present

## 2016-07-04 DIAGNOSIS — G479 Sleep disorder, unspecified: Secondary | ICD-10-CM

## 2016-07-04 DIAGNOSIS — F1010 Alcohol abuse, uncomplicated: Secondary | ICD-10-CM | POA: Diagnosis present

## 2016-07-04 DIAGNOSIS — I10 Essential (primary) hypertension: Secondary | ICD-10-CM | POA: Diagnosis not present

## 2016-07-04 DIAGNOSIS — K5900 Constipation, unspecified: Secondary | ICD-10-CM | POA: Diagnosis present

## 2016-07-04 DIAGNOSIS — N185 Chronic kidney disease, stage 5: Secondary | ICD-10-CM | POA: Diagnosis present

## 2016-07-04 DIAGNOSIS — R609 Edema, unspecified: Secondary | ICD-10-CM

## 2016-07-04 DIAGNOSIS — E872 Acidosis: Secondary | ICD-10-CM | POA: Diagnosis present

## 2016-07-04 DIAGNOSIS — R0602 Shortness of breath: Secondary | ICD-10-CM

## 2016-07-04 DIAGNOSIS — I610 Nontraumatic intracerebral hemorrhage in hemisphere, subcortical: Secondary | ICD-10-CM

## 2016-07-04 DIAGNOSIS — I120 Hypertensive chronic kidney disease with stage 5 chronic kidney disease or end stage renal disease: Secondary | ICD-10-CM | POA: Diagnosis present

## 2016-07-04 DIAGNOSIS — J90 Pleural effusion, not elsewhere classified: Secondary | ICD-10-CM

## 2016-07-04 DIAGNOSIS — Z89431 Acquired absence of right foot: Secondary | ICD-10-CM | POA: Diagnosis not present

## 2016-07-04 DIAGNOSIS — E875 Hyperkalemia: Secondary | ICD-10-CM | POA: Diagnosis present

## 2016-07-04 DIAGNOSIS — M25562 Pain in left knee: Secondary | ICD-10-CM

## 2016-07-04 DIAGNOSIS — R071 Chest pain on breathing: Secondary | ICD-10-CM

## 2016-07-04 DIAGNOSIS — E611 Iron deficiency: Secondary | ICD-10-CM | POA: Diagnosis present

## 2016-07-04 DIAGNOSIS — D649 Anemia, unspecified: Secondary | ICD-10-CM | POA: Diagnosis present

## 2016-07-04 DIAGNOSIS — I69154 Hemiplegia and hemiparesis following nontraumatic intracerebral hemorrhage affecting left non-dominant side: Principal | ICD-10-CM

## 2016-07-04 DIAGNOSIS — E871 Hypo-osmolality and hyponatremia: Secondary | ICD-10-CM | POA: Diagnosis present

## 2016-07-04 DIAGNOSIS — K7460 Unspecified cirrhosis of liver: Secondary | ICD-10-CM | POA: Diagnosis present

## 2016-07-04 DIAGNOSIS — R0989 Other specified symptoms and signs involving the circulatory and respiratory systems: Secondary | ICD-10-CM

## 2016-07-04 DIAGNOSIS — D62 Acute posthemorrhagic anemia: Secondary | ICD-10-CM | POA: Diagnosis present

## 2016-07-04 DIAGNOSIS — G8194 Hemiplegia, unspecified affecting left nondominant side: Secondary | ICD-10-CM | POA: Diagnosis not present

## 2016-07-04 DIAGNOSIS — G8114 Spastic hemiplegia affecting left nondominant side: Secondary | ICD-10-CM

## 2016-07-04 DIAGNOSIS — Z96643 Presence of artificial hip joint, bilateral: Secondary | ICD-10-CM | POA: Diagnosis not present

## 2016-07-04 DIAGNOSIS — N186 End stage renal disease: Secondary | ICD-10-CM | POA: Diagnosis present

## 2016-07-04 LAB — GLUCOSE, CAPILLARY
GLUCOSE-CAPILLARY: 151 mg/dL — AB (ref 65–99)
GLUCOSE-CAPILLARY: 159 mg/dL — AB (ref 65–99)
GLUCOSE-CAPILLARY: 70 mg/dL (ref 65–99)
Glucose-Capillary: 122 mg/dL — ABNORMAL HIGH (ref 65–99)

## 2016-07-04 LAB — URINALYSIS, ROUTINE W REFLEX MICROSCOPIC
Bilirubin Urine: NEGATIVE
Glucose, UA: 100 mg/dL — AB
KETONES UR: NEGATIVE mg/dL
LEUKOCYTES UA: NEGATIVE
NITRITE: NEGATIVE
PH: 6 (ref 5.0–8.0)
Protein, ur: >300 mg/dL — AB
SPECIFIC GRAVITY, URINE: 1.016 (ref 1.005–1.030)

## 2016-07-04 LAB — CBC
HEMATOCRIT: 22.8 % — AB (ref 39.0–52.0)
HEMOGLOBIN: 7.6 g/dL — AB (ref 13.0–17.0)
MCH: 30.4 pg (ref 26.0–34.0)
MCHC: 33.3 g/dL (ref 30.0–36.0)
MCV: 91.2 fL (ref 78.0–100.0)
Platelets: 310 10*3/uL (ref 150–400)
RBC: 2.5 MIL/uL — ABNORMAL LOW (ref 4.22–5.81)
RDW: 14.5 % (ref 11.5–15.5)
WBC: 6.1 10*3/uL (ref 4.0–10.5)

## 2016-07-04 LAB — VAS US CAROTID
LCCADSYS: 114 cm/s
LCCAPDIAS: 18 cm/s
LCCAPSYS: 173 cm/s
LEFT ECA DIAS: -21 cm/s
LEFT VERTEBRAL DIAS: 13 cm/s
LICAPDIAS: -33 cm/s
Left CCA dist dias: 27 cm/s
Left ICA dist dias: -25 cm/s
Left ICA dist sys: -101 cm/s
Left ICA prox sys: -120 cm/s
RCCADSYS: -120 cm/s
RCCAPDIAS: 20 cm/s
RCCAPSYS: 103 cm/s
RIGHT ECA DIAS: -19 cm/s
RIGHT VERTEBRAL DIAS: 13 cm/s

## 2016-07-04 LAB — URINE MICROSCOPIC-ADD ON: WBC, UA: NONE SEEN WBC/hpf (ref 0–5)

## 2016-07-04 LAB — COMPREHENSIVE METABOLIC PANEL
ALBUMIN: 1.7 g/dL — AB (ref 3.5–5.0)
ALT: 127 U/L — ABNORMAL HIGH (ref 17–63)
AST: 184 U/L — AB (ref 15–41)
Alkaline Phosphatase: 221 U/L — ABNORMAL HIGH (ref 38–126)
Anion gap: 8 (ref 5–15)
BUN: 39 mg/dL — AB (ref 6–20)
CHLORIDE: 110 mmol/L (ref 101–111)
CO2: 13 mmol/L — ABNORMAL LOW (ref 22–32)
Calcium: 7.3 mg/dL — ABNORMAL LOW (ref 8.9–10.3)
Creatinine, Ser: 4.15 mg/dL — ABNORMAL HIGH (ref 0.61–1.24)
GFR calc Af Amer: 18 mL/min — ABNORMAL LOW (ref >60)
GFR, EST NON AFRICAN AMERICAN: 15 mL/min — AB (ref >60)
Glucose, Bld: 149 mg/dL — ABNORMAL HIGH (ref 65–99)
POTASSIUM: 4.7 mmol/L (ref 3.5–5.1)
Sodium: 131 mmol/L — ABNORMAL LOW (ref 135–145)
Total Bilirubin: 0.6 mg/dL (ref 0.3–1.2)
Total Protein: 4.6 g/dL — ABNORMAL LOW (ref 6.5–8.1)

## 2016-07-04 LAB — PROTEIN / CREATININE RATIO, URINE
Creatinine, Urine: 79.66 mg/dL
PROTEIN CREATININE RATIO: 7.78 mg/mg{creat} — AB (ref 0.00–0.15)
TOTAL PROTEIN, URINE: 620 mg/dL

## 2016-07-04 LAB — PHOSPHORUS: Phosphorus: 3.1 mg/dL (ref 2.5–4.6)

## 2016-07-04 MED ORDER — MAGNESIUM OXIDE 400 (241.3 MG) MG PO TABS
400.0000 mg | ORAL_TABLET | Freq: Every day | ORAL | Status: DC
  Administered 2016-07-05 – 2016-07-19 (×15): 400 mg via ORAL
  Filled 2016-07-04 (×15): qty 1

## 2016-07-04 MED ORDER — METHOCARBAMOL 500 MG PO TABS
500.0000 mg | ORAL_TABLET | Freq: Four times a day (QID) | ORAL | Status: DC | PRN
  Administered 2016-07-07 – 2016-07-19 (×15): 500 mg via ORAL
  Filled 2016-07-04 (×15): qty 1

## 2016-07-04 MED ORDER — FERROUS SULFATE 325 (65 FE) MG PO TABS
325.0000 mg | ORAL_TABLET | Freq: Three times a day (TID) | ORAL | Status: DC
  Administered 2016-07-04 – 2016-07-19 (×44): 325 mg via ORAL
  Filled 2016-07-04 (×41): qty 1

## 2016-07-04 MED ORDER — SODIUM BICARBONATE 650 MG PO TABS
650.0000 mg | ORAL_TABLET | Freq: Three times a day (TID) | ORAL | Status: DC
  Administered 2016-07-04: 650 mg via ORAL
  Filled 2016-07-04: qty 1

## 2016-07-04 MED ORDER — HEPARIN SODIUM (PORCINE) 5000 UNIT/ML IJ SOLN: 5000.0000 [IU] | Freq: Three times a day (TID) | INTRAMUSCULAR | Status: DC

## 2016-07-04 MED ORDER — AMLODIPINE BESYLATE 10 MG PO TABS
10.0000 mg | ORAL_TABLET | Freq: Every day | ORAL | Status: DC
  Administered 2016-07-05 – 2016-07-19 (×15): 10 mg via ORAL
  Filled 2016-07-04 (×15): qty 1

## 2016-07-04 MED ORDER — ONDANSETRON HCL 4 MG PO TABS: 4.0000 mg | ORAL_TABLET | Freq: Four times a day (QID) | ORAL | Status: DC | PRN

## 2016-07-04 MED ORDER — ACETAMINOPHEN 650 MG RE SUPP: 650.0000 mg | RECTAL | Status: DC | PRN

## 2016-07-04 MED ORDER — HEPARIN SODIUM (PORCINE) 5000 UNIT/ML IJ SOLN
5000.0000 [IU] | Freq: Three times a day (TID) | INTRAMUSCULAR | Status: DC
  Administered 2016-07-04 – 2016-07-19 (×41): 5000 [IU] via SUBCUTANEOUS
  Filled 2016-07-04 (×43): qty 1

## 2016-07-04 MED ORDER — SORBITOL 70 % SOLN: 30.0000 mL | Freq: Every day | Status: DC | PRN

## 2016-07-04 MED ORDER — FUROSEMIDE 20 MG PO TABS
20.0000 mg | ORAL_TABLET | Freq: Every day | ORAL | Status: DC
  Administered 2016-07-05 – 2016-07-09 (×5): 20 mg via ORAL
  Filled 2016-07-04 (×5): qty 1

## 2016-07-04 MED ORDER — CLONIDINE HCL 0.1 MG PO TABS
0.1000 mg | ORAL_TABLET | Freq: Two times a day (BID) | ORAL | Status: DC
  Administered 2016-07-04 – 2016-07-19 (×30): 0.1 mg via ORAL
  Filled 2016-07-04 (×30): qty 1

## 2016-07-04 MED ORDER — INSULIN ASPART 100 UNIT/ML ~~LOC~~ SOLN
0.0000 [IU] | Freq: Three times a day (TID) | SUBCUTANEOUS | Status: DC
  Administered 2016-07-05: 2 [IU] via SUBCUTANEOUS
  Administered 2016-07-05 – 2016-07-06 (×3): 1 [IU] via SUBCUTANEOUS
  Administered 2016-07-07: 2 [IU] via SUBCUTANEOUS
  Administered 2016-07-07 – 2016-07-17 (×12): 1 [IU] via SUBCUTANEOUS
  Administered 2016-07-17: 2 [IU] via SUBCUTANEOUS
  Administered 2016-07-18 (×2): 1 [IU] via SUBCUTANEOUS

## 2016-07-04 MED ORDER — PANTOPRAZOLE SODIUM 40 MG PO TBEC
40.0000 mg | DELAYED_RELEASE_TABLET | Freq: Every day | ORAL | Status: DC
  Administered 2016-07-05 – 2016-07-19 (×15): 40 mg via ORAL
  Filled 2016-07-04 (×15): qty 1

## 2016-07-04 MED ORDER — LABETALOL HCL 200 MG PO TABS
300.0000 mg | ORAL_TABLET | Freq: Three times a day (TID) | ORAL | Status: DC
  Administered 2016-07-04: 300 mg via ORAL
  Filled 2016-07-04: qty 1

## 2016-07-04 MED ORDER — SODIUM CHLORIDE 0.9 % IV SOLN: INTRAVENOUS | Status: DC

## 2016-07-04 MED ORDER — ACETAMINOPHEN 325 MG PO TABS
650.0000 mg | ORAL_TABLET | ORAL | Status: DC | PRN
  Administered 2016-07-10 – 2016-07-19 (×14): 650 mg via ORAL
  Filled 2016-07-04 (×15): qty 2

## 2016-07-04 MED ORDER — ONDANSETRON HCL 4 MG/2ML IJ SOLN: 4.0000 mg | Freq: Four times a day (QID) | INTRAMUSCULAR | Status: DC | PRN

## 2016-07-04 NOTE — Progress Notes (Signed)
Inpatient Rehabilitation  I wait acute MD clearance to admit to IP Rehab today.  Please call with questions.    Charlane Ferretti., CCC/SLP Admission Coordinator  [facility] Inpatient Rehabilitation  Cell [phone]

## 2016-07-04 NOTE — Progress Notes (Signed)
07/04/16 1345 nursing To unit rehab alert and oriented patient per bed accompanied by RN and NT. Oriented to unit set up ; fall prevention contract signed by patient. Questions answered.
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
## 2016-07-04 NOTE — Plan of Care (Signed)
Problem: RH BLADDER ELIMINATION Goal: RH STG MANAGE BLADDER WITH ASSISTANCE STG Manage Bladder With Assistance  Outcome: Not Progressing Pt requested to use condom cath

## 2016-07-04 NOTE — Interval H&P Note (Signed)
Wesley Burns was admitted today to Inpatient Rehabilitation with the diagnosis of right ICH.  The patient's history has been reviewed, patient examined, and there is no change in status.  Patient continues to be appropriate for intensive inpatient rehabilitation.  I have reviewed the patient's chart and labs.  Questions were answered to the patient's satisfaction. The PAPE has been reviewed and assessment remains appropriate.  SWARTZ,ZACHARY T 07/04/2016, 7:24 PM

## 2016-07-04 NOTE — Progress Notes (Signed)
Patient is discharged from room 5M11 at this time. Alert and in stable condition.IVsite d/c'd as well as tele. Report given to receiving nurse Edson Snowball, RN at [facility] with all questions answered. Left unit via bed with all belongings at side.

## 2016-07-04 NOTE — Progress Notes (Signed)
Inpatient Rehabilitation  Have MD approval and patient in agreement with plan to admit to IP Rehab today.  Please call with questions.    Charlane Ferretti., CCC/SLP Admission Coordinator  [facility] Inpatient Rehabilitation  Cell [phone]

## 2016-07-04 NOTE — Progress Notes (Signed)
Wesley Burns Progress Note  S: Patient feels well this morning. He was able to eat well. Feels his strength is improving. UOP 300 ccs yesterday.  O:  Medications: Infusions: . sodium chloride 75 mL/hr at 07/04/16 0819   Scheduled Medications: . amLODipine  10 mg Oral Daily  . cloNIDine  0.1 mg Oral BID  . ferrous sulfate  325 mg Oral TID WC  . ferumoxytol  510 mg Intravenous Weekly  . furosemide  20 mg Oral Daily  . heparin subcutaneous  5,000 Units Subcutaneous Q8H  . insulin aspart  0-9 Units Subcutaneous TID WC  . loperamide  2 mg Oral BID  . magnesium oxide  400 mg Oral Daily  . pantoprazole  40 mg Oral Daily  . potassium chloride  10 mEq Oral Daily  . propranolol  80 mg Oral BID    Scheduled Meds: . amLODipine  10 mg Oral Daily  . cloNIDine  0.1 mg Oral BID  . ferrous sulfate  325 mg Oral TID WC  . ferumoxytol  510 mg Intravenous Weekly  . furosemide  20 mg Oral Daily  . heparin subcutaneous  5,000 Units Subcutaneous Q8H  . insulin aspart  0-9 Units Subcutaneous TID WC  . loperamide  2 mg Oral BID  . magnesium oxide  400 mg Oral Daily  . pantoprazole  40 mg Oral Daily  . potassium chloride  10 mEq Oral Daily  . propranolol  80 mg Oral BID   Continuous Infusions: . sodium chloride 75 mL/hr at 07/04/16 0819   PRN Meds:.acetaminophen **OR** acetaminophen, cyclobenzaprine, labetalol, lidocaine, methylPREDNISolone acetate, ondansetron (ZOFRAN) IV  BP (!) 156/78 (BP Location: Right Arm)   Pulse 70   Temp 98 F (36.7 C) (Oral)   Resp 20   Ht 5\' 8"  (1.727 m)   Wt 155 lb 10.3 oz (70.6 kg)   SpO2 100%   BMI 23.67 kg/m    Intake/Output Summary (Last 24 hours) at 07/04/16 1026 Last data filed at 07/04/16 0103  Gross per 24 hour  Intake              120 ml  Output              300 ml  Net             -180 ml    Weight change:   Wt Readings from Last 5 Encounters:  06/28/16 155 lb 10.3 oz (70.6 kg)  11/15/15 145 lb (65.8 kg)  09/04/15 155 lb (70.3 kg)   05/03/15 171 lb (77.6 kg)  10/07/13 156 lb (70.8 kg)    EXAM: General: resting in bed, eating breakfast, no acute distress Cardiac: RRR, no rubs, murmurs or gallops Pulm: clear to auscultation bilaterally, moving normal volumes of air Abd: soft, nontender, nondistended, BS present Ext: warm and well perfused, no pedal edema Neuro: alert and oriented X3    Recent Labs Lab 06/30/16 0844 07/01/16 0536 07/02/16 0544 07/03/16 0510 07/04/16 0538  NA  --  130* 130* 132* 131*  K  --  3.6 4.1 4.2 4.7  CL  --  106 106 108 110  CO2  --  16* 17* 15* 13*  GLUCOSE  --  80 170* 137* 149*  BUN  --  22* 27* 31* 39*  CREATININE  --  3.84* 4.17* 4.23* 4.15*  CALCIUM  --  7.5* 7.6* 7.4* 7.3*  MG 1.1* 1.1*  --   --   --   PHOS 3.7  --   --   --  3.1     Recent Labs Lab 07/02/16 0544 07/03/16 0510 07/04/16 0538  AST 31 285* 184*  ALT 16* 103* 127*  ALKPHOS 140* 238* 221*  BILITOT 0.1* 1.1 0.6  PROT 5.0* 4.6* 4.6*  ALBUMIN 1.7* 1.6* 1.7*    Recent Labs Lab 06/28/16 0436  AMMONIA 34     Recent Labs Lab 06/28/16 0240  06/30/16 1457 07/01/16 0536 07/02/16 0544 07/03/16 0510 07/04/16 0538  WBC 11.6*  < > 10.8* 9.0 8.9 9.8 6.1  NEUTROABS 10.0*  --   --   --   --   --   --   HGB 9.0*  < > 7.4* 7.0* 8.0* 7.2* 7.6*  HCT 27.3*  < > 21.7* 21.3* 24.4* 21.0* 22.8*  MCV 90.4  < > 88.9 91.4 90.0 89.0 91.2  PLT 324  < > 261 218 255 295 310  < > = values in this interval not displayed.    Recent Labs Lab 07/03/16 0626 07/03/16 1204 07/03/16 1647 07/03/16 2117 07/04/16 0630  GLUCAP 129* 145* 159* 150* 151*   Lab Results  Component Value Date   IRON 7 (L) 07/01/2016   TIBC 179 (L) 07/01/2016   FERRITIN 194 07/02/2016   Dg Knee 2 Views Right  Result Date: 07/01/2016 CLINICAL DATA:  RIGHT knee swelling for 4 days, history gout EXAM: RIGHT KNEE - 3 VIEW COMPARISON:  11/17/2007 FINDINGS: Osseous demineralization. Diffuse soft tissue swelling RIGHT knee region. Joint  spaces preserved. RIGHT knee joint effusion present with foci of gas located in the suprapatellar region. Review of EMR indicates patient had a RIGHT knee aspiration performed on 07/01/2016. No acute fracture, dislocation, or bone destruction. Specifically no juxta-articular erosions identified. Tiny spur at inferior articular margin of patella. Scattered vascular calcifications. IMPRESSION: Soft tissue swelling at RIGHT knee region with RIGHT knee joint effusion and foci of gas consistent with prior joint aspiration. Osseous demineralization without acute bony abnormality. Electronically Signed   By: [name] M.D.   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 He also required Nicardipine drip for hypertensive emergency (off drip on 7/31). We were asked to see because of worsening renal function, creatinine increase from 3.78-?4.23.  Assessment/Plan:  CKD 4/5: Worsened renal function since May 2016. SCr trending up this admission from 3.78 to 4.23. (Has had proteinuria longstanding, 2.8 grams 08/2013)  UOP 300 mLs yesterday. Likely 2/2 diabetic/hypertensive nephropathy. Changes suggestive of hepatic cirrhosis seen per U/S May 2016 adds hepatorenal syndrome to the differential if other causes excluded including prerenal disease, glomerulonephritis (normal complements this admission;  neg HIV, Hep B, Hep C, ANCA , kappa/lambda ratio 04/2015), Repeat Urine study: 0-5 rbcs, >300 protein, Pro/Cr 7.78. Normal size kidneys 12.6, 12.3 cm 04/2015.  -Avoid Nephrotoxins -Monitor renal function -Establish PTH status (pending) -Discontinued ARB with this very low GFR (done) -Cannot pursue permanent vascular access with recent stroke -Added sodium bicarbonate 650 mg TID  HTN: HTNsive crisis requiring Nicardipine  drip on admission (off drip since 7/31). BPs slightly elevated but better controlled now on Amlodipine, Lasix, Clonidine, Propranolol, and prn Labetalol. -Added some low dose clonidine and stopped the irbesartan given his low GFR . -Changing propranolol to labetolol 300 BID -Hydralazine also an option  Anemia: Iron 7, TIBC 179, Ferritin 194. Hgb 7.6. -Receiving Ferrous sulfate 325  mg TID -Give Feraheme 510 IV X 2 doses, spaced a week apart (received 1st dose 8/2) -Monitor for signs of bleeding, transfuse PRBCs as needed  Gout: Right knee flare this admission s/p aspiration and Depomedrol injection with relief. Colchicine was added, but now discontinued in light of increased SCr and LFTs. -Agree with holding Colchicine  Elevated LFTs: AST/ALT jumped to 285/103 this AM from normal range. Possibly related to Colchicine. Abdominal U/S in May 2016 showed nodular appearance compatible with hepatic cirrhosis. Has reported history of EtOH abuse. -? History of cirrhosis -Monitor LFTs -EtOH cessation  T2DM: Hgb A1c 4.5, diet-controlled.  Vishal Patel  [facility] PGY-2  I have seen and examined this patient and agree with plan and assessment in the above note.  Pt right on the cusp of CKD 5. Workup past and present compatible with diabetic nephropathy. Cannot proceed with permanent access with recent stroke. Adjusting BP meds to try and get back a few GFR points (stopping ARB). Repleting iron IV, determining PTH status.  We will continue to follow the patient on the rehab service.  Gwendolyne Welford B,MD 07/04/2016 12:41 PM
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
## 2016-07-04 NOTE — Progress Notes (Signed)
Speech Language Pathology Treatment: Cognitive-Linquistic  Patient Details Name: Wesley Burns MRN: [id number] DOB: 21-Nov-1966 Today's Date: 07/04/2016 Time: 1045-1100 SLP Time Calculation (min) (ACUTE ONLY): 15 min  Assessment / Plan / Recommendation Clinical Impression  Cognitive treatment provided to address safety awareness and short term memory. Pt able to recall physical and occupational therapy visits; however, required mod verbal cues to recall activities and recommendations for safe mobility. Pt was oriented x4, able to maintain sustained attention to tasks presented. Reviewed short term memory compensatory strategies with pt (taking notes, utilizing calendar or alerts in phone, using pill box to keep track of meds). Pt stated he feels "back to normal" with cognitive skills. Physical and occupational therapies continue to note deficits with safety awareness and following multi-step commands. Will continue to follow. Plan is for pt to move on to CIR later today.    HPI HPI: Pt is a 50 y.o. male with PMH of diabetes mellitus, hypertension, chronic kidney disease, anemia and osteomyelitis, brought to the emergency room 7/28 after being found unable to speak and move his left side. MRI 7/29 showed stable R basal ganglia hemorrhage measuring 4.6 x 2.2 x 2.6 cm with significant surrounding edema resulting and partial effacement of the right lateral ventricle but no significant midline shift. Pt seen for diet check this a.m and reported feeling "a little foggy"; was somewhat slow to respond in conversation appearing to need extra processing time. RN reported not having concerns with speech/ language but that pt seemed "a little off". PT reported that pt seemed to have decreased selective attention this a.m during eval. Cognitive-linguistic eval ordered to further assess.      SLP Plan  Continue with current plan of care     Recommendations                Oral Care Recommendations: Oral care  BID Follow up Recommendations: Inpatient Rehab Plan: Continue with current plan of care     GO                Metro Kung, MA, CCC-SLP 07/04/2016, 11:05 AM [phone]

## 2016-07-04 NOTE — Progress Notes (Signed)
Physical Therapy Treatment Patient Details Name: Wesley Burns MRN: [id number] DOB: [birth year] Today's Date: 07/04/2016    History of Present Illness 50 yo male admit with large right basal ganglia acute hematoma with mass effect.  Hx DM, HTN, CKD, anemia, osteomyelitis s/p right midfoot amputation.    PT Comments    Patient continues to progress and tolerated gait training this session. Pt reported feeling very tired despite sleeping well last night. Current plan remains appropriate.   Follow Up Recommendations  CIR     Equipment Recommendations  Rolling walker with 5" wheels    Recommendations for Other Services Rehab consult     Precautions / Restrictions Precautions Precautions: Fall Precaution Comments: R foot amputation- boyfriend Phil to bring in tennis shoe Restrictions Weight Bearing Restrictions: No    Mobility  Bed Mobility Overal bed mobility: Needs Assistance Bed Mobility: Rolling;Sidelying to Sit Rolling: Min assist Sidelying to sit: Mod assist       General bed mobility comments: min A to roll with assist to mobilize bilat LE and flex R knee; mod  A to elevate trunk into sitting and scoot hips to EOB  Transfers Overall transfer level: Needs assistance Equipment used: Rolling walker (2 wheeled) Transfers: Sit to/from Stand Sit to Stand: +2 physical assistance;Min assist         General transfer comment: cues for hand placement and safe use of AD; assist to power up into standing   Ambulation/Gait Ambulation/Gait assistance: Min assist;+2 safety/equipment Ambulation Distance (Feet): 70 Feet Assistive device: Rolling walker (2 wheeled) Gait Pattern/deviations: Step-through pattern;Decreased stride length;Trunk flexed;Narrow base of support;Decreased stance time - right;Decreased step length - left     General Gait Details: multimodal cues for posture, weight shifting, and advancement of L foot   Stairs            Wheelchair Mobility     Modified Rankin (Stroke Patients Only)       Balance     Sitting balance-Leahy Scale: Fair       Standing balance-Leahy Scale: Poor                      Cognition Arousal/Alertness: Awake/alert Behavior During Therapy: Flat affect Overall Cognitive Status: Impaired/Different from baseline Area of Impairment: Attention;Following commands;Safety/judgement   Current Attention Level: Selective   Following Commands: Follows multi-step commands with increased time Safety/Judgement: Decreased awareness of safety;Decreased awareness of deficits Awareness: Emergent        Exercises      General Comments        Pertinent Vitals/Pain Pain Assessment: Faces Pain Score: 2  Faces Pain Scale: Hurts a little bit Pain Location: R knee with transitional movements Pain Descriptors / Indicators: Sore Pain Intervention(s): Monitored during session;Repositioned    Home Living                      Prior Function            PT Goals (current goals can now be found in the care plan section) Acute Rehab PT Goals Patient Stated Goal: take a nap Progress towards PT goals: Progressing toward goals    Frequency  Min 4X/week    PT Plan Current plan remains appropriate    Co-evaluation             End of Session Equipment Utilized During Treatment: Gait belt Activity Tolerance: Patient tolerated treatment well Patient left: with call bell/phone within reach;with chair alarm set;in chair  Time: 7829-5621 PT Time Calculation (min) (ACUTE ONLY): 20 min  Charges:  $Gait Training: 8-22 mins                    G Codes:      Derek Mound, PTA Pager: [phone]   07/04/2016, 11:22 AM

## 2016-07-05 ENCOUNTER — Inpatient Hospital Stay (HOSPITAL_COMMUNITY): Payer: 59 | Admitting: Occupational Therapy

## 2016-07-05 ENCOUNTER — Inpatient Hospital Stay (HOSPITAL_COMMUNITY): Payer: 59 | Admitting: Speech Pathology

## 2016-07-05 ENCOUNTER — Inpatient Hospital Stay (HOSPITAL_COMMUNITY): Payer: 59 | Admitting: Physical Therapy

## 2016-07-05 DIAGNOSIS — G8194 Hemiplegia, unspecified affecting left nondominant side: Secondary | ICD-10-CM

## 2016-07-05 DIAGNOSIS — D62 Acute posthemorrhagic anemia: Secondary | ICD-10-CM

## 2016-07-05 LAB — GLUCOSE, CAPILLARY
GLUCOSE-CAPILLARY: 143 mg/dL — AB (ref 65–99)
GLUCOSE-CAPILLARY: 188 mg/dL — AB (ref 65–99)
Glucose-Capillary: 164 mg/dL — ABNORMAL HIGH (ref 65–99)
Glucose-Capillary: 136 mg/dL — ABNORMAL HIGH (ref 65–99)

## 2016-07-05 LAB — CBC WITH DIFFERENTIAL/PLATELET
Basophils Absolute: 0 10*3/uL (ref 0.0–0.1)
Basophils Relative: 0 %
Eosinophils Absolute: 0.1 10*3/uL (ref 0.0–0.7)
Eosinophils Relative: 1 %
HCT: 21.4 % — ABNORMAL LOW (ref 39.0–52.0)
HEMOGLOBIN: 7.1 g/dL — AB (ref 13.0–17.0)
LYMPHS ABS: 1.1 10*3/uL (ref 0.7–4.0)
LYMPHS PCT: 20 %
MCH: 29.8 pg (ref 26.0–34.0)
MCHC: 33.2 g/dL (ref 30.0–36.0)
MCV: 89.9 fL (ref 78.0–100.0)
Monocytes Absolute: 0.5 10*3/uL (ref 0.1–1.0)
Monocytes Relative: 10 %
NEUTROS ABS: 3.8 10*3/uL (ref 1.7–7.7)
NEUTROS PCT: 69 %
Platelets: 370 10*3/uL (ref 150–400)
RBC: 2.38 MIL/uL — AB (ref 4.22–5.81)
RDW: 14.6 % (ref 11.5–15.5)
WBC: 5.5 10*3/uL (ref 4.0–10.5)

## 2016-07-05 LAB — COMPREHENSIVE METABOLIC PANEL
ALK PHOS: 211 U/L — AB (ref 38–126)
ALT: 99 U/L — AB (ref 17–63)
AST: 76 U/L — ABNORMAL HIGH (ref 15–41)
Albumin: 1.7 g/dL — ABNORMAL LOW (ref 3.5–5.0)
Anion gap: 8 (ref 5–15)
BUN: 47 mg/dL — AB (ref 6–20)
CALCIUM: 7.6 mg/dL — AB (ref 8.9–10.3)
CO2: 14 mmol/L — AB (ref 22–32)
CREATININE: 4.24 mg/dL — AB (ref 0.61–1.24)
Chloride: 109 mmol/L (ref 101–111)
GFR, EST AFRICAN AMERICAN: 17 mL/min — AB (ref >60)
GFR, EST NON AFRICAN AMERICAN: 15 mL/min — AB (ref >60)
Glucose, Bld: 123 mg/dL — ABNORMAL HIGH (ref 65–99)
Potassium: 4.8 mmol/L (ref 3.5–5.1)
Sodium: 131 mmol/L — ABNORMAL LOW (ref 135–145)
Total Bilirubin: 0.5 mg/dL (ref 0.3–1.2)
Total Protein: 4.6 g/dL — ABNORMAL LOW (ref 6.5–8.1)

## 2016-07-05 LAB — PHOSPHORUS: Phosphorus: 3.1 mg/dL (ref 2.5–4.6)

## 2016-07-05 LAB — HEPATITIS PANEL, ACUTE
HEP B S AG: NEGATIVE
Hep A IgM: NEGATIVE
Hep B C IgM: NEGATIVE

## 2016-07-05 LAB — PARATHYROID HORMONE, INTACT (NO CA): PTH: 128 pg/mL — AB (ref 15–65)

## 2016-07-05 MED ORDER — SODIUM BICARBONATE 650 MG PO TABS
650.0000 mg | ORAL_TABLET | Freq: Three times a day (TID) | ORAL | Status: DC
  Administered 2016-07-05 – 2016-07-07 (×6): 650 mg via ORAL
  Filled 2016-07-05 (×6): qty 1

## 2016-07-05 MED ORDER — LABETALOL HCL 300 MG PO TABS
300.0000 mg | ORAL_TABLET | Freq: Three times a day (TID) | ORAL | Status: DC
  Administered 2016-07-05 – 2016-07-15 (×30): 300 mg via ORAL
  Filled 2016-07-05 (×30): qty 1

## 2016-07-05 MED ORDER — CALCITRIOL 0.25 MCG PO CAPS
0.2500 ug | ORAL_CAPSULE | Freq: Every day | ORAL | Status: DC
  Administered 2016-07-05 – 2016-07-19 (×15): 0.25 ug via ORAL
  Filled 2016-07-05 (×16): qty 1
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
## 2016-07-05 NOTE — Care Management Note (Signed)
Inpatient Rehabilitation Center Individual Statement of Services  Patient Name:  Wesley Burns  Date:  07/05/2016  Welcome to the Inpatient Rehabilitation Center.  Our goal is to provide you with an individualized program based on your diagnosis and situation, designed to meet your specific needs.  With this comprehensive rehabilitation program, you will be expected to participate in at least 3 hours of rehabilitation therapies Monday-Friday, with modified therapy programming on the weekends.  Your rehabilitation program will include the following services:  Physical Therapy (PT), Occupational Therapy (OT), Speech Therapy (ST), 24 hour per day rehabilitation nursing, Therapeutic Recreaction (TR), Neuropsychology, Case Management (Social Worker), Rehabilitation Medicine, Nutrition Services and Pharmacy Services  Weekly team conferences will be held on Wednesdays to discuss your progress.  Your Social Worker will talk with you frequently to get your input and to update you on team discussions.  Team conferences with you and your family in attendance may also be held.  Expected length of stay: 18-22 days  Overall anticipated outcome: supervision/ min assist  Depending on your progress and recovery, your program may change. Your Social Worker will coordinate services and will keep you informed of any changes. Your Social Worker's name and contact numbers are listed  below.  The following services may also be recommended but are not provided by the Inpatient Rehabilitation Center:   Driving Evaluations  Home Health Rehabiltiation Services  Outpatient Rehabilitation Services  Vocational Rehabilitation   Arrangements will be made to provide these services after discharge if needed.  Arrangements include referral to agencies that provide these services.  Your insurance has been verified to be:  [name] Your primary doctor is:  [name]  Pertinent information will be shared with your doctor and  your insurance company.  Social Worker:  [place], [place] [phone] or ([phone]   Information discussed with and copy given to patient by: Amada Jupiter, 07/05/2016, 2:46 PM
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
## 2016-07-05 NOTE — Progress Notes (Signed)
Patient information reviewed and entered into eRehab system by Kaitlin Ardito, RN, CRRN, PPS Coordinator.  Information including medical coding and functional independence measure will be reviewed and updated through discharge.    
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
## 2016-07-05 NOTE — Progress Notes (Signed)
[facility] Progress Note  S: Patient feels well this morning. Flat affect. No acute events overnight. UOP 550 ccs yesterday.  O:  Medications: Infusions:   Scheduled Medications: . amLODipine  10 mg Oral Daily  . cloNIDine  0.1 mg Oral BID  . ferrous sulfate  325 mg Oral TID WC  . furosemide  20 mg Oral Daily  . heparin  5,000 Units Subcutaneous Q8H  . insulin aspart  0-9 Units Subcutaneous TID WC  . magnesium oxide  400 mg Oral Daily  . pantoprazole  40 mg Oral Daily    Scheduled Meds: . amLODipine  10 mg Oral Daily  . cloNIDine  0.1 mg Oral BID  . ferrous sulfate  325 mg Oral TID WC  . furosemide  20 mg Oral Daily  . heparin  5,000 Units Subcutaneous Q8H  . insulin aspart  0-9 Units Subcutaneous TID WC  . magnesium oxide  400 mg Oral Daily  . pantoprazole  40 mg Oral Daily   Continuous Infusions:   PRN Meds:.acetaminophen **OR** acetaminophen, methocarbamol, ondansetron **OR** ondansetron (ZOFRAN) IV, sorbitol  BP (!) 155/87   Pulse 71   Temp 97.9 F (36.6 C) (Oral)   Resp 18   Ht 5\' 11"  (1.803 m)   Wt 192 lb 4.8 oz (87.2 kg)   SpO2 97%   BMI 26.82 kg/m    Intake/Output Summary (Last 24 hours) at 07/05/16 1236 Last data filed at 07/05/16 0600  Gross per 24 hour  Intake              480 ml  Output              550 ml  Net              -70 ml    Weight change:   Wt Readings from Last 5 Encounters:  07/04/16 192 lb 4.8 oz (87.2 kg)  06/28/16 155 lb 10.3 oz (70.6 kg)  11/15/15 145 lb (65.8 kg)  09/04/15 155 lb (70.3 kg)  05/03/15 171 lb (77.6 kg)    EXAM: General: resting in bed, eating breakfast, no acute distress Cardiac: RRR, no rubs, murmurs or gallops Pulm: clear to auscultation bilaterally, moving normal volumes of air Abd: soft, nontender, nondistended, BS present Ext: warm and well perfused, trace pedal edema     Recent Labs Lab 06/30/16 0844 07/01/16 0536  07/03/16 0510 07/04/16 0538 07/05/16 0538 07/05/16 1120  NA  --   130*  < > 132* 131* 131*  --   K  --  3.6  < > 4.2 4.7 4.8  --   CL  --  106  < > 108 110 109  --   CO2  --  16*  < > 15* 13* 14*  --   GLUCOSE  --  80  < > 137* 149* 123*  --   BUN  --  22*  < > 31* 39* 47*  --   CREATININE  --  3.84*  < > 4.23* 4.15* 4.24*  --   CALCIUM  --  7.5*  < > 7.4* 7.3* 7.6*  --   MG 1.1* 1.1*  --   --   --   --   --   PHOS 3.7  --   --   --  3.1  --  3.1  < > = values in this interval not displayed.   Recent Labs Lab 07/03/16 0510 07/04/16 0538 07/05/16 0538  AST 285* 184* 76*  ALT 103* 127* 99*  ALKPHOS 238* 221* 211*  BILITOT 1.1 0.6 0.5  PROT 4.6* 4.6* 4.6*  ALBUMIN 1.6* 1.7* 1.7*     Recent Labs Lab 07/01/16 0536 07/02/16 0544 07/03/16 0510 07/04/16 0538 07/05/16 0538  WBC 9.0 8.9 9.8 6.1 5.5  NEUTROABS  --   --   --   --  3.8  HGB 7.0* 8.0* 7.2* 7.6* 7.1*  HCT 21.3* 24.4* 21.0* 22.8* 21.4*  MCV 91.4 90.0 89.0 91.2 89.9  PLT 218 255 295 310 370      Recent Labs Lab 07/04/16 1118 07/04/16 1636 07/04/16 2125 07/05/16 0636 07/05/16 1205  GLUCAP 159* 70 122* 143* 164*   Lab Results  Component Value Date   IRON 7 (L) 07/01/2016   TIBC 179 (L) 07/01/2016   FERRITIN 194 07/02/2016   Dg Knee 2 Views Right  Result Date: 07/01/2016 CLINICAL DATA:  RIGHT knee swelling for 4 days, history gout EXAM: RIGHT KNEE - 3 VIEW COMPARISON:  11/17/2007 FINDINGS: Osseous demineralization. Diffuse soft tissue swelling RIGHT knee region. Joint spaces preserved. RIGHT knee joint effusion present with foci of gas located in the suprapatellar region. Review of EMR indicates patient had a RIGHT knee aspiration performed on 07/01/2016. No acute fracture, dislocation, or bone destruction. Specifically no juxta-articular erosions identified. Tiny spur at inferior articular margin of patella. Scattered vascular calcifications. IMPRESSION: Soft tissue swelling at RIGHT knee region with RIGHT knee joint effusion and foci of gas consistent with prior joint  aspiration. Osseous demineralization without acute bony abnormality. Electronically Signed   By: [name] M.D.   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 He also required Nicardipine drip for hypertensive emergency (off drip on 7/31). We were asked to see because of worsening renal function, creatinine increase from 3.78->4.23.  Assessment/Plan:  CKD 4/5: Worsened renal function since May 2016. SCr trending up this admission from 3.78 to 4.23. 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 Normal size kidneys 12.6, 12.3 cm 04/2015. -Avoid Nephrotoxins -Monitor renal function -PTH 128 -> will add Calcitriol 0.25 mcg daily -Discontinued ARB with this very low GFR (done) -Cannot pursue permanent vascular access with recent stroke -Added sodium bicarbonate 650 mg TID  HTN: HTNsive crisis requiring Nicardipine drip on admission (off drip since 7/31). BPs slightly elevated, seems some medication changes made yesterday were lost on transfer to CIR. Will add back adjusted regimen from yesterday. -Continue Clonidine 0.1 mg BID -Changing home propranolol to labetolol 300 BID -Hydralazine also an option if needed -No ACE/ARB -Monitor to avoid dropping blood pressure too low  Anemia: Iron 7, TIBC 179, Ferritin 194. Hgb 7.6. -Ferrous sulfate 325 mg TID -Give Feraheme 510 IV X 2 doses, spaced a week apart (received 1st dose 8/2) -Add Aranesp 100 mcg/week -Monitor for signs of bleeding, transfuse PRBCs as needed  Gout: Right knee flare this admission s/p aspiration and Depomedrol injection with  relief. Colchicine was added, but now discontinued in light of increased SCr and LFTs. -Agree with holding Colchicine  Elevated LFTs: AST/ALT jumped to 285/103 this AM from normal range. Possibly related to Colchicine. Abdominal U/S in May 2016  showed nodular appearance compatible with hepatic cirrhosis. Has reported history of EtOH abuse. LFTs trending down. -EtOH cessation  T2DM: Hgb A1c 4.5, diet-controlled.  Vishal Patel  [facility] PGY-2  I have seen and examined this patient and agree with plan and assessment in the above note with renal recommendations/intervention highlighted. Have made some med adjustments that didn't get carried over when transferred to rehab. Monitoring renal function.   Evia Goldsmith B,MD 07/05/2016 1:53 PM

## 2016-07-06 ENCOUNTER — Inpatient Hospital Stay (HOSPITAL_COMMUNITY): Payer: 59 | Admitting: Occupational Therapy

## 2016-07-06 ENCOUNTER — Inpatient Hospital Stay (HOSPITAL_COMMUNITY): Payer: 59 | Admitting: Speech Pathology

## 2016-07-06 ENCOUNTER — Inpatient Hospital Stay (HOSPITAL_COMMUNITY): Payer: 59 | Admitting: Physical Therapy

## 2016-07-06 LAB — GLUCOSE, CAPILLARY
GLUCOSE-CAPILLARY: 120 mg/dL — AB (ref 65–99)
GLUCOSE-CAPILLARY: 130 mg/dL — AB (ref 65–99)
GLUCOSE-CAPILLARY: 133 mg/dL — AB (ref 65–99)
Glucose-Capillary: 120 mg/dL — ABNORMAL HIGH (ref 65–99)

## 2016-07-06 MED ORDER — DARBEPOETIN ALFA 100 MCG/0.5ML IJ SOSY
100.0000 ug | PREFILLED_SYRINGE | INTRAMUSCULAR | Status: DC
  Administered 2016-07-08 – 2016-07-15 (×2): 100 ug via SUBCUTANEOUS
  Filled 2016-07-06 (×2): qty 0.5

## 2016-07-06 NOTE — Progress Notes (Signed)
Physical Therapy Session Note  Patient Details  Name: Wesley Burns MRN: [id number] Date of Birth: [birth year]  Today's Date: 07/06/2016 PT Individual Time: 1519-1550 PT Individual Time Calculation (min): 31 min    Short Term Goals: Week 1:  PT Short Term Goal 1 (Week 1): Pt will perform bed mobility on flat bed with min A PT Short Term Goal 2 (Week 1): Pt will perform bed <> chair transfers consistently with min A PT Short Term Goal 3 (Week 1): Pt will perform w/c mobility x 150' with min A for UE strengthening/endurance PT Short Term Goal 4 (Week 1): Pt will negotiate 4 steps with 2 rails and min A PT Short Term Goal 5 (Week 1): Pt will ambulate x 150' with LRAD and min A  Skilled Therapeutic Interventions/Progress Updates:    Pt received semi reclined in bed, awake. Required encouragement, coaxing to agree to therapy. Performed supine > sit with mod A, HOB elevated using bed rail; cueing for LUE attention/management, assist to prevent posterior LOB upon sitting EOB. Stand pivot transfer from bed <> w/c with no AD, max A due to posterior trunk lean. Transported pt to ortho gym with Total A for time management. Performed stand pivot transfer from w/c <> car with RW and mod A, cueing and increased time for BLE management. Upon return to pt room, pt requesting to return to bed despite PT education on importance of trying to stay in chair to increase upright tolerance. Sit > supine with mod A, BLE management, cueing for LUE attention. Departed with pt semi reclined in bed, bed alarm on, partner Michele Mcalpine present.  Therapy Documentation Precautions:  Precautions Precautions: Fall Precaution Comments: h/o R foot amputation, L charcot foot, bilat THA, falls Restrictions Weight Bearing Restrictions: No Vital Signs: Therapy Vitals Temp: 98.4 F (36.9 C) Temp Source: Oral Pulse Rate: 74 Resp: 18 BP: (!) 147/78 Patient Position (if appropriate): Lying Oxygen Therapy SpO2: 99 % O2 Device: Not  Delivered Pain: Pain Assessment Pain Assessment: No/denies pain   See Function Navigator for Current Functional Status.   Therapy/Group: Individual Therapy  Hobble, Lorenda Ishihara 07/06/2016, 4:08 PM
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
## 2016-07-06 NOTE — Progress Notes (Signed)
Occupational Therapy Session Note  Patient Details  Name: Wesley Burns MRN: [id number] Date of Birth: [birth year]  Today's Date: 07/06/2016 OT Individual Time: 0900-1000 OT Individual Time Calculation (min): 60 min     Short Term Goals: Week 1:  OT Short Term Goal 1 (Week 1): Pt will complete LB ADLs with Mod A with use of AE OT Short Term Goal 2 (Week 1): Pt will complete toilet transfers with Min A OT Short Term Goal 3 (Week 1): Pt will engage in 5 minutes of therapuetic activity without rest breaks to increase activity tolerance for self care   Skilled Therapeutic Interventions/Progress Updates:   Though patient stated, "I am just so tired and do not have any energy at all," and "I do not sleep so well at night either.   I do not know if it's my brain or body or what," he concurred to attempt to don/doff tredded socks, bed mobility, trunk strengthening, positioning, and grooming.  He stated the nursing staff had earlier this am helped clean his periarea following the condom catheter having slipped off apparently while he slept, and his bed and pants became wet.   Therefore, he stated he did not need to wash his periarea again this session.  He dozed off intermittently during the session and required verbal or tactile cues to participate.   He stated that he is on an iron supplement and questioned whether low iron may be contributing to his fatigue as well as other factors.  Patient asked to stay in bed at end of session and rest.  His call bell was placed within reach.  Therapy Documentation Precautions:  Precautions Precautions: Fall Precaution Comments: h/o R foot amputation, L charcot foot, bilat THA, falls Restrictions Weight Bearing Restrictions: No  Pain: Pain Assessment Pain Assessment: No/denies pain   See Function Navigator for Current Functional Status.   Therapy/Group: Individual Therapy  Rozelle Logan 07/06/2016, 3:21 PM

## 2016-07-06 NOTE — Progress Notes (Signed)
Speech Language Pathology Daily Session Note  Patient Details  Name: Wesley Burns MRN: [id number] Date of Birth: June 13, 1966  Today's Date: 07/06/2016 SLP Individual Time: 1300-1340 SLP Individual Time Calculation (min): 40 min   Short Term Goals: Week 1: SLP Short Term Goal 1 (Week 1): Pt will selectively attend to functional tasks in a mildly distracting environment for 10 minutes with supervision verbal cues for redirection.  SLP Short Term Goal 2 (Week 1): Pt will complete familiar functional tasks with supervision verbal cues for functional problem solving.   SLP Short Term Goal 3 (Week 1): Pt will recognize and correct errors in the moment during familiar, functional tasks with supervision verbal cues.   SLP Short Term Goal 4 (Week 1): Pt will utilize external aids to recall daily information with supervision verbal cues.    Skilled Therapeutic Interventions:   Skilled treatment session focused on addressing cognition goals. SLP facilitated session by providing Mod faded to Min verbal cues to recognize and correct errors during a basic money management task.  Patient able to recall general daily events with therapies with Min question cues, but required Max assist question cues to identify current deficits.  Given that awareness impacts safety care plan modified.     Function:  Cognition Comprehension Comprehension assist level: Follows basic conversation/direction with extra time/assistive device  Expression   Expression assist level: Expresses basic needs/ideas: With extra time/assistive device  Social Interaction Social Interaction assist level: Interacts appropriately 90% of the time - Needs monitoring or encouragement for participation or interaction.  Problem Solving Problem solving assist level: Solves basic 50 - 74% of the time/requires cueing 25 - 49% of the time  Memory Memory assist level: Recognizes or recalls 50 - 74% of the time/requires cueing 25 - 49% of the time     Pain Pain Assessment Pain Assessment: No/denies pain  Therapy/Group: Individual Therapy  Charlane Ferretti., CCC-SLP [phone]  Wesley Burns 07/06/2016, 1:44 PM

## 2016-07-06 NOTE — Progress Notes (Signed)
Occupational Therapy Session Note  Patient Details  Name: Wesley Burns MRN: [id number] Date of Birth: 02/04/1966  Today's Date: 07/06/2016 OT Individual Time: 0900-1000 OT Individual Time Calculation (min): 60 min     Short Term Goals: Week 1:  OT Short Term Goal 1 (Week 1): Pt will complete LB ADLs with Mod A with use of AE OT Short Term Goal 2 (Week 1): Pt will complete toilet transfers with Min A OT Short Term Goal 3 (Week 1): Pt will engage in 5 minutes of therapuetic activity without rest breaks to increase activity tolerance for self care   Skilled Therapeutic Interventions/Progress Updates:   Pt was lying supine in bed upon skilled OT arrival. Pt reported feelings of increased fatigue and required max encouragement to participate in therapy. "I just want to sleep all the time." Pt provided education on health benefits of getting up with therapy and purpose of therapy at CIR with pt verbalizing understanding. Pt agreed to get out of bed for light UB ADL completion to increase activity tolerance. Pt required increased assistance(Mod A) for transfers today due to fatigue. Vitals assessed with pt up in w/c with BP read of 141/79, 100% 02 sats on room air and 65 pulse rate. Pt reported that BP has been high lately, though pt was asymptomatic of dizziness or nausea. Pt completed UB ADLs with supervision w/c level at sink with instruction on energy conservation principles. Pt completed tub bench transfer with simulated threshold to simulate home tub setup. Pt required Min A for clearing threshold with LEs and Mod A for transfer from w/c. At end of session, pt was returned to bed and repositioned for comfort. Bed alarm set and all needs within reach. Pt continues to benefit from tx focusing on increasing activity tolerance. No c/o pain, just extreme fatigue.   Therapy Documentation Precautions:  Precautions Precautions: Fall Precaution Comments: h/o R foot amputation, L charcot foot, bilat  THA, falls Restrictions Weight Bearing Restrictions: No General:   Vital Signs: Therapy Vitals Temp: 98.4 F (36.9 C) Temp Source: Oral Pulse Rate: 74 Resp: 18 BP: (!) 147/78 Patient Position (if appropriate): Lying Oxygen Therapy SpO2: 99 % O2 Device: Not Delivered Pain: Pain Assessment Pain Assessment: No/denies pain ADL: ADL Upper Body Bathing: Supervision/safety Where Assessed-Upper Body Bathing: Sitting at sink Lower Body Bathing: Moderate assistance Where Assessed-Lower Body Bathing: Sitting at sink Upper Body Dressing: Supervision/safety Where Assessed-Upper Body Dressing: Sitting at sink Lower Body Dressing: Maximal assistance Where Assessed-Lower Body Dressing: Sitting at sink Toileting: Minimal assistance Where Assessed-Toileting: Teacher, adult education: Moderate assistance Toilet Transfer Method: Stand pivot Toilet Transfer Equipment: Grab bars, Raised toilet seat     See Function Navigator for Current Functional Status.   Therapy/Group: Individual Therapy  Skyleigh Windle A Tzion Wedel 07/06/2016, 6:09 PM

## 2016-07-06 NOTE — Progress Notes (Signed)
[facility] Progress Note  Subjective/Interval Events:  Patient seen and examined this morning. Feels well but tired after starting rehab. Having some urine output. Graduated from [employer] in 1988.  [employer] major.  Objective:  I/O last 3 completed shifts: In: 1560 [P.O.:1560] Out: 850 [Urine:850] Total I/O In: 360 [P.O.:360] Out: 400 [Urine:400]   Physical Examination: BP (!) 141/79   Pulse 65   Temp 98.3 F (36.8 C) (Oral)   Resp 18   Ht 5\' 11"  (1.803 m)   Wt 87.2 kg (192 lb 4.8 oz)   SpO2 100%   BMI 26.82 kg/m   General: resting in bed, eating lunch, no acute distress Cardiac: RRR, no rubs, murmurs or gallops Pulm: clear to auscultation bilaterally, moving normal volumes of air Abd: soft, nontender, nondistended, BS present Ext: warm and well perfused, trace pedal edema, wearing TED hose   Scheduled Medications: . amLODipine  10 mg Oral Daily  . calcitRIOL  0.25 mcg Oral Daily  . cloNIDine  0.1 mg Oral BID  . ferrous sulfate  325 mg Oral TID WC  . furosemide  20 mg Oral Daily  . heparin  5,000 Units Subcutaneous Q8H  . insulin aspart  0-9 Units Subcutaneous TID WC  . labetalol  300 mg Oral TID  . magnesium oxide  400 mg Oral Daily  . pantoprazole  40 mg Oral Daily  . sodium bicarbonate  650 mg Oral TID    Scheduled Meds: . amLODipine  10 mg Oral Daily  . calcitRIOL  0.25 mcg Oral Daily  . cloNIDine  0.1 mg Oral BID  . ferrous sulfate  325 mg Oral TID WC  . furosemide  20 mg Oral Daily  . heparin  5,000 Units Subcutaneous Q8H  . insulin aspart  0-9 Units Subcutaneous TID WC  . labetalol  300 mg Oral TID  . magnesium oxide  400 mg Oral Daily  . pantoprazole  40 mg Oral Daily  . sodium bicarbonate  650 mg Oral TID     PRN Meds:.acetaminophen **OR** acetaminophen, methocarbamol, ondansetron **OR** ondansetron (ZOFRAN) IV, sorbitol   Recent Labs Lab 06/30/16 0844 07/01/16 0536  07/03/16 0510 07/04/16 0538 07/05/16 0538 07/05/16 1120  NA  --   130*  < > 132* 131* 131*  --   K  --  3.6  < > 4.2 4.7 4.8  --   CL  --  106  < > 108 110 109  --   CO2  --  16*  < > 15* 13* 14*  --   GLUCOSE  --  80  < > 137* 149* 123*  --   BUN  --  22*  < > 31* 39* 47*  --   CREATININE  --  3.84*  < > 4.23* 4.15* 4.24*  --   CALCIUM  --  7.5*  < > 7.4* 7.3* 7.6*  --   MG 1.1* 1.1*  --   --   --   --   --   PHOS 3.7  --   --   --  3.1  --  3.1  < > = values in this interval not displayed.   Recent Labs Lab 07/03/16 0510 07/04/16 0538 07/05/16 0538  AST 285* 184* 76*  ALT 103* 127* 99*  ALKPHOS 238* 221* 211*  BILITOT 1.1 0.6 0.5  PROT 4.6* 4.6* 4.6*  ALBUMIN 1.6* 1.7* 1.7*     Recent Labs Lab 07/01/16 07/03/16 07/02/16 0544 07/03/16 0510 07/04/16 09/03/16  07/05/16 0538  WBC 9.0 8.9 9.8 6.1 5.5  NEUTROABS  --   --   --   --  3.8  HGB 7.0* 8.0* 7.2* 7.6* 7.1*  HCT 21.3* 24.4* 21.0* 22.8* 21.4*  MCV 91.4 90.0 89.0 91.2 89.9  PLT 218 255 295 310 370      Recent Labs Lab 07/05/16 1205 07/05/16 1637 07/05/16 2037 07/06/16 0655 07/06/16 1201  GLUCAP 164* 136* 188* 120* 130*   Results for Wesley Burns, Wesley Burns (MRN [id number]) as of 07/06/2016 13:21  Ref. 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 He required Nicardipine drip for hypertensive emergency (off drip on 7/31). We were asked to see because of worsening renal function, creatinine increase from 3.78->4.23. 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 Has had proteinuria  longstanding, 2.8 grams 08/2013  2/2 diabetic/hypertensive nephropathy; normal complements this admission;  neg HIV, Hep B, Hep C, ANCA , kappa/lambda ratio 04/2015, repeat urine study: 0-5 rbcs, >300 protein, Pro/Cr 7.78. Normal size kidneys 12.6, 12.3 cm 04/2015). Creatinine 8/4 up to 4.24, labs not done today.  -  Avoiding Nephrotoxins -  Monitoring renal function - labs not drawn today.  Creatinine last recorded 8/4 was 4.24  - Discontinued ARB with this very low GFR -  Cannot pursue permanent vascular access yet with recent stroke -  Added sodium bicarbonate 650 mg TID -  I plan to see him in the office after his discharge from rehab  Secondary hyperparathyroidism -  PTH 128 --> added Calcitriol 0.25 mcg daily -  Last phos good at 3.1  HTN: Hypertensive crisis requiring Nicardipine drip on admission (off drip since 7/31). BPs slightly elevated, some medication changes made were lost on transfer to CIR. Have been added back. -Continue Clonidine 0.1 mg BID -Changed home propranolol to labetolol 300 BID -On amlodipine 10mg  daily and Lasix 20mg  PO daily -Hydralazine also an option if needed -No ACE/ARB -Monitor to avoid dropping blood pressure too low   Anemia: - Last Hb 7.1, TSat 4 -  Ferrous sulfate 325 mg TID -  Giving Feraheme 510 IV X 2 doses, spaced a week apart (received 1st dose 8/2, second should be 8/9) -  Add Aranesp 100 mcg/week  -  Monitor for signs of bleeding, transfuse PRBCs as needed  Gout: Right knee flare this admission s/p aspiration and Depomedrol injection with relief.  Colchicine was added, but now discontinued in light of increased SCr and LFTs. - holding Colchicine  Elevated LFTs: AST/ALT jumped to 285/103 this AM from normal range.  Possibly related to Colchicine.  Abdominal U/S in May 2016 showed nodular appearance compatible with hepatic cirrhosis.  Has reported history of EtOH abuse.  -  LFTs trending down. -  EtOH cessation.  T2DM: Hgb A1c 4.5,  diet-controlled.  10/9  12:23 PM IMTS PGY-2  I have seen and examined this patient and agree with plan and assessment in the above note with renal recommendations/intervention  highlighted.  No labs done today, will check renal pan tomorrow to followup on renal function, bicarb. Addig weekly aranesp for anemia as we correct Fe def.    Wesley Burns B,MD 07/06/2016 1:26 PM

## 2016-07-06 NOTE — Plan of Care (Signed)
Problem: RH BLADDER ELIMINATION Goal: RH STG MANAGE BLADDER WITH ASSISTANCE STG Manage Bladder With min Assistance   Outcome: Not Progressing Condom cath in use per patient's preference   

## 2016-07-07 ENCOUNTER — Inpatient Hospital Stay (HOSPITAL_COMMUNITY): Payer: 59 | Admitting: Occupational Therapy

## 2016-07-07 DIAGNOSIS — G479 Sleep disorder, unspecified: Secondary | ICD-10-CM

## 2016-07-07 LAB — CULTURE, BODY FLUID W GRAM STAIN -BOTTLE: Culture: NO GROWTH

## 2016-07-07 LAB — RENAL FUNCTION PANEL
ANION GAP: 8 (ref 5–15)
Albumin: 1.7 g/dL — ABNORMAL LOW (ref 3.5–5.0)
BUN: 50 mg/dL — ABNORMAL HIGH (ref 6–20)
CALCIUM: 8.3 mg/dL — AB (ref 8.9–10.3)
CHLORIDE: 110 mmol/L (ref 101–111)
CO2: 14 mmol/L — AB (ref 22–32)
Creatinine, Ser: 4.34 mg/dL — ABNORMAL HIGH (ref 0.61–1.24)
GFR calc non Af Amer: 14 mL/min — ABNORMAL LOW (ref >60)
GFR, EST AFRICAN AMERICAN: 17 mL/min — AB (ref >60)
GLUCOSE: 106 mg/dL — AB (ref 65–99)
POTASSIUM: 5 mmol/L (ref 3.5–5.1)
Phosphorus: 3.8 mg/dL (ref 2.5–4.6)
Sodium: 132 mmol/L — ABNORMAL LOW (ref 135–145)

## 2016-07-07 LAB — GLUCOSE, CAPILLARY
GLUCOSE-CAPILLARY: 104 mg/dL — AB (ref 65–99)
GLUCOSE-CAPILLARY: 122 mg/dL — AB (ref 65–99)
GLUCOSE-CAPILLARY: 135 mg/dL — AB (ref 65–99)
Glucose-Capillary: 157 mg/dL — ABNORMAL HIGH (ref 65–99)

## 2016-07-07 LAB — CULTURE, BODY FLUID-BOTTLE

## 2016-07-07 MED ORDER — HYDRALAZINE HCL 25 MG PO TABS
25.0000 mg | ORAL_TABLET | Freq: Three times a day (TID) | ORAL | Status: DC
Start: 2016-07-07 — End: 2016-07-15
  Administered 2016-07-07 – 2016-07-14 (×23): 25 mg via ORAL
  Filled 2016-07-07 (×23): qty 1

## 2016-07-07 MED ORDER — SODIUM BICARBONATE 650 MG PO TABS
1300.0000 mg | ORAL_TABLET | Freq: Three times a day (TID) | ORAL | Status: DC
  Administered 2016-07-07 – 2016-07-09 (×6): 1300 mg via ORAL
  Filled 2016-07-07 (×6): qty 2

## 2016-07-07 MED ORDER — TRAZODONE HCL 50 MG PO TABS
50.0000 mg | ORAL_TABLET | Freq: Every evening | ORAL | Status: DC | PRN
  Administered 2016-07-07 – 2016-07-18 (×5): 50 mg via ORAL
  Filled 2016-07-07 (×5): qty 1

## 2016-07-07 MED ORDER — SODIUM CHLORIDE 0.9 % IV SOLN
510.0000 mg | Freq: Once | INTRAVENOUS | Status: AC
  Administered 2016-07-10: 510 mg via INTRAVENOUS
  Filled 2016-07-07 (×2): qty 17

## 2016-07-07 NOTE — Progress Notes (Signed)
Occupational Therapy Session Note  Patient Details  Name: Wesley Burns MRN: [id number] Date of Birth: 03-16-1966  Today's Date: 07/07/2016 OT Individual Time: 1259-1359 OT Individual Time Calculation (min): 60 min     Short Term Goals: Week 1:  OT Short Term Goal 1 (Week 1): Pt will complete LB ADLs with Mod A with use of AE OT Short Term Goal 2 (Week 1): Pt will complete toilet transfers with Min A OT Short Term Goal 3 (Week 1): Pt will engage in 5 minutes of therapuetic activity without rest breaks to increase activity tolerance for self care   Skilled Therapeutic Interventions/Progress Updates:   Pt was seen for afternoon session lying in bed after lunch. Pt reported still feeling very tired and wanting to stay in bed. Pt required max encouragement to get out of bed in order to use bathroom. Bed mobility completed with Mod A, and squat pivot transfer from bed to w/c completed with Mod A. Pt transferred to toilet with Mod A sit to stand and steadying assistance for transfer. Pt requires max instruction on proper hand placement while standing. Pt able to complete clothing mgt and pericare with steadying assistance. After toileting and handwashing, pt was agreeable to change shirt and don Teds/shoes. Pt reported not wanting to wash today. Pt able to don both shoes today with adaptive footstool while seated in w/c. Cues needed for breathing out upon exertion. Sit to stand orthostatics taken during session with pt exhibiting 130/70 BP while seated and 119/70 when standing. Pt stood for two minutes at sink with unilateral support. No c/o dizziness or nausea; nursing notified regarding BP change. Pt was educated on benefits of staying up in w/c instead of lying in bed with verbalized understanding.  Pt was agreeable to sit up and call on nursing when wanting to return to bed. At end of session pt was left in w/c with all needs within reach.   Therapy Documentation Precautions:   Precautions Precautions: Fall Precaution Comments: h/o R foot amputation, L charcot foot, bilat THA, falls Restrictions Weight Bearing Restrictions: No General:   Vital Signs: Therapy Vitals Temp: 98.1 F (36.7 C) Temp Source: Oral Resp: 18 Oxygen Therapy SpO2: 98 % O2 Device: Not Delivered Pain: No c/o pain; pt reports fatigue    ADL: ADL Upper Body Bathing: Supervision/safety Where Assessed-Upper Body Bathing: Sitting at sink Lower Body Bathing: Moderate assistance Where Assessed-Lower Body Bathing: Sitting at sink Upper Body Dressing: Supervision/safety Where Assessed-Upper Body Dressing: Sitting at sink Lower Body Dressing: Maximal assistance Where Assessed-Lower Body Dressing: Sitting at sink Toileting: Minimal assistance Where Assessed-Toileting: Teacher, adult education: Moderate assistance Toilet Transfer Method: Stand pivot Toilet Transfer Equipment: Grab bars, Raised toilet seat     See Function Navigator for Current Functional Status.   Therapy/Group: Individual Therapy  Ondra Deboard A Tenee Wish 07/07/2016, 3:47 PM
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
## 2016-07-07 NOTE — Plan of Care (Signed)
Problem: RH BLADDER ELIMINATION Goal: RH STG MANAGE BLADDER WITH ASSISTANCE STG Manage Bladder With min Assistance   Outcome: Not Progressing Condom cath in use per patient's preference

## 2016-07-07 NOTE — Progress Notes (Signed)
[facility] Progress Note  Subjective/Interval Events:  Patient seen and examined this morning. No new complaints.  Plans to work with therapy later today.  Objective:  I/O last 3 completed shifts: In: 1200 [P.O.:1200] Out: 1775 [Urine:1775] No intake/output data recorded.   Physical Examination: BP (!) 164/81 (BP Location: Right Arm)   Pulse 78   Temp 98.2 F (36.8 C) (Oral)   Resp 18   Ht 5\' 11"  (1.803 m)   Wt 192 lb 4.8 oz (87.2 kg)   SpO2 97%   BMI 26.82 kg/m   General: resting in bed, comfortable, no acute distress Cardiac: RRR, no rubs, murmurs or gallops Pulm: normal effort Abd: soft, nontender, nondistended Ext: warm and well perfused, trace pedal edema   Scheduled Medications: . amLODipine  10 mg Oral Daily  . calcitRIOL  0.25 mcg Oral Daily  . cloNIDine  0.1 mg Oral BID  . [START ON 07/08/2016] darbepoetin (ARANESP) injection - NON-DIALYSIS  100 mcg Subcutaneous Q Mon-1800  . ferrous sulfate  325 mg Oral TID WC  . furosemide  20 mg Oral Daily  . heparin  5,000 Units Subcutaneous Q8H  . insulin aspart  0-9 Units Subcutaneous TID WC  . labetalol  300 mg Oral TID  . magnesium oxide  400 mg Oral Daily  . pantoprazole  40 mg Oral Daily  . sodium bicarbonate  650 mg Oral TID   PRN Meds:.acetaminophen **OR** acetaminophen, methocarbamol, ondansetron **OR** ondansetron (ZOFRAN) IV, sorbitol, traZODone   Recent Labs Lab 07/01/16 0536  07/04/16 0538 07/05/16 0538 07/05/16 1120 07/07/16 0644  NA 130*  < > 131* 131*  --  132*  K 3.6  < > 4.7 4.8  --  5.0  CL 106  < > 110 109  --  110  CO2 16*  < > 13* 14*  --  14*  GLUCOSE 80  < > 149* 123*  --  106*  BUN 22*  < > 39* 47*  --  50*  CREATININE 3.84*  < > 4.15* 4.24*  --  4.34*  CALCIUM 7.5*  < > 7.3* 7.6*  --  8.3*  MG 1.1*  --   --   --   --   --   PHOS  --   --  3.1  --  3.1 3.8  < > = values in this interval not displayed.   Recent Labs Lab 07/03/16 0510 07/04/16 0538 07/05/16 0538  07/07/16 0644  AST 285* 184* 76*  --   ALT 103* 127* 99*  --   ALKPHOS 238* 221* 211*  --   BILITOT 1.1 0.6 0.5  --   PROT 4.6* 4.6* 4.6*  --   ALBUMIN 1.6* 1.7* 1.7* 1.7*     Recent Labs Lab 07/01/16 0536 07/02/16 0544 07/03/16 0510 07/04/16 0538 07/05/16 0538  WBC 9.0 8.9 9.8 6.1 5.5  NEUTROABS  --   --   --   --  3.8  HGB 7.0* 8.0* 7.2* 7.6* 7.1*  HCT 21.3* 24.4* 21.0* 22.8* 21.4*  MCV 91.4 90.0 89.0 91.2 89.9  PLT 218 255 295 310 370      Recent Labs Lab 07/06/16 0655 07/06/16 1201 07/06/16 1701 07/06/16 2044 07/07/16 0649  GLUCAP 120* 130* 120* 133* 104*   Results for SIDDIQ, KALUZNY (MRN [name]) as of 07/06/2016 13:21  Ref. 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 He required Nicardipine drip for hypertensive emergency (off drip on 7/31). We were asked to see because of worsening renal function, creatinine increase from 3.78->4.23. 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 Has had proteinuria longstanding, 2.8 grams 08/2013  2/2 diabetic/hypertensive nephropathy; normal complements this admission;  neg HIV, Hep B, Hep C, ANCA , kappa/lambda ratio 04/2015, repeat urine study: 0-5 rbcs, >300 protein, Pro/Cr 7.78. Normal size kidneys 12.6, 12.3 cm 04/2015).  - creatinine slightly worse today to 4.34 from 4.24 from 2 days ago - UOP recorded yesterday 1.7 liters - Discontinued ARB with this very low GFR and should not be restarted -  Cannot pursue permanent  vascular access yet with recent stroke -  sodium bicarbonate 650 mg TID --> bicarb today 14.   Increase dose to 2 tabs tid -  Dr. Eliott Nine plans to see him in the office after his discharge from rehab  Secondary hyperparathyroidism -  PTH 128 --> added Calcitriol 0.25 mcg daily -  Phos 3.8  HTN: Hypertensive crisis requiring Nicardipine drip on admission (off drip since 7/31). BPs elevated, some medication changes made were lost on transfer to CIR. Have been added back. -Continue Clonidine 0.1 mg BID -Changed home propranolol to labetolol 300 BID -On amlodipine 10mg  daily and Lasix 20mg  PO daily -No ACE/ARB - BP has continued to be elevated, add hydralazine 25 mg tid with continuing to monitor closely to avoid dropping blood pressure too low - Check orthostatics - I note that the single sitting BP recorded on flow sheets is lower than all the other supine pressures.   -Lasix dose low but good UOP yesterday. (minimal edema now but may have to increase given increased Na load with the incr dose of Na bicarb).  Anemia: - Last Hb 7.1, TSat 4 -  Ferrous sulfate 325 mg TID -  Giving Feraheme 510 IV X 2 doses, spaced a week apart (received 1st dose 8/2, second should be 8/9) -  Add Aranesp 100 mcg/week  -  Monitor for signs of bleeding, transfuse PRBCs as needed  Gout: Right knee flare this admission s/p aspiration and Depomedrol injection with relief.  Colchicine was added, but now discontinued in light of increased SCr and LFTs. - holding Colchicine  Elevated LFTs: AST/ALT jumped to 285/103 on 8/2 from normal range.  Possibly related to Colchicine.  Abdominal U/S in May 2016 showed nodular appearance compatible with hepatic cirrhosis.  Has reported history of EtOH abuse.  -  LFTs trending down. -  EtOH cessation.  Hx T2DM: Hgb A1c 4.5, diet-controlled.  10/9  9:18 AM IMTS PGY-2  I have seen and examined this patient and agree with plan and assessment in the above note  with renal recommendations/intervention highlighted. Increasing Na bicarb for metabolic acidosis, adding hydralazine and checking orthostatic BP's. To start Aranesp tomorrow and should get 2nd dose of Feraheme on 8/9 (this somehow did not carry over on transfer to rehab).   Zakar Brosch B,MD 07/07/2016 11:29 AM

## 2016-07-08 ENCOUNTER — Inpatient Hospital Stay (HOSPITAL_COMMUNITY): Payer: 59 | Admitting: Speech Pathology

## 2016-07-08 ENCOUNTER — Inpatient Hospital Stay (HOSPITAL_COMMUNITY): Payer: 59 | Admitting: Physical Therapy

## 2016-07-08 ENCOUNTER — Inpatient Hospital Stay (HOSPITAL_COMMUNITY): Payer: 59 | Admitting: Occupational Therapy

## 2016-07-08 LAB — GLUCOSE, CAPILLARY
GLUCOSE-CAPILLARY: 144 mg/dL — AB (ref 65–99)
Glucose-Capillary: 102 mg/dL — ABNORMAL HIGH (ref 65–99)
Glucose-Capillary: 134 mg/dL — ABNORMAL HIGH (ref 65–99)
Glucose-Capillary: 135 mg/dL — ABNORMAL HIGH (ref 65–99)

## 2016-07-08 LAB — RENAL FUNCTION PANEL
Albumin: 1.8 g/dL — ABNORMAL LOW (ref 3.5–5.0)
Anion gap: 8 (ref 5–15)
BUN: 51 mg/dL — AB (ref 6–20)
CALCIUM: 8.9 mg/dL (ref 8.9–10.3)
CHLORIDE: 110 mmol/L (ref 101–111)
CO2: 15 mmol/L — AB (ref 22–32)
CREATININE: 4.6 mg/dL — AB (ref 0.61–1.24)
GFR calc non Af Amer: 13 mL/min — ABNORMAL LOW (ref >60)
GFR, EST AFRICAN AMERICAN: 16 mL/min — AB (ref >60)
Glucose, Bld: 96 mg/dL (ref 65–99)
Phosphorus: 4.5 mg/dL (ref 2.5–4.6)
Potassium: 4.9 mmol/L (ref 3.5–5.1)
SODIUM: 133 mmol/L — AB (ref 135–145)

## 2016-07-08 NOTE — Plan of Care (Signed)
Problem: RH BLADDER ELIMINATION Goal: RH STG MANAGE BLADDER WITH ASSISTANCE STG Manage Bladder With min Assistance   Outcome: Not Progressing Patient requests condom cath Goal: RH STG MANAGE BLADDER WITH EQUIPMENT WITH ASSISTANCE STG Manage Bladder With Equipment With min Assistance   Outcome: Not Progressing Patient requests condom cath

## 2016-07-08 NOTE — Progress Notes (Signed)
Physical Therapy Session Note  Patient Details  Name: Wesley Burns MRN: [id number] Date of Birth: [birth year]  Today's Date: 07/08/2016 PT Individual Time: 1415-1515 PT Individual Time Calculation (min): 60 min    Short Term Goals: Week 1:  PT Short Term Goal 1 (Week 1): Pt will perform bed mobility on flat bed with min A PT Short Term Goal 2 (Week 1): Pt will perform bed <> chair transfers consistently with min A PT Short Term Goal 3 (Week 1): Pt will perform w/c mobility x 150' with min A for UE strengthening/endurance PT Short Term Goal 4 (Week 1): Pt will negotiate 4 steps with 2 rails and min A PT Short Term Goal 5 (Week 1): Pt will ambulate x 150' with LRAD and min A  Skilled Therapeutic Interventions/Progress Updates:    Pt received resting in w/c, no c/o pain but reports extreme fatigue, agreeable to therapy session.  Session focus on activity tolerance, transfers, and LE strengthening.  PT provided extensive education throughout session regarding activity tolerance and fatigue when recovering from a brain injury.    Pt propels w/c to and from therapy gym with 2 short rest breaks and supervision.  Pt uses BUEs to propel.    Stand/pivot w/c<>standard chair with RW and mod assist to rise.    PT adjusted brake on L drive wheel of w/c to improve grip when locked.    PT instructs pt in BLE therex with 2# ankle weight for LAQ and hip flexion x10 reps, ball squeezes x10, and hip abduction against level 1 theraband x10 reps.   Pt returned to room at end of session.  Pt transferred back to bed with mod assist and sit>supine with min assist for LLE.  Pt positioned in semi-reclined position with call bell in reach and needs met.  Missed 15 minutes of therapy session due to fatigue.   Therapy Documentation Precautions:  Precautions Precautions: Fall Precaution Comments: h/o R foot amputation, L charcot foot, bilat THA, falls Restrictions Weight Bearing Restrictions: No  See  Function Navigator for Current Functional Status.   Therapy/Group: Individual Therapy  Earnest Conroy Penven-Crew 07/08/2016, 2:58 PM

## 2016-07-08 NOTE — Progress Notes (Signed)
Speech Language Pathology Daily Session Note  Patient Details  Name: JOSELUIS ALESSIO MRN: [id number] Date of Birth: Oct 28, 1966  Today's Date: 07/08/2016 SLP Individual Time: 0830-0930 SLP Individual Time Calculation (min): 60 min   Short Term Goals: Week 1: SLP Short Term Goal 1 (Week 1): Pt will selectively attend to functional tasks in a mildly distracting environment for 10 minutes with supervision verbal cues for redirection.  SLP Short Term Goal 2 (Week 1): Pt will complete familiar functional tasks with supervision verbal cues for functional problem solving.   SLP Short Term Goal 3 (Week 1): Pt will recognize and correct errors in the moment during familiar, functional tasks with supervision verbal cues.   SLP Short Term Goal 4 (Week 1): Pt will utilize external aids to recall daily information with supervision verbal cues.   SLP Short Term Goal 5 (Week 1): Pt will identify 1 physical and 1 cognitive deficit with supervision verbal cues.  Skilled Therapeutic Interventions: Skilled treatment session focused on cognitive goals. SLP facilitated session by providing Mod A question cues for patient to identify 1 physical and 1 cognitive impairment since hospitalization. SLP also provided total A for recall of his current medications and their functions. However, patient initiated organizing a 4 time per day pill box with supervision question cues for problem solving and Mod I for selective attention to task for ~15 minutes in a mildly distracting environment. Patient left supine in bed with all needs within reach. Continue with current plan of care.   Function:   Cognition Comprehension Comprehension assist level: Understands complex 90% of the time/cues 10% of the time  Expression   Expression assist level: Expresses complex 90% of the time/cues < 10% of the time  Social Interaction Social Interaction assist level: Interacts appropriately 75 - 89% of the time - Needs redirection for  appropriate language or to initiate interaction.  Problem Solving Problem solving assist level: Solves complex 90% of the time/cues < 10% of the time  Memory Memory assist level: Recognizes or recalls 50 - 74% of the time/requires cueing 25 - 49% of the time    Pain No/Denies Pain   Therapy/Group: Individual Therapy  Algernon Mundie 07/08/2016, 11:55 AM
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
## 2016-07-08 NOTE — Progress Notes (Signed)
Occupational Therapy Session Note  Patient Details  Name: Wesley Burns MRN: [id number] Date of Birth: September 30, 1966  Today's Date: 07/08/2016 OT Individual Time: 4696-2952 OT Individual Time Calculation (min): 62 min     Short Term Goals: Week 1:  OT Short Term Goal 1 (Week 1): Pt will complete LB ADLs with Mod A with use of AE OT Short Term Goal 2 (Week 1): Pt will complete toilet transfers with Min A OT Short Term Goal 3 (Week 1): Pt will engage in 5 minutes of therapuetic activity without rest breaks to increase activity tolerance for self care   Skilled Therapeutic Interventions/Progress Updates:   Pt was lying supine in bed upon skilled OT arrival. Pt reported feeling stiff and lethargic and required max encouragement to get out of bed for tx. Pt educated on benefits of sitting up in w/c with verbalized understanding. Pt was agreeable to get out of bed and sit up in w/c. Pt donned shoes at EOB with extra time and instruction on adaptive technique with use of footstool. Pt completed functional transfer from bed to w/c with Mod A. Pt did not realize pants were wet from catheter leakage. Pt taken to bathroom to get cleaned up on toilet and change brief/pants. Nursing notified to change catheter on 2 occasions due to catheter falling off. Pt provided extensive education on benefits of removing catheter to improve continency with pt still wanting to have the catheter. Nursing educated pt about this as well. Pt completed LB dressing with Mod A for L LE and threading catheter through pants. Pt required extra time and was able to assist with threading LEs without AE. Pericare completed while standing with steadying assistance prior to pulling pants over hips.. After toileting, pt washed hands at sink with supervision w/c level. Pt left up in w/c with lunch and all needs within reach at end of session. Pt provided max encouragement to sit up in w/c today instead of going back to bed with verbalized  understanding.   Therapy Documentation Precautions:  Precautions Precautions: Fall Precaution Comments: h/o R foot amputation, L charcot foot, bilat THA, falls Restrictions Weight Bearing Restrictions: No    Pain: No c/o pain     ADL Upper Body Bathing: Supervision/safety Where Assessed-Upper Body Bathing: Sitting at sink Lower Body Bathing: Moderate assistance Where Assessed-Lower Body Bathing: Sitting at sink Upper Body Dressing: Supervision/safety Where Assessed-Upper Body Dressing: Sitting at sink Lower Body Dressing: Maximal assistance Where Assessed-Lower Body Dressing: Sitting at sink Toileting: Minimal assistance Where Assessed-Toileting: Teacher, adult education: Moderate assistance Toilet Transfer Method: Stand pivot Toilet Transfer Equipment: Grab bars, Raised toilet seat      See Function Navigator for Current Functional Status.   Therapy/Group: Individual Therapy  Liana Camerer A Kyriakos Babler 07/08/2016, 1:12 PM

## 2016-07-08 NOTE — Progress Notes (Signed)
[facility] Progress Note  Subjective/Interval Events: Feels okay today, a little sore all over. States he is eating/drinking without trouble.  Objective:  I/O last 3 completed shifts: In: 720 [P.O.:720] Out: 975 [Urine:975] Total I/O In: 480 [P.O.:480] Out: -    Physical Examination: BP (!) 175/83 (BP Location: Right Arm)   Pulse 86   Temp 98.6 F (37 Burns) (Oral)   Resp 16   Ht 5\' 11"  (1.803 m)   Wt 193 lb 6.4 oz (87.7 kg)   SpO2 98%   BMI 26.97 kg/m   General: resting in a wheelchair, comfortable, no acute distress Cardiac: RRR, Pulm: clear to auscultation, normal effort Abd: soft, nontender, nondistended Ext: warm and well perfused, trace pedal edema   Scheduled Medications: . amLODipine  10 mg Oral Daily  . calcitRIOL  0.25 mcg Oral Daily  . cloNIDine  0.1 mg Oral BID  . darbepoetin (ARANESP) injection - NON-DIALYSIS  100 mcg Subcutaneous Q Mon-1800  . ferrous sulfate  325 mg Oral TID WC  . [START ON 07/10/2016] ferumoxytol  510 mg Intravenous Once  . furosemide  20 mg Oral Daily  . heparin  5,000 Units Subcutaneous Q8H  . hydrALAZINE  25 mg Oral Q8H  . insulin aspart  0-9 Units Subcutaneous TID WC  . labetalol  300 mg Oral TID  . magnesium oxide  400 mg Oral Daily  . pantoprazole  40 mg Oral Daily  . sodium bicarbonate  1,300 mg Oral TID   PRN Meds:.acetaminophen **OR** acetaminophen, methocarbamol, ondansetron **OR** ondansetron (ZOFRAN) IV, sorbitol, traZODone   Recent Labs Lab 07/05/16 0538 07/05/16 1120 07/07/16 0644 07/08/16 0537  NA 131*  --  132* 133*  K 4.8  --  5.0 4.9  CL 109  --  110 110  CO2 14*  --  14* 15*  GLUCOSE 123*  --  106* 96  BUN 47*  --  50* 51*  CREATININE 4.24*  --  4.34* 4.60*  CALCIUM 7.6*  --  8.3* 8.9  PHOS  --  3.1 3.8 4.5     Recent Labs Lab 07/03/16 0510 07/04/16 0538 07/05/16 0538 07/07/16 0644 07/08/16 0537  AST 285* 184* 76*  --   --   ALT 103* 127* 99*  --   --   ALKPHOS 238* 221* 211*  --   --    BILITOT 1.1 0.6 0.5  --   --   PROT 4.6* 4.6* 4.6*  --   --   ALBUMIN 1.6* 1.7* 1.7* 1.7* 1.8*     Recent Labs Lab 07/02/16 0544 07/03/16 0510 07/04/16 0538 07/05/16 0538  WBC 8.9 9.8 6.1 5.5  NEUTROABS  --   --   --  3.8  HGB 8.0* 7.2* 7.6* 7.1*  HCT 24.4* 21.0* 22.8* 21.4*  MCV 90.0 89.0 91.2 89.9  PLT 255 295 310 370      Recent Labs Lab 07/07/16 1140 07/07/16 1629 07/07/16 2100 07/08/16 0632 07/08/16 1220  GLUCAP 157* 122* 135* 102* 144*   Results for Wesley Burns, Wesley Burns (MRN [name]) as of 07/06/2016 13:21  Ref. 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 He required Nicardipine drip for hypertensive emergency (off drip on 7/31). We were asked to see because of worsening renal function, creatinine increase from 3.78->4.23. 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 Has had proteinuria longstanding, 2.8 grams 08/2013  2/2 diabetic/hypertensive nephropathy; normal complements this admission;  neg HIV, Hep B, Hep Burns, ANCA , kappa/lambda ratio 04/2015, repeat urine study: 0-5 rbcs, >300 protein, Pro/Cr 7.78. Normal size kidneys 12.6, 12.3 cm 04/2015).  - creatinine slightly worse today to 4.60 from 4.34 yesterday - UOP recorded yesterday 250 ccs - Discontinued ARB with this very low GFR and should not be restarted -  Cannot pursue permanent vascular access yet with recent stroke -  sodium bicarbonate 1300 mg TID -  Dr. Eliott Nine plans to see him in the office after his  discharge from rehab  Secondary hyperparathyroidism -  PTH 128  -  Calcitriol 0.25 mcg daily -  Phos 4.5  HTN: Hypertensive crisis requiring Nicardipine drip on admission (off drip since 7/31). BPs elevated, some medication changes made were lost on transfer to CIR. Have been added back. -Continue Clonidine 0.1 mg BID -Labetolol 300 TID -On amlodipine 10mg  daily and Lasix 20mg  PO daily -No ACE/ARB -Hydralazine 25 mg tid -Lasix dose (minimal edema now but may have to increase given increased Na load with the incr dose of Na bicarb).  Anemia: - Last Hb 7.1, TSat 4 -  Ferrous sulfate 325 mg TID -  Giving Feraheme 510 IV X 2 doses, spaced a week apart (received 1st dose 8/2, second should be 8/9) -  Aranesp 100 mcg/week   Gout: Right knee flare this admission s/p aspiration and Depomedrol injection with relief.  Colchicine was added, but now discontinued in light of increased SCr and LFTs. - holding Colchicine  Elevated LFTs: AST/ALT jumped to 285/103 on 8/2 from normal range.  Possibly related to Colchicine.  Abdominal U/S in May 2016 showed nodular appearance compatible with hepatic cirrhosis.  Has reported history of EtOH abuse.  -  LFTs trending down. -  EtOH cessation.  Hx T2DM: Hgb A1c 4.5, diet-controlled.  Vishal Patel  1:45 PM IMTS PGY-2  Renal Attending: I agree with note above.  I am concerned about slow rise in creatinine which just may reflect lack of autoregulation, but  Time of onset may be delayed.   Wesley Burns

## 2016-07-09 ENCOUNTER — Inpatient Hospital Stay (HOSPITAL_COMMUNITY): Payer: 59 | Admitting: Physical Therapy

## 2016-07-09 ENCOUNTER — Inpatient Hospital Stay (HOSPITAL_COMMUNITY): Payer: 59 | Admitting: Speech Pathology

## 2016-07-09 ENCOUNTER — Inpatient Hospital Stay (HOSPITAL_COMMUNITY): Payer: 59 | Admitting: Occupational Therapy

## 2016-07-09 DIAGNOSIS — E875 Hyperkalemia: Secondary | ICD-10-CM

## 2016-07-09 LAB — RENAL FUNCTION PANEL
ANION GAP: 10 (ref 5–15)
Albumin: 1.8 g/dL — ABNORMAL LOW (ref 3.5–5.0)
BUN: 52 mg/dL — ABNORMAL HIGH (ref 6–20)
CHLORIDE: 107 mmol/L (ref 101–111)
CO2: 16 mmol/L — ABNORMAL LOW (ref 22–32)
CREATININE: 4.67 mg/dL — AB (ref 0.61–1.24)
Calcium: 9 mg/dL (ref 8.9–10.3)
GFR, EST AFRICAN AMERICAN: 15 mL/min — AB (ref >60)
GFR, EST NON AFRICAN AMERICAN: 13 mL/min — AB (ref >60)
Glucose, Bld: 99 mg/dL (ref 65–99)
POTASSIUM: 5.3 mmol/L — AB (ref 3.5–5.1)
Phosphorus: 5 mg/dL — ABNORMAL HIGH (ref 2.5–4.6)
Sodium: 133 mmol/L — ABNORMAL LOW (ref 135–145)

## 2016-07-09 LAB — GLUCOSE, CAPILLARY
GLUCOSE-CAPILLARY: 113 mg/dL — AB (ref 65–99)
GLUCOSE-CAPILLARY: 138 mg/dL — AB (ref 65–99)
GLUCOSE-CAPILLARY: 128 mg/dL — AB (ref 65–99)
Glucose-Capillary: 117 mg/dL — ABNORMAL HIGH (ref 65–99)

## 2016-07-09 MED ORDER — SODIUM BICARBONATE 650 MG PO TABS
650.0000 mg | ORAL_TABLET | Freq: Three times a day (TID) | ORAL | Status: DC
  Administered 2016-07-09 – 2016-07-19 (×30): 650 mg via ORAL
  Filled 2016-07-09 (×30): qty 1

## 2016-07-09 MED ORDER — FUROSEMIDE 80 MG PO TABS
80.0000 mg | ORAL_TABLET | Freq: Two times a day (BID) | ORAL | Status: DC
  Administered 2016-07-09 – 2016-07-15 (×12): 80 mg via ORAL
  Filled 2016-07-09 (×12): qty 1
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
## 2016-07-09 NOTE — Progress Notes (Signed)
[facility] Progress Note  Subjective/Interval Events: Feeling tired this morning. Feels his thighs are swollen. States he is eating/drinking without issue.   Objective:  I/O last 3 completed shifts: In: 720 [P.O.:720] Out: -  Total I/O In: 240 [P.O.:240] Out: -    Physical Examination: BP (!) 167/78   Pulse 93   Temp 98.8 F (37.1 C) (Oral)   Resp 16   Ht 5\' 11"  (1.803 m)   Wt 185 lb (83.9 kg)   SpO2 98%   BMI 25.80 kg/m   General: resting in bed, comfortable, no acute distress Cardiac: RRR, systolic murmur heard at LUS border Pulm: clear to auscultation, normal effort Abd: soft, nontender, nondistended Ext: warm and well perfused, tender to touch bilateral thighs, pitting edema b/l LE and presacral edema   Scheduled Medications: . amLODipine  10 mg Oral Daily  . calcitRIOL  0.25 mcg Oral Daily  . cloNIDine  0.1 mg Oral BID  . darbepoetin (ARANESP) injection - NON-DIALYSIS  100 mcg Subcutaneous Q Mon-1800  . ferrous sulfate  325 mg Oral TID WC  . [START ON 07/10/2016] ferumoxytol  510 mg Intravenous Once  . furosemide  80 mg Oral BID  . heparin  5,000 Units Subcutaneous Q8H  . hydrALAZINE  25 mg Oral Q8H  . insulin aspart  0-9 Units Subcutaneous TID WC  . labetalol  300 mg Oral TID  . magnesium oxide  400 mg Oral Daily  . pantoprazole  40 mg Oral Daily  . sodium bicarbonate  650 mg Oral TID   PRN Meds:.acetaminophen **OR** acetaminophen, methocarbamol, ondansetron **OR** ondansetron (ZOFRAN) IV, sorbitol, traZODone   Recent Labs Lab 07/07/16 0644 07/08/16 0537 07/09/16 0712  NA 132* 133* 133*  K 5.0 4.9 5.3*  CL 110 110 107  CO2 14* 15* 16*  GLUCOSE 106* 96 99  BUN 50* 51* 52*  CREATININE 4.34* 4.60* 4.67*  CALCIUM 8.3* 8.9 9.0  PHOS 3.8 4.5 5.0*     Recent Labs Lab 07/03/16 0510 07/04/16 0538 07/05/16 0538 07/07/16 0644 07/08/16 0537 07/09/16 0712  AST 285* 184* 76*  --   --   --   ALT 103* 127* 99*  --   --   --   ALKPHOS 238*  221* 211*  --   --   --   BILITOT 1.1 0.6 0.5  --   --   --   PROT 4.6* 4.6* 4.6*  --   --   --   ALBUMIN 1.6* 1.7* 1.7* 1.7* 1.8* 1.8*     Recent Labs Lab 07/03/16 0510 07/04/16 0538 07/05/16 0538  WBC 9.8 6.1 5.5  NEUTROABS  --   --  3.8  HGB 7.2* 7.6* 7.1*  HCT 21.0* 22.8* 21.4*  MCV 89.0 91.2 89.9  PLT 295 310 370      Recent Labs Lab 07/08/16 1220 07/08/16 1623 07/08/16 2135 07/09/16 0645 07/09/16 1129  GLUCAP 144* 134* 135* 113* 138*   Results for CUINN, WESTERHOLD (MRN [name]) as of 07/06/2016 13:21  Ref. 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  He required Nicardipine drip for hypertensive emergency (off drip on 7/31). We were asked to see because of worsening renal function, creatinine increase from 3.78->4.23. 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 Has had proteinuria longstanding, 2.8 grams 08/2013  2/2 diabetic/hypertensive nephropathy; normal complements this admission;  neg HIV, Hep B, Hep C, ANCA , kappa/lambda ratio 04/2015, repeat urine study: 0-5 rbcs, >300 protein, Pro/Cr 7.78. Normal size kidneys 12.6, 12.3 cm 04/2015).  - creatinine stable at 4.67 from 4.60 yesterday - Discontinued ARB with this very low GFR and should not be restarted -  Cannot pursue permanent vascular access yet with recent stroke - will likely need dialysis in the near future -  Decrease Sodium bicarbonate to 650 mg TID -  Dr. Eliott Nine plans to see him in the office after his  discharge from rehab  - Will increase Lasix to 80 mg BID as patient has volume on board  Secondary hyperparathyroidism -  PTH 128  -  Calcitriol 0.25 mcg daily -  Phos 5.0  HTN: Hypertensive crisis requiring Nicardipine drip on admission (off drip since 7/31). BPs elevated, some medication changes made were lost on transfer to CIR. Have been added back. -Continue Clonidine 0.1 mg BID -Labetolol 300 TID -On amlodipine 10mg  daily and Lasix 20mg  PO daily -No ACE/ARB -Hydralazine 25 mg tid -Increase Lasix to 80 mg BID  Anemia: - Last Hb 7.1, TSat 4 -  Ferrous sulfate 325 mg TID -  Giving Feraheme 510 IV X 2 doses, spaced a week apart (received 1st dose 8/2, second should be 8/9) -  Aranesp 100 mcg/week   Gout: Right knee flare this admission s/p aspiration and Depomedrol injection with relief.  Colchicine was added, but now discontinued in light of increased SCr and LFTs. - holding Colchicine  Elevated LFTs: AST/ALT jumped to 285/103 on 8/2 from normal range.  Possibly related to Colchicine.  Abdominal U/S in May 2016 showed nodular appearance compatible with hepatic cirrhosis.  Has reported history of EtOH abuse.  -  LFTs trending down. -  EtOH cessation.  Hx T2DM: Hgb A1c 4.5, diet-controlled.  Vishal Patel  1:08 PM IMTS PGY-2  Renal Attending; Significant anasarca with associated hypoalbuminemia.  Will reduce sodium bicarbonate and increase diuretics.  I agree with note as articulated by Dr. 10/9 above.  Riku Buttery C

## 2016-07-09 NOTE — Progress Notes (Signed)
Speech Language Pathology Daily Session Note  Patient Details  Name: Wesley Burns MRN: [id number] Date of Birth: [birth year]  Today's Date: 07/09/2016 SLP Individual Time: 4332-9518 SLP Individual Time Calculation (min): 55 min   Short Term Goals: Week 1: SLP Short Term Goal 1 (Week 1): Pt will selectively attend to functional tasks in a mildly distracting environment for 10 minutes with supervision verbal cues for redirection.  SLP Short Term Goal 2 (Week 1): Pt will complete familiar functional tasks with supervision verbal cues for functional problem solving.   SLP Short Term Goal 3 (Week 1): Pt will recognize and correct errors in the moment during familiar, functional tasks with supervision verbal cues.   SLP Short Term Goal 4 (Week 1): Pt will utilize external aids to recall daily information with supervision verbal cues.   SLP Short Term Goal 5 (Week 1): Pt will identify 1 physical and 1 cognitive deficit with supervision verbal cues.  Skilled Therapeutic Interventions: Skilled treatment session focused on cognitive goals. SLP facilitated session by providing Min A verbal cues for sustained attention to medication management task due to fatigue. Patient completed organization of a 4 time per day pill box with overall Min A verbal cues for problem solving and to self-monitor and correct errors with task. SLP also facilitated conversation about the importance of having 24 hour supervision at discharge to maximize patient's safety and overall physical/cognitive activity. Patient verbalized understanding but will need reinforcement. Patient left supine in bed with all needs within reach. Continue with current plan of care.   Function:   Cognition Comprehension Comprehension assist level: Follows basic conversation/direction with extra time/assistive device  Expression   Expression assist level: Expresses basic needs/ideas: With extra time/assistive device  Social Interaction Social  Interaction assist level: Interacts appropriately 75 - 89% of the time - Needs redirection for appropriate language or to initiate interaction.  Problem Solving Problem solving assist level: Solves basic 90% of the time/requires cueing < 10% of the time  Memory Memory assist level: Recognizes or recalls 50 - 74% of the time/requires cueing 25 - 49% of the time    Pain Pain Assessment Pain Assessment: No/denies pain  Therapy/Group: Individual Therapy  Montoya Brandel 07/09/2016, 10:37 AM
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
## 2016-07-09 NOTE — Progress Notes (Signed)
Physical Therapy Session Note  Patient Details  Name: Wesley Burns MRN: [id number] Date of Birth: [birth year]  Today's Date: 07/09/2016 PT Individual Time: 1000-1100 PT Individual Time Calculation (min): 60 min    Short Term Goals:Week 1:  PT Short Term Goal 1 (Week 1): Pt will perform bed mobility on flat bed with min A PT Short Term Goal 2 (Week 1): Pt will perform bed <> chair transfers consistently with min A PT Short Term Goal 3 (Week 1): Pt will perform w/c mobility x 150' with min A for UE strengthening/endurance PT Short Term Goal 4 (Week 1): Pt will negotiate 4 steps with 2 rails and min A PT Short Term Goal 5 (Week 1): Pt will ambulate x 150' with LRAD and min A  Skilled Therapeutic Interventions/Progress Updates:  Handoff from previous therapist in hallway.  Pt continually states throughout session that he is "tired" or "exhausted" or "has no energy."  PT provided emotional support and further pt education on BI recovery.  Pt propelled to therapy gym with supervision for UE strengthening and activity tolerance.  Attempted to have pt don shoes, pt demos little initiation and poor problem solving stating that he couldn't perform the task.  When asked how he donned his shoes at home and suggesting he attempt to do the same here, he states "my legs just have no energy."  Shoes donned total assist for time management.  Stand/pivot from w/c<>nustep with RW and steady assist with verbal cues for forward gaze during sit<>stand and hand placement when sitting.  Nustep at level 1 for cardiopulmonary endurance, LE strengthening, and overall activity tolerance with pt self selecting rest breaks, maintaining BORG rating between 13-15.  Pt performed 4.5 minutes, 2.25 minutes, 3.5 minutes, and 1 minute with extended rest breaks in between each set.  Gait training x40' with RW and steady assist with verbal cues for forward gaze and foot clearance.  PT instructed pt in 5xSS (1 minute 15 seconds) with mod  assist to rise and verbal cues for weight shifting.  PT instructed pt in BLE therex x15 reps LAQ with 2 second hold, and hip flexion from posterior prop sitting.  PT provided verbal cues for eccentric control and breathing.  Pt performed stand/pivot back to w/c with steady assist and propelled w/c back to room supervision.  Pt left upright in w/c with call bell in reach and needs met.      Therapy Documentation Precautions:  Precautions Precautions: Fall Precaution Comments: h/o R foot amputation, L charcot foot, bilat THA, falls Restrictions Weight Bearing Restrictions: No   See Function Navigator for Current Functional Status.   Therapy/Group: Individual Therapy  Dione Mccombie E Penven-Crew 07/09/2016, 10:24 AM

## 2016-07-09 NOTE — Plan of Care (Signed)
Problem: RH Balance Goal: LTG: Patient will maintain dynamic sitting balance (OT) LTG:  Patient will maintain dynamic sitting balance with assistance during activities of daily living (OT)  Downgraded secondary to cognition and safety Goal: LTG Patient will maintain dynamic standing with ADLs (OT) LTG:  Patient will maintain dynamic standing balance with assist during activities of daily living (OT)   Downgraded secondary to cognition and safety  Problem: RH Dressing Goal: LTG Patient will perform upper body dressing (OT) LTG Patient will perform upper body dressing with assist, with/without cues (OT).  Downgraded secondary to cognition and safety  Problem: RH Toileting Goal: LTG Patient will perform toileting w/assist, cues/equip (OT) LTG: Patient will perform toiletiing (clothes management/hygiene) with assist, with/without cues using equipment (OT)  Downgraded secondary to cognition and safety  Problem: RH Simple Meal Prep Goal: LTG Patient will perform simple meal prep w/assist (OT) LTG: Patient will perform simple meal prep with assistance, with/without cues (OT).  Outcome: Not Applicable Date Met: 61/44/31 No current focus of OT intervention at this time. Goal discontinued

## 2016-07-09 NOTE — Progress Notes (Signed)
Occupational Therapy Session Note  Patient Details  Name: Wesley Burns MRN: [id number] Date of Birth: 28-Jun-1966  Today's Date: 07/09/2016 OT Individual Time: 4098-1191 and 4782-9562 OT Individual Time Calculation (min): 33 min and 58 minutes    Short Term Goals: Week 1:  OT Short Term Goal 1 (Week 1): Pt will complete LB ADLs with Mod A with use of AE OT Short Term Goal 2 (Week 1): Pt will complete toilet transfers with Min A OT Short Term Goal 3 (Week 1): Pt will engage in 5 minutes of therapuetic activity without rest breaks to increase activity tolerance for self care   Skilled Therapeutic Interventions/Progress Updates:     Session 1: Upon entering the room, pt seated in wheelchair and immediately requesting to return to bed. However, pt was agreeable to sitting in wheelchair with mirror to shave face with set up A. While pt is shaving, OT educating pt on OT purpose, POC, and goals, as well as expectations for inpatient rehab participation. Pt verbalized understanding. OT assisted pt back to bed at end of session with mod lifting assistance from wheelchair and pt performed stand pivot transfer with RW onto bed. Pt returned to supine with call bell and all needed items within reach. OT placing lunch tray in front of him and pt requesting therapist set up tray.   Session 2: Upon entering the room, pt supine in bed with RN giving medications. Pt with no c/o pain but reports, "I'm just so tired". Pt needing maximal encouragement for participation in OT intervention this session. Pt performed supine >sit with min A for L LE to EOB and verbal cues for proper technique and hand placement. Pt seated on EOB for bathing and dressing tasks this session. Pt needing set up A for UB self care to obtain items. Pt needing mod verbal cues throughout session for initiation and sequencing of tasks. Pt seated on edge of bed with close supervision for safety. Pt also utilizing L UE for functional tasks such as  squeezing wash cloth without cues needed.  OT educating pt again this session regarding removal of condom catheter and pt agreeable to removal. Pt standing from bed with mod lifting assistance. Pt needing steady assist for balance as he pushed pants down partially. Pt sitting and OT removing catheter and explaining pt need to use urinal as he will not be going home with catheter. Pt washing buttocks and peri area while standing with steady assistance for balance. Pt declined changing pants and pulled current pants back over B hips. Pt oriented this session x 4. However, pt with limited insight regarding current deficits. Pt stating goal as , "Doing what I was before." Pt returning to supine at end of session with mod A for B LEs. Bed alarm activated and call bell within reach upon exiting the room.   Therapy Documentation Precautions:  Precautions Precautions: Fall Precaution Comments: h/o R foot amputation, L charcot foot, bilat THA, falls Restrictions Weight Bearing Restrictions: No  Pain: Pain Assessment Pain Assessment: No/denies pain ADL: ADL Upper Body Bathing: Supervision/safety Where Assessed-Upper Body Bathing: Sitting at sink Lower Body Bathing: Moderate assistance Where Assessed-Lower Body Bathing: Sitting at sink Upper Body Dressing: Supervision/safety Where Assessed-Upper Body Dressing: Sitting at sink Lower Body Dressing: Maximal assistance Where Assessed-Lower Body Dressing: Sitting at sink Toileting: Minimal assistance Where Assessed-Toileting: Teacher, adult education: Moderate assistance Toilet Transfer Method: Stand pivot Toilet Transfer Equipment: Grab bars, Raised toilet seat  See Function Navigator for Current Functional Status.  Therapy/Group: Individual Therapy  Lowella Grip 07/09/2016, 12:55 PM

## 2016-07-09 NOTE — Progress Notes (Signed)
Physical Therapy Session Note  Patient Details  Name: Wesley Burns MRN: [id number] Date of Birth: [birth year]  Today's Date: 07/09/2016 PT Individual Time: 0947-1000 PT Individual Time Calculation (min): 13 min    Short Term Goals: Week 1:  PT Short Term Goal 1 (Week 1): Pt will perform bed mobility on flat bed with min A PT Short Term Goal 2 (Week 1): Pt will perform bed <> chair transfers consistently with min A PT Short Term Goal 3 (Week 1): Pt will perform w/c mobility x 150' with min A for UE strengthening/endurance PT Short Term Goal 4 (Week 1): Pt will negotiate 4 steps with 2 rails and min A PT Short Term Goal 5 (Week 1): Pt will ambulate x 150' with LRAD and min A  Skilled Therapeutic Interventions/Progress Updates:    Pt seen to make up missed time.  Pt received in bed & agreeable to PT, denying c/o pain but noting BLE stiffness & fatigue. Pt transferred supine>sitting with use of bed rails & supervision. While sitting on EOB therapist encouraged pt to don his socks but reports he cannot & did not don them himself at home; PT provided total A for donning socks. Educated pt on hand placement & pt performed sit>stand and stand pivot bed>w/c with RW & min A from elevated surface with PT managing catheter. Pt propelled w/c x 75 ft in controlled hallway with BUE & supervision for cardiovascular endurance training. At end of session pt left in handoff to other PT.  Therapy Documentation Precautions:  Precautions Precautions: Fall Precaution Comments: h/o R foot amputation, L charcot foot, bilat THA, falls Restrictions Weight Bearing Restrictions: No  Pain: Pain Assessment Pain Assessment: No/denies pain   See Function Navigator for Current Functional Status.   Therapy/Group: Individual Therapy  Sandi Mariscal 07/09/2016, 12:21 PM

## 2016-07-10 ENCOUNTER — Inpatient Hospital Stay (HOSPITAL_COMMUNITY): Payer: 59 | Admitting: Speech Pathology

## 2016-07-10 ENCOUNTER — Inpatient Hospital Stay (HOSPITAL_COMMUNITY): Payer: 59 | Admitting: Physical Therapy

## 2016-07-10 ENCOUNTER — Inpatient Hospital Stay (HOSPITAL_COMMUNITY): Payer: 59 | Admitting: Occupational Therapy

## 2016-07-10 DIAGNOSIS — I619 Nontraumatic intracerebral hemorrhage, unspecified: Secondary | ICD-10-CM

## 2016-07-10 LAB — CBC WITH DIFFERENTIAL/PLATELET
BASOS ABS: 0 10*3/uL (ref 0.0–0.1)
BASOS PCT: 0 %
EOS ABS: 0.3 10*3/uL (ref 0.0–0.7)
EOS PCT: 2 %
HCT: 19.5 % — ABNORMAL LOW (ref 39.0–52.0)
Hemoglobin: 6.5 g/dL — CL (ref 13.0–17.0)
Lymphocytes Relative: 17 %
Lymphs Abs: 1.7 10*3/uL (ref 0.7–4.0)
MCH: 30.4 pg (ref 26.0–34.0)
MCHC: 33.3 g/dL (ref 30.0–36.0)
MCV: 91.1 fL (ref 78.0–100.0)
MONO ABS: 1.1 10*3/uL — AB (ref 0.1–1.0)
MONOS PCT: 10 %
Neutro Abs: 7.4 10*3/uL (ref 1.7–7.7)
Neutrophils Relative %: 71 %
PLATELETS: 399 10*3/uL (ref 150–400)
RBC: 2.14 MIL/uL — ABNORMAL LOW (ref 4.22–5.81)
RDW: 16.2 % — ABNORMAL HIGH (ref 11.5–15.5)
WBC: 10.5 10*3/uL (ref 4.0–10.5)

## 2016-07-10 LAB — GLUCOSE, CAPILLARY
GLUCOSE-CAPILLARY: 104 mg/dL — AB (ref 65–99)
GLUCOSE-CAPILLARY: 111 mg/dL — AB (ref 65–99)
GLUCOSE-CAPILLARY: 115 mg/dL — AB (ref 65–99)
Glucose-Capillary: 111 mg/dL — ABNORMAL HIGH (ref 65–99)

## 2016-07-10 LAB — CBC
HEMATOCRIT: 20.1 % — AB (ref 39.0–52.0)
Hemoglobin: 6.7 g/dL — CL (ref 13.0–17.0)
MCH: 30.9 pg (ref 26.0–34.0)
MCHC: 33.3 g/dL (ref 30.0–36.0)
MCV: 92.6 fL (ref 78.0–100.0)
PLATELETS: 355 10*3/uL (ref 150–400)
RBC: 2.17 MIL/uL — AB (ref 4.22–5.81)
RDW: 16.1 % — ABNORMAL HIGH (ref 11.5–15.5)
WBC: 10.3 10*3/uL (ref 4.0–10.5)

## 2016-07-10 LAB — PREPARE RBC (CROSSMATCH)

## 2016-07-10 LAB — BASIC METABOLIC PANEL
ANION GAP: 9 (ref 5–15)
BUN: 54 mg/dL — ABNORMAL HIGH (ref 6–20)
CALCIUM: 9 mg/dL (ref 8.9–10.3)
CO2: 17 mmol/L — ABNORMAL LOW (ref 22–32)
CREATININE: 4.82 mg/dL — AB (ref 0.61–1.24)
Chloride: 105 mmol/L (ref 101–111)
GFR, EST AFRICAN AMERICAN: 15 mL/min — AB (ref >60)
GFR, EST NON AFRICAN AMERICAN: 13 mL/min — AB (ref >60)
GLUCOSE: 94 mg/dL (ref 65–99)
POTASSIUM: 4.9 mmol/L (ref 3.5–5.1)
Sodium: 131 mmol/L — ABNORMAL LOW (ref 135–145)

## 2016-07-10 MED ORDER — SODIUM CHLORIDE 0.9 % IV SOLN
Freq: Once | INTRAVENOUS | Status: AC
  Administered 2016-07-10: 13:00:00 via INTRAVENOUS

## 2016-07-10 NOTE — Progress Notes (Signed)
SLP Cancellation Note  Patient Details Name: Wesley Burns MRN: [id number] DOB: [birth year]   Cancelled treatment:       Patient missed 60 minutes of skilled SLP intervention due to fatigue despite encouragement from SLP. RN made aware. Continue with current plan of care.                                                                                                [name] 07/10/2016, 12:05 PM

## 2016-07-10 NOTE — Plan of Care (Signed)
Problem: RH SKIN INTEGRITY Goal: RH STG SKIN FREE OF INFECTION/BREAKDOWN Outcome: Progressing With mod. assist.     

## 2016-07-10 NOTE — Progress Notes (Signed)
Physical Therapy Note  Patient Details  Name: Wesley Burns MRN: [id number] Date of Birth: November 30, 1966 Today's Date: 07/10/2016    Attempted to see pt for scheduled PM therapy session.  Pt had returned to bed from earlier OT session and had just begun a blood transfusion and RN states he is also getting an iron transfusion with close monitoring.  Left pt supine with RN present.  Will continue per POC as able.   [name] 07/10/2016, 4:00 PM

## 2016-07-10 NOTE — Progress Notes (Signed)
Occupational Therapy Session Note  Patient Details  Name: Wesley Burns MRN: [id number] Date of Birth: 03-17-1966  Today's Date: 07/10/2016 OT Individual Time: 1400-1505 OT Individual Time Calculation (min): 65 min     Short Term Goals:Week 1:  OT Short Term Goal 1 (Week 1): Pt will complete LB ADLs with Mod A with use of AE OT Short Term Goal 2 (Week 1): Pt will complete toilet transfers with Min A OT Short Term Goal 3 (Week 1): Pt will engage in 5 minutes of therapuetic activity without rest breaks to increase activity tolerance for self care   Skilled Therapeutic Interventions/Progress Updates:    Pt seen for OT ADL bathing/dressing session. Pt in supine upon arrival, voicing increased fatigue, however, with encouragement and increased time pt agreeable to attempt therapy session. With assist, he transferred to EOB, initially requiring assist for sitting balance EOB, however, progressed to supervision. He voiced need for using urinal, requiring assist to place urinal, pt unable to void. He completed stand pivot from elevated EOB with mod A and max VCs for sequencing. Bathing/dressing completed seated in w/c at sink. Focus on pt utilizing L UE during bathing tasks for gross and fine motor tasks.  VCs provided for hemi dressing techniques when donning/doffing clothing.  Pt with L lean in sitting, requiring verbal and visual cues for midline orientation. He completed sit <> stand at sink with max A and heavy steadying assist provided while pt completed buttock hygiene. Due to B LEs edema, pt unable to cross ankle over knee to wash LEs, requiring increased assist for LB bathing/dressing.  Grooming completed seated at sink with set-up.  Pt requesting need for toileting tasks. Required significantly increased time and encouragement to complete transfer. Max A for lift and lower assist required for stand pivot to toilet with max VCs for sequencing. Pt left sitting on toilet at end of session,  educated regarding pull cord in bathroom to call for nursing when finished. RN and NT made aware of pt's position.  Therapy Documentation Precautions:  Precautions Precautions: Fall Precaution Comments: h/o R foot amputation, L charcot foot, bilat THA, falls Restrictions Weight Bearing Restrictions: No Pain: No/ denies pain See Function Navigator for Current Functional Status.   Therapy/Group: Individual Therapy  [name], [name] 07/10/2016, 2:29 PM

## 2016-07-10 NOTE — Plan of Care (Signed)
Pt's plan of care adjusted to 15/7 after speaking with care team and discussed with MD in team conference as pt currently unable to tolerate current therapy schedule with OT, PT, and SLP.   Teodoro Kil, PT, DPT 07/10/16

## 2016-07-10 NOTE — Progress Notes (Signed)
CRITICAL VALUE ALERT  Critical value received:  Hgb 6.5  Date of notification:  07/10/2016  Time of notification:  0725  Critical value read back:Yes.    Nurse who received alert:  Dolly Rias   MD notified (1st page):  Dr. Allena Katz    Time of first page:  0725  MD notified (2nd page):  Time of second page:  Responding MD: Dr. Allena Katz   Time MD responded:  0725. No new orders at this time

## 2016-07-10 NOTE — Progress Notes (Signed)
[facility] Progress Note  Subjective/Interval Events: Eating breakfast this morning. UOP 700 ccs recorded yesterday Hgb 6.5 this AM, repeat CBC pending Denies obvious signs of bleeding, blood in stool/urine, headache, lightheadedness/dizziness, abdominal pain   Objective:  I/O last 3 completed shifts: In: 720 [P.O.:720] Out: 700 [Urine:700] Total I/O In: -  Out: 150 [Urine:150]   Physical Examination: BP (!) 155/74 (BP Location: Right Arm)   Pulse 85   Temp 98.9 F (37.2 C) (Oral)   Resp 18   Ht 5\' 11"  (1.803 m)   Wt 186 lb 1.6 oz (84.4 kg)   SpO2 96%   BMI 25.96 kg/m   General: resting in bed, comfortable, no acute distress Cardiac: RRR, systolic murmur heard at LUS border Pulm: clear to auscultation, normal effort Abd: soft, nontender, nondistended Ext: warm and well perfused, tender to touch bilateral thighs, pitting edema b/l LE at shins and thighs   Scheduled Medications: . amLODipine  10 mg Oral Daily  . calcitRIOL  0.25 mcg Oral Daily  . cloNIDine  0.1 mg Oral BID  . darbepoetin (ARANESP) injection - NON-DIALYSIS  100 mcg Subcutaneous Q Mon-1800  . ferrous sulfate  325 mg Oral TID WC  . ferumoxytol  510 mg Intravenous Once  . furosemide  80 mg Oral BID  . heparin  5,000 Units Subcutaneous Q8H  . hydrALAZINE  25 mg Oral Q8H  . insulin aspart  0-9 Units Subcutaneous TID WC  . labetalol  300 mg Oral TID  . magnesium oxide  400 mg Oral Daily  . pantoprazole  40 mg Oral Daily  . sodium bicarbonate  650 mg Oral TID   PRN Meds:.acetaminophen **OR** acetaminophen, methocarbamol, ondansetron **OR** ondansetron (ZOFRAN) IV, sorbitol, traZODone   Recent Labs Lab 07/07/16 0644 07/08/16 0537 07/09/16 0712 07/10/16 0623  NA 132* 133* 133* 131*  K 5.0 4.9 5.3* 4.9  CL 110 110 107 105  CO2 14* 15* 16* 17*  GLUCOSE 106* 96 99 94  BUN 50* 51* 52* 54*  CREATININE 4.34* 4.60* 4.67* 4.82*  CALCIUM 8.3* 8.9 9.0 9.0  PHOS 3.8 4.5 5.0*  --      Recent  Labs Lab 07/04/16 0538 07/05/16 0538 07/07/16 0644 07/08/16 0537 07/09/16 0712  AST 184* 76*  --   --   --   ALT 127* 99*  --   --   --   ALKPHOS 221* 211*  --   --   --   BILITOT 0.6 0.5  --   --   --   PROT 4.6* 4.6*  --   --   --   ALBUMIN 1.7* 1.7* 1.7* 1.8* 1.8*     Recent Labs Lab 07/04/16 0538 07/05/16 0538 07/10/16 0623  WBC 6.1 5.5 10.5  NEUTROABS  --  3.8 7.4  HGB 7.6* 7.1* 6.5*  HCT 22.8* 21.4* 19.5*  MCV 91.2 89.9 91.1  PLT 310 370 399      Recent Labs Lab 07/09/16 0645 07/09/16 1129 07/09/16 1652 07/09/16 2100 07/10/16 0647  GLUCAP 113* 138* 128* 117* 104*   Results for LAMAR, METER (MRN [name]) as of 07/06/2016 13:21  Ref. 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 He required Nicardipine drip for hypertensive emergency (off drip on 7/31). We were asked to see because of worsening renal function, creatinine increase from 3.78->4.23. 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 Has had proteinuria longstanding, 2.8 grams 08/2013  2/2 diabetic/hypertensive nephropathy; normal complements this admission;  neg HIV, Hep B, Hep C, ANCA , kappa/lambda ratio 04/2015, repeat urine study: 0-5 rbcs, >300 protein, Pro/Cr 7.78. Normal size kidneys 12.6, 12.3 cm 04/2015).  - creatinine up to 4.82 from 4.67 yesterday - Discontinued ARB with this very low GFR and should not be restarted -  Cannot pursue permanent vascular access yet with recent stroke - will likely  need dialysis in the near future -  Sodium bicarbonate 650 mg TID -  Dr. Eliott Nine plans to see him in the office after his discharge from rehab  - Continue Lasix 80 mg po BID for edema  Secondary hyperparathyroidism -  PTH 128  -  Calcitriol 0.25 mcg daily  HTN: Hypertensive crisis requiring Nicardipine drip on admission (off drip since 7/31). BPs elevated, some medication changes made were lost on transfer to CIR. Have been added back. -Continue Clonidine 0.1 mg BID -Labetolol 300 TID -amlodipine 10mg  daily -No ACE/ARB -Hydralazine 25 mg tid -Lasix 80 mg po BID  Anemia: - Hgb 6.5 this morning, no obvious sign of bleeding. Repeat CBC pending. -  Ferrous sulfate 325 mg TID -  Giving Feraheme 510 IV X 2 doses, spaced a week apart (received 1st dose 8/2, second dose scheduled today 8/9) -  Aranesp 100 mcg/week  -Repeat CBC pending -Monitor signs/symptoms of active bleeding -Type and screen ordered -Transfuse 1 unit PRBC if Hgb <7.0  Gout: Right knee flare this admission s/p aspiration and Depomedrol injection with relief.  Colchicine was added, but now discontinued in light of increased SCr and LFTs. - holding Colchicine  Elevated LFTs: AST/ALT jumped to 285/103 on 8/2 from normal range.  Possibly related to Colchicine.  Abdominal U/S in May 2016 showed nodular appearance compatible with hepatic cirrhosis.  Has reported history of EtOH abuse.  -  EtOH cessation.  Hx T2DM: Hgb A1c 4.5, diet-controlled.  Vishal Patel  8:18 AM IMTS PGY-2   Renal Attending: I agree with note above, as articulated by Dr June 2016.   Slowly rising sCr is of concern.  We will follow. Mase Dhondt C

## 2016-07-10 NOTE — Progress Notes (Signed)
Physical Therapy Session Note  Patient Details  Name: Wesley Burns MRN: [id number] Date of Birth: Dec 15, 1965  Today's Date: 07/10/2016 PT Individual Time: 2458-0998 PT Individual Time Calculation (min): 10 min  and Today's Date: 07/10/2016 PT Missed Time: 50 Minutes Missed Time Reason: Patient fatigue;Patient ill (Comment) (hgb 6.5)    Short Term Goals: Week 1:  PT Short Term Goal 1 (Week 1): Pt will perform bed mobility on flat bed with min A PT Short Term Goal 2 (Week 1): Pt will perform bed <> chair transfers consistently with min A PT Short Term Goal 3 (Week 1): Pt will perform w/c mobility x 150' with min A for UE strengthening/endurance PT Short Term Goal 4 (Week 1): Pt will negotiate 4 steps with 2 rails and min A PT Short Term Goal 5 (Week 1): Pt will ambulate x 150' with LRAD and min A  Skilled Therapeutic Interventions/Progress Updates:   \ Pt received in bed, again reporting extreme fatigue and being cold.  Pt lying with long sleeve shirt and jacket on in bed but no pants.  States they are dirty.  Pt declining therapy this AM.  PT provided education on PT plan, goals, and importance of participation, reminded pt of conversation yesterday about improving activity tolerance by sitting up throughout the day and not remaining in bed.  Pt did not verbalize anything in response to this discussion.  Attempted to provide pt with hospital gown to leave room, pt lies with gown draped over him for 5 minutes and when PT asks him to put the gown on he states, "i'm just too tired, I can't."  Left supine with bed alarm activated, call bell in reach and needs met.  Missed 50 minutes of skilled PT, RN aware.  Discussed with PA and pt made 15/7.    Therapy Documentation Precautions:  Precautions Precautions: Fall Precaution Comments: h/o R foot amputation, L charcot foot, bilat THA, falls Restrictions Weight Bearing Restrictions: No General: PT Amount of Missed Time (min): 50 Minutes PT  Missed Treatment Reason: Patient fatigue;Patient ill (Comment) (hgb 6.5)   See Function Navigator for Current Functional Status.   Therapy/Group: Individual Therapy  Mouna Yager E Penven-Crew 07/10/2016, 11:42 AM
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
## 2016-07-11 ENCOUNTER — Inpatient Hospital Stay (HOSPITAL_COMMUNITY): Payer: 59 | Admitting: Physical Therapy

## 2016-07-11 ENCOUNTER — Inpatient Hospital Stay (HOSPITAL_COMMUNITY): Payer: 59 | Admitting: Occupational Therapy

## 2016-07-11 ENCOUNTER — Inpatient Hospital Stay (HOSPITAL_COMMUNITY): Payer: 59 | Admitting: Speech Pathology

## 2016-07-11 ENCOUNTER — Encounter (HOSPITAL_COMMUNITY): Payer: 59

## 2016-07-11 DIAGNOSIS — F1010 Alcohol abuse, uncomplicated: Secondary | ICD-10-CM

## 2016-07-11 LAB — RENAL FUNCTION PANEL
ALBUMIN: 1.9 g/dL — AB (ref 3.5–5.0)
ANION GAP: 8 (ref 5–15)
BUN: 60 mg/dL — ABNORMAL HIGH (ref 6–20)
CALCIUM: 9 mg/dL (ref 8.9–10.3)
CHLORIDE: 106 mmol/L (ref 101–111)
CO2: 16 mmol/L — AB (ref 22–32)
Creatinine, Ser: 4.81 mg/dL — ABNORMAL HIGH (ref 0.61–1.24)
GFR, EST AFRICAN AMERICAN: 15 mL/min — AB (ref >60)
GFR, EST NON AFRICAN AMERICAN: 13 mL/min — AB (ref >60)
Glucose, Bld: 118 mg/dL — ABNORMAL HIGH (ref 65–99)
Phosphorus: 5.7 mg/dL — ABNORMAL HIGH (ref 2.5–4.6)
Potassium: 4.9 mmol/L (ref 3.5–5.1)
Sodium: 130 mmol/L — ABNORMAL LOW (ref 135–145)

## 2016-07-11 LAB — TYPE AND SCREEN
ABO/RH(D): A NEG
ANTIBODY SCREEN: NEGATIVE
UNIT DIVISION: 0

## 2016-07-11 LAB — CBC
HEMATOCRIT: 24 % — AB (ref 39.0–52.0)
Hemoglobin: 7.9 g/dL — ABNORMAL LOW (ref 13.0–17.0)
MCH: 29.7 pg (ref 26.0–34.0)
MCHC: 32.9 g/dL (ref 30.0–36.0)
MCV: 90.2 fL (ref 78.0–100.0)
PLATELETS: 371 10*3/uL (ref 150–400)
RBC: 2.66 MIL/uL — AB (ref 4.22–5.81)
RDW: 17.4 % — ABNORMAL HIGH (ref 11.5–15.5)
WBC: 9.3 10*3/uL (ref 4.0–10.5)

## 2016-07-11 LAB — GLUCOSE, CAPILLARY
GLUCOSE-CAPILLARY: 130 mg/dL — AB (ref 65–99)
GLUCOSE-CAPILLARY: 119 mg/dL — AB (ref 65–99)
Glucose-Capillary: 89 mg/dL (ref 65–99)

## 2016-07-11 LAB — OCCULT BLOOD X 1 CARD TO LAB, STOOL: Fecal Occult Bld: NEGATIVE

## 2016-07-11 MED ORDER — LIDOCAINE HCL 2 % EX GEL
1.0000 "application " | Freq: Once | CUTANEOUS | Status: DC
  Filled 2016-07-11: qty 5

## 2016-07-11 MED ORDER — LIDOCAINE HCL (CARDIAC) 20 MG/ML IV SOLN
INTRAVENOUS | Status: AC
  Filled 2016-07-11: qty 5

## 2016-07-11 MED ORDER — POLYETHYLENE GLYCOL 3350 17 G PO PACK
17.0000 g | PACK | Freq: Every day | ORAL | Status: DC | PRN
  Administered 2016-07-11: 17 g via ORAL
  Filled 2016-07-11: qty 1

## 2016-07-11 NOTE — Progress Notes (Signed)
Physical Therapy Note  Patient Details  Name: Wesley Burns MRN: [id number] Date of Birth: 30-Aug-1966 Today's Date: 07/11/2016   PT arrived for scheduled therapy session at 1430, pt reporting ongoing extreme fatigue and abdominal discomfort from constipation (RN reports pt had a bowel movement yesterday) and declining therapy.  PT again providing education regarding pt's goals for d/c home with supervision and pt's need to participate in order to reach those goals.  Pt states, "I don't want to get in trouble, but I just don't feel well I'm so tired and my belly is hurting."  Pt left supine in bed with call bell in reach and needs met.  RN and SW notified of ongoing challenges with pt participation.    [name] 07/11/2016, 4:35 PM

## 2016-07-11 NOTE — Progress Notes (Signed)
Social Work Patient ID: Wesley Burns, male   DOB: 11/12/1966, 50 y.o.   MRN: [id number]  Met with pt and partner yesterday afternoon to further discuss d/c planning needs given targeted d/c date of 8/18.  They are aware of targeted date and Wesley Burns reports that he is working to coordinate the 24/7 supervision needed between family and friends.  I did discuss possible option (if insurance would approve) of SNF if this care cannot be arranged.  They both decline this, noting that his SNF stay last year "was terrible.. We're not going to do that again...".  Wesley Burns to keep me posted on arrangements being made so we can coordinate education sessions with caregiver.   Also discussed plan for our neuropsychologist to consult with pt today.  Wesley Burns states he is hopeful that pt "will be honest about his drinking...".  Will continue to follow.  HOYLE, LUCY, LCSW   

## 2016-07-11 NOTE — Progress Notes (Signed)
Occupational Therapy Note  Patient Details  Name: HAIDER HORNADAY MRN: [id number] Date of Birth: 12-05-1965  Today's Date: 07/11/2016 OT Individual Time: 1100-1120 OT Individual Time Calculation (min): 20 min  and Today's Date: 07/11/2016 OT Missed Time: 40 Minutes Missed Time Reason: Patient fatigue;Patient unwilling/refused to participate without medical reason   Pt supine in bed upon entering the room. Pt with no c/o pain but does report , "I'm just so sleepy." Pt declining OT intervention this session. OT discussing and educating  pt of the expectations for inpatient rehab stay in regards to participation, progress towards goals, and discharge situation. Pt states goal as, "Doing more for myself." However, pt continues to needing coaxing and encouragement for participation in sessions. Pt continues to declined and is falling asleep intermittently while therapist is talking to him. Bed alarm activated and call bell within reach as OT exits the room.    Lowella Grip 07/11/2016, 12:23 PM

## 2016-07-11 NOTE — Progress Notes (Signed)
Social Work Patient ID: Wesley Burns, male   DOB: 06-Apr-1966, 50 y.o.   MRN: [id number]   Alerted by PT that pt has refused all therapies today citing reason being bowel issues.  I have spoken with pt this afternoon about his refusals and he apologizes but reports that "my bowels are blocked..my stomach is so swollen...".  Per nsg, pt had BM yesterday.  Attempted to engage pt in discussion of his goals for CIR and explaining that if he does not participate then we cannot justify continuing to keep him here.  Explained that team is concerned about his lack of engagement.  I questioned if he felt he had the emotional stamina to do therapies. He reports that he does want to do the program "but I just feel so bad."  I presented to him the option of a Palliative Care referral if he truly did not feel he physically or emotionally had the desire to continue with medical and therapeutic txs.  Also presented to him the potential that SNF will be needed if he doesn't participate and reach his supervision goals. Discussed all of the above with Deatra Ina, PA who will monitor participation.  Continue to follow.  Ojas Coone, LCSW

## 2016-07-11 NOTE — Consult Note (Signed)
PSYCHODIAGNOSTIC EVALUATION - [facility]FIDENTIAL [facility]   [name] is a 50 year old man, who was seen for an initial psychodiagnostic examination to assess for potential depression, anxiety, or other mental illness in the setting of stroke and alcohol abuse.    During the session, [name] generally denied depressed mood; he also denied suicidal ideation.  However, although he did not report depressive symptoms, his affect during the evaluation was flat and he reported reluctance to participate in therapy at times due to leg swelling.  He also noted that he had been unable to have a "real job" because of his prior foot amputation.  He did note some anxious mood, characterized by worry about how his stroke was induced (alcohol use).  He reported confusion regarding how the amount that he was drinking (2 large rum-based drinks 2-3 times per week) could have resulted in the stroke, though he said that "people have explained it to [him]."  He commented that he "cut down" his alcohol use recently because one year ago he was drinking a greater quantity of alcohol and drinking daily, but he ended up in the hospital due to multiple organ dysfunction (liver and kidney).  He had also been smoking more (half of a pack daily; reduced to 2-3 cigarettes per day over the past year).  [name] said that his prior patterns of alcohol and tobacco use were consistent over 10 years prior to his hospitalization last year.  [name] acknowledged that he now understands that his alcohol use is what led to his stroke and he understands the need to stop drinking and smoking; he plans to follow through on those recommendations because he does not want to have another stroke.  He denied other concerns at this time. Time was spent exploring how [name] will handle stress if he discontinues drinking alcohol (his prior coping method).  He was unable to generate many possible solutions to stress  management, but ultimately noted that he can play with his dog, watch PBS on television, and talk to his partner in order to cope with stress as an alternative to drinking.    [name] described significant fatigue at the time of the current session and stated that he has felt as though he has not had much rest since being on the unit.  He did not report problems with appetite.  He stated that he feels as though he may be constipated and he was advised to inform his physician about that symptom.    IMPRESSION:  [name] clearly presents with alcohol abuse.  However, he was unwilling to admit to alcoholism despite overwhelming evidence of the adverse effects alcohol use has had on his health.  He would therefore not likely be a good candidate for AA participation at this time.  Instead, he may benefit from individual psychotherapy with a motivational interviewing orientation.  His social worker agreed to look into programs in his area that could be recommended to him upon discharge.  I was not able to obtain enough information per his report in order to warrant a diagnosis of depressive or anxiety disorder at this time, though both seem possible, particularly given apparent learned helplessness regarding his physical capabilities.  His physician may choose to look into treatment for possible constipation as documented above.  Continued support from the neuropsychologist could be provided should his care team feel that it would be beneficial.    DIAGNOSES:   Alcohol Abuse R/O depressive disorder R/O  anxiety disorder  Wesley Burns, Psy.D.  Clinical Neuropsychologist
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
## 2016-07-11 NOTE — Progress Notes (Signed)
[facility] Progress Note  Subjective/Interval Events: UOP 750 ccs recorded yesterday plus 3 unmeasured occurrences Patient feels he is urinating more than usual with increased Lasix He has not noticed much change in his LE swelling Hgb 7.9 this AM s/p transfusion of 1 unit PRBC (8/9)    Objective:  I/O last 3 completed shifts: In: 677 [P.O.:360; Blood:317] Out: 1050 [Urine:1050] No intake/output data recorded.   Physical Examination: BP (!) 151/74   Pulse 71   Temp 99.2 F (37.3 C) (Oral)   Resp 18   Ht 5\' 11"  (1.803 m)   Wt 184 lb 6.4 oz (83.6 kg)   SpO2 95%   BMI 25.72 kg/m   General: resting in bed, comfortable, no acute distress Cardiac: RRR, systolic murmur heard at LUS border Pulm: clear to auscultation, normal effort Abd: soft, nontender, nondistended Ext: warm and well perfused, slightly tender to touch bilateral thighs, pitting edema b/l LE at shins and thighs Neuro: Flat affect   Scheduled Medications: . amLODipine  10 mg Oral Daily  . calcitRIOL  0.25 mcg Oral Daily  . cloNIDine  0.1 mg Oral BID  . darbepoetin (ARANESP) injection - NON-DIALYSIS  100 mcg Subcutaneous Q Mon-1800  . ferrous sulfate  325 mg Oral TID WC  . furosemide  80 mg Oral BID  . heparin  5,000 Units Subcutaneous Q8H  . hydrALAZINE  25 mg Oral Q8H  . insulin aspart  0-9 Units Subcutaneous TID WC  . labetalol  300 mg Oral TID  . magnesium oxide  400 mg Oral Daily  . pantoprazole  40 mg Oral Daily  . sodium bicarbonate  650 mg Oral TID   PRN Meds:.acetaminophen **OR** acetaminophen, methocarbamol, ondansetron **OR** ondansetron (ZOFRAN) IV, sorbitol, traZODone   Recent Labs Lab 07/07/16 0644 07/08/16 0537 07/09/16 0712 07/10/16 0623  NA 132* 133* 133* 131*  K 5.0 4.9 5.3* 4.9  CL 110 110 107 105  CO2 14* 15* 16* 17*  GLUCOSE 106* 96 99 94  BUN 50* 51* 52* 54*  CREATININE 4.34* 4.60* 4.67* 4.82*  CALCIUM 8.3* 8.9 9.0 9.0  PHOS 3.8 4.5 5.0*  --      Recent  Labs Lab 07/05/16 0538 07/07/16 0644 07/08/16 0537 07/09/16 0712  AST 76*  --   --   --   ALT 99*  --   --   --   ALKPHOS 211*  --   --   --   BILITOT 0.5  --   --   --   PROT 4.6*  --   --   --   ALBUMIN 1.7* 1.7* 1.8* 1.8*     Recent Labs Lab 07/05/16 0538 07/10/16 0623 07/10/16 0817 07/11/16 0628  WBC 5.5 10.5 10.3 9.3  NEUTROABS 3.8 7.4  --   --   HGB 7.1* 6.5* 6.7* 7.9*  HCT 21.4* 19.5* 20.1* 24.0*  MCV 89.9 91.1 92.6 90.2  PLT 370 399 355 371      Recent Labs Lab 07/10/16 0647 07/10/16 1131 07/10/16 1628 07/10/16 2103 07/11/16 0658  GLUCAP 104* 111* 111* 115* 89   Results for KINGSLEE, MAIRENA (MRN [name]) as of 07/06/2016 13:21  Ref. 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 He required Nicardipine drip for hypertensive emergency (off drip on 7/31). We were asked to see because of worsening renal function, creatinine increase from 3.78->4.23. 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 Has had proteinuria longstanding, 2.8 grams 08/2013  2/2 diabetic/hypertensive nephropathy; normal complements this admission;  neg HIV, Hep B, Hep C, ANCA , kappa/lambda ratio 04/2015, repeat urine study: 0-5 rbcs, >300 protein, Pro/Cr 7.78. Normal size kidneys 12.6, 12.3 cm 04/2015).  - creatinine trending up to 4.82 (8/9), morning renal labs pending - Discontinued ARB with this very low GFR and should not be restarted -  Cannot pursue permanent vascular access yet with recent  stroke - will likely need dialysis in the near future -  Sodium bicarbonate 650 mg TID -  Dr. Eliott Nine plans to see him in the office after his discharge from rehab  - Continue Lasix 80 mg po BID for edema, may consider IV lasix for further diuresis pending renal labs  Secondary hyperparathyroidism -  PTH 128  -  Calcitriol 0.25 mcg daily  HTN: Hypertensive crisis requiring Nicardipine drip on admission (off drip since 7/31). BPs elevated, some medication changes made were lost on transfer to CIR. Have been added back. -Continue Clonidine 0.1 mg BID -Labetolol 300 TID -amlodipine 10mg  daily -Hydralazine 25 mg tid -Lasix 80 mg po BID -No ACE/ARB -Anticipate some improvement in BP with lasix diuresis  Anemia: - Hgb 7.9 this morning, no obvious sign of bleeding. S/p 1 unit PRBC on 8/9. -  Ferrous sulfate 325 mg TID -  Giving Feraheme 510 IV X 2 doses, spaced a week apart (received 1st dose 8/2, received 2nd dose 8/9) -  Aranesp 100 mcg/week  -Monitor signs/symptoms of active bleeding  Constipation: Reports trouble with bowel movement, last one yesterday morning. Will add prn Miralax.  Gout: Right knee flare this admission s/p aspiration and Depomedrol injection with relief.  Colchicine was added, but now discontinued in light of increased SCr and LFTs. - holding Colchicine  Elevated LFTs: AST/ALT jumped to 285/103 on 8/2 from normal range.  Possibly related to Colchicine.  Abdominal U/S in May 2016 showed nodular appearance compatible with hepatic cirrhosis.  Has reported history of EtOH abuse.  -  EtOH cessation.  Hx T2DM: Hgb A1c 4.5, diet-controlled.  Vishal Patel  8:47 AM IMTS PGY-2   Renal Attending: He appears more comfortable, as noted above, with less edema and improved UOP.  Will continue to support with diuretic tx. Lissie Hinesley C

## 2016-07-11 NOTE — Progress Notes (Signed)
Speech Language Pathology Weekly Progress and Session Note  Patient Details  Name: Wesley Burns MRN: [id number] Date of Birth: 1966-07-17  Beginning of progress report period: July 04, 2016 End of progress report period: July 11, 2016  Today's Date: 07/11/2016 SLP Individual Time: 1000-1100 SLP Individual Time Calculation (min): 60 min   Short Term Goals: Week 1: SLP Short Term Goal 1 (Week 1): Pt will selectively attend to functional tasks in a mildly distracting environment for 10 minutes with supervision verbal cues for redirection.  SLP Short Term Goal 1 - Progress (Week 1): Met SLP Short Term Goal 2 (Week 1): Pt will complete familiar functional tasks with supervision verbal cues for functional problem solving.   SLP Short Term Goal 2 - Progress (Week 1): Progressing toward goal SLP Short Term Goal 3 (Week 1): Pt will recognize and correct errors in the moment during familiar, functional tasks with supervision verbal cues.   SLP Short Term Goal 3 - Progress (Week 1): Progressing toward goal SLP Short Term Goal 4 (Week 1): Pt will utilize external aids to recall daily information with supervision verbal cues.   SLP Short Term Goal 4 - Progress (Week 1): Progressing toward goal SLP Short Term Goal 5 (Week 1): Pt will identify 1 physical and 1 cognitive deficit with supervision verbal cues. SLP Short Term Goal 5 - Progress (Week 1): Progressing toward goal    New Short Term Goals: Week 2: SLP Short Term Goal 1 (Week 2): Pt will complete familiar functional tasks with supervision verbal cues for functional problem solving.   SLP Short Term Goal 2 (Week 2): Pt will recognize and correct errors in the moment during familiar, functional tasks with supervision verbal cues.   SLP Short Term Goal 3 (Week 2): Pt will utilize external aids to recall daily information with supervision verbal cues.   SLP Short Term Goal 4 (Week 2): Pt will identify 1 physical and 1 cognitive deficit with  supervision verbal cues.  Weekly Progress Updates: Pt's progress this week has been limited by his affect and fatigue. He has required significant verbal encouragement to participate with therapy sessions. Will continue to address higher level cognitive-linguistic impairments in therapy to maximize functional independence. Pt currently denies cognitive impairment.   Intensity: Minumum of 1-2 x/day, 30 to 90 minutes Frequency: 3 to 5 out of 7 days Duration/Length of Stay: 8/18 Treatment/Interventions: Cognitive remediation/compensation;Cueing hierarchy;Patient/family education;Internal/external aids;Environmental controls;Functional tasks   Daily Session  Skilled Therapeutic Interventions: Pt seen for skilled speech therapy. Pt able to state multiple physical limitations, but despite mod cueing, he did not endorse cognitive impairment. Limited ability to recall daily information which he excused secondary to fatigue. Functional math problem solving completed with min-mod A for working memory/attention to detail.       Function:   Eating Eating   Modified Consistency Diet: No Eating Assist Level: Set up assist for   Eating Set Up Assist For: Opening containers;Cutting food       Cognition Comprehension Comprehension assist level: Follows basic conversation/direction with extra time/assistive device  Expression   Expression assist level: Expresses basic needs/ideas: With extra time/assistive device  Social Interaction Social Interaction assist level: Interacts appropriately 90% of the time - Needs monitoring or encouragement for participation or interaction.  Problem Solving Problem solving assist level: Solves basic problems with no assist  Memory Memory assist level: Recognizes or recalls 75 - 89% of the time/requires cueing 10 - 24% of the time   General  Pain Pain Assessment Pain Assessment: 0-10 Pain Score: 4  Pain Location: Abdomen Pain Intervention(s): MD notified  (Comment) (on daily rounding, secondary to constipation)  Therapy/Group: Individual Therapy  Vinetta Bergamo MA, CCC-SLP 07/11/2016, 1:19 PM

## 2016-07-12 ENCOUNTER — Inpatient Hospital Stay (HOSPITAL_COMMUNITY): Payer: 59 | Admitting: Physical Therapy

## 2016-07-12 ENCOUNTER — Inpatient Hospital Stay (HOSPITAL_COMMUNITY): Payer: 59 | Admitting: Occupational Therapy

## 2016-07-12 ENCOUNTER — Inpatient Hospital Stay (HOSPITAL_COMMUNITY): Payer: 59 | Admitting: Speech Pathology

## 2016-07-12 LAB — GLUCOSE, CAPILLARY
GLUCOSE-CAPILLARY: 113 mg/dL — AB (ref 65–99)
GLUCOSE-CAPILLARY: 93 mg/dL (ref 65–99)
GLUCOSE-CAPILLARY: 142 mg/dL — AB (ref 65–99)
Glucose-Capillary: 101 mg/dL — ABNORMAL HIGH (ref 65–99)
Glucose-Capillary: 109 mg/dL — ABNORMAL HIGH (ref 65–99)

## 2016-07-12 LAB — CBC WITH DIFFERENTIAL/PLATELET
Basophils Absolute: 0 10*3/uL (ref 0.0–0.1)
Basophils Relative: 0 %
Eosinophils Absolute: 0.3 10*3/uL (ref 0.0–0.7)
Eosinophils Relative: 3 %
HCT: 22.7 % — ABNORMAL LOW (ref 39.0–52.0)
Hemoglobin: 7.5 g/dL — ABNORMAL LOW (ref 13.0–17.0)
LYMPHS ABS: 1.8 10*3/uL (ref 0.7–4.0)
LYMPHS PCT: 20 %
MCH: 29.9 pg (ref 26.0–34.0)
MCHC: 33 g/dL (ref 30.0–36.0)
MCV: 90.4 fL (ref 78.0–100.0)
Monocytes Absolute: 1.3 10*3/uL — ABNORMAL HIGH (ref 0.1–1.0)
Monocytes Relative: 15 %
NEUTROS ABS: 5.6 10*3/uL (ref 1.7–7.7)
NEUTROS PCT: 62 %
PLATELETS: 339 10*3/uL (ref 150–400)
RBC: 2.51 MIL/uL — AB (ref 4.22–5.81)
RDW: 17.3 % — ABNORMAL HIGH (ref 11.5–15.5)
WBC: 9.1 10*3/uL (ref 4.0–10.5)

## 2016-07-12 LAB — BASIC METABOLIC PANEL
Anion gap: 12 (ref 5–15)
BUN: 59 mg/dL — AB (ref 6–20)
CHLORIDE: 103 mmol/L (ref 101–111)
CO2: 16 mmol/L — AB (ref 22–32)
CREATININE: 4.79 mg/dL — AB (ref 0.61–1.24)
Calcium: 9.2 mg/dL (ref 8.9–10.3)
GFR calc Af Amer: 15 mL/min — ABNORMAL LOW (ref >60)
GFR, EST NON AFRICAN AMERICAN: 13 mL/min — AB (ref >60)
GLUCOSE: 90 mg/dL (ref 65–99)
POTASSIUM: 4.6 mmol/L (ref 3.5–5.1)
SODIUM: 131 mmol/L — AB (ref 135–145)

## 2016-07-12 MED ORDER — ALUM & MAG HYDROXIDE-SIMETH 200-200-20 MG/5ML PO SUSP
30.0000 mL | ORAL | Status: DC | PRN
  Administered 2016-07-12: 30 mL via ORAL
  Filled 2016-07-12: qty 30

## 2016-07-12 NOTE — Progress Notes (Signed)
Occupational Therapy Weekly Progress Note  Patient Details  Name: Wesley Burns MRN: [id number] Date of Birth: 12/04/1965  Beginning of progress report period: 07/06/16 End of progress report period: 07/12/16  Today's Date: 07/12/2016 OT Individual Time: 3295-1884 OT Individual Time Calculation (min): 74 min     Patient has met 3 of 3 short term goals.    Patient continues to demonstrate the following deficits: activity tolerance, UB strength, left sided weakness, and standing balance and therefore will continue to benefit from skilled OT intervention to enhance overall performance with BADLs. Pt has progressed since time of evaluation with functional transfer status, LB dressing abilities, and safety awareness. Pt still requires max encouragement to participate in tx due to c/o fatigue. Pt has declined therapy for multiple sessions this past week with education provided on rehab potential and importance of participating in all therapy sessions with pt and partner verbalized understanding. At this time, pt is progressing towards long term goal achievement.   Patient progressing toward long term goals..  Continue plan of care.  OT Short Term Goals Week 2:  OT Short Term Goal 1 (Week 2): Pt will complete LB ADLs with Min A OT Short Term Goal 2 (Week 2): Pt will complete bathing with supervision and AE as needed  OT Short Term Goal 3 (Week 2): Pt will engage in therapeutic or self care related activities for 7 minutes without rest breaks  OT Short Term Goal 4 (Week 2): Pt will complete toilet transfers with RW and supervision OT Short Term Goal 5 (Week 2): Pt will complete sit>stand during LB dressing with supervision   Skilled Therapeutic Interventions/Progress Updates: Pt participated in skilled OT session focusing on standing balance, self sequencing, and demonstrating appropriate use of AE during LB ADLs. Pt was lying supine in bed upon skilled OT arrival with partner [name] present. Pt  was agreeable to therapy this morning with encouragement from Pringle and therapist. No c/o pain and improvement with stomachache. Pt completed supine to sit with supervision and extra time for moving LEs. Functional transfer completed with Mod A sit to stand and stand step to w/c. UB/LB ADLs completed w/c level at sink with active integration of L UE by patient. Pt completed LB bathing with use of footstool for reaching LEs. Instruction on use of reacher provided for carryover during LB dressing with pt able to thread LEs into pants and pull pants over hips in standing today. At the end of dressing, pt reported needing to void. Pt ambulated into bathroom with steadying assistance and RW to transfer to [facility] over toilet. Pt completed with Min A. Pt completed toileting tasks with steadying assistance and extra time. At end of session, pt was returned to w/c with all needs within reach and partner present. Increased edema observed with pt requiring large TED hose to fit LEs. Mod-Max questioning cues required for sequencing and problem solving due to delayed responses/reactions during completion of self care tasks.      Therapy Documentation Precautions: Precautions Precautions: Fall Precaution Comments: h/o R foot amputation, L charcot foot, bilat THA, falls Restrictions Weight Bearing Restrictions: No   ADL: ADL Upper Body Bathing: Supervision/safety Where Assessed-Upper Body Bathing: Sitting at sink Lower Body Bathing: Moderate assistance Where Assessed-Lower Body Bathing: Sitting at sink Upper Body Dressing: Supervision/safety Where Assessed-Upper Body Dressing: Sitting at sink Lower Body Dressing: Maximal assistance Where Assessed-Lower Body Dressing: Sitting at sink Toileting: Minimal assistance Where Assessed-Toileting: Glass blower/designer: Moderate assistance Toilet Transfer Method: Stand pivot  Science writer: Grab bars, Raised toilet seat     See Function Navigator for  Current Functional Status.   Therapy/Group: Individual Therapy  Abbagail Scaff A Jaelee Laughter 07/12/2016, 10:49 AM

## 2016-07-12 NOTE — Progress Notes (Signed)
Occupational Therapy Session Note  Patient Details  Name: Wesley Burns MRN: [id number] Date of Birth: 1966/03/06  Today's Date: 07/12/2016 OT Individual Time: 1117-1200 OT Individual Time Calculation (min): 43 min     Short Term Goals:Week 1:  OT Short Term Goal 1 (Week 1): Pt will complete LB ADLs with Mod A with use of AE OT Short Term Goal 2 (Week 1): Pt will complete toilet transfers with Min A OT Short Term Goal 3 (Week 1): Pt will engage in 5 minutes of therapuetic activity without rest breaks to increase activity tolerance for self care   Skilled Therapeutic Interventions/Progress Updates:    Pt seen for skilled OT session focusing on activity tolerance and UB strengthening. Pt sitting in recliner upon arrival and agreeable to tx session. OT demonstrated UB exercises and had pt perform shoulder extension, shoulder flexion, and bicep curls 1X 10 reps using #2 dowel rod. OT educated pt on the  Importance of moving BUE to prevent contractures. Pt complained of finger abnormality and OT educated on arthritis and recommended pt talk to MD.  Pt went to college for art therefore OT engaged in drawing picture of therapist in order to engage pt in meaningful activity and encourage pt to use art as a healthy coping strategy instead of alcohol which pt currently utilizes.  While drawing pt practiced bringing trunk off of recliner to increase core strength. OT encouraged pt to sit in recliner instead of bed to build activity tolerance. To increase pt comfort, hot pads were applied to back and pt left sitting in recliner with all needs met.   Therapy Documentation Precautions:  Precautions Precautions: Fall Precaution Comments: h/o R foot amputation, L charcot foot, bilat THA, falls Restrictions Weight Bearing Restrictions: No  Pain: Denies Pain ADL: ADL Upper Body Bathing: Supervision/safety Where Assessed-Upper Body Bathing: Sitting at sink Lower Body Bathing: Moderate  assistance Where Assessed-Lower Body Bathing: Sitting at sink Upper Body Dressing: Supervision/safety Where Assessed-Upper Body Dressing: Sitting at sink Lower Body Dressing: Maximal assistance Where Assessed-Lower Body Dressing: Sitting at sink Toileting: Minimal assistance Where Assessed-Toileting: Glass blower/designer: Moderate assistance Toilet Transfer Method: Stand pivot Toilet Transfer Equipment: Grab bars, Raised toilet seat Exercises:   Other Treatments:    See Function Navigator for Current Functional Status.   Therapy/Group: Individual Therapy  [name] 07/12/2016, 12:30 PM
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
## 2016-07-12 NOTE — Progress Notes (Signed)
Physical Therapy Weekly Progress Note  Patient Details  Name: Wesley Burns MRN: [id number] Date of Birth: [birth year]  Beginning of progress report period: July 04, 2016 End of progress report period: July 12, 2016  Today's Date: 07/12/2016 PT Individual Time: 9390-3009 PT Individual Time Calculation (min): 60 min    Patient has met 2 of 5 short term goals.  Pt challenged this week by extreme fatigue and question emotional strain.  PT providing ongoing education regarding pt's goals for d/c home with supervision and importance of daily activity and remaining out of bed to increase activity tolerance.  Pt's partner, [name], present during therapy sessions on Friday.    Patient continues to demonstrate the following deficits: strength, endurance, ROM, and pain and therefore will continue to benefit from skilled PT intervention to enhance overall performance with activity tolerance, balance, postural control, attention and coordination.  Patient progressing toward long term goals..  Continue plan of care.  PT Short Term Goals Week 1:  PT Short Term Goal 1 (Week 1): Pt will perform bed mobility on flat bed with min A PT Short Term Goal 1 - Progress (Week 1): Met PT Short Term Goal 2 (Week 1): Pt will perform bed <> chair transfers consistently with min A PT Short Term Goal 2 - Progress (Week 1): Not met PT Short Term Goal 3 (Week 1): Pt will perform w/c mobility x 150' with min A for UE strengthening/endurance PT Short Term Goal 3 - Progress (Week 1): Met PT Short Term Goal 4 (Week 1): Pt will negotiate 4 steps with 2 rails and min A PT Short Term Goal 4 - Progress (Week 1): Not met PT Short Term Goal 5 (Week 1): Pt will ambulate x 150' with LRAD and min A PT Short Term Goal 5 - Progress (Week 1): Not met Week 2:  PT Short Term Goal 1 (Week 2): Pt will ambulate 100' with LRAD and close supervision PT Short Term Goal 2 (Week 2): Pt will negotiate 4 steps with 2 rails and min assist PT  Short Term Goal 3 (Week 2): Pt will tolerate 5 minutes of cardiovascular activity with no rest breaks    Skilled Therapeutic Interventions/Progress Updates:    Pt received resting in w/c with c/o soreness in neck and shoulders, but agreeable to therapy.  RN alerted to pt's pain and in to provide medication and heat during session.  Pt propelled w/c to therapy gym with supervision and more than a reasonable amount of time.  PT instructed pt in BLE therex with 1# weight x10 reps for LAQ with 3 second hold, hip adduction against ball, and standing marching.  Rest breaks provided as needed.  Gait training x65' with RW and steady assist.  Pt requiring mod assist for sit<>stand today for light lifting assist and mod verbal cues to control descent when sitting.  Pt returned to room at end of session and agreeable to transition to recliner.  Mod assist for stand, and steady assist for pivot.  Pt positioned to comfort with hot packs applied to bilat shoulders, call bell in reach and needs met.   Therapy Documentation Precautions:  Precautions Precautions: Fall Precaution Comments: h/o R foot amputation, L charcot foot, bilat THA, falls Restrictions Weight Bearing Restrictions: No   See Function Navigator for Current Functional Status.  Therapy/Group: Individual Therapy  Earnest Conroy Penven-Crew 07/12/2016, 12:14 PM

## 2016-07-12 NOTE — Progress Notes (Signed)
Speech Language Pathology Daily Session Note  Patient Details  Name: Wesley Burns MRN: [id number] Date of Birth: 09/13/1966  Today's Date: 07/12/2016 SLP Individual Time: 1300-1355 SLP Individual Time Calculation (min): 55 min   Short Term Goals: Week 2: SLP Short Term Goal 1 (Week 2): Pt will complete familiar functional tasks with supervision verbal cues for functional problem solving.   SLP Short Term Goal 2 (Week 2): Pt will recognize and correct errors in the moment during familiar, functional tasks with supervision verbal cues.   SLP Short Term Goal 3 (Week 2): Pt will utilize external aids to recall daily information with supervision verbal cues.   SLP Short Term Goal 4 (Week 2): Pt will identify 1 physical and 1 cognitive deficit with supervision verbal cues.  Skilled Therapeutic Interventions:Pt seen for skilled SLP treatment with focus on cognitive-linguistic goals. Pt was able to recall daily events with 80% acc mod I. Functional math reasoning problems completed at mod I when related to money and with min A when related to time. Min A throughout the session for initiation.  Function:  Eating Eating   Modified Consistency Diet: No Eating Assist Level: Set up assist for   Eating Set Up Assist For: Opening containers       Cognition Comprehension Comprehension assist level: Follows basic conversation/direction with extra time/assistive device  Expression   Expression assist level: Expresses basic needs/ideas: With extra time/assistive device  Social Interaction Social Interaction assist level: Interacts appropriately 90% of the time - Needs monitoring or encouragement for participation or interaction.  Problem Solving Problem solving assist level: Solves basic 90% of the time/requires cueing < 10% of the time  Memory Memory assist level: Recognizes or recalls 75 - 89% of the time/requires cueing 10 - 24% of the time    Pain Pain Assessment Pain Assessment:  0-10 Pain Score: 3  Pain Location: Back Pain Intervention(s): Repositioned  Therapy/Group: Individual Therapy  Rocky Crafts MA, CCC-SLP 07/12/2016, 3:43 PM

## 2016-07-12 NOTE — Progress Notes (Signed)
Wesley Burns  Subjective/Interval Events: UOP 950 ccs recorded yesterday plus 1 unmeasured occurrence Patient reports increased urine output, no change in lower extremity swelling. He reports drinking a lot of water early this admission.  Objective:  I/O last 3 completed shifts: In: 480 [P.O.:480] Out: 1551 [Urine:1550; Stool:1] Total I/O In: -  Out: 300 [Urine:300]   Physical Examination: BP (!) 157/80   Pulse 86   Temp 98.8 F (37.1 C) (Oral)   Resp 16   Ht 5\' 11"  (1.803 m)   Wt 179 lb 6.4 oz (81.4 kg)   SpO2 99%   BMI 25.02 kg/m   General: resting in bed, comfortable, no acute distress Cardiac: RRR Pulm: clear to auscultation, normal effort Abd: soft, nontender, nondistended Ext: warm and well perfused, pitting edema b/l LE at shins, thighs, presacral area Neuro: Flat affect   Scheduled Medications: . amLODipine  10 mg Oral Daily  . calcitRIOL  0.25 mcg Oral Daily  . cloNIDine  0.1 mg Oral BID  . darbepoetin (ARANESP) injection - NON-DIALYSIS  100 mcg Subcutaneous Q Mon-1800  . ferrous sulfate  325 mg Oral TID WC  . furosemide  80 mg Oral BID  . heparin  5,000 Units Subcutaneous Q8H  . hydrALAZINE  25 mg Oral Q8H  . insulin aspart  0-9 Units Subcutaneous TID WC  . labetalol  300 mg Oral TID  . lidocaine  1 application Topical Once  . magnesium oxide  400 mg Oral Daily  . pantoprazole  40 mg Oral Daily  . sodium bicarbonate  650 mg Oral TID   PRN Meds:.acetaminophen **OR** acetaminophen, methocarbamol, ondansetron **OR** ondansetron (ZOFRAN) IV, polyethylene glycol, sorbitol, traZODone   Recent Labs Lab 07/08/16 0537 07/09/16 0712 07/10/16 0623 07/11/16 1130 07/12/16 0552  NA 133* 133* 131* 130* 131*  K 4.9 5.3* 4.9 4.9 4.6  CL 110 107 105 106 103  CO2 15* 16* 17* 16* 16*  GLUCOSE 96 99 94 118* 90  BUN 51* 52* 54* 60* 59*  CREATININE 4.60* 4.67* 4.82* 4.81* 4.79*  CALCIUM 8.9 9.0 9.0 9.0 9.2  PHOS 4.5 5.0*  --  5.7*  --       Recent Labs Lab 07/08/16 0537 07/09/16 0712 07/11/16 1130  ALBUMIN 1.8* 1.8* 1.9*     Recent Labs Lab 07/10/16 0623 07/10/16 0817 07/11/16 0628 07/12/16 0552  WBC 10.5 10.3 9.3 9.1  NEUTROABS 7.4  --   --  5.6  HGB 6.5* 6.7* 7.9* 7.5*  HCT 19.5* 20.1* 24.0* 22.7*  MCV 91.1 92.6 90.2 90.4  PLT 399 355 371 339      Recent Labs Lab 07/11/16 0658 07/11/16 1131 07/11/16 1631 07/11/16 2123 07/12/16 0649  GLUCAP 89 130* 113* 119* 93   Results for LAQUAN, BEIER (MRN Wesley Burns) as of 07/06/2016 13:21  Ref. 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 He required Nicardipine drip for hypertensive emergency (off drip on 7/31). We were asked to see because of worsening renal function, creatinine increase from 3.78->4.23. Most recent outpt creatinines in the 3's (and pt had  been referred to [facility], but not yet seen)  Assessment/Plan:  CKD 4/5: Worsened\ing renal function since May 2016. Has had proteinuria longstanding, 2.8 grams 08/2013   - creatinine stable 4.82 >> 4.81 >> 4.79; will continue to monitor -No clear hard indication for emergent dialysis  - Discontinued ARB with this very low GFR and should not be restarted -  Cannot pursue permanent vascular access yet with recent stroke - will likely need dialysis in the near future -  Sodium bicarbonate 650 mg TID -  Dr. Eliott Nine plans to see him in the office after his discharge from rehab  - Continue Lasix 80 mg po BID for edema  Secondary hyperparathyroidism -  PTH 128  -  Calcitriol 0.25 mcg daily  HTN: Hypertensive crisis requiring  Nicardipine drip on admission (off drip since 7/31). -Continue Clonidine 0.1 mg BID -Labetolol 300 TID -amlodipine 10mg  daily -Hydralazine 25 mg tid -Lasix 80 mg po BID -No ACE/ARB -Anticipate some improvement in BP with lasix diuresis  Anemia: - Hgb 7.5 this morning, no obvious sign of bleeding. S/p 1 unit PRBC on 8/9. -  Ferrous sulfate 325 mg TID -  Received Feraheme 510 IV X 2 doses, spaced a week apart (received 1st dose 8/2, received 2nd dose 8/9) -  Aranesp 100 mcg/week  -Monitor signs/symptoms of active bleeding  Constipation: prn Miralax.  Gout: Right knee flare this admission s/p aspiration and Depomedrol injection with relief. Stable. Colchicine was added, but now discontinued in light of increased SCr and LFTs. - holding Colchicine  Elevated LFTs: AST/ALT jumped to 285/103 on 8/2 from normal range.  Possibly related to Colchicine.  Abdominal U/S in May 2016 showed nodular appearance compatible with hepatic cirrhosis.  Has reported history of EtOH abuse.  Depression/EtOH use: -Neuropsychologist consulted -  EtOH cessation.  Hx T2DM: Hgb A1c 4.5, diet-controlled.  Wesley Burns  11:18 AM IMTS PGY-2  Renal Attending: Renal fct appears to have stabilized.  We are attempting to diurese to remove excess volume. Wesley Burns C

## 2016-07-13 ENCOUNTER — Inpatient Hospital Stay (HOSPITAL_COMMUNITY): Payer: 59 | Admitting: Physical Therapy

## 2016-07-13 ENCOUNTER — Inpatient Hospital Stay (HOSPITAL_COMMUNITY): Payer: 59 | Admitting: Occupational Therapy

## 2016-07-13 LAB — RENAL FUNCTION PANEL
ANION GAP: 11 (ref 5–15)
Albumin: 1.9 g/dL — ABNORMAL LOW (ref 3.5–5.0)
BUN: 58 mg/dL — ABNORMAL HIGH (ref 6–20)
CALCIUM: 8.9 mg/dL (ref 8.9–10.3)
CO2: 16 mmol/L — AB (ref 22–32)
Chloride: 102 mmol/L (ref 101–111)
Creatinine, Ser: 5.09 mg/dL — ABNORMAL HIGH (ref 0.61–1.24)
GFR calc non Af Amer: 12 mL/min — ABNORMAL LOW (ref >60)
GFR, EST AFRICAN AMERICAN: 14 mL/min — AB (ref >60)
Glucose, Bld: 118 mg/dL — ABNORMAL HIGH (ref 65–99)
Phosphorus: 6.1 mg/dL — ABNORMAL HIGH (ref 2.5–4.6)
Potassium: 4.4 mmol/L (ref 3.5–5.1)
SODIUM: 129 mmol/L — AB (ref 135–145)

## 2016-07-13 LAB — GLUCOSE, CAPILLARY
GLUCOSE-CAPILLARY: 132 mg/dL — AB (ref 65–99)
GLUCOSE-CAPILLARY: 119 mg/dL — AB (ref 65–99)
Glucose-Capillary: 91 mg/dL (ref 65–99)
Glucose-Capillary: 129 mg/dL — ABNORMAL HIGH (ref 65–99)

## 2016-07-13 NOTE — Progress Notes (Signed)
Physical Therapy Session Note  Patient Details  Name: Wesley Burns MRN: [id number] Date of Birth: 1965/12/09  Today's Date: 07/13/2016 PT Individual Time: 1514-1610  PT Individual Time Calculation (min): 56 min    Short Term Goals: Week 1:  PT Short Term Goal 1 (Week 1): Pt will perform bed mobility on flat bed with min A PT Short Term Goal 1 - Progress (Week 1): Met PT Short Term Goal 2 (Week 1): Pt will perform bed <> chair transfers consistently with min A PT Short Term Goal 2 - Progress (Week 1): Not met PT Short Term Goal 3 (Week 1): Pt will perform w/c mobility x 150' with min A for UE strengthening/endurance PT Short Term Goal 3 - Progress (Week 1): Met PT Short Term Goal 4 (Week 1): Pt will negotiate 4 steps with 2 rails and min A PT Short Term Goal 4 - Progress (Week 1): Not met PT Short Term Goal 5 (Week 1): Pt will ambulate x 150' with LRAD and min A PT Short Term Goal 5 - Progress (Week 1): Not met Week 2:  PT Short Term Goal 1 (Week 2): Pt will ambulate 100' with LRAD and close supervision PT Short Term Goal 2 (Week 2): Pt will negotiate 4 steps with 2 rails and min assist PT Short Term Goal 3 (Week 2): Pt will tolerate 5 minutes of cardiovascular activity with no rest breaks   Skilled Therapeutic Interventions/Progress Updates:    Patient received sitting in WC and agreeable to PT, but states that his shoulder and back are in a lot of pain. Patient note dto have heating pack on the R scapula.   WC mobility in hall for 152f with supervision A and min cues for posture and UE positioning. Second bout of WC mobility for 1595fwith min A for obstacle navigation on the L and cues for improved seating position.   PT instructed patient in stand pivot transfer with RW on and off nustep with max A for transfer on, and mod A for transfer off nustep. Mod cues for UE placement and improved forward weight shift.   Nustep endurance training for 10 minutes level 2>4 with one 2  minutes rest break at 7 minutes. PT provided min cues for increased speed to maintain steps per minute >35 and cues for improved hip control to prevent excessive ER.   Gait training with RW for 11060fith min A from PT. Patient noted to have decreased stance time on the LLE and forward flexed posture, that only improved slightly with cues from PT.   Patient performed sit<>stand from new 20x18 WC with mod A x 3 and min cues for UE positioning and improved anterior weight shift.   Sit<>supine transfer with min A from PT to assist at the LLE with min cues for UE positioning and sequencing for improved ease of transfer.   PT instructed patient in supine therex including glute sets, SAQ, hip abduction/adduction; x 10 BLE for all therex with min cues for improved ROM as tolerated.   Patient left supine in bed with call bell in reach an all needs met.     Therapy Documentation Precautions:  Precautions Precautions: Fall Precaution Comments: h/o R foot amputation, L charcot foot, bilat THA, falls Restrictions Weight Bearing Restrictions: No    Pain: 6/10 R shoulder; sharp    See Function Navigator for Current Functional Status.   Therapy/Group: Individual Therapy  AusLorie Phenix12/2017, 5:57 PM

## 2016-07-13 NOTE — Progress Notes (Signed)
At 2200 complained of tightness in chest when taking a deep breath or coughing and difficulty "catching my breath." Vitals WNL. Paged Dr. Amador Cunas, orders to give PRN tylenol and robaxin and give a dose of Maalox. Instructed patient to call if symptoms worsened. At 2300 patient reports feeling better. Alfredo Martinez A
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
## 2016-07-13 NOTE — Progress Notes (Addendum)
Occupational Therapy Session Note  Patient Details  Name: Wesley Burns MRN: [id number] Date of Birth: 1966/05/30  Today's Date: 07/13/2016 OT Individual Time: 9379-0240 and 9735-3299 OT Individual Time Calculation (min): 33 min and 61 minutes  Short Term Goals: Week 2:  OT Short Term Goal 1 (Week 2): Pt will complete LB ADLs with Min A OT Short Term Goal 2 (Week 2): Pt will complete bathing with Min A sit<stand OT Short Term Goal 3 (Week 2): Pt will engage in therapeutic or self care related activities for 7 minutes without rest breaks  OT Short Term Goal 4 (Week 2): Pt will complete toilet transfers with RW and supervision OT Short Term Goal 5 (Week 2): Pt will complete sit>stand during LB dressing with Min A   Skilled Therapeutic Interventions/Progress Updates:   Pt was seen in the morning for self care mgt retraining. Pt was agreeable to participate in tx today. With extra time, pt completed supine to sit with supervision. Increase in LE edema noted with pt reporting increased discomfort. Functional transfer from bed to w/c completed with mod verbal instruction on technique and Mod A. UB/LB ADLs completed w/c level at sink with questioning cues for self sequencing. Pt exhibits strong visual learning preferences as evidenced by scanning room when inquired what "next step" to ADL is. Pt would benefit from visual aid in room to assist with sequencing. Sit<stand completed with occasional Min A today but still Mod A overall. Pt continues to require encouragement and instruction on appropriate use of AE and when to use footstool. Nursing contacted regarding skin abrasion on left shin. Pt was left in w/c with bilateral LEs elevated per pt comfort and all needs within reach. Nursing reported being in shortly to dress wound and apply new large thigh-high TED stockings that are now required for pt.  2nd Session 1:1 Tx (33 minutes)  Pt participated in tx today focused on increasing independence with  doffing/donning footwear with edema. Pt was provided shoe horn and reacher and required extra time and instruction on problem solving and use. Pt still required Mod-Max A to don shoes. Due to c/o shoulder discomfort, pt completed bilateral UE shoulder stretches with MHP and report of pain relief. At end of session pt was left in w/c with all needs within reach. Education provided today on importance of mobilizing LEs during functional activities and also at rest for edema mgt with verbalized understanding.  Therapy Documentation Precautions:  Precautions Precautions: Fall Precaution Comments: h/o R foot amputation, L charcot foot, bilat THA, falls Restrictions Weight Bearing Restrictions: No General:   Vital Signs: Therapy Vitals Temp: 98.1 F (36.7 C) Temp Source: Oral Pulse Rate: 66 Resp: 17 BP: 125/64 Patient Position (if appropriate): Sitting Oxygen Therapy SpO2: 97 % O2 Device: Not Delivered Pain: Min c/o pain in shoulders with MHP and UB stretches easing pain level  Pain Assessment Pain Score: 3  ADL: ADL Upper Body Bathing: Supervision/safety Where Assessed-Upper Body Bathing: Sitting at sink Lower Body Bathing: Moderate assistance Where Assessed-Lower Body Bathing: Sitting at sink Upper Body Dressing: Supervision/safety Where Assessed-Upper Body Dressing: Sitting at sink Lower Body Dressing: Maximal assistance Where Assessed-Lower Body Dressing: Sitting at sink Toileting: Minimal assistance Where Assessed-Toileting: Teacher, adult education: Moderate assistance Toilet Transfer Method: Stand pivot Toilet Transfer Equipment: Grab bars, Raised toilet seat Exercises: AROM stretches for bilateral UEs: shoulder rolls, shoulder shrugs, shoulder protraction/retraction, horizontal abduction       See Function Navigator for Current Functional Status.   Therapy/Group: Individual  Therapy  Skylor Hughson A Tevon Berhane 07/13/2016, 5:01 PM

## 2016-07-13 NOTE — Progress Notes (Signed)
Wesley Burns [facility] Progress Note  Subjective/Interval Events: UOP 1000 ccs recorded yesterday plus 3 unmeasured occurrence Patient reports increased urine output, no change in lower extremity swelling. He had some shortness of breath overnight, relieved with robaxin, Maalox, and tylenol.  Objective:  I/O last 3 completed shifts: In: 360 [P.O.:360] Out: 1000 [Urine:1000] No intake/output data recorded.   Physical Examination: BP 137/68 (BP Location: Right Arm)   Pulse 76   Temp 98.4 F (36.9 C) (Oral)   Resp 18   Ht 5\' 11"  (1.803 m)   Wt 179 lb 6.4 oz (81.4 kg)   SpO2 95%   BMI 25.02 kg/m   General: resting in bed, comfortable, no acute distress Cardiac: RRR Pulm: clear to auscultation, normal effort Abd: soft, nontender, nondistended Ext: warm and well perfused, pitting edema b/l LE at shins, thighs Neuro: Flat affect   Scheduled Medications: . amLODipine  10 mg Oral Daily  . calcitRIOL  0.25 mcg Oral Daily  . cloNIDine  0.1 mg Oral BID  . darbepoetin (ARANESP) injection - NON-DIALYSIS  100 mcg Subcutaneous Q Mon-1800  . ferrous sulfate  325 mg Oral TID WC  . furosemide  80 mg Oral BID  . heparin  5,000 Units Subcutaneous Q8H  . hydrALAZINE  25 mg Oral Q8H  . insulin aspart  0-9 Units Subcutaneous TID WC  . labetalol  300 mg Oral TID  . lidocaine  1 application Topical Once  . magnesium oxide  400 mg Oral Daily  . pantoprazole  40 mg Oral Daily  . sodium bicarbonate  650 mg Oral TID   PRN Meds:.acetaminophen **OR** acetaminophen, alum & mag hydroxide-simeth, methocarbamol, ondansetron **OR** ondansetron (ZOFRAN) IV, polyethylene glycol, sorbitol, traZODone   Recent Labs Lab 07/08/16 0537 07/09/16 0712 07/10/16 0623 07/11/16 1130 07/12/16 0552  NA 133* 133* 131* 130* 131*  K 4.9 5.3* 4.9 4.9 4.6  CL 110 107 105 106 103  CO2 15* 16* 17* 16* 16*  GLUCOSE 96 99 94 118* 90  BUN 51* 52* 54* 60* 59*  CREATININE 4.60* 4.67* 4.82* 4.81* 4.79*  CALCIUM 8.9 9.0  9.0 9.0 9.2  PHOS 4.5 5.0*  --  5.7*  --      Recent Labs Lab 07/08/16 0537 07/09/16 0712 07/11/16 1130  ALBUMIN 1.8* 1.8* 1.9*     Recent Labs Lab 07/10/16 0623 07/10/16 0817 07/11/16 0628 07/12/16 0552  WBC 10.5 10.3 9.3 9.1  NEUTROABS 7.4  --   --  5.6  HGB 6.5* 6.7* 7.9* 7.5*  HCT 19.5* 20.1* 24.0* 22.7*  MCV 91.1 92.6 90.2 90.4  PLT 399 355 371 339      Recent Labs Lab 07/12/16 0649 07/12/16 1134 07/12/16 1623 07/12/16 2029 07/13/16 0720  GLUCAP 93 142* 101* 109* 91   Results for Wesley, Burns (MRN [name]) as of 07/06/2016 13:21  Ref. 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 He required Nicardipine drip for hypertensive emergency (off drip on 7/31). We were asked to see because of worsening renal function, creatinine increase from 3.78->4.23. Most recent outpt creatinines in the 3's (  and pt had been referred to [facility], but not yet seen)  Assessment/Plan:  CKD 4/5: Worsened\ing renal function since May Burns. Has had proteinuria longstanding, 2.8 grams 08/2013   - creatinine stable 4.81 >> 4.79; am lab pending, will continue to monitor -No clear hard indication for emergent dialysis  - Discontinued ARB with this very low GFR and should not be restarted -  Cannot pursue permanent vascular access yet with recent stroke - will likely need dialysis in the near future -  Sodium bicarbonate 650 mg TID -  Dr. Eliott Burns plans to see him in the office after his discharge from rehab  - Continue Lasix 80 mg po BID for edema  Secondary hyperparathyroidism -  PTH 128  -  Calcitriol 0.25  mcg daily  HTN: Hypertensive crisis requiring Nicardipine drip on admission (off drip since 7/31). -Continue Clonidine 0.1 mg BID -Labetolol 300 TID -amlodipine 10mg  daily -Hydralazine 25 mg tid -Lasix 80 mg po BID -No ACE/ARB -Anticipate some improvement in BP with lasix diuresis  Anemia: - stable, no obvious sign of bleeding. S/p 1 unit PRBC on 8/9. -  Ferrous sulfate 325 mg TID -  Received Feraheme 510 IV X 2 doses, spaced a week apart (received 1st dose 8/2, received 2nd dose 8/9) -  Aranesp 100 mcg/week  -Monitor signs/symptoms of active bleeding  Constipation: prn Miralax.  Gout: Right knee flare this admission s/p aspiration and Depomedrol injection with relief. Stable. Colchicine was added, but now discontinued in light of increased SCr and LFTs. - holding Colchicine  Elevated LFTs: AST/ALT jumped to 285/103 on 8/2 from normal range.  Possibly related to Colchicine.  Abdominal U/S in May Burns showed nodular appearance compatible with hepatic cirrhosis.  Has reported history of EtOH abuse.  Depression/EtOH use: -Neuropsychologist consulted -  EtOH cessation.  Hx T2DM: Hgb A1c 4.5, diet-controlled.  Wesley Burns  8:31 AM IMTS PGY-2  Pt seen, examined and agree w A/P as above.  Wesley 2016 MD Wesley Burns pager [phone]    cell [phone] 07/13/2016, 3:12 PM

## 2016-07-14 ENCOUNTER — Inpatient Hospital Stay (HOSPITAL_COMMUNITY): Payer: 59 | Admitting: Occupational Therapy

## 2016-07-14 ENCOUNTER — Inpatient Hospital Stay (HOSPITAL_COMMUNITY): Payer: 59 | Admitting: Physical Therapy

## 2016-07-14 LAB — OCCULT BLOOD X 1 CARD TO LAB, STOOL: FECAL OCCULT BLD: NEGATIVE

## 2016-07-14 LAB — GLUCOSE, CAPILLARY
GLUCOSE-CAPILLARY: 97 mg/dL (ref 65–99)
GLUCOSE-CAPILLARY: 110 mg/dL — AB (ref 65–99)
Glucose-Capillary: 110 mg/dL — ABNORMAL HIGH (ref 65–99)
Glucose-Capillary: 122 mg/dL — ABNORMAL HIGH (ref 65–99)

## 2016-07-14 MED ORDER — SEVELAMER CARBONATE 800 MG PO TABS
800.0000 mg | ORAL_TABLET | Freq: Three times a day (TID) | ORAL | Status: DC
  Administered 2016-07-14 – 2016-07-19 (×14): 800 mg via ORAL
  Filled 2016-07-14 (×13): qty 1

## 2016-07-14 NOTE — Progress Notes (Signed)
[facility] Progress Note  Subjective/Interval Events: UOP 600 ccs recorded yesterday, patient reports continued increased urinary output Feels his LE edema is a little better  Objective:  I/O last 3 completed shifts: In: 1080 [P.O.:1080] Out: 1150 [Urine:1150] Total I/O In: 120 [P.O.:120] Out: 180 [Urine:180]   Physical Examination: BP (!) 154/75   Pulse 86   Temp 98.3 F (36.8 C) (Oral)   Resp 18   Ht 5\' 11"  (1.803 m)   Wt 181 lb 12.6 oz (82.5 kg)   SpO2 94%   BMI 25.35 kg/m   General: sitting in wheelchair, comfortable, no acute distress Cardiac: RRR Pulm: clear to auscultation, normal effort Abd: soft, nontender, nondistended, +BS Ext: warm and well perfused, pitting edema b/l LE at shins, thighs Neuro: Flat affect   Scheduled Medications: . amLODipine  10 mg Oral Daily  . calcitRIOL  0.25 mcg Oral Daily  . cloNIDine  0.1 mg Oral BID  . darbepoetin (ARANESP) injection - NON-DIALYSIS  100 mcg Subcutaneous Q Mon-1800  . ferrous sulfate  325 mg Oral TID WC  . furosemide  80 mg Oral BID  . heparin  5,000 Units Subcutaneous Q8H  . hydrALAZINE  25 mg Oral Q8H  . insulin aspart  0-9 Units Subcutaneous TID WC  . labetalol  300 mg Oral TID  . lidocaine  1 application Topical Once  . magnesium oxide  400 mg Oral Daily  . pantoprazole  40 mg Oral Daily  . sevelamer carbonate  800 mg Oral TID WC  . sodium bicarbonate  650 mg Oral TID   PRN Meds:.acetaminophen **OR** acetaminophen, alum & mag hydroxide-simeth, methocarbamol, ondansetron **OR** ondansetron (ZOFRAN) IV, polyethylene glycol, sorbitol, traZODone   Recent Labs Lab 07/09/16 0712  07/11/16 1130 07/12/16 0552 07/13/16 0835  NA 133*  < > 130* 131* 129*  K 5.3*  < > 4.9 4.6 4.4  CL 107  < > 106 103 102  CO2 16*  < > 16* 16* 16*  GLUCOSE 99  < > 118* 90 118*  BUN 52*  < > 60* 59* 58*  CREATININE 4.67*  < > 4.81* 4.79* 5.09*  CALCIUM 9.0  < > 9.0 9.2 8.9  PHOS 5.0*  --  5.7*  --  6.1*  < > =  values in this interval not displayed.   Recent Labs Lab 07/09/16 0712 07/11/16 1130 07/13/16 0835  ALBUMIN 1.8* 1.9* 1.9*     Recent Labs Lab 07/10/16 0623 07/10/16 0817 07/11/16 0628 07/12/16 0552  WBC 10.5 10.3 9.3 9.1  NEUTROABS 7.4  --   --  5.6  HGB 6.5* 6.7* 7.9* 7.5*  HCT 19.5* 20.1* 24.0* 22.7*  MCV 91.1 92.6 90.2 90.4  PLT 399 355 371 339      Recent Labs Lab 07/13/16 1142 07/13/16 1628 07/13/16 2037 07/14/16 0630 07/14/16 1127  GLUCAP 132* 119* 129* 97 110*   Results for Wesley Burns, Wesley Burns (MRN Wesley Burns) as of 07/06/2016 13:21  Ref. 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 He required Nicardipine drip for hypertensive emergency (off drip on 7/31).  We were asked to see because of worsening renal function, creatinine increase from 3.78->4.23. 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 Has had proteinuria longstanding, 2.8 grams 08/2013   - creatinine up 4.79 >> 5.09 yesterday; will continue to monitor; BMP in am -No clear hard indication for emergent dialysis  - Discontinued ARB with this very low GFR and should not be restarted -  Cannot pursue permanent vascular access yet with recent stroke - will likely need dialysis in the near future -  Sodium bicarbonate 650 mg TID -  Dr. Eliott Burns plans to see him in the office after his discharge from rehab  - Continue Lasix 80 mg po BID for edema  Secondary hyperparathyroidism -  PTH 128  -  Calcitriol 0.25 mcg  daily -  Add Renvela 800 mg TIDAC   HTN: Hypertensive crisis requiring Nicardipine drip on admission (off drip since 7/31). -Continue Clonidine 0.1 mg BID -Labetolol 300 TID -amlodipine 10mg  daily -Hydralazine 25 mg tid -Lasix 80 mg po BID -No ACE/ARB -Anticipate some improvement in BP with lasix diuresis  Anemia: - stable, no obvious sign of bleeding. S/p 1 unit PRBC on 8/9. -  Ferrous sulfate 325 mg TID -  Received Feraheme 510 IV X 2 doses, spaced a week apart (received 1st dose 8/2, received 2nd dose 8/9) -  Aranesp 100 mcg/week  -Monitor signs/symptoms of active bleeding  Constipation: prn Miralax.  Gout: Right knee flare this admission s/p aspiration and Depomedrol injection with relief. Stable. Colchicine was added, but now discontinued in light of increased SCr and LFTs. - holding Colchicine  Elevated LFTs: AST/ALT jumped to 285/103 on 8/2 from normal range.  Possibly related to Colchicine.  Abdominal U/S in May 2016 showed nodular appearance compatible with hepatic cirrhosis.  Has reported history of EtOH abuse.  Depression/EtOH use: -Neuropsychologist consulted -  EtOH cessation.  Hx T2DM: Hgb A1c 4.5, diet-controlled.  Wesley Burns  12:31 PM IMTS PGY-2   Renal Attending: CKD with worsening fct with BP control and volume removal.  Will cont to support.  AV access on hold due to recent intracranial hemorrhage. Wesley Burns C

## 2016-07-14 NOTE — Progress Notes (Signed)
Occupational Therapy Session Note  Patient Details  Name: Wesley Burns MRN: [id number] Date of Birth: Dec 31, 1965  Today's Date: 07/14/2016 OT Individual Time: 3664-4034 OT Individual Time Calculation (min): 63 min    Skilled Therapeutic Interventions/Progress Updates:   Pt participated in skilled OT intervention focusing on ADL retraining/self sequencing during ADLs. Pt was provided visual aide for safe sequencing of BADLs with success this morning. BADLs completed w/c level at sink with pt consistently Min A for sit<stand and functional transfers when pt provided extra time for power up. Prior to LB bathing, pt reported needing to use the bathroom. Pt ambulated with RW and steadying assistance to toilet. Cues provided for concentrating on eccentric control while lowering onto toilet. After toileting with steadying assistance, pt completed LB dressing with use of AE. Pt provided max encouragement and extra time to complete. LE edema still impedes pts abilities to progress with increasing independence with footwear. TEDs and shoes donned by therapist. After washing hands, pt left in w/c with all needs within reach and LEs elevated. No c/o pain this morning.   2nd Session 1:1 Tx (48 minutes) Pt participated in afternoon session focusing on finding alternative methods for pt to don footwear. Pt completed transfer from w/c to bedroom chair to simulate donning shoes while in favorite chair at home. Pt required max encouragement and extra time with use of adaptive footstool and shoe horn. Pt provided instruction on adaptive setup of left shoe with pt still presenting with increased difficultly. Pt still requires Mod-Max A for footwear. After pt reported needing to use the restroom, and ambulated to bathroom with steadying assistance and RW. Min A for transfer and steadying assistance for tasks. Pt reported increased back/neck and inability to void. "Can I please just use my urinal?" Pt returned to w/c to  void with urinal. Nursing notified regarding pt pain for medication. For remainder of session, pt participated in shoulder AROM exercises with applied heat to decrease pain. Pt reported exercises helped. Due to fatigue, pt returned to bed with all needs within reach for "nap" prior to next therapy session. Pt left in care of nursing at time of skilled OT departure.  Therapy Documentation Precautions:  Precautions Precautions: Fall Precaution Comments: h/o R foot amputation, L charcot foot, bilat THA, falls Restrictions Weight Bearing Restrictions: No General: General PT Missed Treatment Reason: Pain;Patient unwilling to participate;Patient fatigue Vital Signs: Therapy Vitals Temp: 97.8 F (36.6 C) Temp Source: Oral Pulse Rate: 68 Resp: 18 BP: 119/61 Patient Position (if appropriate): Sitting Oxygen Therapy SpO2: 98 % O2 Device: Not Delivered Pain: Stretching, applied hot packs, nursing notification  Pain Assessment Pain Assessment: 0-10 Pain Score: 6  Pain Type: Acute pain Pain Location: Neck Pain Orientation: Posterior Pain Descriptors / Indicators: Aching;Sore Pain Onset: Gradual Pain Intervention(s): Repositioned;Rest ADL: ADL Upper Body Bathing: Supervision/safety Where Assessed-Upper Body Bathing: Sitting at sink Lower Body Bathing: Moderate assistance Where Assessed-Lower Body Bathing: Sitting at sink Upper Body Dressing: Supervision/safety Where Assessed-Upper Body Dressing: Sitting at sink Lower Body Dressing: Maximal assistance Where Assessed-Lower Body Dressing: Sitting at sink Toileting: Minimal assistance Where Assessed-Toileting: Teacher, adult education: Moderate assistance Toilet Transfer Method: Stand pivot Toilet Transfer Equipment: Grab bars, Raised toilet seat Exercises: AROM UB stretches x10 reps: shoulder rolls, shoulder shrugs, shoulder protraction/retraction       See Function Navigator for Current Functional Status.   Therapy/Group:  Individual Therapy  Charnette Younkin A Rhanda Lemire 07/14/2016, 4:12 PM

## 2016-07-14 NOTE — Progress Notes (Signed)
Physical Therapy Session Note  Patient Details  Name: Wesley Burns MRN: [id number] Date of Birth: March 08, 1966  Today's Date: 07/14/2016 PT Individual Time: 1445-1445 PT Individual Time Calculation (min): 0 min    Short Term Goals: Week 1:  PT Short Term Goal 1 (Week 1): Pt will perform bed mobility on flat bed with min A PT Short Term Goal 1 - Progress (Week 1): Met PT Short Term Goal 2 (Week 1): Pt will perform bed <> chair transfers consistently with min A PT Short Term Goal 2 - Progress (Week 1): Not met PT Short Term Goal 3 (Week 1): Pt will perform w/c mobility x 150' with min A for UE strengthening/endurance PT Short Term Goal 3 - Progress (Week 1): Met PT Short Term Goal 4 (Week 1): Pt will negotiate 4 steps with 2 rails and min A PT Short Term Goal 4 - Progress (Week 1): Not met PT Short Term Goal 5 (Week 1): Pt will ambulate x 150' with LRAD and min A PT Short Term Goal 5 - Progress (Week 1): Not met  Skilled Therapeutic Interventions/Progress Updates:    Attempted to see pt in the pm. Pt reluctant to participate with therapy secondary to multiple excuses including pain, fatigue, swelling B LEs and needing to utilize the urinal. Attempted to encourage participation with therapy to utilize the bathroom. Pt continued to refuse due to multiple excuses. Pt educated on benefits of therapy however pt continued to defer treatment. Treatment ended and pt's nurse notified.   Therapy Documentation Precautions:  Precautions Precautions: Fall Precaution Comments: h/o R foot amputation, L charcot foot, bilat THA, falls Restrictions Weight Bearing Restrictions: No General: PT Amount of Missed Time (min): 45 Minutes PT Missed Treatment Reason: Pain;Patient unwilling to participate;Patient fatigue  Pain: Pt c/o 6/10 pain neck and shoulders, pt stated received pain medicine from nursing.   See Function Navigator for Current Functional Status.   Therapy/Group: Individual  Therapy  Dub Amis 07/14/2016, 3:24 PM
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
## 2016-07-15 ENCOUNTER — Inpatient Hospital Stay (HOSPITAL_COMMUNITY): Payer: 59 | Admitting: Occupational Therapy

## 2016-07-15 ENCOUNTER — Inpatient Hospital Stay (HOSPITAL_COMMUNITY): Payer: 59 | Admitting: Physical Therapy

## 2016-07-15 ENCOUNTER — Inpatient Hospital Stay (HOSPITAL_COMMUNITY): Payer: 59 | Admitting: Speech Pathology

## 2016-07-15 ENCOUNTER — Inpatient Hospital Stay (HOSPITAL_COMMUNITY): Payer: 59

## 2016-07-15 DIAGNOSIS — R609 Edema, unspecified: Secondary | ICD-10-CM

## 2016-07-15 DIAGNOSIS — E871 Hypo-osmolality and hyponatremia: Secondary | ICD-10-CM

## 2016-07-15 DIAGNOSIS — R0989 Other specified symptoms and signs involving the circulatory and respiratory systems: Secondary | ICD-10-CM

## 2016-07-15 DIAGNOSIS — R071 Chest pain on breathing: Secondary | ICD-10-CM

## 2016-07-15 DIAGNOSIS — R600 Localized edema: Secondary | ICD-10-CM

## 2016-07-15 DIAGNOSIS — R030 Elevated blood-pressure reading, without diagnosis of hypertension: Secondary | ICD-10-CM

## 2016-07-15 DIAGNOSIS — R0602 Shortness of breath: Secondary | ICD-10-CM

## 2016-07-15 LAB — GLUCOSE, CAPILLARY
GLUCOSE-CAPILLARY: 125 mg/dL — AB (ref 65–99)
GLUCOSE-CAPILLARY: 106 mg/dL — AB (ref 65–99)
Glucose-Capillary: 100 mg/dL — ABNORMAL HIGH (ref 65–99)
Glucose-Capillary: 135 mg/dL — ABNORMAL HIGH (ref 65–99)

## 2016-07-15 LAB — BASIC METABOLIC PANEL
ANION GAP: 12 (ref 5–15)
BUN: 60 mg/dL — AB (ref 6–20)
CHLORIDE: 103 mmol/L (ref 101–111)
CO2: 16 mmol/L — AB (ref 22–32)
Calcium: 9.2 mg/dL (ref 8.9–10.3)
Creatinine, Ser: 5.73 mg/dL — ABNORMAL HIGH (ref 0.61–1.24)
GFR calc Af Amer: 12 mL/min — ABNORMAL LOW (ref >60)
GFR, EST NON AFRICAN AMERICAN: 10 mL/min — AB (ref >60)
GLUCOSE: 135 mg/dL — AB (ref 65–99)
POTASSIUM: 4.5 mmol/L (ref 3.5–5.1)
Sodium: 131 mmol/L — ABNORMAL LOW (ref 135–145)

## 2016-07-15 LAB — CBC WITH DIFFERENTIAL/PLATELET
BASOS ABS: 0 10*3/uL (ref 0.0–0.1)
Basophils Relative: 0 %
EOS PCT: 2 %
Eosinophils Absolute: 0.2 10*3/uL (ref 0.0–0.7)
HEMATOCRIT: 24.7 % — AB (ref 39.0–52.0)
Hemoglobin: 8.1 g/dL — ABNORMAL LOW (ref 13.0–17.0)
LYMPHS ABS: 1.4 10*3/uL (ref 0.7–4.0)
LYMPHS PCT: 17 %
MCH: 30 pg (ref 26.0–34.0)
MCHC: 32.8 g/dL (ref 30.0–36.0)
MCV: 91.5 fL (ref 78.0–100.0)
MONO ABS: 0.9 10*3/uL (ref 0.1–1.0)
MONOS PCT: 11 %
NEUTROS ABS: 5.8 10*3/uL (ref 1.7–7.7)
Neutrophils Relative %: 70 %
PLATELETS: 377 10*3/uL (ref 150–400)
RBC: 2.7 MIL/uL — ABNORMAL LOW (ref 4.22–5.81)
RDW: 17.5 % — AB (ref 11.5–15.5)
WBC: 8.3 10*3/uL (ref 4.0–10.5)

## 2016-07-15 MED ORDER — FUROSEMIDE 80 MG PO TABS
80.0000 mg | ORAL_TABLET | Freq: Once | ORAL | Status: AC
  Administered 2016-07-15: 80 mg via ORAL
  Filled 2016-07-15: qty 1

## 2016-07-15 MED ORDER — FUROSEMIDE 80 MG PO TABS
80.0000 mg | ORAL_TABLET | Freq: Three times a day (TID) | ORAL | Status: DC
  Administered 2016-07-15 (×2): 80 mg via ORAL
  Filled 2016-07-15 (×3): qty 1

## 2016-07-15 NOTE — Progress Notes (Signed)
Called Dr. Amador Cunas with CXR results. Orders to given lasix 80mg  now. A

## 2016-07-15 NOTE — Plan of Care (Signed)
Problem: RH SKIN INTEGRITY Goal: RH STG SKIN FREE OF INFECTION/BREAKDOWN Outcome: Progressing With mod. assist.     
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
## 2016-07-15 NOTE — Progress Notes (Signed)
Patient complained of feeling that,  "right chest has something stuck in it and I can't get it up" and "I can't catch my breath when I cough." B/P-103/53, HR 85, O2 sat 93% RA, temp. 98.9. Dr. Amador Cunas paged and orders received for CXR. Will continue to monitor.Wesley Burns

## 2016-07-15 NOTE — Progress Notes (Signed)
Speech Language Pathology Daily Session Note  Patient Details  Name: Wesley Burns MRN: [id number] Date of Birth: Nov 15, 1966  Today's Date: 07/15/2016 SLP Individual Time: 1000-1100 SLP Individual Time Calculation (min): 60 min   Short Term Goals: Week 2: SLP Short Term Goal 1 (Week 2): Pt will complete familiar functional tasks with supervision verbal cues for functional problem solving.   SLP Short Term Goal 2 (Week 2): Pt will recognize and correct errors in the moment during familiar, functional tasks with supervision verbal cues.   SLP Short Term Goal 3 (Week 2): Pt will utilize external aids to recall daily information with supervision verbal cues.   SLP Short Term Goal 4 (Week 2): Pt will identify 1 physical and 1 cognitive deficit with supervision verbal cues.  Skilled Therapeutic Interventions: Skilled treatment session focused on cognitive goals. SLP facilitated session by administering the MoCA (version 7.3). Patient scored 27/30 points with a score of 26 or above considered normal. Patient demonstrated impairments in immediate recall and attention. However, patient recalled 4/5 words after a 12 minute delay and a unanticipated and serious conversation in regards to possible dialysis with the MD. Patient demonstrated increased engagement and sustained attention to tasks which improved his ability to perform functional tasks. Patient left upright in wheelchair with all needs within reach. Continue with current plan of care.  Function:  Cognition Comprehension Comprehension assist level: Follows basic conversation/direction with no assist  Expression   Expression assist level: Expresses basic needs/ideas: With no assist  Social Interaction Social Interaction assist level: Interacts appropriately 90% of the time - Needs monitoring or encouragement for participation or interaction.  Problem Solving Problem solving assist level: Solves basic 90% of the time/requires cueing < 10% of  the time  Memory Memory assist level: Recognizes or recalls 90% of the time/requires cueing < 10% of the time    Pain Pain Assessment Pain Assessment: 0-10 Pain Score: 6  Pain Type: Acute pain Pain Location: Shoulder Pain Orientation: Right;Left Pain Descriptors / Indicators: Aching Pain Frequency: Intermittent Pain Onset: On-going Patients Stated Pain Goal: 2 Pain Intervention(s): Medication (See eMAR) Multiple Pain Sites: No  Therapy/Group: Individual Therapy  Hiroto Saltzman 07/15/2016, 3:33 PM

## 2016-07-15 NOTE — Progress Notes (Signed)
[facility] Progress Note  Subjective/Interval Events: UOP 1030ccs recorded yesterday Felt SOB overnight, CXR with pulmonary edema   Objective:  I/O last 3 completed shifts: In: 720 [P.O.:720] Out: 1330 [Urine:1330] Total I/O In: 240 [P.O.:240] Out: -    Physical Examination: BP (!) 100/59 (BP Location: Left Arm)   Pulse 82   Temp 98.6 F (37 C) (Oral)   Resp 18   Ht 5\' 11"  (1.803 m)   Wt 181 lb 12.7 oz (82.5 kg)   SpO2 92%   BMI 25.36 kg/m   General: sitting in wheelchair Cardiac: RRR Pulm: mild crackles, normal effort Abd: soft, nontender, nondistended, +BS Ext: pitting edema b/l LE at shins, thighs; s/p right foot partial amputation Neuro: Flat affect   Scheduled Medications: . amLODipine  10 mg Oral Daily  . calcitRIOL  0.25 mcg Oral Daily  . cloNIDine  0.1 mg Oral BID  . darbepoetin (ARANESP) injection - NON-DIALYSIS  100 mcg Subcutaneous Q Mon-1800  . ferrous sulfate  325 mg Oral TID WC  . furosemide  80 mg Oral TID  . heparin  5,000 Units Subcutaneous Q8H  . hydrALAZINE  25 mg Oral Q8H  . insulin aspart  0-9 Units Subcutaneous TID WC  . labetalol  300 mg Oral TID  . lidocaine  1 application Topical Once  . magnesium oxide  400 mg Oral Daily  . pantoprazole  40 mg Oral Daily  . sevelamer carbonate  800 mg Oral TID WC  . sodium bicarbonate  650 mg Oral TID   PRN Meds:.acetaminophen **OR** acetaminophen, alum & mag hydroxide-simeth, methocarbamol, ondansetron **OR** ondansetron (ZOFRAN) IV, polyethylene glycol, sorbitol, traZODone   Recent Labs Lab 07/09/16 0712  07/11/16 1130 07/12/16 0552 07/13/16 0835 07/15/16 1037  NA 133*  < > 130* 131* 129* 131*  K 5.3*  < > 4.9 4.6 4.4 4.5  CL 107  < > 106 103 102 103  CO2 16*  < > 16* 16* 16* 16*  GLUCOSE 99  < > 118* 90 118* 135*  BUN 52*  < > 60* 59* 58* 60*  CREATININE 4.67*  < > 4.81* 4.79* 5.09* 5.73*  CALCIUM 9.0  < > 9.0 9.2 8.9 9.2  PHOS 5.0*  --  5.7*  --  6.1*  --   < > = values in this  interval not displayed.   Recent Labs Lab 07/09/16 0712 07/11/16 1130 07/13/16 0835  ALBUMIN 1.8* 1.9* 1.9*     Recent Labs Lab 07/10/16 09/09/16 07/10/16 0817 07/11/16 0628 07/12/16 0552 07/15/16 1037  WBC 10.5 10.3 9.3 9.1 8.3  NEUTROABS 7.4  --   --  5.6 5.8  HGB 6.5* 6.7* 7.9* 7.5* 8.1*  HCT 19.5* 20.1* 24.0* 22.7* 24.7*  MCV 91.1 92.6 90.2 90.4 91.5  PLT 399 355 371 339 377      Recent Labs Lab 07/14/16 1127 07/14/16 1635 07/14/16 2051 07/15/16 0628 07/15/16 1133  GLUCAP 110* 110* 122* 100* 135*   Results for ERICH, KOCHAN (MRN [name]) as of 07/06/2016 13:21  Ref. 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 He required Nicardipine drip for hypertensive emergency (off drip on 7/31). We were asked to see because of worsening renal function, creatinine increase from 3.78->4.23. 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 Has had proteinuria longstanding, 2.8 grams 08/2013. Normal complements this admission; neg HIV, Hep B, Hep C, ANCA , kappa/lambda ratio 04/2015, repeat urine study: 0-5 rbcs, >300 protein, Pro/Cr 7.78. Normal size kidneys 12.6, 12.3 cm 04/2015).  - creatinine continues to rise 5.09 >> 5.73 - No clear hard indication for emergent dialysis  - Discontinued ARB with this very low GFR and should not be restarted - Will likely need dialysis in the near future, should plan for access placement 4 weeks out from hemorrhagic CVA - Sodium bicarbonate 650 mg  TID - Dr. Eliott Nine plans to see him in the office after his discharge from rehab - Lasix 80 mg BID increased to TID by primary for pulmonary and peripheral edema  Secondary hyperparathyroidism -  PTH 128  -  Calcitriol 0.25 mcg daily -  Renvela 800 mg TIDAC   HTN: Hypertensive crisis requiring Nicardipine drip on admission (off drip since 7/31). Now hypotensive. -Continue Clonidine 0.1 mg BID -Discontinue Labetolol 300 TID -amlodipine 10mg  daily -Discontinue Hydralazine 25 mg tid -Lasix 80 mg po BID increased to TID by primary -No ACE/ARB   Anemia: - stable, no obvious sign of bleeding. S/p 1 unit PRBC on 8/9. -  Ferrous sulfate 325 mg TID -  Received Feraheme 510 IV X 2 doses, spaced a week apart (received 1st dose 8/2, received 2nd dose 8/9) -  Aranesp 100 mcg/week  -Monitor signs/symptoms of active bleeding  Constipation: prn Miralax.  Gout: Right knee flare this admission s/p aspiration and Depomedrol injection with relief. Stable. Colchicine was added, but now discontinued in light of increased SCr and LFTs. - holding Colchicine  Elevated LFTs: AST/ALT jumped to 285/103 on 8/2 from normal range.  Possibly related to Colchicine.  Abdominal U/S in May 2016 showed nodular appearance compatible with hepatic cirrhosis.  Has reported history of EtOH abuse.  Depression/EtOH use: -Neuropsychologist consulted -  EtOH cessation.  Hx T2DM: Hgb A1c 4.5, diet-controlled.  Eryn Marandola  12:00 PM IMTS PGY-2

## 2016-07-15 NOTE — Progress Notes (Signed)
Physical Therapy Session Note  Patient Details  Name: Wesley Burns MRN: [id number] Date of Birth: [birth year]  Today's Date: 07/15/2016 PT Individual Time: 1443-1540 PT Individual Time Calculation (min): 26 min    Short Term Goals: Week 2:  PT Short Term Goal 1 (Week 2): Pt will ambulate 100' with LRAD and close supervision PT Short Term Goal 2 (Week 2): Pt will negotiate 4 steps with 2 rails and min assist PT Short Term Goal 3 (Week 2): Pt will tolerate 5 minutes of cardiovascular activity with no rest breaks   Skilled Therapeutic Interventions/Progress Updates:     Pt received in w/c & agreeable to PT, noting 6/10 B shoulder pain & RN notified. Pt transported room<>gym via w/c total A for time management. Therapist set up w/c, provided multimodal cuing for squat pivot technique and min A to assist pt with squat pivot w/c<>nu-step. Utilized nu-step with BLE only on level 1 x 7 minutes for endurance training & BLE strengthening. At end of session pt left sitting in w/c in room with all needs within reach.  Therapy Documentation Precautions:  Precautions Precautions: Fall Precaution Comments: h/o R foot amputation, L charcot foot, bilat THA, falls Restrictions Weight Bearing Restrictions: No  Pain: Pain Assessment Pain Assessment: 0-10 Pain Score: 6  Pain Location: Shoulder Pain Orientation: Right;Left Pain Intervention(s): RN made aware   See Function Navigator for Current Functional Status.   Therapy/Group: Individual Therapy  Sandi Mariscal 07/15/2016, 1:37 PM

## 2016-07-15 NOTE — Progress Notes (Signed)
Recreational Therapy Session Note  Patient Details  Name: Wesley Burns MRN: [id number] Date of Birth: 30-Jan-1966 Today's Date: 07/15/2016  Pain: no c/o Skilled Therapeutic Interventions/Progress Updates: Pt participated in animal assisted activity/therapy  seated w/c level with supervision.  Pt tearful during visit stating he missed his dog.  Informed pt on pet visitation policy, pt appreciative of this information.  [name] 07/15/2016, 2:46 PM

## 2016-07-15 NOTE — Plan of Care (Signed)
Problem: RH BLADDER ELIMINATION Goal: RH STG MANAGE BLADDER WITH ASSISTANCE STG Manage Bladder With min Assistance   Outcome: Progressing Incontinent of urine. Condom cath at [facility], requiring total assist with hygiene and clean up

## 2016-07-15 NOTE — Plan of Care (Signed)
Problem: RH BOWEL ELIMINATION Goal: RH STG MANAGE BOWEL WITH ASSISTANCE STG Manage Bowel with min Assistance.   Outcome: Not Progressing Incontinent of bowel, requiring total assist with hygiene.

## 2016-07-15 NOTE — Progress Notes (Signed)
Occupational Therapy Session Note  Patient Details  Name: Wesley Burns MRN: [id number] Date of Birth: [birth year]  Today's Date: 07/15/2016 OT Individual Time:  -        Short Term Goals: Week 2:  OT Short Term Goal 1 (Week 2): Pt will complete LB ADLs with Min A OT Short Term Goal 2 (Week 2): Pt will complete bathing with Min A sit<stand OT Short Term Goal 3 (Week 2): Pt will engage in therapeutic or self care related activities for 7 minutes without rest breaks  OT Short Term Goal 4 (Week 2): Pt will complete toilet transfers with RW and supervision OT Short Term Goal 5 (Week 2): Pt will complete sit>stand during LB dressing with Min A   Skilled Therapeutic Interventions/Progress Updates:   Pt participated in skilled OT session focusing on self care mgt retraining and improving self sequencing. Pt was lying supine in bed and was agreeable to get up to complete ADLs. Catheter was in place due to incontinence. Pt completed functional transfer from bed to w/c with steadying assist. UB ADLs completed with min cuing for attending to visual sequencing aide on mirror. Pt will benefit from [facility] sponge when in stock to decrease level of assistance with bathing. After UB ADLs, pt reported needing to void. Pt ambulated into bathroom with Min A and transferred to toilet. Pt removed condom catheter prior to voiding with OT instruction. Toileting completed with balance assistance. Due to time constraints only, pt required additional assistance with LB ADLs while seated on toilet. Due to increased edema in LEs, pt reported shoes as being painful when donning. Pt reports that he will wear socks at home instead of shoes. Pt was provided sock aide and oriented to use. Pt will benefit from trialing use of sock aide at later session. Pt was left in w/c at end of session with all needs within reach. Frequent c/o itchiness in back. Therapist applied lotion and lightly rubbed intermittently during tx to help. Nursing  notified. No c/o pain in shoulders today.      Therapy Documentation Precautions:  Precautions Precautions: Fall Precaution Comments: h/o R foot amputation, L charcot foot, bilat THA, falls Restrictions Weight Bearing Restrictions: No General:   Vital Signs: Therapy Vitals Temp: 97.7 F (36.5 C) Temp Source: Oral Pulse Rate: 62 Resp: 17 BP: 118/61 Patient Position (if appropriate): Sitting Oxygen Therapy SpO2: 98 % O2 Device: Not Delivered  Pain Assessment Pain Assessment: 0-10 Pain Score: 6  Pain Type: Acute pain Pain Location: Shoulder Pain Orientation: Right;Left Pain Descriptors / Indicators: Aching Pain Frequency: Intermittent Pain Onset: On-going Patients Stated Pain Goal: 2 Pain Intervention(s): Medication (See eMAR) Multiple Pain Sites: No ADL: ADL Upper Body Bathing: Supervision/safety Where Assessed-Upper Body Bathing: Sitting at sink Lower Body Bathing: Moderate assistance Where Assessed-Lower Body Bathing: Sitting at sink Upper Body Dressing: Supervision/safety Where Assessed-Upper Body Dressing: Sitting at sink Lower Body Dressing: Maximal assistance Where Assessed-Lower Body Dressing: Sitting at sink Toileting: Minimal assistance Where Assessed-Toileting: Teacher, adult education: Moderate assistance Toilet Transfer Method: Stand pivot Toilet Transfer Equipment: Grab bars, Raised toilet seat     See Function Navigator for Current Functional Status.   Therapy/Group: Individual Therapy  Zenith Lamphier A Letoya Stallone 07/15/2016, 4:57 PM

## 2016-07-16 ENCOUNTER — Inpatient Hospital Stay (HOSPITAL_COMMUNITY): Payer: 59 | Admitting: Speech Pathology

## 2016-07-16 ENCOUNTER — Inpatient Hospital Stay (HOSPITAL_COMMUNITY): Payer: 59 | Admitting: Occupational Therapy

## 2016-07-16 ENCOUNTER — Inpatient Hospital Stay (HOSPITAL_COMMUNITY): Payer: 59

## 2016-07-16 ENCOUNTER — Inpatient Hospital Stay (HOSPITAL_COMMUNITY): Payer: 59 | Admitting: Physical Therapy

## 2016-07-16 DIAGNOSIS — J90 Pleural effusion, not elsewhere classified: Secondary | ICD-10-CM

## 2016-07-16 LAB — GLUCOSE, CAPILLARY
GLUCOSE-CAPILLARY: 91 mg/dL (ref 65–99)
GLUCOSE-CAPILLARY: 133 mg/dL — AB (ref 65–99)
GLUCOSE-CAPILLARY: 107 mg/dL — AB (ref 65–99)
GLUCOSE-CAPILLARY: 126 mg/dL — AB (ref 65–99)

## 2016-07-16 LAB — BLOOD GAS, ARTERIAL
ACID-BASE DEFICIT: 6.9 mmol/L — AB (ref 0.0–2.0)
Bicarbonate: 17.1 mEq/L — ABNORMAL LOW (ref 20.0–24.0)
DRAWN BY: 225631
O2 Content: 2 L/min
O2 Saturation: 96.1 %
PATIENT TEMPERATURE: 98.6
PCO2 ART: 28.9 mmHg — AB (ref 35.0–45.0)
PH ART: 7.39 (ref 7.350–7.450)
TCO2: 18 mmol/L (ref 0–100)
pO2, Arterial: 80.6 mmHg (ref 80.0–100.0)

## 2016-07-16 LAB — BASIC METABOLIC PANEL
ANION GAP: 12 (ref 5–15)
BUN: 63 mg/dL — AB (ref 6–20)
CHLORIDE: 102 mmol/L (ref 101–111)
CO2: 17 mmol/L — ABNORMAL LOW (ref 22–32)
Calcium: 9.2 mg/dL (ref 8.9–10.3)
Creatinine, Ser: 5.63 mg/dL — ABNORMAL HIGH (ref 0.61–1.24)
GFR, EST AFRICAN AMERICAN: 12 mL/min — AB (ref >60)
GFR, EST NON AFRICAN AMERICAN: 10 mL/min — AB (ref >60)
Glucose, Bld: 91 mg/dL (ref 65–99)
POTASSIUM: 4.3 mmol/L (ref 3.5–5.1)
SODIUM: 131 mmol/L — AB (ref 135–145)

## 2016-07-16 LAB — CBC
HCT: 24.5 % — ABNORMAL LOW (ref 39.0–52.0)
HEMOGLOBIN: 8 g/dL — AB (ref 13.0–17.0)
MCH: 30.3 pg (ref 26.0–34.0)
MCHC: 32.7 g/dL (ref 30.0–36.0)
MCV: 92.8 fL (ref 78.0–100.0)
PLATELETS: 346 10*3/uL (ref 150–400)
RBC: 2.64 MIL/uL — AB (ref 4.22–5.81)
RDW: 17.5 % — ABNORMAL HIGH (ref 11.5–15.5)
WBC: 7.8 10*3/uL (ref 4.0–10.5)

## 2016-07-16 MED ORDER — FUROSEMIDE 10 MG/ML IJ SOLN
100.0000 mg | Freq: Two times a day (BID) | INTRAMUSCULAR | Status: DC
  Administered 2016-07-16 – 2016-07-17 (×4): 100 mg via INTRAVENOUS
  Filled 2016-07-16 (×6): qty 10

## 2016-07-16 MED ORDER — METOLAZONE 10 MG PO TABS
10.0000 mg | ORAL_TABLET | Freq: Once | ORAL | Status: AC
Start: 2016-07-16 — End: 2016-07-16
  Administered 2016-07-16: 10 mg via ORAL
  Filled 2016-07-16: qty 1

## 2016-07-16 NOTE — Progress Notes (Signed)
Physical Therapy Session Note  Patient Details  Name: Wesley Burns MRN: [id number] Date of Birth: Jul 21, 1966  Today's Date: 07/16/2016 PT Individual Time: 1315-1355 and 1430-1515 PT Individual Time Calculation (min): 40 min and 45 min    Short Term Goals: Week 2:  PT Short Term Goal 1 (Week 2): Pt will ambulate 100' with LRAD and close supervision PT Short Term Goal 2 (Week 2): Pt will negotiate 4 steps with 2 rails and min assist PT Short Term Goal 3 (Week 2): Pt will tolerate 5 minutes of cardiovascular activity with no rest breaks   Skilled Therapeutic Interventions/Progress Updates:    Treatment 1: Patient in bed eating lunch upon arrival. Patient declined transferring to wheelchair to continue eating and asked for more time. Returned to room after 20 min and patient still eating but requesting to use urinal. Offered for patient to transfer to EOB to use urinal but patient declined and preferred to use urinal in bed. With increased time and coaxing, patient transferred to edge of bed using rail with increased time and supervision and transferred sit > stand using RW with min lifting assist from bed in lowest position. Gait training using RW x 50 ft with supervision. Patient propelled wheelchair x 100 ft using BUE with supervision and increased time with cues for efficient technique and steering as patient tends to drift to L, terminated activity due to increased shoulder pain. Stair training up/down 8 (3") stairs using 2 rails with steady assist for keeping brief from falling down and self-elected step-to pattern. Patient reporting he thinks he would be able to participate better with set schedule, primary OT notified. Patient agreeable to sit up in wheelchair until next therapy session, left sitting in wheelchair with all needs within reach.    Treatment 2: Patient in wheelchair with partner present and CSW arriving, reviewed patient progress to date and DC planning with patient's partner  reporting being gone for long periods of time for work. Discussed need for hiring aid to provide assist when partner out of town/country, verbalized understanding. Patient performed sit <>stand transfers using RW and gait training x 100 ft + 50 ft using RW with supervision. Instructed in BLE therex for strengthening and ROM: seated ankle pumps to fatigue, LAQ 2 x 10 each LE, standing marching x 20 and standing hip extension x 10 each LE using RW.  Patient c/o neck pain, agreeable to sit up in recliner with wheelchair cushion for increased head/neck support and performed ambulatory transfer using RW to recliner with supervision. Patient left sitting with BLE elevated and all needs in reach.   Therapy Documentation Precautions:  Precautions Precautions: Fall Precaution Comments: h/o R foot amputation, L charcot foot, bilat THA, falls Restrictions Weight Bearing Restrictions: No General: PT Amount of Missed Time (min): 20 Minutes PT Missed Treatment Reason: Other (Comment) (lunch) Pain: Pain Assessment Pain Assessment: 0-10 Pain Score: 6  Pain Type: Acute pain Pain Location: Shoulder Pain Orientation: Right Pain Descriptors / Indicators: Aching Pain Frequency: Intermittent Pain Onset: On-going Patients Stated Pain Goal: 2 Pain Intervention(s): Rest;Emotional support Multiple Pain Sites: No   See Function Navigator for Current Functional Status.   Therapy/Group: Individual Therapy  Kerney Elbe 07/16/2016, 2:01 PM
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
## 2016-07-16 NOTE — Progress Notes (Signed)
Occupational Therapy Session Note  Patient Details  Name: Wesley Burns MRN: [id number] Date of Birth: 12/14/1965  Today's Date: 07/16/2016 OT Individual Time: 0930-1040 OT Individual Time Calculation (min): 70 min     Short Term Goals: Week 2:  OT Short Term Goal 1 (Week 2): Pt will complete LB ADLs with Min A OT Short Term Goal 2 (Week 2): Pt will complete bathing with Min A sit<stand OT Short Term Goal 3 (Week 2): Pt will engage in therapeutic or self care related activities for 7 minutes without rest breaks  OT Short Term Goal 4 (Week 2): Pt will complete toilet transfers with RW and supervision OT Short Term Goal 5 (Week 2): Pt will complete sit>stand during LB dressing with Min A   Skilled Therapeutic Interventions/Progress Updates:    Upon entering the room, pt supine in bed with 6/10 generalized pain described as "ache all over." Pt requiring coaxing for participation this session. Pt declined bathing this session. He requested assistance to remove condom catheter. OT provided verbal instructions and materials needed for pt to remove independently. Pt performed supine >sit with mod A for B LE's to EOB. Pt verbalizing need for toileting. Pt attempting to convince therapist to place him onto bed pan and OT educated him on benefits of BSC for toileting purposes. Pt reluctantly agreed and performed stand pivot toilet transfer with mod A for lifting.Pt having large black stool and RN notified. Pt required assistance for clothing management and hygiene . Pt returning to bed in same manner. Bed alarm activated. Call bell and all needed items within reach upon exiting the room.   Therapy Documentation Precautions:  Precautions Precautions: Fall Precaution Comments: h/o R foot amputation, L charcot foot, bilat THA, falls Restrictions Weight Bearing Restrictions: No    See Function Navigator for Current Functional Status.   Therapy/Group: Individual Therapy  Lowella Grip 07/16/2016, 12:45 PM

## 2016-07-16 NOTE — Plan of Care (Signed)
Problem: RH BLADDER ELIMINATION Goal: RH STG MANAGE BLADDER WITH EQUIPMENT WITH ASSISTANCE STG Manage Bladder With Equipment With min Assistance   Outcome: Progressing Condom cath at night

## 2016-07-16 NOTE — Progress Notes (Signed)
[facility] Progress Note  Subjective/Interval Events: Had shortness of breath overnight. This is improved this morning. Continues to have lower extremity edema. UOP 1300ccs recorded yesterday    Objective:  I/O last 3 completed shifts: In: 960 [P.O.:960] Out: 1750 [Urine:1750] No intake/output data recorded.   Physical Examination: BP (!) 145/74 (BP Location: Left Arm)   Pulse 85   Temp 99.2 F (37.3 C) (Tympanic)   Resp 18   Ht 5\' 11"  (1.803 m)   Wt 179 lb 8 oz (81.4 kg)   SpO2 97%   BMI 25.04 kg/m   General: resting in bed Cardiac: RRR Pulm: clear to auscultation, normal effort Abd: soft, nontender, nondistended, +BS Ext: pitting edema b/l LE at shins, thighs; s/p right foot partial amputation Neuro: Flat affect   Scheduled Medications: . amLODipine  10 mg Oral Daily  . calcitRIOL  0.25 mcg Oral Daily  . cloNIDine  0.1 mg Oral BID  . darbepoetin (ARANESP) injection - NON-DIALYSIS  100 mcg Subcutaneous Q Mon-1800  . ferrous sulfate  325 mg Oral TID WC  . furosemide  100 mg Intravenous BID  . heparin  5,000 Units Subcutaneous Q8H  . insulin aspart  0-9 Units Subcutaneous TID WC  . lidocaine  1 application Topical Once  . magnesium oxide  400 mg Oral Daily  . pantoprazole  40 mg Oral Daily  . sevelamer carbonate  800 mg Oral TID WC  . sodium bicarbonate  650 mg Oral TID   PRN Meds:.acetaminophen **OR** acetaminophen, alum & mag hydroxide-simeth, methocarbamol, ondansetron **OR** ondansetron (ZOFRAN) IV, polyethylene glycol, sorbitol, traZODone   Recent Labs Lab 07/11/16 1130  07/13/16 0835 07/15/16 1037 07/16/16 0531  NA 130*  < > 129* 131* 131*  K 4.9  < > 4.4 4.5 4.3  CL 106  < > 102 103 102  CO2 16*  < > 16* 16* 17*  GLUCOSE 118*  < > 118* 135* 91  BUN 60*  < > 58* 60* 63*  CREATININE 4.81*  < > 5.09* 5.73* 5.63*  CALCIUM 9.0  < > 8.9 9.2 9.2  PHOS 5.7*  --  6.1*  --   --   < > = values in this interval not displayed.   Recent Labs Lab  07/11/16 1130 07/13/16 0835  ALBUMIN 1.9* 1.9*     Recent Labs Lab 07/10/16 09/09/16 07/10/16 0817 07/11/16 0628 07/12/16 0552 07/15/16 1037 07/16/16 0531  WBC 10.5 10.3 9.3 9.1 8.3 7.8  NEUTROABS 7.4  --   --  5.6 5.8  --   HGB 6.5* 6.7* 7.9* 7.5* 8.1* 8.0*  HCT 19.5* 20.1* 24.0* 22.7* 24.7* 24.5*  MCV 91.1 92.6 90.2 90.4 91.5 92.8  PLT 399 355 371 339 377 346      Recent Labs Lab 07/15/16 1133 07/15/16 1638 07/15/16 2107 07/16/16 0649 07/16/16 1140  GLUCAP 135* 125* 106* 91 133*   Results for DEANNA, BOEHLKE (MRN [name]) as of 07/06/2016 13:21  Ref. 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 He required Nicardipine drip for hypertensive emergency (off drip on  7/31). We were asked to see because of worsening renal function, creatinine increase from 3.78->4.23. 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 Has had proteinuria longstanding, 2.8 grams 08/2013. Normal complements this admission; neg HIV, Hep B, Hep C, ANCA , kappa/lambda ratio 04/2015, repeat urine study: 0-5 rbcs, >300 protein, Pro/Cr 7.78. Normal size kidneys 12.6, 12.3 cm 04/2015). - creatinine trend 5.09 >> 5.73 >> 5.63 - No clear hard indication for emergent dialysis  - Discontinued ARB with this very low GFR and should not be restarted - Will likely need dialysis in the near future, should plan for access placement 4 weeks out from hemorrhagic CVA - discussed with patient - Sodium bicarbonate 650 mg  TID - Dr. Eliott Nine plans to see him in the office after his discharge from rehab  SOB/Pulmonary edema: - Agree with change in Lasix to 100 mg IV BID - Will give Metolazone 10 mg once today - Monitor daily weights - Strict I/Os  Secondary hyperparathyroidism -  PTH 128  -  Calcitriol 0.25 mcg daily -  Renvela 800 mg TIDAC   HTN: Hypertensive crisis requiring Nicardipine drip on admission (off drip since 7/31).  -Continue Clonidine 0.1 mg BID -amlodipine 10mg  daily -Lasix as above -No ACE/ARB   Anemia: - stable, no obvious sign of bleeding. S/p 1 unit PRBC on 8/9. -  Ferrous sulfate 325 mg TID -  Received Feraheme 510 IV X 2 doses, spaced a week apart (received 1st dose 8/2, received 2nd dose 8/9) -  Aranesp 100 mcg/week  -Monitor signs/symptoms of active bleeding  Constipation: prn Miralax.  Gout: Right knee flare this admission s/p aspiration and Depomedrol injection with relief. Stable. Colchicine was added, but now discontinued in light of increased SCr and LFTs. - holding Colchicine  Elevated LFTs: AST/ALT jumped to 285/103 on 8/2 from normal range.  Possibly related to Colchicine.  Abdominal U/S in May 2016 showed nodular appearance compatible with hepatic cirrhosis.  Has reported history of EtOH abuse.  Depression/EtOH use: -Neuropsychologist consulted -  EtOH cessation.  Hx T2DM: Hgb A1c 4.5, diet-controlled.  Olen Eaves  12:01 PM IMTS PGY-2

## 2016-07-16 NOTE — Progress Notes (Signed)
2345 RN was called to patients room because he said he was having 7 out of 10 pain in his chest when he takes a breath. Pt also reported being SOB. Pt appears SOB when attempting to talk requiring him to stop talking and catch his breath. O2 at 91% room air. Pt was given tylenol and robaxin and repositioned. Catheter bag with of urine and patient stated he did not need to void. Bladder scanned for . Rapid response was on rounds so RN asked for second opinion and was advised to notify MD on call for a possible follow up chest x-ray and ABG's. Jacalyn Lefevre, NP notified and orders received for chest x-ray, ABG's, 2L 02 [place], CBC in AM, and BMP in AM. RN informed patient of new orders and he said "ok, im just trying to get some sleep." Pt voided another of urine, and bladder scanned for 198. Pt states pain as decreased a little bit, but still feels SOB on 2L of 02 although he is at 98%. Will continue to monitor. Royston Cowper, RN

## 2016-07-16 NOTE — Progress Notes (Signed)
Dr. Allena Katz updated on patients status this AM. No new orders. No distress at this time. Royston Cowper, RN

## 2016-07-16 NOTE — Progress Notes (Signed)
Speech Language Pathology Daily Session Note  Patient Details  Name: Wesley Burns MRN: [id number] Date of Birth: 04-09-1966  Today's Date: 07/16/2016 SLP Individual Time: 1105-1200 SLP Individual Time Calculation (min): 55 min   Short Term Goals: Week 2: SLP Short Term Goal 1 (Week 2): Pt will complete familiar functional tasks with supervision verbal cues for functional problem solving.   SLP Short Term Goal 2 (Week 2): Pt will recognize and correct errors in the moment during familiar, functional tasks with supervision verbal cues.   SLP Short Term Goal 3 (Week 2): Pt will utilize external aids to recall daily information with supervision verbal cues.   SLP Short Term Goal 4 (Week 2): Pt will identify 1 physical and 1 cognitive deficit with supervision verbal cues.  Skilled Therapeutic Interventions: Skilled treatment session focused on cognitive goals. Upon arrival, patient was awake while supine in bed but reported fatigue due to recent medications. Patient independently requested to use the urinal but required Total A for problem solving with task which led to patient voiding on his shirt and pants. Patient required Max encouragement to change out of soiled clothes and required an extreme amount of extra time and Max A verbal and tactile cues for initiation with dressing. Patient engaged in a conversation on a personal topic and appeared brighter but demonstrated difficulty in recalling familiar biographical/personal events. Patient left supine in bed with all needs within reach and bed alarm on. Continue with current plan of care.   Function:  Cognition Comprehension Comprehension assist level: Follows basic conversation/direction with no assist  Expression   Expression assist level: Expresses basic needs/ideas: With no assist  Social Interaction Social Interaction assist level: Interacts appropriately 90% of the time - Needs monitoring or encouragement for participation or  interaction.  Problem Solving Problem solving assist level: Solves basic 25 - 49% of the time - needs direction more than half the time to initiate, plan or complete simple activities  Memory Memory assist level: Recognizes or recalls 75 - 89% of the time/requires cueing 10 - 24% of the time    Pain Pain Assessment Pain Assessment: 0-10 Pain Score: 6  Pain Type: Acute pain Pain Location: Shoulder Pain Orientation: Right Pain Descriptors / Indicators: Aching Pain Onset: On-going Pain Intervention(s): Rest;Emotional support  Therapy/Group: Individual Therapy  Juvon Teater 07/16/2016, 3:24 PM

## 2016-07-17 ENCOUNTER — Inpatient Hospital Stay (HOSPITAL_COMMUNITY): Payer: 59 | Admitting: Physical Therapy

## 2016-07-17 ENCOUNTER — Inpatient Hospital Stay (HOSPITAL_COMMUNITY): Payer: 59 | Admitting: Occupational Therapy

## 2016-07-17 ENCOUNTER — Inpatient Hospital Stay (HOSPITAL_COMMUNITY): Payer: 59 | Admitting: Speech Pathology

## 2016-07-17 LAB — GLUCOSE, CAPILLARY
Glucose-Capillary: 99 mg/dL (ref 65–99)
Glucose-Capillary: 139 mg/dL — ABNORMAL HIGH (ref 65–99)
Glucose-Capillary: 145 mg/dL — ABNORMAL HIGH (ref 65–99)

## 2016-07-17 LAB — RENAL FUNCTION PANEL
ANION GAP: 12 (ref 5–15)
Albumin: 2.1 g/dL — ABNORMAL LOW (ref 3.5–5.0)
BUN: 61 mg/dL — ABNORMAL HIGH (ref 6–20)
CO2: 17 mmol/L — AB (ref 22–32)
CREATININE: 5.58 mg/dL — AB (ref 0.61–1.24)
Calcium: 9.3 mg/dL (ref 8.9–10.3)
Chloride: 101 mmol/L (ref 101–111)
GFR calc Af Amer: 12 mL/min — ABNORMAL LOW (ref >60)
GFR calc non Af Amer: 11 mL/min — ABNORMAL LOW (ref >60)
GLUCOSE: 140 mg/dL — AB (ref 65–99)
POTASSIUM: 4.3 mmol/L (ref 3.5–5.1)
Phosphorus: 6.1 mg/dL — ABNORMAL HIGH (ref 2.5–4.6)
Sodium: 130 mmol/L — ABNORMAL LOW (ref 135–145)

## 2016-07-17 MED ORDER — FLUOXETINE HCL 10 MG PO CAPS
10.0000 mg | ORAL_CAPSULE | Freq: Every day | ORAL | Status: DC
  Administered 2016-07-17 – 2016-07-19 (×3): 10 mg via ORAL
  Filled 2016-07-17 (×3): qty 1

## 2016-07-17 MED ORDER — METOLAZONE 10 MG PO TABS
10.0000 mg | ORAL_TABLET | Freq: Every day | ORAL | Status: AC
  Administered 2016-07-17 – 2016-07-19 (×3): 10 mg via ORAL
  Filled 2016-07-17 (×3): qty 1

## 2016-07-17 NOTE — Progress Notes (Signed)
Physical Therapy Session Note  Patient Details  Name: Wesley Burns MRN: [id number] Date of Birth: [birth year]  Today's Date: 07/17/2016 PT Individual Time: 9233-0076 PT Individual Time Calculation (min): 23 min    Short Term Goals: Week 2:  PT Short Term Goal 1 (Week 2): Pt will ambulate 100' with LRAD and close supervision PT Short Term Goal 2 (Week 2): Pt will negotiate 4 steps with 2 rails and min assist PT Short Term Goal 3 (Week 2): Pt will tolerate 5 minutes of cardiovascular activity with no rest breaks   Skilled Therapeutic Interventions/Progress Updates:    Pt sleeping soundly in bed upon arrival, awakes to voice but declines out of bed therapy.  Pt states "I"ve had a day, up and down, up and down all day," but requesting urinal.  PT provided urinal to pt and suggested sitting or standing EOB to use but pt declined.  (+) void, small amount.  Pt suggested pt sit EOB or in w/c or recliner to eat lunch but pt again refused.  Discussed planned d/c for Friday with graduation day being tomorrow.  PT expressed concerns regarding pt not having practiced stairs without rails, not having practiced 6" height steps and discussed potential need for hospital bed at d/c.  Per SW pt and family continue to deny SNF placement at this time.  Pt's significant other planned to come tomorrow for family education.  Pt left semi reclined with tray set up, call bell in reach.   Therapy Documentation Precautions:  Precautions Precautions: Fall Precaution Comments: h/o R foot amputation, L charcot foot, bilat THA, falls Restrictions Weight Bearing Restrictions: No   See Function Navigator for Current Functional Status.   Therapy/Group: Individual Therapy  Ladora Daniel Penven-Crew 07/17/2016, 3:56 PM
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
## 2016-07-17 NOTE — Progress Notes (Signed)
[facility] ROUNDING NOTE   Subjective:   Interval History: continues with diuresis at this point   Objective:  Vital signs in last 24 hours:  Temp:  [98.8 F (37.1 C)-100 F (37.8 C)] 100 F (37.8 C) (08/16 0518) Pulse Rate:  [74-88] 88 (08/16 0518) Resp:  [18] 18 (08/16 0518) BP: (137-161)/(70-77) 161/77 (08/16 0518) SpO2:  [95 %-98 %] 96 % (08/16 0518) Weight:  [77.3 kg (170 lb 6.4 oz)] 77.3 kg (170 lb 6.4 oz) (08/16 0500)  Weight change: -4.128 kg (-9 lb 1.6 oz) Filed Weights   07/15/16 0530 07/16/16 0500 07/17/16 0500  Weight: 82.5 kg (181 lb 12.7 oz) 81.4 kg (179 lb 8 oz) 77.3 kg (170 lb 6.4 oz)    Intake/Output: I/O last 3 completed shifts: In: 720 [P.O.:720] Out: 2000 [Urine:2000]   Intake/Output this shift:  No intake/output data recorded.  General: resting in bed Cardiac: RRR Pulm: clear to auscultation, normal effort Abd: soft, nontender, nondistended, +BS Ext: pitting edema b/l LE at shins, thighs; s/p right foot partial amputation Neuro: Flat affect  Basic Metabolic Panel:  Recent Labs Lab 07/11/16 1130 07/12/16 0552 07/13/16 0835 07/15/16 1037 07/16/16 0531  NA 130* 131* 129* 131* 131*  K 4.9 4.6 4.4 4.5 4.3  CL 106 103 102 103 102  CO2 16* 16* 16* 16* 17*  GLUCOSE 118* 90 118* 135* 91  BUN 60* 59* 58* 60* 63*  CREATININE 4.81* 4.79* 5.09* 5.73* 5.63*  CALCIUM 9.0 9.2 8.9 9.2 9.2  PHOS 5.7*  --  6.1*  --   --     Liver Function Tests:  Recent Labs Lab 07/11/16 1130 07/13/16 0835  ALBUMIN 1.9* 1.9*   No results for input(s): LIPASE, AMYLASE in the last 168 hours. No results for input(s): AMMONIA in the last 168 hours.  CBC:  Recent Labs Lab 07/11/16 0628 07/12/16 0552 07/15/16 1037 07/16/16 0531  WBC 9.3 9.1 8.3 7.8  NEUTROABS  --  5.6 5.8  --   HGB 7.9* 7.5* 8.1* 8.0*  HCT 24.0* 22.7* 24.7* 24.5*  MCV 90.2 90.4 91.5 92.8  PLT 371 339 377 346    Cardiac Enzymes: No results for input(s): CKTOTAL, CKMB,  CKMBINDEX, TROPONINI in the last 168 hours.  BNP: Invalid input(s): POCBNP  CBG:  Recent Labs Lab 07/16/16 0649 07/16/16 1140 07/16/16 1621 07/16/16 2051 07/17/16 0648  GLUCAP 91 133* 107* 126* 99    Microbiology: Results for orders placed or performed during the hospital encounter of 06/28/16  Urine culture     Status: None   Collection Time: 06/28/16  3:20 AM  Result Value Ref Range Status   Specimen Description URINE, RANDOM  Final   Special Requests NONE  Final   Culture NO GROWTH  Final   Report Status 06/29/2016 FINAL  Final  MRSA PCR Screening     Status: None   Collection Time: 06/28/16  5:51 AM  Result Value Ref Range Status   MRSA by PCR NEGATIVE NEGATIVE Final    Comment:        The GeneXpert MRSA Assay (FDA approved for NASAL specimens only), is one component of a comprehensive MRSA colonization surveillance program. It is not intended to diagnose MRSA infection nor to guide or monitor treatment for MRSA infections.   C difficile quick scan w PCR reflex     Status: None   Collection Time: 06/30/16 12:05 PM  Result Value Ref Range Status   C Diff antigen NEGATIVE NEGATIVE Final  C Diff toxin NEGATIVE NEGATIVE Final   C Diff interpretation No C. difficile detected.  Final  Culture, body fluid-bottle     Status: None   Collection Time: 07/01/16  5:00 PM  Result Value Ref Range Status   Specimen Description FLUID SYNOVIAL RIGHT KNEE  Final   Special Requests NONE  Final   Culture NO GROWTH 5 DAYS  Final   Report Status 07/07/2016 FINAL  Final  Gram stain     Status: None   Collection Time: 07/01/16  5:00 PM  Result Value Ref Range Status   Specimen Description FLUID SYNOVIAL RIGHT KNEE  Final   Special Requests NONE  Final   Gram Stain   Final    ABUNDANT WBC PRESENT,BOTH PMN AND MONONUCLEAR NO ORGANISMS SEEN    Report Status 07/02/2016 FINAL  Final    Coagulation Studies: No results for input(s): LABPROT, INR in the last 72  hours.  Urinalysis: No results for input(s): COLORURINE, LABSPEC, PHURINE, GLUCOSEU, HGBUR, BILIRUBINUR, KETONESUR, PROTEINUR, UROBILINOGEN, NITRITE, LEUKOCYTESUR in the last 72 hours.  Invalid input(s): APPERANCEUR    Imaging: Dg Chest Port 1 View  Result Date: 07/16/2016 CLINICAL DATA:  Shortness of breath.  Chest pain. EXAM: PORTABLE CHEST 1 VIEW COMPARISON:  07/15/2016 . FINDINGS: Mediastinum and hilar structures are stable. Stable cardiomegaly. Bibasilar pulmonary infiltrates with small pleural effusions. No pneumothorax. IMPRESSION: 1. Bibasilar pulmonary infiltrates progressed from prior exam. Small bilateral pleural effusions. 2. Stable cardiomegaly. Electronically Signed   By: Maisie Fus  Register   On: 07/16/2016 07:00     Medications:     . amLODipine  10 mg Oral Daily  . calcitRIOL  0.25 mcg Oral Daily  . cloNIDine  0.1 mg Oral BID  . darbepoetin (ARANESP) injection - NON-DIALYSIS  100 mcg Subcutaneous Q Mon-1800  . ferrous sulfate  325 mg Oral TID WC  . FLUoxetine  10 mg Oral Daily  . furosemide  100 mg Intravenous BID  . heparin  5,000 Units Subcutaneous Q8H  . insulin aspart  0-9 Units Subcutaneous TID WC  . lidocaine  1 application Topical Once  . magnesium oxide  400 mg Oral Daily  . metolazone  10 mg Oral Daily  . pantoprazole  40 mg Oral Daily  . sevelamer carbonate  800 mg Oral TID WC  . 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 Has had proteinuria longstanding, 2.8 grams 08/2013. Normal complements this admission; neg HIV, Hep B, Hep C, ANCA , kappa/lambda ratio 04/2015, repeat urine study: 0-5 rbcs, >300 protein, Pro/Cr 7.78. Normal size kidneys 12.6, 12.3 cm 04/2015). 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 Eliott Nine plans to see him in the office after his discharge from rehab  SOB/Pulmonary edema: - Agree with change in Lasix to 100 mg IV BID - Will continue with metolazone  - Monitor daily weights - Strict I/Os  Secondary hyperparathyroidism -  PTH 128  -  Calcitriol 0.25 mcg daily -  Renvela 800 mg TIDAC   HTN:  Hypertensive crisis requiring Nicardipine drip on admission (off drip since 7/31).  -Continue Clonidine 0.1 mg BID -amlodipine 10mg  daily -Lasix as above -No ACE/ARB   Anemia: - stable, no obvious sign of bleeding. S/p 1 unit PRBC on 8/9. -  Ferrous sulfate 325 mg TID -  Received Feraheme 510 IV X 2 doses, spaced a week apart (received 1st dose 8/2, received 2nd dose 8/9) -  Aranesp 100 mcg/week  -Monitor signs/symptoms of active bleeding  Constipation: prn Miralax.  Gout: Right knee flare this admission s/p aspiration and Depomedrol injection with relief. Stable. Colchicine was added, but now discontinued in light of increased SCr and LFTs. - holding Colchicine  Elevated LFTs: AST/ALT jumped to 285/103 on 8/2 from normal range.  Possibly related to Colchicine.  Abdominal U/S in May 2016 showed nodular appearance compatible with hepatic cirrhosis.  Has reported history of EtOH abuse.  Depression/EtOH use: -Neuropsychologist consulted -  EtOH cessation.  Hx T2DM: Hgb A1c 4.5, diet-controlled.     LOS: 13 [place] R[place] W @TODAY @10 :32 AM

## 2016-07-17 NOTE — Progress Notes (Signed)
Speech Language Pathology Daily Session Note  Patient Details  Name: Wesley Burns MRN: [id number] Date of Birth: Oct 22, 1966  Today's Date: 07/17/2016 SLP Individual Time: 1330-1350 SLP Individual Time Calculation (min): 20 min and Today's Date: 07/17/2016 SLP Missed Time: 10 Minutes Missed Time Reason: Patient fatigue    Short Term Goals: Week 2: SLP Short Term Goal 1 (Week 2): Pt will complete familiar functional tasks with supervision verbal cues for functional problem solving.   SLP Short Term Goal 2 (Week 2): Pt will recognize and correct errors in the moment during familiar, functional tasks with supervision verbal cues.   SLP Short Term Goal 3 (Week 2): Pt will utilize external aids to recall daily information with supervision verbal cues.   SLP Short Term Goal 4 (Week 2): Pt will identify 1 physical and 1 cognitive deficit with supervision verbal cues.  Skilled Therapeutic Interventions: Skilled treatment session focused on cognitive goals. Upon arrival, patient was supine in bed and appeared lethargic due to patient reporting he "was up peeing all night" due to IV lasix. SLP facilitated session by attempting to engage patient in a functional conversation that focused on anticipatory awareness. Patient with limited verbal engagement with Max verbal cues needed to maintain arousal and total A for emergent-anticipatory awareness into his current cognitive and physical deficits and their impact on his overall functional independence. Patient missed remaining 10 minutes of session due to fatigue. Patient's progress continues to be limited by fatigue and decreased participation. Patient left supine in bed with all needs within reach. Continue with current plan of care.    Function:  Cognition Comprehension Comprehension assist level: Follows basic conversation/direction with no assist  Expression   Expression assist level: Expresses basic needs/ideas: With no assist  Social Interaction  Social Interaction assist level: Interacts appropriately 25 - 49% of time - Needs frequent redirection.  Problem Solving Problem solving assist level: Solves basic 25 - 49% of the time - needs direction more than half the time to initiate, plan or complete simple activities  Memory Memory assist level: Recognizes or recalls 50 - 74% of the time/requires cueing 25 - 49% of the time    Pain No reports of pain   Therapy/Group: Individual Therapy  Jimi Giza 07/17/2016, 2:08 PM

## 2016-07-17 NOTE — Progress Notes (Signed)
Physical Therapy Session Note  Patient Details  Name: Wesley Burns MRN: [id number] Date of Birth: 30-Aug-1966  Today's Date: 07/17/2016 PT Individual Time: 7867-5449 PT Individual Time Calculation (min): 43 min    Short Term Goals: Week 1:  PT Short Term Goal 1 (Week 1): Pt will perform bed mobility on flat bed with min A PT Short Term Goal 1 - Progress (Week 1): Met PT Short Term Goal 2 (Week 1): Pt will perform bed <> chair transfers consistently with min A PT Short Term Goal 2 - Progress (Week 1): Not met PT Short Term Goal 3 (Week 1): Pt will perform w/c mobility x 150' with min A for UE strengthening/endurance PT Short Term Goal 3 - Progress (Week 1): Met PT Short Term Goal 4 (Week 1): Pt will negotiate 4 steps with 2 rails and min A PT Short Term Goal 4 - Progress (Week 1): Not met PT Short Term Goal 5 (Week 1): Pt will ambulate x 150' with LRAD and min A PT Short Term Goal 5 - Progress (Week 1): Not met Week 2:  PT Short Term Goal 1 (Week 2): Pt will ambulate 100' with LRAD and close supervision PT Short Term Goal 2 (Week 2): Pt will negotiate 4 steps with 2 rails and min assist PT Short Term Goal 3 (Week 2): Pt will tolerate 5 minutes of cardiovascular activity with no rest breaks  Week 3:     Skilled Therapeutic Interventions/Progress Updates:      Patient received asleep in bed. He aroused easily and agreeable to PT. PT assisted patient to don Tedhose supine in bed . Supine to sitting EOB with min A from PT and max cues for increased participation. PT assisted patient to don shoes sitting EOB with max A. Sit<>stand from bed with min A. Gait in room to [facility] for 28f with supervision A and RW. Sit<>stand x 3 for attempt for bladder clearance using condom cath. Patient delcined attempt to use BSC.  WC mobility training with supervision A for 1061fand min cues for increased use of LUE to improve straight trajectory.   Patient returnd to bed with stand pivot transfer with min A  and mod cues for improved use of BUE to control descent.  Sit>supine with mod A for BLE management as well as min cues for proper UE support.   Patient left semi recumbent with lap tray in place for breackfast.     Therapy Documentation Precautions:  Precautions Precautions: Fall Precaution Comments: h/o R foot amputation, L charcot foot, bilat THA, falls Restrictions Weight Bearing Restrictions: No Vital Signs: Therapy Vitals Temp: 100 F (37.8 C) Temp Source: Oral Pulse Rate: 88 Resp: 18 BP: (!) 161/77 Patient Position (if appropriate): Lying Oxygen Therapy SpO2: 96 % O2 Device: Not Delivered Pain: Pain Assessment Pain Assessment: 0-10 Pain Scale:6/10  Pain Type: Acute pain Pain Location: BLE Pain Descriptors / Indicators: Grimacing Pain Frequency: Constant Pain Onset: On-going Patients Stated Pain Goal: 2 Pain Intervention(s): Medication (See eMAR)   See Function Navigator for Current Functional Status.   Therapy/Group: Individual Therapy  AuLorie Phenix/16/2017, 8:46 AM

## 2016-07-17 NOTE — Progress Notes (Signed)
Occupational Therapy Session Note  Patient Details  Name: Wesley Burns MRN: [id number] Date of Birth: 04/22/1966  Today's Date: 07/17/2016 OT Individual Time: 1004-1100 OT Individual Time Calculation (min): 56 min     Short Term Goals: Week 2:  OT Short Term Goal 1 (Week 2): Pt will complete LB ADLs with Min A OT Short Term Goal 2 (Week 2): Pt will complete bathing with Min A sit<stand OT Short Term Goal 3 (Week 2): Pt will engage in therapeutic or self care related activities for 7 minutes without rest breaks  OT Short Term Goal 4 (Week 2): Pt will complete toilet transfers with RW and supervision OT Short Term Goal 5 (Week 2): Pt will complete sit>stand during LB dressing with Min A   Skilled Therapeutic Interventions/Progress Updates:    Upon entering the room, RN present giving medications and pt eating breakfast. He reports that he has been sleeping all morning which is why he is now eating later in the afternoon. Pt needing coaxing for participation. Pt verbalizing he has 6/10 pain in B shoulders and neck which RN gives him medication for during this session. Pt performed supine >sit from elevated HOB with min A and increased time (10 minutes to come to EOB). Once seated on EOB, pt was agreeable to change shirt with set up A. Pt requiring mod cues to initiate tasks while seated on EOB. Once shirt donned, pt reported need to void and attempted to do so while seated onto EOB with urinal. Pt refused going to toilet and standing to urinate. Pt requesting to return to bed as he states, "It is easier to go that way." Pt needing mod A for B LEs in order to return to supine to void. Pt declined further OT intervention at this time and OT continued to provided encouragement and education regarding importance of participation with therapy.   Therapy Documentation Precautions:  Precautions Precautions: Fall Precaution Comments: h/o R foot amputation, L charcot foot, bilat THA,  falls Restrictions Weight Bearing Restrictions: No Exercises:   Other Treatments:    See Function Navigator for Current Functional Status.   Therapy/Group: Individual Therapy  Lowella Grip 07/17/2016, 12:55 PM

## 2016-07-18 ENCOUNTER — Inpatient Hospital Stay (HOSPITAL_COMMUNITY): Payer: 59 | Admitting: Occupational Therapy

## 2016-07-18 ENCOUNTER — Inpatient Hospital Stay (HOSPITAL_COMMUNITY): Payer: 59 | Admitting: Speech Pathology

## 2016-07-18 ENCOUNTER — Inpatient Hospital Stay (HOSPITAL_COMMUNITY): Payer: 59

## 2016-07-18 ENCOUNTER — Inpatient Hospital Stay (HOSPITAL_COMMUNITY): Payer: 59 | Admitting: Physical Therapy

## 2016-07-18 DIAGNOSIS — N185 Chronic kidney disease, stage 5: Secondary | ICD-10-CM

## 2016-07-18 LAB — CBC WITH DIFFERENTIAL/PLATELET
BASOS ABS: 0 10*3/uL (ref 0.0–0.1)
Basophils Relative: 1 %
Eosinophils Absolute: 0.1 10*3/uL (ref 0.0–0.7)
Eosinophils Relative: 2 %
HEMATOCRIT: 24 % — AB (ref 39.0–52.0)
Hemoglobin: 7.9 g/dL — ABNORMAL LOW (ref 13.0–17.0)
LYMPHS PCT: 19 %
Lymphs Abs: 1.5 10*3/uL (ref 0.7–4.0)
MCH: 29.9 pg (ref 26.0–34.0)
MCHC: 32.9 g/dL (ref 30.0–36.0)
MCV: 90.9 fL (ref 78.0–100.0)
Monocytes Absolute: 1.2 10*3/uL — ABNORMAL HIGH (ref 0.1–1.0)
Monocytes Relative: 15 %
NEUTROS ABS: 5.1 10*3/uL (ref 1.7–7.7)
Neutrophils Relative %: 63 %
PLATELETS: 306 10*3/uL (ref 150–400)
RBC: 2.64 MIL/uL — AB (ref 4.22–5.81)
RDW: 16.9 % — ABNORMAL HIGH (ref 11.5–15.5)
WBC: 7.9 10*3/uL (ref 4.0–10.5)

## 2016-07-18 LAB — BASIC METABOLIC PANEL
ANION GAP: 13 (ref 5–15)
BUN: 62 mg/dL — ABNORMAL HIGH (ref 6–20)
CO2: 19 mmol/L — ABNORMAL LOW (ref 22–32)
Calcium: 9.3 mg/dL (ref 8.9–10.3)
Chloride: 100 mmol/L — ABNORMAL LOW (ref 101–111)
Creatinine, Ser: 5.89 mg/dL — ABNORMAL HIGH (ref 0.61–1.24)
GFR, EST AFRICAN AMERICAN: 12 mL/min — AB (ref >60)
GFR, EST NON AFRICAN AMERICAN: 10 mL/min — AB (ref >60)
Glucose, Bld: 93 mg/dL (ref 65–99)
POTASSIUM: 4 mmol/L (ref 3.5–5.1)
SODIUM: 132 mmol/L — AB (ref 135–145)

## 2016-07-18 LAB — GLUCOSE, CAPILLARY
GLUCOSE-CAPILLARY: 125 mg/dL — AB (ref 65–99)
GLUCOSE-CAPILLARY: 146 mg/dL — AB (ref 65–99)
GLUCOSE-CAPILLARY: 119 mg/dL — AB (ref 65–99)
Glucose-Capillary: 97 mg/dL (ref 65–99)
Glucose-Capillary: 140 mg/dL — ABNORMAL HIGH (ref 65–99)

## 2016-07-18 MED ORDER — FUROSEMIDE 80 MG PO TABS
100.0000 mg | ORAL_TABLET | Freq: Two times a day (BID) | ORAL | Status: DC
  Administered 2016-07-18 – 2016-07-19 (×3): 100 mg via ORAL
  Filled 2016-07-18 (×3): qty 1

## 2016-07-18 MED ORDER — TAMSULOSIN HCL 0.4 MG PO CAPS
0.4000 mg | ORAL_CAPSULE | Freq: Every day | ORAL | Status: DC
  Administered 2016-07-18: 0.4 mg via ORAL
  Filled 2016-07-18: qty 1

## 2016-07-18 NOTE — Progress Notes (Signed)
Physical Therapy Session Note  Patient Details  Name: Wesley Burns MRN: [id number] Date of Birth: Oct 17, 1966  Today's Date: 07/18/2016 PT Individual Time: 1130-1200 PT Individual Time Calculation (min): 30 min    Short Term Goals: Week 2:  PT Short Term Goal 1 (Week 2): Pt will ambulate 100' with LRAD and close supervision PT Short Term Goal 2 (Week 2): Pt will negotiate 4 steps with 2 rails and min assist PT Short Term Goal 3 (Week 2): Pt will tolerate 5 minutes of cardiovascular activity with no rest breaks   Skilled Therapeutic Interventions/Progress Updates:    Patient in bed upon arrival. Patient reports partner bought rail last night for home bed, therefore transferred supine > sit with use of rail and HOB flat with min A overall. Threaded shorts and donned socks and shoes with total A for time management. Patient performed sit > stand from bed with max A and from wheelchair with min A using RW. In standing, patient pulled up shorts with steady assist. Gait training using RW x 150 ft with supervision in controlled environment. Patient propelled wheelchair using BUE and intermittent use BLE x 100 ft with supervision. Patient reports home entry from garage has 3 stairs and L rail ascending. Patient instructed in negotiating up/down 3 (6") stairs using L rail using BUE support with min A overall and cues to ascend with LLE and descend RLE due to increased R knee pain. Patient left in wheelchair with all needs in reach.   Therapy Documentation Precautions:  Precautions Precautions: Fall Precaution Comments: h/o R foot amputation, L charcot foot, bilat THA, falls Restrictions Weight Bearing Restrictions: No Pain:  Unrated R knee pain with ambulation, provided rest  See Function Navigator for Current Functional Status.   Therapy/Group: Individual Therapy  Kerney Elbe 07/18/2016, 12:13 PM

## 2016-07-18 NOTE — Progress Notes (Signed)
Patient feels that neither he nor his partner will be able to perform cath every 4-6  Hours due to high volumes. He  prefers to have an indwelling foley at this time.
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
## 2016-07-18 NOTE — Progress Notes (Signed)
Speech Language Pathology Session Note & Discharge Summary  Patient Details  Name: Wesley Burns MRN: [id number] Date of Birth: [birth year]  Today's Date: 07/18/2016 SLP Individual Time: 1300-1345 SLP Individual Time Calculation (min): 45 min  Skilled Therapeutic Interventions:  Skilled treatment session focused on completion of patient and family education. SLP facilitated session by providing verbal education as well as demonstration in regards to strategies to utilize at home to maximize attention, recall and problem solving. Family was also educated on need for 24 hour supervision to maximize safe decisions and overall initiation/motivation in order to complete functional tasks to maximize physical/cognitive activity. All verbalized understanding of all information and handouts were also given to reinforce information. Patient left upright in wheelchair with family present.   Patient has met 1 of 4 long term goals.  Patient to discharge at overall Mod;Max level.   Reasons goals not met: Patient requires Mod-Max A to complete functional and familiar tasks safely   Clinical Impression/Discharge Summary: Patient has made minimal and inconsistent gains and has met 1 of 4 LTG's this admission due to improved recall of information. Currently, patient requires Min A to recall new, basic information and overall Max A for initiation, problem solving, attention and awareness in order to complete functional and familiar tasks safely. Patient's progress was limited by fatigue and decreased participation. Patient and family education is complete and patient will discharge home with supervision from family. Patient would benefit from home health SLP services to maximize cognitive function and overall functional independence in order to reduce caregiver burden.   Care Partner:  Caregiver Able to Provide Assistance: Yes  Type of Caregiver Assistance: Physical;Cognitive  Recommendation:  Home Health SLP;24  hour supervision/assistance  Rationale for SLP Follow Up: Reduce caregiver burden;Maximize cognitive function and independence   Equipment: N/A   Reasons for discharge: Discharged from hospital   Patient/Family Agrees with Progress Made and Goals Achieved: Yes   Function:  Cognition Comprehension Comprehension assist level: Follows basic conversation/direction with no assist  Expression   Expression assist level: Expresses basic needs/ideas: With no assist  Social Interaction Social Interaction assist level: Interacts appropriately 75 - 89% of the time - Needs redirection for appropriate language or to initiate interaction.  Problem Solving Problem solving assist level: Solves basic 25 - 49% of the time - needs direction more than half the time to initiate, plan or complete simple activities  Memory Memory assist level: Recognizes or recalls 75 - 89% of the time/requires cueing 10 - 24% of the time   Zyra Parrillo 07/18/2016, 4:21 PM
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
## 2016-07-18 NOTE — Progress Notes (Signed)
Social Work Patient ID: Wesley Burns, male   DOB: [birth year], 50 y.o.   MRN: [id number]   Have reviewed team conference with pt and partner.  Both aware that we continue to plan for d/c tomorrow.  Partner and 2 aunts in this afternoon for education. Wesley Burns has already gotten all needed DME and I will refer for [facility] follow up.  Phil and sisters are very concerned and guarded about pt's behavior once home (i.e. Being able to stay away from ETOH, motivation to get OOB and do his own self-care, etc.).  They are all being very direct with pt about these concerns.  Will provide pt/partner with SA resources to pursue in the community.  Tamerra Merkley, LCSW

## 2016-07-18 NOTE — Progress Notes (Signed)
Physical Therapy Session Note  Patient Details  Name: Wesley Burns MRN: [id number] Date of Birth: [birth year]  Today's Date: 07/18/2016 PT Individual Time: 1430-1530 PT Individual Time Calculation (min): 60 min    Short Term Goals: Week 2:  PT Short Term Goal 1 (Week 2): Pt will ambulate 100' with LRAD and close supervision PT Short Term Goal 2 (Week 2): Pt will negotiate 4 steps with 2 rails and min assist PT Short Term Goal 3 (Week 2): Pt will tolerate 5 minutes of cardiovascular activity with no rest breaks   Skilled Therapeutic Interventions/Progress Updates:    Session focused on d/c assessment and family education in preparation for discharge tomorrow. Pt's partner, Michele Mcalpine, and 2 aunts were present. Aunts observed and Phil provided hands on assist and cues for patient to participate in session. Reviewed transfers (basic and car transfer), up/down ramp and over mulched surface using RW at steadying assist level, gait in controlled and home environment with supervision using RW, education on progression of HEP and importance of OOB activity and mobility, and stair negotiation training. Pt required overall supervision for basic mobility (transfers and gait) except required min assist for car transfer due to low surface as well as steadying assist on ramp and uneven surfaces due to decreased balance and fatigue. Pt was able to perform stairs with 1 rail this morning with min assist with other PT, so attempted without rails this PM. Pt was unable even with max assist so attempted again with L rail (garage entry has L rail, front entry has no rails) and pt required max A up 3 steps with max verbal cues and required max to total assist to descend stairs and prevent fall due to pt becoming anxious and having difficulty motor planning and following commands of therapist for foot placement. Recommended to Phil, if pt demonstrating difficulty initially, would just bump him up in w/c which Michele Mcalpine states he  has done several times before and plans on just doing this at this time. Handout for HEP issued and reviewed with patient. Set up to finish lunch tray in room at end of session. Family left after about 45 min into the session. Denies any concerns with assist needed and continues to plan for discharge tomorrow.    Therapy Documentation Precautions:  Precautions Precautions: Fall Precaution Comments: h/o R foot amputation, L charcot foot, bilat THA, falls Restrictions Weight Bearing Restrictions: No  Pain:  c/o R knee pain - premedicated.   See Function Navigator for Current Functional Status.   Therapy/Group: Individual Therapy  Karolee Stamps Darrol Poke, PT, DPT  07/18/2016, 3:54 PM

## 2016-07-18 NOTE — Patient Care Conference (Signed)
Inpatient RehabilitationTeam Conference and Plan of Care Update Date: 07/17/2016   Time: 2:35 PM    Patient Name: Wesley Burns      Medical Record Number: [id number]  Date of Birth: [birth year] Sex: Male         Room/Bed: 4W04C/4W04C-01 Payor Info: Payor: Advertising copywriter / Plan: [employer] OTHER / Product Type: *No Product type* /    Admitting Diagnosis: CVA  Admit Date/Time:  07/04/2016  1:57 PM Admission Comments: No comment available   Primary Diagnosis:  Intracerebral hemorrhage (HCC) Principal Problem: Intracerebral hemorrhage [facility])  Patient Active Problem List   Diagnosis Date Noted  . Pleural effusion   . Chest pain on respiration   . Shortness of breath   . Peripheral edema   . Labile blood pressure   . Sleep disturbance   . Left hemiparesis (HCC) 07/04/2016  . Acute on chronic renal failure (HCC)   . Normochromic normocytic anemia   . DM type 2 with diabetic peripheral neuropathy (HCC)   . Hx of amputation   . Adjustment disorder with mixed anxiety and depressed mood   . Marijuana abuse   . Acute blood loss anemia   . Anemia of chronic disease   . Bowel incontinence   . Tobacco abuse   . Low grade fever   . Hypomagnesemia   . Right knee pain   . Diarrhea   . Spasticity   . Nontraumatic acute hemorrhage of basal ganglia (HCC) 06/28/2016  . Intracerebral hemorrhage (HCC) 06/28/2016  . Cytotoxic brain edema (HCC) 06/28/2016  . Brain herniation (HCC) 06/28/2016  . Fracture, shoulder 09/04/2015  . CKD (chronic kidney disease)   . Alcoholic cirrhosis of liver with ascites (HCC)   . Alcohol abuse 04/24/2015  . Hypokalemia 04/21/2015  . Anasarca   . Hyponatremia   . Dislocated hip (HCC) 12/24/2013  . 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 History of total hip replacement 12/24/2013  . Proteinuria 08/31/2013  . Microalbuminuria 07/30/2013  . Charcot foot due to diabetes mellitus (HCC) 07/30/2013  . Noncompliance with  medications 07/30/2013  . Essential hypertension 04/06/2012  . Essential hypertension, benign 04/06/2012    Expected Discharge Date: Expected Discharge Date: 07/19/16  Team Members Present: Physician leading conference: Dr. Maryla Morrow Social Worker Present: Amada Jupiter, LCSW Nurse Present: Ronny Bacon, RN PT Present: Teodoro Kil, PT OT Present: Callie Fielding, OT SLP Present: Feliberto Gottron, SLP PPS Coordinator present : Tora Duck, RN, CRRN     Current Status/Progress Goal Weekly Team Focus  Medical   Left-sided weakness with cognitive deficits secondary to right basal ICH as well as history of TBI 2000  Improve motivation, endurance, mobility, CKD, HTN  See above   Bowel/Bladder   continent of bowel and bladder with a few bladder accidents. No longer wants condom cath. LBM 8/15  remain continent while on rehab   offer assistance with urinal, refrain from condom cath, timed toileting    Swallow/Nutrition/ Hydration             ADL's   min - mod A with self care, min - mod A for sit <>stand and functional transfers, total A for toileting this week secondary to shoulder pain.  downgraded to overall supervision as of 8/8  pt/family edu, self care retraining, endurance, functional transfers   Mobility   Min A overall for transfers and bed mobility. Supervision A for gait up to 119ft with RW, and supervision A for WC management. Patient  continues to be limited by fatigue, pain, and decreased participation  Overall supervision A for gait and transfers with LRAD; Min A for stair management.   Strengthening, endurance, improved participation, increased independence with gait and transfers.    Communication             Safety/Cognition/ Behavioral Observations  Mod-Max A  Min A  family education    Pain   upper back, right chest pain. around a 5-8/10. PRN robaxin helps per patient  <2  assess and treat q shift and prn. look for nonverbal cues of pain    Skin   right shin  with foam where he said the TED hose broke down skin. MASD to buttocks. boggy heels- prevalon boots?   no new skin breakdown while on rehab   assess and monitor for any changed in patients skin integrity     Rehab Goals Patient on target to meet rehab goals: No Rehab Goals Revised: Pt continues with very poor participation and not on track to meet most goals. *See Care Plan and progress notes for long and short-term goals.  Barriers to Discharge: Poor activity tolerance, motivation, transfers, mobility, kidney function, HTN    Possible Resolutions to Barriers:  therapies, nephro following, agressive IV diuresis    Discharge Planning/Teaching Needs:  Home with partner, Michele Mcalpine who is "trying to arrange" 24/7 care but nothing yet confirmed.    Plan teaching on Thursday   Team Discussion:  Worsening chronic issues.  Aggressive diuresis taking place.  Pt continues to be very passive and apathetic about tx sessions.  Pt chooses NOT to do basic ADLs that he is capable of and not on track to meet consistent supervision goals.  MD and tx team do not feel an extension of stay will affect current issues unless pt becomes more self- motivated with personal care and mobility.  Plan to have education with partner tomorrow afternooon.  Revisions to Treatment Plan:  Some may need to be revised if no change in pt's behavior.   Continued Need for Acute Rehabilitation Level of Care: The patient requires daily medical management by a physician with specialized training in physical medicine and rehabilitation for the following conditions: Daily direction of a multidisciplinary physical rehabilitation program to ensure safe treatment while eliciting the highest outcome that is of practical value to the patient.: Yes Daily medical management of patient stability for increased activity during participation in an intensive rehabilitation regime.: Yes Daily analysis of laboratory values and/or radiology reports with any  subsequent need for medication adjustment of medical intervention for : Neurological problems;Blood pressure problems;Renal problems;Mood/behavior problems  Wesley Burns 07/18/2016, 12:57 PM

## 2016-07-18 NOTE — Progress Notes (Signed)
[facility] ROUNDING NOTE   Subjective:   Interval History: great urine output but needing in and out catheter  Objective:  Vital signs in last 24 hours:  Temp:  [98.6 F (37 C)-99.1 F (37.3 C)] 99.1 F (37.3 C) (08/17 0530) Pulse Rate:  [89] 89 (08/16 1312) Resp:  [18] 18 (08/17 0530) BP: (158-159)/(75-78) 158/75 (08/16 2057) SpO2:  [95 %-98 %] 95 % (08/17 0530) Weight:  [83.6 kg (184 lb 6.4 oz)] 83.6 kg (184 lb 6.4 oz) (08/17 0530)  Weight change: 6.35 kg (14 lb) Filed Weights   07/16/16 0500 07/17/16 0500 07/18/16 0530  Weight: 81.4 kg (179 lb 8 oz) 77.3 kg (170 lb 6.4 oz) 83.6 kg (184 lb 6.4 oz)    Intake/Output: I/O last 3 completed shifts: In: 480 [P.O.:480] Out: 3425 [Urine:3425]   Intake/Output this shift:  No intake/output data recorded.  General: resting in bed Cardiac: RRR Pulm: clear to auscultation, normal effort Abd: soft, nontender, nondistended, +BS Ext: pitting edema b/l LE at shins, thighs; s/p right foot partial amputation Neuro: Flat affect   Basic Metabolic Panel:  Recent Labs Lab 07/11/16 1130  07/13/16 0835 07/15/16 1037 07/16/16 0531 07/17/16 1057 07/18/16 0631  NA 130*  < > 129* 131* 131* 130* 132*  K 4.9  < > 4.4 4.5 4.3 4.3 4.0  CL 106  < > 102 103 102 101 100*  CO2 16*  < > 16* 16* 17* 17* 19*  GLUCOSE 118*  < > 118* 135* 91 140* 93  BUN 60*  < > 58* 60* 63* 61* 62*  CREATININE 4.81*  < > 5.09* 5.73* 5.63* 5.58* 5.89*  CALCIUM 9.0  < > 8.9 9.2 9.2 9.3 9.3  PHOS 5.7*  --  6.1*  --   --  6.1*  --   < > = values in this interval not displayed.  Liver Function Tests:  Recent Labs Lab 07/11/16 1130 07/13/16 0835 07/17/16 1057  ALBUMIN 1.9* 1.9* 2.1*   No results for input(s): LIPASE, AMYLASE in the last 168 hours. No results for input(s): AMMONIA in the last 168 hours.  CBC:  Recent Labs Lab 07/12/16 0552 07/15/16 1037 07/16/16 0531 07/18/16 0631  WBC 9.1 8.3 7.8 7.9  NEUTROABS 5.6 5.8  --  5.1   HGB 7.5* 8.1* 8.0* 7.9*  HCT 22.7* 24.7* 24.5* 24.0*  MCV 90.4 91.5 92.8 90.9  PLT 339 377 346 306    Cardiac Enzymes: No results for input(s): CKTOTAL, CKMB, CKMBINDEX, TROPONINI in the last 168 hours.  BNP: Invalid input(s): POCBNP  CBG:  Recent Labs Lab 07/17/16 0648 07/17/16 1121 07/17/16 1622 07/17/16 2110 07/18/16 0645  GLUCAP 99 139* 145* 125* 97    Microbiology: Results for orders placed or performed during the hospital encounter of 06/28/16  Urine culture     Status: None   Collection Time: 06/28/16  3:20 AM  Result Value Ref Range Status   Specimen Description URINE, RANDOM  Final   Special Requests NONE  Final   Culture NO GROWTH  Final   Report Status 06/29/2016 FINAL  Final  MRSA PCR Screening     Status: None   Collection Time: 06/28/16  5:51 AM  Result Value Ref Range Status   MRSA by PCR NEGATIVE NEGATIVE Final    Comment:        The GeneXpert MRSA Assay (FDA approved for NASAL specimens only), is one component of a comprehensive MRSA colonization surveillance program. It is not intended to  diagnose MRSA infection nor to guide or monitor treatment for MRSA infections.   C difficile quick scan w PCR reflex     Status: None   Collection Time: 06/30/16 12:05 PM  Result Value Ref Range Status   C Diff antigen NEGATIVE NEGATIVE Final   C Diff toxin NEGATIVE NEGATIVE Final   C Diff interpretation No C. difficile detected.  Final  Culture, body fluid-bottle     Status: None   Collection Time: 07/01/16  5:00 PM  Result Value Ref Range Status   Specimen Description FLUID SYNOVIAL RIGHT KNEE  Final   Special Requests NONE  Final   Culture NO GROWTH 5 DAYS  Final   Report Status 07/07/2016 FINAL  Final  Gram stain     Status: None   Collection Time: 07/01/16  5:00 PM  Result Value Ref Range Status   Specimen Description FLUID SYNOVIAL RIGHT KNEE  Final   Special Requests NONE  Final   Gram Stain   Final    ABUNDANT WBC PRESENT,BOTH PMN AND  MONONUCLEAR NO ORGANISMS SEEN    Report Status 07/02/2016 FINAL  Final    Coagulation Studies: No results for input(s): LABPROT, INR in the last 72 hours.  Urinalysis: No results for input(s): COLORURINE, LABSPEC, PHURINE, GLUCOSEU, HGBUR, BILIRUBINUR, KETONESUR, PROTEINUR, UROBILINOGEN, NITRITE, LEUKOCYTESUR in the last 72 hours.  Invalid input(s): APPERANCEUR    Imaging: No results found.   Medications:     . amLODipine  10 mg Oral Daily  . calcitRIOL  0.25 mcg Oral Daily  . cloNIDine  0.1 mg Oral BID  . darbepoetin (ARANESP) injection - NON-DIALYSIS  100 mcg Subcutaneous Q Mon-1800  . ferrous sulfate  325 mg Oral TID WC  . FLUoxetine  10 mg Oral Daily  . furosemide  100 mg Oral BID  . heparin  5,000 Units Subcutaneous Q8H  . insulin aspart  0-9 Units Subcutaneous TID WC  . lidocaine  1 application Topical Once  . magnesium oxide  400 mg Oral Daily  . metolazone  10 mg Oral Daily  . pantoprazole  40 mg Oral Daily  . sevelamer carbonate  800 mg Oral TID WC  . 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 Has had proteinuria longstanding, 2.8 grams 08/2013. Normal complements this admission; neg HIV, Hep B, Hep C, ANCA , kappa/lambda ratio 04/2015, repeat urine study: 0-5 rbcs, >300 protein, Pro/Cr 7.78. Normal size kidneys 12.6, 12.3 cm 04/2015). - creatinine trend5.09 >>5.73 >> 5.63  Stable creatinine - No clear hard indication for emergent dialysis  -Discontinued ARB with this very low GFR and should not be restarted - Will likely need dialysis in the near future, should plan for access placement 4 weeks out from hemorrhagic CVA - discussed with patient (  Now at 3 weeks) - Sodium bicarbonate 650 mg TID - Dr. Eliott Nine plans to see him in the  office after his discharge from rehab  SOB/Pulmonary edema: - Agree with change in Lasix to 100 mg IV BID - Will continue with metolazone  - Monitor daily weights - Strict I/Os  Secondary hyperparathyroidism - PTH 128  - Calcitriol 0.25 mcg daily - Renvela 800 mg TIDAC   HTN: Hypertensive crisis requiring Nicardipine drip on admission (off drip since 7/31).  -Continue Clonidine 0.1 mg BID -amlodipine 10mg  daily -  Lasix and metolazone  -No ACE/ARB   Anemia: - stable, no obvious sign of bleeding. S/p 1 unit PRBC on 8/9. - Ferrous sulfate 325 mg TID - Received Feraheme 510 IV X 2 doses, spaced a week apart (received 1st dose 8/2, received 2nd dose 8/9) - Aranesp 100 mcg/week  -Monitor signs/symptoms of active bleeding  Constipation: prn Miralax. Will need stool regimen  Gout: Right knee flare this admission s/p aspiration and Depomedrol injection with relief. Stable. Colchicine was added, but now discontinued in light of increased SCr and LFTs. - holding Colchicine  Elevated LFTs: AST/ALT jumped to 285/103 on 8/2 from normal range.  Possibly related to Colchicine.  Abdominal U/S in May 2016 showed nodular appearance compatible with hepatic cirrhosis.  Has reported history of EtOH abuse.  Depression/EtOH use: -Neuropsychologist consulted - EtOH cessation.  Hx T2DM: Hgb A1c 4.5, diet-controlled.    LOS: 14 Wesley Burns W @TODAY @9 :25 AM

## 2016-07-18 NOTE — Plan of Care (Signed)
Problem: RH Bed Mobility Goal: LTG Patient will perform bed mobility with assist (PT) LTG: Patient will perform bed mobility with assistance, with/without cues (PT).  Outcome: Adequate for Discharge Pt requires min to mod assist   Problem: RH Car Transfers Goal: LTG Patient will perform car transfers with assist (PT) LTG: Patient will perform car transfers with assistance (PT).  Outcome: Adequate for Discharge Requires min assist   Problem: RH Furniture Transfers Goal: LTG Patient will perform furniture transfers w/assist (OT/PT LTG: Patient will perform furniture transfers  with assistance (OT/PT).  Outcome: Adequate for Discharge Requires min assist from lower surfaces  Problem: RH Stairs Goal: LTG Patient will ambulate up and down stairs w/assist (PT) LTG: Patient will ambulate up and down # of stairs with assistance (PT)  Outcome: Not Met (add Reason) Pt unable to complete without rails. One time min assist with L rail, other time max assist

## 2016-07-18 NOTE — Plan of Care (Signed)
Problem: RH Balance Goal: LTG Patient will maintain dynamic standing with ADLs (OT) LTG:  Patient will maintain dynamic standing balance with assist during activities of daily living (OT)   Outcome: Not Met (add Reason) Min A needed for safety  Problem: RH Bathing Goal: LTG Patient will bathe with assist, cues/equipment (OT) LTG: Patient will bathe specified number of body parts with assist with/without cues using equipment (position)  (OT)  Outcome: Not Met (add Reason) Min A needed for safety  Problem: RH Dressing Goal: LTG Patient will perform lower body dressing w/assist (OT) LTG: Patient will perform lower body dressing with assist, with/without cues in positioning using equipment (OT)  Outcome: Not Met (add Reason) Varied levels of assist from min - max A  Problem: RH Tub/Shower Transfers Goal: LTG Patient will perform tub/shower transfers w/assist (OT) LTG: Patient will perform tub/shower transfers with assist, with/without cues using equipment (OT)  Outcome: Not Met (add Reason) Pt declined shower transfer during rehab stay.

## 2016-07-19 DIAGNOSIS — M25562 Pain in left knee: Secondary | ICD-10-CM

## 2016-07-19 DIAGNOSIS — R339 Retention of urine, unspecified: Secondary | ICD-10-CM

## 2016-07-19 LAB — GLUCOSE, CAPILLARY: GLUCOSE-CAPILLARY: 99 mg/dL (ref 65–99)

## 2016-07-19 MED ORDER — AMLODIPINE BESYLATE 10 MG PO TABS: 10.0000 mg | ORAL_TABLET | Freq: Every day | ORAL | 0 refills | Status: AC

## 2016-07-19 MED ORDER — METHOCARBAMOL 500 MG PO TABS: 500.0000 mg | ORAL_TABLET | Freq: Three times a day (TID) | ORAL | 0 refills | Status: DC | PRN

## 2016-07-19 MED ORDER — CALCITRIOL 0.25 MCG PO CAPS: 0.2500 ug | ORAL_CAPSULE | Freq: Every day | ORAL | 0 refills | Status: DC

## 2016-07-19 MED ORDER — SEVELAMER CARBONATE 800 MG PO TABS: 800.0000 mg | ORAL_TABLET | Freq: Three times a day (TID) | ORAL | 0 refills | Status: DC

## 2016-07-19 MED ORDER — CLONIDINE HCL 0.1 MG PO TABS: 0.1000 mg | ORAL_TABLET | Freq: Two times a day (BID) | ORAL | 0 refills | Status: DC

## 2016-07-19 MED ORDER — FLUOXETINE HCL 10 MG PO CAPS: 10.0000 mg | ORAL_CAPSULE | Freq: Every day | ORAL | 0 refills | Status: DC

## 2016-07-19 MED ORDER — FUROSEMIDE 80 MG PO TABS: 160.0000 mg | ORAL_TABLET | Freq: Three times a day (TID) | ORAL | 0 refills | Status: DC

## 2016-07-19 MED ORDER — BETHANECHOL CHLORIDE 10 MG PO TABS
10.0000 mg | ORAL_TABLET | Freq: Three times a day (TID) | ORAL | Status: DC
  Administered 2016-07-19: 10 mg via ORAL
  Filled 2016-07-19: qty 1

## 2016-07-19 MED ORDER — BETHANECHOL CHLORIDE 10 MG PO TABS: 10.0000 mg | ORAL_TABLET | Freq: Three times a day (TID) | ORAL | 0 refills | Status: DC

## 2016-07-19 MED ORDER — FUROSEMIDE 80 MG PO TABS: 160.0000 mg | ORAL_TABLET | Freq: Three times a day (TID) | ORAL | Status: DC

## 2016-07-19 MED ORDER — SODIUM BICARBONATE 650 MG PO TABS: 650.0000 mg | ORAL_TABLET | Freq: Three times a day (TID) | ORAL | 0 refills | Status: DC

## 2016-07-19 MED ORDER — FERROUS SULFATE 325 (65 FE) MG PO TABS: 325.0000 mg | ORAL_TABLET | Freq: Three times a day (TID) | ORAL | 0 refills | Status: DC

## 2016-07-19 MED ORDER — MAGNESIUM OXIDE 400 (241.3 MG) MG PO TABS: 400.0000 mg | ORAL_TABLET | Freq: Every day | ORAL | 0 refills | Status: DC

## 2016-07-19 MED ORDER — PANTOPRAZOLE SODIUM 40 MG PO TBEC: 40.0000 mg | DELAYED_RELEASE_TABLET | Freq: Every day | ORAL | 0 refills | Status: DC

## 2016-07-19 MED ORDER — TAMSULOSIN HCL 0.4 MG PO CAPS: 0.4000 mg | ORAL_CAPSULE | Freq: Every day | ORAL | 0 refills | Status: DC

## 2016-07-19 NOTE — Progress Notes (Signed)
Social Work  Discharge Note  The overall goal for the admission was met for:   Discharge location: Yes - home with partner who will coordinate 24/7 supervision  Length of Stay: Yes - 15 days  Discharge activity level: Yes - supervision overall  Home/community participation: Yes  Services provided included: MD, RD, PT, OT, SLP, RN, TR, Pharmacy, [place]: Private Insurance: [facility]  Follow-up services arranged: Home Health: RN, PT, OT, ST via [place] and Patient/Family has no preference for HH/DME agencies  Comments (or additional information): Discussed and provided written information about SA services that can be accessed locally.  Pt states, "I'm not going to drink anymore."  We discussed that his intentions are correct, however, he may need some assistance in the way of counseling and support.    Patient/Family verbalized understanding of follow-up arrangements: Yes  Individual responsible for coordination of the follow-up plan: pt  Confirmed correct DME delivered:  Pt already had needed DME  Tomer Chalmers

## 2016-07-19 NOTE — Discharge Summary (Signed)
Discharge summary job # [id number]

## 2016-07-19 NOTE — Plan of Care (Signed)
Problem: RH BLADDER ELIMINATION Goal: RH STG MANAGE BLADDER WITH ASSISTANCE STG Manage Bladder With min Assistance   Outcome: Not Progressing Patient having foley cath Goal: RH STG MANAGE BLADDER WITH EQUIPMENT WITH ASSISTANCE STG Manage Bladder With Equipment With min Assistance   Outcome: Not Progressing Patient having foely cath

## 2016-07-19 NOTE — Progress Notes (Signed)
[facility] ROUNDING NOTE   Subjective:   Interval History:  Some right knee pain this am  - discharging to home   Objective:  Vital signs in last 24 hours:  Temp:  [98.1 F (36.7 C)-99.5 F (37.5 C)] 99.5 F (37.5 C) (08/18 0504) Pulse Rate:  [76-80] 80 (08/18 0504) Resp:  [18] 18 (08/18 0504) BP: (139-149)/(68-80) 149/73 (08/18 0504) SpO2:  [92 %-99 %] 92 % (08/18 0504) Weight:  [82.1 kg (181 lb)] 82.1 kg (181 lb) (08/18 0504)  Weight change: -1.543 kg (-3 lb 6.4 oz) Filed Weights   07/17/16 0500 07/18/16 0530 07/19/16 0504  Weight: 77.3 kg (170 lb 6.4 oz) 83.6 kg (184 lb 6.4 oz) 82.1 kg (181 lb)    Intake/Output: I/O last 3 completed shifts: In: 480 [P.O.:480] Out: 4225 [Urine:4225]   Intake/Output this shift:  No intake/output data recorded.  General: resting in bed Cardiac: RRR Pulm: clear to auscultation, normal effort Abd: soft, nontender, nondistended, +BS Ext: pitting edema b/l LE at shins, thighs; s/p right foot partial amputation Neuro: Flat affect   Basic Metabolic Panel:  Recent Labs Lab 07/13/16 0835 07/15/16 1037 07/16/16 0531 07/17/16 1057 07/18/16 0631  NA 129* 131* 131* 130* 132*  K 4.4 4.5 4.3 4.3 4.0  CL 102 103 102 101 100*  CO2 16* 16* 17* 17* 19*  GLUCOSE 118* 135* 91 140* 93  BUN 58* 60* 63* 61* 62*  CREATININE 5.09* 5.73* 5.63* 5.58* 5.89*  CALCIUM 8.9 9.2 9.2 9.3 9.3  PHOS 6.1*  --   --  6.1*  --     Liver Function Tests:  Recent Labs Lab 07/13/16 0835 07/17/16 1057  ALBUMIN 1.9* 2.1*   No results for input(s): LIPASE, AMYLASE in the last 168 hours. No results for input(s): AMMONIA in the last 168 hours.  CBC:  Recent Labs Lab 07/15/16 1037 07/16/16 0531 07/18/16 0631  WBC 8.3 7.8 7.9  NEUTROABS 5.8  --  5.1  HGB 8.1* 8.0* 7.9*  HCT 24.7* 24.5* 24.0*  MCV 91.5 92.8 90.9  PLT 377 346 306    Cardiac Enzymes: No results for input(s): CKTOTAL, CKMB, CKMBINDEX, TROPONINI in the last 168  hours.  BNP: Invalid input(s): POCBNP  CBG:  Recent Labs Lab 07/18/16 0645 07/18/16 1121 07/18/16 1635 07/18/16 2152 07/19/16 0651  GLUCAP 97 146* 140* 119* 99    Microbiology: Results for orders placed or performed during the hospital encounter of 06/28/16  Urine culture     Status: None   Collection Time: 06/28/16  3:20 AM  Result Value Ref Range Status   Specimen Description URINE, RANDOM  Final   Special Requests NONE  Final   Culture NO GROWTH  Final   Report Status 06/29/2016 FINAL  Final  MRSA PCR Screening     Status: None   Collection Time: 06/28/16  5:51 AM  Result Value Ref Range Status   MRSA by PCR NEGATIVE NEGATIVE Final    Comment:        The GeneXpert MRSA Assay (FDA approved for NASAL specimens only), is one component of a comprehensive MRSA colonization surveillance program. It is not intended to diagnose MRSA infection nor to guide or monitor treatment for MRSA infections.   C difficile quick scan w PCR reflex     Status: None   Collection Time: 06/30/16 12:05 PM  Result Value Ref Range Status   C Diff antigen NEGATIVE NEGATIVE Final   C Diff toxin NEGATIVE NEGATIVE Final  C Diff interpretation No C. difficile detected.  Final  Culture, body fluid-bottle     Status: None   Collection Time: 07/01/16  5:00 PM  Result Value Ref Range Status   Specimen Description FLUID SYNOVIAL RIGHT KNEE  Final   Special Requests NONE  Final   Culture NO GROWTH 5 DAYS  Final   Report Status 07/07/2016 FINAL  Final  Gram stain     Status: None   Collection Time: 07/01/16  5:00 PM  Result Value Ref Range Status   Specimen Description FLUID SYNOVIAL RIGHT KNEE  Final   Special Requests NONE  Final   Gram Stain   Final    ABUNDANT WBC PRESENT,BOTH PMN AND MONONUCLEAR NO ORGANISMS SEEN    Report Status 07/02/2016 FINAL  Final    Coagulation Studies: No results for input(s): LABPROT, INR in the last 72 hours.  Urinalysis: No results for input(s):  COLORURINE, LABSPEC, PHURINE, GLUCOSEU, HGBUR, BILIRUBINUR, KETONESUR, PROTEINUR, UROBILINOGEN, NITRITE, LEUKOCYTESUR in the last 72 hours.  Invalid input(s): APPERANCEUR    Imaging: No results found.   Medications:     . amLODipine  10 mg Oral Daily  . bethanechol  10 mg Oral TID  . calcitRIOL  0.25 mcg Oral Daily  . cloNIDine  0.1 mg Oral BID  . darbepoetin (ARANESP) injection - NON-DIALYSIS  100 mcg Subcutaneous Q Mon-1800  . ferrous sulfate  325 mg Oral TID WC  . FLUoxetine  10 mg Oral Daily  . furosemide  100 mg Oral BID  . heparin  5,000 Units Subcutaneous Q8H  . insulin aspart  0-9 Units Subcutaneous TID WC  . lidocaine  1 application Topical Once  . magnesium oxide  400 mg Oral Daily  . pantoprazole  40 mg Oral Daily  . sevelamer carbonate  800 mg Oral TID WC  . sodium bicarbonate  650 mg Oral TID  . 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 Has had proteinuria longstanding, 2.8 grams 08/2013. Normal complements this admission; neg HIV, Hep B, Hep C, ANCA , kappa/lambda ratio 04/2015, repeat urine study: 0-5 rbcs, >300 protein, Pro/Cr 7.78. Normal size kidneys 12.6, 12.3 cm 04/2015). - creatinine trend5.09 >>5.73 >> 5.63  Stable creatinine - No clear hard indication for emergent dialysis  -Discontinued ARB with this very low GFR and should not be restarted - Will likely need dialysis in the near future, should plan for access placement 4 weeks out from hemorrhagic CVA - discussed with patient (  Now at 3 weeks) - Sodium bicarbonate 650 mg TID - Dr. Eliott Nine plans to see him in the office after his discharge from rehab  SOB/Pulmonary edema: - Agree with lasix 160mg  TID - Monitor daily weights - Strict I/Os  Secondary  hyperparathyroidism - PTH 128  - Calcitriol 0.25 mcg daily - Renvela 800 mg TIDAC   HTN: Hypertensive crisis requiring Nicardipine drip on admission (off drip since 7/31).  -Continue Clonidine 0.1 mg BID -amlodipine 10mg  daily - change to oral lasix 160mg  TID  -No ACE/ARB   Anemia: - stable, no obvious sign of bleeding. S/p 1 unit PRBC on 8/9. - Ferrous sulfate 325 mg TID - Received Feraheme 510 IV X 2 doses, spaced a week apart (received 1st dose 8/2, received 2nd dose 8/9) - Aranesp 100 mcg/week  -Monitor signs/symptoms of  active bleeding  Constipation: prn Miralax. Will need stool regimen  Gout: Right knee flare this admission s/p aspiration and Depomedrol injection with relief. Stable. Colchicine was added, but now discontinued in light of increased SCr and LFTs. - holding Colchicine  Elevated LFTs: AST/ALT jumped to 285/103 on 8/2 from normal range.  Possibly related to Colchicine.  Abdominal U/S in May 2016 showed nodular appearance compatible with hepatic cirrhosis.  Has reported history of EtOH abuse.  Depression/EtOH use: -Neuropsychologist consulted - EtOH cessation.  Hx T2DM: Hgb A1c 4.5, diet-controlled.  Follow up [facility] in 1 week   [phone]   LOS: 15 Zia Najera W @TODAY @9 :56 AM
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
## 2016-07-19 NOTE — Discharge Instructions (Signed)
Inpatient Rehab Discharge Instructions  Wesley Burns Discharge date and time: 07/19/16   Activities/Precautions/ Functional Status: Activity: no lifting, driving, or strenuous exercise till cleared by MD.  Diet: Renal diet/Carb modified. Limit fluid intake to 1200cc/ day = 5 cups.   Wound care: None needed Functional status:  ___ No restrictions     ___ Walk up steps independently _X__ 24/7 supervision/assistance   ___ Walk up steps with assistance ___ Intermittent supervision/assistance  ___ Bathe/dress independently ___ Walk with walker     __x_ Bathe/dress with assistance ___ Walk Independently    ___ Shower independently ___ Walk with assistance    ___ Shower with assistance _X__ No alcohol     ___ Return to work/school ________     COMMUNITY REFERRALS UPON DISCHARGE:    Home Health:   PT     OT     ST    RN                       Agency:  [facility] Phone: [phone]    GENERAL COMMUNITY RESOURCES FOR PATIENT/FAMILY:  Mental Health:  *see resource list provided by social worker    Special Instructions: 1. Contact Dr. Allena Katz or Dr. Rennis Chris for steroid injection if you develop gout flare in left knee.  2. Routine foley care--avoid putting bag on floor. .   Plan to discuss dialysis catheter with renal services  My questions have been answered and I understand these instructions. I will adhere to these goals and the provided educational materials after my discharge from the hospital.  Patient/Caregiver Signature _______________________________ Date __________  Clinician Signature _______________________________________ Date __________  Please bring this form and your medication list with you to all your follow-up doctor's appointments.

## 2016-08-22 ENCOUNTER — Other Ambulatory Visit: Payer: Self-pay

## 2016-08-22 DIAGNOSIS — N185 Chronic kidney disease, stage 5: Secondary | ICD-10-CM

## 2016-08-22 DIAGNOSIS — Z01810 Encounter for preprocedural cardiovascular examination: Secondary | ICD-10-CM

## 2016-08-26 ENCOUNTER — Telehealth: Payer: Self-pay | Admitting: Physical Medicine & Rehabilitation

## 2016-08-26 DIAGNOSIS — G8194 Hemiplegia, unspecified affecting left nondominant side: Secondary | ICD-10-CM

## 2016-08-26 DIAGNOSIS — I610 Nontraumatic intracerebral hemorrhage in hemisphere, subcortical: Secondary | ICD-10-CM

## 2016-08-26 NOTE — Telephone Encounter (Signed)
Tomma Lightning PT with [facility] will be discharging patient from home health and patient will need a referral for outpatient therapy.  He would like this through [facility] on [address]

## 2016-08-26 NOTE — Telephone Encounter (Signed)
Referral placed to [facility] [place]

## 2016-08-28 ENCOUNTER — Encounter: Payer: Self-pay | Admitting: Surgery

## 2016-08-28 ENCOUNTER — Encounter: Payer: 59 | Admitting: Physical Medicine & Rehabilitation

## 2016-08-30 ENCOUNTER — Ambulatory Visit (INDEPENDENT_AMBULATORY_CARE_PROVIDER_SITE_OTHER)
Admission: RE | Admit: 2016-08-30 | Discharge: 2016-08-30 | Disposition: A | Discharge status: Home / Self Care | Payer: 59 | Source: Ambulatory Visit | Attending: Vascular Surgery | Admitting: Vascular Surgery

## 2016-08-30 ENCOUNTER — Encounter (HOSPITAL_COMMUNITY): Payer: Self-pay

## 2016-08-30 ENCOUNTER — Ambulatory Visit (HOSPITAL_COMMUNITY)
Admission: RE | Admit: 2016-08-30 | Discharge: 2016-08-30 | Disposition: A | Discharge status: Home / Self Care | Payer: 59 | Source: Ambulatory Visit | Attending: Surgery | Admitting: Surgery

## 2016-08-30 DIAGNOSIS — N185 Chronic kidney disease, stage 5: Secondary | ICD-10-CM

## 2016-08-30 DIAGNOSIS — Z01810 Encounter for preprocedural cardiovascular examination: Secondary | ICD-10-CM | POA: Diagnosis not present

## 2016-09-02 ENCOUNTER — Other Ambulatory Visit (HOSPITAL_COMMUNITY): Payer: 59

## 2016-09-02 ENCOUNTER — Ambulatory Visit: Payer: 59 | Admitting: Surgery

## 2016-09-02 ENCOUNTER — Encounter (HOSPITAL_COMMUNITY): Payer: 59

## 2016-09-02 ENCOUNTER — Encounter (HOSPITAL_COMMUNITY): Payer: Self-pay

## 2016-09-06 ENCOUNTER — Encounter: Payer: Self-pay | Admitting: Vascular Surgery

## 2016-09-06 ENCOUNTER — Ambulatory Visit: Payer: Self-pay | Admitting: Neurology

## 2016-09-09 ENCOUNTER — Encounter: Payer: Self-pay | Admitting: Physical Medicine & Rehabilitation

## 2016-09-09 ENCOUNTER — Ambulatory Visit (HOSPITAL_BASED_OUTPATIENT_CLINIC_OR_DEPARTMENT_OTHER): Payer: 59 | Admitting: Physical Medicine & Rehabilitation

## 2016-09-09 ENCOUNTER — Encounter: Payer: Self-pay | Admitting: Neurology

## 2016-09-09 ENCOUNTER — Encounter: Payer: 59 | Attending: Physical Medicine & Rehabilitation

## 2016-09-09 VITALS — BP 101/69 | HR 79 | Resp 14

## 2016-09-09 DIAGNOSIS — R269 Unspecified abnormalities of gait and mobility: Secondary | ICD-10-CM | POA: Diagnosis not present

## 2016-09-09 DIAGNOSIS — M130 Polyarthritis, unspecified: Secondary | ICD-10-CM

## 2016-09-09 DIAGNOSIS — I69398 Other sequelae of cerebral infarction: Secondary | ICD-10-CM

## 2016-09-09 DIAGNOSIS — I69154 Hemiplegia and hemiparesis following nontraumatic intracerebral hemorrhage affecting left non-dominant side: Secondary | ICD-10-CM | POA: Insufficient documentation

## 2016-09-09 MED ORDER — DICLOFENAC SODIUM 1 % TD GEL: 2.0000 g | Freq: Four times a day (QID) | TRANSDERMAL | 1 refills | Status: DC

## 2016-09-09 NOTE — Patient Instructions (Signed)
Please ask the rheumatologist to drain the right knee
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
## 2016-09-11 ENCOUNTER — Encounter: Payer: 59 | Admitting: Vascular Surgery

## 2016-09-11 ENCOUNTER — Inpatient Hospital Stay (HOSPITAL_COMMUNITY): Admission: RE | Admit: 2016-09-11 | Payer: 59 | Source: Ambulatory Visit

## 2016-09-23 ENCOUNTER — Ambulatory Visit (HOSPITAL_COMMUNITY)
Admission: RE | Admit: 2016-09-23 | Discharge: 2016-09-23 | Disposition: A | Discharge status: Home / Self Care | Payer: 59 | Source: Ambulatory Visit | Attending: Surgery | Admitting: Surgery

## 2016-09-23 DIAGNOSIS — Z01810 Encounter for preprocedural cardiovascular examination: Secondary | ICD-10-CM | POA: Diagnosis present

## 2016-09-23 DIAGNOSIS — N185 Chronic kidney disease, stage 5: Secondary | ICD-10-CM | POA: Diagnosis not present

## 2016-09-24 ENCOUNTER — Encounter: Payer: Self-pay | Admitting: Vascular Surgery

## 2016-09-26 ENCOUNTER — Encounter: Payer: Self-pay | Admitting: Vascular Surgery

## 2016-09-26 ENCOUNTER — Other Ambulatory Visit: Payer: Self-pay

## 2016-09-26 ENCOUNTER — Ambulatory Visit (INDEPENDENT_AMBULATORY_CARE_PROVIDER_SITE_OTHER): Payer: 59 | Admitting: Vascular Surgery

## 2016-09-26 VITALS — BP 138/78 | HR 76 | Temp 96.1°F | Resp 16 | Ht 71.0 in | Wt 153.4 lb

## 2016-09-26 DIAGNOSIS — N184 Chronic kidney disease, stage 4 (severe): Secondary | ICD-10-CM
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
## 2016-09-30 ENCOUNTER — Ambulatory Visit: Payer: 59 | Attending: Physical Medicine & Rehabilitation | Admitting: Rehabilitation

## 2016-09-30 ENCOUNTER — Encounter: Payer: Self-pay | Admitting: Rehabilitation

## 2016-09-30 DIAGNOSIS — I69154 Hemiplegia and hemiparesis following nontraumatic intracerebral hemorrhage affecting left non-dominant side: Secondary | ICD-10-CM | POA: Insufficient documentation

## 2016-09-30 DIAGNOSIS — R2689 Other abnormalities of gait and mobility: Secondary | ICD-10-CM | POA: Insufficient documentation

## 2016-09-30 DIAGNOSIS — R2681 Unsteadiness on feet: Secondary | ICD-10-CM

## 2016-09-30 DIAGNOSIS — M6281 Muscle weakness (generalized): Secondary | ICD-10-CM
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
## 2016-10-01 ENCOUNTER — Encounter (HOSPITAL_COMMUNITY): Payer: Self-pay | Admitting: *Deleted

## 2016-10-02 ENCOUNTER — Encounter (HOSPITAL_COMMUNITY): Payer: Self-pay | Admitting: Certified Registered Nurse Anesthetist

## 2016-10-02 ENCOUNTER — Ambulatory Visit (HOSPITAL_COMMUNITY): Payer: 59

## 2016-10-02 ENCOUNTER — Ambulatory Visit (HOSPITAL_COMMUNITY)
Admission: RE | Admit: 2016-10-02 | Discharge: 2016-10-02 | Disposition: A | Discharge status: Home / Self Care | Payer: 59 | Source: Ambulatory Visit | Attending: Vascular Surgery | Admitting: Vascular Surgery

## 2016-10-02 ENCOUNTER — Ambulatory Visit (HOSPITAL_COMMUNITY): Payer: 59 | Admitting: Emergency Medicine

## 2016-10-02 ENCOUNTER — Encounter (HOSPITAL_COMMUNITY)
Admission: RE | Disposition: A | Discharge status: Home / Self Care | Payer: Self-pay | Source: Ambulatory Visit | Attending: Vascular Surgery

## 2016-10-02 DIAGNOSIS — M109 Gout, unspecified: Secondary | ICD-10-CM | POA: Diagnosis not present

## 2016-10-02 DIAGNOSIS — Z992 Dependence on renal dialysis: Secondary | ICD-10-CM | POA: Diagnosis not present

## 2016-10-02 DIAGNOSIS — Z89431 Acquired absence of right foot: Secondary | ICD-10-CM | POA: Diagnosis not present

## 2016-10-02 DIAGNOSIS — Z87891 Personal history of nicotine dependence: Secondary | ICD-10-CM | POA: Diagnosis not present

## 2016-10-02 DIAGNOSIS — K219 Gastro-esophageal reflux disease without esophagitis: Secondary | ICD-10-CM | POA: Insufficient documentation

## 2016-10-02 DIAGNOSIS — Z8261 Family history of arthritis: Secondary | ICD-10-CM | POA: Insufficient documentation

## 2016-10-02 DIAGNOSIS — Z833 Family history of diabetes mellitus: Secondary | ICD-10-CM | POA: Diagnosis not present

## 2016-10-02 DIAGNOSIS — Z888 Allergy status to other drugs, medicaments and biological substances status: Secondary | ICD-10-CM | POA: Diagnosis not present

## 2016-10-02 DIAGNOSIS — E1122 Type 2 diabetes mellitus with diabetic chronic kidney disease: Secondary | ICD-10-CM | POA: Diagnosis not present

## 2016-10-02 DIAGNOSIS — I120 Hypertensive chronic kidney disease with stage 5 chronic kidney disease or end stage renal disease: Secondary | ICD-10-CM | POA: Diagnosis not present

## 2016-10-02 DIAGNOSIS — Z885 Allergy status to narcotic agent status: Secondary | ICD-10-CM | POA: Insufficient documentation

## 2016-10-02 DIAGNOSIS — Z8249 Family history of ischemic heart disease and other diseases of the circulatory system: Secondary | ICD-10-CM | POA: Diagnosis not present

## 2016-10-02 DIAGNOSIS — Z419 Encounter for procedure for purposes other than remedying health state, unspecified: Secondary | ICD-10-CM

## 2016-10-02 DIAGNOSIS — Z87442 Personal history of urinary calculi: Secondary | ICD-10-CM | POA: Diagnosis not present

## 2016-10-02 DIAGNOSIS — Z96643 Presence of artificial hip joint, bilateral: Secondary | ICD-10-CM | POA: Insufficient documentation

## 2016-10-02 DIAGNOSIS — E11610 Type 2 diabetes mellitus with diabetic neuropathic arthropathy: Secondary | ICD-10-CM | POA: Diagnosis not present

## 2016-10-02 DIAGNOSIS — I69354 Hemiplegia and hemiparesis following cerebral infarction affecting left non-dominant side: Secondary | ICD-10-CM | POA: Insufficient documentation

## 2016-10-02 DIAGNOSIS — N186 End stage renal disease: Secondary | ICD-10-CM | POA: Insufficient documentation

## 2016-10-02 DIAGNOSIS — N185 Chronic kidney disease, stage 5: Secondary | ICD-10-CM | POA: Diagnosis not present

## 2016-10-02 DIAGNOSIS — D631 Anemia in chronic kidney disease: Secondary | ICD-10-CM | POA: Insufficient documentation

## 2016-10-02 HISTORY — PX: INSERTION OF DIALYSIS CATHETER: SHX1324

## 2016-10-02 HISTORY — PX: AV FISTULA PLACEMENT: SHX1204

## 2016-10-02 HISTORY — DX: Cerebral infarction, unspecified: I63.9

## 2016-10-02 LAB — POCT I-STAT 4, (NA,K, GLUC, HGB,HCT)
Glucose, Bld: 93 mg/dL (ref 65–99)
HCT: 23 % — ABNORMAL LOW (ref 39.0–52.0)
Hemoglobin: 7.8 g/dL — ABNORMAL LOW (ref 13.0–17.0)
POTASSIUM: 5.5 mmol/L — AB (ref 3.5–5.1)
SODIUM: 140 mmol/L (ref 135–145)

## 2016-10-02 LAB — COMPREHENSIVE METABOLIC PANEL
ALK PHOS: 242 U/L — AB (ref 38–126)
ALT: 15 U/L — AB (ref 17–63)
AST: 20 U/L (ref 15–41)
Albumin: 2.9 g/dL — ABNORMAL LOW (ref 3.5–5.0)
Anion gap: 12 (ref 5–15)
BILIRUBIN TOTAL: 0.7 mg/dL (ref 0.3–1.2)
BUN: 67 mg/dL — AB (ref 6–20)
CALCIUM: 9.7 mg/dL (ref 8.9–10.3)
CHLORIDE: 107 mmol/L (ref 101–111)
CO2: 19 mmol/L — ABNORMAL LOW (ref 22–32)
CREATININE: 9.89 mg/dL — AB (ref 0.61–1.24)
GFR, EST AFRICAN AMERICAN: 6 mL/min — AB (ref >60)
GFR, EST NON AFRICAN AMERICAN: 5 mL/min — AB (ref >60)
Glucose, Bld: 96 mg/dL (ref 65–99)
Potassium: 5.6 mmol/L — ABNORMAL HIGH (ref 3.5–5.1)
Sodium: 138 mmol/L (ref 135–145)
TOTAL PROTEIN: 6.8 g/dL (ref 6.5–8.1)

## 2016-10-02 LAB — GLUCOSE, CAPILLARY: GLUCOSE-CAPILLARY: 84 mg/dL (ref 65–99)

## 2016-10-02 SURGERY — ARTERIOVENOUS (AV) FISTULA CREATION
Anesthesia: Monitor Anesthesia Care | Site: Neck

## 2016-10-02 MED ORDER — CHLORHEXIDINE GLUCONATE CLOTH 2 % EX PADS: 6.0000 | MEDICATED_PAD | Freq: Once | CUTANEOUS | Status: DC

## 2016-10-02 MED ORDER — KETOROLAC TROMETHAMINE 0.5 % OP SOLN
1.0000 [drp] | Freq: Three times a day (TID) | OPHTHALMIC | Status: DC | PRN
  Filled 2016-10-02: qty 3

## 2016-10-02 MED ORDER — PROTAMINE SULFATE 10 MG/ML IV SOLN
INTRAVENOUS | Status: DC | PRN
  Administered 2016-10-02: 30 mg via INTRAVENOUS

## 2016-10-02 MED ORDER — HEPARIN SODIUM (PORCINE) 1000 UNIT/ML IJ SOLN
INTRAMUSCULAR | Status: AC
  Filled 2016-10-02: qty 1

## 2016-10-02 MED ORDER — PROPOFOL 10 MG/ML IV BOLUS
INTRAVENOUS | Status: AC
  Filled 2016-10-02: qty 20

## 2016-10-02 MED ORDER — ONDANSETRON HCL 4 MG/2ML IJ SOLN
INTRAMUSCULAR | Status: AC
  Filled 2016-10-02: qty 2

## 2016-10-02 MED ORDER — FENTANYL CITRATE (PF) 100 MCG/2ML IJ SOLN
INTRAMUSCULAR | Status: DC | PRN
  Administered 2016-10-02: 25 ug via INTRAVENOUS
  Administered 2016-10-02: 50 ug via INTRAVENOUS
  Administered 2016-10-02: 25 ug via INTRAVENOUS

## 2016-10-02 MED ORDER — LIDOCAINE HCL (PF) 1 % IJ SOLN
INTRAMUSCULAR | Status: AC
  Filled 2016-10-02: qty 30

## 2016-10-02 MED ORDER — PROTAMINE SULFATE 10 MG/ML IV SOLN
INTRAVENOUS | Status: AC
  Filled 2016-10-02: qty 5

## 2016-10-02 MED ORDER — FENTANYL CITRATE (PF) 100 MCG/2ML IJ SOLN
INTRAMUSCULAR | Status: AC
  Filled 2016-10-02: qty 2

## 2016-10-02 MED ORDER — LIDOCAINE HCL (PF) 1 % IJ SOLN
INTRAMUSCULAR | Status: DC | PRN
  Administered 2016-10-02: 30 mL

## 2016-10-02 MED ORDER — PROPOFOL 10 MG/ML IV BOLUS
INTRAVENOUS | Status: DC | PRN
  Administered 2016-10-02: 20 mg via INTRAVENOUS

## 2016-10-02 MED ORDER — MIDAZOLAM HCL 5 MG/5ML IJ SOLN
INTRAMUSCULAR | Status: DC | PRN
  Administered 2016-10-02: 2 mg via INTRAVENOUS

## 2016-10-02 MED ORDER — 0.9 % SODIUM CHLORIDE (POUR BTL) OPTIME
TOPICAL | Status: DC | PRN
  Administered 2016-10-02: 1000 mL

## 2016-10-02 MED ORDER — MIDAZOLAM HCL 2 MG/2ML IJ SOLN
INTRAMUSCULAR | Status: AC
  Filled 2016-10-02: qty 2

## 2016-10-02 MED ORDER — SODIUM CHLORIDE 0.9 % IV SOLN
INTRAVENOUS | Status: DC
  Administered 2016-10-02 (×2): via INTRAVENOUS
  Administered 2016-10-02: 10 mL/h via INTRAVENOUS

## 2016-10-02 MED ORDER — HYDROMORPHONE HCL 1 MG/ML IJ SOLN: 0.2500 mg | INTRAMUSCULAR | Status: DC | PRN

## 2016-10-02 MED ORDER — BSS IO SOLN
15.0000 mL | Freq: Once | INTRAOCULAR | Status: DC
  Filled 2016-10-02: qty 15

## 2016-10-02 MED ORDER — HEPARIN SODIUM (PORCINE) 1000 UNIT/ML IJ SOLN
INTRAMUSCULAR | Status: DC | PRN
  Administered 2016-10-02: 1000 [IU]

## 2016-10-02 MED ORDER — LACTATED RINGERS IV SOLN: INTRAVENOUS | Status: DC | PRN

## 2016-10-02 MED ORDER — HEPARIN SODIUM (PORCINE) 5000 UNIT/ML IJ SOLN
INTRAMUSCULAR | Status: DC | PRN
  Administered 2016-10-02: 500 mL

## 2016-10-02 MED ORDER — PROPOFOL 500 MG/50ML IV EMUL
INTRAVENOUS | Status: DC | PRN
  Administered 2016-10-02: 100 ug/kg/min via INTRAVENOUS

## 2016-10-02 MED ORDER — PROPOFOL 1000 MG/100ML IV EMUL
INTRAVENOUS | Status: AC
  Filled 2016-10-02: qty 100

## 2016-10-02 MED ORDER — HEPARIN SODIUM (PORCINE) 1000 UNIT/ML IJ SOLN
INTRAMUSCULAR | Status: DC | PRN
  Administered 2016-10-02: 3000 [IU] via INTRAVENOUS

## 2016-10-02 MED ORDER — KETAMINE HCL 100 MG/ML IJ SOLN
INTRAMUSCULAR | Status: DC | PRN
  Administered 2016-10-02 (×2): 10 mg via INTRAVENOUS

## 2016-10-02 MED ORDER — DEXTROSE 5 % IV SOLN
INTRAVENOUS | Status: AC
  Filled 2016-10-02: qty 1.5

## 2016-10-02 MED ORDER — ONDANSETRON HCL 4 MG/2ML IJ SOLN
INTRAMUSCULAR | Status: DC | PRN
  Administered 2016-10-02: 4 mg via INTRAVENOUS

## 2016-10-02 MED ORDER — OXYCODONE-ACETAMINOPHEN 5-325 MG PO TABS: 1.0000 | ORAL_TABLET | Freq: Four times a day (QID) | ORAL | 0 refills | Status: DC | PRN

## 2016-10-02 MED ORDER — DEXTROSE 5 % IV SOLN
1.5000 g | INTRAVENOUS | Status: AC
  Administered 2016-10-02: 1.5 g via INTRAVENOUS

## 2016-10-02 MED ORDER — KETAMINE HCL-SODIUM CHLORIDE 100-0.9 MG/10ML-% IV SOSY
PREFILLED_SYRINGE | INTRAVENOUS | Status: AC
  Filled 2016-10-02: qty 10

## 2016-10-02 MED ORDER — HEMOSTATIC AGENTS (NO CHARGE) OPTIME
TOPICAL | Status: DC | PRN
  Administered 2016-10-02: 1 via TOPICAL

## 2016-10-02 SURGICAL SUPPLY — 64 items
ADH SKN CLS APL DERMABOND .7 (GAUZE/BANDAGES/DRESSINGS) ×4
AGENT HMST SPONGE THK3/8 (HEMOSTASIS) ×2
ARMBAND PINK RESTRICT EXTREMIT (MISCELLANEOUS) ×6 IMPLANT
BAG DECANTER FOR FLEXI CONT (MISCELLANEOUS) ×3 IMPLANT
BIOPATCH RED 1 DISK 7.0 (GAUZE/BANDAGES/DRESSINGS) ×3 IMPLANT
CANISTER SUCTION 2500CC (MISCELLANEOUS) ×3 IMPLANT
CANNULA VESSEL 3MM 2 BLNT TIP (CANNULA) ×3 IMPLANT
CATH PALINDROME RT-P 15FX19CM (CATHETERS) IMPLANT
CATH PALINDROME RT-P 15FX23CM (CATHETERS) ×1 IMPLANT
CATH PALINDROME RT-P 15FX28CM (CATHETERS) IMPLANT
CATH PALINDROME RT-P 15FX55CM (CATHETERS) IMPLANT
CATH STRAIGHT 5FR 65CM (CATHETERS) IMPLANT
CHLORAPREP W/TINT 26ML (MISCELLANEOUS) ×3 IMPLANT
CLIP TI MEDIUM 6 (CLIP) ×3 IMPLANT
CLIP TI WIDE RED SMALL 6 (CLIP) ×4 IMPLANT
COVER PROBE W GEL 5X96 (DRAPES) IMPLANT
COVER SURGICAL LIGHT HANDLE (MISCELLANEOUS) ×3 IMPLANT
DECANTER SPIKE VIAL GLASS SM (MISCELLANEOUS) ×3 IMPLANT
DERMABOND ADVANCED (GAUZE/BANDAGES/DRESSINGS) ×2
DERMABOND ADVANCED .7 DNX12 (GAUZE/BANDAGES/DRESSINGS) ×2 IMPLANT
DRAIN PENROSE 1/4X12 LTX STRL (WOUND CARE) ×3 IMPLANT
DRAPE C-ARM 42X72 X-RAY (DRAPES) ×3 IMPLANT
DRAPE CHEST BREAST 15X10 FENES (DRAPES) ×3 IMPLANT
DRSG COVADERM 4X6 (GAUZE/BANDAGES/DRESSINGS) ×1 IMPLANT
ELECT REM PT RETURN 9FT ADLT (ELECTROSURGICAL) ×3
ELECTRODE REM PT RTRN 9FT ADLT (ELECTROSURGICAL) ×2 IMPLANT
GAUZE SPONGE 2X2 8PLY STRL LF (GAUZE/BANDAGES/DRESSINGS) ×2 IMPLANT
GAUZE SPONGE 4X4 16PLY XRAY LF (GAUZE/BANDAGES/DRESSINGS) ×3 IMPLANT
GLOVE BIO SURGEON STRL SZ 6.5 (GLOVE) ×3 IMPLANT
GLOVE BIO SURGEON STRL SZ7.5 (GLOVE) ×3 IMPLANT
GLOVE BIOGEL PI IND STRL 6.5 (GLOVE) IMPLANT
GLOVE BIOGEL PI INDICATOR 6.5 (GLOVE) ×4
GLOVE SKINSENSE STRL SZ6.0 (GLOVE) ×1 IMPLANT
GOWN STRL REUS W/ TWL LRG LVL3 (GOWN DISPOSABLE) ×6 IMPLANT
GOWN STRL REUS W/TWL LRG LVL3 (GOWN DISPOSABLE) ×9
HEMOSTAT SPONGE AVITENE ULTRA (HEMOSTASIS) ×1 IMPLANT
KIT BASIN OR (CUSTOM PROCEDURE TRAY) ×3 IMPLANT
KIT ROOM TURNOVER OR (KITS) ×3 IMPLANT
LOOP VESSEL MINI RED (MISCELLANEOUS) IMPLANT
NDL 18GX1X1/2 (RX/OR ONLY) (NEEDLE) ×2 IMPLANT
NDL HYPO 25GX1X1/2 BEV (NEEDLE) ×2 IMPLANT
NEEDLE 18GX1X1/2 (RX/OR ONLY) (NEEDLE) ×3 IMPLANT
NEEDLE HYPO 25GX1X1/2 BEV (NEEDLE) ×3 IMPLANT
NS IRRIG 1000ML POUR BTL (IV SOLUTION) ×3 IMPLANT
PACK CV ACCESS (CUSTOM PROCEDURE TRAY) ×3 IMPLANT
PACK SURGICAL SETUP 50X90 (CUSTOM PROCEDURE TRAY) ×3 IMPLANT
PAD ARMBOARD 7.5X6 YLW CONV (MISCELLANEOUS) ×6 IMPLANT
SET MICROPUNCTURE 5F STIFF (MISCELLANEOUS) IMPLANT
SPONGE GAUZE 2X2 STER 10/PKG (GAUZE/BANDAGES/DRESSINGS) ×1
SPONGE SURGIFOAM ABS GEL 100 (HEMOSTASIS) IMPLANT
SUT ETHILON 3 0 PS 1 (SUTURE) ×3 IMPLANT
SUT PROLENE 6 0 CC (SUTURE) ×2 IMPLANT
SUT PROLENE 7 0 BV 1 (SUTURE) ×3 IMPLANT
SUT SILK 0 FSL (SUTURE) IMPLANT
SUT VIC AB 3-0 SH 27 (SUTURE) ×6
SUT VIC AB 3-0 SH 27X BRD (SUTURE) ×2 IMPLANT
SUT VICRYL 4-0 PS2 18IN ABS (SUTURE) ×3 IMPLANT
SYR 20CC LL (SYRINGE) ×6 IMPLANT
SYR 5ML LL (SYRINGE) ×3 IMPLANT
SYR CONTROL 10ML LL (SYRINGE) ×3 IMPLANT
SYRINGE 10CC LL (SYRINGE) ×3 IMPLANT
UNDERPAD 30X30 (UNDERPADS AND DIAPERS) ×3 IMPLANT
WATER STERILE IRR 1000ML POUR (IV SOLUTION) ×3 IMPLANT
WIRE AMPLATZ SS-J .035X180CM (WIRE) IMPLANT

## 2016-10-02 NOTE — Discharge Instructions (Signed)
° ° °  10/02/2016 Wesley Burns [id number] Nov 17, 1966  Surgeon(s): Sherren Kerns, MD  Procedure(s): ARTERIOVENOUS (AV) FISTULA CREATION INSERTION OF DIALYSIS CATHETER  x Do not stick fistula for 12 weeks

## 2016-10-02 NOTE — OR Nursing (Signed)
Pt c/o "burning and scratchy" feeling to Right eye.  Dr. Maple Hudson notified.

## 2016-10-02 NOTE — Transfer of Care (Addendum)
Immediate Anesthesia Transfer of Care Note  Patient: Wesley Burns  Procedure(s) Performed: Procedure(s): Left Arm ARTERIOVENOUS (AV) FISTULA CREATION (Left) INSERTION OF DIALYSIS CATHETER (N/A)  Patient Location: PACU  Anesthesia Type:MAC  Level of Consciousness: awake, sedated and patient cooperative  Airway & Oxygen Therapy: Patient Spontanous Breathing and Patient connected to face mask oxygen  Post-op Assessment: Report given to RN and Post -op Vital signs reviewed and stable  Post vital signs: Reviewed and stable  Last Vitals:  Vitals:   10/02/16 1239  BP: (!) 158/83  Pulse: 73  Resp: 16  Temp: 36.4 C    Last Pain:  Vitals:   10/02/16 1239  TempSrc: Oral  PainSc:       Patients Stated Pain Goal: 3 (10/02/16 1229)  Complications: No apparent anesthesia complications

## 2016-10-02 NOTE — Anesthesia Procedure Notes (Signed)
Procedure Name: MAC Date/Time: 10/02/2016 4:05 PM Performed by: Tillman Abide Pre-anesthesia Checklist: Patient identified, Emergency Drugs available, Suction available and Patient being monitored Patient Re-evaluated:Patient Re-evaluated prior to inductionOxygen Delivery Method: Simple face mask

## 2016-10-02 NOTE — Op Note (Signed)
Procedure: Right internal jugular vein Palindrome,  Left Radial Cephalic AV fistula   Preop: ESRD   Postop: ESRD   Anesthesia: MAC with local   Assistant:  Doreatha Massed, PA-c   Findings: 3 mm cephalic vein   2.5 mm radial artery   23 cm palindrome cath  Operative details: After obtaining informed consent, the patient was taken to the operating room. The patient was placed in supine position on the operating room table. After adequate sedation the patient's entire neck and chest were prepped and draped in usual sterile fashion. The patient was placed in Trendelenburg position. Ultrasound was used to identify the patient's right internal jugular vein. This had normal compressibility and respiratory variation. Local anesthesia was infiltrated over the right jugular vein.  Using ultrasound guidance, the right internal jugular vein was successfully cannulated.  A 0.035 J-tipped guidewire was threaded into the right internal jugular vein and into the superior vena cava followed by the inferior vena cava under fluoroscopic guidance.   Next sequential 12 and 14 dilators were placed over the guidewire into the right atrium.  A 16 French dilator with a peel-away sheath was then placed over the guidewire into the right atrium.   The guidewire and dilator were removed. A 23 cm Palindrome catheter was then placed through the peel away sheath into the right atrium.  The catheter was then tunneled subcutaneously, cut to length, and the hub attached. The catheter was noted to flush and draw easily. The catheter was inspected under fluoroscopy and found with its tip to be in the right atrium without any kinks throughout its course. The catheter was sutured to the skin with nylon sutures. The neck insertion site was closed with Vicryl stitch. The catheter was then loaded with concentrated Heparin solution. A dry sterile dressing was applied.    Next, the left upper extremity was prepped and draped in usual sterile  fashion. Local anesthesia was infiltrated midway between the cephalic and radial artery anatomically. The vein was not palpable due to the patients obesity. Ultrasound was used to identify the location of the vein. A longitudinal skin incision was then made in this location at the distal left forearm. The incision was carried into the subcutaneous tissues down to level cephalic vein. The vein had some spasm but was overall reasonable quality accepting a 3 mm dilator. The vein was dissected free circumferentially and small side branches ligated and divided between silk ties. The distal end was ligated and the vein probed and found to accept up to a 3 mm dilator. This was gently distended with heparinized saline, spatulated, and marked for orientation. Next the radial artery was dissected free in the medial portion incision. The artery was 2.5 mm in diameter but had a reasonable pulse. The vessel loops were placed proximal and distal to the planned site of arteriotomy. The patient was given 3000 units of intravenous heparin. After appropriate circulation time, the vessel loops were used to control the artery. A longitudinal opening was made in the left radial artery. The vein was controlled proximally with a fine bulldog clamp. The vein was then swung over to the artery and sewn end of vein to side of artery using a running 7-0 Prolene suture. Just prior to completion, the anastomosis was fore bled back bled and thoroughly flushed. The anastomosis was secured, vessel loops released, and there was a palpable thrill in the fistula immediately. A few repair sutures were placed at the toe of the anastomosis and avitene applied  to achieve hemostasis.  After hemostasis was obtained, the subcutaneous tissues were reapproximated using a running 3-0 Vicryl suture. The skin was then closed with a 4 0 Vicryl subcuticular stitch. Dermabond was applied to the skin incision.   The patient tolerated procedure well and there were  no complications. Instrument sponge and needle counts were correct at the end of the case. The patient was taken to the recovery room in stable condition. Chest x-ray will be obtained in the recovery room.  Fabienne Bruns, MD  [facility]  Office: [phone]  Pager: [phone]
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
## 2016-10-02 NOTE — Interval H&P Note (Signed)
History and Physical Interval Note:  10/02/2016 3:19 PM  Wesley Burns  has presented today for surgery, with the diagnosis of End Stage Renal Disease N18.6  The various methods of treatment have been discussed with the patient and family. After consideration of risks, benefits and other options for treatment, the patient has consented to  Procedure(s): ARTERIOVENOUS (AV) FISTULA CREATION (Left) INSERTION OF DIALYSIS CATHETER (N/A) as a surgical intervention .  The patient's history has been reviewed, patient examined, no change in status, stable for surgery.  I have reviewed the patient's chart and labs.  Questions were answered to the patient's satisfaction.     Fabienne Bruns

## 2016-10-02 NOTE — Anesthesia Preprocedure Evaluation (Addendum)
Anesthesia Evaluation  Patient identified by MRN, date of birth, ID band Patient awake    Reviewed: Allergy & Precautions, H&P , NPO status , Patient's Chart, lab work & pertinent test results  Airway Mallampati: II  TM Distance: >3 FB Neck ROM: Full    Dental no notable dental hx. (+) Teeth Intact, Dental Advisory Given   Pulmonary neg pulmonary ROS, former smoker,    Pulmonary exam normal breath sounds clear to auscultation       Cardiovascular hypertension, Pt. on medications  Rhythm:Regular Rate:Normal     Neuro/Psych Anxiety CVA, No Residual Symptoms    GI/Hepatic Neg liver ROS, GERD  Medicated and Controlled,  Endo/Other  diabetes  Renal/GU Renal InsufficiencyRenal disease  negative genitourinary   Musculoskeletal  (+) Arthritis , Osteoarthritis,    Abdominal   Peds  Hematology negative hematology ROS (+) anemia ,   Anesthesia Other Findings   Reproductive/Obstetrics negative OB ROS                            Anesthesia Physical Anesthesia Plan  ASA: III  Anesthesia Plan: MAC   Post-op Pain Management:    Induction: Intravenous  Airway Management Planned: Simple Face Mask  Additional Equipment:   Intra-op Plan:   Post-operative Plan:   Informed Consent: I have reviewed the patients History and Physical, chart, labs and discussed the procedure including the risks, benefits and alternatives for the proposed anesthesia with the patient or authorized representative who has indicated his/her understanding and acceptance.   Dental advisory given  Plan Discussed with: CRNA  Anesthesia Plan Comments:         Anesthesia Quick Evaluation

## 2016-10-03 ENCOUNTER — Encounter (HOSPITAL_COMMUNITY): Payer: Self-pay | Admitting: Vascular Surgery

## 2016-10-03 ENCOUNTER — Telehealth: Payer: Self-pay | Admitting: Vascular Surgery

## 2016-10-03 NOTE — Telephone Encounter (Signed)
No VM set for pt, mailed letter for appt on 11/30

## 2016-10-03 NOTE — Telephone Encounter (Signed)
-----   Message from Sharee Pimple, RN sent at 10/02/2016  4:40 PM EDT ----- Regarding: schedule    ----- Message ----- From: Dara Lords, PA-C Sent: 10/02/2016   4:30 PM To: [facility] Charge Pool  S/p left arm fistula 10/02/16.  F/u with Dr. Darrick Penna in 4-6 weeks.  Does not need duplex.  Thanks, Lelon Mast

## 2016-10-04 ENCOUNTER — Telehealth: Payer: Self-pay

## 2016-10-04 NOTE — Anesthesia Postprocedure Evaluation (Addendum)
Anesthesia Post Note  Patient: Wesley Burns  Procedure(s) Performed: Procedure(s) (LRB): Left Arm ARTERIOVENOUS (AV) FISTULA CREATION (Left) INSERTION OF DIALYSIS CATHETER (N/A)  Patient location during evaluation: PACU Anesthesia Type: MAC Level of consciousness: awake Pain management: pain level controlled Respiratory status: spontaneous breathing Cardiovascular status: stable Anesthetic complications: yes Anesthetic complication details: injury of cornea   Last Vitals:  Vitals:   10/02/16 2039 10/02/16 2041  BP: (!) 151/94   Pulse:  75  Resp:    Temp:      Last Pain:  Vitals:   10/02/16 1239  TempSrc: Oral  PainSc:                  Manley Fason

## 2016-10-04 NOTE — Telephone Encounter (Signed)
Rec'd phone call from pt's friend.  Reported a blood smear on pt's T-shirt, and was told to call office if any bleeding.  Reported the smear of blood was from the right dialysis catheter site.  Denied any signs of swelling/ inflammation at the right neck catheter site; denied any further bleeding.  Reported the left arm incisional area is "fine."  Reported the pt. changed his T-shirt,and has not noted any further signs of bleeding/ drainage.  Advised to continue to monitor, and to keep incisional areas clean and dry.  Verb. Understanding.

## 2016-10-07 ENCOUNTER — Encounter: Payer: 59 | Admitting: Surgery

## 2016-10-07 ENCOUNTER — Encounter (HOSPITAL_COMMUNITY): Payer: 59

## 2016-10-07 ENCOUNTER — Other Ambulatory Visit (HOSPITAL_COMMUNITY): Payer: 59

## 2016-10-09 ENCOUNTER — Ambulatory Visit: Payer: 59 | Attending: Physical Medicine & Rehabilitation | Admitting: Physical Therapy

## 2016-10-09 DIAGNOSIS — R2681 Unsteadiness on feet: Secondary | ICD-10-CM | POA: Diagnosis present

## 2016-10-09 DIAGNOSIS — M6281 Muscle weakness (generalized): Secondary | ICD-10-CM

## 2016-10-09 DIAGNOSIS — R2689 Other abnormalities of gait and mobility: Secondary | ICD-10-CM | POA: Diagnosis present

## 2016-10-09 DIAGNOSIS — I69154 Hemiplegia and hemiparesis following nontraumatic intracerebral hemorrhage affecting left non-dominant side: Secondary | ICD-10-CM

## 2016-10-09 NOTE — Patient Instructions (Signed)
  For all of the standing exercises, hold onto the counter for support.  Do exercises to your tolerance (if knee is bothering you, avoid that particular exercise).  Also if feeling weak from dialysis, perform only seated exercises.     (Home) Knee: Extension - Sitting    Place red band around both ankles.  Lift leg, straighten knee. Repeat 10 times per side once a day.  Copyright  VHI. All rights reserved.    FLEXION: Sitting - Resistance Band (Active)    Sit, both feet flat. Against red resistance band, lift right knee toward ceiling.  Repeat with left leg. Complete 10 times on each side.   Perform 1 sessions per day.  http://gtsc.exer.us/20   Copyright  VHI. All rights reserved.  ABDUCTION: Sitting - Resistance Band (Active)    Sit with feet flat. Lift right leg slightly and, against red resistance band, draw it out to side. Complete 10 repetitions. Perform 1 sessions per day.  Copyright  VHI. All rights reserved.   "I love a Parade" Lift    Using a chair if necessary, march in place as high as you can.  Repeat 10 times on each leg once a day.  http://[email or web address].exer.us/344   Copyright  VHI. All rights reserved.   HIP: Abduction - Standing (Weight)    Place weight around leg. Squeeze glutes. Raise leg out and slightly back. Do 10 times on each side once a day.  Copyright  VHI. All rights reserved.    EXTENSION: Standing (Active)    Stand, both feet flat. Draw right leg behind body as far as possible. Complete 10 times and repeat on other side.  Do once a day.  http://gtsc.exer.us/76   Copyright  VHI. All rights reserved.  AMBULATION: Side Step    Step sideways. Repeat in opposite direction. Repeat 4 times each direction.  Do once a day.  Copyright  VHI. All rights reserved.   AMBULATION: Walk Backward    Walk forward along counter top holding with 1 arm.  When at the end of the counter walk backward. Take large steps, do not drag feet. Do  4 times down and back once a day.  Copyright  VHI. All rights reserved.    SINGLE LIMB STANCE    Stance: single leg on floor. Raise leg. Hold 10 seconds. Repeat with other leg. Do 3 times on each leg once a day.  Copyright  VHI. All rights reserved.   Functional Quadriceps: Sit to Stand    Sit on edge of chair, feet flat on floor. Stand upright, extending knees fully. Repeat 10 times once a day.  http://orth.exer.us/734   Copyright  VHI. All rights reserved.
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
## 2016-10-10 ENCOUNTER — Ambulatory Visit: Payer: 59 | Admitting: Physical Medicine & Rehabilitation

## 2016-10-10 ENCOUNTER — Encounter: Payer: 59 | Admitting: Physical Medicine & Rehabilitation

## 2016-10-16 ENCOUNTER — Ambulatory Visit: Payer: 59 | Admitting: Physical Therapy

## 2016-10-16 DIAGNOSIS — M6281 Muscle weakness (generalized): Secondary | ICD-10-CM

## 2016-10-16 DIAGNOSIS — R2681 Unsteadiness on feet: Secondary | ICD-10-CM

## 2016-10-16 DIAGNOSIS — I69154 Hemiplegia and hemiparesis following nontraumatic intracerebral hemorrhage affecting left non-dominant side: Secondary | ICD-10-CM | POA: Diagnosis not present

## 2016-10-16 DIAGNOSIS — R2689 Other abnormalities of gait and mobility: Secondary | ICD-10-CM
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
## 2016-10-23 ENCOUNTER — Ambulatory Visit: Payer: 59 | Admitting: *Deleted

## 2016-10-23 ENCOUNTER — Encounter: Payer: Self-pay | Admitting: Vascular Surgery

## 2016-10-30 ENCOUNTER — Ambulatory Visit: Payer: 59 | Admitting: *Deleted

## 2016-10-31 ENCOUNTER — Encounter: Payer: Self-pay | Admitting: Vascular Surgery

## 2016-10-31 ENCOUNTER — Ambulatory Visit (INDEPENDENT_AMBULATORY_CARE_PROVIDER_SITE_OTHER): Payer: Self-pay | Admitting: Vascular Surgery

## 2016-10-31 VITALS — BP 154/85 | HR 103 | Temp 99.3°F | Resp 16 | Ht 71.0 in | Wt 147.5 lb

## 2016-10-31 DIAGNOSIS — N186 End stage renal disease: Secondary | ICD-10-CM

## 2016-10-31 DIAGNOSIS — Z992 Dependence on renal dialysis: Secondary | ICD-10-CM

## 2016-10-31 NOTE — Progress Notes (Signed)
  POST OPERATIVE OFFICE NOTE    CC:  F/u for surgery  HPI:  This is a 50 y.o. male who is s/p Right internal jugular vein Palindrome,  Left Radial Cephalic AV fistula.  He is doing well.  He reports no symptoms of steal.  Allergies  Allergen Reactions  . Prednisone Hives, Nausea And Vomiting and Swelling    Joint swelling     Current Outpatient Prescriptions  Medication Sig Dispense Refill  . amLODipine (NORVASC) 10 MG tablet Take 1 tablet (10 mg total) by mouth daily. 30 tablet 0  . bethanechol (URECHOLINE) 10 MG tablet Take 1 tablet (10 mg total) by mouth 3 (three) times daily. 90 tablet 0  . ciprofloxacin (CIPRO) 500 MG tablet Take 500 mg by mouth daily.    . clonazePAM (KLONOPIN) 0.5 MG tablet Take 0.5 mg by mouth at bedtime.    . cloNIDine (CATAPRES) 0.1 MG tablet Take 1 tablet (0.1 mg total) by mouth 2 (two) times daily. 60 tablet 0  . ferrous sulfate 325 (65 FE) MG tablet Take 1 tablet (325 mg total) by mouth 3 (three) times daily with meals. 90 tablet 0  . FLUoxetine (PROZAC) 10 MG capsule Take 1 capsule (10 mg total) by mouth daily. 30 capsule 0  . folic acid (FOLVITE) 400 MCG tablet Take 400 mcg by mouth daily.    . furosemide (LASIX) 80 MG tablet Take 2 tablets (160 mg total) by mouth 3 (three) times daily. 180 tablet 0  . magnesium oxide (MAG-OX) 400 (241.3 Mg) MG tablet Take 1 tablet (400 mg total) by mouth daily. 30 tablet 0  . oxyCODONE-acetaminophen (PERCOCET) 5-325 MG tablet Take 1 tablet by mouth every 6 (six) hours as needed for severe pain. 8 tablet 0  . pantoprazole (PROTONIX) 40 MG tablet Take 1 tablet (40 mg total) by mouth daily. 30 tablet 0  . sevelamer carbonate (RENVELA) 800 MG tablet Take 1 tablet (800 mg total) by mouth 3 (three) times daily with meals. 90 tablet 0  . sodium bicarbonate 650 MG tablet Take 1 tablet (650 mg total) by mouth 3 (three) times daily. 90 tablet 0  . tamsulosin (FLOMAX) 0.4 MG CAPS capsule Take 1 capsule (0.4 mg total) by mouth  daily after supper. 30 capsule 0  . thiamine (VITAMIN B-1) 50 MG tablet Take 100 mg by mouth daily.     No current facility-administered medications for this visit.      ROS:  See HPI  Physical Exam:  Vitals:   10/31/16 1515 10/31/16 1518  BP: (!) 157/86 (!) 154/85  Pulse: (!) 103   Resp: 16   Temp: 99.3 F (37.4 C)     Incision:  Well healed Extremities:  Palpable thrill and the full length of the fistula is visable Neuro: sensation intact  Assessment/Plan:  This is a 50 y.o. male who is s/p: Right internal jugular vein Palindrome,  Left Radial Cephalic AV fistula   He will f/u PRN.  The fistula can be used at 3 month postop.    Thomasena Edis EMMA [facility] PA-C [facility] [phone]  Clinic MD:  Pt seen and examined with Dr. Darrick Penna  Agree with above. Patent maturing AV fistula.  No steal f/u PRN  Fabienne Bruns, MD [facility] [place] Office: [phone] Pager: [phone]

## 2016-11-02 ENCOUNTER — Encounter (HOSPITAL_COMMUNITY): Payer: Self-pay | Admitting: *Deleted

## 2016-11-02 ENCOUNTER — Emergency Department (HOSPITAL_COMMUNITY): Payer: 59

## 2016-11-02 ENCOUNTER — Observation Stay (HOSPITAL_COMMUNITY)
Admission: EM | Admit: 2016-11-02 | Discharge: 2016-11-04 | Disposition: A | Discharge status: Home / Self Care | Payer: 59 | Attending: Family Medicine | Admitting: Family Medicine

## 2016-11-02 DIAGNOSIS — T829XXA Unspecified complication of cardiac and vascular prosthetic device, implant and graft, initial encounter: Secondary | ICD-10-CM | POA: Diagnosis present

## 2016-11-02 DIAGNOSIS — Z87891 Personal history of nicotine dependence: Secondary | ICD-10-CM | POA: Diagnosis not present

## 2016-11-02 DIAGNOSIS — T8249XA Other complication of vascular dialysis catheter, initial encounter: Principal | ICD-10-CM | POA: Insufficient documentation

## 2016-11-02 DIAGNOSIS — E1140 Type 2 diabetes mellitus with diabetic neuropathy, unspecified: Secondary | ICD-10-CM | POA: Diagnosis not present

## 2016-11-02 DIAGNOSIS — Z8673 Personal history of transient ischemic attack (TIA), and cerebral infarction without residual deficits: Secondary | ICD-10-CM | POA: Diagnosis not present

## 2016-11-02 DIAGNOSIS — Z96643 Presence of artificial hip joint, bilateral: Secondary | ICD-10-CM | POA: Insufficient documentation

## 2016-11-02 DIAGNOSIS — Z992 Dependence on renal dialysis: Secondary | ICD-10-CM | POA: Diagnosis not present

## 2016-11-02 DIAGNOSIS — R69 Illness, unspecified: Secondary | ICD-10-CM

## 2016-11-02 DIAGNOSIS — E872 Acidosis, unspecified: Secondary | ICD-10-CM | POA: Diagnosis present

## 2016-11-02 DIAGNOSIS — Z79899 Other long term (current) drug therapy: Secondary | ICD-10-CM | POA: Diagnosis not present

## 2016-11-02 DIAGNOSIS — N186 End stage renal disease: Secondary | ICD-10-CM

## 2016-11-02 DIAGNOSIS — I10 Essential (primary) hypertension: Secondary | ICD-10-CM | POA: Diagnosis present

## 2016-11-02 DIAGNOSIS — E1122 Type 2 diabetes mellitus with diabetic chronic kidney disease: Secondary | ICD-10-CM | POA: Insufficient documentation

## 2016-11-02 DIAGNOSIS — E1142 Type 2 diabetes mellitus with diabetic polyneuropathy: Secondary | ICD-10-CM | POA: Diagnosis present

## 2016-11-02 DIAGNOSIS — Y828 Other medical devices associated with adverse incidents: Secondary | ICD-10-CM | POA: Insufficient documentation

## 2016-11-02 DIAGNOSIS — I120 Hypertensive chronic kidney disease with stage 5 chronic kidney disease or end stage renal disease: Secondary | ICD-10-CM | POA: Diagnosis not present

## 2016-11-02 LAB — COMPREHENSIVE METABOLIC PANEL
ALK PHOS: 415 U/L — AB (ref 38–126)
ALT: 38 U/L (ref 17–63)
AST: 31 U/L (ref 15–41)
Albumin: 3.3 g/dL — ABNORMAL LOW (ref 3.5–5.0)
Anion gap: 14 (ref 5–15)
BILIRUBIN TOTAL: 0.5 mg/dL (ref 0.3–1.2)
BUN: 30 mg/dL — AB (ref 6–20)
CALCIUM: 9.8 mg/dL (ref 8.9–10.3)
CHLORIDE: 99 mmol/L — AB (ref 101–111)
CO2: 23 mmol/L (ref 22–32)
CREATININE: 6.83 mg/dL — AB (ref 0.61–1.24)
GFR, EST AFRICAN AMERICAN: 10 mL/min — AB (ref >60)
GFR, EST NON AFRICAN AMERICAN: 8 mL/min — AB (ref >60)
Glucose, Bld: 124 mg/dL — ABNORMAL HIGH (ref 65–99)
Potassium: 3.8 mmol/L (ref 3.5–5.1)
Sodium: 136 mmol/L (ref 135–145)
Total Protein: 7.3 g/dL (ref 6.5–8.1)

## 2016-11-02 LAB — MRSA PCR SCREENING: MRSA by PCR: NEGATIVE

## 2016-11-02 LAB — LACTIC ACID, PLASMA
Lactic Acid, Venous: 1 mmol/L (ref 0.5–1.9)
Lactic Acid, Venous: 1.2 mmol/L (ref 0.5–1.9)

## 2016-11-02 LAB — CBC WITH DIFFERENTIAL/PLATELET
BASOS PCT: 0 %
Basophils Absolute: 0 10*3/uL (ref 0.0–0.1)
EOS ABS: 0 10*3/uL (ref 0.0–0.7)
Eosinophils Relative: 0 %
HEMATOCRIT: 25.8 % — AB (ref 39.0–52.0)
HEMOGLOBIN: 8.4 g/dL — AB (ref 13.0–17.0)
LYMPHS ABS: 1.3 10*3/uL (ref 0.7–4.0)
LYMPHS PCT: 13 %
MCH: 28.5 pg (ref 26.0–34.0)
MCHC: 32.6 g/dL (ref 30.0–36.0)
MCV: 87.5 fL (ref 78.0–100.0)
Monocytes Absolute: 0.7 10*3/uL (ref 0.1–1.0)
Monocytes Relative: 7 %
NEUTROS ABS: 7.7 10*3/uL (ref 1.7–7.7)
Neutrophils Relative %: 80 %
Platelets: 247 10*3/uL (ref 150–400)
RBC: 2.95 MIL/uL — ABNORMAL LOW (ref 4.22–5.81)
RDW: 18.4 % — AB (ref 11.5–15.5)
WBC: 9.7 10*3/uL (ref 4.0–10.5)

## 2016-11-02 LAB — I-STAT CG4 LACTIC ACID, ED: LACTIC ACID, VENOUS: 2.31 mmol/L — AB (ref 0.5–1.9)

## 2016-11-02 LAB — PROTIME-INR
INR: 0.94
PROTHROMBIN TIME: 12.5 s (ref 11.4–15.2)

## 2016-11-02 LAB — GLUCOSE, CAPILLARY: GLUCOSE-CAPILLARY: 91 mg/dL (ref 65–99)

## 2016-11-02 MED ORDER — FOLIC ACID 0.5 MG HALF TAB
500.0000 ug | ORAL_TABLET | Freq: Every day | ORAL | Status: DC
  Administered 2016-11-02 – 2016-11-04 (×3): 0.5 mg via ORAL
  Filled 2016-11-02 (×3): qty 1

## 2016-11-02 MED ORDER — CLONAZEPAM 0.5 MG PO TABS
0.5000 mg | ORAL_TABLET | Freq: Every day | ORAL | Status: DC
  Administered 2016-11-02: 0.5 mg via ORAL

## 2016-11-02 MED ORDER — AMLODIPINE BESYLATE 10 MG PO TABS
10.0000 mg | ORAL_TABLET | Freq: Every day | ORAL | Status: DC
  Administered 2016-11-03 – 2016-11-04 (×2): 10 mg via ORAL
  Filled 2016-11-02 (×2): qty 1

## 2016-11-02 MED ORDER — VANCOMYCIN HCL IN DEXTROSE 1-5 GM/200ML-% IV SOLN
1000.0000 mg | Freq: Once | INTRAVENOUS | Status: AC
  Administered 2016-11-02: 1000 mg via INTRAVENOUS
  Filled 2016-11-02: qty 200

## 2016-11-02 MED ORDER — ACETAMINOPHEN 650 MG RE SUPP: 650.0000 mg | Freq: Four times a day (QID) | RECTAL | Status: DC | PRN

## 2016-11-02 MED ORDER — SODIUM BICARBONATE 650 MG PO TABS
650.0000 mg | ORAL_TABLET | Freq: Three times a day (TID) | ORAL | Status: DC
  Administered 2016-11-02: 650 mg via ORAL

## 2016-11-02 MED ORDER — CEFAZOLIN SODIUM-DEXTROSE 2-4 GM/100ML-% IV SOLN
2.0000 g | Freq: Once | INTRAVENOUS | Status: AC
  Administered 2016-11-02: 2 g via INTRAVENOUS
  Filled 2016-11-02: qty 100

## 2016-11-02 MED ORDER — VANCOMYCIN HCL IN DEXTROSE 750-5 MG/150ML-% IV SOLN: 750.0000 mg | INTRAVENOUS | Status: DC

## 2016-11-02 MED ORDER — CEFTRIAXONE SODIUM 1 G IJ SOLR
1.0000 g | Freq: Once | INTRAMUSCULAR | Status: AC
  Administered 2016-11-02: 1 g via INTRAVENOUS
  Filled 2016-11-02: qty 10

## 2016-11-02 MED ORDER — CEFAZOLIN SODIUM-DEXTROSE 2-4 GM/100ML-% IV SOLN: 2.0000 g | INTRAVENOUS | Status: DC

## 2016-11-02 MED ORDER — MAGNESIUM OXIDE 400 (241.3 MG) MG PO TABS: 400.0000 mg | ORAL_TABLET | Freq: Every day | ORAL | Status: DC

## 2016-11-02 MED ORDER — PANTOPRAZOLE SODIUM 40 MG PO TBEC
40.0000 mg | DELAYED_RELEASE_TABLET | Freq: Every day | ORAL | Status: DC
  Administered 2016-11-03 – 2016-11-04 (×2): 40 mg via ORAL
  Filled 2016-11-02 (×2): qty 1

## 2016-11-02 MED ORDER — SODIUM CHLORIDE 0.9 % IV BOLUS (SEPSIS)
250.0000 mL | Freq: Once | INTRAVENOUS | Status: AC
  Administered 2016-11-02: 250 mL via INTRAVENOUS

## 2016-11-02 MED ORDER — HEPARIN SODIUM (PORCINE) 5000 UNIT/ML IJ SOLN
5000.0000 [IU] | Freq: Three times a day (TID) | INTRAMUSCULAR | Status: DC
  Administered 2016-11-02 – 2016-11-04 (×5): 5000 [IU] via SUBCUTANEOUS
  Filled 2016-11-02 (×5): qty 1

## 2016-11-02 MED ORDER — ACETAMINOPHEN 325 MG PO TABS
650.0000 mg | ORAL_TABLET | Freq: Four times a day (QID) | ORAL | Status: DC | PRN
  Administered 2016-11-03 – 2016-11-04 (×2): 650 mg via ORAL
  Filled 2016-11-02 (×2): qty 2

## 2016-11-02 MED ORDER — ONDANSETRON HCL 4 MG/2ML IJ SOLN: 4.0000 mg | Freq: Four times a day (QID) | INTRAMUSCULAR | Status: DC | PRN

## 2016-11-02 MED ORDER — FUROSEMIDE 80 MG PO TABS
160.0000 mg | ORAL_TABLET | Freq: Three times a day (TID) | ORAL | Status: DC
  Administered 2016-11-02: 160 mg via ORAL

## 2016-11-02 MED ORDER — CLONAZEPAM 1 MG PO TABS
1.0000 mg | ORAL_TABLET | Freq: Every day | ORAL | Status: DC
  Administered 2016-11-03: 1 mg via ORAL
  Filled 2016-11-02: qty 1

## 2016-11-02 MED ORDER — CLONAZEPAM 0.5 MG PO TABS: 0.5000 mg | ORAL_TABLET | Freq: Every day | ORAL | Status: DC | PRN

## 2016-11-02 MED ORDER — ONDANSETRON 4 MG PO TBDP
ORAL_TABLET | ORAL | Status: AC
  Filled 2016-11-02: qty 1

## 2016-11-02 MED ORDER — BETHANECHOL CHLORIDE 10 MG PO TABS
10.0000 mg | ORAL_TABLET | Freq: Three times a day (TID) | ORAL | Status: DC
  Administered 2016-11-02 – 2016-11-04 (×6): 10 mg via ORAL
  Filled 2016-11-02 (×7): qty 1

## 2016-11-02 MED ORDER — FERROUS SULFATE 325 (65 FE) MG PO TABS: 325.0000 mg | ORAL_TABLET | Freq: Three times a day (TID) | ORAL | Status: DC

## 2016-11-02 MED ORDER — CLONIDINE HCL 0.1 MG PO TABS
0.1000 mg | ORAL_TABLET | Freq: Two times a day (BID) | ORAL | Status: DC
  Administered 2016-11-02: 0.1 mg via ORAL

## 2016-11-02 MED ORDER — FLUOXETINE HCL 10 MG PO CAPS
10.0000 mg | ORAL_CAPSULE | Freq: Every day | ORAL | Status: DC
  Administered 2016-11-03 – 2016-11-04 (×2): 10 mg via ORAL
  Filled 2016-11-02 (×3): qty 1

## 2016-11-02 MED ORDER — TAMSULOSIN HCL 0.4 MG PO CAPS
0.4000 mg | ORAL_CAPSULE | Freq: Every day | ORAL | Status: DC
  Administered 2016-11-02 – 2016-11-03 (×2): 0.4 mg via ORAL
  Filled 2016-11-02 (×2): qty 1

## 2016-11-02 MED ORDER — OXYCODONE-ACETAMINOPHEN 5-325 MG PO TABS: 1.0000 | ORAL_TABLET | Freq: Four times a day (QID) | ORAL | Status: DC | PRN

## 2016-11-02 MED ORDER — SEVELAMER CARBONATE 800 MG PO TABS
800.0000 mg | ORAL_TABLET | Freq: Three times a day (TID) | ORAL | Status: DC
  Administered 2016-11-03 – 2016-11-04 (×4): 800 mg via ORAL
  Filled 2016-11-02 (×3): qty 1

## 2016-11-02 MED ORDER — VITAMIN B-1 100 MG PO TABS
100.0000 mg | ORAL_TABLET | Freq: Every day | ORAL | Status: DC
  Administered 2016-11-03 – 2016-11-04 (×2): 100 mg via ORAL
  Filled 2016-11-02 (×2): qty 1

## 2016-11-02 MED ORDER — ONDANSETRON HCL 4 MG PO TABS: 4.0000 mg | ORAL_TABLET | Freq: Four times a day (QID) | ORAL | Status: DC | PRN

## 2016-11-02 MED ORDER — ONDANSETRON 4 MG PO TBDP
4.0000 mg | ORAL_TABLET | Freq: Once | ORAL | Status: AC | PRN
  Administered 2016-11-02: 4 mg via ORAL

## 2016-11-02 NOTE — ED Provider Notes (Signed)
Spoke with Dr Arlean Hopping who gives verbal okay to draw blood from dialysis catheter   Doug Sou, MD 11/02/16 [phone]

## 2016-11-02 NOTE — ED Notes (Signed)
Report given to 6E RN 

## 2016-11-02 NOTE — ED Notes (Signed)
Admitting MD at bedside.

## 2016-11-02 NOTE — ED Notes (Signed)
Wesley Burns [phone]

## 2016-11-02 NOTE — Progress Notes (Signed)
Pharmacy Antibiotic Note  Wesley Burns is a 50 y.o. male admitted on 11/02/2016 with possible cather infection.  Pharmacy has been consulted for vancomycin and cefazolin dosing. Pt is afebrile and WBC is WNL. Lactic acid is 2.31. Pt already received first dose of vancomycin in the ED.  Plan: - Vancomycin 750mg  IV post-HD  - Cefazolin 2gm IV x 1 now then 2gm post-HD  - F/u renal plans, C&S, clinical status and pre-HD vanc level when appropriate  Weight: 145 lb 8.1 oz (66 kg)  Temp (24hrs), Avg:97.8 F (36.6 C), Min:96.9 F (36.1 C), Max:98.3 F (36.8 C)   Recent Labs Lab 11/02/16 1533 11/02/16 1549  WBC 9.7  --   CREATININE 6.83*  --   LATICACIDVEN  --  2.31*    Estimated Creatinine Clearance: 11.8 mL/min (by C-G formula based on SCr of 6.83 mg/dL (H)).    Allergies  Allergen Reactions  . Prednisone Hives, Nausea And Vomiting and Swelling    Joint swelling     Antimicrobials this admission: Vanc 12/2>> Cefazolin 12/2>> CTX x 1 12/2  Dose adjustments this admission: N/A  Microbiology results: Pending  Thank you for allowing pharmacy to be a part of this patient's care.  Riyad Keena, 14/2 11/02/2016 7:25 PM
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
## 2016-11-02 NOTE — ED Notes (Signed)
Dr. Donnald Garre notified of lactic acid result of 2.31.

## 2016-11-02 NOTE — ED Notes (Signed)
Per IV team they cannot obtain blood draw from HD cath without order from nephrologist. EDP and Admitting MD made aware.

## 2016-11-02 NOTE — ED Triage Notes (Signed)
Pt reports going to dialysis today and sent here due to drainage from fistula left arm. Only other complaint is chills.

## 2016-11-02 NOTE — ED Notes (Signed)
States that dialysis sent him to ED after observing drainage at his dialysis site on his right chest. Reports chills since last night. N/V onset during triage in ED.

## 2016-11-02 NOTE — ED Triage Notes (Signed)
Onset of n/v while at triage, given zofran odt

## 2016-11-03 DIAGNOSIS — N186 End stage renal disease: Secondary | ICD-10-CM | POA: Diagnosis not present

## 2016-11-03 DIAGNOSIS — E872 Acidosis: Secondary | ICD-10-CM | POA: Diagnosis not present

## 2016-11-03 DIAGNOSIS — E1142 Type 2 diabetes mellitus with diabetic polyneuropathy: Secondary | ICD-10-CM | POA: Diagnosis not present

## 2016-11-03 DIAGNOSIS — I10 Essential (primary) hypertension: Secondary | ICD-10-CM | POA: Diagnosis not present

## 2016-11-03 LAB — BASIC METABOLIC PANEL
ANION GAP: 14 (ref 5–15)
BUN: 29 mg/dL — ABNORMAL HIGH (ref 6–20)
CHLORIDE: 99 mmol/L — AB (ref 101–111)
CO2: 24 mmol/L (ref 22–32)
Calcium: 9.3 mg/dL (ref 8.9–10.3)
Creatinine, Ser: 7.43 mg/dL — ABNORMAL HIGH (ref 0.61–1.24)
GFR, EST AFRICAN AMERICAN: 9 mL/min — AB (ref >60)
GFR, EST NON AFRICAN AMERICAN: 7 mL/min — AB (ref >60)
Glucose, Bld: 82 mg/dL (ref 65–99)
POTASSIUM: 3.5 mmol/L (ref 3.5–5.1)
SODIUM: 137 mmol/L (ref 135–145)

## 2016-11-03 LAB — INFLUENZA PANEL BY PCR (TYPE A & B)
INFLAPCR: NEGATIVE
Influenza B By PCR: NEGATIVE

## 2016-11-03 LAB — URINE MICROSCOPIC-ADD ON: Bacteria, UA: NONE SEEN

## 2016-11-03 LAB — URINALYSIS, ROUTINE W REFLEX MICROSCOPIC
Bilirubin Urine: NEGATIVE
Glucose, UA: 100 mg/dL — AB
Ketones, ur: NEGATIVE mg/dL
Leukocytes, UA: NEGATIVE
NITRITE: NEGATIVE
PH: 8 (ref 5.0–8.0)
Protein, ur: >300 mg/dL — AB
SPECIFIC GRAVITY, URINE: 1.016 (ref 1.005–1.030)

## 2016-11-03 LAB — CBC
HCT: 25.2 % — ABNORMAL LOW (ref 39.0–52.0)
HEMOGLOBIN: 8.1 g/dL — AB (ref 13.0–17.0)
MCH: 28.2 pg (ref 26.0–34.0)
MCHC: 32.1 g/dL (ref 30.0–36.0)
MCV: 87.8 fL (ref 78.0–100.0)
PLATELETS: 246 10*3/uL (ref 150–400)
RBC: 2.87 MIL/uL — AB (ref 4.22–5.81)
RDW: 18.2 % — ABNORMAL HIGH (ref 11.5–15.5)
WBC: 7.8 10*3/uL (ref 4.0–10.5)

## 2016-11-03 MED ORDER — NEPRO/CARBSTEADY PO LIQD
237.0000 mL | Freq: Two times a day (BID) | ORAL | Status: DC
  Administered 2016-11-03 – 2016-11-04 (×3): 237 mL via ORAL
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
## 2016-11-03 NOTE — Progress Notes (Signed)
PROGRESS NOTE [facility]   Wesley Burns   OFB:[id number] DOB: 21-Nov-1966  DOA: 11/02/2016 PCP: Darrow Bussing, MD   Brief Narrative:  50 year old male with history of end-stage renal disease on dialysis Tuesday, Thursday and Saturdays. Type 2 diabetes mellitus well controlled, hypertension and history of stroke. Patient presents to the dialysis center where he had some drainage from his port site. Nursing staff recommend him to to the ED. In the emergency room he was complaining of chills but no fevers and also to be cold and clammy for the past 3 days. The report that he was feeling slightly weak on Tuesday prompting to this his dialysis section. Lab workup in the ED showed lactic acid of 2.3 with white count of 9.7. Patient was admitted for treatment of possible HD catheter associated infection. Blood cultures were done and IV antibiotics were given. Patient has not been dialyzed since Thursday. Nephrology was consulted.  Subjective: Patient seen and examined at bedside. He has no complaints and reports feeling well.   Assessment & Plan:  Lactic acidosis - resolved with IV fluids, clear etiology thought of HD catheter associated infection although less likely, blood cx so far no growth, no increase in WBC, and hemodynamically stable. Patient have flu like symptoms, viral syndrome could explain slight increase in lactic acid, in view of ESRD.  Patient was given IVF and abx - vanco and cefazolin  Follow up blood cx - if continues to be negative can d/c home tomorrow with few extra doses of vanc with HD   ESRD - need to be dialyze, given missing yesterday, BP slight elevated and some rales at the bases, mild overload. Nephrology consulted  HD per renal  Monitor for signs of fluid overload   Anemia of chronic disease 2/2 CKD  Hb at baseline  Epo and Fe per nephrology recommendations  HTN - slight elevate likely due to overload  Hopefully improved with HD  Continue home meds    Type II DM - stable last A1C 4.5 06/2016 SSI  CBGs   DVT prophylaxis: Heparin  Code Status: Full/ Family Communication: None at bedside  Disposition Plan: Anticipate discharge tomorrow after dialysis    Consultants:   Nephrology   Procedures:   Hemodialysis   Antimicrobials:  Vanc 12/2  Ancef 12/2   Objective: Vitals:   11/02/16 1818 11/02/16 1830 11/03/16 0600 11/03/16 0913  BP:  167/87 (!) 152/94 (!) 152/72  Pulse:  93 86 89  Resp:  19 17 18   Temp: 98.3 F (36.8 C)  98.7 F (37.1 C) 98.7 F (37.1 C)  TempSrc: Oral  Oral Oral  SpO2:  97% 98% 100%  Weight:        Intake/Output Summary (Last 24 hours) at 11/03/16 1607 Last data filed at 11/03/16 1501  Gross per 24 hour  Intake             1470 ml  Output                0 ml  Net             1470 ml   Filed Weights   11/02/16 1352  Weight: 66 kg (145 lb 8.1 oz)    Examination:  General exam: Appears calm and comfortable  Respiratory system: Good air entry, mild bibasilar crackles  Cardiovascular system: S1 & S2 heard, RRR. No JVD, murmurs, rubs or gallops Gastrointestinal system: Abdomen is nondistended, soft and nontender.  Normal bowel sounds heard. Central nervous system:  Alert and oriented. Left hand deficit  Extremities: No pedal edema.  Skin: No rashes, lesions or ulcers, Left lower arm fistula good thrill, port with no signs of infection  .    Data Reviewed: I have personally reviewed following labs and imaging studies  CBC:  Recent Labs Lab 11/02/16 1533 11/03/16 0539  WBC 9.7 7.8  NEUTROABS 7.7  --   HGB 8.4* 8.1*  HCT 25.8* 25.2*  MCV 87.5 87.8  PLT 247 246   Basic Metabolic Panel:  Recent Labs Lab 11/02/16 1533 11/03/16 0539  NA 136 137  K 3.8 3.5  CL 99* 99*  CO2 23 24  GLUCOSE 124* 82  BUN 30* 29*  CREATININE 6.83* 7.43*  CALCIUM 9.8 9.3   GFR: Estimated Creatinine Clearance: 10.9 mL/min (by C-G formula based on SCr of 7.43 mg/dL (H)). Liver Function  Tests:  Recent Labs Lab 11/02/16 1533  AST 31  ALT 38  ALKPHOS 415*  BILITOT 0.5  PROT 7.3  ALBUMIN 3.3*   No results for input(s): LIPASE, AMYLASE in the last 168 hours. No results for input(s): AMMONIA in the last 168 hours. Coagulation Profile:  Recent Labs Lab 11/02/16 1533  INR 0.94   Cardiac Enzymes: No results for input(s): CKTOTAL, CKMB, CKMBINDEX, TROPONINI in the last 168 hours. BNP (last 3 results) No results for input(s): PROBNP in the last 8760 hours. HbA1C: No results for input(s): HGBA1C in the last 72 hours. CBG:  Recent Labs Lab 11/02/16 1931  GLUCAP 91   Lipid Profile: No results for input(s): CHOL, HDL, LDLCALC, TRIG, CHOLHDL, LDLDIRECT in the last 72 hours. Thyroid Function Tests: No results for input(s): TSH, T4TOTAL, FREET4, T3FREE, THYROIDAB in the last 72 hours. Anemia Panel: No results for input(s): VITAMINB12, FOLATE, FERRITIN, TIBC, IRON, RETICCTPCT in the last 72 hours. Sepsis Labs:  Recent Labs Lab 11/02/16 1549 11/02/16 1932 11/02/16 2240  LATICACIDVEN 2.31* 1.0 1.2    Recent Results (from the past 240 hour(s))  Culture, blood (Routine x 2)     Status: None (Preliminary result)   Collection Time: 11/02/16  3:33 PM  Result Value Ref Range Status   Specimen Description BLOOD RIGHT FOREARM  Final   Special Requests BOTTLES DRAWN AEROBIC AND ANAEROBIC 5CC  Final   Culture NO GROWTH < 24 HOURS  Final   Report Status PENDING  Incomplete  Culture, blood (Routine x 2)     Status: None (Preliminary result)   Collection Time: 11/02/16  4:30 PM  Result Value Ref Range Status   Specimen Description BLOOD RIGHT ANTECUBITAL  Final   Special Requests BOTTLES DRAWN AEROBIC AND ANAEROBIC 5CC  Final   Culture NO GROWTH < 24 HOURS  Final   Report Status PENDING  Incomplete  Culture, blood (single)     Status: None (Preliminary result)   Collection Time: 11/02/16  6:40 PM  Result Value Ref Range Status   Specimen Description BLOOD RIGHT  HEMODIALYSIS CATHETER  Final   Special Requests BOTTLES DRAWN AEROBIC ONLY 5CC  Final   Culture NO GROWTH < 24 HOURS  Final   Report Status PENDING  Incomplete  MRSA PCR Screening     Status: None   Collection Time: 11/02/16  7:19 PM  Result Value Ref Range Status   MRSA by PCR NEGATIVE NEGATIVE Final    Comment:        The GeneXpert MRSA Assay (FDA approved for NASAL specimens only), is one component of a comprehensive MRSA colonization surveillance program. It  is not intended to diagnose MRSA infection nor to guide or monitor treatment for MRSA infections.      Radiology Studies: Dg Chest 2 View  Result Date: 11/02/2016 CLINICAL DATA:  50 year old male dialysis patient with history of nausea, vomiting and chills. Drainage from the fistula in his left arm. EXAM: CHEST  2 VIEW COMPARISON:  Chest x-ray 10/02/2016. FINDINGS: Right internal jugular PermCath with tip terminating at the superior cavoatrial junction. Lung volumes are normal. No consolidative airspace disease. No pleural effusions. No pneumothorax. No pulmonary nodule or mass noted. Pulmonary vasculature and the cardiomediastinal silhouette are within normal limits. Orthopedic fixation hardware in the left proximal humerus with old healed left humeral neck fracture. IMPRESSION: No radiographic evidence of acute cardiopulmonary disease. Electronically Signed   By: Trudie Reedaniel  Entrikin M.D.   On: 11/02/2016 14:54    Scheduled Meds: . amLODipine  10 mg Oral Daily  . bethanechol  10 mg Oral TID  . [START ON 11/05/2016]  ceFAZolin (ANCEF) IV  2 g Intravenous Q T,Th,Sat-1800  . clonazePAM  1 mg Oral QHS  . feeding supplement (NEPRO CARB STEADY)  237 mL Oral BID BM  . FLUoxetine  10 mg Oral Daily  . folic acid  500 mcg Oral Daily  . heparin  5,000 Units Subcutaneous Q8H  . pantoprazole  40 mg Oral Q1200  . sevelamer carbonate  800 mg Oral TID WC  . tamsulosin  0.4 mg Oral QPC supper  . thiamine  100 mg Oral Daily  . [START ON  11/05/2016] vancomycin  750 mg Intravenous Q T,Th,Sa-HD   Continuous Infusions:   LOS: 0 days    Latrelle DodrillEdwin Silva, MD [facility] Pager [phone]  If 7PM-7AM, please contact night-coverage [email or web address] Password [email or web address] 11/03/2016, 4:07 PM

## 2016-11-03 NOTE — Progress Notes (Signed)
New Admission Note:  Arrival Method: Pt arrived by bed from ED around 1900. Mental Orientation: Alert and oriented x4 Telemetry: No tele Assessment: Completed Refer to flowsheets Skin: Completed with [place] Nurse, Refer to flowsheets.  Iv: R AC saline locked Pain: Denies Tubes: None Safety Measures: Safety Fall Prevention Plan was given and discussed. Pt has yellow socks and armband. Bed alarm on.  Admission: Completed [facility] Orientation: Patient has been orientated to the room, unit and the staff. Family: None at bedside  Orders have been reviewed and implemented. Will continue to monitor the patient. Call light has been placed within reach and bed alarm has been activated.   Alfonse Ras, RN  Phone Number: [phone]

## 2016-11-04 DIAGNOSIS — E872 Acidosis: Secondary | ICD-10-CM | POA: Diagnosis not present

## 2016-11-04 DIAGNOSIS — N186 End stage renal disease: Secondary | ICD-10-CM | POA: Diagnosis not present

## 2016-11-04 DIAGNOSIS — I10 Essential (primary) hypertension: Secondary | ICD-10-CM | POA: Diagnosis not present

## 2016-11-04 DIAGNOSIS — E1142 Type 2 diabetes mellitus with diabetic polyneuropathy: Secondary | ICD-10-CM | POA: Diagnosis not present

## 2016-11-04 LAB — BASIC METABOLIC PANEL
Anion gap: 13 (ref 5–15)
BUN: 37 mg/dL — AB (ref 6–20)
CHLORIDE: 100 mmol/L — AB (ref 101–111)
CO2: 22 mmol/L (ref 22–32)
CREATININE: 8.65 mg/dL — AB (ref 0.61–1.24)
Calcium: 9.3 mg/dL (ref 8.9–10.3)
GFR calc non Af Amer: 6 mL/min — ABNORMAL LOW (ref >60)
GFR, EST AFRICAN AMERICAN: 7 mL/min — AB (ref >60)
Glucose, Bld: 89 mg/dL (ref 65–99)
POTASSIUM: 3.5 mmol/L (ref 3.5–5.1)
Sodium: 135 mmol/L (ref 135–145)

## 2016-11-04 LAB — CBC
HEMATOCRIT: 24.4 % — AB (ref 39.0–52.0)
Hemoglobin: 7.9 g/dL — ABNORMAL LOW (ref 13.0–17.0)
MCH: 28.5 pg (ref 26.0–34.0)
MCHC: 32.4 g/dL (ref 30.0–36.0)
MCV: 88.1 fL (ref 78.0–100.0)
PLATELETS: 279 10*3/uL (ref 150–400)
RBC: 2.77 MIL/uL — AB (ref 4.22–5.81)
RDW: 18 % — ABNORMAL HIGH (ref 11.5–15.5)
WBC: 9.6 10*3/uL (ref 4.0–10.5)

## 2016-11-04 MED ORDER — CEFAZOLIN SODIUM-DEXTROSE 2-4 GM/100ML-% IV SOLN
2.0000 g | Freq: Once | INTRAVENOUS | Status: AC
  Administered 2016-11-04: 2 g via INTRAVENOUS
  Filled 2016-11-04: qty 100

## 2016-11-04 MED ORDER — ALTEPLASE 2 MG IJ SOLR
2.0000 mg | Freq: Once | INTRAMUSCULAR | Status: DC | PRN
Start: 2016-11-04 — End: 2016-11-04

## 2016-11-04 MED ORDER — LIDOCAINE-PRILOCAINE 2.5-2.5 % EX CREA: 1.0000 "application " | TOPICAL_CREAM | CUTANEOUS | Status: DC | PRN

## 2016-11-04 MED ORDER — PENTAFLUOROPROP-TETRAFLUOROETH EX AERO: 1.0000 "application " | INHALATION_SPRAY | CUTANEOUS | Status: DC | PRN

## 2016-11-04 MED ORDER — VANCOMYCIN HCL IN DEXTROSE 750-5 MG/150ML-% IV SOLN
750.0000 mg | Freq: Once | INTRAVENOUS | Status: AC
  Administered 2016-11-04: 750 mg via INTRAVENOUS
  Filled 2016-11-04: qty 150

## 2016-11-04 MED ORDER — LIDOCAINE HCL (PF) 1 % IJ SOLN: 5.0000 mL | INTRAMUSCULAR | Status: DC | PRN

## 2016-11-04 MED ORDER — SODIUM CHLORIDE 0.9 % IV SOLN: 100.0000 mL | INTRAVENOUS | Status: DC | PRN

## 2016-11-04 MED ORDER — HEPARIN SODIUM (PORCINE) 1000 UNIT/ML DIALYSIS: 1000.0000 [IU] | INTRAMUSCULAR | Status: DC | PRN

## 2016-11-04 MED ORDER — HEPARIN SODIUM (PORCINE) 1000 UNIT/ML DIALYSIS: 2000.0000 [IU] | Freq: Once | INTRAMUSCULAR | Status: DC

## 2016-11-04 NOTE — Progress Notes (Signed)
Patient Discharge: Disposition: Patient discharged to home. Education: Reviewed medications, follow-up appointments and discharge instructions, understood and acknowledged. IV: Discontinued IV before discharge. Telemetry: N/A Transportation: Patient escorted out of the unit accompanied by the friends. Belongings: Patient took all his belongings with him.

## 2016-11-04 NOTE — Progress Notes (Signed)
Subjective:  Seen on HD- no temp overnight- cultures neg in less than 24 hours - feels fine- seen on hd Objective Vital signs in last 24 hours: Vitals:   11/04/16 0820 11/04/16 0825 11/04/16 0900 11/04/16 0930  BP: (!) 177/90 (!) 177/73 (!) 161/83 (!) 166/81  Pulse: 82 86 83 86  Resp: 18 18 18 18   Temp:      TempSrc:      SpO2:      Weight:       Weight change:   Intake/Output Summary (Last 24 hours) at 11/04/16 1020 Last data filed at 11/04/16 0600  Gross per 24 hour  Intake              720 ml  Output              100 ml  Net              620 ml   Dialysis: TTS [facility]   4h   66kg   2/2 bath  H3p 2000  R IJ cath/ LFA AVF (10/02/16) M-255 q 2, last 11/30 Venof 100 x 10 start 11/16  pth 126, Hb 8.6     Assessment/ Plan: Pt is a 50 y.o. yo male who was admitted on 11/02/2016 with chills and possible catheter drainage   Assessment/Plan: 1. Fever/chills - supposedly- no WBC- no fever overnight- cultures negative so far- from my standpoint could be discharged home- we can follow up on cultures and treat as needed - on ancef and vanc here 2. ESRD - normally TTS at [facility]- done today as a make up for Saturday- will need treatment tomorrow to keep on schedule- either here or at [facility] 3. Anemia- low- give darbe here 4. Secondary hyperparathyroidism- cont home renvela 5. HTN/volume- is under EDW but seems like from BP needs challenge- on Norvasc and flomax  Flor Whitacre A    Labs: Basic Metabolic Panel:  Recent Labs Lab 11/02/16 1533 11/03/16 0539 11/04/16 0627  NA 136 137 135  K 3.8 3.5 3.5  CL 99* 99* 100*  CO2 23 24 22   GLUCOSE 124* 82 89  BUN 30* 29* 37*  CREATININE 6.83* 7.43* 8.65*  CALCIUM 9.8 9.3 9.3   Liver Function Tests:  Recent Labs Lab 11/02/16 1533  AST 31  ALT 38  ALKPHOS 415*  BILITOT 0.5  PROT 7.3  ALBUMIN 3.3*   No results for input(s): LIPASE, AMYLASE in the last 168 hours. No results for input(s): AMMONIA in the last 168  hours. CBC:  Recent Labs Lab 11/02/16 1533 11/03/16 0539 11/04/16 0627  WBC 9.7 7.8 9.6  NEUTROABS 7.7  --   --   HGB 8.4* 8.1* 7.9*  HCT 25.8* 25.2* 24.4*  MCV 87.5 87.8 88.1  PLT 247 246 279   Cardiac Enzymes: No results for input(s): CKTOTAL, CKMB, CKMBINDEX, TROPONINI in the last 168 hours. CBG:  Recent Labs Lab 11/02/16 1931  GLUCAP 91    Iron Studies: No results for input(s): IRON, TIBC, TRANSFERRIN, FERRITIN in the last 72 hours. Studies/Results: Dg Chest 2 View  Result Date: 11/02/2016 CLINICAL DATA:  50 year old male dialysis patient with history of nausea, vomiting and chills. Drainage from the fistula in his left arm. EXAM: CHEST  2 VIEW COMPARISON:  Chest x-ray 10/02/2016. FINDINGS: Right internal jugular PermCath with tip terminating at the superior cavoatrial junction. Lung volumes are normal. No consolidative airspace disease. No pleural effusions. No pneumothorax. No pulmonary nodule or mass noted. Pulmonary vasculature and  the cardiomediastinal silhouette are within normal limits. Orthopedic fixation hardware in the left proximal humerus with old healed left humeral neck fracture. IMPRESSION: No radiographic evidence of acute cardiopulmonary disease. Electronically Signed   By: Trudie Reed M.D.   On: 11/02/2016 14:54   Medications: Infusions:   Scheduled Medications: . amLODipine  10 mg Oral Daily  . bethanechol  10 mg Oral TID  . [START ON 11/05/2016]  ceFAZolin (ANCEF) IV  2 g Intravenous Q T,Th,Sat-1800  . clonazePAM  1 mg Oral QHS  . feeding supplement (NEPRO CARB STEADY)  237 mL Oral BID BM  . FLUoxetine  10 mg Oral Daily  . folic acid  500 mcg Oral Daily  . [START ON 11/05/2016] heparin  2,000 Units Dialysis Once in dialysis  . heparin  5,000 Units Subcutaneous Q8H  . pantoprazole  40 mg Oral Q1200  . sevelamer carbonate  800 mg Oral TID WC  . tamsulosin  0.4 mg Oral QPC supper  . thiamine  100 mg Oral Daily  . [START ON 11/05/2016] vancomycin   750 mg Intravenous Q T,Th,Sa-HD    have reviewed scheduled and prn medications.  Physical Exam: General: nad Heart: RRR Lungs: clear Abdomen: soft, non tender Extremities: no edema Dialysis Access: right sided pc- also maturing avf     11/04/2016,10:20 AM  LOS: 0 days

## 2016-11-04 NOTE — Progress Notes (Signed)
Initial Nutrition Assessment  DOCUMENTATION CODES:   Non-severe (moderate) malnutrition in context of chronic illness  INTERVENTION:  Recommend continuation of nutritional supplements at home to aid in caloric and protein needs.   NUTRITION DIAGNOSIS:   Malnutrition (Moderate) related to chronic illness as evidenced by severe depletion of body fat, moderate depletions of muscle mass.  GOAL:   Patient will meet greater than or equal to 90% of their needs  MONITOR:   PO intake, Supplement acceptance, Labs, Weight trends, Skin, I & O's  REASON FOR ASSESSMENT:   Malnutrition Screening Tool    ASSESSMENT:   50 year old male with history of end-stage renal disease on dialysis Tuesday, Thursday and Saturdays. Type 2 diabetes mellitus well controlled, hypertension and history of stroke. Patient presents to the dialysis center where he had some drainage from his port site. Nursing staff recommend him to to the ED.  Patient was admitted for treatment of possible HD catheter associated infection.  Plan for discharge today as pt has been feeling better and cultures are neg. Pt reports having a good appetite currently and PTA with usual consumption of at least 3 meals a day with an Ensure or Glucerna shake at least once daily. Pt with weight loss since admission which is likely related to fluid status. Current meal completion has been 60-100%. Pt currently has Nepro Shake ordered. RD to continue with orders. Pt encouraged to continue with his supplements at home.   Nutrition-Focused physical exam completed. Findings are severe fat depletion, moderate muscle depletion, and mild edema.   Labs and medications reviewed.   Diet Order:  Diet renal with fluid restriction Fluid restriction: 1200 mL Fluid; Room service appropriate? Yes; Fluid consistency: Thin Diet - low sodium heart healthy  Skin:  Reviewed, no issues  Last BM:  12/2  Height:   Ht Readings from Last 1 Encounters:  10/31/16  5\' 11"  (1.803 m)    Weight:   Wt Readings from Last 1 Encounters:  11/04/16 134 lb 14.7 oz (61.2 kg)    Ideal Body Weight:  73.18 kg  BMI:  Body mass index is 18.82 kg/m.  Estimated Nutritional Needs:   Kcal:  2000-2200  Protein:  90-105 grams  Fluid:  1.2 L/day  EDUCATION NEEDS:   Education needs addressed  14/04/17, MS, RD, LDN Pager # [phone] After hours/ weekend pager # [phone]

## 2016-11-04 NOTE — Care Management Note (Signed)
Case Management Note  Patient Details  Name: JAMORRIS NDIAYE MRN: [id number] Date of Birth: 08-13-1966  Subjective/Objective:     Lactic Acidosis, ESRD new to dialysis one month ago               Action/Plan: Discharge Planning: AVS reviewed: Chart reviewed. No NCM needs identified.    PCP Darrow Bussing   Expected Discharge Date:  11/04/2016            Expected Discharge Plan:  Home/Self Care  In-House Referral:  NA  Discharge planning Services  CM Consult  Post Acute Care Choice:  NA Choice offered to:  NA  DME Arranged:  N/A DME Agency:  NA  HH Arranged:  NA HH Agency:  NA  Status of Service:  Completed, signed off  If discussed at Long Length of Stay Meetings, dates discussed:    Additional Comments:  Elliot Cousin, RN 11/04/2016, 1:58 PM

## 2016-11-04 NOTE — Procedures (Signed)
Patient was seen on dialysis and the procedure was supervised.  BFR 400  Via PC BP is  171/90.   Patient appears to be tolerating treatment well  Shalissa Easterwood A 11/04/2016

## 2016-11-04 NOTE — Discharge Summary (Addendum)
Physician Discharge Summary  Wesley Burns  YOV:[id number]  DOB: 03-06-1966  DOA: 11/02/2016 PCP: Darrow Bussing, MD  Admit date: 11/02/2016 Discharge date: 11/04/2016  Admitted From: Home  Disposition:  Home   Recommendations for Outpatient Follow-up:  1. Follow up with PCP in 1-2 weeks 2. Please obtain BMP/CBC in one week 3. Please follow up on the following pending results: Blood cultures   Home Health: None  Equipment/Devices: None   Discharge Condition: Stable  CODE STATUS: Full  Diet recommendation: Heart Healthy / Carb Modified  Brief/Interim Summary: 50 year old male with history of end-stage renal disease on dialysis Tuesday, Thursday and Saturdays. Type 2 diabetes mellitus well controlled, hypertension and history of stroke. Patient presents to the dialysis center where he had some drainage from his port site. Nursing staff recommend him to to the ED. In the emergency room he was complaining of chills but no fevers and also to be cold and clammy for the past 3 days. The report that he was feeling slightly weak on Tuesday prompting to this his dialysis section. Lab workup in the ED showed lactic acid of 2.3 with white count of 9.7. Patient was admitted for treatment of possible HD catheter associated infection. Blood cultures were done and IV antibiotics were given. Patient has not been dialyzed since Thursday. Nephrology was consulted. Patient was given Vanco and Ancef. He was dialyzed. Blood culture has been negative so far. Patient is clinically well, with no fever or complaints. Patient will be discharge home with follow up with PCP. Patient will continue   Subjective: Patient seen and examined at dialysis unit. Patient doing well no complaints. Flu was negative. No acute events overnight. Remains afebrile.   Discharge Diagnoses:  Lactic acidosis - resolved with IV fluids, clear etiology thought of HD catheter associated infection although less likely, blood cx so far no  growth, afebrile, no increase in WBC, and hemodynamically stable. Patient have flu like symptoms, viral syndrome could explain slight increase in lactic acid, in view of ESRD.  Patient was given IVF and abx - vanco and cefazolin with HD  Blood cx no growth up to date, Nephrology will follow final result and treat according.  Can discontinue Ancef Can also give 3 doses of Vanc during dialysis or until blood cx are fully negative.    ESRD - new to dialysis about a month ago HD as schedule by nephrologist   Anemia of chronic disease 2/2 CKD  Hb at baseline  Epo and Fe per nephrology recommendations  HTN - Improved with HD  Continue home meds  Monitor BP  Follow up with PCP   Type II DM - stable last A1C 4.5 06/2016 Diet controlled  Follow up with PCP    Discharge Instructions  Discharge Instructions    Call MD for:  difficulty breathing, headache or visual disturbances    Complete by:  As directed    Call MD for:  extreme fatigue    Complete by:  As directed    Call MD for:  hives    Complete by:  As directed    Call MD for:  persistant dizziness or light-headedness    Complete by:  As directed    Call MD for:  persistant nausea and vomiting    Complete by:  As directed    Call MD for:  redness, tenderness, or signs of infection (pain, swelling, redness, odor or green/yellow discharge around incision site)    Complete by:  As directed  Call MD for:  severe uncontrolled pain    Complete by:  As directed    Call MD for:  temperature >100.4    Complete by:  As directed    Diet - low sodium heart healthy    Complete by:  As directed    Discharge instructions    Complete by:  As directed    Increase activity slowly    Complete by:  As directed        Medication List    TAKE these medications   amLODipine 10 MG tablet Commonly known as:  NORVASC Take 1 tablet (10 mg total) by mouth daily.   bethanechol 10 MG tablet Commonly known as:  URECHOLINE Take 1 tablet  (10 mg total) by mouth 3 (three) times daily.   clonazePAM 0.5 MG tablet Commonly known as:  KLONOPIN Take 0.5-1 mg by mouth See admin instructions. Take 1 tablet (0.5 mg) by mouth during the day and/or 2 tablets (1 mg) at bedtime - as needed for anxiety/sleep   cloNIDine 0.1 MG tablet Commonly known as:  CATAPRES Take 1 tablet (0.1 mg total) by mouth 2 (two) times daily.   febuxostat 40 MG tablet Commonly known as:  ULORIC Take 20 mg by mouth daily.   ferrous sulfate 325 (65 FE) MG tablet Take 1 tablet (325 mg total) by mouth 3 (three) times daily with meals.   FLUoxetine 10 MG capsule Commonly known as:  PROZAC Take 1 capsule (10 mg total) by mouth daily.   folic acid 400 MCG tablet Commonly known as:  FOLVITE Take 400 mcg by mouth daily.   furosemide 80 MG tablet Commonly known as:  LASIX Take 2 tablets (160 mg total) by mouth 3 (three) times daily.   magnesium oxide 400 (241.3 Mg) MG tablet Commonly known as:  MAG-OX Take 1 tablet (400 mg total) by mouth daily.   MITIGARE 0.6 MG Caps Generic drug:  Colchicine Take 0.6 mg by mouth 2 (two) times a week. Mondays and Fridays   oxyCODONE-acetaminophen 5-325 MG tablet Commonly known as:  PERCOCET Take 1 tablet by mouth every 6 (six) hours as needed for severe pain.   pantoprazole 40 MG tablet Commonly known as:  PROTONIX Take 1 tablet (40 mg total) by mouth daily.   sevelamer carbonate 800 MG tablet Commonly known as:  RENVELA Take 1 tablet (800 mg total) by mouth 3 (three) times daily with meals.   sodium bicarbonate 650 MG tablet Take 1 tablet (650 mg total) by mouth 3 (three) times daily.   tamsulosin 0.4 MG Caps capsule Commonly known as:  FLOMAX Take 1 capsule (0.4 mg total) by mouth daily after supper.   thiamine 100 MG tablet Commonly known as:  VITAMIN B-1 Take 100 mg by mouth daily.       Allergies  Allergen Reactions  . Prednisone Hives, Nausea And Vomiting and Swelling    Joint swelling      Consultations:  Nephrology - [facility]    Procedures/Studies: Dg Chest 2 View  Result Date: 11/02/2016 CLINICAL DATA:  50 year old male dialysis patient with history of nausea, vomiting and chills. Drainage from the fistula in his left arm. EXAM: CHEST  2 VIEW COMPARISON:  Chest x-ray 10/02/2016. FINDINGS: Right internal jugular PermCath with tip terminating at the superior cavoatrial junction. Lung volumes are normal. No consolidative airspace disease. No pleural effusions. No pneumothorax. No pulmonary nodule or mass noted. Pulmonary vasculature and the cardiomediastinal silhouette are within normal limits. Orthopedic fixation  hardware in the left proximal humerus with old healed left humeral neck fracture. IMPRESSION: No radiographic evidence of acute cardiopulmonary disease. Electronically Signed   By: Trudie Reed M.D.   On: 11/02/2016 14:54      Discharge Exam: Vitals:   11/04/16 1150 11/04/16 1251  BP: 135/68 138/70  Pulse: 88   Resp: 16   Temp: 98.7 F (37.1 C)    Vitals:   11/04/16 1100 11/04/16 1130 11/04/16 1150 11/04/16 1251  BP: 138/68 130/73 135/68 138/70  Pulse: 93 89 88   Resp: 16 16 16    Temp:   98.7 F (37.1 C)   TempSrc:   Oral   SpO2:   95%   Weight:   61.2 kg (134 lb 14.7 oz)     General: Pt is alert, awake, not in acute distress Cardiovascular: RRR, S1/S2 +, no rubs, no gallops Respiratory: CTA bilaterally, no wheezing, no rhonchi Abdominal: Soft, NT, ND, bowel sounds + Extremities: no edema, LLA fistula with good thrill    The results of significant diagnostics from this hospitalization (including imaging, microbiology, ancillary and laboratory) are listed below for reference.     Microbiology: Recent Results (from the past 240 hour(s))  Culture, blood (Routine x 2)     Status: None (Preliminary result)   Collection Time: 11/02/16  3:33 PM  Result Value Ref Range Status   Specimen Description BLOOD RIGHT FOREARM  Final   Special  Requests BOTTLES DRAWN AEROBIC AND ANAEROBIC 5CC  Final   Culture NO GROWTH < 24 HOURS  Final   Report Status PENDING  Incomplete  Culture, blood (Routine x 2)     Status: None (Preliminary result)   Collection Time: 11/02/16  4:30 PM  Result Value Ref Range Status   Specimen Description BLOOD RIGHT ANTECUBITAL  Final   Special Requests BOTTLES DRAWN AEROBIC AND ANAEROBIC 5CC  Final   Culture NO GROWTH < 24 HOURS  Final   Report Status PENDING  Incomplete  Culture, blood (single)     Status: None (Preliminary result)   Collection Time: 11/02/16  6:40 PM  Result Value Ref Range Status   Specimen Description BLOOD RIGHT HEMODIALYSIS CATHETER  Final   Special Requests BOTTLES DRAWN AEROBIC ONLY 5CC  Final   Culture NO GROWTH < 24 HOURS  Final   Report Status PENDING  Incomplete  MRSA PCR Screening     Status: None   Collection Time: 11/02/16  7:19 PM  Result Value Ref Range Status   MRSA by PCR NEGATIVE NEGATIVE Final    Comment:        The GeneXpert MRSA Assay (FDA approved for NASAL specimens only), is one component of a comprehensive MRSA colonization surveillance program. It is not intended to diagnose MRSA infection nor to guide or monitor treatment for MRSA infections.      Labs: BNP (last 3 results) No results for input(s): BNP in the last 8760 hours. Basic Metabolic Panel:  Recent Labs Lab 11/02/16 1533 11/03/16 0539 11/04/16 0627  NA 136 137 135  K 3.8 3.5 3.5  CL 99* 99* 100*  CO2 23 24 22   GLUCOSE 124* 82 89  BUN 30* 29* 37*  CREATININE 6.83* 7.43* 8.65*  CALCIUM 9.8 9.3 9.3   Liver Function Tests:  Recent Labs Lab 11/02/16 1533  AST 31  ALT 38  ALKPHOS 415*  BILITOT 0.5  PROT 7.3  ALBUMIN 3.3*   No results for input(s): LIPASE, AMYLASE in the last 168 hours. No  results for input(s): AMMONIA in the last 168 hours. CBC:  Recent Labs Lab 11/02/16 1533 11/03/16 0539 11/04/16 0627  WBC 9.7 7.8 9.6  NEUTROABS 7.7  --   --   HGB 8.4* 8.1*  7.9*  HCT 25.8* 25.2* 24.4*  MCV 87.5 87.8 88.1  PLT 247 246 279   Cardiac Enzymes: No results for input(s): CKTOTAL, CKMB, CKMBINDEX, TROPONINI in the last 168 hours. BNP: Invalid input(s): POCBNP CBG:  Recent Labs Lab 11/02/16 1931  GLUCAP 91   D-Dimer No results for input(s): DDIMER in the last 72 hours. Hgb A1c No results for input(s): HGBA1C in the last 72 hours. Lipid Profile No results for input(s): CHOL, HDL, LDLCALC, TRIG, CHOLHDL, LDLDIRECT in the last 72 hours. Thyroid function studies No results for input(s): TSH, T4TOTAL, T3FREE, THYROIDAB in the last 72 hours.  Invalid input(s): FREET3 Anemia work up No results for input(s): VITAMINB12, FOLATE, FERRITIN, TIBC, IRON, RETICCTPCT in the last 72 hours. Urinalysis    Component Value Date/Time   COLORURINE YELLOW 11/02/2016 0005   APPEARANCEUR CLEAR 11/02/2016 0005   LABSPEC 1.016 11/02/2016 0005   PHURINE 8.0 11/02/2016 0005   GLUCOSEU 100 (A) 11/02/2016 0005   HGBUR TRACE (A) 11/02/2016 0005   BILIRUBINUR NEGATIVE 11/02/2016 0005   BILIRUBINUR neg 08/11/2013 1646   KETONESUR NEGATIVE 11/02/2016 0005   PROTEINUR >300 (A) 11/02/2016 0005   UROBILINOGEN 0.2 09/30/2015 1203   NITRITE NEGATIVE 11/02/2016 0005   LEUKOCYTESUR NEGATIVE 11/02/2016 0005   Sepsis Labs Invalid input(s): PROCALCITONIN,  WBC,  LACTICIDVEN Microbiology Recent Results (from the past 240 hour(s))  Culture, blood (Routine x 2)     Status: None (Preliminary result)   Collection Time: 11/02/16  3:33 PM  Result Value Ref Range Status   Specimen Description BLOOD RIGHT FOREARM  Final   Special Requests BOTTLES DRAWN AEROBIC AND ANAEROBIC 5CC  Final   Culture NO GROWTH < 24 HOURS  Final   Report Status PENDING  Incomplete  Culture, blood (Routine x 2)     Status: None (Preliminary result)   Collection Time: 11/02/16  4:30 PM  Result Value Ref Range Status   Specimen Description BLOOD RIGHT ANTECUBITAL  Final   Special Requests BOTTLES  DRAWN AEROBIC AND ANAEROBIC 5CC  Final   Culture NO GROWTH < 24 HOURS  Final   Report Status PENDING  Incomplete  Culture, blood (single)     Status: None (Preliminary result)   Collection Time: 11/02/16  6:40 PM  Result Value Ref Range Status   Specimen Description BLOOD RIGHT HEMODIALYSIS CATHETER  Final   Special Requests BOTTLES DRAWN AEROBIC ONLY 5CC  Final   Culture NO GROWTH < 24 HOURS  Final   Report Status PENDING  Incomplete  MRSA PCR Screening     Status: None   Collection Time: 11/02/16  7:19 PM  Result Value Ref Range Status   MRSA by PCR NEGATIVE NEGATIVE Final    Comment:        The GeneXpert MRSA Assay (FDA approved for NASAL specimens only), is one component of a comprehensive MRSA colonization surveillance program. It is not intended to diagnose MRSA infection nor to guide or monitor treatment for MRSA infections.     SIGNED:  Latrelle Dodrill, MD  [facility] 11/04/2016, 1:17 PM Pager   If 7PM-7AM, please contact night-coverage [email or web address] Password [email or web address]

## 2016-11-07 ENCOUNTER — Ambulatory Visit: Payer: 59 | Admitting: Rehabilitation

## 2016-11-07 ENCOUNTER — Encounter: Payer: Self-pay | Admitting: Rehabilitation

## 2016-11-07 LAB — CULTURE, BLOOD (SINGLE): Culture: NO GROWTH

## 2016-11-07 LAB — CULTURE, BLOOD (ROUTINE X 2)
CULTURE: NO GROWTH
Culture: NO GROWTH

## 2016-11-07 NOTE — Therapy (Signed)
[facility] [address] [place], [place], [address] Phone: [phone]   Fax:  [phone]  Patient Details  Name: Wesley Burns MRN: [id number] Date of Birth: 21-Apr-1966 Referring Provider:  No ref. provider found  Encounter Date: 11/07/2016   PHYSICAL THERAPY DISCHARGE SUMMARY  Visits from Start of Care: 3  Current functional level related to goals / functional outcomes: Unsure as pt did not return to therapy due to medical condition and high copay   Remaining deficits:     PT Long Term Goals - 09/30/16 1524      PT LONG TERM GOAL #1   Title Pt will be independent with HEP in order to indicate improved functional mobility and decreased fall risk.  (Target date: 11/25/16)   Time 8   Period Weeks   Status New     PT LONG TERM GOAL #2   Title Pt will improve BERG balance test score to 18/56 in order to indicate decreased fall risk.     Time 8   Period Weeks   Status New     PT LONG TERM GOAL #3   Title Pt will ambulate 500' over unlevel paved surfaces with LRAD at mod I level in order to indicate safe return to community mobility.     Time 8   Period Weeks   Status New     PT LONG TERM GOAL #4   Title Pt will perform 8/10 sit<>stand without UE support at mod I level in order to indicate improved functional strength.     Time 8   Period Weeks   Status New     PT LONG TERM GOAL #5   Title Pt will perform floor recovery at S level in order to indicate safety in case of fall at home.    Time 8   Period Weeks   Status New       Education / Equipment: HEP  Plan: Patient agrees to discharge.  Patient goals were not met. Patient is being discharged due to a change in medical status.  ????? and high copay        Cameron Sprang, PT, MPT [facility] [address] [place], [place], [address] Phone: [phone]   Fax:  [phone] 11/07/16, 8:36 AM

## 2016-11-14 ENCOUNTER — Ambulatory Visit: Payer: 59 | Admitting: Rehabilitation

## 2016-11-21 ENCOUNTER — Ambulatory Visit: Payer: 59 | Admitting: Rehabilitation

## 2016-11-28 ENCOUNTER — Ambulatory Visit: Payer: 59 | Admitting: Rehabilitation

## 2016-12-04 ENCOUNTER — Encounter (INDEPENDENT_AMBULATORY_CARE_PROVIDER_SITE_OTHER): Payer: 59 | Admitting: Ophthalmology

## 2016-12-18 ENCOUNTER — Encounter (INDEPENDENT_AMBULATORY_CARE_PROVIDER_SITE_OTHER): Payer: 59 | Admitting: Ophthalmology

## 2016-12-20 ENCOUNTER — Encounter (INDEPENDENT_AMBULATORY_CARE_PROVIDER_SITE_OTHER): Payer: 59 | Admitting: Ophthalmology

## 2016-12-20 DIAGNOSIS — E11319 Type 2 diabetes mellitus with unspecified diabetic retinopathy without macular edema: Secondary | ICD-10-CM | POA: Diagnosis not present

## 2016-12-20 DIAGNOSIS — I10 Essential (primary) hypertension: Secondary | ICD-10-CM

## 2016-12-20 DIAGNOSIS — H2513 Age-related nuclear cataract, bilateral: Secondary | ICD-10-CM | POA: Diagnosis not present

## 2016-12-20 DIAGNOSIS — H353132 Nonexudative age-related macular degeneration, bilateral, intermediate dry stage: Secondary | ICD-10-CM | POA: Diagnosis not present

## 2016-12-20 DIAGNOSIS — H43813 Vitreous degeneration, bilateral: Secondary | ICD-10-CM

## 2016-12-20 DIAGNOSIS — E113393 Type 2 diabetes mellitus with moderate nonproliferative diabetic retinopathy without macular edema, bilateral: Secondary | ICD-10-CM | POA: Diagnosis not present

## 2016-12-20 DIAGNOSIS — H35033 Hypertensive retinopathy, bilateral: Secondary | ICD-10-CM | POA: Diagnosis not present

## 2016-12-25 ENCOUNTER — Ambulatory Visit (INDEPENDENT_AMBULATORY_CARE_PROVIDER_SITE_OTHER): Payer: 59 | Admitting: Podiatry

## 2016-12-25 ENCOUNTER — Encounter: Payer: Self-pay | Admitting: Podiatry

## 2016-12-25 ENCOUNTER — Ambulatory Visit (INDEPENDENT_AMBULATORY_CARE_PROVIDER_SITE_OTHER): Payer: 59

## 2016-12-25 VITALS — BP 157/98 | HR 77

## 2016-12-25 DIAGNOSIS — E0843 Diabetes mellitus due to underlying condition with diabetic autonomic (poly)neuropathy: Secondary | ICD-10-CM | POA: Diagnosis not present

## 2016-12-25 DIAGNOSIS — L603 Nail dystrophy: Secondary | ICD-10-CM

## 2016-12-25 DIAGNOSIS — M79672 Pain in left foot: Secondary | ICD-10-CM

## 2016-12-25 DIAGNOSIS — M79671 Pain in right foot: Secondary | ICD-10-CM | POA: Diagnosis not present

## 2016-12-25 DIAGNOSIS — Z89431 Acquired absence of right foot: Secondary | ICD-10-CM

## 2016-12-25 DIAGNOSIS — E11610 Type 2 diabetes mellitus with diabetic neuropathic arthropathy: Secondary | ICD-10-CM

## 2016-12-25 DIAGNOSIS — M79609 Pain in unspecified limb: Secondary | ICD-10-CM | POA: Diagnosis not present

## 2016-12-25 DIAGNOSIS — L608 Other nail disorders: Secondary | ICD-10-CM

## 2016-12-25 DIAGNOSIS — B351 Tinea unguium: Secondary | ICD-10-CM | POA: Diagnosis not present

## 2017-01-05 NOTE — Progress Notes (Signed)
   SUBJECTIVE Patient with a history of diabetes mellitus presents to office today complaining of elongated, thickened nails. Pain while ambulating in shoes. Patient is unable to trim their own nails.  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  OBJECTIVE General Patient is awake, alert, and oriented x 3 and in no acute distress. Derm Skin is dry and supple bilateral. Negative open lesions or macerations. Remaining integument unremarkable. Nails are tender, long, thickened and dystrophic with subungual debris, consistent with onychomycosis, 1-5 left foot. No signs of infection noted. Vasc  DP and PT pedal pulses diminished bilaterally. Temperature gradient within normal limits.  Neuro Epicritic and protective threshold sensation diminished bilaterally.  Musculoskeletal Exam No symptomatic pedal deformities noted bilateral. Muscular strength within normal limits.  ASSESSMENT 1. Diabetes Mellitus w/ peripheral neuropathy 2. Onychomycosis of nail due to dermatophyte left foot 3. History of Lisfranc amputation right foot  PLAN OF CARE 1. Patient evaluated today. 2. Instructed to maintain good pedal hygiene and foot care. Stressed importance of controlling blood sugar.  3. Mechanical debridement of nails 1-5 left foot performed using a nail nipper. Filed with dremel without incident.  4. Authorization for diabetic shoes was initiated today 5. Patient will require a toe filler to the right foot 6. Appointment with Fenton Foy for a CROW boot to the left lower extremity.  7. Return to clinic in 3 months    Felecia Shelling, DPM [facility]  Dr. Felecia Shelling, DPM    [address]                                        [place], [place] [address]                Office [phone]  Fax [phone]

## 2017-01-06 ENCOUNTER — Other Ambulatory Visit: Payer: 59

## 2017-02-03 ENCOUNTER — Other Ambulatory Visit: Payer: 59

## 2017-02-17 ENCOUNTER — Encounter (INDEPENDENT_AMBULATORY_CARE_PROVIDER_SITE_OTHER): Payer: 59 | Admitting: Ophthalmology

## 2017-03-26 ENCOUNTER — Ambulatory Visit (INDEPENDENT_AMBULATORY_CARE_PROVIDER_SITE_OTHER): Payer: 59 | Admitting: Podiatry

## 2017-03-26 DIAGNOSIS — B351 Tinea unguium: Secondary | ICD-10-CM | POA: Diagnosis not present

## 2017-03-26 DIAGNOSIS — M79676 Pain in unspecified toe(s): Secondary | ICD-10-CM | POA: Diagnosis not present

## 2017-03-26 DIAGNOSIS — E0842 Diabetes mellitus due to underlying condition with diabetic polyneuropathy: Secondary | ICD-10-CM

## 2017-03-26 DIAGNOSIS — L603 Nail dystrophy: Secondary | ICD-10-CM

## 2017-03-26 DIAGNOSIS — M79609 Pain in unspecified limb: Principal | ICD-10-CM

## 2017-03-26 DIAGNOSIS — L608 Other nail disorders: Secondary | ICD-10-CM

## 2017-03-28 NOTE — Progress Notes (Signed)
   SUBJECTIVE Patient with a history of diabetes mellitus presents to office today complaining of elongated, thickened nails of the left foot. Pt has a right foot Chopart amputation. Pain while wearing shoes. Patient is unable to trim their own nails.   OBJECTIVE General Patient is awake, alert, and oriented x 3 and in no acute distress. Derm Skin is dry and supple left. Negative open lesions or macerations. Remaining integument unremarkable. Nails are tender, long, thickened and dystrophic with subungual debris, consistent with onychomycosis, 1-5 left foot. No signs of infection noted. Vasc  DP and PT pedal pulses palpable of left foot. Temperature gradient within normal limits.  Neuro Epicritic and protective threshold sensation diminished bilaterally.  Musculoskeletal Exam No symptomatic pedal deformities noted to left foot. Muscular strength within normal limits. Right foot Chopart amputation.  ASSESSMENT 1. Diabetes Mellitus w/ peripheral neuropathy 2. Onychomycosis of nail due to dermatophyte of left foot 3. Pain in left foot  4. History of right foot Chopart amputation  PLAN OF CARE 1. Patient evaluated today. 2. Instructed to maintain good pedal hygiene and foot care. Stressed importance of controlling blood sugar.  3. Mechanical debridement of nails 1-5 of left foot performed using a nail nipper. Filed with dremel without incident.  4. Return to clinic in 3 mos.    Felecia Shelling, DPM [facility]  Dr. Felecia Shelling, DPM    [address]                                        [place], [place] [address]                Office [phone]  Fax [phone]

## 2017-04-09 ENCOUNTER — Other Ambulatory Visit: Payer: 59

## 2017-04-16 ENCOUNTER — Ambulatory Visit: Payer: 59 | Admitting: *Deleted

## 2017-04-17 NOTE — Progress Notes (Unsigned)
Patient was seen for final fitting for custom diabetic shoes and toe filler (lisfranc).  Patient was evaluated for fit and function.  Shoes fit very well and filler helped immediately with balance.  Noticed gait and length of stride increased in shoes.   Patient was pleased and advised of care. Follow up PRN  Codes to be billed L5000 x 1 and L3230 x 2

## 2017-05-06 DIAGNOSIS — M79676 Pain in unspecified toe(s): Secondary | ICD-10-CM

## 2017-06-30 ENCOUNTER — Ambulatory Visit (INDEPENDENT_AMBULATORY_CARE_PROVIDER_SITE_OTHER): Payer: 59 | Admitting: Podiatry

## 2017-06-30 DIAGNOSIS — Z89431 Acquired absence of right foot: Secondary | ICD-10-CM

## 2017-06-30 DIAGNOSIS — B351 Tinea unguium: Secondary | ICD-10-CM

## 2017-06-30 DIAGNOSIS — E0842 Diabetes mellitus due to underlying condition with diabetic polyneuropathy: Secondary | ICD-10-CM

## 2017-06-30 DIAGNOSIS — M79676 Pain in unspecified toe(s): Secondary | ICD-10-CM

## 2017-06-30 NOTE — Progress Notes (Signed)
   SUBJECTIVE Patient with a history of diabetes mellitus presents to office today complaining of elongated, thickened nails of the left foot. Pt has a right foot Chopart amputation. Pain while wearing shoes. Patient is unable to trim their own nails.   OBJECTIVE General Patient is awake, alert, and oriented x 3 and in no acute distress. Derm Skin is dry and supple left. Negative open lesions or macerations. Remaining integument unremarkable. Nails are tender, long, thickened and dystrophic with subungual debris, consistent with onychomycosis, 1-5 left foot. No signs of infection noted. Vasc  DP and PT pedal pulses palpable of left foot. Temperature gradient within normal limits.  Neuro Epicritic and protective threshold sensation diminished bilaterally.  Musculoskeletal Exam No symptomatic pedal deformities noted to left foot. Muscular strength within normal limits. Right foot Chopart amputation.  ASSESSMENT 1. Diabetes Mellitus w/ peripheral neuropathy 2. Onychomycosis of nail due to dermatophyte of left foot 3. Pain in left foot  4. History of right foot Chopart amputation  PLAN OF CARE 1. Patient evaluated today. 2. Instructed to maintain good pedal hygiene and foot care. Stressed importance of controlling blood sugar.  3. Mechanical debridement of nails 1-5 of left foot performed using a nail nipper. Filed with dremel without incident.  4. Recommend the patient continue wearing his custom molded probe boots. He is going to try to have them professionally modify to replace the lace up straps with Velcro straps.  5. Return to clinic in 3 mos.    Felecia Shelling, DPM [facility]  Dr. Felecia Shelling, DPM    [address]                                        [place], [place] [address]                Office [phone]  Fax [phone]

## 2017-09-22 ENCOUNTER — Ambulatory Visit: Payer: 59 | Admitting: Podiatry

## 2017-09-29 ENCOUNTER — Ambulatory Visit: Payer: 59 | Admitting: Podiatry

## 2017-10-06 ENCOUNTER — Ambulatory Visit: Payer: 59 | Admitting: Podiatry

## 2017-10-06 ENCOUNTER — Ambulatory Visit (INDEPENDENT_AMBULATORY_CARE_PROVIDER_SITE_OTHER): Payer: 59

## 2017-10-06 ENCOUNTER — Encounter: Payer: Self-pay | Admitting: Podiatry

## 2017-10-06 VITALS — BP 164/95 | HR 75

## 2017-10-06 DIAGNOSIS — E11610 Type 2 diabetes mellitus with diabetic neuropathic arthropathy: Secondary | ICD-10-CM

## 2017-10-06 DIAGNOSIS — L97521 Non-pressure chronic ulcer of other part of left foot limited to breakdown of skin: Secondary | ICD-10-CM | POA: Diagnosis not present

## 2017-10-06 DIAGNOSIS — L98491 Non-pressure chronic ulcer of skin of other sites limited to breakdown of skin: Secondary | ICD-10-CM | POA: Diagnosis not present

## 2017-10-06 MED ORDER — SILVER SULFADIAZINE 1 % EX CREA: 1.0000 "application " | TOPICAL_CREAM | Freq: Every day | CUTANEOUS | 0 refills | Status: DC

## 2017-10-06 NOTE — Progress Notes (Signed)
   Subjective:    Patient ID: Wesley Burns, male    DOB: 03-30-1966, 52 y.o.   MRN: [id number]  HPI This patient presents today complaining of a draining full plantar skin wound on the left foot present for approximately 3 weeks. He's noticed the drainage has increased slightly over time and is applied a topical cream prescribed previously for a skin ulcer. 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 Patient has a history of diabetes and neuropathy Patient  still smoking   Review of Systems  All other systems reviewed and are negative.      Objective:   Physical Exam  Orientated 3  Vascular: DP and PT pulses 2/4 bilaterally Reflex delay bilaterally  Neurological: Sensation to 10 g monofilament wire intact 0/5 right and 0/8 left Vibratory sensation nonreactive bilaterally Ankle reflexes reactive bilaterally  Dermatological: Well-healed surgical scar from Chopart's amputation right Atrophic skin without hair growth bilaterally Hyperkeratotic lesion plantar sub-left first MPJ. Debridement reveals a 10 mm ulcer with granulation tissue. The wound does not probe deep. 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  X-ray examination weightbearing left foot dated 10/06/2017  Intact bony structure without fracture and/or dislocation Decreased bone density noted in all views Osteoarthropathy with deformity midfoot Hammertoe 1-5 No cortical disruption noted in around the first MPJ left No increased soft tissue density left first MPJ left  Radiographic impression: Charcot's osteoarthropathy with deformity left foot No x-ray sign of osteomyelitis in left first MPJ       Assessment & Plan:   Assessment: Diabetic peripheral  neuropathy Osteoarthropathy with deformity left Superficial ulcer left first MPJ without clinical sign of infection  Plan: Debrided ulcer and apply Silvadene dressing Attach additional felt padding to Plastizote insole on left foot to offload plantar first MPJ Arrange for transfer to wound care for ulceration on plantar left first MPJ Until wound care transfer is arrange reevaluate 7 days Patient instructed present to emergency department if you notice any sudden increase in pain, swelling, redness, fever

## 2017-10-06 NOTE — Patient Instructions (Signed)
Apply Silvadene cream to skin ulcer the left foot daily and cover with gauze Where your existing diabetic shoe with additional padding to offload pressure off the ulcer on the left foot if you notice any sudden increase in pain, swelling, redness, fever presents to emergency department Contact the wound care to arrange transfer and care  Diabetes and Foot Care Diabetes may cause you to have problems because of poor blood supply (circulation) to your feet and legs. This may cause the skin on your feet to become thinner, break easier, and heal more slowly. Your skin may become dry, and the skin may peel and crack. You may also have nerve damage in your legs and feet causing decreased feeling in them. You may not notice minor injuries to your feet that could lead to infections or more serious problems. Taking care of your feet is one of the most important things you can do for yourself. Follow these instructions at home:  Wear shoes at all times, even in the house. Do not go barefoot. Bare feet are easily injured.  Check your feet daily for blisters, cuts, and redness. If you cannot see the bottom of your feet, use a mirror or ask someone for help.  Wash your feet with warm water (do not use hot water) and mild soap. Then pat your feet and the areas between your toes until they are completely dry. Do not soak your feet as this can dry your skin.  Apply a moisturizing lotion or petroleum jelly (that does not contain alcohol and is unscented) to the skin on your feet and to dry, brittle toenails. Do not apply lotion between your toes.  Trim your toenails straight across. Do not dig under them or around the cuticle. File the edges of your nails with an emery board or nail file.  Do not cut corns or calluses or try to remove them with medicine.  Wear clean socks or stockings every day. Make sure they are not too tight. Do not wear knee-high stockings since they may decrease blood flow to your  legs.  Wear shoes that fit properly and have enough cushioning. To break in new shoes, wear them for just a few hours a day. This prevents you from injuring your feet. Always look in your shoes before you put them on to be sure there are no objects inside.  Do not cross your legs. This may decrease the blood flow to your feet.  If you find a minor scrape, cut, or break in the skin on your feet, keep it and the skin around it clean and dry. These areas may be cleansed with mild soap and water. Do not cleanse the area with peroxide, alcohol, or iodine.  When you remove an adhesive bandage, be sure not to damage the skin around it.  If you have a wound, look at it several times a day to make sure it is healing.  Do not use heating pads or hot water bottles. They may burn your skin. If you have lost feeling in your feet or legs, you may not know it is happening until it is too late.  Make sure your health care provider performs a complete foot exam at least annually or more often if you have foot problems. Report any cuts, sores, or bruises to your health care provider immediately. Contact a health care provider if:  You have an injury that is not healing.  You have cuts or breaks in the skin.  You  have an ingrown nail.  You notice redness on your legs or feet.  You feel burning or tingling in your legs or feet.  You have pain or cramps in your legs and feet.  Your legs or feet are numb.  Your feet always feel cold. Get help right away if:  There is increasing redness, swelling, or pain in or around a wound.  There is a red line that goes up your leg.  Pus is coming from a wound.  You develop a fever or as directed by your health care provider.  You notice a bad smell coming from an ulcer or wound. This information is not intended to replace advice given to you by your health care provider. Make sure you discuss any questions you have with your health care provider. Document  Released: 11/15/2000 Document Revised: 04/25/2016 Document Reviewed: 04/27/2013 Elsevier Interactive Patient Education  2017 [employer].

## 2017-10-07 ENCOUNTER — Telehealth: Payer: Self-pay | Admitting: *Deleted

## 2017-10-07 NOTE — Telephone Encounter (Signed)
Faxed required form, clinicals and demographics to [facility]. 

## 2017-10-13 ENCOUNTER — Ambulatory Visit: Payer: 59 | Admitting: Podiatry

## 2017-11-03 ENCOUNTER — Encounter: Payer: Self-pay | Admitting: Podiatry

## 2017-11-03 ENCOUNTER — Ambulatory Visit: Payer: 59 | Admitting: Podiatry

## 2017-11-03 VITALS — BP 200/103 | HR 74

## 2017-11-03 DIAGNOSIS — L97521 Non-pressure chronic ulcer of other part of left foot limited to breakdown of skin: Secondary | ICD-10-CM | POA: Diagnosis not present

## 2017-11-03 DIAGNOSIS — E1142 Type 2 diabetes mellitus with diabetic polyneuropathy: Secondary | ICD-10-CM

## 2017-11-03 NOTE — Patient Instructions (Signed)
Continue apply Silvadene cream and gauze pad to the skin ulcer daily  Diabetes and Foot Care Diabetes may cause you to have problems because of poor blood supply (circulation) to your feet and legs. This may cause the skin on your feet to become thinner, break easier, and heal more slowly. Your skin may become dry, and the skin may peel and crack. You may also have nerve damage in your legs and feet causing decreased feeling in them. You may not notice minor injuries to your feet that could lead to infections or more serious problems. Taking care of your feet is one of the most important things you can do for yourself. Follow these instructions at home:  Wear shoes at all times, even in the house. Do not go barefoot. Bare feet are easily injured.  Check your feet daily for blisters, cuts, and redness. If you cannot see the bottom of your feet, use a mirror or ask someone for help.  Wash your feet with warm water (do not use hot water) and mild soap. Then pat your feet and the areas between your toes until they are completely dry. Do not soak your feet as this can dry your skin.  Apply a moisturizing lotion or petroleum jelly (that does not contain alcohol and is unscented) to the skin on your feet and to dry, brittle toenails. Do not apply lotion between your toes.  Trim your toenails straight across. Do not dig under them or around the cuticle. File the edges of your nails with an emery board or nail file.  Do not cut corns or calluses or try to remove them with medicine.  Wear clean socks or stockings every day. Make sure they are not too tight. Do not wear knee-high stockings since they may decrease blood flow to your legs.  Wear shoes that fit properly and have enough cushioning. To break in new shoes, wear them for just a few hours a day. This prevents you from injuring your feet. Always look in your shoes before you put them on to be sure there are no objects inside.  Do not cross your  legs. This may decrease the blood flow to your feet.  If you find a minor scrape, cut, or break in the skin on your feet, keep it and the skin around it clean and dry. These areas may be cleansed with mild soap and water. Do not cleanse the area with peroxide, alcohol, or iodine.  When you remove an adhesive bandage, be sure not to damage the skin around it.  If you have a wound, look at it several times a day to make sure it is healing.  Do not use heating pads or hot water bottles. They may burn your skin. If you have lost feeling in your feet or legs, you may not know it is happening until it is too late.  Make sure your health care provider performs a complete foot exam at least annually or more often if you have foot problems. Report any cuts, sores, or bruises to your health care provider immediately. Contact a health care provider if:  You have an injury that is not healing.  You have cuts or breaks in the skin.  You have an ingrown nail.  You notice redness on your legs or feet.  You feel burning or tingling in your legs or feet.  You have pain or cramps in your legs and feet.  Your legs or feet are numb.  Your  feet always feel cold. Get help right away if:  There is increasing redness, swelling, or pain in or around a wound.  There is a red line that goes up your leg.  Pus is coming from a wound.  You develop a fever or as directed by your health care provider.  You notice a bad smell coming from an ulcer or wound. This information is not intended to replace advice given to you by your health care provider. Make sure you discuss any questions you have with your health care provider. Document Released: 11/15/2000 Document Revised: 04/25/2016 Document Reviewed: 04/27/2013 Elsevier Interactive Patient Education  2017 [employer].

## 2017-11-03 NOTE — Progress Notes (Signed)
Patient ID: Wesley Burns, male   DOB: 22-Oct-1966, 51 y.o.   MRN: [id number]   Subjective: This patient presents today complaining of a draining full plantar skin wound on the left foot present for approximately 3 weeks. He's noticed the drainage has increased slightly over time and is applied a topical cream prescribed previously for a skin ulcer. 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 Patient has a history of diabetes and neuropathy Patient  still smoking  Patient has pending appointment at wound care on 11/17/2017  Objective: Orientated 3  Vascular: DP and PT pulses 2/4 bilaterally Reflex delay bilaterally  Neurological: Sensation to 10 g monofilament wire intact 0/5 right and 0/8 left Vibratory sensation nonreactive bilaterally Ankle reflexes reactive bilaterally  Dermatological: Well-healed surgical scar from Chopart's amputation right Atrophic skin without hair growth bilaterally Hyperkeratotic lesion plantar sub-left first MPJ. Debridement reveals a 54mm (10 mm ulcer size of the original lesion) with granulation tissue. The wound does not probe deep. 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  X-ray examination weightbearing left foot dated 10/06/2017  Intact bony structure without fracture and/or dislocation Decreased bone density noted in all views Osteoarthropathy with deformity midfoot Hammertoe 1-5 No cortical disruption noted in around the first MPJ left No increased soft tissue density left first MPJ left  Radiographic impression: Charcot's osteoarthropathy with deformity left foot No x-ray sign of osteomyelitis in left first MPJ  Assessment: Diabetic peripheral neuropathy Osteoarthropathy with deformity left Superficial  ulcer left first MPJ without clinical sign of infection, reducing in size from start of 10 mm to 5 mm  Plan: Debrided ulcer and apply Silvadene dressing Attach additional felt padding to Plastizote insole on left foot to offload plantar first MPJ Arrange for transfer to wound care for ulceration on plantar left first MPJ Until wound care transfer is arrange reevaluate 7 days Patient instructed present to emergency department if you notice any sudden increase in pain, swelling, redness, fever  Reappoint 7 days

## 2017-11-06 ENCOUNTER — Other Ambulatory Visit: Payer: Self-pay | Admitting: Family Medicine

## 2017-11-06 DIAGNOSIS — K7030 Alcoholic cirrhosis of liver without ascites: Secondary | ICD-10-CM

## 2017-11-10 ENCOUNTER — Ambulatory Visit: Payer: 59 | Admitting: Podiatry

## 2017-11-17 ENCOUNTER — Encounter (HOSPITAL_BASED_OUTPATIENT_CLINIC_OR_DEPARTMENT_OTHER): Payer: 59

## 2017-12-07 ENCOUNTER — Encounter (HOSPITAL_COMMUNITY): Payer: Self-pay | Admitting: Pharmacy Technician

## 2017-12-07 ENCOUNTER — Emergency Department (HOSPITAL_COMMUNITY): Payer: Self-pay

## 2017-12-07 ENCOUNTER — Inpatient Hospital Stay (HOSPITAL_COMMUNITY)
Admission: EM | Admit: 2017-12-07 | Discharge: 2018-01-11 | DRG: 064 | Disposition: A | Discharge status: Skilled Nursing Facility | Payer: 59 | Attending: Family Medicine | Admitting: Family Medicine

## 2017-12-07 DIAGNOSIS — G8194 Hemiplegia, unspecified affecting left nondominant side: Secondary | ICD-10-CM | POA: Diagnosis present

## 2017-12-07 DIAGNOSIS — R571 Hypovolemic shock: Secondary | ICD-10-CM | POA: Diagnosis present

## 2017-12-07 DIAGNOSIS — E1151 Type 2 diabetes mellitus with diabetic peripheral angiopathy without gangrene: Secondary | ICD-10-CM | POA: Diagnosis present

## 2017-12-07 DIAGNOSIS — K5900 Constipation, unspecified: Secondary | ICD-10-CM | POA: Diagnosis not present

## 2017-12-07 DIAGNOSIS — M62838 Other muscle spasm: Secondary | ICD-10-CM | POA: Diagnosis not present

## 2017-12-07 DIAGNOSIS — I120 Hypertensive chronic kidney disease with stage 5 chronic kidney disease or end stage renal disease: Secondary | ICD-10-CM | POA: Diagnosis present

## 2017-12-07 DIAGNOSIS — K819 Cholecystitis, unspecified: Secondary | ICD-10-CM

## 2017-12-07 DIAGNOSIS — I615 Nontraumatic intracerebral hemorrhage, intraventricular: Secondary | ICD-10-CM

## 2017-12-07 DIAGNOSIS — I618 Other nontraumatic intracerebral hemorrhage: Secondary | ICD-10-CM | POA: Diagnosis present

## 2017-12-07 DIAGNOSIS — I6930 Unspecified sequelae of cerebral infarction: Secondary | ICD-10-CM

## 2017-12-07 DIAGNOSIS — R471 Dysarthria and anarthria: Secondary | ICD-10-CM | POA: Diagnosis present

## 2017-12-07 DIAGNOSIS — M25512 Pain in left shoulder: Secondary | ICD-10-CM

## 2017-12-07 DIAGNOSIS — R0602 Shortness of breath: Secondary | ICD-10-CM

## 2017-12-07 DIAGNOSIS — B9620 Unspecified Escherichia coli [E. coli] as the cause of diseases classified elsewhere: Secondary | ICD-10-CM | POA: Diagnosis present

## 2017-12-07 DIAGNOSIS — E1142 Type 2 diabetes mellitus with diabetic polyneuropathy: Secondary | ICD-10-CM | POA: Diagnosis present

## 2017-12-07 DIAGNOSIS — N2581 Secondary hyperparathyroidism of renal origin: Secondary | ICD-10-CM | POA: Diagnosis present

## 2017-12-07 DIAGNOSIS — Z89439 Acquired absence of unspecified foot: Secondary | ICD-10-CM

## 2017-12-07 DIAGNOSIS — R6521 Severe sepsis with septic shock: Secondary | ICD-10-CM | POA: Diagnosis not present

## 2017-12-07 DIAGNOSIS — Z8673 Personal history of transient ischemic attack (TIA), and cerebral infarction without residual deficits: Secondary | ICD-10-CM

## 2017-12-07 DIAGNOSIS — K8010 Calculus of gallbladder with chronic cholecystitis without obstruction: Secondary | ICD-10-CM | POA: Diagnosis present

## 2017-12-07 DIAGNOSIS — I6381 Other cerebral infarction due to occlusion or stenosis of small artery: Secondary | ICD-10-CM | POA: Diagnosis present

## 2017-12-07 DIAGNOSIS — D631 Anemia in chronic kidney disease: Secondary | ICD-10-CM | POA: Diagnosis present

## 2017-12-07 DIAGNOSIS — R29810 Facial weakness: Secondary | ICD-10-CM | POA: Diagnosis present

## 2017-12-07 DIAGNOSIS — K851 Biliary acute pancreatitis without necrosis or infection: Secondary | ICD-10-CM | POA: Diagnosis not present

## 2017-12-07 DIAGNOSIS — Z79899 Other long term (current) drug therapy: Secondary | ICD-10-CM

## 2017-12-07 DIAGNOSIS — F329 Major depressive disorder, single episode, unspecified: Secondary | ICD-10-CM | POA: Diagnosis present

## 2017-12-07 DIAGNOSIS — I69391 Dysphagia following cerebral infarction: Secondary | ICD-10-CM

## 2017-12-07 DIAGNOSIS — N400 Enlarged prostate without lower urinary tract symptoms: Secondary | ICD-10-CM | POA: Diagnosis present

## 2017-12-07 DIAGNOSIS — I619 Nontraumatic intracerebral hemorrhage, unspecified: Secondary | ICD-10-CM | POA: Diagnosis present

## 2017-12-07 DIAGNOSIS — E11610 Type 2 diabetes mellitus with diabetic neuropathic arthropathy: Secondary | ICD-10-CM | POA: Diagnosis present

## 2017-12-07 DIAGNOSIS — R7881 Bacteremia: Secondary | ICD-10-CM | POA: Diagnosis present

## 2017-12-07 DIAGNOSIS — E1129 Type 2 diabetes mellitus with other diabetic kidney complication: Secondary | ICD-10-CM | POA: Diagnosis present

## 2017-12-07 DIAGNOSIS — A4151 Sepsis due to Escherichia coli [E. coli]: Secondary | ICD-10-CM | POA: Diagnosis not present

## 2017-12-07 DIAGNOSIS — K567 Ileus, unspecified: Secondary | ICD-10-CM | POA: Diagnosis not present

## 2017-12-07 DIAGNOSIS — K8012 Calculus of gallbladder with acute and chronic cholecystitis without obstruction: Secondary | ICD-10-CM

## 2017-12-07 DIAGNOSIS — K56609 Unspecified intestinal obstruction, unspecified as to partial versus complete obstruction: Secondary | ICD-10-CM

## 2017-12-07 DIAGNOSIS — M109 Gout, unspecified: Secondary | ICD-10-CM | POA: Diagnosis present

## 2017-12-07 DIAGNOSIS — A4159 Other Gram-negative sepsis: Secondary | ICD-10-CM | POA: Diagnosis present

## 2017-12-07 DIAGNOSIS — R1084 Generalized abdominal pain: Secondary | ICD-10-CM

## 2017-12-07 DIAGNOSIS — Z89431 Acquired absence of right foot: Secondary | ICD-10-CM

## 2017-12-07 DIAGNOSIS — R1310 Dysphagia, unspecified: Secondary | ICD-10-CM | POA: Diagnosis present

## 2017-12-07 DIAGNOSIS — F17210 Nicotine dependence, cigarettes, uncomplicated: Secondary | ICD-10-CM | POA: Diagnosis present

## 2017-12-07 DIAGNOSIS — G936 Cerebral edema: Secondary | ICD-10-CM | POA: Diagnosis present

## 2017-12-07 DIAGNOSIS — Z87891 Personal history of nicotine dependence: Secondary | ICD-10-CM

## 2017-12-07 DIAGNOSIS — Z992 Dependence on renal dialysis: Secondary | ICD-10-CM

## 2017-12-07 DIAGNOSIS — Z96643 Presence of artificial hip joint, bilateral: Secondary | ICD-10-CM | POA: Diagnosis present

## 2017-12-07 DIAGNOSIS — E1122 Type 2 diabetes mellitus with diabetic chronic kidney disease: Secondary | ICD-10-CM | POA: Diagnosis present

## 2017-12-07 DIAGNOSIS — R748 Abnormal levels of other serum enzymes: Secondary | ICD-10-CM | POA: Diagnosis present

## 2017-12-07 DIAGNOSIS — I610 Nontraumatic intracerebral hemorrhage in hemisphere, subcortical: Principal | ICD-10-CM | POA: Diagnosis present

## 2017-12-07 DIAGNOSIS — I10 Essential (primary) hypertension: Secondary | ICD-10-CM | POA: Diagnosis present

## 2017-12-07 DIAGNOSIS — E871 Hypo-osmolality and hyponatremia: Secondary | ICD-10-CM | POA: Diagnosis present

## 2017-12-07 DIAGNOSIS — R509 Fever, unspecified: Secondary | ICD-10-CM

## 2017-12-07 DIAGNOSIS — R270 Ataxia, unspecified: Secondary | ICD-10-CM | POA: Diagnosis present

## 2017-12-07 DIAGNOSIS — F419 Anxiety disorder, unspecified: Secondary | ICD-10-CM | POA: Diagnosis present

## 2017-12-07 DIAGNOSIS — R109 Unspecified abdominal pain: Secondary | ICD-10-CM

## 2017-12-07 DIAGNOSIS — E875 Hyperkalemia: Secondary | ICD-10-CM | POA: Diagnosis not present

## 2017-12-07 DIAGNOSIS — K219 Gastro-esophageal reflux disease without esophagitis: Secondary | ICD-10-CM | POA: Diagnosis present

## 2017-12-07 DIAGNOSIS — K8011 Calculus of gallbladder with chronic cholecystitis with obstruction: Secondary | ICD-10-CM | POA: Diagnosis not present

## 2017-12-07 DIAGNOSIS — R29712 NIHSS score 12: Secondary | ICD-10-CM | POA: Diagnosis present

## 2017-12-07 DIAGNOSIS — E860 Dehydration: Secondary | ICD-10-CM | POA: Diagnosis present

## 2017-12-07 DIAGNOSIS — E785 Hyperlipidemia, unspecified: Secondary | ICD-10-CM | POA: Diagnosis present

## 2017-12-07 DIAGNOSIS — A419 Sepsis, unspecified organism: Secondary | ICD-10-CM

## 2017-12-07 DIAGNOSIS — I6621 Occlusion and stenosis of right posterior cerebral artery: Secondary | ICD-10-CM | POA: Diagnosis present

## 2017-12-07 DIAGNOSIS — Z87820 Personal history of traumatic brain injury: Secondary | ICD-10-CM

## 2017-12-07 DIAGNOSIS — N186 End stage renal disease: Secondary | ICD-10-CM | POA: Diagnosis present

## 2017-12-07 HISTORY — DX: Personal history of other diseases of the musculoskeletal system and connective tissue: Z87.39

## 2017-12-07 HISTORY — DX: Dependence on renal dialysis: Z99.2

## 2017-12-07 HISTORY — DX: Dependence on renal dialysis: N18.6

## 2017-12-07 HISTORY — DX: Rheumatoid arthritis, unspecified: M06.9

## 2017-12-07 LAB — I-STAT CHEM 8, ED
BUN: 26 mg/dL — AB (ref 6–20)
CHLORIDE: 93 mmol/L — AB (ref 101–111)
CREATININE: 10.3 mg/dL — AB (ref 0.61–1.24)
Calcium, Ion: 0.92 mmol/L — ABNORMAL LOW (ref 1.15–1.40)
GLUCOSE: 102 mg/dL — AB (ref 65–99)
HCT: 33 % — ABNORMAL LOW (ref 39.0–52.0)
Hemoglobin: 11.2 g/dL — ABNORMAL LOW (ref 13.0–17.0)
POTASSIUM: 3.6 mmol/L (ref 3.5–5.1)
Sodium: 128 mmol/L — ABNORMAL LOW (ref 135–145)
TCO2: 24 mmol/L (ref 22–32)

## 2017-12-07 LAB — COMPREHENSIVE METABOLIC PANEL
ALT: 17 U/L (ref 17–63)
AST: 18 U/L (ref 15–41)
Albumin: 3.4 g/dL — ABNORMAL LOW (ref 3.5–5.0)
Alkaline Phosphatase: 345 U/L — ABNORMAL HIGH (ref 38–126)
Anion gap: 14 (ref 5–15)
BILIRUBIN TOTAL: 1.2 mg/dL (ref 0.3–1.2)
BUN: 24 mg/dL — AB (ref 6–20)
CO2: 22 mmol/L (ref 22–32)
CREATININE: 9.59 mg/dL — AB (ref 0.61–1.24)
Calcium: 8.2 mg/dL — ABNORMAL LOW (ref 8.9–10.3)
Chloride: 91 mmol/L — ABNORMAL LOW (ref 101–111)
GFR calc Af Amer: 6 mL/min — ABNORMAL LOW (ref >60)
GFR, EST NON AFRICAN AMERICAN: 5 mL/min — AB (ref >60)
Glucose, Bld: 97 mg/dL (ref 65–99)
POTASSIUM: 3.4 mmol/L — AB (ref 3.5–5.1)
Sodium: 127 mmol/L — ABNORMAL LOW (ref 135–145)
TOTAL PROTEIN: 6.7 g/dL (ref 6.5–8.1)

## 2017-12-07 LAB — MRSA PCR SCREENING: MRSA by PCR: NEGATIVE

## 2017-12-07 LAB — PROTIME-INR
INR: 0.93
Prothrombin Time: 12.3 seconds (ref 11.4–15.2)

## 2017-12-07 LAB — DIFFERENTIAL
BASOS ABS: 0.1 10*3/uL (ref 0.0–0.1)
Basophils Relative: 1 %
EOS ABS: 0.2 10*3/uL (ref 0.0–0.7)
Eosinophils Relative: 3 %
LYMPHS ABS: 2.5 10*3/uL (ref 0.7–4.0)
Lymphocytes Relative: 37 %
MONOS PCT: 6 %
Monocytes Absolute: 0.4 10*3/uL (ref 0.1–1.0)
Neutro Abs: 3.7 10*3/uL (ref 1.7–7.7)
Neutrophils Relative %: 53 %

## 2017-12-07 LAB — CBG MONITORING, ED: Glucose-Capillary: 96 mg/dL (ref 65–99)

## 2017-12-07 LAB — CBC
HEMATOCRIT: 31.9 % — AB (ref 39.0–52.0)
HEMOGLOBIN: 10.4 g/dL — AB (ref 13.0–17.0)
MCH: 28.3 pg (ref 26.0–34.0)
MCHC: 32.6 g/dL (ref 30.0–36.0)
MCV: 86.7 fL (ref 78.0–100.0)
Platelets: 394 10*3/uL (ref 150–400)
RBC: 3.68 MIL/uL — ABNORMAL LOW (ref 4.22–5.81)
RDW: 16.5 % — ABNORMAL HIGH (ref 11.5–15.5)
WBC: 6.9 10*3/uL (ref 4.0–10.5)

## 2017-12-07 LAB — I-STAT TROPONIN, ED: TROPONIN I, POC: 0.01 ng/mL (ref 0.00–0.08)

## 2017-12-07 LAB — APTT: aPTT: 31 seconds (ref 24–36)

## 2017-12-07 MED ORDER — IOPAMIDOL (ISOVUE-370) INJECTION 76%
INTRAVENOUS | Status: AC
  Filled 2017-12-07: qty 50

## 2017-12-07 MED ORDER — NICARDIPINE HCL IN NACL 20-0.86 MG/200ML-% IV SOLN
3.0000 mg/h | INTRAVENOUS | Status: DC
  Administered 2017-12-07 (×2): 7.5 mg/h via INTRAVENOUS
  Administered 2017-12-07: 5 mg/h via INTRAVENOUS
  Administered 2017-12-08: 10 mg/h via INTRAVENOUS
  Administered 2017-12-08 (×5): 7.5 mg/h via INTRAVENOUS
  Filled 2017-12-07 (×9): qty 200

## 2017-12-07 MED ORDER — ACETAMINOPHEN 160 MG/5ML PO SOLN: 650.0000 mg | ORAL | Status: DC | PRN

## 2017-12-07 MED ORDER — SENNOSIDES-DOCUSATE SODIUM 8.6-50 MG PO TABS
1.0000 | ORAL_TABLET | Freq: Two times a day (BID) | ORAL | Status: DC
  Administered 2017-12-08 – 2018-01-06 (×41): 1 via ORAL
  Filled 2017-12-07 (×52): qty 1

## 2017-12-07 MED ORDER — ACETAMINOPHEN 325 MG PO TABS
650.0000 mg | ORAL_TABLET | ORAL | Status: DC | PRN
  Administered 2017-12-22 – 2018-01-08 (×6): 650 mg via ORAL
  Filled 2017-12-07 (×6): qty 2

## 2017-12-07 MED ORDER — ORAL CARE MOUTH RINSE
15.0000 mL | Freq: Two times a day (BID) | OROMUCOSAL | Status: DC
  Administered 2017-12-08: 15 mL via OROMUCOSAL

## 2017-12-07 MED ORDER — STROKE: EARLY STAGES OF RECOVERY BOOK
Freq: Once | Status: DC
  Filled 2017-12-07: qty 1

## 2017-12-07 MED ORDER — CHLORHEXIDINE GLUCONATE 0.12 % MT SOLN
15.0000 mL | Freq: Two times a day (BID) | OROMUCOSAL | Status: DC
  Administered 2017-12-07 – 2017-12-08 (×2): 15 mL via OROMUCOSAL
  Filled 2017-12-07: qty 15

## 2017-12-07 MED ORDER — ACETAMINOPHEN 650 MG RE SUPP: 650.0000 mg | RECTAL | Status: DC | PRN

## 2017-12-07 MED ORDER — PANTOPRAZOLE SODIUM 40 MG IV SOLR
40.0000 mg | Freq: Every day | INTRAVENOUS | Status: DC
  Administered 2017-12-07 – 2017-12-08 (×2): 40 mg via INTRAVENOUS
  Filled 2017-12-07 (×2): qty 40

## 2017-12-07 NOTE — ED Notes (Signed)
Pt placed on bedpan

## 2017-12-07 NOTE — H&P (Addendum)
NEUROHOSPITALISTS - HISTORY AND PHYSICAL   Attending Physician: Dr. Karena Addison Aroor  Admit date: 12/07/2017    Chief Complaint: Left sided weakness  History obtained from:  Patient, Chart and Room mate  HPI   Wesley Burns is an 52 y.o. male PMH of ESRD on Dialysis, HTN, Hx of CVA without deficits, BPH and Depression that presents to the ED as a code stroke with reports of acute onset Left sided weakness. EMS reports that patient fell out of his bed at approximately 15:40. Patients room mate states that he last saw him normal at 07:00 this morning.   Room mate states that patient went back to bed at approximately 07:00 and he heard him fall out of bed at 15:40 today. On admission he has Left sided weakness, Left facial droop, decreased sensation on the Left, slightly dysarthric speech. CT Head reveals:Acute right thalamic hemorrhage and New age indeterminate left thalamic lacunar infarct  Date last known well: Date: 12/07/2017 Time last known well: Time: 07:00 tPA Given: No: hemorrhage Modified Rankin: Rankin Score=3 NIHSS: 12 Intracerebral Hemorrhage (ICH) Score Total: 1  Past Medical History: He  has a past medical history of Anemia, Anxiety, Charcot's joint, Chronic kidney disease, Diabetic foot ulcers ([place]), Elevated liver function tests, GERD (gastroesophageal reflux disease), Gout, Head injury, Hip dislocation, right ([place]), History of blood transfusion, Hypertension, Neuropathy, Osteomyelitis ([place]), Stroke ([place]) (06/2016), and Type II diabetes mellitus ([place]).  Past Surgical History: He  has a past surgical history that includes Hip Closed Reduction (01/20/2012); Total hip arthroplasty (Bilateral, 1997); Foot surgery (Left, 2011); Amputation (Right, 04/07/2013); Amputation (Right, 05/07/2013); Hip Closed Reduction (Right, 05/29/2013); Hip Closed Reduction (Right, 12/24/2013); Closed reduction hip dislocation ("several times on each side"); ORIF humerus fracture (Left,  09/04/2015); ORIF humerus fracture (Left, 09/04/2015); AV fistula placement (Left, 10/02/2016); and Insertion of dialysis catheter (N/A, 10/02/2016).  Family History: indicated that his mother is deceased. He indicated that his father is deceased.  Social History:  He  reports that he quit smoking about 17 months ago. His smoking use included cigarettes. He has a 2.70 pack-year smoking history. He has quit using smokeless tobacco. His smokeless tobacco use included snuff. He reports that he does not drink alcohol or use drugs.  Allergies:  Allergies  Allergen Reactions  . Prednisone Hives, Nausea And Vomiting and Swelling    Joint swelling  Joint swelling   Medications:  Current Facility-Administered Medications:  .   stroke: mapping our early stages of recovery book, , Does not apply, Once, Aroor, Karena Addison R, MD .  acetaminophen (TYLENOL) tablet 650 mg, 650 mg, Oral, Q4H PRN **OR** acetaminophen (TYLENOL) solution 650 mg, 650 mg, Per Tube, Q4H PRN **OR** acetaminophen (TYLENOL) suppository 650 mg, 650 mg, Rectal, Q4H PRN, Aroor, Karena Addison R, MD .  iopamidol (ISOVUE-370) 76 % injection, , , ,  .  nicardipine (CARDENE) 45m in 0.86% saline 2063mIV infusion (0.1 mg/ml), 3-15 mg/hr, Intravenous, Continuous, Aroor, SuKarena Addison, MD, Last Rate: 75 mL/hr at 12/07/17 1705, 7.5 mg/hr at 12/07/17 1705 .  pantoprazole (PROTONIX) injection 40 mg, 40 mg, Intravenous, QHS, Aroor, SuKarena Addison, MD .  senna-docusate (Senokot-S) tablet 1 tablet, 1 tablet, Oral, BID, Aroor, SuLanice SchwabMD  Current Outpatient Medications:  .  amLODipine (NORVASC) 10 MG tablet, Take 1 tablet (10 mg total) by mouth daily., Disp: 30 tablet, Rfl: 0 .  bethanechol (URECHOLINE) 10 MG tablet, Take 1 tablet (10 mg total) by mouth 3 (three) times daily., Disp: 90 tablet, Rfl: 0 .  clonazePAM (KLONOPIN) 0.5 MG tablet, Take 0.5-1 mg by mouth See admin instructions. Take 1 tablet (0.5 mg) by mouth during the day and/or 2 tablets (1 mg) at  bedtime - as needed for anxiety/sleep, Disp: , Rfl:  .  cloNIDine (CATAPRES) 0.1 MG tablet, Take 1 tablet (0.1 mg total) by mouth 2 (two) times daily., Disp: 60 tablet, Rfl: 0 .  Colchicine (MITIGARE) 0.6 MG CAPS, Take 0.6 mg by mouth 2 (two) times a week. Mondays and Fridays, Disp: , Rfl:  .  febuxostat (ULORIC) 40 MG tablet, Take 20 mg by mouth daily., Disp: , Rfl:  .  ferrous sulfate 325 (65 FE) MG tablet, Take 1 tablet (325 mg total) by mouth 3 (three) times daily with meals., Disp: 90 tablet, Rfl: 0 .  FLUoxetine (PROZAC) 10 MG capsule, Take 1 capsule (10 mg total) by mouth daily., Disp: 30 capsule, Rfl: 0 .  folic acid (FOLVITE) 470 MCG tablet, Take 400 mcg by mouth daily., Disp: , Rfl:  .  furosemide (LASIX) 80 MG tablet, Take 2 tablets (160 mg total) by mouth 3 (three) times daily., Disp: 180 tablet, Rfl: 0 .  magnesium oxide (MAG-OX) 400 (241.3 Mg) MG tablet, Take 1 tablet (400 mg total) by mouth daily., Disp: 30 tablet, Rfl: 0 .  oxyCODONE-acetaminophen (PERCOCET) 5-325 MG tablet, Take 1 tablet by mouth every 6 (six) hours as needed for severe pain., Disp: 8 tablet, Rfl: 0 .  pantoprazole (PROTONIX) 40 MG tablet, Take 1 tablet (40 mg total) by mouth daily., Disp: 30 tablet, Rfl: 0 .  sevelamer carbonate (RENVELA) 800 MG tablet, Take 1 tablet (800 mg total) by mouth 3 (three) times daily with meals., Disp: 90 tablet, Rfl: 0 .  silver sulfADIAZINE (SILVADENE) 1 % cream, Apply 1 application daily topically., Disp: 50 g, Rfl: 0 .  sodium bicarbonate 650 MG tablet, Take 1 tablet (650 mg total) by mouth 3 (three) times daily., Disp: 90 tablet, Rfl: 0 .  tamsulosin (FLOMAX) 0.4 MG CAPS capsule, Take 1 capsule (0.4 mg total) by mouth daily after supper., Disp: 30 capsule, Rfl: 0 .  thiamine (VITAMIN B-1) 100 MG tablet, Take 100 mg by mouth daily., Disp: , Rfl:   ROS: History obtained from the patient General ROS: negative for - chills, fatigue, fever, night sweats, weight gain or weight  loss Psychological ROS: negative for - behavioral disorder, hallucinations, memory difficulties, mood swings or suicidal ideation Ophthalmic ROS: negative for - blurry vision, double vision, eye pain or loss of vision ENT ROS: negative for - epistaxis, nasal discharge, oral lesions, sore throat, tinnitus or vertigo Allergy and Immunology ROS: negative for - hives or itchy/watery eyes Hematological and Lymphatic ROS: negative for - bleeding problems, bruising or swollen lymph nodes Endocrine ROS: negative for - galactorrhea, hair pattern changes, polydipsia/polyuria or temperature intolerance Respiratory ROS: negative for - cough, hemoptysis, shortness of breath or wheezing Cardiovascular ROS: negative for - chest pain, dyspnea on exertion, edema or irregular heartbeat Gastrointestinal ROS: Positive for - nausea/vomiting Genito-Urinary ROS: negative for - dysuria, hematuria, incontinence or urinary frequency/urgency Musculoskeletal ROS: negative for - joint swelling or muscular weakness Neurological ROS: as noted in HPI Dermatological ROS: negative for rash and skin lesion changes  Physical Examination: Vitals:   12/07/17 1700 12/07/17 1705 12/07/17 1710  BP: (!) 150/80 138/78 (!) 119/56  Pulse: 69 68 71  Resp: 20 15 14   SpO2: 94% 96% 93%   HEENT-  Normocephalic, Normal external eye/conjunctiva.  Normal external ears. Normal external  nose, mucus membranes and septum.   Cardiovascular - Regular rate and rhythm  Respiratory - Lungs clear bilaterally. Non-labored breathing, No wheezing. Abdomen - soft and non-tender, BS normal Extremities- no edema or cyanosis Skin-warm and dry  Neurological Examination Mental Status: Awake,oriented x 3  Speech is slightly dysarthric. Able to follow 3 step commands without difficulty. Cranial Nerves: II: Visual Fields are full. Pupils are equal, round, and reactive to light.   III,IV, VI: EOMI without ptosis or diploplia. Rotational and horizontal  nystagmus.   V: Facial sensation is decreased on the Left VII: Facial movement is decreased on the Left VIII: hearing is intact to voice X: Uvula elevates symmetrically XI: Shoulder shrug is decreased on the left XII: tongue is midline without atrophy or fasciculations.  Motor: Tone is normal. Bulk is normal. 5/5 strength on the Right.  Strength is 0/5 on the Left.   Sensor: Sensation is decreased on the Left in the arms and legs. Normal on the Right Deep Tendon Reflexes: 1+ and symmetric throughout in the biceps and patellae Plantars: Toes are downgoing bilaterally.  Cerebellar: unable to perform finger-to-nose on Left Gait: Truncal ataxia to Left   Lab Results: CBC: Recent Labs  Lab 12/07/17 1619 12/07/17 1632  WBC 6.9  --   HGB 10.4* 11.2*  HCT 31.9* 33.0*  MCV 86.7  --   PLT 394  --    Basic Metabolic Panel: Recent Labs  Lab 12/07/17 1619 12/07/17 1632  NA 127* 128*  K 3.4* 3.6  CL 91* 93*  CO2 22  --   GLUCOSE 97 102*  BUN 24* 26*  CREATININE 9.59* 10.30*  CALCIUM 8.2*  --    Liver Function Tests: Recent Labs  Lab 12/07/17 1619  AST 18  ALT 17  ALKPHOS 345*  BILITOT 1.2  PROT 6.7  ALBUMIN 3.4*   Coagulation Studies: Recent Labs    12/07/17 1619  APTT 31  INR 0.93   Imaging: Ct Head Code Stroke Wo Contrast Result Date: 12/07/2017 IMPRESSION: 1. Acute right thalamic hemorrhage. Mild edema and local mass effect. 2. 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 3. New age indeterminate left thalamic lacunar infarct. Critical Value/emergent results were called by telephone at the time of interpretation on 12/07/2017 at 4:45 pm to Dr. Lorraine Lax, who verbally acknowledged these results. Electronically Signed   By: Logan Bores M.D.   On: 12/07/2017 16:50     IMPRESSION: Wesley Burns is a 52 y.o. male with PMH of ESRD on Dialysis, HTN, Hx of CVA without deficits, BPH and Depression that presents to the ED as a  code stroke with reports of acute onset Left sided weakness. EMS reports that patient fell out of his bed at approximately 15:40. Patients room mate states that he last saw him normal at 07:00 this morning.   Room mate states that patient went back to bed at approximately 07:00 and he heard him fall out of bed at 15:40 today. On admission he has Left sided weakness, Left facial droop, decreased sensation on the Left, slightly dysarthric speech. CT Head reveals:  Acute right thalamic hemorrhage New age indeterminate left thalamic lacunar infarct  Suspected Etiology: HTN hemorrhage and small vessel disease Resultant Symptoms: Left sided weakness, Left facial droop, decreased sensation on Left Stroke Risk Factors: hypertension, ESRD on Dialysis Other Stroke Risk Factors: Advanced age, ?ETOH use, Hx stroke  Outstanding Stroke Work-up Studies:     CTA of Head  Repeat CT Head in 24 hrs Echocardiogram:                                                     B/L Carotid U/S:                                                       PLAN  12/07/2017: Will admit to ICU Frequent Neurochecks  Telemetry Monitoring NPO until passes Stroke swallow screen HOLD Aspirin as patient has a right ICH For worsening mentation, obtain STAT Head CT- and contact Neurology immediately HgbA1C and Lipid Profile PT/OT/SLP Consult PM & Rehab Ongoing aggressive stroke risk factor management Patient will be counseled to be compliant with his antithrombotic medications Patient will be counseled on Lifestyle modifications including, Diet, Exercise, and Stress Follow up with [facility] in 6 weeks  HX OF STROKES: 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 May slowly restart home B/P medications after 48 hours Home Meds: Norvasc, Clonidine  HYPERLIPIDEMIA:   Lipid panel pending Home Meds:  NONE LDL  goal < 70 Will started on  Statin based on LDL results Continue statin at discharge  DIABETES:    HgA1C pending HgbA1c goal < 7.0 Currently on:  pending Continue CBG monitoring and SSI to maintain glucose 140-180 mg/dl DM education   Other Active Problems: Active Problems:   ICH (intracerebral hemorrhage) [facility])    Hospital day # 0 VTE prophylaxis: SCD's  Diet - Diet NPO time specified   FAMILY UPDATES: No family at bedside  TEAM UPDATES:Aroor, Lanice Schwab, MD   Prior Home Stroke Medications:  No antithrombotic  Discharge Stroke Meds:  Please discharge patient on TBD   Disposition: 01-Home or Self Care Therapy Recs:               PENDING Home Equipment:         PENDING Follow Up:  Koirala, Dibas, MD -PCP Follow up in 1-2 weeks      Assessment and plan discussed with with attending physician and they are in agreement.    Mary Sella, ANP-C [facility] 12/07/2017, 5:17 PM  Please page stroke NP  Or  PA  Or MD from 8am -4 pm  as this patient from this time will be  followed by the stroke.   You can look them up on [email or web address]  Password [email or web address]  NEUROHOSPITALIST ADDENDUM Seen and examined the patient today. Formulated plan as documented above by ARNP.   Will repeat CT head at midnight to look for stability. Need nephro consult for dialysis tomorrow.     This patient is neurologically critically ill due to [place].   He is at risk for significant risk of neurological worsening from cerebral edema,  death from brain herniation, heart failure,infection, respiratory failure and seizure. This patient's care  requires constant monitoring of vital signs, hemodynamics, respiratory  and cardiac monitoring, review of multiple databases, neurological assessment, discussion with family, other specialists and medical decision making of high complexity.  I spent about 50  minutes of neurocritical time in the care of this patient.     Karena Addison Aroor MD [facility] [phone]  If 7pm to 7am, please call on call as listed on AMION.
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
## 2017-12-07 NOTE — ED Notes (Signed)
Pt CBG was 96, notified Crystal(RN)

## 2017-12-07 NOTE — ED Provider Notes (Signed)
Medical screening examination/treatment/procedure(s) were conducted as a shared visit with non-physician practitioner(s) and myself.  I personally evaluated the patient during the encounter. Briefly, the patient is a 52 y.o. male with an extensive past medical history who presents to the emergency department with sudden onset of left-sided weakness who came in as a code stroke.  Workup revealed hemorrhagic stroke to right thalamus.  Starting on nicardipine for blood pressure control.  Admitted for further workup and management.   EKG Interpretation  Date/Time:  Sunday December 07 2017 16:53:14 EST Ventricular Rate:  68 PR Interval:    QRS Duration: 99 QT Interval:  481 QTC Calculation: 512 R Axis:   60 Text Interpretation:  Sinus or ectopic atrial rhythm Borderline short PR interval Prolonged QT interval No significant change since last tracing Confirmed by Drema Pry [phone]) on 12/07/2017 5:25:02 PM           Cantrell Larouche, Amadeo Garnet, MD 12/07/17 1726

## 2017-12-07 NOTE — ED Triage Notes (Signed)
Pt arrives via ems from home as a code stroke. LKW 1540 today. Pt fell out of bed and after displayed slurred speech, L sided facial droop, L sided weakness. A&OX4 upon arrival. Pt with 1 episode of emesis in CT. 18G RAC. 177/86 BP, 98%RA, 62 HR.

## 2017-12-08 ENCOUNTER — Inpatient Hospital Stay (HOSPITAL_COMMUNITY): Payer: Self-pay

## 2017-12-08 ENCOUNTER — Ambulatory Visit: Payer: 59 | Admitting: Podiatry

## 2017-12-08 LAB — GLUCOSE, CAPILLARY
GLUCOSE-CAPILLARY: 108 mg/dL — AB (ref 65–99)
GLUCOSE-CAPILLARY: 112 mg/dL — AB (ref 65–99)
Glucose-Capillary: 145 mg/dL — ABNORMAL HIGH (ref 65–99)
Glucose-Capillary: 129 mg/dL — ABNORMAL HIGH (ref 65–99)

## 2017-12-08 LAB — HEMOGLOBIN A1C
HEMOGLOBIN A1C: 4 % — AB (ref 4.8–5.6)
Mean Plasma Glucose: 68.1 mg/dL

## 2017-12-08 LAB — HIV ANTIBODY (ROUTINE TESTING W REFLEX): HIV SCREEN 4TH GENERATION: NONREACTIVE

## 2017-12-08 MED ORDER — INSULIN ASPART 100 UNIT/ML ~~LOC~~ SOLN
0.0000 [IU] | Freq: Three times a day (TID) | SUBCUTANEOUS | Status: DC
  Administered 2017-12-08 – 2017-12-15 (×6): 1 [IU] via SUBCUTANEOUS
  Administered 2017-12-17: 2 [IU] via SUBCUTANEOUS
  Administered 2017-12-19 – 2017-12-23 (×4): 1 [IU] via SUBCUTANEOUS
  Administered 2017-12-24 – 2017-12-31 (×2): 2 [IU] via SUBCUTANEOUS

## 2017-12-08 MED ORDER — TIZANIDINE HCL 4 MG PO TABS
2.0000 mg | ORAL_TABLET | Freq: Once | ORAL | Status: AC
  Administered 2017-12-08: 2 mg via ORAL
  Filled 2017-12-08: qty 1

## 2017-12-08 MED ORDER — SEVELAMER CARBONATE 800 MG PO TABS
800.0000 mg | ORAL_TABLET | Freq: Three times a day (TID) | ORAL | Status: DC
  Administered 2017-12-08 – 2017-12-31 (×54): 800 mg via ORAL
  Filled 2017-12-08 (×58): qty 1

## 2017-12-08 MED ORDER — AMLODIPINE BESYLATE 10 MG PO TABS
10.0000 mg | ORAL_TABLET | Freq: Every day | ORAL | Status: DC
  Administered 2017-12-08 – 2017-12-14 (×7): 10 mg via ORAL
  Filled 2017-12-08 (×7): qty 1

## 2017-12-08 MED ORDER — LORAZEPAM 2 MG/ML IJ SOLN
1.0000 mg | Freq: Once | INTRAMUSCULAR | Status: AC
  Administered 2017-12-08: 1 mg via INTRAVENOUS
  Filled 2017-12-08: qty 1

## 2017-12-08 MED ORDER — RESOURCE THICKENUP CLEAR PO POWD
ORAL | Status: DC | PRN
  Filled 2017-12-08: qty 125

## 2017-12-08 MED ORDER — LABETALOL HCL 5 MG/ML IV SOLN
5.0000 mg | INTRAVENOUS | Status: DC | PRN
  Administered 2017-12-08 – 2018-01-05 (×9): 5 mg via INTRAVENOUS
  Filled 2017-12-08 (×11): qty 4

## 2017-12-08 NOTE — Progress Notes (Signed)
Occupational Therapy Evaluation  Pt present to OT with the below listed diagnosis and deficits.  He demonstrates Lt hemiparesis, Lt inattention, impaired spatial awareness, decreased balance, impaired cognition, decreased activity tolerance.  He currently requires max - total A for all ADLs.  He required total A +2 to attempt to stand, but was unsuccessful.  He lives with his partner.  He reports he was mod I with ADLs PTA.   Feel he would benefit from CIR to reduce burden of care.  Will follow acutely.    12/08/17 1400  OT Visit Information  Last OT Received On 12/08/17  Assistance Needed +2  PT/OT/SLP Co-Evaluation/Treatment Yes  Reason for Co-Treatment Complexity of the patient's impairments (multi-system involvement);For patient/therapist safety;Necessary to address cognition/behavior during functional activity;To address functional/ADL transfers  OT goals addressed during session ADL's and self-care;Strengthening/ROM  History of Present Illness 52 y.o. male with ESRD, Type 2 DM, HTN, GERD, and Hx of Right basal ganglia CVA (2017), right transmet amputation. Admitted with right thalamic hemorrhagic stroke.  Precautions  Precautions Fall  Precaution Comments left foot ulcer  Home Living  Family/patient expects to be discharged to: Private residence  Living Arrangements Spouse/significant other  Available Help at Discharge Family;Available 24 hours/day  Type of Home House  Home Access Stairs to enter  Entrance Stairs-Number of Steps 2  Entrance Stairs-Rails None  Home Layout Two level;Able to live on main level with bedroom/bathroom  Alternate Level Stairs-Number of Steps full flight  Alternate Level Stairs-Rails Can reach both  [employer] Walk-in shower;Tub/shower unit  [employer] Handicapped height  Bathroom Accessibility Yes  [place] - 2 wheels;Cane - single point;Crutches;Shower seat;Grab bars - tub/shower;Grab bars - toilet  Additional Comments partner  not currently working  Prior Function  Level of Independence Independent with assistive device(s)  Comments walks with cane, drives a little.  Goes to HD 3 days/week   Communication  Communication Expressive difficulties (dysarthria )  Pain Assessment  Pain Assessment No/denies pain  Cognition  Arousal/Alertness Awake/alert  Behavior During Therapy Flat affect  Overall Cognitive Status Impaired/Different from baseline  Area of Impairment Attention;Memory;Following commands;Safety/judgement;Problem solving;Awareness  Current Attention Level Sustained  Following Commands Follows one step commands consistently;Follows one step commands inconsistently  Safety/Judgement Decreased awareness of safety;Decreased awareness of deficits  Awareness Intellectual  Problem Solving Slow processing;Decreased initiation;Requires verbal cues;Difficulty sequencing;Requires tactile cues  General Comments Pt very slow to process info.     Upper Extremity Assessment  Upper Extremity Assessment LUE deficits/detail  LUE Deficits / Details Pt does not move Lt UE spontaneously, but increased spasticity/vs active resistance noted with PROM.  Pt with h/o rotator cuff repair.    Lower Extremity Assessment  Lower Extremity Assessment Defer to PT evaluation  Cervical / Trunk Assessment  Cervical / Trunk Assessment Kyphotic;Other exceptions (Decreased strength Lt side )  ADL  Overall ADL's  Needs assistance/impaired  Eating/Feeding NPO  Grooming Wash/dry hands;Wash/dry face;Moderate assistance;Bed level  Upper Body Bathing Maximal assistance;Bed level  Lower Body Bathing Total assistance;Bed level  Upper Body Dressing  Total assistance;Bed level  Lower Body Dressing Total assistance;Bed level  Toilet Transfer Total assistance  Toilet Transfer Details (indicate cue type and reason) unable   Toileting- Clothing Manipulation and Hygiene Total assistance;Bed level  Functional mobility during ADLs Total  assistance;+2 for physical assistance  General ADL Comments Pt incontinent of stool.  Assisted with clean up while supine.  Attempted to transfer to [facility], but pt pushing heavily to the Lt, and assisted very  little with sit to stand.  Attempt to pivot was met with resistance due to pushing   Vision- History  Baseline Vision/History Wears glasses  Wears Glasses At all times  Vision- Assessment  Additional Comments Will need further assessment.   Pt appears to have Lt inattention.    Perception  Perception Tested? Yes  Perception Deficits Inattention/neglect  Inattention/Neglect Does not attend to left visual field;Does not attend to left side of body  Praxis  Praxis tested? Deficits  Deficits Ideomotor;Organization  Praxis-Other Comments needs further assessment   Bed Mobility  Overal bed mobility Needs Assistance  Bed Mobility Rolling;Sidelying to Sit;Sit to Supine  Rolling Mod assist;Max assist  Sidelying to sit Max assist  Sit to supine Total assist;+2 for physical assistance  General bed mobility comments cues for sequence with hand over hand assist to reach for rail to roll left, max assist to roll right, assist to elevate trunk with rail and bring legs to EOB. Return to bed total assist due to incontinent stool and need for pt to not assist  Transfers  Overall transfer level Needs assistance  Transfers Sit to/from Stand  Sit to Stand +2 physical assistance;Total assist  General transfer comment attempted sit to stand from EOB with left knee blocked with pt not initiating and total assist to lift pt from EOB with very slow processing to initiate RLE after return to sit started  Balance  Overall balance assessment Needs assistance  Sitting-balance support Single extremity supported;Feet supported  Sitting balance-Leahy Scale Zero  Sitting balance - Comments pt pushing left with RUE and needed assist to move RUE away from surface as well as truncal support to maintain sitting max  assist  Standing balance-Leahy Scale Zero  General Comments  General comments (skin integrity, edema, etc.) VSS   OT - End of Session  Equipment Utilized During Treatment Gait belt  Activity Tolerance Patient tolerated treatment well  Patient left in bed;with call bell/phone within reach  Nurse Communication Mobility status;Need for lift equipment  OT Assessment  OT Recommendation/Assessment Patient needs continued OT Services  OT Visit Diagnosis Unsteadiness on feet (R26.81);Muscle weakness (generalized) (M62.81);Hemiplegia and hemiparesis  Hemiplegia - Right/Left Left  Hemiplegia - dominant/non-dominant Non-Dominant  Hemiplegia - caused by Cerebral infarction  OT Problem List Decreased strength;Decreased range of motion;Decreased activity tolerance;Impaired balance (sitting and/or standing);Impaired vision/perception;Decreased coordination;Decreased cognition;Decreased safety awareness;Decreased knowledge of use of DME or AE;Impaired sensation;Impaired tone;Impaired UE functional use  Barriers to Discharge Decreased caregiver support  Barriers to Discharge Comments unsure level of care partner is able to provide   OT Plan  OT Frequency (ACUTE ONLY) Min 2X/week  OT Treatment/Interventions (ACUTE ONLY) Self-care/ADL training;Neuromuscular education;DME and/or AE instruction;Therapeutic activities;Cognitive remediation/compensation;Visual/perceptual remediation/compensation;Balance training;Patient/family education  AM-PAC OT "6 Clicks" Daily Activity Outcome Measure  Help from another person eating meals? 1  Help from another person taking care of personal grooming? 2  Help from another person toileting, which includes using toliet, bedpan, or urinal? 1  Help from another person bathing (including washing, rinsing, drying)? 1  Help from another person to put on and taking off regular upper body clothing? 1  Help from another person to put on and taking off regular lower body clothing? 1   6 Click Score 7  ADL G Code Conversion CM  OT Recommendation  Follow Up Recommendations CIR;Supervision/Assistance - 24 hour  OT Equipment None recommended by OT  Individuals Consulted  Consulted and Agree with Results and Recommendations Patient  Acute Rehab OT Goals  Patient  Stated Goal return home, read  OT Goal Formulation With patient  Time For Goal Achievement 12/22/17  Potential to Achieve Goals Good  OT Time Calculation  OT Start Time (ACUTE ONLY) 1204  OT Stop Time (ACUTE ONLY) 1247  OT Time Calculation (min) 43 min  OT General Charges  $OT Visit 1 Visit  OT Evaluation  $OT Eval Moderate Complexity 1 Mod  Written Expression  Dominant Hand Right  Lucille Passy, OTR/L [phone]

## 2017-12-08 NOTE — Progress Notes (Signed)
NEUROHOSPITALISTS - STROKE TEAM DAILY PROGRESS NOTE  ADMISSION HISTORY Wesley Burns is an 52 y.o. male PMH of ESRD on Dialysis, HTN, Hx of CVA without deficits, BPH and Depression that presents to the ED as a code stroke with reports of acute onset Left sided weakness. EMS reports that patient fell out of his bed at approximately 15:40. Patients room mate states that he last saw him normal at 07:00 this morning.   Room mate states that patient went back to bed at approximately 07:00 and he heard him fall out of bed at 15:40 today. On admission he has Left sided weakness, Left facial droop, decreased sensation on the Left, slightly dysarthric speech. CT Head reveals:Acute right thalamic hemorrhage and New age indeterminate left thalamic lacunar infarct  Date last known well: Date: 12/07/2017 Time last known well: Time: 07:00 tPA Given: No: hemorrhage Modified Rankin: Rankin Score=3 NIHSS: 12 Intracerebral Hemorrhage (ICH) Score Total: 1    SUBJECTIVE No family at bedside.  Patient found lying in bed in no acute distress.  Patient voices no new complaints.  No new/acute events reported overnight.  Physical Examination: Vitals:   12/08/17 1200 12/08/17 1300 12/08/17 1400 12/08/17 1500  BP: 130/65 139/75 128/71 (!) 144/71  Pulse: 70 77 79 90  Resp: 14 14 18 20   Temp: 97.8 F (36.6 C)     TempSrc: Oral     SpO2: 97% (!) 88% 100% 96%  Weight:      Height:       HEENT-  Normocephalic, Normal external eye/conjunctiva.  Normal external ears. Normal external nose, mucus membranes and septum.   Cardiovascular - Regular rate and rhythm  Respiratory - Lungs clear bilaterally. Non-labored breathing, No wheezing. Abdomen - soft and non-tender, BS normal Extremities- no edema or cyanosis Skin-warm and dry  Neurological Examination Mental Status: Awake,oriented x 3  Speech is slightly dysarthric. Able to follow 3 step commands without difficulty. Cranial Nerves: II:  Left field cut. Pupils are equal, round, and reactive to light.   III,IV, VI: EOMI without ptosis or diploplia. No nystagmus.   V: Facial sensation is decreased on the Left VII: Facial movement is decreased on the Left VIII: hearing is intact to voice X: Uvula elevates symmetrically XI: Shoulder shrug is decreased on the left XII: tongue is midline without atrophy or fasciculations.  Motor: Tone is normal. Bulk is normal. 5/5 strength on the Right.  Dense left hemiplegia and Strength is 0/5 on the Left.   Sensor: Sensation is decreased on the Left in the arms and legs. Normal on the Right Deep Tendon Reflexes: 1+ and symmetric throughout in the biceps and patellae Plantars: Toes are downgoing bilaterally.  Cerebellar: unable to perform finger-to-nose on Left Gait: Truncal ataxia to Left   Lab Results: CBC: Recent Labs  Lab 12/07/17 1619 12/07/17 1632  WBC 6.9  --   HGB 10.4* 11.2*  HCT 31.9* 33.0*  MCV 86.7  --   PLT 394  --    Basic Metabolic Panel: Recent Labs  Lab 12/07/17 1619 12/07/17 1632  NA 127* 128*  K 3.4* 3.6  CL 91* 93*  CO2 22  --   GLUCOSE 97 102*  BUN 24* 26*  CREATININE 9.59* 10.30*  CALCIUM 8.2*  --    Liver Function Tests: Recent Labs  Lab 12/07/17 1619  AST 18  ALT 17  ALKPHOS 345*  BILITOT 1.2  PROT 6.7  ALBUMIN 3.4*   Coagulation Studies: Recent Labs    12/07/17  1619  APTT 31  INR 0.93   Imaging: Ct Head Code Stroke Wo Contrast Result Date: 12/07/2017 IMPRESSION: 1. Acute right thalamic hemorrhage. Mild edema and local mass effect. 2. 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 3. New age indeterminate left thalamic lacunar infarct. Critical Value/emergent results were called by telephone at the time of interpretation on 12/07/2017 at 4:45 pm to Dr. Lorraine Lax, who verbally acknowledged these results. Electronically Signed   By: Logan Bores M.D.   On: 12/07/2017 16:50   Ct Angio Head W Or Wo  Contrast Result Date: 12/07/2017 IMPRESSION: 1. No aneurysm, vascular malformation, or spot sign associated with the right thalamic hemorrhage. 2. Patent circle of Willis with intracranial atherosclerosis as above. Moderate to severe right PCA stenoses.   Ct Head Wo Contrast Result Date: 12/08/2017 IMPRESSION: 1. Stable right thalamic hematoma and small volume of hemorrhage in right lateral ventricle. 2. Mild increase of mass effect with 5 mm right-to-left midline shift, previously 4 mm. 3. No new acute intracranial abnormality identified.    IMPRESSION: Wesley Burns is a 52 y.o. male with PMH of ESRD on Dialysis, HTN, Hx of CVA without deficits, BPH and Depression that presents to the ED as a code stroke with reports of acute onset Left sided weakness. EMS reports that patient fell out of his bed at approximately 15:40. Patients room mate states that he last saw him normal at 07:00 this morning.   Room mate states that patient went back to bed at approximately 07:00 and he heard him fall out of bed at 15:40 today. On admission he has Left sided weakness, Left facial droop, decreased sensation on the Left, slightly dysarthric speech. CT Head reveals:  Acute right thalamic hemorrhage New age indeterminate left thalamic lacunar infarct  Suspected Etiology: HTN hemorrhage and small vessel disease Resultant Symptoms: Left sided weakness, Left facial droop, decreased sensation on Left Stroke Risk Factors: hypertension, ESRD on Dialysis Other Stroke Risk Factors: Advanced age, ?ETOH use, Hx stroke  Outstanding Stroke Work-up Studies:    Work up completed at this time                                                      ASSESSMENT 12/08/2017: Neuro exam remained stable.  Repeat head CT stable.  Mild midline shift.  Passed SLP eval.  Home blood pressure medications restarted.  Consulted nephrology for dialysis.  Appreciate assistance.  Plan to continue in ICU today and likely transfer to floor in  a.m.  We will continue to hold aspirin for now.  PLAN  12/08/2017: Continue in ICU for today, Will likely transfer to floor in AM. Check CT and a gram in the morning. Start oral medications if able to swallow Frequent Neurochecks  Telemetry Monitoring Continue to HOLD Aspirin as patient has a right ICH For worsening mentation, obtain STAT Head CT- and contact Neurology immediately PT/OT/SLP Consult PM & Rehab Ongoing aggressive stroke risk factor management Patient will be counseled to be compliant with his antithrombotic medications Patient will be counseled on Lifestyle modifications including, Diet, Exercise, and Stress Follow up with [facility] in 6 weeks  HX OF STROKES: 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  DYSPHAGIA: Passes SLP swallow evaluation- Dysphagia 3 diet Aspiration Precautions in progress  MEDICAL ISSUES:  Nephrology  Consulted for Dialysis management ESRD on Dialysis Anemia Hyponatremia Hypocalcemia Elevated Alk Phos  HYPERTENSION: Stable SBP goal less than 140/90  Nicardipine drip discontinued, Labetolol PRN Long term BP goal normotensive. Restarted home B/P medication Norvasc 12/08/2017 Home Meds: Norvasc, Clonidine  HYPERLIPIDEMIA:   Lipid panel pending Home Meds:  NONE LDL  goal < 70 Will started on  Statin based on LDL results Continue statin at discharge  DIABETES:    HgA1C pending HgbA1c goal < 7.0 Currently on:  Novolog Continue CBG monitoring and SSI to maintain glucose 140-180 mg/dl DM education   Other Active Problems: Active Problems:   ICH (intracerebral hemorrhage) [facility])    Hospital day # 1 VTE prophylaxis: SCD's  Diet - DIET DYS 3 Room service appropriate? Yes; Fluid consistency: Nectar Thick   FAMILY UPDATES: No family at bedside  TEAM UPDATES:Layton Naves, Lucy Antigua, MD   Prior Home Stroke Medications:  No antithrombotic  Discharge Stroke Meds:  Please discharge patient on TBD   Disposition: 01-Home or Self  Care Therapy Recs:               CIR Home Equipment:         TBD Follow Up:  Follow-up Information    Garvin Fila, MD. Schedule an appointment as soon as possible for a visit in 6 week(s).   Specialties:  Neurology, Radiology Contact information: [address] [place] [address] [place], Maceo, MD -PCP Follow up in 1-2 weeks      Assessment and plan discussed with with attending physician and they are in agreement.    Mary Sella, ANP-C [facility] 12/08/2017, 3:57 PM I have personally examined this patient, reviewed notes, independently viewed imaging studies, participated in medical decision making and plan of care.ROS completed by me personally and pertinent positives fully documented  I have made any additions or clarifications directly to the above note. Agree with note above.This patient is critically ill and at significant risk of neurological worsening, death and care requires constant monitoring of vital signs, hemodynamics,respiratory and cardiac monitoring, extensive review of multiple databases, frequent neurological assessment, discussion with family, other specialists and medical decision making of high complexity.I have made any additions or clarifications directly to the above note.This critical care time does not reflect procedure time, or teaching time or supervisory time of PA/NP/Med Resident etc but could involve care discussion time.  I spent 30 minutes of neurocritical care time  in the care of  this patient.      Antony Contras, MD Medical Director [facility] Pager: [phone] 12/08/2017 5:28 PM  Please page stroke NP  Or  PA  Or MD from 8am -4 pm  as this patient from this time will be  followed by the stroke.   You can look them up on [email or web address]  Password [email or web address]

## 2017-12-08 NOTE — Progress Notes (Signed)
Rehab Admissions Coordinator Note:  Patient was screened by Trish Mage for appropriateness for an Inpatient Acute Rehab Consult. Will await formal PT/OT evaluations to determine most appropriate venue of care for rehab.  Call me for questions.  Trish Mage 12/08/2017, 1:42 PM  I can be reached at [phone].

## 2017-12-08 NOTE — Evaluation (Signed)
Physical Therapy Evaluation Patient Details Name: Wesley Burns MRN: [id number] DOB: 09-Jan-1966 Today's Date: 12/08/2017   History of Present Illness  52 y.o. male with ESRD, Type 2 DM, HTN, GERD, and Hx of Right basal ganglia CVA (2017), right transmet amputation. Admitted with right thalamic hemorrhagic stroke.  Clinical Impression  Pt soft spoken with delayed processing and left hemiplegia. Pt with decreased cognition, balance, strength, transfers, function,and sensation who was previously mod I with cane and assist from significant other. Pt will benefit from acute therapy to maximize mobility, function, balance, and cognition to decrease burden of care.     Follow Up Recommendations CIR;Supervision/Assistance - 24 hour    Equipment Recommendations  Wheelchair cushion (measurements PT);Wheelchair (measurements PT);Hospital bed    Recommendations for Other Services Rehab consult     Precautions / Restrictions Precautions Precautions: Fall Precaution Comments: left foot ulcer      Mobility  Bed Mobility Overal bed mobility: Needs Assistance Bed Mobility: Rolling;Sidelying to Sit;Sit to Supine Rolling: Mod assist;Max assist Sidelying to sit: Max assist   Sit to supine: Total assist;+2 for physical assistance   General bed mobility comments: cues for sequence with hand over hand assist to reach for rail to roll left, max assist to roll right, assist to elevate trunk with rail and bring legs to EOB. Return to bed total assist due to incontinent stool and need for pt to not assist  Transfers Overall transfer level: Needs assistance   Transfers: Sit to/from Stand Sit to Stand: +2 physical assistance;Total assist         General transfer comment: attempted sit to stand from EOB with left knee blocked with pt not initiating and total assist to lift pt from EOB with very slow processing to initiate RLE after return to sit started  Ambulation/Gait             General  Gait Details: unsafe to attempt  Stairs            Wheelchair Mobility    Modified Rankin (Stroke Patients Only) Modified Rankin (Stroke Patients Only) Pre-Morbid Rankin Score: Slight disability Modified Rankin: Severe disability     Balance Overall balance assessment: Needs assistance Sitting-balance support: Single extremity supported;Feet supported Sitting balance-Leahy Scale: Zero Sitting balance - Comments: pt pushing left with RUE and needed assist to move RUE away from surface as well as truncal support to maintain sitting max assist     Standing balance-Leahy Scale: Zero                               Pertinent Vitals/Pain Pain Assessment: No/denies pain    Home Living Family/patient expects to be discharged to:: Private residence Living Arrangements: Spouse/significant other   Type of Home: House Home Access: Stairs to enter   [employer] of Steps: 2 Home Layout: Two level;Able to live on main level with bedroom/bathroom Home Equipment: Dan Humphreys - 2 wheels;Cane - single point;Crutches;Shower seat;Grab bars - tub/shower;Grab bars - toilet Additional Comments: partner not currently working    Prior Function Level of Independence: Independent with assistive device(s)         Comments: walks with cane, drives a little     Hand Dominance   Dominant Hand: Right    Extremity/Trunk Assessment   Upper Extremity Assessment Upper Extremity Assessment: Defer to OT evaluation    Lower Extremity Assessment Lower Extremity Assessment: LLE deficits/detail LLE Deficits / Details: no AROM noted in  LLE with functional movement LLE Sensation: decreased light touch    Cervical / Trunk Assessment Cervical / Trunk Assessment: Kyphotic(pushing left)  Communication   Communication: Expressive difficulties  Cognition Arousal/Alertness: Awake/alert Behavior During Therapy: Flat affect Overall Cognitive Status: Impaired/Different from  baseline Area of Impairment: Memory;Following commands;Safety/judgement;Problem solving                       Following Commands: Follows one step commands inconsistently;Follows one step commands with increased time Safety/Judgement: Decreased awareness of deficits;Decreased awareness of safety   Problem Solving: Slow processing;Decreased initiation;Difficulty sequencing;Requires verbal cues;Requires tactile cues General Comments: pushing left, incontinent of stool and unaware      General Comments      Exercises     Assessment/Plan    PT Assessment Patient needs continued PT services  PT Problem List Decreased strength;Decreased mobility;Decreased safety awareness;Impaired tone;Decreased range of motion;Decreased coordination;Decreased activity tolerance;Decreased cognition;Decreased balance;Decreased knowledge of use of DME;Impaired sensation       PT Treatment Interventions Gait training;Therapeutic exercise;Patient/family education;Balance training;Functional mobility training;Neuromuscular re-education;DME instruction;Therapeutic activities;Cognitive remediation    PT Goals (Current goals can be found in the Care Plan section)  Acute Rehab PT Goals Patient Stated Goal: return home, read PT Goal Formulation: With patient Time For Goal Achievement: 12/22/17 Potential to Achieve Goals: Good    Frequency Min 3X/week   Barriers to discharge Decreased caregiver support      Co-evaluation PT/OT/SLP Co-Evaluation/Treatment: Yes Reason for Co-Treatment: Complexity of the patient's impairments (multi-system involvement);For patient/therapist safety PT goals addressed during session: Mobility/safety with mobility;Balance         AM-PAC PT "6 Clicks" Daily Activity  Outcome Measure Difficulty turning over in bed (including adjusting bedclothes, sheets and blankets)?: Unable Difficulty moving from lying on back to sitting on the side of the bed? :  Unable Difficulty sitting down on and standing up from a chair with arms (e.g., wheelchair, bedside commode, etc,.)?: Unable Help needed moving to and from a bed to chair (including a wheelchair)?: Total Help needed walking in hospital room?: Total Help needed climbing 3-5 steps with a railing? : Total 6 Click Score: 6    End of Session   Activity Tolerance: Patient tolerated treatment well Patient left: in bed;with call bell/phone within reach;with bed alarm set Nurse Communication: Mobility status;Need for lift equipment PT Visit Diagnosis: Unsteadiness on feet (R26.81);Other abnormalities of gait and mobility (R26.89);Hemiplegia and hemiparesis Hemiplegia - Right/Left: Left Hemiplegia - dominant/non-dominant: Non-dominant Hemiplegia - caused by: Other Nontraumatic intracranial hemorrhage    Time: 1761-6073 PT Time Calculation (min) (ACUTE ONLY): 41 min   Charges:   PT Evaluation $PT Eval Moderate Complexity: 1 Mod     PT G Codes:        [name], PT [phone]   Takeya Marquis B Faythe Heitzenrater 12/08/2017, 1:58 PM

## 2017-12-08 NOTE — Progress Notes (Signed)
Repeat CT head completed: IMPRESSION: 1. Stable right thalamic hematoma and small volume of hemorrhage in right lateral ventricle. 2. Mild increase of mass effect with 5 mm right-to-left midline shift, previously 4 mm. 3. No new acute intracranial abnormality identified.  A/R: 52 year old male with acute right thalamic hemorrhage and new age-indeterminate left thalamic infarct 1. Edema from mass effect not sufficient to warrant hypertonic saline.  2. Continue to monitor.   Electronically signed: Dr. Caryl Pina
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
## 2017-12-08 NOTE — Progress Notes (Signed)
OT Cancellation Note  Patient Details Name: Wesley Burns MRN: [id number] DOB: 1966-04-27   Cancelled Treatment:    Reason Eval/Treat Not Completed: Patient not medically ready. Pt with active bedrest orders. Will await increase in activity orders prior to initiating OT evaluation. Thank you for this referral!  Doristine Section, MS OTR/L  Pager: [phone]   [facility] [name] 12/08/2017, 6:47 AM

## 2017-12-08 NOTE — Progress Notes (Signed)
PT Cancellation Note  Patient Details Name: Wesley Burns MRN: [id number] DOB: 03-Oct-1966   Cancelled Treatment:    Reason Eval/Treat Not Completed: Medical issues which prohibited therapy(pt currently on bedrest and await increased activity order)   [name] B [name] 12/08/2017, 7:08 AM  [name], PT [phone]
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
## 2017-12-08 NOTE — Consult Note (Signed)
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  Chronic,scabbed.  Right transmetatarsal amputation, healed incision line.  Wound type:neuropathic Pressure Injury POA: Yes Measurement:1 cm x 0.5 cm scabbed dry lesion Wound bed: scab Drainage (amount, consistency, odor) scant serosanguinous  no odor Periwound:calloused Dressing procedure/placement/frequency:Cleanse wound left plantar foot with NS.  Apply dry dressing daily.  Will not follow at this time.  Please re-consult if needed.  Maple Hudson RN BSN [facility] Pager [phone]

## 2017-12-09 ENCOUNTER — Encounter (HOSPITAL_COMMUNITY): Payer: Self-pay | Admitting: General Practice

## 2017-12-09 ENCOUNTER — Other Ambulatory Visit: Payer: Self-pay

## 2017-12-09 DIAGNOSIS — E1142 Type 2 diabetes mellitus with diabetic polyneuropathy: Secondary | ICD-10-CM

## 2017-12-09 DIAGNOSIS — E1129 Type 2 diabetes mellitus with other diabetic kidney complication: Secondary | ICD-10-CM | POA: Diagnosis present

## 2017-12-09 DIAGNOSIS — I10 Essential (primary) hypertension: Secondary | ICD-10-CM

## 2017-12-09 DIAGNOSIS — I610 Nontraumatic intracerebral hemorrhage in hemisphere, subcortical: Principal | ICD-10-CM

## 2017-12-09 DIAGNOSIS — I69391 Dysphagia following cerebral infarction: Secondary | ICD-10-CM

## 2017-12-09 DIAGNOSIS — I619 Nontraumatic intracerebral hemorrhage, unspecified: Secondary | ICD-10-CM

## 2017-12-09 DIAGNOSIS — Z87891 Personal history of nicotine dependence: Secondary | ICD-10-CM

## 2017-12-09 DIAGNOSIS — D62 Acute posthemorrhagic anemia: Secondary | ICD-10-CM

## 2017-12-09 DIAGNOSIS — Z87820 Personal history of traumatic brain injury: Secondary | ICD-10-CM

## 2017-12-09 DIAGNOSIS — Z89439 Acquired absence of unspecified foot: Secondary | ICD-10-CM

## 2017-12-09 DIAGNOSIS — N186 End stage renal disease: Secondary | ICD-10-CM

## 2017-12-09 DIAGNOSIS — I6930 Unspecified sequelae of cerebral infarction: Secondary | ICD-10-CM

## 2017-12-09 DIAGNOSIS — Z992 Dependence on renal dialysis: Secondary | ICD-10-CM

## 2017-12-09 DIAGNOSIS — D638 Anemia in other chronic diseases classified elsewhere: Secondary | ICD-10-CM

## 2017-12-09 LAB — COMPREHENSIVE METABOLIC PANEL
ALK PHOS: 242 U/L — AB (ref 38–126)
ALT: 12 U/L — ABNORMAL LOW (ref 17–63)
AST: 15 U/L (ref 15–41)
Albumin: 2.8 g/dL — ABNORMAL LOW (ref 3.5–5.0)
Anion gap: 18 — ABNORMAL HIGH (ref 5–15)
BUN: 32 mg/dL — ABNORMAL HIGH (ref 6–20)
CALCIUM: 7.4 mg/dL — AB (ref 8.9–10.3)
CO2: 16 mmol/L — AB (ref 22–32)
Chloride: 95 mmol/L — ABNORMAL LOW (ref 101–111)
Creatinine, Ser: 11.01 mg/dL — ABNORMAL HIGH (ref 0.61–1.24)
GFR, EST AFRICAN AMERICAN: 5 mL/min — AB (ref >60)
GFR, EST NON AFRICAN AMERICAN: 5 mL/min — AB (ref >60)
Glucose, Bld: 122 mg/dL — ABNORMAL HIGH (ref 65–99)
Potassium: 4.4 mmol/L (ref 3.5–5.1)
SODIUM: 129 mmol/L — AB (ref 135–145)
Total Bilirubin: 0.8 mg/dL (ref 0.3–1.2)
Total Protein: 5.5 g/dL — ABNORMAL LOW (ref 6.5–8.1)

## 2017-12-09 LAB — GLUCOSE, CAPILLARY
GLUCOSE-CAPILLARY: 88 mg/dL (ref 65–99)
Glucose-Capillary: 72 mg/dL (ref 65–99)
Glucose-Capillary: 130 mg/dL — ABNORMAL HIGH (ref 65–99)

## 2017-12-09 LAB — CBC
HCT: 25.1 % — ABNORMAL LOW (ref 39.0–52.0)
Hemoglobin: 8.4 g/dL — ABNORMAL LOW (ref 13.0–17.0)
MCH: 30 pg (ref 26.0–34.0)
MCHC: 33.5 g/dL (ref 30.0–36.0)
MCV: 89.6 fL (ref 78.0–100.0)
PLATELETS: 362 10*3/uL (ref 150–400)
RBC: 2.8 MIL/uL — ABNORMAL LOW (ref 4.22–5.81)
RDW: 18.2 % — AB (ref 11.5–15.5)
WBC: 7.2 10*3/uL (ref 4.0–10.5)

## 2017-12-09 LAB — MAGNESIUM: MAGNESIUM: 1.7 mg/dL (ref 1.7–2.4)

## 2017-12-09 LAB — LIPID PANEL
CHOLESTEROL: 149 mg/dL (ref 0–200)
HDL: 65 mg/dL (ref >40)
LDL Cholesterol: 73 mg/dL (ref 0–99)
TRIGLYCERIDES: 54 mg/dL (ref <150)
Total CHOL/HDL Ratio: 2.3 RATIO
VLDL: 11 mg/dL (ref 0–40)

## 2017-12-09 LAB — PHOSPHORUS: PHOSPHORUS: 3.3 mg/dL (ref 2.5–4.6)

## 2017-12-09 MED ORDER — SODIUM CHLORIDE 0.9 % IV SOLN: 100.0000 mL | INTRAVENOUS | Status: DC | PRN

## 2017-12-09 MED ORDER — LOPERAMIDE HCL 2 MG PO CAPS
4.0000 mg | ORAL_CAPSULE | Freq: Once | ORAL | Status: AC
  Administered 2017-12-09: 4 mg via ORAL
  Filled 2017-12-09: qty 2

## 2017-12-09 MED ORDER — LIDOCAINE-PRILOCAINE 2.5-2.5 % EX CREA
1.0000 "application " | TOPICAL_CREAM | CUTANEOUS | Status: DC | PRN
  Filled 2017-12-09: qty 5

## 2017-12-09 MED ORDER — TIZANIDINE HCL 4 MG PO TABS
2.0000 mg | ORAL_TABLET | Freq: Three times a day (TID) | ORAL | Status: DC | PRN
  Administered 2017-12-09 – 2018-01-09 (×36): 2 mg via ORAL
  Filled 2017-12-09 (×38): qty 1

## 2017-12-09 MED ORDER — PANTOPRAZOLE SODIUM 40 MG PO TBEC
40.0000 mg | DELAYED_RELEASE_TABLET | Freq: Every day | ORAL | Status: DC
  Administered 2017-12-09 – 2018-01-03 (×26): 40 mg via ORAL
  Filled 2017-12-09 (×26): qty 1

## 2017-12-09 MED ORDER — PENTAFLUOROPROP-TETRAFLUOROETH EX AERO: 1.0000 "application " | INHALATION_SPRAY | CUTANEOUS | Status: DC | PRN

## 2017-12-09 MED ORDER — LIDOCAINE HCL (PF) 1 % IJ SOLN: 5.0000 mL | INTRAMUSCULAR | Status: DC | PRN

## 2017-12-09 NOTE — Progress Notes (Signed)
Pt transferred to [address]. Report given to [employer].
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
## 2017-12-09 NOTE — Progress Notes (Addendum)
NEUROHOSPITALISTS - STROKE TEAM DAILY PROGRESS NOTE  ADMISSION HISTORY Wesley Burns is an 52 y.o. male PMH of ESRD on Dialysis, HTN, Hx of CVA without deficits, BPH and Depression that presents to the ED as a code stroke with reports of acute onset Left sided weakness. EMS reports that patient fell out of his bed at approximately 15:40. Patients room mate states that he last saw him normal at 07:00 this morning.   Room mate states that patient went back to bed at approximately 07:00 and he heard him fall out of bed at 15:40 today. On admission he has Left sided weakness, Left facial droop, decreased sensation on the Left, slightly dysarthric speech. CT Head reveals:Acute right thalamic hemorrhage and New age indeterminate left thalamic lacunar infarct  Date last known well: Date: 12/07/2017 Time last known well: Time: 07:00 tPA Given: No: hemorrhage Modified Rankin: Rankin Score=3 NIHSS: 12 Intracerebral Hemorrhage (ICH) Score Total: 1    SUBJECTIVE No family at bedside.  Patient found lying in bed in no acute distress. He is getting hemodialysis. Patient voices no new complaints.  No new/acute events reported overnight. He complains of muscle spasm and did obtain relief from one dose of Zanaflex yesterday  Physical Examination: Vitals:   12/09/17 1045 12/09/17 1100 12/09/17 1109 12/09/17 1200  BP: 135/71 (!) 144/70 (!) 145/72   Pulse: 79 79 81   Resp: _0 Temp:   98.5 F (36.9 C) 98.6 F (37 C)  TempSrc:   Oral Oral  SpO2: 99% 98% 97%   Weight:   147 lb 11.3 oz (67 kg)   Height:       HEENT-  Normocephalic, Normal external eye/conjunctiva.  Normal external ears. Normal external nose, mucus membranes and septum.   Cardiovascular - Regular rate and rhythm  Respiratory - Lungs clear bilaterally. Non-labored breathing, No wheezing. Abdomen - soft and non-tender, BS normal Extremities- no edema or cyanosis Skin-warm and dry  Neurological  Examination Mental Status: Awake,oriented x 3  Speech is slightly dysarthric. Able to follow 3 step commands without difficulty. Cranial Nerves: II: Left field cut. Pupils are equal, round, and reactive to light.   III,IV, VI: EOMI without ptosis or diploplia. No nystagmus.   V: Facial sensation is decreased on the Left VII: Facial movement is decreased on the Left VIII: hearing is intact to voice X: Uvula elevates symmetrically XI: Shoulder shrug is decreased on the left XII: tongue is midline without atrophy or fasciculations.  Motor: Tone is normal. Bulk is normal. 5/5 strength on the Right.  Dense left hemiplegia and Strength is 0/5 on the Left.   Sensor: Sensation is decreased on the Left in the arms and legs. Normal on the Right Deep Tendon Reflexes: 1+ and symmetric throughout in the biceps and patellae Plantars: Toes are downgoing bilaterally.  Cerebellar: unable to perform finger-to-nose on Left Gait: Truncal ataxia to Left   Lab Results: CBC: Recent Labs  Lab 12/07/17 1619 12/07/17 1632 12/09/17 0455  WBC 6.9  --  7.2  HGB 10.4* 11.2* 8.4*  HCT 31.9* 33.0* 25.1*  MCV 86.7  --  89.6  PLT 394  --  735   Basic Metabolic Panel: Recent Labs  Lab 12/07/17 1619 12/07/17 1632 12/09/17 0455  NA 127* 128* 129*  K 3.4* 3.6 4.4  CL 91* 93* 95*  CO2 22  --  16*  GLUCOSE 97 102* 122*  BUN 24* 26* 32*  CREATININE 9.59* 10.30* 11.01*  CALCIUM 8.2*  --  7.4*  MG  --   --  1.7  PHOS  --   --  3.3   Liver Function Tests: Recent Labs  Lab 12/07/17 1619 12/09/17 0455  AST 18 15  ALT 17 12*  ALKPHOS 345* 242*  BILITOT 1.2 0.8  PROT 6.7 5.5*  ALBUMIN 3.4* 2.8*   Coagulation Studies: Recent Labs    12/07/17 1619  APTT 31  INR 0.93   Imaging: Ct Head Code Stroke Wo Contrast Result Date: 12/07/2017 IMPRESSION: 1. Acute right thalamic hemorrhage. Mild edema and local mass effect. 2. Chronic small vessel ischemia and encephalomalacia from old right basal ganglia  hemorrhage. 3. New age indeterminate left thalamic lacunar infarct. Critical Value/emergent results were called by telephone at the time of interpretation on 12/07/2017 at 4:45 pm to Dr. Lorraine Lax, who verbally acknowledged these results. Electronically Signed   By: Logan Bores M.D.   On: 12/07/2017 16:50   Ct Angio Head W Or Wo Contrast Result Date: 12/07/2017 IMPRESSION: 1. No aneurysm, vascular malformation, or spot sign associated with the right thalamic hemorrhage. 2. Patent circle of Willis with intracranial atherosclerosis as above. Moderate to severe right PCA stenoses.   Ct Head Wo Contrast Result Date: 12/08/2017 IMPRESSION: 1. Stable right thalamic hematoma and small volume of hemorrhage in right lateral ventricle. 2. Mild increase of mass effect with 5 mm right-to-left midline shift, previously 4 mm. 3. No new acute intracranial abnormality identified.    IMPRESSION: Wesley Burns is a 52 y.o. male with PMH of ESRD on Dialysis, HTN, Hx of CVA without deficits, BPH and Depression that presents to the ED as a code stroke with reports of acute onset Left sided weakness. EMS reports that patient fell out of his bed at approximately 15:40. Patients room mate states that he last saw him normal at 07:00 this morning.   Room mate states that patient went back to bed at approximately 07:00 and he heard him fall out of bed at 15:40 today. On admission he has Left sided weakness, Left facial droop, decreased sensation on the Left, slightly dysarthric speech. CT Head reveals:  Acute right thalamic hemorrhage with mild cytotoxic edema New age indeterminate left thalamic lacunar infarct  Suspected Etiology: HTN hemorrhage and small vessel disease Resultant Symptoms: Left sided weakness, Left facial droop, decreased sensation on Left Stroke Risk Factors: hypertension, ESRD on Dialysis Other Stroke Risk Factors: Advanced age, ?ETOH use, Hx stroke  Outstanding Stroke Work-up Studies:    Work up  completed at this time                                                      ASSESSMENT 12/08/2017: Neuro exam remained stable.  Repeat head CT stable.  Mild midline shift.  Passed SLP eval.  Home blood pressure medications restarted.  Consulted nephrology for dialysis.  Appreciate assistance.  Plan to continue in ICU today and likely transfer to floor in a.m.  We will continue to hold aspirin for now.  PLAN  12/09/2017: Continue in ICU for today, Will likely transfer to floor in AM. Check CT and a gram in the morning. 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 Restarted home B/P medication Norvasc 12/08/2017 Home Meds: Norvasc, Clonidine  HYPERLIPIDEMIA:   Lipid panel pending Home Meds:  NONE LDL  goal < 70 Will started on  Statin based on LDL results Continue statin at discharge  DIABETES:    HgA1C pending HgbA1c goal < 7.0 Currently on:  Novolog Continue CBG monitoring and SSI to maintain glucose 140-180 mg/dl DM education   Other Active Problems: Active Problems:   ICH (intracerebral hemorrhage) (HCC)    Dysphagia, post-stroke   Type 2 diabetes mellitus with peripheral neuropathy (HCC)   History of tobacco abuse   Benign essential HTN   ESRD on dialysis ([place])   History of traumatic brain injury   History of CVA with residual deficit   History of transmetatarsal amputation of foot [facility])    Hospital day # 2 VTE prophylaxis: SCD's  Diet - DIET DYS 3 Room service appropriate? Yes; Fluid consistency: Nectar Thick   FAMILY UPDATES: No family at bedside  TEAM UPDATES:Itzell Bendavid, Lucy Antigua, MD   Prior Home Stroke Medications:  No antithrombotic  Discharge Stroke Meds:  Please discharge patient on TBD   Disposition: 01-Home or Self Care Therapy Recs:               CIR Home Equipment:         TBD Follow Up:  Follow-up Information    Garvin Fila, MD. Schedule an appointment as soon as possible for a visit in 6 week(s).   Specialties:  Neurology, Radiology Contact information: [address] [place] [address] [place], [place], MD -PCP Follow up in 1-2 weeks         Plan mobilize out of bed. Therapy consults. Increase Zanaflex to 2 mg 3 times daily as needed for spasms. Transfer to the floor   I have personally examined this patient, reviewed notes, independently viewed imaging studies, participated in medical decision making and plan of care.ROS completed by me personally and pertinent positives fully documented  I have made any additions or clarifications directly to the above note.  .This patient is critically ill and at significant risk of neurological worsening, death and care requires constant monitoring of vital signs, hemodynamics,respiratory and cardiac monitoring, extensive review of multiple databases, frequent neurological assessment, discussion with family, other specialists and medical decision making of high complexity.I have made any additions or clarifications directly to the above note.This critical care time does not reflect procedure  time, or teaching time or supervisory time of PA/NP/Med Resident etc but could involve care discussion time.  I spent 30 minutes of neurocritical care time  in the care of  this patient.      Antony Contras, MD Medical Director [facility] Pager: [phone] 12/09/2017 3:12 PM  Please page stroke NP  Or  PA  Or MD from 8am -4 pm  as this patient from this time will be  followed by the stroke.   You can look them up on [email or web address]  Password [email or web address]

## 2017-12-09 NOTE — Progress Notes (Signed)
Inpatient Rehabilitation  Met with patient at bedside and spoke with partner over the phone to discuss team's recommendation for IP Rehab.  Shared booklets, insurance verification letter, and answered questions.  Patient and partner confirm that their [employer] Commercial health plan has termed.  Plan to follow up with HD CSW tomorrow.  As insurance coverage for HD is barrier to any discharge.  Will update the team as I know.  Call if questions.    Melissa Bowie, M.A., CCC/SLP Admission Coordinator  [facility]  Cell [phone]  

## 2017-12-09 NOTE — Care Management Note (Signed)
Case Management Note  Patient Details  Name: Wesley Burns MRN: [id number] Date of Birth: 1966/09/10  Subjective/Objective:   Pt admitted on 12/07/17 with right thalamic hemorrhagic stroke.  PTA, pt independent, lives at home with significant other.               Action/Plan: PT/OT recommending CIR at discharge, and consult in progress.  Will follow for discharge planning.    Expected Discharge Date:                  Expected Discharge Plan:  IP Rehab Facility  In-House Referral:     Discharge planning Services  CM Consult  Post Acute Care Choice:    Choice offered to:     DME Arranged:    DME Agency:     HH Arranged:    HH Agency:     Status of Service:  In process, will continue to follow  If discussed at Long Length of Stay Meetings, dates discussed:    Additional Comments:  Quintella Baton, RN, BSN  Trauma/Neuro ICU Case Manager [phone]

## 2017-12-09 NOTE — Progress Notes (Signed)
[facility] Progress Note   Subjective:  Seen in room, s/p HD this morning. No CP or dyspnea.  Objective Vitals:   12/09/17 1030 12/09/17 1045 12/09/17 1100 12/09/17 1109  BP: 137/67 135/71 (!) 144/70 (!) 145/72  Pulse: 79 79 79 81  Resp: 15 15 16 14   Temp:    98.5 F (36.9 C)  TempSrc:    Oral  SpO2: 100% 99% 98% 97%  Weight:    67 kg (147 lb 11.3 oz)  Height:       Physical Exam General: Well appearing, NAD Heart: RRR; no murmur Lungs: CTA anteriorly Extremities: No LE edema Dialysis Access: AVF + thrill  Additional Objective Labs: Basic Metabolic Panel: Recent Labs  Lab 12/07/17 1619 12/07/17 1632 12/09/17 0455  NA 127* 128* 129*  K 3.4* 3.6 4.4  CL 91* 93* 95*  CO2 22  --  16*  GLUCOSE 97 102* 122*  BUN 24* 26* 32*  CREATININE 9.59* 10.30* 11.01*  CALCIUM 8.2*  --  7.4*  PHOS  --   --  3.3   Liver Function Tests: Recent Labs  Lab 12/07/17 1619 12/09/17 0455  AST 18 15  ALT 17 12*  ALKPHOS 345* 242*  BILITOT 1.2 0.8  PROT 6.7 5.5*  ALBUMIN 3.4* 2.8*   CBC: Recent Labs  Lab 12/07/17 1619 12/07/17 1632 12/09/17 0455  WBC 6.9  --  7.2  NEUTROABS 3.7  --   --   HGB 10.4* 11.2* 8.4*  HCT 31.9* 33.0* 25.1*  MCV 86.7  --  89.6  PLT 394  --  362   Recent Labs  Lab 12/08/17 1326 12/08/17 1639 12/08/17 1748 12/08/17 2204 12/09/17 0837  GLUCAP 108* 145* 129* 112* 72   Studies/Results: Ct Angio Head W Or Wo Contrast  Result Date: 12/07/2017 CLINICAL DATA:  Intracerebral hemorrhage. Left-sided weakness and slurred speech. EXAM: CT ANGIOGRAPHY HEAD TECHNIQUE: Multidetector CT imaging of the head was performed using the standard protocol during bolus administration of intravenous contrast. Multiplanar CT image reconstructions and MIPs were obtained to evaluate the vascular anatomy. CONTRAST:  50 mL Isovue 370 COMPARISON:  Noncontrast head CT today. No prior angiographic imaging. FINDINGS: Anterior circulation: The internal carotid  arteries are patent from skullbase to carotid termini with bilateral siphon atherosclerosis which results in minimal right greater than left paraclinoid segment narrowing. The ACAs and MCAs are patent without evidence of proximal branch occlusion or significant proximal stenosis. There is an incidental fenestration of the proximal left M1 segment. No aneurysm or vascular malformation. Posterior circulation: The visualized distal vertebral arteries are patent to the basilar and codominant. There is mild right greater than left V4 segment atherosclerosis without significant stenosis. Dominant right PICA and left AICA. Patent bilateral SCAs. Widely patent basilar artery. Patent right posterior communicating artery. Focal calcified plaque just beyond the right P1-P2 junction results in mild-to-moderate stenosis. There may also be an additional moderate PCA stenosis just proximal to this in the distal P1 segment, and there is a moderate to severe mid right P2 stenosis. The left PCA is patent without significant proximal stenosis. No aneurysm or vascular malformation. No spot sign associated with the right thalamic hemorrhage. Venous sinuses: Grossly patent. Anatomic variants: None of significance. Delayed phase: Not performed. IMPRESSION: 1. No aneurysm, vascular malformation, or spot sign associated with the right thalamic hemorrhage. 2. Patent circle of Willis with intracranial atherosclerosis as above. Moderate to severe right PCA stenoses. Electronically Signed   By: Jolaine Click.D.  On: 12/07/2017 17:25   Ct Head Wo Contrast  Result Date: 12/08/2017 CLINICAL DATA:  52 y/o  M; follow-up of stroke. EXAM: CT HEAD WITHOUT CONTRAST TECHNIQUE: Contiguous axial images were obtained from the base of the skull through the vertex without intravenous contrast. COMPARISON:  12/07/2017 CT head FINDINGS: Brain: Stable right thalamic hemorrhage measuring 2.2 x 2.3 x 3.2 (volume = 8.5) cm (AP x ML x CC series 3, image 17 and  series 5, image 37). Stable small volume of hemorrhage within occipital horn of right lateral ventricle. Stable mass effect with 5 mm right-to-left midline shift and partial effacement of right lateral ventricle. No new hemorrhage, large acute stroke, or focus of mass effect identified. Stable chronic microvascular ischemic changes and parenchymal volume loss of the brain. Vascular: Calcific atherosclerosis of carotid siphons. Skull: Normal. Negative for fracture or focal lesion. Sinuses/Orbits: Opacification of mastoid tips. Normal aeration of paranasal sinuses. Orbits are unremarkable. Other: None. IMPRESSION: 1. Stable right thalamic hematoma and small volume of hemorrhage in right lateral ventricle. 2. Mild increase of mass effect with 5 mm right-to-left midline shift, previously 4 mm. 3. No new acute intracranial abnormality identified. Electronically Signed   By: Mitzi Hansen M.D.   On: 12/08/2017 01:55   Ct Head Code Stroke Wo Contrast  Result Date: 12/07/2017 CLINICAL DATA:  Code stroke.  Left facial droop and slurred speech. EXAM: CT HEAD WITHOUT CONTRAST TECHNIQUE: Contiguous axial images were obtained from the base of the skull through the vertex without intravenous contrast. COMPARISON:  Brain MRI 06/29/2016 FINDINGS: Brain: There is an acute hemorrhage centered in the right thalamus measuring 2.4 x 2.1 x 3.2 cm (approximate volume of 8 cc). There is mild surrounding edema and mild local mass effect with 4 mm of leftward midline shift at the level of the thalami. There is a small amount of blood in the right lateral ventricle. There is slight enlargement of the right temporal horn which may reflect early trapping. There is encephalomalacia in the right basal ganglia/ external capsule related to a prior hemorrhage. There is a new lacunar infarct in the left thalamus. Chronic lacunar infarcts are present in the basal ganglia bilaterally. No acute cortically based infarct is identified. There  is no extra-axial fluid collection. Bilateral periventricular white matter hypodensities are nonspecific but compatible with mild chronic small vessel ischemic disease. Vascular: Calcified atherosclerosis at the skullbase. No hyperdense vessel. Skull: No fracture or focal osseous lesion. Sinuses/Orbits: Visualized paranasal sinuses are clear. Chronic bilateral mastoid effusions. Rightward gaze. Other: None. ASPECTS [facility] Stroke Program Early CT Score) Not scored due to hemorrhage. IMPRESSION: 1. Acute right thalamic hemorrhage. Mild edema and local mass effect. 2. 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 3. New age indeterminate left thalamic lacunar infarct. Critical Value/emergent results were called by telephone at the time of interpretation on 12/07/2017 at 4:45 pm to Dr. Laurence Slate, who verbally acknowledged these results. Electronically Signed   By: Sebastian Ache M.D.   On: 12/07/2017 16:50   Medications:  .  stroke: mapping our early stages of recovery book   Does not apply Once  . amLODipine  10 mg Oral Daily  . insulin aspart  0-9 Units Subcutaneous TID WC  . pantoprazole  40 mg Oral QHS  . senna-docusate  1 tablet Oral BID  . sevelamer carbonate  800 mg Oral TID WC    Dialysis Orders: TTS at [facility] 4hr, 400/800, EDW 65kg, 2K/2Ca bath, AVF, heparin 2000 bolus -  Mircera IV q 2 weeks (last 1/3) - Calcitriol 0.28mcg PO q HD  Assessment/Plan: 1.  R Thalamic Hemorrhagic Stroke (w/ L sided deficits): Per neuro.  2.  ESRD: Continue TTS schedule. 3.  Hypertension/volume: BP controlled, no edema. 3kg up per weights. 4.  Anemia: Hgb 11.2. Not due for ESA again. 5.  Metabolic bone disease: Ca ok, Phos ok. Resume home meds. 6.  Type 2 DM: Per primary.  Ozzie Hoyle, PA-C 12/09/2017, 12:46 PM  [facility] Pager: [phone]

## 2017-12-10 LAB — GLUCOSE, CAPILLARY
GLUCOSE-CAPILLARY: 124 mg/dL — AB (ref 65–99)
Glucose-Capillary: 93 mg/dL (ref 65–99)
Glucose-Capillary: 116 mg/dL — ABNORMAL HIGH (ref 65–99)
Glucose-Capillary: 92 mg/dL (ref 65–99)

## 2017-12-10 MED ORDER — LOPERAMIDE HCL 2 MG PO CAPS
4.0000 mg | ORAL_CAPSULE | Freq: Once | ORAL | Status: AC
  Administered 2017-12-10: 4 mg via ORAL
  Filled 2017-12-10: qty 2

## 2017-12-10 MED ORDER — CALCITRIOL 0.5 MCG PO CAPS
0.7500 ug | ORAL_CAPSULE | ORAL | Status: DC
  Administered 2017-12-11 – 2018-01-10 (×13): 0.75 ug via ORAL
  Filled 2017-12-10 (×15): qty 1

## 2017-12-10 MED ORDER — LORAZEPAM 0.5 MG PO TABS
0.5000 mg | ORAL_TABLET | Freq: Once | ORAL | Status: AC | PRN
  Administered 2017-12-30: 0.5 mg via ORAL
  Filled 2017-12-10: qty 1

## 2017-12-10 NOTE — Progress Notes (Signed)
  [facility] Progress Note   Subjective:  Seen in room, no c/o today. S/p 2L UF with HD yesterday.  Objective Vitals:   12/09/17 1800 12/09/17 1856 12/09/17 2031 12/10/17 0511  BP:  (!) 163/71 (!) 161/74 (!) 169/76  Pulse: 94 87 90 83  Resp: 20 20 20 20   Temp:  98.4 F (36.9 C) 98.8 F (37.1 C) 100.1 F (37.8 C)  TempSrc:  Oral Oral Oral  SpO2: 98% 100% 100% 100%  Weight:      Height:       Physical Exam General: Well appearing, NAD Heart: RRR; no murmur Lungs: CTA anteriorly Extremities: No LE edema Dialysis Access: AVF + thrill  Additional Objective Labs: Basic Metabolic Panel: Recent Labs  Lab 12/07/17 1619 12/07/17 1632 12/09/17 0455  NA 127* 128* 129*  K 3.4* 3.6 4.4  CL 91* 93* 95*  CO2 22  --  16*  GLUCOSE 97 102* 122*  BUN 24* 26* 32*  CREATININE 9.59* 10.30* 11.01*  CALCIUM 8.2*  --  7.4*  PHOS  --   --  3.3   Liver Function Tests: Recent Labs  Lab 12/07/17 1619 12/09/17 0455  AST 18 15  ALT 17 12*  ALKPHOS 345* 242*  BILITOT 1.2 0.8  PROT 6.7 5.5*  ALBUMIN 3.4* 2.8*   CBC: Recent Labs  Lab 12/07/17 1619 12/07/17 1632 12/09/17 0455  WBC 6.9  --  7.2  NEUTROABS 3.7  --   --   HGB 10.4* 11.2* 8.4*  HCT 31.9* 33.0* 25.1*  MCV 86.7  --  89.6  PLT 394  --  362   CBG: Recent Labs  Lab 12/08/17 2204 12/09/17 0837 12/09/17 1656 12/10/17 0002 12/10/17 0602  GLUCAP 112* 72 130* 88 93   Studies/Results: No results found. Medications: . sodium chloride    . sodium chloride     .  stroke: mapping our early stages of recovery book   Does not apply Once  . amLODipine  10 mg Oral Daily  . insulin aspart  0-9 Units Subcutaneous TID WC  . pantoprazole  40 mg Oral QHS  . senna-docusate  1 tablet Oral BID  . sevelamer carbonate  800 mg Oral TID WC   Dialysis Orders: TTS at [facility] 4hr,400/800, EDW 65kg, 2K/2Ca bath, AVF, heparin 2000 bolus - Mircera [facility] IV q 2 weeks (last 1/3) - Calcitriol 0.65mcg PO q  HD  Assessment/Plan: 1.  R Thalamic Hemorrhagic Stroke (w/ L sided deficits): Per neuro.  2.  ESRD: Continue TTS schedule, next 1/10. NO HEPARIN WITH HD. 3.  Hypertension/volume: BP high, UF as tolerated. PO meds. 4.  Anemia: Hgb 11.2 -> 8.4. Not due for ESA again. 5.  Metabolic bone disease: Corr Ca/Phos ok. Follow. Resuem VDRA. 6.  Type 2 DM: Per primary.   70m, PA-C 12/10/2017, 11:41 AM  [facility] Pager: [phone]  Pt seen, examined and agree w A/P as above.  [phone] MD Wesley Burns pager [phone]   12/10/2017, 12:12 PM

## 2017-12-10 NOTE — Plan of Care (Signed)
  Education: Knowledge of General Education information will improve 12/10/2017 0800 - Progressing by Olena Mater, RN Note POC reviewed with pt.

## 2017-12-10 NOTE — Progress Notes (Signed)
NEUROHOSPITALISTS - STROKE TEAM DAILY PROGRESS NOTE  ADMISSION HISTORY Wesley Burns is an 52 y.o. male PMH of ESRD on Dialysis, HTN, Hx of CVA without deficits, BPH and Depression that presents to the ED as a code stroke with reports of acute onset Left sided weakness. EMS reports that patient fell out of his bed at approximately 15:40. Patients room mate states that he last saw him normal at 07:00 this morning.   Room mate states that patient went back to bed at approximately 07:00 and he heard him fall out of bed at 15:40 today. On admission he has Left sided weakness, Left facial droop, decreased sensation on the Left, slightly dysarthric speech. CT Head reveals:Acute right thalamic hemorrhage and New age indeterminate left thalamic lacunar infarct  Date last known well: Date: 12/07/2017 Time last known well: Time: 07:00 tPA Given: No: hemorrhage Modified Rankin: Rankin Score=3 NIHSS: 12 Intracerebral Hemorrhage (ICH) Score Total: 1    SUBJECTIVE No family at bedside.  Patient found lying in bed in no acute distress. He did obtain relief from one dose of Zanaflex yesterday. He is having diarrhea and was given Imodium  Physical Examination: Vitals:   12/09/17 1856 12/09/17 2031 12/10/17 0511 12/10/17 1147  BP: (!) 163/71 (!) 161/74 (!) 169/76 (!) 170/68  Pulse: 87 90 83 79  Resp: 20 20 20 19   Temp: 98.4 F (36.9 C) 98.8 F (37.1 C) 100.1 F (37.8 C) 99 F (37.2 C)  TempSrc: Oral Oral Oral Oral  SpO2: 100% 100% 100% 100%  Weight:      Height:       HEENT-  Normocephalic, Normal external eye/conjunctiva.  Normal external ears. Normal external nose, mucus membranes and septum.   Cardiovascular - Regular rate and rhythm  Respiratory - Lungs clear bilaterally. Non-labored breathing, No wheezing. Abdomen - soft and non-tender, BS normal Extremities- no edema or cyanosis Skin-warm and dry  Neurological Examination Mental Status: Awake,oriented x 3   Speech is slightly dysarthric. Able to follow 3 step commands without difficulty. Cranial Nerves: II: Left field cut. Pupils are equal, round, and reactive to light.   III,IV, VI: EOMI without ptosis or diploplia. No nystagmus.   V: Facial sensation is decreased on the Left VII: Facial movement is decreased on the Left VIII: hearing is intact to voice X: Uvula elevates symmetrically XI: Shoulder shrug is decreased on the left XII: tongue is midline without atrophy or fasciculations.  Motor: Tone is normal. Bulk is normal. 5/5 strength on the Right.  Dense left hemiplegia and Strength is 0/5 on the Left.   Sensor: Sensation is decreased on the Left in the arms and legs. Normal on the Right Deep Tendon Reflexes: 1+ and symmetric throughout in the biceps and patellae Plantars: Toes are downgoing bilaterally.  Cerebellar: unable to perform finger-to-nose on Left Gait: Truncal ataxia to Left   Lab Results: CBC: Recent Labs  Lab 12/07/17 1619 12/07/17 1632 12/09/17 0455  WBC 6.9  --  7.2  HGB 10.4* 11.2* 8.4*  HCT 31.9* 33.0* 25.1*  MCV 86.7  --  89.6  PLT 394  --  409   Basic Metabolic Panel: Recent Labs  Lab 12/07/17 1619 12/07/17 1632 12/09/17 0455  NA 127* 128* 129*  K 3.4* 3.6 4.4  CL 91* 93* 95*  CO2 22  --  16*  GLUCOSE 97 102* 122*  BUN 24* 26* 32*  CREATININE 9.59* 10.30* 11.01*  CALCIUM 8.2*  --  7.4*  MG  --   --  1.7  PHOS  --   --  3.3   Liver Function Tests: Recent Labs  Lab 12/07/17 1619 12/09/17 0455  AST 18 15  ALT 17 12*  ALKPHOS 345* 242*  BILITOT 1.2 0.8  PROT 6.7 5.5*  ALBUMIN 3.4* 2.8*   Coagulation Studies: Recent Labs    12/07/17 1619  APTT 31  INR 0.93   Imaging: Ct Head Code Stroke Wo Contrast Result Date: 12/07/2017 IMPRESSION: 1. Acute right thalamic hemorrhage. Mild edema and local mass effect. 2. 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 3. New age indeterminate left thalamic  lacunar infarct. Critical Value/emergent results were called by telephone at the time of interpretation on 12/07/2017 at 4:45 pm to Dr. Lorraine Lax, who verbally acknowledged these results. Electronically Signed   By: Logan Bores M.D.   On: 12/07/2017 16:50   Ct Angio Head W Or Wo Contrast Result Date: 12/07/2017 IMPRESSION: 1. No aneurysm, vascular malformation, or spot sign associated with the right thalamic hemorrhage. 2. Patent circle of Willis with intracranial atherosclerosis as above. Moderate to severe right PCA stenoses.   Ct Head Wo Contrast Result Date: 12/08/2017 IMPRESSION: 1. Stable right thalamic hematoma and small volume of hemorrhage in right lateral ventricle. 2. Mild increase of mass effect with 5 mm right-to-left midline shift, previously 4 mm. 3. No new acute intracranial abnormality identified.    IMPRESSION: [name] is a 52 y.o. male with PMH of ESRD on Dialysis, HTN, Hx of CVA without deficits, BPH and Depression that presents to the ED as a code stroke with reports of acute onset Left sided weakness. EMS reports that patient fell out of his bed at approximately 15:40. Patients room mate states that he last saw him normal at 07:00 this morning.   Room mate states that patient went back to bed at approximately 07:00 and he heard him fall out of bed at 15:40 today. On admission he has Left sided weakness, Left facial droop, decreased sensation on the Left, slightly dysarthric speech. CT Head reveals:  Acute right thalamic hemorrhage with mild cytotoxic edema New age indeterminate left thalamic lacunar infarct  Suspected Etiology: HTN hemorrhage and small vessel disease Resultant Symptoms: Left sided weakness, Left facial droop, decreased sensation on Left Stroke Risk Factors: hypertension, ESRD on Dialysis Other Stroke Risk Factors: Advanced age, ?ETOH use, Hx stroke  Outstanding Stroke Work-up Studies:    Work up completed at this time                                                       ASSESSMENT 12/08/2017: Neuro exam remained stable.  Repeat head CT stable.  Mild midline shift.  Passed SLP eval.  Home blood pressure medications restarted.  Consulted nephrology for dialysis.  Appreciate assistance.  Plan to continue in ICU today and likely transfer to floor in a.m.  We will continue to hold aspirin for now.  PLAN  12/10/2017: Continue in ICU for today, Will likely transfer to floor in AM. Check CT and a gram in the morning. 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 Restarted home B/P medication Norvasc 12/08/2017 Home Meds: Norvasc, Clonidine  HYPERLIPIDEMIA:   Lipid panel pending Home Meds:  NONE LDL  goal < 70 Will started on  Statin based on LDL results Continue statin at discharge  DIABETES:    HgA1C pending HgbA1c goal < 7.0 Currently on:  Novolog Continue CBG monitoring and SSI to maintain glucose 140-180 mg/dl DM education   Other Active Problems: Active Problems:   IVH (intraventricular hemorrhage) (HCC)   Dysphagia, post-stroke   Type 2 diabetes mellitus with  peripheral neuropathy (HCC)   History of tobacco abuse   Benign essential HTN   ESRD on dialysis ([place])   History of traumatic brain injury   History of CVA with residual deficit   History of transmetatarsal amputation of foot [facility])    Hospital day # 3 VTE prophylaxis: SCD's  Diet - DIET DYS 3 Room service appropriate? Yes; Fluid consistency: Nectar Thick   FAMILY UPDATES: No family at bedside  TEAM UPDATES:Teigen Bellin, Lucy [place], MD   Prior Home Stroke Medications:  No antithrombotic  Discharge Stroke Meds:  Please discharge patient on TBD   Disposition: 01-Home or Self Care Therapy Recs:               CIR Home Equipment:         TBD Follow Up:  Follow-up Information    Garvin Fila, MD. Schedule an appointment as soon as possible for a visit in 6 week(s).   Specialties:  Neurology, Radiology Contact information: [address] [place] [address] [place], [place], MD -PCP Follow up in 1-2 weeks         Plan mobilize out of bed. Therapy consults. The tenia Zanaflex to 2 mg 3 times daily as needed for spasms. Continue Imodium for diarrhea   I have personally examined this patient, reviewed notes, independently viewed imaging studies, participated in medical decision making and plan of care.ROS completed by me personally and pertinent positives fully documented  I have made any additions or clarifications directly to the above note.  .This patient is critically ill and at significant risk of neurological worsening, death and care requires constant monitoring of vital signs, hemodynamics,respiratory and cardiac monitoring, extensive review of multiple databases, frequent neurological assessment, discussion with family, other specialists and medical decision making of high complexity.I have made any additions or clarifications directly to the above note.This critical care time does not reflect procedure time, or teaching time or supervisory time of  PA/NP/Med Resident etc but could involve care discussion time.  I spent 30 minutes of neurocritical care time  in the care of  this patient.      Antony Contras, MD Medical Director [facility] Pager: [phone] 12/10/2017 1:38 PM  Please page stroke NP  Or  PA  Or MD from 8am -4 pm  as this patient from this time will be  followed by the stroke.   You can look them up on [email or web address]  Password [email or web address]

## 2017-12-10 NOTE — Progress Notes (Addendum)
CM informed that patient had questions for the CM. CM met with the patient and his significant other and they had questions about disability and CIR. Per the significant other the patients insurance is no longer effective. They are asking for someone to discuss Medicaid and disability. CM sent email to the financial counselor assigned to the J[employer].  Pt also had questions about where he stood with CIR since his insurance is no longer active. CM sent message to [facility] with CIR who has been following the patient.  CM will follow for d/c needs.   11:14 am: CM spoke to [facility] with CIR. She had spoken to [place] the CSW at the patients dialysis center. Cassell Smiles has attempted to get Mr Harnois to apply for Medicare but he has yet to do so. Per Melissa patients significant other needs to get [facility] signed and apply for Medicare for Mr Lara at the [employer]. CM spoke to Mr Sciarra and he is interested in patient being his HCPOA.  CM called Phil (significant other) and informed him of above. He is coming back to the hospital this afternoon and will complete the HCPOA and then it can be notarized by spiritual care. He will then take to DSS. CM left HCPOA packet in the patients room. Melissa with CIR doesn't feel they will have a bed for the patient today and probably not tomorrow. CM informed the patient and he is agreeable to being faxed out to SNFs in the area. Phil also updated. CSW informed.  Financial counseling to see patient after 3 pm today also.

## 2017-12-10 NOTE — NC FL2 (Signed)
[place] MEDICAID FL2 LEVEL OF CARE SCREENING TOOL     IDENTIFICATION  Patient Name: Wesley Burns Birthdate: May 21, 1966 Sex: male Admission Date (Current Location): 12/07/2017  [facility] and [place] Number:  Producer, television/film/video and Address:  [facility], [address] [place], [place] [address]      Provider Number: [id number]  Attending Physician Name and Address:  Micki Riley, MD  Relative Name and Phone Number:       Current Level of Care: Hospital Recommended Level of Care: Skilled Nursing Facility Prior Approval Number:    Date Approved/Denied:   PASRR Number: [id number]  Discharge Plan: SNF    Current Diagnoses: Patient Active Problem List   Diagnosis Date Noted  . Dysphagia, post-stroke   . Type 2 diabetes mellitus with peripheral neuropathy (HCC)   . History of tobacco abuse   . Benign essential HTN   . ESRD on dialysis (HCC)   . History of traumatic brain injury   . History of CVA with residual deficit   . History of transmetatarsal amputation of foot (HCC)   . IVH (intraventricular hemorrhage) (HCC) 12/07/2017  . Complication of vascular dialysis catheter 11/02/2016  . Lactic acidosis 11/02/2016  . Left knee pain   . Urinary retention   . Pleural effusion   . Chest pain on respiration   . Shortness of breath   . Peripheral edema   . Labile blood pressure   . Sleep disturbance   . Left hemiparesis (HCC) 07/04/2016  . Acute on chronic renal failure (HCC)   . Normochromic normocytic anemia   . DM type 2 with diabetic peripheral neuropathy (HCC)   . Hx of amputation   . Adjustment disorder with mixed anxiety and depressed mood   . Marijuana abuse   . Acute blood loss anemia   . Anemia of chronic disease   . Bowel incontinence   . Tobacco abuse   . Low grade fever   . Hypomagnesemia   . Right knee pain   . Diarrhea   . Spasticity   . Nontraumatic acute hemorrhage of basal ganglia (HCC) 06/28/2016  .  Intracerebral hemorrhage (HCC) 06/28/2016  . Cytotoxic brain edema (HCC) 06/28/2016  . Brain herniation (HCC) 06/28/2016  . Fracture, shoulder 09/04/2015  . ESRD (end stage renal disease) on dialysis (HCC)   . Alcoholic cirrhosis of liver with ascites (HCC)   . Alcohol abuse 04/24/2015  . Hypokalemia 04/21/2015  . Anasarca   . Hyponatremia   . Dislocated hip (HCC) 12/24/2013  . 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 History of total hip replacement 12/24/2013  . Proteinuria 08/31/2013  . Microalbuminuria 07/30/2013  . Charcot foot due to diabetes mellitus (HCC) 07/30/2013  . Noncompliance with medications 07/30/2013  . Essential hypertension 04/06/2012  . Essential hypertension, benign 04/06/2012    Orientation RESPIRATION BLADDER Height & Weight     Self, Time, Situation, Place  Normal External catheter, Incontinent(catheter placed 12/07/17) Weight: 147 lb 11.3 oz (67 kg) Height:  5\' 11"  (180.3 cm)  BEHAVIORAL SYMPTOMS/MOOD NEUROLOGICAL BOWEL NUTRITION STATUS      Incontinent Diet(mechanical soft, nectar thick liquids)  AMBULATORY STATUS COMMUNICATION OF NEEDS Skin   Extensive Assist Verbally Other (Comment)(diabetic toe ulcer, L foot; gauze dressing)                       Personal Care Assistance Level of Assistance  Bathing, Feeding, Dressing Bathing Assistance:  Maximum assistance Feeding assistance: Limited assistance Dressing Assistance: Maximum assistance     Functional Limitations Info  Hearing, Sight, Speech Sight Info: Adequate Hearing Info: Adequate Speech Info: Adequate    SPECIAL CARE FACTORS FREQUENCY  PT (By licensed PT), OT (By licensed OT), Speech therapy     PT Frequency: 5x/wk OT Frequency: 5x/wk     Speech Therapy Frequency: 5x/wk      Contractures Contractures Info: Not present    Additional Factors Info  Code Status, Allergies, Insulin Sliding Scale Code Status Info: Full Allergies Info: Prednisone,  Adhesive Tape   Insulin Sliding Scale Info: 0-9 units 3x/day with meals       Current Medications (12/10/2017):  This is the current hospital active medication list Current Facility-Administered Medications  Medication Dose Route Frequency Provider Last Rate Last Dose  .  stroke: mapping our early stages of recovery book   Does not apply Once Aroor, Sushanth R, MD      . 0.9 %  sodium chloride infusion  100 mL Intravenous PRN Stovall, Wille Celeste, PA-C      . 0.9 %  sodium chloride infusion  100 mL Intravenous PRN Stovall, Wille Celeste, PA-C      . acetaminophen (TYLENOL) tablet 650 mg  650 mg Oral Q4H PRN Aroor, Dara Lords, MD       Or  . acetaminophen (TYLENOL) solution 650 mg  650 mg Per Tube Q4H PRN Aroor, Dara Lords, MD       Or  . acetaminophen (TYLENOL) suppository 650 mg  650 mg Rectal Q4H PRN Aroor, Georgiana Spinner R, MD      . amLODipine (NORVASC) tablet 10 mg  10 mg Oral Daily Costello, Mary A, NP   10 mg at 12/10/17 1020  . insulin aspart (novoLOG) injection 0-9 Units  0-9 Units Subcutaneous TID WC Micki Riley, MD   1 Units at 12/09/17 1719  . labetalol (NORMODYNE,TRANDATE) injection 5 mg  5 mg Intravenous Q2H PRN Beryl Meager, NP   5 mg at 12/09/17 0913  . lidocaine (PF) (XYLOCAINE) 1 % injection 5 mL  5 mL Intradermal PRN Julien Nordmann, PA-C      . lidocaine-prilocaine (EMLA) cream 1 application  1 application Topical PRN Julien Nordmann, PA-C      . LORazepam (ATIVAN) tablet 0.5 mg  0.5 mg Oral Once PRN Milon Dikes, MD      . pantoprazole (PROTONIX) EC tablet 40 mg  40 mg Oral QHS Rumbarger, Faye Ramsay, RPH   40 mg at 12/09/17 2049  . pentafluoroprop-tetrafluoroeth (GEBAUERS) aerosol 1 application  1 application Topical PRN Stovall, Wille Celeste, PA-C      . RESOURCE THICKENUP CLEAR   Oral PRN Micki Riley, MD      . senna-docusate (Senokot-S) tablet 1 tablet  1 tablet Oral BID Aroor, Dara Lords, MD   1 tablet at 12/08/17 2143  . sevelamer carbonate (RENVELA) tablet 800 mg   800 mg Oral TID WC Costello, Mary A, NP   800 mg at 12/10/17 0840  . tiZANidine (ZANAFLEX) tablet 2 mg  2 mg Oral Q8H PRN Micki Riley, MD   2 mg at 12/10/17 1225     Discharge Medications: Please see discharge summary for a list of discharge medications.  Relevant Imaging Results:  Relevant Lab Results:   Additional Information SS#: [id number]; Hemodialysis TTS at [facility]  [place], [place]   Agree with above  Delia Heady, MD Medical Director [facility]  [facility] Pager: [phone] 12/10/2017 12:58 PM

## 2017-12-10 NOTE — Progress Notes (Signed)
Inpatient Rehabilitation  Continuing to follow for a potential admission pending medical readiness and IP Rehab bed availability.  Will notify team of bed availability in the am.  Call if questions.   Charlane Ferretti., CCC/SLP Admission Coordinator  [facility]  Cell [phone]

## 2017-12-10 NOTE — Progress Notes (Signed)
  Speech Language Pathology Treatment: Dysphagia  Patient Details Name: Wesley Burns MRN: [id number] DOB: 1966/08/18 Today's Date: 12/10/2017 Time: 9509-3267 SLP Time Calculation (min) (ACUTE ONLY): 16 min  Assessment / Plan / Recommendation Clinical Impression  Pt tolerating thin liquids without signs of aspiration. Has been consuming them throughout the day. Provided education about upright posture as pt was fully reclined and reportedly drinking in that position. Will advance diet to regular thin and sign off for swallowing.   HPI HPI: Wesley Burns a 52 y.o.malewith ESRD, Type 2 DM, HTN, GERD, and Hx CVA (2017) who was admitted with hemorrhagic stroke. Head CT showed acute R thalamic hemorrhage with indeterminate L thalamic lacunar infarct.       SLP Plan  Continue with current plan of care       Recommendations  Diet recommendations: Thin liquid;Regular Liquids provided via: Cup;Straw Medication Administration: Whole meds with puree Supervision: Patient able to self feed Compensations: Slow rate;Small sips/bites Postural Changes and/or Swallow Maneuvers: Seated upright 90 degrees;Upright 30-60 min after meal                Plan: Continue with current plan of care       GO               [facility], MA CCC-SLP [phone]  Claudine Mouton 12/10/2017, 3:41 PM
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
## 2017-12-10 NOTE — Progress Notes (Signed)
Pt. asked for sleep med around 2350, and informed pt. that he doesn't have anything ordered for sleep; and also asked for anxiety med and nothing ordered for this. Told pt. will page Dr. and unsure if med will be ordered since he's hasn't been taking anything. Paged Dr. Wilford Corner and said that he will order med and only give if pt. call back for med- that he doesn't take med at home for sleep.

## 2017-12-10 NOTE — Progress Notes (Signed)
Neuro NP paged regarding additional dose of imodium for patients diarrhea today

## 2017-12-10 NOTE — Progress Notes (Signed)
Physical Therapy Treatment Patient Details Name: Wesley Burns MRN: [id number] DOB: 1966/10/09 Today's Date: 12/10/2017    History of Present Illness 52 y.o. male with ESRD, Type 2 DM, HTN, GERD, and Hx of Right basal ganglia CVA (2017), right transmet amputation. Admitted with right thalamic hemorrhagic stroke.    PT Comments    Pt is making good progress towards his goals, however continues to be limited by L-sided weakness, decreased balance and decreased cognition especially safety awareness with mobility. Pt currently is mod Ax2 for bed mobility, and maxAx2 for sit<>stand transfers to [place]. Pt able to sit EoB for 5 minutes progressing from modA to min guard. D/c plans remain appropriate. PT will continue to follow acutely.    Follow Up Recommendations  CIR;Supervision/Assistance - 24 hour     Equipment Recommendations  (TBD at next venue)    Recommendations for Other Services       Precautions / Restrictions Precautions Precautions: Fall Precaution Comments: left foot ulcer Restrictions Weight Bearing Restrictions: No    Mobility  Bed Mobility Overal bed mobility: Needs Assistance Bed Mobility: Supine to Sit     Supine to sit: Mod assist;+2 for physical assistance;HOB elevated     General bed mobility comments: modAx2 for L LE managment off of bed, trunk to upright, vc for bedrail assist with R UE, and use of core muscles to sit up and steady in sitting  Transfers Overall transfer level: Needs assistance   Transfers: Sit to/from Stand Sit to Stand: +2 physical assistance;Max assist         General transfer comment: maxAx2 for powerup and steadying in [place], max vc for slowing down and for sequencing LE onto [place] before pulling up, slow processing of vc   Ambulation/Gait             General Gait Details: unsafe to attempt       Modified Rankin (Stroke Patients Only) Modified Rankin (Stroke Patients Only) Pre-Morbid Rankin Score: Severe  disability Modified Rankin: No symptoms     Balance Overall balance assessment: Needs assistance Sitting-balance support: Single extremity supported;Feet supported Sitting balance-Leahy Scale: Poor Sitting balance - Comments: initially requires modA to maintain balance and able to progress to min guard assist with 5 minutes of sitting EoB      Standing balance-Leahy Scale: Zero                              Cognition Arousal/Alertness: Awake/alert Behavior During Therapy: Flat affect;Impulsive Overall Cognitive Status: Impaired/Different from baseline Area of Impairment: Attention;Memory;Following commands;Safety/judgement;Problem solving;Awareness                   Current Attention Level: Alternating   Following Commands: Follows one step commands consistently;Follows one step commands inconsistently Safety/Judgement: Decreased awareness of safety;Decreased awareness of deficits Awareness: Intellectual Problem Solving: Slow processing;Decreased initiation;Requires verbal cues;Difficulty sequencing;Requires tactile cues General Comments: Pt very slow to process info, impulsive with his movements      Exercises      General Comments General comments (skin integrity, edema, etc.): VSS      Pertinent Vitals/Pain Pain Assessment: No/denies pain           PT Goals (current goals can now be found in the care plan section) Acute Rehab PT Goals Patient Stated Goal: return home, read PT Goal Formulation: With patient Time For Goal Achievement: 12/22/17 Potential to Achieve Goals: Good Progress towards PT goals: Progressing toward goals  Frequency    Min 3X/week      PT Plan Current plan remains appropriate    Co-evaluation              AM-PAC PT "6 Clicks" Daily Activity  Outcome Measure  Difficulty turning over in bed (including adjusting bedclothes, sheets and blankets)?: Unable Difficulty moving from lying on back to sitting on  the side of the bed? : Unable Difficulty sitting down on and standing up from a chair with arms (e.g., wheelchair, bedside commode, etc,.)?: Unable Help needed moving to and from a bed to chair (including a wheelchair)?: Total Help needed walking in hospital room?: Total Help needed climbing 3-5 steps with a railing? : Total 6 Click Score: 6    End of Session Equipment Utilized During Treatment: Gait belt Activity Tolerance: Patient tolerated treatment well Patient left: in chair;with call bell/phone within reach;with chair alarm set Nurse Communication: Mobility status PT Visit Diagnosis: Unsteadiness on feet (R26.81);Other abnormalities of gait and mobility (R26.89);Hemiplegia and hemiparesis Hemiplegia - Right/Left: Left Hemiplegia - dominant/non-dominant: Non-dominant Hemiplegia - caused by: Other Nontraumatic intracranial hemorrhage     Time: 1517-6160 PT Time Calculation (min) (ACUTE ONLY): 28 min  Charges:  $Therapeutic Activity: 23-37 mins                    G Codes:       Wesley Burns PT, DPT Acute Rehabilitation  [phone] Pager [phone]    Wesley Burns 12/10/2017, 11:48 AM

## 2017-12-11 ENCOUNTER — Inpatient Hospital Stay (HOSPITAL_COMMUNITY): Payer: Self-pay

## 2017-12-11 DIAGNOSIS — I639 Cerebral infarction, unspecified: Secondary | ICD-10-CM

## 2017-12-11 LAB — GLUCOSE, CAPILLARY
GLUCOSE-CAPILLARY: 124 mg/dL — AB (ref 65–99)
Glucose-Capillary: 81 mg/dL (ref 65–99)
Glucose-Capillary: 101 mg/dL — ABNORMAL HIGH (ref 65–99)
Glucose-Capillary: 82 mg/dL (ref 65–99)

## 2017-12-11 LAB — RENAL FUNCTION PANEL
Albumin: 2.7 g/dL — ABNORMAL LOW (ref 3.5–5.0)
Anion gap: 9 (ref 5–15)
BUN: 18 mg/dL (ref 6–20)
CO2: 25 mmol/L (ref 22–32)
Calcium: 8.5 mg/dL — ABNORMAL LOW (ref 8.9–10.3)
Chloride: 96 mmol/L — ABNORMAL LOW (ref 101–111)
Creatinine, Ser: 7.65 mg/dL — ABNORMAL HIGH (ref 0.61–1.24)
GFR calc Af Amer: 8 mL/min — ABNORMAL LOW (ref >60)
GFR calc non Af Amer: 7 mL/min — ABNORMAL LOW (ref >60)
Glucose, Bld: 76 mg/dL (ref 65–99)
Phosphorus: 3.8 mg/dL (ref 2.5–4.6)
Potassium: 5 mmol/L (ref 3.5–5.1)
Sodium: 130 mmol/L — ABNORMAL LOW (ref 135–145)

## 2017-12-11 LAB — CBC
HCT: 27.3 % — ABNORMAL LOW (ref 39.0–52.0)
Hemoglobin: 8.7 g/dL — ABNORMAL LOW (ref 13.0–17.0)
MCH: 29 pg (ref 26.0–34.0)
MCHC: 31.9 g/dL (ref 30.0–36.0)
MCV: 91 fL (ref 78.0–100.0)
Platelets: 370 10*3/uL (ref 150–400)
RBC: 3 MIL/uL — ABNORMAL LOW (ref 4.22–5.81)
RDW: 18.7 % — ABNORMAL HIGH (ref 11.5–15.5)
WBC: 10.1 10*3/uL (ref 4.0–10.5)

## 2017-12-11 MED ORDER — CLONIDINE HCL 0.1 MG PO TABS
0.1000 mg | ORAL_TABLET | Freq: Two times a day (BID) | ORAL | Status: DC
  Administered 2017-12-11 – 2017-12-12 (×3): 0.1 mg via ORAL
  Filled 2017-12-11 (×3): qty 1

## 2017-12-11 MED ORDER — ATORVASTATIN CALCIUM 10 MG PO TABS
10.0000 mg | ORAL_TABLET | Freq: Every day | ORAL | Status: DC
  Administered 2017-12-11 – 2018-01-02 (×22): 10 mg via ORAL
  Filled 2017-12-11 (×24): qty 1

## 2017-12-11 NOTE — Progress Notes (Signed)
Inpatient Rehabilitation  Spoke with patient's partner Michele Mcalpine to discuss IP Rehab bed offer for ~3 week length of stay and his role as a caregiver to provide Min-Mod 24/7 physical assist upon discharge.  Michele Mcalpine stated that he has to start working and cannot commit to this nor assisting patient with toileting.  As a result, we are not able to offer a bed.  Will sign off at this time.  Notified case Production designer, theatre/television/film.  Call if questions.  Charlane Ferretti., CCC/SLP Admission Coordinator  [facility] Inpatient Rehabilitation  Cell [phone]

## 2017-12-11 NOTE — Progress Notes (Signed)
Patient wants his partner to be agent and wants to complete HCPOA.  Chaplain will assist patient with completion of document.    12/11/17 1343  Clinical Encounter Type  Visited With Patient  Visit Type Initial;Spiritual support  Spiritual Encounters  Spiritual Needs Literature (Advanced Directive)

## 2017-12-11 NOTE — Progress Notes (Signed)
PT Cancellation Note  Patient Details Name: Wesley Burns MRN: [id number] DOB: [birth year]   Cancelled Treatment:    Reason Eval/Treat Not Completed: Patient at procedure or test/unavailable(HD, attempt again as schedule allows)  Drucilla Chalet, PT Pager: [phone] 12/11/2017   [facility] 12/11/2017, 8:57 AM

## 2017-12-11 NOTE — Progress Notes (Signed)
*  Preliminary Results* Carotid artery duplex has been completed. Bilateral internal carotid arteries are near-normal with only minimal wall thickening or plaque. The left vertebral artery is patent with antegrade flow. Unable to visualize the right vertebral artery.  12/11/2017 6:34 PM  Gertie Fey, BS, RVT, RDCS, RDMS

## 2017-12-11 NOTE — Progress Notes (Signed)
OT Cancellation Note  Patient Details Name: Wesley Burns MRN: [id number] DOB: September 28, 1966   Cancelled Treatment:    Reason Eval/Treat Not Completed: Patient at procedure or test/ unavailable..  Pt in HE.  Will reattempt.  Billie Trager [place], OTR/L [phone]   Jeani Hawking M 12/11/2017, 11:04 AM

## 2017-12-11 NOTE — Procedures (Signed)
Seen on HD. 3L UF goal, GFR 400. No major complaints at this time.  RRR; CTAB. No edema. Condom cath in place with decent UOP.  Dialysis Orders: TTS at [facility] 4hr,400/800, EDW 65kg, 2K/2Ca bath, AVF, heparin 2000 bolus - Mircera IV q 2 weeks (last 1/3) - Calcitriol 0.57mcg PO q HD  Assessment/Plan: 1. R Thalamic Hemorrhagic Stroke (w/ L sided deficits): Per neuro. 2. ESRD:Continue TTS schedule, HD today. NO HEPARIN WITH HD. 3. Hypertension/volume:BP high, UF as tolerated. PO meds. 6kg above EDW if bed weights right (?) 4. Anemia:Hgb 8.7. Not due for ESA again. 5. Metabolic bone disease:Corr Ca/Phos ok. Follow. Resuem VDRA. 6. Type 2 DM: Per primary.  Wesley Hoyle, PA-C [employer] Pager [phone]  Pt seen, examined and agree w A/P as above.  Vinson Moselle MD [employer] pager [phone]   12/11/2017, 9:19 AM

## 2017-12-11 NOTE — Clinical Social Work Note (Signed)
Clinical Social Work Assessment  Patient Details  Name: Wesley Burns MRN: [id number] Date of Birth: [birth year]  Date of referral:  12/11/17               Reason for consult:  Facility Placement                Permission sought to share information with:  Facility Medical sales representative, Family Supports Permission granted to share information::  Yes, Verbal Permission Granted  Name::     Academic librarian::  SNF  Relationship::  Psychologist, educational Information:     Housing/Transportation Living arrangements for the past 2 months:  Single Family Home Source of Information:  Medical Team Patient Interpreter Needed:  None Criminal Activity/Legal Involvement Pertinent to Current Situation/Hospitalization:  No - Comment as needed Significant Relationships:  Significant Other, Other Family Members Lives with:  Self, Significant Other Do you feel safe going back to the place where you live?  Yes Need for family participation in patient care:  No (Coment)  Care giving concerns:  Patient lives at home with partner, but will need rehab at discharge prior to returning home. Patient's spouse is unable to provide care for patient at discharge.   Social Worker assessment / plan:  CSW reviewed patient case and faxed out referral for SNF placement, due to CIR not being an option for patient. CSW discussed case with CSW AD to review barriers to discharge (no insurance, needing therapy to recover from stroke, needing outpatient dialysis). CSW confirmed with financial counseling that Medicaid application is pending for the patient as of 12/10/17. CSW discussed with Medical Director about barriers to discharge. CSW will continue to follow.  Employment status:    Insurance information:  Self Pay (Medicaid Pending) PT Recommendations:  Inpatient Rehab Consult Information / Referral to community resources:  Skilled Nursing Facility  Patient/Family's Response to care:  Patient is agreeable to SNF  placement.  Patient/Family's Understanding of and Emotional Response to Diagnosis, Current Treatment, and Prognosis:  Patient and partner are aware that patient will need significant assistance at discharge, and are agreeable to receiving whatever services could be available for the patient to recover. Patient currently has no bed offers for SNF placement at this time, due to not having any insurance to pay for nursing care, therapies, or dialysis. If patient will need to be placed outside the county, patient may or may not be able to get clipped anywhere else due to not having insurance.  Emotional Assessment Appearance:  Appears stated age Attitude/Demeanor/Rapport:  Engaged Affect (typically observed):  Appropriate Orientation:  Oriented to Self, Oriented to Place, Oriented to  Time, Oriented to Situation Alcohol / Substance use:  Not Applicable Psych involvement (Current and /or in the community):  No (Comment)  Discharge Needs  Concerns to be addressed:  Care Coordination Readmission within the last 30 days:  No Current discharge risk:  Physical Impairment, Dependent with Mobility, Inadequate Financial Supports Barriers to Discharge:  Continued Medical Work up, Inadequate or no insurance, Waiting for outpatient dialysis   Baldemar Lenis, LCSW 12/11/2017, 5:56 PM

## 2017-12-11 NOTE — Progress Notes (Signed)
NEUROHOSPITALISTS - STROKE TEAM DAILY PROGRESS NOTE  ADMISSION HISTORY Wesley Burns is an 52 y.o. male PMH of ESRD on Dialysis, HTN, Hx of CVA without deficits, BPH and Depression that presents to the ED as a code stroke with reports of acute onset Left sided weakness. EMS reports that patient fell out of his bed at approximately 15:40. Patients room mate states that he last saw him normal at 07:00 this morning.   Room mate states that patient went back to bed at approximately 07:00 and he heard him fall out of bed at 15:40 today. On admission he has Left sided weakness, Left facial droop, decreased sensation on the Left, slightly dysarthric speech. CT Head reveals:Acute right thalamic hemorrhage and New age indeterminate left thalamic lacunar infarct  Date last known well: Date: 12/07/2017 Time last known well: Time: 07:00 tPA Given: No: hemorrhage Modified Rankin: Rankin Score=3 NIHSS: 12 Intracerebral Hemorrhage (ICH) Score Total: 1    SUBJECTIVE No family at bedside.  Patient found lying in bed in no acute distress in Dialysis unit. No complaints of leg spasms today. No reports of diarrhea noted. Awaiting bed offer at SNF for discharge  Physical Examination: Vitals:   12/11/17 1100 12/11/17 1122 12/11/17 1125 12/11/17 1222  BP: (!) 166/82 (!) 141/73 (!) 153/71 (!) 170/85  Pulse: 76 83 73 78  Resp: 15 15 16 17   Temp:  98.3 F (36.8 C)  97.6 F (36.4 C)  TempSrc:  Oral  Axillary  SpO2:  99%  96%  Weight:  68 kg (149 lb 14.6 oz)    Height:       HEENT-  Normocephalic, Normal external eye/conjunctiva.  Normal external ears. Normal external nose, mucus membranes and septum.   Cardiovascular - Regular rate and rhythm  Respiratory - Lungs clear bilaterally. Non-labored breathing, No wheezing. Abdomen - soft and non-tender, BS normal Extremities- no edema or cyanosis Skin-warm and dry  Neurological Examination Mental Status: Awake,oriented x 3  Speech is  slightly dysarthric. Able to follow 3 step commands without difficulty. Cranial Nerves: II: Left field cut. Pupils are equal, round, and reactive to light.   III,IV, VI: EOMI without ptosis or diploplia. No nystagmus.   V: Facial sensation is decreased on the Left VII: Facial movement is decreased on the Left VIII: hearing is intact to voice X: Uvula elevates symmetrically XI: Shoulder shrug is decreased on the left XII: tongue is midline without atrophy or fasciculations.  Motor: Tone is normal. Bulk is normal. 5/5 strength on the Right.  Dense left hemiplegia and Strength is 1/5 on the Left. Arm and 0/5 Left leg  Sensor: Sensation is decreased on the Left in the arms and legs. Normal on the Right Deep Tendon Reflexes: 1+ and symmetric throughout in the biceps and patellae Plantars: Toes are downgoing bilaterally.  Cerebellar: unable to perform finger-to-nose on Left Gait: Truncal ataxia to Left   Lab Results: CBC: Recent Labs  Lab 12/07/17 1619 12/07/17 1632 12/09/17 0455 12/11/17 0706  WBC 6.9  --  7.2 10.1  HGB 10.4* 11.2* 8.4* 8.7*  HCT 31.9* 33.0* 25.1* 27.3*  MCV 86.7  --  89.6 91.0  PLT 394  --  362 607   Basic Metabolic Panel: Recent Labs  Lab 12/07/17 1619 12/07/17 1632 12/09/17 0455 12/11/17 0706  NA 127* 128* 129* 130*  K 3.4* 3.6 4.4 5.0  CL 91* 93* 95* 96*  CO2 22  --  16* 25  GLUCOSE 97 102* 122* 76  BUN  24* 26* 32* 18  CREATININE 9.59* 10.30* 11.01* 7.65*  CALCIUM 8.2*  --  7.4* 8.5*  MG  --   --  1.7  --   PHOS  --   --  3.3 3.8   Liver Function Tests: Recent Labs  Lab 12/07/17 1619 12/09/17 0455 12/11/17 0706  AST 18 15  --   ALT 17 12*  --   ALKPHOS 345* 242*  --   BILITOT 1.2 0.8  --   PROT 6.7 5.5*  --   ALBUMIN 3.4* 2.8* 2.7*   Coagulation Studies: No results for input(s): APTT, INR in the last 72 hours. Imaging: Ct Head Code Stroke Wo Contrast Result Date: 12/07/2017 IMPRESSION: 1. Acute right thalamic hemorrhage. Mild edema and  local mass effect. 2. 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 3. New age indeterminate left thalamic lacunar infarct. Critical Value/emergent results were called by telephone at the time of interpretation on 12/07/2017 at 4:45 pm to Dr. Lorraine Lax, who verbally acknowledged these results. Electronically Signed   By: Logan Bores M.D.   On: 12/07/2017 16:50   Ct Angio Head W Or Wo Contrast Result Date: 12/07/2017 IMPRESSION: 1. No aneurysm, vascular malformation, or spot sign associated with the right thalamic hemorrhage. 2. Patent circle of Willis with intracranial atherosclerosis as above. Moderate to severe right PCA stenoses.   Ct Head Wo Contrast Result Date: 12/08/2017 IMPRESSION: 1. Stable right thalamic hematoma and small volume of hemorrhage in right lateral ventricle. 2. Mild increase of mass effect with 5 mm right-to-left midline shift, previously 4 mm. 3. No new acute intracranial abnormality identified.    IMPRESSION: [name] is a 52 y.o. male with PMH of ESRD on Dialysis, HTN, Hx of CVA without deficits, BPH and Depression that presents to the ED as a code stroke with reports of acute onset Left sided weakness. EMS reports that patient fell out of his bed at approximately 15:40. Patients room mate states that he last saw him normal at 07:00 this morning.   Room mate states that patient went back to bed at approximately 07:00 and he heard him fall out of bed at 15:40 today. On admission he has Left sided weakness, Left facial droop, decreased sensation on the Left, slightly dysarthric speech. CT Head reveals:  Acute right thalamic hemorrhage with mild cytotoxic edema New age indeterminate left thalamic lacunar infarct  Suspected Etiology: HTN hemorrhage and small vessel disease Resultant Symptoms: Left sided weakness, Left facial droop, decreased sensation on Left Stroke Risk Factors: hypertension, ESRD on Dialysis Other Stroke  Risk Factors: Advanced age, ?ETOH use, Hx stroke  Outstanding Stroke Work-up Studies:    ECHO Carotid Duplex                                                     ASSESSMENT 12/11/2017: Neuro exam remained stable. Some movement in L arm today, none in Leg. Restarted home Clonidine today. Dialysis completed today.  We will continue to hold aspirin for now due to CT results on 1/7 with midline shift. Awaiting bed offer for SNF admission.  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 Restarted home B/P medication Norvasc 12/08/2017, Clonidine restarted 12/11/2017 Home Meds: Norvasc, Clonidine  HYPERLIPIDEMIA: Home Meds:  NONE LDL  goal < 70, LDL 73 Will start Lipitor 10 mg Continue statin at discharge  DIABETES: HgbA1c goal < 7.0, HgbA1C = 4.0 Currently on:  Novolog Continue CBG monitoring and SSI to  maintain glucose 140-180 mg/dl DM education   Other Active Problems: Active Problems:   IVH (intraventricular hemorrhage) (HCC)   Dysphagia, post-stroke   Type 2 diabetes mellitus with peripheral neuropathy (HCC)   History of tobacco abuse   Benign essential HTN   ESRD on dialysis ([place])   History of traumatic brain injury   History of CVA with residual deficit   History of transmetatarsal amputation of foot [facility])    Hospital day # 4 VTE prophylaxis: SCD's  Diet - Diet regular Room service appropriate? Yes; Fluid consistency: Thin   FAMILY UPDATES: No family at bedside  TEAM UPDATES:Sethi, Lucy [place], MD   Prior Home Stroke Medications:  No antithrombotic  Discharge Stroke Meds:  Please discharge patient on TBD , will likely add ASA prior to discharge  Disposition: 01-Home or Self Care Therapy Recs:               SNF Home Equipment:         TBD Follow Up:  Follow-up Information    Garvin Fila, MD. Schedule an appointment as soon as possible for a visit in 6 week(s).   Specialties:  Neurology, Radiology Contact information: [address] [place] [address] Ignacio, [place], MD -PCP Follow up in 1-2 weeks         Mary Sella, ANP-C Neurology Stroke Team 12/11/2017 1:25 PM I have personally examined this patient, reviewed notes, independently viewed imaging studies, participated in medical decision making and plan of care.ROS completed by me personally and pertinent positives fully documented  I have made any additions or clarifications directly to the above note. Agree with note above.    Antony Contras, MD Medical Director [facility] Pager: [phone] 12/11/2017 2:15 PM  Please page stroke NP  Or  PA  Or MD from 8am -4 pm  as this patient from this time will be  followed by the stroke.   You can look them up on [email or web address]  Password [email or web address]

## 2017-12-12 ENCOUNTER — Inpatient Hospital Stay (HOSPITAL_COMMUNITY): Payer: Self-pay

## 2017-12-12 DIAGNOSIS — I340 Nonrheumatic mitral (valve) insufficiency: Secondary | ICD-10-CM

## 2017-12-12 LAB — GLUCOSE, CAPILLARY
GLUCOSE-CAPILLARY: 120 mg/dL — AB (ref 65–99)
GLUCOSE-CAPILLARY: 104 mg/dL — AB (ref 65–99)
Glucose-Capillary: 86 mg/dL (ref 65–99)
Glucose-Capillary: 113 mg/dL — ABNORMAL HIGH (ref 65–99)

## 2017-12-12 LAB — ECHOCARDIOGRAM COMPLETE
Height: 71 in
Weight: 2398.6 oz

## 2017-12-12 MED ORDER — CLONIDINE HCL 0.1 MG PO TABS
0.2000 mg | ORAL_TABLET | Freq: Two times a day (BID) | ORAL | Status: DC
  Administered 2017-12-12 – 2017-12-13 (×2): 0.2 mg via ORAL
  Filled 2017-12-12 (×3): qty 2

## 2017-12-12 NOTE — Progress Notes (Signed)
Occupational Therapy Treatment Patient Details Name: Wesley Burns MRN: [id number] DOB: 1966/09/02 Today's Date: 12/12/2017    History of present illness 52 y.o. male with ESRD, Type 2 DM, HTN, GERD, and Hx of Right basal ganglia CVA (2017), right transmet amputation. Admitted with right thalamic hemorrhagic stroke.   OT comments  Pt much brighter and more interactive today, but reports fatigue due to not sleeping last night, and requests we not attempt EOB activity.  Worked on facilitation of Lt UE - no active movement noted.  Scapular mobilization performed as well as PROM.  Instructed pt on safe positioning of Lt. UE.   Follow Up Recommendations  SNF    Equipment Recommendations  None recommended by OT    Recommendations for Other Services      Precautions / Restrictions Precautions Precautions: Fall Precaution Comments: left foot ulcer       Mobility Bed Mobility                  Transfers                      Balance Overall balance assessment: Needs assistance Sitting-balance support: Single extremity supported;Feet supported Sitting balance-Leahy Scale: Poor Sitting balance - Comments: Pt able to self-correct during sitting balance to sit with trunk upright, however requires VCs and tactile cues most of the time. Pt unable to maintain sitting EOB without at least min guard from PT. Pt with slight lateral trunk lean to left and rotated trunk towards left side.                                    ADL either performed or assessed with clinical judgement   ADL                                               Vision       Perception     Praxis      Cognition Arousal/Alertness: Awake/alert Behavior During Therapy: Flat affect;WFL for tasks assessed/performed Overall Cognitive Status: Impaired/Different from baseline Area of Impairment: Attention;Following commands;Awareness;Problem solving                   Current Attention Level: Selective   Following Commands: Follows multi-step commands inconsistently Safety/Judgement: Decreased awareness of safety;Decreased awareness of deficits Awareness: Intellectual Problem Solving: Slow processing;Difficulty sequencing;Requires verbal cues;Requires tactile cues          Exercises Other Exercises Other Exercises: Pt fatigued so worked at bed level.  Worked on faciltation of Lt UE, however, no active movement noted.  PROM performed as well as scapular moblization as Scap abducted and downwardly rotated.  Instructed pt on safe positioning of Lt UE, and to instruct caregivers how to position him and avoid pulling on Lt UE to prevent injury  Other Exercises: sitting balance with VCs and TCs as well as mirroring method for postural corrections ~ 5 minutes Other Exercises: scapular retraction x5 (noticed right side with more activation than left)   Shoulder Instructions       General Comments Pt's partner entered room at end of session.    Pertinent Vitals/ Pain       Pain Assessment: No/denies pain  Home Living  Prior Functioning/Environment              Frequency  Min 2X/week        Progress Toward Goals  OT Goals(current goals can now be found in the care plan section)  Progress towards OT goals: Progressing toward goals     Plan Discharge plan needs to be updated    Co-evaluation                 AM-PAC PT "6 Clicks" Daily Activity     Outcome Measure   Help from another person eating meals?: Total Help from another person taking care of personal grooming?: A Lot Help from another person toileting, which includes using toliet, bedpan, or urinal?: Total Help from another person bathing (including washing, rinsing, drying)?: Total Help from another person to put on and taking off regular upper body clothing?: Total Help from another person to put on and  taking off regular lower body clothing?: Total 6 Click Score: 7    End of Session    OT Visit Diagnosis: Unsteadiness on feet (R26.81);Muscle weakness (generalized) (M62.81);Hemiplegia and hemiparesis Hemiplegia - Right/Left: Left Hemiplegia - dominant/non-dominant: Non-Dominant Hemiplegia - caused by: Cerebral infarction   Activity Tolerance     Patient Left     Nurse Communication          Time: 3790-2409 OT Time Calculation (min): 9 min  Charges: OT General Charges $OT Visit: 1 Visit OT Treatments $Neuromuscular Re-education: 8-22 mins  Jeani Hawking, OTR/L [phone]    Jeani Hawking M 12/12/2017, 4:37 PM

## 2017-12-12 NOTE — Progress Notes (Signed)
Physical Therapy Treatment Patient Details Name: Wesley Burns MRN: [id number] DOB: [birth year] Today's Date: 12/12/2017    History of Present Illness 52 y.o. male with ESRD, Type 2 DM, HTN, GERD, and Hx of Right basal ganglia CVA (2017), right transmet amputation. Admitted with right thalamic hemorrhagic stroke.    PT Comments    Pt progresses towards goals today, demonstrating sitting EOB with min guard support. Pt is able to self-correct posture with VCs, TCs, and mirroring technique. Pt tolerates trunk activation exercises of reaching outside base of support with RUE in order to elicit muscle activation of left trunk flexors and right trunk extensors. Pt c/o fatigue towards end of session. Updating discharge plan to SNF, as pt without level of assist he will require upon discharge. PT will follow acutely in order to maximize mobility while in hospital setting.     Follow Up Recommendations  Supervision/Assistance - 24 hour;SNF     Equipment Recommendations  Other (comment)(TBD at next venue of care)    Recommendations for Other Services       Precautions / Restrictions Precautions Precautions: Fall Precaution Comments: left foot ulcer Restrictions Weight Bearing Restrictions: No    Mobility  Bed Mobility Overal bed mobility: Needs Assistance Bed Mobility: Supine to Sit     Supine to sit: Mod assist;+2 for physical assistance;HOB elevated Sit to supine: +2 for physical assistance;Mod assist;HOB elevated   General bed mobility comments: modAx2 for LLE managment off of bed and trunk to upright. VCs for bedrail assist with R UE, and use of core muscles to sit up and steady in sitting.  Transfers                    Ambulation/Gait                 Stairs            Wheelchair Mobility    Modified Rankin (Stroke Patients Only) Modified Rankin (Stroke Patients Only) Pre-Morbid Rankin Score: No symptoms Modified Rankin: Severe disability      Balance Overall balance assessment: Needs assistance Sitting-balance support: Single extremity supported;Feet supported Sitting balance-Leahy Scale: Poor Sitting balance - Comments: Pt able to self-correct during sitting balance to sit with trunk upright, however requires VCs and tactile cues most of the time. Pt unable to maintain sitting EOB without at least min guard from PT. Pt with slight lateral trunk lean to left and rotated trunk towards left side.                                     Cognition Arousal/Alertness: Awake/alert Behavior During Therapy: Flat affect;WFL for tasks assessed/performed Overall Cognitive Status: Impaired/Different from baseline Area of Impairment: Awareness;Following commands;Problem solving                       Following Commands: Follows multi-step commands inconsistently   Awareness: Emergent Problem Solving: Difficulty sequencing;Requires verbal cues;Requires tactile cues General Comments: Pt able to tell me that he is in the hospital 2/2 stroke. Pt states that he "cannot move my left arm at all... and my left leg either". Pt reports that the sensation in his left leg has improved. Pt at times self-correcting, however requires VCs and TCs most of the time to center self at EOB with upright posture.       Exercises Other Exercises Other Exercises: reaching outside BOS while  seated EOB with RUE (eliciting left trunk activation with right forward and upward reaching) x5 Other Exercises: sitting balance with VCs and TCs as well as mirroring method for postural corrections ~ 5 minutes Other Exercises: scapular retraction x5 (noticed right side with more activation than left)    General Comments General comments (skin integrity, edema, etc.): Pt's partner entered room at end of session.      Pertinent Vitals/Pain Pain Assessment: No/denies pain    Home Living                      Prior Function            PT  Goals (current goals can now be found in the care plan section) Progress towards PT goals: Progressing toward goals    Frequency    Min 3X/week      PT Plan Discharge plan needs to be updated    Co-evaluation              AM-PAC PT "6 Clicks" Daily Activity  Outcome Measure  Difficulty turning over in bed (including adjusting bedclothes, sheets and blankets)?: Unable Difficulty moving from lying on back to sitting on the side of the bed? : Unable Difficulty sitting down on and standing up from a chair with arms (e.g., wheelchair, bedside commode, etc,.)?: Unable Help needed moving to and from a bed to chair (including a wheelchair)?: Total Help needed walking in hospital room?: Total Help needed climbing 3-5 steps with a railing? : Total 6 Click Score: 6    End of Session   Activity Tolerance: Patient limited by fatigue Patient left: in bed;with call bell/phone within reach;with bed alarm set;with family/visitor present   PT Visit Diagnosis: Unsteadiness on feet (R26.81);Other abnormalities of gait and mobility (R26.89);Hemiplegia and hemiparesis Hemiplegia - Right/Left: Left Hemiplegia - dominant/non-dominant: Non-dominant Hemiplegia - caused by: Other Nontraumatic intracranial hemorrhage     Time: 5732-2025 PT Time Calculation (min) (ACUTE ONLY): 27 min  Charges:  $Therapeutic Activity: 8-22 mins $Neuromuscular Re-education: 8-22 mins                    G Codes:       Reina Fuse, SPT   Reina Fuse 12/12/2017, 1:46 PM

## 2017-12-12 NOTE — Progress Notes (Signed)
  Echocardiogram 2D Echocardiogram has been performed.  Wesley Burns 12/12/2017, 9:56 AM

## 2017-12-12 NOTE — Progress Notes (Signed)
NEUROHOSPITALISTS - STROKE TEAM DAILY PROGRESS NOTE  ADMISSION HISTORY Wesley Burns is an 52 y.o. male PMH of ESRD on Dialysis, HTN, Hx of CVA without deficits, BPH and Depression that presents to the ED as a code stroke with reports of acute onset Left sided weakness. EMS reports that patient fell out of his bed at approximately 15:40. Patients room mate states that he last saw him normal at 07:00 this morning.   Room mate states that patient went back to bed at approximately 07:00 and he heard him fall out of bed at 15:40 today. On admission he has Left sided weakness, Left facial droop, decreased sensation on the Left, slightly dysarthric speech. CT Head reveals:Acute right thalamic hemorrhage and New age indeterminate left thalamic lacunar infarct  Date last known well: Date: 12/07/2017 Time last known well: Time: 07:00 tPA Given: No: hemorrhage Modified Rankin: Rankin Score=3 NIHSS: 12 Intracerebral Hemorrhage (ICH) Score Total: 1    SUBJECTIVE His friend is  at bedside.  Patient found lying in bed in no acute distress in Dialysis unit. No complaints of leg spasms today. No reports of diarrhea noted. Awaiting bed offer at SNF for discharge  Physical Examination: Vitals:   12/11/17 2140 12/12/17 0600 12/12/17 0956 12/12/17 1411  BP: (!) 160/65 (!) 162/77 (!) 154/68 (P) 140/67  Pulse: 81 64 72 (P) 71  Resp: _0 (P) 18  Temp: 98 F (36.7 C) 98.5 F (36.9 C) 98.3 F (36.8 C) (P) 97.7 F (36.5 C)  TempSrc: Oral Oral Oral (P) Oral  SpO2: 96% 95% 96% (P) 96%  Weight:      Height:       HEENT-  Normocephalic, Normal external eye/conjunctiva.  Normal external ears. Normal external nose, mucus membranes and septum.   Cardiovascular - Regular rate and rhythm  Respiratory - Lungs clear bilaterally. Non-labored breathing, No wheezing. Abdomen - soft and non-tender, BS normal Extremities- no edema or cyanosis Skin-warm and dry  Neurological  Examination Mental Status: Awake,oriented x 3  Speech is slightly dysarthric. Able to follow 3 step commands without difficulty. Cranial Nerves: II: Left field cut. Pupils are equal, round, and reactive to light.   III,IV, VI: EOMI without ptosis or diploplia. No nystagmus.   V: Facial sensation is decreased on the Left VII: Facial movement is decreased on the Left VIII: hearing is intact to voice X: Uvula elevates symmetrically XI: Shoulder shrug is decreased on the left XII: tongue is midline without atrophy or fasciculations.  Motor: Tone is normal. Bulk is normal. 5/5 strength on the Right.  Dense left hemiplegia and Strength is 1/5 on the Left. Arm and 0/5 Left leg  Sensor: Sensation is decreased on the Left in the arms and legs. Normal on the Right Deep Tendon Reflexes: 1+ and symmetric throughout in the biceps and patellae Plantars: Toes are downgoing bilaterally.  Cerebellar: unable to perform finger-to-nose on Left Gait: Truncal ataxia to Left   Lab Results: CBC: Recent Labs  Lab 12/07/17 1619 12/07/17 1632 12/09/17 0455 12/11/17 0706  WBC 6.9  --  7.2 10.1  HGB 10.4* 11.2* 8.4* 8.7*  HCT 31.9* 33.0* 25.1* 27.3*  MCV 86.7  --  89.6 91.0  PLT 394  --  362 409   Basic Metabolic Panel: Recent Labs  Lab 12/07/17 1619 12/07/17 1632 12/09/17 0455 12/11/17 0706  NA 127* 128* 129* 130*  K 3.4* 3.6 4.4 5.0  CL 91* 93* 95* 96*  CO2 22  --  16* 25  GLUCOSE 97 102* 122* 76  BUN 24* 26* 32* 18  CREATININE 9.59* 10.30* 11.01* 7.65*  CALCIUM 8.2*  --  7.4* 8.5*  MG  --   --  1.7  --   PHOS  --   --  3.3 3.8   Liver Function Tests: Recent Labs  Lab 12/07/17 1619 12/09/17 0455 12/11/17 0706  AST 18 15  --   ALT 17 12*  --   ALKPHOS 345* 242*  --   BILITOT 1.2 0.8  --   PROT 6.7 5.5*  --   ALBUMIN 3.4* 2.8* 2.7*   Coagulation Studies: No results for input(s): APTT, INR in the last 72 hours. Imaging: Ct Head Code Stroke Wo Contrast Result Date:  12/07/2017 IMPRESSION: 1. Acute right thalamic hemorrhage. Mild edema and local mass effect. 2. 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 3. New age indeterminate left thalamic lacunar infarct. Critical Value/emergent results were called by telephone at the time of interpretation on 12/07/2017 at 4:45 pm to Dr. Lorraine Lax, who verbally acknowledged these results. Electronically Signed   By: Logan Bores M.D.   On: 12/07/2017 16:50   Ct Angio Head W Or Wo Contrast Result Date: 12/07/2017 IMPRESSION: 1. No aneurysm, vascular malformation, or spot sign associated with the right thalamic hemorrhage. 2. Patent circle of Willis with intracranial atherosclerosis as above. Moderate to severe right PCA stenoses.   Ct Head Wo Contrast Result Date: 12/08/2017 IMPRESSION: 1. Stable right thalamic hematoma and small volume of hemorrhage in right lateral ventricle. 2. Mild increase of mass effect with 5 mm right-to-left midline shift, previously 4 mm. 3. No new acute intracranial abnormality identified.   ECHO  12/12/2017 Study Conclusions - Left ventricle: The cavity size was normal. There was moderate   concentric hypertrophy. Systolic function was normal. The   estimated ejection fraction was in the range of 55% to 60%. Wall   motion was normal; there were no regional wall motion   abnormalities. There was a reduced contribution of atrial   contraction to ventricular filling, due to increased ventricular   diastolic pressure or atrial contractile dysfunction. Doppler   parameters are consistent with a reversible restrictive pattern,   indicative of decreased left ventricular diastolic compliance   and/or increased left atrial pressure (grade 3 diastolic   dysfunction). Doppler parameters are consistent with high   ventricular filling pressure. - Aortic valve: Trileaflet; mildly thickened, mildly calcified   leaflets. Moderate focal calcification involving the left    coronary cusp. - Aorta: Ascending aorta diameter: 40 mm (ED). - Ascending aorta: The ascending aorta was mildly dilated. - Mitral valve: Calcified annulus. There was mild regurgitation. - Left atrium: The atrium was mildly dilated. - Right ventricle: The cavity size was mildly dilated. Wall   thickness was normal. - Tricuspid valve: There was trivial regurgitation  Carotid Duplex           12/11/2017 Final Interpretation: Right Carotid:  The extracranial vessels were near-normal with only minimal wall thickening or plaque. Left Carotid: The extracranial vessels were near-normal with only minimal wall thickening or plaque. Vertebrals: Left vertebral artery was patent with antegrade flow.  Right vertebral artery was not visualized. Subclavians: Normal flow hemodynamics were seen in the right subclavian artery.  Left subclavian artery exhibits monophasic flow, suggestive of possbile proximal obstruction    IMPRESSION: [name] is a 52 y.o. male with PMH of ESRD on Dialysis, HTN, Hx of CVA without deficits,  BPH and Depression that presents to the ED as a code stroke with reports of acute onset Left sided weakness. EMS reports that patient fell out of his bed at approximately 15:40. Patients room mate states that he last saw him normal at 07:00 this morning.   Room mate states that patient went back to bed at approximately 07:00 and he heard him fall out of bed at 15:40 today. On admission he has Left sided weakness, Left facial droop, decreased sensation on the Left, slightly dysarthric speech. CT Head reveals:  Acute right thalamic hemorrhage with mild cytotoxic edema New age indeterminate left thalamic lacunar infarct  Suspected Etiology: HTN hemorrhage and small vessel disease Resultant Symptoms: Left sided weakness, Left facial droop, decreased sensation on Left Stroke Risk Factors: hypertension, ESRD on Dialysis Other Stroke Risk Factors: Advanced age, ?ETOH use, Hx  stroke  Outstanding Stroke Work-up Studies:   Work up completed at this time                                                     ASSESSMENT 12/12/2017: Neuro exam remained stable. Increased home dose of Clonidine to 0.2 mg today for some sustained elevated B/P. We will continue to hold aspirin for now due to CT results on 1/7 with midline shift. Awaiting bed offer for SNF admission. Room mate at bedside today and POC, labs, imaging reviewed by Dr Leonie Man  PLAN  12/12/2017: Awaiting bed in SNF for discharge Frequent Neurochecks  Telemetry Monitoring Continue to HOLD Aspirin as patient has a right ICH For worsening mentation, obtain STAT Head CT- and contact Neurology immediately PT/OT/SLP Ongoing aggressive stroke risk factor management Patient will be counseled to be compliant with his antithrombotic medications Patient will be counseled on Lifestyle modifications including, Diet, Exercise, and Stress Follow up with [facility] in 6 weeks  HX OF STROKES: 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  DYSPHAGIA: Passes SLP swallow evaluation- Dysphagia 3 diet Aspiration Precautions in progress  MEDICAL ISSUES: ESRD on Dialysis Stable, Nephrology following, Appreciate Assistance  Anemia, chronic Stable, Nephrology following Repeat labs in AM 1/12  Hyponatremia Stable, Nephrology following Repeat labs in AM 1/12  Hypocalcemia Stable, Nephrology following Repeat labs in AM 1/12  Elevated Alk Phos Stable, Nephrology following Repeat labs in AM 1/12  Leg Spasms Zanaflex BID PRN  Diarrhea- resolved No further reports per RN Formed BM 12/12/2017 Imodium PRN  HYPERTENSION: Stable, some sustained elevated B/P over the past 24 hours, Increased Clonidine to 0.2 mg BID SBP goal less than 140/90  Labetolol PRN Long term BP goal normotensive. Restarted home B/P medication Norvasc 12/08/2017,  Clonidine restarted 12/11/2017, increased to 0.2 mg BID on 1/11 Home Meds:  Norvasc, Clonidine  HYPERLIPIDEMIA: Home Meds:  NONE LDL  goal < 70, LDL 73 Will start Lipitor 10 mg Continue statin at discharge  DIABETES: HgbA1c goal < 7.0, HgbA1C = 4.0 Currently on:  Novolog Continue CBG monitoring and SSI to maintain glucose 140-180 mg/dl DM education   Other Active Problems: Active Problems:   IVH (intraventricular hemorrhage) (HCC)   Dysphagia, post-stroke   Type 2 diabetes mellitus with peripheral neuropathy (HCC)   History of tobacco abuse   Benign essential HTN   ESRD on dialysis ([place])   History of traumatic brain injury   History of CVA with residual deficit  History of transmetatarsal amputation of foot [facility])    Hospital day # 5 VTE prophylaxis: SCD's  Diet - Diet regular Room service appropriate? Yes; Fluid consistency: Thin   FAMILY UPDATES: No family at bedside  TEAM UPDATES:Sethi, Lucy [place], MD   Prior Home Stroke Medications:  No antithrombotic  Discharge Stroke Meds:  Please discharge patient on TBD , will likely add ASA prior to discharge  Disposition: 01-Home or Self Care Therapy Recs:               SNF Home Equipment:         TBD Follow Up:  Follow-up Information    Garvin Fila, MD. Schedule an appointment as soon as possible for a visit in 6 week(s).   Specialties:  Neurology, Radiology Contact information: [address] [place] [address] [place], [place], MD -PCP Follow up in 1-2 weeks         Renie Ora Neurology Stroke Team 12/12/2017 4:24 PM   Agree with above Antony Contras, [facility] Pager: [phone] 12/12/2017 4:41 PM Please page stroke NP  Or  PA  Or MD from 8am -4 pm  as this patient from this time will be  followed by the stroke.   You can look them up on [email or web address]  Password [email or web address]

## 2017-12-12 NOTE — Progress Notes (Signed)
[facility] Progress Note  Subjective: no c/o, "biting my cheek and tongue some when eating"  Vitals:   12/11/17 1848 12/11/17 2140 12/12/17 0600 12/12/17 0956  BP: (!) 149/75 (!) 160/65 (!) 162/77 (!) 154/68  Pulse: 70 81 64 72  Resp: 18 18 18 18   Temp: 97.9 F (36.6 C) 98 F (36.7 C) 98.5 F (36.9 C) 98.3 F (36.8 C)  TempSrc: Axillary Oral Oral Oral  SpO2: 96% 96% 95% 96%  Weight:      Height:        Inpatient medications: .  stroke: mapping our early stages of recovery book   Does not apply Once  . amLODipine  10 mg Oral Daily  . atorvastatin  10 mg Oral q1800  . calcitRIOL  0.75 mcg Oral Once per day on Tue Thu Sat  . cloNIDine  0.1 mg Oral BID  . insulin aspart  0-9 Units Subcutaneous TID WC  . pantoprazole  40 mg Oral QHS  . senna-docusate  1 tablet Oral BID  . sevelamer carbonate  800 mg Oral TID WC    acetaminophen **OR** acetaminophen (TYLENOL) oral liquid 160 mg/5 mL **OR** acetaminophen, labetalol, LORazepam, RESOURCE THICKENUP CLEAR, tiZANidine  Exam: General:Well appearing, NAD Heart:RRR; no murmur Lungs:CTA anteriorly Extremities:No LE edema Dialysis Access:AVF + thrill L sided hemiparesis, L facial droop  TTS at [facility] 4hr,400/800, EDW 65kg, 2K/2Ca bath, AVF, heparin 2000 bolus - [facility] IV q 2 weeks (last 1/3) - Calcitriol 0.28mcg PO q HD  Assessment/Plan: 1. R Thalamic Hemorrhagic Stroke (w/ L sided deficits): Per neuro. 2. ESRD:Continue TTS schedule, next 1/10. NO HEPARIN WITH HD. 3. Hypertension/volume:BP high, UF as tolerated. PO meds.  3kg up today 4. Anemia:Hgb 11.2 -> 8.4. Not due for ESA again. 5. Metabolic bone disease:Corr Ca/Phos ok. Follow. Resuem VDRA. 6. Type 2 DM: Per primary.   Plan - HD Sat, max UF to dry wt   Fri MD [facility] pager [phone]   12/12/2017, 12:16 PM   Recent Labs  Lab 12/07/17 1619 12/07/17 1632 12/09/17 0455 12/11/17 0706  NA 127*  128* 129* 130*  K 3.4* 3.6 4.4 5.0  CL 91* 93* 95* 96*  CO2 22  --  16* 25  GLUCOSE 97 102* 122* 76  BUN 24* 26* 32* 18  CREATININE 9.59* 10.30* 11.01* 7.65*  CALCIUM 8.2*  --  7.4* 8.5*  PHOS  --   --  3.3 3.8   Recent Labs  Lab 12/07/17 1619 12/09/17 0455 12/11/17 0706  AST 18 15  --   ALT 17 12*  --   ALKPHOS 345* 242*  --   BILITOT 1.2 0.8  --   PROT 6.7 5.5*  --   ALBUMIN 3.4* 2.8* 2.7*   Recent Labs  Lab 12/07/17 1619 12/07/17 1632 12/09/17 0455 12/11/17 0706  WBC 6.9  --  7.2 10.1  NEUTROABS 3.7  --   --   --   HGB 10.4* 11.2* 8.4* 8.7*  HCT 31.9* 33.0* 25.1* 27.3*  MCV 86.7  --  89.6 91.0  PLT 394  --  362 370   Iron/TIBC/Ferritin/ %Sat    Component Value Date/Time   IRON 7 (L) 07/01/2016 0536   TIBC 179 (L) 07/01/2016 0536   FERRITIN 194 07/02/2016 0544   IRONPCTSAT 4 (L) 07/01/2016 07/03/2016

## 2017-12-12 NOTE — Progress Notes (Signed)
   12/12/17 1300  Clinical Encounter Type  Visited With Patient and family together  Visit Type Follow-up  Referral From Chaplain;Nurse  Consult/Referral To Chaplain  Spiritual Encounters  Spiritual Needs Literature  Chaplain was called to the PT room in order to have an AD completed.  The PT had the packet filled out already and just needed notary and witness participation.

## 2017-12-13 DIAGNOSIS — I615 Nontraumatic intracerebral hemorrhage, intraventricular: Secondary | ICD-10-CM

## 2017-12-13 LAB — GLUCOSE, CAPILLARY
GLUCOSE-CAPILLARY: 125 mg/dL — AB (ref 65–99)
GLUCOSE-CAPILLARY: 105 mg/dL — AB (ref 65–99)
Glucose-Capillary: 89 mg/dL (ref 65–99)

## 2017-12-13 LAB — BASIC METABOLIC PANEL
ANION GAP: 7 (ref 5–15)
BUN: 22 mg/dL — ABNORMAL HIGH (ref 6–20)
CALCIUM: 7.8 mg/dL — AB (ref 8.9–10.3)
CO2: 25 mmol/L (ref 22–32)
CREATININE: 6.12 mg/dL — AB (ref 0.61–1.24)
Chloride: 94 mmol/L — ABNORMAL LOW (ref 101–111)
GFR calc Af Amer: 11 mL/min — ABNORMAL LOW (ref >60)
GFR, EST NON AFRICAN AMERICAN: 9 mL/min — AB (ref >60)
Glucose, Bld: 122 mg/dL — ABNORMAL HIGH (ref 65–99)
Potassium: 5.3 mmol/L — ABNORMAL HIGH (ref 3.5–5.1)
SODIUM: 126 mmol/L — AB (ref 135–145)

## 2017-12-13 LAB — CBC
HCT: 27.9 % — ABNORMAL LOW (ref 39.0–52.0)
HEMOGLOBIN: 8.8 g/dL — AB (ref 13.0–17.0)
MCH: 28.5 pg (ref 26.0–34.0)
MCHC: 31.5 g/dL (ref 30.0–36.0)
MCV: 90.3 fL (ref 78.0–100.0)
PLATELETS: 294 10*3/uL (ref 150–400)
RBC: 3.09 MIL/uL — AB (ref 4.22–5.81)
RDW: 17.7 % — ABNORMAL HIGH (ref 11.5–15.5)
WBC: 6.6 10*3/uL (ref 4.0–10.5)

## 2017-12-13 LAB — ALKALINE PHOSPHATASE: ALK PHOS: 209 U/L — AB (ref 38–126)

## 2017-12-13 LAB — HEPATITIS B SURFACE ANTIGEN: HEP B S AG: NEGATIVE

## 2017-12-13 MED ORDER — PENTAFLUOROPROP-TETRAFLUOROETH EX AERO: 1.0000 "application " | INHALATION_SPRAY | CUTANEOUS | Status: DC | PRN

## 2017-12-13 MED ORDER — SODIUM CHLORIDE 0.9 % IV SOLN: 100.0000 mL | INTRAVENOUS | Status: DC | PRN

## 2017-12-13 MED ORDER — HEPARIN SODIUM (PORCINE) 1000 UNIT/ML DIALYSIS: 1000.0000 [IU] | INTRAMUSCULAR | Status: DC | PRN

## 2017-12-13 MED ORDER — LIDOCAINE-PRILOCAINE 2.5-2.5 % EX CREA: 1.0000 "application " | TOPICAL_CREAM | CUTANEOUS | Status: DC | PRN

## 2017-12-13 MED ORDER — ALTEPLASE 2 MG IJ SOLR: 2.0000 mg | Freq: Once | INTRAMUSCULAR | Status: DC | PRN

## 2017-12-13 MED ORDER — POLYETHYLENE GLYCOL 3350 17 G PO PACK
17.0000 g | PACK | Freq: Every day | ORAL | Status: DC
  Administered 2017-12-14 – 2017-12-30 (×15): 17 g via ORAL
  Filled 2017-12-13 (×16): qty 1

## 2017-12-13 MED ORDER — LIDOCAINE HCL (PF) 1 % IJ SOLN: 5.0000 mL | INTRAMUSCULAR | Status: DC | PRN

## 2017-12-13 NOTE — Progress Notes (Signed)
[facility] Progress Note  Subjective: no new c/o  Vitals:   12/12/17 2218 12/13/17 0036 12/13/17 0506 12/13/17 1033  BP: (!) 149/72 (!) 149/75 (!) 148/70 (!) 152/75  Pulse: 76 74 (!) 59 64  Resp: 18 18 18 19   Temp: 99.7 F (37.6 C) 98.9 F (37.2 C) 98.1 F (36.7 C) 98.1 F (36.7 C)  TempSrc: Oral Oral Oral Oral  SpO2: 93% 94% 96% 98%  Weight:      Height:        Inpatient medications: .  stroke: mapping our early stages of recovery book   Does not apply Once  . amLODipine  10 mg Oral Daily  . atorvastatin  10 mg Oral q1800  . calcitRIOL  0.75 mcg Oral Once per day on Tue Thu Sat  . cloNIDine  0.2 mg Oral BID  . insulin aspart  0-9 Units Subcutaneous TID WC  . pantoprazole  40 mg Oral QHS  . senna-docusate  1 tablet Oral BID  . sevelamer carbonate  800 mg Oral TID WC    acetaminophen **OR** acetaminophen (TYLENOL) oral liquid 160 mg/5 mL **OR** acetaminophen, labetalol, LORazepam, RESOURCE THICKENUP CLEAR, tiZANidine  Exam: General:Well appearing, NAD Heart:RRR; no murmur Lungs:CTA anteriorly Extremities:No LE edema Dialysis Access:AVF + thrill L sided hemiparesis, L facial droop  TTS at [facility] 4hr,400/800, EDW 65kg, 2K/2Ca bath, AVF, heparin 2000 bolus - [facility] IV q 2 weeks (last 1/3) - Calcitriol 0.33mcg PO q HD  Assessment/Plan: 1. R Thalamic Hemorrhagic Stroke (w/ L sided deficits): Per neuro. 2. ESRD:Continue TTS HD. NO HEPARIN WITH HD. 3. Hypertension/volume:BP stable on norvasc/ clonidine.  3kg up  4. Anemia:Hgb 11.2 -> 8.4. Not due for ESA again. 5. Metabolic bone disease:Corr Ca/Phos ok. Follow. Resuem VDRA. 6. Type 2 DM: Per primary. 7. Dispo - awaiting SNF placement   Plan - HD today, UF to dry wt   70m MD [facility] pager [phone]   12/13/2017, 11:24 AM   Recent Labs  Lab 12/09/17 0455 12/11/17 0706 12/13/17 0300  NA 129* 130* 126*  K 4.4 5.0 5.3*  CL 95* 96* 94*  CO2  16* 25 25  GLUCOSE 122* 76 122*  BUN 32* 18 22*  CREATININE 11.01* 7.65* 6.12*  CALCIUM 7.4* 8.5* 7.8*  PHOS 3.3 3.8  --    Recent Labs  Lab 12/07/17 1619 12/09/17 0455 12/11/17 0706 12/13/17 0300  AST 18 15  --   --   ALT 17 12*  --   --   ALKPHOS 345* 242*  --  209*  BILITOT 1.2 0.8  --   --   PROT 6.7 5.5*  --   --   ALBUMIN 3.4* 2.8* 2.7*  --    Recent Labs  Lab 12/07/17 1619  12/09/17 0455 12/11/17 0706 12/13/17 0300  WBC 6.9  --  7.2 10.1 6.6  NEUTROABS 3.7  --   --   --   --   HGB 10.4*   < > 8.4* 8.7* 8.8*  HCT 31.9*   < > 25.1* 27.3* 27.9*  MCV 86.7  --  89.6 91.0 90.3  PLT 394  --  362 370 294   < > = values in this interval not displayed.   Iron/TIBC/Ferritin/ %Sat    Component Value Date/Time   IRON 7 (L) 07/01/2016 0536   TIBC 179 (L) 07/01/2016 0536   FERRITIN 194 07/02/2016 0544   IRONPCTSAT 4 (L) 07/01/2016 07/03/2016

## 2017-12-13 NOTE — Clinical Social Work Note (Signed)
CSW is working on LOG placement where they can receive dialysis. CSW has sent referral to [facility]. Pt would receive dialysis at the [facility]. Called Rhonda with [facility]--awaiting call back.   [place], [place] [phone]

## 2017-12-13 NOTE — Progress Notes (Signed)
NEUROHOSPITALISTS - STROKE TEAM DAILY PROGRESS NOTE   SUBJECTIVE No family members at the bedside.  The patient was sleeping.  He is somewhat lethargic but oriented.  He is able to follow all commands.  He appears somewhat depressed.  Physical Examination: Vitals:   12/12/17 2218 12/13/17 0036 12/13/17 0506 12/13/17 1033  BP: (!) 149/72 (!) 149/75 (!) 148/70 (!) 152/75  Pulse: 76 74 (!) 59 64  Resp: _0 Temp: 99.7 F (37.6 C) 98.9 F (37.2 C) 98.1 F (36.7 C) 98.1 F (36.7 C)  TempSrc: Oral Oral Oral Oral  SpO2: 93% 94% 96% 98%  Weight:      Height:       HEENT-  Normocephalic, Normal external eye/conjunctiva.  Normal external ears. Normal external nose, mucus membranes and septum.   Cardiovascular - Regular rate and rhythm  Respiratory - Lungs clear bilaterally. Non-labored breathing, No wheezing. Abdomen - soft and non-tender, BS normal Extremities- no edema or cyanosis Skin-warm and dry  Neurological Examination Mental Status: Awake,oriented x 3  Speech is slightly dysarthric. Able to follow 3 step commands without difficulty. Cranial Nerves: II: Left field cut. Pupils are equal, round, and reactive to light.   III,IV, VI: EOMI without ptosis or diploplia. No nystagmus.   V: Facial sensation is decreased on the Left VII: Facial movement is decreased on the Left VIII: hearing is intact to voice X: Uvula elevates symmetrically XI: Shoulder shrug is decreased on the left XII: tongue is midline without atrophy or fasciculations.  Motor: Tone is normal. Bulk is normal. 5/5 strength on the Right.  Dense left hemiplegia and Strength is 1/5 on the Left. Arm and 0/5 Left leg  Sensor: Sensation is decreased on the Left in the arms and legs. Normal on the Right Deep Tendon Reflexes: 1+ and symmetric throughout in the biceps and patellae Plantars: Toes are downgoing bilaterally.  Cerebellar: unable to perform finger-to-nose on Left Gait: Truncal  ataxia to Left   Lab Results: CBC: Recent Labs  Lab 12/09/17 0455 12/11/17 0706 12/13/17 0300  WBC 7.2 10.1 6.6  HGB 8.4* 8.7* 8.8*  HCT 25.1* 27.3* 27.9*  MCV 89.6 91.0 90.3  PLT 362 370 836   Basic Metabolic Panel: Recent Labs  Lab 12/07/17 1619 12/07/17 1632 12/09/17 0455 12/11/17 0706 12/13/17 0300  NA 127* 128* 129* 130* 126*  K 3.4* 3.6 4.4 5.0 5.3*  CL 91* 93* 95* 96* 94*  CO2 22  --  16* 25 25  GLUCOSE 97 102* 122* 76 122*  BUN 24* 26* 32* 18 22*  CREATININE 9.59* 10.30* 11.01* 7.65* 6.12*  CALCIUM 8.2*  --  7.4* 8.5* 7.8*  MG  --   --  1.7  --   --   PHOS  --   --  3.3 3.8  --    Liver Function Tests: Recent Labs  Lab 12/07/17 1619 12/09/17 0455 12/11/17 0706 12/13/17 0300  AST 18 15  --   --   ALT 17 12*  --   --   ALKPHOS 345* 242*  --  209*  BILITOT 1.2 0.8  --   --   PROT 6.7 5.5*  --   --   ALBUMIN 3.4* 2.8* 2.7*  --    Coagulation Studies: No results for input(s): APTT, INR in the last 72 hours. Imaging:  Ct Head Code Stroke Wo Contrast Result Date: 12/07/2017 IMPRESSION:  1. Acute right thalamic hemorrhage. Mild edema and local mass effect.  2.  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  3. New age indeterminate left thalamic lacunar infarct.     Ct Angio Head W Or Wo Contrast Result Date: 12/07/2017 IMPRESSION: 1. No aneurysm, vascular malformation, or spot sign associated with the right thalamic hemorrhage. 2. Patent circle of Willis with intracranial atherosclerosis as above.  Moderate to severe right PCA stenoses.   Ct Head Wo Contrast Result Date: 12/08/2017 IMPRESSION:  1. Stable right thalamic hematoma and small volume of hemorrhage in right lateral ventricle.  2. Mild increase of mass effect with 5 mm right-to-left midline shift, previously 4 mm.  3. No new acute intracranial abnormality identified.    ECHO  12/12/2017 Study Conclusions - Left ventricle: The cavity size was  normal. There was moderate   concentric hypertrophy. Systolic function was normal. The   estimated ejection fraction was in the range of 55% to 60%. Wall   motion was normal; there were no regional wall motion   abnormalities. There was a reduced contribution of atrial   contraction to ventricular filling, due to increased ventricular   diastolic pressure or atrial contractile dysfunction. Doppler   parameters are consistent with a reversible restrictive pattern,   indicative of decreased left ventricular diastolic compliance   and/or increased left atrial pressure (grade 3 diastolic   dysfunction). Doppler parameters are consistent with high   ventricular filling pressure. - Aortic valve: Trileaflet; mildly thickened, mildly calcified   leaflets. Moderate focal calcification involving the left   coronary cusp. - Aorta: Ascending aorta diameter: 40 mm (ED). - Ascending aorta: The ascending aorta was mildly dilated. - Mitral valve: Calcified annulus. There was mild regurgitation. - Left atrium: The atrium was mildly dilated. - Right ventricle: The cavity size was mildly dilated. Wall   thickness was normal. - Tricuspid valve: There was trivial regurgitation  Carotid Duplex           12/11/2017 Final Interpretation: Right Carotid:  The extracranial vessels were near-normal with only minimal wall thickening or plaque. Left Carotid: The extracranial vessels were near-normal with only minimal wall thickening or plaque. Vertebrals: Left vertebral artery was patent with antegrade flow.  Right vertebral artery was not visualized. Subclavians: Normal flow hemodynamics were seen in the right subclavian artery.  Left subclavian artery exhibits monophasic flow, suggestive of possbile proximal obstruction     IMPRESSION: [name] is a 52 y.o. male with PMH of ESRD on Dialysis, HTN, Hx of CVA without deficits, BPH and Depression that presents to the ED as a code stroke with reports  of acute onset Left sided weakness. EMS reports that patient fell out of his bed at approximately 15:40. Patients room mate states that he last saw him normal at 07:00 this morning.   Room mate states that patient went back to bed at approximately 07:00 and he heard him fall out of bed at 15:40 today. On admission he has Left sided weakness, Left facial droop, decreased sensation on the Left, slightly dysarthric speech. CT Head reveals:  Acute right thalamic hemorrhage with mild cytotoxic edema New age indeterminate left thalamic lacunar infarct  Suspected Etiology: HTN hemorrhage and small vessel disease Resultant Symptoms: Left sided weakness, Left facial droop, decreased sensation on Left Stroke Risk Factors: hypertension, ESRD on Dialysis Other Stroke Risk Factors: Advanced age, ?ETOH use, Hx stroke  Outstanding Stroke Work-up Studies:   Work up completed at this time  ASSESSMENT 12/12/2017: Neuro exam remained stable. Increased home dose of Clonidine to 0.2 mg today for some sustained elevated B/P. We will continue to hold aspirin for now due to CT results on 1/7 with midline shift. Awaiting bed offer for SNF admission. Room mate at bedside today and POC, labs, imaging reviewed by Dr Leonie Man  PLAN  12/13/2017: Awaiting bed in SNF for discharge Frequent Neurochecks  Telemetry Monitoring Continue to HOLD Aspirin as patient has a right ICH For worsening mentation, obtain STAT Head CT- and contact Neurology immediately PT/OT/SLP Ongoing aggressive stroke risk factor management Patient will be counseled to be compliant with his antithrombotic medications Patient will be counseled on Lifestyle modifications including, Diet, Exercise, and Stress Follow up with [facility] in 6 weeks  HX OF STROKES: 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  DYSPHAGIA: Passes SLP swallow evaluation- Dysphagia 3 diet Aspiration Precautions in  progress  MEDICAL ISSUES: ESRD on Dialysis Stable, Nephrology following, Appreciate Assistance K 5.3 Saturday 12/13/2017 - Na 126  Anemia, chronic Stable, Nephrology following Repeat labs in AM 1/12  Hyponatremia Stable, Nephrology following Repeat labs in AM 1/12  Hypocalcemia Stable, Nephrology following Repeat labs in AM 1/12  Elevated Alk Phos Stable, Nephrology following Repeat labs in AM 1/12  Leg Spasms Zanaflex BID PRN  Diarrhea- resolved No further reports per RN Formed BM 12/12/2017 Imodium PRN  HYPERTENSION: Stable, some sustained elevated B/P, Increased Clonidine to 0.2 mg BID 12/12/17 Friday. (May need TID) SBP goal less than 140/90  Labetolol PRN Long term BP goal normotensive. Restarted home B/P medication Norvasc 12/08/2017,  Clonidine restarted 12/11/2017, increased to 0.2 mg BID on 1/11 Home Meds: Norvasc, Clonidine  HYPERLIPIDEMIA: Home Meds:  NONE LDL  goal < 70, LDL 73 Will start Lipitor 10 mg Continue statin at discharge  DIABETES: HgbA1c goal < 7.0, HgbA1C = 4.0 Currently on:  Novolog Continue CBG monitoring and SSI to maintain glucose 140-180 mg/dl DM education   Other Active Problems: Active Problems:   IVH (intraventricular hemorrhage) (HCC)   Dysphagia, post-stroke   Type 2 diabetes mellitus with peripheral neuropathy (HCC)   History of tobacco abuse   Benign essential HTN   ESRD on dialysis ([place])   History of traumatic brain injury   History of CVA with residual deficit   History of transmetatarsal amputation of foot [facility])    Hospital day # 6 VTE prophylaxis: SCD's  Diet - Diet regular Room service appropriate? Yes; Fluid consistency: Thin   FAMILY UPDATES: No family at bedside  TEAM UPDATES:De Libman, MD   Prior Home Stroke Medications:  No antithrombotic  Discharge Stroke Meds:  Please discharge patient on TBD , will likely add ASA prior to discharge  Disposition: 01-Home or Self Care Therapy Recs:                SNF - I spoke with the patient's social worker today, 12/13/2017, she informed me that because the patient needs dialysis he will need to go to a nursing facility in the  [facility] system. She is currently working on a bed; however, there are none available at this time.  Home Equipment:         TBD Follow Up:  Follow-up Information    Garvin Fila, MD. Schedule an appointment as soon as possible for a visit in 6 week(s).   Specialties:  Neurology, Radiology Contact information: [address] [place] [address] [place], [place],  MD -PCP Follow up in 1-2 weeks         ATTENDING NOTE: I reviewed above note and agree with the assessment and plan. I have made any additions or clarifications directly to the above note. Pt was seen and examined.   52 year old male with history of ESRD on hemodialysis, HTN, CVA without deficit, BPH, depression admitted for left-sided weakness and numbness, left facial droop.  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  CTA head showed right PCA stenosis but no AVM or aneurysm.  Repeat CT stable.  Ejection fraction 55-60%, carotid Doppler negative.  A1c 4.0 and LDL 73.  Patient BP improved, currently on Norvasc and clonidine.  Increase clonidine dose.  PT OT recommended SNF.  Rosalin Hawking, MD PhD Stroke Neurology 12/14/2017 12:08 AM     Please page stroke NP  Or  PA  Or MD from 8am -4 pm  as this patient from this time will be  followed by the stroke.   You can look them up on [email or web address]  Password [email or web address]

## 2017-12-14 LAB — GLUCOSE, CAPILLARY
GLUCOSE-CAPILLARY: 97 mg/dL (ref 65–99)
GLUCOSE-CAPILLARY: 112 mg/dL — AB (ref 65–99)
Glucose-Capillary: 76 mg/dL (ref 65–99)
Glucose-Capillary: 144 mg/dL — ABNORMAL HIGH (ref 65–99)

## 2017-12-14 MED ORDER — CLONIDINE HCL 0.1 MG PO TABS
0.3000 mg | ORAL_TABLET | Freq: Three times a day (TID) | ORAL | Status: DC
  Administered 2017-12-15 – 2017-12-16 (×4): 0.3 mg via ORAL
  Filled 2017-12-14 (×4): qty 3

## 2017-12-14 MED ORDER — CLONIDINE HCL 0.1 MG PO TABS
0.2000 mg | ORAL_TABLET | Freq: Three times a day (TID) | ORAL | Status: DC
  Administered 2017-12-14 (×3): 0.2 mg via ORAL
  Filled 2017-12-14 (×3): qty 2

## 2017-12-14 NOTE — Progress Notes (Signed)
[facility] Progress Note  Subjective: no new c/o  Vitals:   12/13/17 1741 12/13/17 2124 12/14/17 0116 12/14/17 0500  BP: (!) 162/75 (!) 170/78 (!) 175/82 (!) 159/77  Pulse: 64 62 69 63  Resp: 17 16 16 16   Temp: 98.7 F (37.1 C) 97.8 F (36.6 C) 98.7 F (37.1 C) 97.9 F (36.6 C)  TempSrc: Oral Oral Oral Oral  SpO2: 99% 98% 99% 97%  Weight:    71.4 kg (157 lb 6.5 oz)  Height:        Inpatient medications: .  stroke: mapping our early stages of recovery book   Does not apply Once  . amLODipine  10 mg Oral Daily  . atorvastatin  10 mg Oral q1800  . calcitRIOL  0.75 mcg Oral Once per day on Tue Thu Sat  . cloNIDine  0.2 mg Oral TID  . insulin aspart  0-9 Units Subcutaneous TID WC  . pantoprazole  40 mg Oral QHS  . polyethylene glycol  17 g Oral Daily  . senna-docusate  1 tablet Oral BID  . sevelamer carbonate  800 mg Oral TID WC   . sodium chloride    . sodium chloride     sodium chloride, sodium chloride, acetaminophen **OR** acetaminophen (TYLENOL) oral liquid 160 mg/5 mL **OR** acetaminophen, alteplase, heparin, labetalol, lidocaine (PF), lidocaine-prilocaine, LORazepam, pentafluoroprop-tetrafluoroeth, RESOURCE THICKENUP CLEAR, tiZANidine  Exam: General:Well appearing, NAD Heart:RRR; no murmur Lungs:CTA anteriorly Extremities:No LE edema Dialysis Access:AVF + thrill L sided hemiparesis, L facial droop  TTS at [facility] 4hr  65kg   2/2 bath  AVF  Hep 2000 - [facility] IV q 2 weeks (last 1/3) - Calcitriol 0.107mcg PO q HD  Assessment/Plan: 1. R Thalamic Hemorrhagic Stroke (w/ L sided deficits): Per neuro. 2. ESRD:Continue TTS HD. NO HEPARIN WITH HD. 3. Hypertension/volume:BP stable on norvasc/ clonidine. At dry wt after 4L off yest, BP's still up some. Doubt wt's from today are correct 4. Anemia:Hgb 11.2 -> 8.4. Not due for ESA again. 5. Metabolic bone disease:Corr Ca/Phos ok. Follow. Resuem VDRA. 6. Type 2 DM: Per primary. 7.  Dispo  - awaiting SNF placement, SW looking [facility] because patient has no insurance and local facilities won't take him. He filled out Medicaid applications this week   Plan - HD TTS   [facility] MD [facility] pager [phone]   12/14/2017, 1:26 PM   Recent Labs  Lab 12/09/17 0455 12/11/17 0706 12/13/17 0300  NA 129* 130* 126*  K 4.4 5.0 5.3*  CL 95* 96* 94*  CO2 16* 25 25  GLUCOSE 122* 76 122*  BUN 32* 18 22*  CREATININE 11.01* 7.65* 6.12*  CALCIUM 7.4* 8.5* 7.8*  PHOS 3.3 3.8  --    Recent Labs  Lab 12/07/17 1619 12/09/17 0455 12/11/17 0706 12/13/17 0300  AST 18 15  --   --   ALT 17 12*  --   --   ALKPHOS 345* 242*  --  209*  BILITOT 1.2 0.8  --   --   PROT 6.7 5.5*  --   --   ALBUMIN 3.4* 2.8* 2.7*  --    Recent Labs  Lab 12/07/17 1619  12/09/17 0455 12/11/17 0706 12/13/17 0300  WBC 6.9  --  7.2 10.1 6.6  NEUTROABS 3.7  --   --   --   --   HGB 10.4*   < > 8.4* 8.7* 8.8*  HCT 31.9*   < > 25.1* 27.3* 27.9*  MCV 86.7  --  89.6 91.0 90.3  PLT 394  --  362 370 294   < > = values in this interval not displayed.   Iron/TIBC/Ferritin/ %Sat    Component Value Date/Time   IRON 7 (L) 07/01/2016 0536   TIBC 179 (L) 07/01/2016 0536   FERRITIN 194 07/02/2016 0544   IRONPCTSAT 4 (L) 07/01/2016 3875

## 2017-12-14 NOTE — Progress Notes (Signed)
NEUROHOSPITALISTS - STROKE TEAM DAILY PROGRESS NOTE   SUBJECTIVE Friend / family member at the bedside. The patient appears more alert today. Dr. Erlinda Hong encouraged passive range of motion for the left upper and lower extremities.  Physical Examination: Vitals:   12/13/17 1741 12/13/17 2124 12/14/17 0116 12/14/17 0500  BP: (!) 162/75 (!) 170/78 (!) 175/82 (!) 159/77  Pulse: 64 62 69 63  Resp: _0 Temp: 98.7 F (37.1 C) 97.8 F (36.6 C) 98.7 F (37.1 C) 97.9 F (36.6 C)  TempSrc: Oral Oral Oral Oral  SpO2: 99% 98% 99% 97%  Weight:    157 lb 6.5 oz (71.4 kg)  Height:       HEENT-  Normocephalic, Normal external eye/conjunctiva.  Normal external ears. Normal external nose, mucus membranes and septum.   Cardiovascular - Regular rate and rhythm  Respiratory - Lungs clear bilaterally. Non-labored breathing, No wheezing. Abdomen - soft and non-tender, BS normal Extremities- no edema or cyanosis Skin-warm and dry  Neurological Examination Mental Status: Awake,oriented x 3  Speech is slightly dysarthric. Able to follow 3 step commands without difficulty. Cranial Nerves: II: Left field cut. Pupils are equal, round, and reactive to light.   III,IV, VI: EOMI without ptosis or diploplia. No nystagmus.   V: Facial sensation is decreased on the Left VII: Facial movement is decreased on the Left VIII: hearing is intact to voice X: Uvula elevates symmetrically XI: Shoulder shrug is decreased on the left XII: tongue is midline without atrophy or fasciculations.  Motor: Tone is normal. Bulk is normal. 5/5 strength on the Right.  Dense left hemiplegia and Strength is 1/5 on the Left. Arm and 0/5 Left leg  Sensor: Sensation is decreased on the Left in the arms and legs. Normal on the Right Deep Tendon Reflexes: 1+ and symmetric throughout in the biceps and patellae Plantars: Toes are downgoing bilaterally.  Cerebellar: unable to perform finger-to-nose on Left Gait:  Truncal ataxia to Left   Lab Results: CBC: Recent Labs  Lab 12/09/17 0455 12/11/17 0706 12/13/17 0300  WBC 7.2 10.1 6.6  HGB 8.4* 8.7* 8.8*  HCT 25.1* 27.3* 27.9*  MCV 89.6 91.0 90.3  PLT 362 370 594   Basic Metabolic Panel: Recent Labs  Lab 12/07/17 1619 12/07/17 1632 12/09/17 0455 12/11/17 0706 12/13/17 0300  NA 127* 128* 129* 130* 126*  K 3.4* 3.6 4.4 5.0 5.3*  CL 91* 93* 95* 96* 94*  CO2 22  --  16* 25 25  GLUCOSE 97 102* 122* 76 122*  BUN 24* 26* 32* 18 22*  CREATININE 9.59* 10.30* 11.01* 7.65* 6.12*  CALCIUM 8.2*  --  7.4* 8.5* 7.8*  MG  --   --  1.7  --   --   PHOS  --   --  3.3 3.8  --    Liver Function Tests: Recent Labs  Lab 12/07/17 1619 12/09/17 0455 12/11/17 0706 12/13/17 0300  AST 18 15  --   --   ALT 17 12*  --   --   ALKPHOS 345* 242*  --  209*  BILITOT 1.2 0.8  --   --   PROT 6.7 5.5*  --   --   ALBUMIN 3.4* 2.8* 2.7*  --    Coagulation Studies: No results for input(s): APTT, INR in the last 72 hours.    Imaging:  Ct Head Code Stroke Wo Contrast Result Date: 12/07/2017 IMPRESSION:  1. Acute right thalamic hemorrhage. Mild edema and local mass  effect.  2. 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  3. New age indeterminate left thalamic lacunar infarct.    Ct Angio Head W Or Wo Contrast Result Date: 12/07/2017 IMPRESSION: 1. No aneurysm, vascular malformation, or spot sign associated with the right thalamic hemorrhage. 2. Patent circle of Willis with intracranial atherosclerosis as above.  Moderate to severe right PCA stenoses.   Ct Head Wo Contrast Result Date: 12/08/2017 IMPRESSION:  1. Stable right thalamic hematoma and small volume of hemorrhage in right lateral ventricle.  2. Mild increase of mass effect with 5 mm right-to-left midline shift, previously 4 mm.  3. No new acute intracranial abnormality identified.    ECHO  12/12/2017 Study Conclusions - Left ventricle: The cavity  size was normal. There was moderate   concentric hypertrophy. Systolic function was normal. The   estimated ejection fraction was in the range of 55% to 60%. Wall   motion was normal; there were no regional wall motion   abnormalities. There was a reduced contribution of atrial   contraction to ventricular filling, due to increased ventricular   diastolic pressure or atrial contractile dysfunction. Doppler   parameters are consistent with a reversible restrictive pattern,   indicative of decreased left ventricular diastolic compliance   and/or increased left atrial pressure (grade 3 diastolic   dysfunction). Doppler parameters are consistent with high   ventricular filling pressure. - Aortic valve: Trileaflet; mildly thickened, mildly calcified   leaflets. Moderate focal calcification involving the left   coronary cusp. - Aorta: Ascending aorta diameter: 40 mm (ED). - Ascending aorta: The ascending aorta was mildly dilated. - Mitral valve: Calcified annulus. There was mild regurgitation. - Left atrium: The atrium was mildly dilated. - Right ventricle: The cavity size was mildly dilated. Wall   thickness was normal. - Tricuspid valve: There was trivial regurgitation  Carotid Duplex           12/11/2017 Final Interpretation: Right Carotid:  The extracranial vessels were near-normal with only minimal wall thickening or plaque. Left Carotid: The extracranial vessels were near-normal with only minimal wall thickening or plaque. Vertebrals: Left vertebral artery was patent with antegrade flow.  Right vertebral artery was not visualized. Subclavians: Normal flow hemodynamics were seen in the right subclavian artery.  Left subclavian artery exhibits monophasic flow, suggestive of possbile proximal obstruction     IMPRESSION: [name] is a 52 y.o. male with PMH of ESRD on Dialysis, HTN, Hx of CVA without deficits, BPH and Depression that presents to the ED as a code stroke with  reports of acute onset Left sided weakness. EMS reports that patient fell out of his bed at approximately 15:40. Patients room mate states that he last saw him normal at 07:00 this morning.   Room mate states that patient went back to bed at approximately 07:00 and he heard him fall out of bed at 15:40 today. On admission he has Left sided weakness, Left facial droop, decreased sensation on the Left, slightly dysarthric speech. CT Head reveals:  Acute right thalamic hemorrhage with mild cytotoxic edema New age indeterminate left thalamic lacunar infarct  Suspected Etiology: HTN hemorrhage and small vessel disease Resultant Symptoms: Left sided weakness, Left facial droop, decreased sensation on Left Stroke Risk Factors: hypertension, ESRD on Dialysis Other Stroke Risk Factors: Advanced age, ?ETOH use, Hx stroke  Outstanding Stroke Work-up Studies:   Work up completed at this time  ASSESSMENT 12/12/2017: Neuro exam remained stable. Increased home dose of Clonidine to 0.2 mg today for some sustained elevated B/P. We will continue to hold aspirin for now due to CT results on 1/7 with midline shift. Awaiting bed offer for SNF admission. Room mate at bedside today and POC, labs, imaging reviewed by Dr Leonie Man   PLAN  12/14/2017: Awaiting bed in SNF for discharge Frequent Neurochecks  Telemetry Monitoring Continue to HOLD Aspirin as patient has a right ICH For worsening mentation, obtain STAT Head CT- and contact Neurology immediately F/U PT/OT/SLP at SNF Ongoing aggressive stroke risk factor management Patient will be counseled to be compliant with his antithrombotic medications Patient will be counseled on Lifestyle modifications including, Diet, Exercise, and Stress Follow up with [facility] in 6 weeks   HX OF STROKES:  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   DYSPHAGIA:  Passed SLP swallow evaluation- Dysphagia 3  diet  Aspiration Precautions in progress   MEDICAL ISSUES:   ESRD on Dialysis  Stable, Nephrology following, Appreciate Assistance  K 5.3 Saturday 12/13/2017 - Na 126   Anemia Chronic  Stable   Repeat labs in AM 1/12 -  H/H  8.8 / 27.9   Hyponatremia  Stable, Nephrology following  Repeat labs in AM 1/12 - Na 126  Hypocalcemia  Stable, Nephrology following   Repeat labs in AM 1/12  -  7.8   Elevated Alk Phos  Stable, Nephrology following  Repeat labs in AM 1/12  - improving - 209  Leg Spasms  Zanaflex BID PRN  Diarrhea  Resolved  No further reports per RN   Formed BM 12/12/2017  Imodium PRN    HYPERTENSION:  Stable, some sustained elevated B/P  Increased Clonidine to 0.2 mg BID 12/12/17 Friday.   Increased to TID Sunday.  SBP goal less than 140/90   Labetolol PRN  Long term BP goal normotensive.  Restarted home B/P medication Norvasc 12/08/2017  Home Meds: Norvasc, Clonidine   HYPERLIPIDEMIA  Home Meds:  NONE  LDL  goal < 70, LDL 73  Will start Lipitor 10 mg  Continue statin at discharge   DIABETES:  HgbA1c goal < 7.0, HgbA1C = 4.0  Currently on:  Novolog  Continue CBG monitoring and SSI to maintain glucose 140-180 mg/dl  DM education     Other Active Problems: Active Problems:   IVH (intraventricular hemorrhage) (HCC)   Dysphagia, post-stroke   Type 2 diabetes mellitus with peripheral neuropathy (HCC)   History of tobacco abuse   Benign essential HTN   ESRD on dialysis ([place])   History of traumatic brain injury   History of CVA with residual deficit   History of transmetatarsal amputation of foot [facility])    Hospital day # 7 VTE prophylaxis: SCD's  Diet - Diet regular Room service appropriate? Yes; Fluid consistency: Thin   FAMILY UPDATES: No family at bedside  TEAM UPDATES:Malyia Moro, MD   Prior Home Stroke Medications:  No antithrombotic  Discharge Stroke Meds:  Please discharge patient on TBD , will  likely add ASA prior to discharge  Disposition: 01-Home or Self Care    Therapy Recs:   SNF - I spoke with the patient's social worker today, 12/13/2017, she informed me that because the patient needs dialysis he will need to go to a nursing facility in the [facility]. She is currently working on a bed; however, there are none available at this time.   Home Equipment:  TBD Follow Up:  Follow-up Information    Garvin Fila, MD. Schedule an appointment as soon as possible for a visit in 6 week(s).   Specialties:  Neurology, Radiology Contact information: [address] [place] [address] [place], [place], [place] -PCP Follow up in 1-2 weeks          Mikey Bussing PA-C [facility] Pager [phone] 12/14/2017, 2:56 PM   ATTENDING NOTE: I reviewed above note and agree with the assessment and plan. I have made any additions or clarifications directly to the above note. Pt was seen and examined.   52 year old male with history of ESRD on hemodialysis, HTN, CVA without deficit, BPH, depression admitted for left-sided weakness and numbness, left facial droop.  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  CTA head showed right PCA stenosis but no AVM or aneurysm.  Repeat CT stable.  EF 55-60%, carotid Doppler negative.  A1c 4.0 and LDL 73.  Patient BP improved but still at high end, currently on Norvasc and clonidine.  Increase clonidine dose again today to 0.3 mg tid.  PT OT recommended SNF.   Rosalin Hawking, MD PhD Stroke Neurology 12/14/2017 10:45 PM    Please page stroke NP  Or  PA  Or MD from 8am -4 pm  as this patient from this time will be  followed by the stroke.   You can look them up on [email or web address]  Password [email or web address]

## 2017-12-15 LAB — GLUCOSE, CAPILLARY
GLUCOSE-CAPILLARY: 124 mg/dL — AB (ref 65–99)
GLUCOSE-CAPILLARY: 84 mg/dL (ref 65–99)
GLUCOSE-CAPILLARY: 125 mg/dL — AB (ref 65–99)
Glucose-Capillary: 95 mg/dL (ref 65–99)

## 2017-12-15 LAB — CBC
HEMATOCRIT: 30.1 % — AB (ref 39.0–52.0)
Hemoglobin: 9.6 g/dL — ABNORMAL LOW (ref 13.0–17.0)
MCH: 28.8 pg (ref 26.0–34.0)
MCHC: 31.9 g/dL (ref 30.0–36.0)
MCV: 90.4 fL (ref 78.0–100.0)
Platelets: 250 10*3/uL (ref 150–400)
RBC: 3.33 MIL/uL — ABNORMAL LOW (ref 4.22–5.81)
RDW: 17.4 % — AB (ref 11.5–15.5)
WBC: 5.6 10*3/uL (ref 4.0–10.5)

## 2017-12-15 LAB — BASIC METABOLIC PANEL
Anion gap: 12 (ref 5–15)
BUN: 36 mg/dL — AB (ref 6–20)
CALCIUM: 8.6 mg/dL — AB (ref 8.9–10.3)
CO2: 24 mmol/L (ref 22–32)
Chloride: 96 mmol/L — ABNORMAL LOW (ref 101–111)
Creatinine, Ser: 6.46 mg/dL — ABNORMAL HIGH (ref 0.61–1.24)
GFR calc non Af Amer: 9 mL/min — ABNORMAL LOW (ref >60)
GFR, EST AFRICAN AMERICAN: 10 mL/min — AB (ref >60)
Glucose, Bld: 92 mg/dL (ref 65–99)
Potassium: 5.7 mmol/L — ABNORMAL HIGH (ref 3.5–5.1)
SODIUM: 132 mmol/L — AB (ref 135–145)

## 2017-12-15 LAB — IRON AND TIBC
Iron: 37 ug/dL — ABNORMAL LOW (ref 45–182)
SATURATION RATIOS: 15 % — AB (ref 17.9–39.5)
TIBC: 239 ug/dL — AB (ref 250–450)
UIBC: 202 ug/dL

## 2017-12-15 LAB — FERRITIN: Ferritin: 629 ng/mL — ABNORMAL HIGH (ref 24–336)

## 2017-12-15 LAB — VITAMIN B12: Vitamin B-12: 587 pg/mL (ref 180–914)

## 2017-12-15 MED ORDER — HYDRALAZINE HCL 25 MG PO TABS
25.0000 mg | ORAL_TABLET | Freq: Three times a day (TID) | ORAL | Status: DC
  Administered 2017-12-16 – 2017-12-19 (×11): 25 mg via ORAL
  Filled 2017-12-15 (×11): qty 1

## 2017-12-15 MED ORDER — RENA-VITE PO TABS
1.0000 | ORAL_TABLET | Freq: Every day | ORAL | Status: DC
  Administered 2017-12-15 – 2018-01-10 (×27): 1 via ORAL
  Filled 2017-12-15 (×28): qty 1

## 2017-12-15 MED ORDER — SODIUM CHLORIDE 0.9 % IV SOLN
125.0000 mg | INTRAVENOUS | Status: AC
  Administered 2017-12-16 – 2017-12-30 (×7): 125 mg via INTRAVENOUS
  Filled 2017-12-15 (×16): qty 10

## 2017-12-15 MED ORDER — DARBEPOETIN ALFA 200 MCG/0.4ML IJ SOSY
200.0000 ug | PREFILLED_SYRINGE | INTRAMUSCULAR | Status: DC
  Administered 2017-12-16 – 2017-12-24 (×2): 200 ug via INTRAVENOUS
  Filled 2017-12-15: qty 0.4

## 2017-12-15 MED ORDER — AMLODIPINE BESYLATE 10 MG PO TABS
10.0000 mg | ORAL_TABLET | Freq: Every day | ORAL | Status: DC
  Administered 2017-12-15 – 2018-01-02 (×19): 10 mg via ORAL
  Filled 2017-12-15 (×19): qty 1

## 2017-12-15 NOTE — Progress Notes (Signed)
Took over patient care around 0400 patient resting comfortably.

## 2017-12-15 NOTE — Progress Notes (Signed)
Subjective: Interval History: has no complaint,doing better.  Objective: Vital signs in last 24 hours: Temp:  [97.8 F (36.6 C)-99.1 F (37.3 C)] 97.8 F (36.6 C) (01/14 0518) Pulse Rate:  [62-87] 62 (01/14 0518) Resp:  [16] 16 (01/14 0518) BP: (141-171)/(60-78) 171/75 (01/14 0518) SpO2:  [94 %-100 %] 94 % (01/14 0518) Weight:  [67.1 kg (147 lb 14.9 oz)] 67.1 kg (147 lb 14.9 oz) (01/13 1418) Weight change: -3.2 kg (-0.9 oz)  Intake/Output from previous day: No intake/output data recorded. Intake/Output this shift: No intake/output data recorded.  General appearance: cooperative, pale, slowed mentation and not using L side Resp: diminished breath sounds bilaterally Cardio: S1, S2 normal and systolic murmur: systolic ejection 2/6, decrescendo at 2nd left intercostal space GI: soft, non-tender; bowel sounds normal; no masses,  no organomegaly Extremities: AVF L FA  Lab Results: Recent Labs    12/13/17 0300 12/15/17 0757  WBC 6.6 5.6  HGB 8.8* 9.6*  HCT 27.9* 30.1*  PLT 294 250   BMET:  Recent Labs    12/13/17 0300 12/15/17 0757  NA 126* 132*  K 5.3* 5.7*  CL 94* 96*  CO2 25 24  GLUCOSE 122* 92  BUN 22* 36*  CREATININE 6.12* 6.46*  CALCIUM 7.8* 8.6*   No results for input(s): PTH in the last 72 hours. Iron Studies: No results for input(s): IRON, TIBC, TRANSFERRIN, FERRITIN in the last 72 hours.  Studies/Results: No results found.  I have reviewed the patient's current medications.  Assessment/Plan: 1 ESRD needs lower vol, needs renal diet to keep K under control 2 Anemia start esa, check Fe 3 HPTH check 4 HTN lower vol, lower meds 5 liver dz ETOH 6 NONADHERENCE recurrent issues 7 CVA P HD, esa, check labs.    LOS: 8 days   Wesley Burns 12/15/2017,9:46 AM

## 2017-12-15 NOTE — Progress Notes (Signed)
NEUROHOSPITALISTS - STROKE TEAM DAILY PROGRESS NOTE   SUBJECTIVE No family at the bedside. The patient more alert and interactive with examiner today. +BM today. No new/acute events reported overnight. Awaiting answer for SNF bed in [facility]  Physical Examination: Vitals:   12/14/17 2130 12/15/17 0000 12/15/17 0518 12/15/17 1003  BP: (!) 158/78 (!) 148/67 (!) 171/75 (!) 149/81  Pulse: 79 65 62 63  Resp: 16 16 16 20   Temp: 98.7 F (37.1 C) 98.8 F (37.1 C) 97.8 F (36.6 C) 98 F (36.7 C)  TempSrc: Oral Oral Oral Oral  SpO2: 100% 96% 94% 100%  Weight:      Height:       HEENT-  Normocephalic, Normal external eye/conjunctiva.  Normal external ears. Normal external nose, mucus membranes and septum.   Cardiovascular - Regular rate and rhythm  Respiratory - Lungs clear bilaterally. Non-labored breathing, No wheezing. Abdomen - soft and non-tender, BS normal Extremities- no edema or cyanosis Skin-warm and dry  Neurological Examination Mental Status: Awake,oriented x 3  Speech is slightly dysarthric. Able to follow 3 step commands without difficulty. Cranial Nerves: II: Left field cut. Pupils are equal, round, and reactive to light.   III,IV, VI: EOMI without ptosis or diploplia. No nystagmus.   V: Facial sensation is decreased on the Left VII: Facial movement is decreased on the Left VIII: hearing is intact to voice X: Uvula elevates symmetrically XI: Shoulder shrug is decreased on the left XII: tongue is midline without atrophy or fasciculations.  Motor: Tone is normal. Bulk is normal. 5/5 strength on the Right.  Dense left hemiplegia and Strength is 1/5 on the Left. Arm and 0/5 Left leg  Sensor: Sensation is decreased on the Left in the arms and legs. Normal on the Right Deep Tendon Reflexes: 1+ and symmetric throughout in the biceps and patellae Plantars: Toes are downgoing bilaterally.  Cerebellar: unable to perform finger-to-nose on Left Gait: Truncal  ataxia to Left   Lab Results: CBC: Recent Labs  Lab 12/11/17 0706 12/13/17 0300 12/15/17 0757  WBC 10.1 6.6 5.6  HGB 8.7* 8.8* 9.6*  HCT 27.3* 27.9* 30.1*  MCV 91.0 90.3 90.4  PLT 370 294 427   Basic Metabolic Panel: Recent Labs  Lab 12/09/17 0455 12/11/17 0706 12/13/17 0300 12/15/17 0757  NA 129* 130* 126* 132*  K 4.4 5.0 5.3* 5.7*  CL 95* 96* 94* 96*  CO2 16* 25 25 24   GLUCOSE 122* 76 122* 92  BUN 32* 18 22* 36*  CREATININE 11.01* 7.65* 6.12* 6.46*  CALCIUM 7.4* 8.5* 7.8* 8.6*  MG 1.7  --   --   --   PHOS 3.3 3.8  --   --    Liver Function Tests: Recent Labs  Lab 12/09/17 0455 12/11/17 0706 12/13/17 0300  AST 15  --   --   ALT 12*  --   --   ALKPHOS 242*  --  209*  BILITOT 0.8  --   --   PROT 5.5*  --   --   ALBUMIN 2.8* 2.7*  --    Coagulation Studies: No results for input(s): APTT, INR in the last 72 hours.   Imaging:  Ct Head Code Stroke Wo Contrast Result Date: 12/07/2017 IMPRESSION:  1. Acute right thalamic hemorrhage. Mild edema and local mass effect.  2. 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  3. New age indeterminate left thalamic lacunar infarct.   Ct Angio Head W Or Wo Contrast  Result Date: 12/07/2017 IMPRESSION: 1. No aneurysm, vascular malformation, or spot sign associated with the right thalamic hemorrhage. 2. Patent circle of Willis with intracranial atherosclerosis as above.  Moderate to severe right PCA stenoses.   Ct Head Wo Contrast Result Date: 12/08/2017 IMPRESSION:  1. Stable right thalamic hematoma and small volume of hemorrhage in right lateral ventricle.  2. Mild increase of mass effect with 5 mm right-to-left midline shift, previously 4 mm.  3. No new acute intracranial abnormality identified.   ECHO  12/12/2017 Study Conclusions - Left ventricle: The cavity size was normal. There was moderate   concentric hypertrophy. Systolic function was normal. The   estimated  ejection fraction was in the range of 55% to 60%. Wall   motion was normal; there were no regional wall motion   abnormalities. There was a reduced contribution of atrial   contraction to ventricular filling, due to increased ventricular   diastolic pressure or atrial contractile dysfunction. Doppler   parameters are consistent with a reversible restrictive pattern,   indicative of decreased left ventricular diastolic compliance   and/or increased left atrial pressure (grade 3 diastolic   dysfunction). Doppler parameters are consistent with high   ventricular filling pressure. - Aortic valve: Trileaflet; mildly thickened, mildly calcified   leaflets. Moderate focal calcification involving the left   coronary cusp. - Aorta: Ascending aorta diameter: 40 mm (ED). - Ascending aorta: The ascending aorta was mildly dilated. - Mitral valve: Calcified annulus. There was mild regurgitation. - Left atrium: The atrium was mildly dilated. - Right ventricle: The cavity size was mildly dilated. Wall   thickness was normal. - Tricuspid valve: There was trivial regurgitation  Carotid Duplex           12/11/2017 Final Interpretation: Right Carotid:  The extracranial vessels were near-normal with only minimal wall thickening or plaque. Left Carotid: The extracranial vessels were near-normal with only minimal wall thickening or plaque. Vertebrals: Left vertebral artery was patent with antegrade flow.  Right vertebral artery was not visualized. Subclavians: Normal flow hemodynamics were seen in the right subclavian artery.  Left subclavian artery exhibits monophasic flow, suggestive of possbile proximal obstruction     IMPRESSION: [name] is a 52 y.o. male with PMH of ESRD on Dialysis, HTN, Hx of CVA without deficits, BPH and Depression that presents to the ED as a code stroke with reports of acute onset Left sided weakness. EMS reports that patient fell out of his bed at approximately  15:40. Patients room mate states that he last saw him normal at 07:00 this morning.   Room mate states that patient went back to bed at approximately 07:00 and he heard him fall out of bed at 15:40 today. On admission he has Left sided weakness, Left facial droop, decreased sensation on the Left, slightly dysarthric speech. CT Head reveals:  Acute right thalamic hemorrhage with mild cytotoxic edema New age indeterminate left thalamic lacunar infarct  Suspected Etiology: HTN hemorrhage and small vessel disease Resultant Symptoms: Left sided weakness, Left facial droop, decreased sensation on Left Stroke Risk Factors: hypertension, ESRD on Dialysis Other Stroke Risk Factors: Advanced age, ?ETOH use, Hx stroke  Outstanding Stroke Work-up Studies:   Work up completed at this time  ASSESSMENT 12/12/2017: Neuro exam remained stable. Increased home dose of Clonidine to 0.2 mg today for some sustained elevated B/P. We will continue to hold aspirin for now due to CT results on 1/7 with midline shift. Awaiting bed offer for SNF admission. Room mate at bedside today and POC, labs, imaging reviewed by Dr Leonie Man  ASSESSMENT 12/14/2017: Patient BP improved but still at high end, currently on Norvasc and clonidine.  Increase clonidine dose again today to 0.3 mg tid.  PT OT recommended SNF.  ASSESSMENT 12/15/2017: Blood pressure trending down slowly. Will continue to monitor closely. Nephrology following. Appreciate assistance. Will check iron studies. HD in AM, +BM today  PLAN  12/15/2017: Awaiting bed in SNF for discharge Frequent Neurochecks  Telemetry Monitoring Continue to HOLD Aspirin as patient has a right ICH For worsening mentation, obtain STAT Head CT- and contact Neurology immediately F/U PT/OT/SLP at SNF Ongoing aggressive stroke risk factor management Patient will be counseled to be compliant with his antithrombotic medications Patient will  be counseled on Lifestyle modifications including, Diet, Exercise, and Stress Follow up with [facility] in 6 weeks  HX OF STROKES:  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  DYSPHAGIA:  Passed SLP swallow evaluation- Dysphagia 3 diet  Aspiration Precautions in progress  MEDICAL ISSUES:  ESRD on Dialysis  Stable, Nephrology following, Appreciate Assistance  HD in AM  Creatinine - 6.46  Anemia Chronic  Stable, trending up slowly   Iron Studies in progress  Nephrology to start ESA  Repeat labs in AM  Hyperkalemia Stable - HD in AM - K+ 5.7 Repeat labs in AM  Hyponatremia  Stable, Nephrology following  Repeat labs in AM  - Na 132  Hypocalcemia  Stable, Nephrology following   Repeat labs in AM   -  8.6  Elevated Alk Phos  Stable, Nephrology following  12/13/2017 - 209  Leg Spasms  Zanaflex BID PRN  Diarrhea  Resolved  No further reports per RN   Formed BM 12/12/2017  Imodium PRN  HYPERTENSION:  Stable, some sustained elevated B/P, Will continue to monitor closely  SBP goal less than 140/90   Labetolol PRN  Long term BP goal normotensive.  Restarted home B/P medication Norvasc 12/08/2017, Clonidine increased to 0.6m TID  Home Meds: Norvasc, Clonidine  HYPERLIPIDEMIA  Home Meds:  NONE  LDL  goal < 70, LDL 73  Will start Lipitor 10 mg  Continue statin at discharge  DIABETES:  HgbA1c goal < 7.0, HgbA1C = 4.0  Currently on:  Novolog  Continue CBG monitoring and SSI to maintain glucose 140-180 mg/dl  DM education   Other Active Problems: Active Problems:   IVH (intraventricular hemorrhage) (HCC)   Dysphagia, post-stroke   Type 2 diabetes mellitus with peripheral neuropathy (HCC)   History of tobacco abuse   Benign essential HTN   [place]   History of traumatic brain injury   History of CVA with residual deficit   History of transmetatarsal amputation of foot ([facility]    Hospital day # 8 VTE  prophylaxis: SCD's  Diet - Diet renal/carb modified with fluid restriction Diet-HS Snack? Nothing; Fluid restriction: 1200 mL Fluid; Room service appropriate? Yes; Fluid consistency: Thin   FAMILY UPDATES: No family at bedside  TEAM UPDATES:Elier Zellars, MD   Prior Home Stroke Medications:  No antithrombotic  Discharge Stroke Meds:  Please discharge patient on TBD , will likely add ASA prior to discharge  Disposition: 01-Home or Self Care   Therapy Recs:  SNF - I spoke with the patient's social worker today, 12/15/2017, she informed me that because the patient needs dialysis he will need to go to a nursing facility in the [facility] system. She is currently working on a bed at [facility]. Awaiting call back and bed availability  Home Equipment:         TBD Follow Up:  Follow-up Information    Garvin Fila, MD. Schedule an appointment as soon as possible for a visit in 6 week(s).   Specialties:  Neurology, Radiology Contact information: [address] [place] [address] [place], [place], [place] -PCP Follow up in 1-2 weeks         Mary Sella, ANP-C Neurology Stroke Team 12/15/2017, 12:05 PM   ATTENDING NOTE: I reviewed above note and agree with the assessment and plan. I have made any additions or clarifications directly to the above note. Pt was seen and examined.   52 year old male with history of ESRD on hemodialysis, HTN, CVA without deficit, BPH, depression admitted for left-sided weakness and numbness, left facial droop.  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  CTA head showed right PCA stenosis but no AVM or aneurysm.  Repeat CT stable.  EF 55-60%, carotid Doppler negative.  A1c 4.0 and LDL 73.  Patient BP improved but still at high end, currently on Norvasc and clonidine. Clonidine dose has been increased to 0.3 mg tid. Will add hydralazine 54m q8h. PT OT recommended SNF. Pending placement.  JRosalin Hawking MD  PhD Stroke Neurology 12/15/2017 11:54 PM   Please page stroke NP  Or  PA  Or MD from 8am -4 pm  as this patient from this time will be  followed by the stroke.   You can look them up on [email or web address]  Password [email or web address]

## 2017-12-15 NOTE — Progress Notes (Addendum)
CSW following for discharge planning. CSW called [facility] to discuss referral to see if they could offer a bed, and left a voicemail.  CSW will await call back, and will continue to follow.  Blenda Nicely, [place] Clinical Social Worker [phone]   UPDATE 2:51 PM:  CSW called [facility] again to discuss referral; spoke with receptionist, left message for Admissions to call back.   CSW will await call back and will continue to follow.  Blenda Nicely, [place] Clinical Social Worker [phone]

## 2017-12-15 NOTE — Care Management Note (Signed)
Case Management Note  Patient Details  Name: Wesley Burns MRN: [id number] Date of Birth: Oct 07, 1966  Subjective/Objective:                    Action/Plan: Pt awaiting SNF bed for rehab that can also get the pt to HD. CM following.   Expected Discharge Date:                  Expected Discharge Plan:  IP Rehab Facility  In-House Referral:     Discharge planning Services  CM Consult  Post Acute Care Choice:    Choice offered to:     DME Arranged:    DME Agency:     HH Arranged:    HH Agency:     Status of Service:  In process, will continue to follow  If discussed at Long Length of Stay Meetings, dates discussed:    Additional Comments:  Kermit Balo, RN 12/15/2017, 11:15 AM

## 2017-12-15 NOTE — Progress Notes (Signed)
Physical Therapy Treatment Patient Details Name: Wesley Burns MRN: [id number] DOB: 1966/03/06 Today's Date: 12/15/2017    History of Present Illness      PT Comments    Pt progresses towards PT goals today, demonstrating sit-to-stand and stand-pivot transfers with maxA physical assist +1 and +1 assist for equipment/safety. Pt follows verbal cues and commands well today. Pt tolerates therapeutic exercises (scapular retraction, reaching outside BOS with RUE to facilitate right trunk extensor activation and left trunk flexor activation, and PROM of LLE). Pt demonstrates improved sitting balance this session, able to sit upright for 30-second bouts x3, however continues to have difficulty with sitting posture symmetry. Current discharge plan remains appropriate. PT will follow acutely in order to maximiza mobility while in hospital setting.    Follow Up Recommendations  Supervision/Assistance - 24 hour;SNF     Equipment Recommendations  Other (comment)(TBD at next venue of care)    Recommendations for Other Services       Precautions / Restrictions      Mobility  Bed Mobility                  Transfers                    Ambulation/Gait                 Stairs            Wheelchair Mobility    Modified Rankin (Stroke Patients Only)       Balance Overall balance assessment: Needs assistance Sitting-balance support: Single extremity supported;Feet supported Sitting balance-Leahy Scale: Fair Sitting balance - Comments: Pt able to sit in chair without support for short periods of time. Pt unable to tolerate any change (results in LOB).     Standing balance-Leahy Scale: Zero                              Cognition Arousal/Alertness: Awake/alert Behavior During Therapy: Flat affect;WFL for tasks assessed/performed Overall Cognitive Status: No family/caregiver present to determine baseline cognitive functioning                      Current Attention Level: Selective         Problem Solving: Slow processing;Difficulty sequencing;Requires verbal cues;Requires tactile cues General Comments: Pt requiring extra time for movement and VC/TCs for mobility.       Exercises General Exercises - Lower Extremity Ankle Circles/Pumps: PROM;10 reps;Seated;Left Heel Slides: PROM;5 reps;Seated;Left Other Exercises Other Exercises: reach outside BOS with RUE (upwards and laterally) for activation of right trunk extensors and left lateral flexors x10 Other Exercises: sitting balance in chair 3 x 30 second holds Other Exercises: scapular retraction x5 with 5 second hold (noticed right side with more activation than left)    General Comments        Pertinent Vitals/Pain Pain Assessment: No/denies pain    Home Living                      Prior Function            PT Goals (current goals can now be found in the care plan section) Progress towards PT goals: Progressing toward goals    Frequency    Min 3X/week      PT Plan Current plan remains appropriate    Co-evaluation  AM-PAC PT "6 Clicks" Daily Activity  Outcome Measure  Difficulty turning over in bed (including adjusting bedclothes, sheets and blankets)?: Unable Difficulty moving from lying on back to sitting on the side of the bed? : Unable Difficulty sitting down on and standing up from a chair with arms (e.g., wheelchair, bedside commode, etc,.)?: Unable Help needed moving to and from a bed to chair (including a wheelchair)?: A Lot Help needed walking in hospital room?: Total Help needed climbing 3-5 steps with a railing? : Total 6 Click Score: 7    End of Session Equipment Utilized During Treatment: Gait belt Activity Tolerance: Patient tolerated treatment well Patient left: in chair;with chair alarm set;with call bell/phone within reach   PT Visit Diagnosis: Unsteadiness on feet (R26.81);Other abnormalities  of gait and mobility (R26.89);Hemiplegia and hemiparesis Hemiplegia - Right/Left: Left Hemiplegia - dominant/non-dominant: Non-dominant Hemiplegia - caused by: Other Nontraumatic intracranial hemorrhage     Time: 3614-4315 PT Time Calculation (min) (ACUTE ONLY): 41 min  Charges:  $Therapeutic Activity: 8-22 mins $Neuromuscular Re-education: 23-37 mins                    G Codes:       Reina Fuse, SPT   Reina Fuse 12/15/2017, 1:37 PM

## 2017-12-16 LAB — CBC
HCT: 28 % — ABNORMAL LOW (ref 39.0–52.0)
HEMOGLOBIN: 9.2 g/dL — AB (ref 13.0–17.0)
MCH: 28.8 pg (ref 26.0–34.0)
MCHC: 32.9 g/dL (ref 30.0–36.0)
MCV: 87.8 fL (ref 78.0–100.0)
Platelets: 229 10*3/uL (ref 150–400)
RBC: 3.19 MIL/uL — AB (ref 4.22–5.81)
RDW: 17.2 % — ABNORMAL HIGH (ref 11.5–15.5)
WBC: 6.2 10*3/uL (ref 4.0–10.5)

## 2017-12-16 LAB — GLUCOSE, CAPILLARY
GLUCOSE-CAPILLARY: 105 mg/dL — AB (ref 65–99)
Glucose-Capillary: 110 mg/dL — ABNORMAL HIGH (ref 65–99)
Glucose-Capillary: 146 mg/dL — ABNORMAL HIGH (ref 65–99)
Glucose-Capillary: 165 mg/dL — ABNORMAL HIGH (ref 65–99)
Glucose-Capillary: 95 mg/dL (ref 65–99)

## 2017-12-16 LAB — RENAL FUNCTION PANEL
ANION GAP: 11 (ref 5–15)
Albumin: 2.8 g/dL — ABNORMAL LOW (ref 3.5–5.0)
BUN: 50 mg/dL — ABNORMAL HIGH (ref 6–20)
CALCIUM: 8.3 mg/dL — AB (ref 8.9–10.3)
CO2: 23 mmol/L (ref 22–32)
Chloride: 95 mmol/L — ABNORMAL LOW (ref 101–111)
Creatinine, Ser: 7.88 mg/dL — ABNORMAL HIGH (ref 0.61–1.24)
GFR calc non Af Amer: 7 mL/min — ABNORMAL LOW (ref >60)
GFR, EST AFRICAN AMERICAN: 8 mL/min — AB (ref >60)
Glucose, Bld: 100 mg/dL — ABNORMAL HIGH (ref 65–99)
Phosphorus: 5 mg/dL — ABNORMAL HIGH (ref 2.5–4.6)
Potassium: 6.3 mmol/L (ref 3.5–5.1)
SODIUM: 129 mmol/L — AB (ref 135–145)

## 2017-12-16 LAB — HEPATITIS B SURFACE ANTIGEN: HEP B S AG: NEGATIVE

## 2017-12-16 MED ORDER — LIDOCAINE-PRILOCAINE 2.5-2.5 % EX CREA: 1.0000 "application " | TOPICAL_CREAM | CUTANEOUS | Status: DC | PRN

## 2017-12-16 MED ORDER — SODIUM CHLORIDE 0.9 % IV SOLN: 100.0000 mL | INTRAVENOUS | Status: DC | PRN

## 2017-12-16 MED ORDER — LIDOCAINE HCL (PF) 1 % IJ SOLN: 5.0000 mL | INTRAMUSCULAR | Status: DC | PRN

## 2017-12-16 MED ORDER — SODIUM CHLORIDE 0.9 % IV SOLN: 125.0000 mg | INTRAVENOUS | Status: DC

## 2017-12-16 MED ORDER — HEPARIN SODIUM (PORCINE) 1000 UNIT/ML DIALYSIS: 1000.0000 [IU] | INTRAMUSCULAR | Status: DC | PRN

## 2017-12-16 MED ORDER — CLONIDINE HCL 0.1 MG PO TABS
0.1000 mg | ORAL_TABLET | Freq: Three times a day (TID) | ORAL | Status: DC
  Administered 2017-12-16 – 2017-12-18 (×5): 0.1 mg via ORAL
  Filled 2017-12-16 (×5): qty 1

## 2017-12-16 MED ORDER — PENTAFLUOROPROP-TETRAFLUOROETH EX AERO: 1.0000 "application " | INHALATION_SPRAY | CUTANEOUS | Status: DC | PRN

## 2017-12-16 MED ORDER — DARBEPOETIN ALFA 200 MCG/0.4ML IJ SOSY
PREFILLED_SYRINGE | INTRAMUSCULAR | Status: AC
  Administered 2017-12-16: 200 ug via INTRAVENOUS
  Filled 2017-12-16: qty 0.4

## 2017-12-16 MED ORDER — ALTEPLASE 2 MG IJ SOLR: 2.0000 mg | Freq: Once | INTRAMUSCULAR | Status: DC | PRN

## 2017-12-16 NOTE — Procedures (Signed)
I was present at this session.  I have reviewed the session itself and made appropriate changes.  HD via LLA avf,  bp coming down, eval meds post.   Access press ok.  To get 4 L  Wesley Burns 1/15/20198:17 AM

## 2017-12-16 NOTE — Progress Notes (Signed)
[facility] Progress Note   Subjective:   Feels ok. No new complaints.   Objective Vitals:   12/16/17 0640 12/16/17 0720 12/16/17 0729 12/16/17 0735  BP: (!) 162/77 (!) 150/88 (!) 156/81 (!) 154/84  Pulse: 60 61 (!) 55 (!) 59  Resp: 18 16    Temp: (!) 97.4 F (36.3 C) 98 F (36.7 C)    TempSrc: Oral Oral    SpO2: 99% 98%    Weight:  70.2 kg (154 lb 12.2 oz)    Height:       Physical Exam General:NAD, +pale male, lying in bed. Heart:RRR, +2/6 systolic murmur, no rub or gallop Lungs:mostly CTAB, +rales in RLL, no wheeze or rhonchi Abdomen:soft, NTND, +BS Extremities:no edema NEURO: not using left side, L facial droop Dialysis Access: LU AVF cannulated    Filed Weights   12/14/17 0500 12/14/17 1418 12/16/17 0720  Weight: 71.4 kg (157 lb 6.5 oz) 67.1 kg (147 lb 14.9 oz) 70.2 kg (154 lb 12.2 oz)    Intake/Output Summary (Last 24 hours) at 12/16/2017 0803 Last data filed at 12/16/2017 0641 Gross per 24 hour  Intake 240 ml  Output 50 ml  Net 190 ml    Additional Objective Labs: Basic Metabolic Panel: Recent Labs  Lab 12/11/17 0706 12/13/17 0300 12/15/17 0757  NA 130* 126* 132*  K 5.0 5.3* 5.7*  CL 96* 94* 96*  CO2 25 25 24   GLUCOSE 76 122* 92  BUN 18 22* 36*  CREATININE 7.65* 6.12* 6.46*  CALCIUM 8.5* 7.8* 8.6*  PHOS 3.8  --   --    Liver Function Tests: Recent Labs  Lab 12/11/17 0706 12/13/17 0300  ALKPHOS  --  209*  ALBUMIN 2.7*  --    CBC: Recent Labs  Lab 12/11/17 0706 12/13/17 0300 12/15/17 0757  WBC 10.1 6.6 5.6  HGB 8.7* 8.8* 9.6*  HCT 27.3* 27.9* 30.1*  MCV 91.0 90.3 90.4  PLT 370 294 250    CBG: Recent Labs  Lab 12/15/17 0700 12/15/17 1214 12/15/17 1658 12/15/17 2135 12/16/17 0649  GLUCAP 95 124* 84 125* 95   Iron Studies:  Recent Labs    12/15/17 1216  IRON 37*  TIBC 239*  FERRITIN 629*    Medications: . sodium chloride    . sodium chloride    . sodium chloride    . sodium chloride    . ferric  gluconate (FERRLECIT/NULECIT) IV     .  stroke: mapping our early stages of recovery book   Does not apply Once  . amLODipine  10 mg Oral QHS  . atorvastatin  10 mg Oral q1800  . calcitRIOL  0.75 mcg Oral Once per day on Tue Thu Sat  . cloNIDine  0.3 mg Oral TID  . darbepoetin (ARANESP) injection - DIALYSIS  200 mcg Intravenous Q Tue-HD  . hydrALAZINE  25 mg Oral Q8H  . insulin aspart  0-9 Units Subcutaneous TID WC  . multivitamin  1 tablet Oral QHS  . pantoprazole  40 mg Oral QHS  . polyethylene glycol  17 g Oral Daily  . senna-docusate  1 tablet Oral BID  . sevelamer carbonate  800 mg Oral TID WC    Dialysis Orders:  TTS at [facility] 4hr  65kg   2/2 bath  AVF  Hep 2000 - [facility] IV q 2 weeks (last 1/3) - Calcitriol 0.71mcg PO q HD   Assessment/Plan: 1. R Thalamic Hemorrhagic Stroke (w/L sided deficits) - per Neuro 2.  ESRD - Continue regular schedule, TTS while admitted. NO HEPARIN with HD. K 5.7. Non adherence issues as outpatient.  3. Anemia of CKD- Hgb trending up 8.8>9.6, Tsat 15%, IV iron started. On Aranesp qHD (Tues) Monitor. 4. Secondary hyperparathyroidism - Ca 8.6, P on 1/10 3.9, need new P. Continue VDRA and binders. 5. HTN/volume - BP improved with HD. 5kg over EDW according to bed weights. Titrate down volume as tolerated. Reinforce fluid restriction.  6. Nutrition - Renal diet, rena-vite, last ALB 2.7, Nepro TID.  7. Type 2 DM - per primary 8. Dispo - to SNF, per primary. 9 NONADHERENCE TO HD, MEDs 10 ^ LFTs ETOH  Virgina Norfolk, PA-C [facility] Pager: [phone] 12/16/2017,8:03 AM  LOS: 9 days  I have seen and examined this patient and agree with the plan of care seen , eval, examined, counseled.  Discussed with extender. Fayrene Fearing [place] 12/16/2017, 9:48 AM

## 2017-12-16 NOTE — Care Management Note (Signed)
Case Management Note  Patient Details  Name: Wesley Burns MRN: [id number] Date of Birth: August 21, 1966  Subjective/Objective:                    Action/Plan: Pt is awaiting SNF placement once he is able to be in the chair for 4 hours and able to assist with transfers. CM following for d/c disposition.  Expected Discharge Date:                  Expected Discharge Plan:  Skilled Nursing Facility  In-House Referral:  Clinical Social Work  Discharge planning Services  CM Consult  Post Acute Care Choice:    Choice offered to:     DME Arranged:    DME Agency:     HH Arranged:    HH Agency:     Status of Service:  In process, will continue to follow  If discussed at Long Length of Stay Meetings, dates discussed:    Additional Comments:  Kermit Balo, RN 12/16/2017, 1:41 PM

## 2017-12-16 NOTE — Progress Notes (Signed)
NEUROHOSPITALISTS - STROKE TEAM DAILY PROGRESS NOTE   SUBJECTIVE No family at the bedside. Patient alert and interactive with examiner. Recently returned from HD. No new/acute events reported overnight. Plan in progress to have patient sit in chair four hours per day.  Physical Examination: Vitals:   12/16/17 1100 12/16/17 1130 12/16/17 1221 12/16/17 1409  BP: 92/63 118/67 (!) 155/68 138/62  Pulse: 60 (!) 54 (!) 54 (!) 51  Resp:  16 16 16   Temp:  98 F (36.7 C) 98.3 F (36.8 C) 98 F (36.7 C)  TempSrc:   Oral Oral  SpO2:  98% 99% 100%  Weight:  66 kg (145 lb 8.1 oz)    Height:       HEENT-  Normocephalic, Normal external eye/conjunctiva.  Normal external ears. Normal external nose, mucus membranes and septum.   Cardiovascular - Regular rate and rhythm  Respiratory - Lungs clear bilaterally. Non-labored breathing, No wheezing. Abdomen - soft and non-tender, BS normal Extremities- no edema or cyanosis Skin-warm and dry  Neurological Examination Mental Status: Awake,oriented x 3  Speech is slightly dysarthric. Able to follow 3 step commands without difficulty. Cranial Nerves: II: Left field cut. Pupils are equal, round, and reactive to light.   III,IV, VI: EOMI without ptosis or diploplia. No nystagmus.   V: Facial sensation is decreased on the Left VII: Facial movement is decreased on the Left VIII: hearing is intact to voice X: Uvula elevates symmetrically XI: Shoulder shrug is decreased on the left XII: tongue is midline without atrophy or fasciculations.  Motor: Tone is normal. Bulk is normal. 5/5 strength on the Right.  Dense left hemiplegia and Strength is 1/5 on the Left. Arm and 0/5 Left leg  Sensor: Sensation is decreased on the Left in the arms and legs. Normal on the Right Deep Tendon Reflexes: 1+ and symmetric throughout in the biceps and patellae Plantars: Toes are downgoing bilaterally.  Cerebellar: unable to perform finger-to-nose on  Left Gait: Truncal ataxia to Left   Lab Results: CBC: Recent Labs  Lab 12/13/17 0300 12/15/17 0757 12/16/17 0740  WBC 6.6 5.6 6.2  HGB 8.8* 9.6* 9.2*  HCT 27.9* 30.1* 28.0*  MCV 90.3 90.4 87.8  PLT 294 250 964   Basic Metabolic Panel: Recent Labs  Lab 12/11/17 0706 12/13/17 0300 12/15/17 0757 12/16/17 0744  NA 130* 126* 132* 129*  K 5.0 5.3* 5.7* 6.3*  CL 96* 94* 96* 95*  CO2 25 25 24 23   GLUCOSE 76 122* 92 100*  BUN 18 22* 36* 50*  CREATININE 7.65* 6.12* 6.46* 7.88*  CALCIUM 8.5* 7.8* 8.6* 8.3*  PHOS 3.8  --   --  5.0*   Liver Function Tests: Recent Labs  Lab 12/11/17 0706 12/13/17 0300 12/16/17 0744  ALKPHOS  --  209*  --   ALBUMIN 2.7*  --  2.8*   Imaging:  Ct Head Code Stroke Wo Contrast Result Date: 12/07/2017 IMPRESSION:  1. Acute right thalamic hemorrhage. Mild edema and local mass effect.  2. 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  3. New age indeterminate left thalamic lacunar infarct.   Ct Angio Head W Or Wo Contrast Result Date: 12/07/2017 IMPRESSION: 1. No aneurysm, vascular malformation, or spot sign associated with the right thalamic hemorrhage. 2. Patent circle of Willis with intracranial atherosclerosis as above.  Moderate to severe right PCA stenoses.   Ct Head Wo Contrast Result Date: 12/08/2017 IMPRESSION:  1. Stable right thalamic hematoma and small volume  of hemorrhage in right lateral ventricle.  2. Mild increase of mass effect with 5 mm right-to-left midline shift, previously 4 mm.  3. No new acute intracranial abnormality identified.   ECHO  12/12/2017 Study Conclusions - Left ventricle: The cavity size was normal. There was moderate   concentric hypertrophy. Systolic function was normal. The   estimated ejection fraction was in the range of 55% to 60%. Wall   motion was normal; there were no regional wall motion   abnormalities. There was a reduced contribution of atrial    contraction to ventricular filling, due to increased ventricular   diastolic pressure or atrial contractile dysfunction. Doppler   parameters are consistent with a reversible restrictive pattern,   indicative of decreased left ventricular diastolic compliance   and/or increased left atrial pressure (grade 3 diastolic   dysfunction). Doppler parameters are consistent with high   ventricular filling pressure. - Aortic valve: Trileaflet; mildly thickened, mildly calcified   leaflets. Moderate focal calcification involving the left   coronary cusp. - Aorta: Ascending aorta diameter: 40 mm (ED). - Ascending aorta: The ascending aorta was mildly dilated. - Mitral valve: Calcified annulus. There was mild regurgitation. - Left atrium: The atrium was mildly dilated. - Right ventricle: The cavity size was mildly dilated. Wall   thickness was normal. - Tricuspid valve: There was trivial regurgitation  Carotid Duplex           12/11/2017 Final Interpretation: Right Carotid:  The extracranial vessels were near-normal with only minimal wall thickening or plaque. Left Carotid: The extracranial vessels were near-normal with only minimal wall thickening or plaque. Vertebrals: Left vertebral artery was patent with antegrade flow.  Right vertebral artery was not visualized. Subclavians: Normal flow hemodynamics were seen in the right subclavian artery.  Left subclavian artery exhibits monophasic flow, suggestive of possbile proximal obstruction     IMPRESSION: [name] is a 52 y.o. male with PMH of ESRD on Dialysis, HTN, Hx of CVA without deficits, BPH and Depression that presents to the ED as a code stroke with reports of acute onset Left sided weakness, Left facial droop, decreased sensation on the Left, slightly dysarthric speech. . EMS reports that patient fell out of his bed at approximately 15:40. Patients room mate states that he last saw him normal at 07:00 this morning. CT Head  reveals:  Acute right thalamic hemorrhage with mild cytotoxic edema New age indeterminate left thalamic lacunar infarct  Suspected Etiology: HTN hemorrhage and small vessel disease Resultant Symptoms: Left sided weakness, Left facial droop, decreased sensation on Left Stroke Risk Factors: hypertension, ESRD on Dialysis Other Stroke Risk Factors: Advanced age, ?ETOH use, Hx stroke  Outstanding Stroke Work-up Studies:   Work up completed at this time                                                     ASSESSMENT 12/12/2017: Neuro exam remained stable. Increased home dose of Clonidine to 0.2 mg today for some sustained elevated B/P. We will continue to hold aspirin for now due to CT results on 1/7 with midline shift. Awaiting bed offer for SNF admission. Room mate at bedside today and POC, labs, imaging reviewed by Dr Leonie Man  ASSESSMENT 12/14/2017: Patient BP improved but still at high end, currently on Norvasc and clonidine.  Increase clonidine dose again  today to 0.3 mg tid.  PT OT recommended SNF.  ASSESSMENT 12/15/2017: Blood pressure trending down slowly. Will continue to monitor closely. Nephrology following. Appreciate assistance. Will check iron studies. HD in AM, +BM today Patient BP improved but still at high end, currently on Norvasc and clonidine. Clonidine dose has been increased to 0.3 mg tid. Will add hydralazine 10m q8h. PT OT recommended SNF. Pending placement.  ASSESSMENT 12/16/2017: Neuro exam stable. Continues with Left Hemiparesis. No family at bedside. Blood pressures slowly trending down. HD completed today. Iron replacement per Nephrology. No more diarrhea reported. Participating in therapies. Will need to be able to sit in chair x 4 hours per day for d/c to SNF. Plan in progress.  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  DYSPHAGIA:  Passed SLP swallow evaluation- Dysphagia 3 diet  Aspiration Precautions in progress  MEDICAL ISSUES:  ESRD on Dialysis  Stable, Nephrology following, Appreciate Assistance  HD today  Creatinine - 7.88  Anemia Chronic  Stable, trending up slowly   Iron Studies in progress  Nephrology to start ESA, IV Iron, Aranesp qHD  Repeat labs in AM  Hyperkalemia Stable - HD today - K+ 6.3 Repeat labs in AM  Hyponatremia  Stable, Nephrology following  Repeat labs in AM  - Na 129  Hypocalcemia - Secondary Hyperparathyroidism  Stable, Nephrology following   Repeat labs in AM   -  8.3  Elevated Alk Phos  Stable, Nephrology following  12/13/2017 - 209  Leg Spasms  Zanaflex BID PRN  Diarrhea  Resolved  No further reports per RN   Formed BM 12/12/2017  Imodium PRN  HYPERTENSION:  Stable, slowly trending down, Will continue to monitor closely  SBP goal less than 140/90   Labetolol PRN  Long term BP goal normotensive.  Patient currently on: Norvasc, Clonidine, Hydralazine  HYPERLIPIDEMIA  Home Meds:  NONE  LDL  goal < 70, LDL 73  Will start Lipitor 10 mg  Continue statin at discharge  DIABETES:  HgbA1c goal < 7.0, HgbA1C = 4.0  Currently on:  Novolog  Continue CBG monitoring and SSI to maintain glucose 140-180 mg/dl  DM education   Other Active Problems: Active Problems:   IVH (intraventricular hemorrhage) (HCC)   Dysphagia, post-stroke   Type 2 diabetes mellitus with peripheral neuropathy (HCC)   History of tobacco abuse   Benign essential HTN    [place] [facility] [place]   History of traumatic brain injury   History of CVA with residual deficit   History of transmetatarsal amputation of foot ([facility]    Hospital day # 9 VTE prophylaxis: SCD's  Diet - Diet renal/carb modified with fluid restriction Diet-HS Snack? Nothing; Fluid restriction: 1200 mL Fluid; Room service appropriate? Yes; Fluid consistency: Thin   FAMILY UPDATES: No family at bedside  TEAM UPDATES:Abdelaziz Westenberger, MD   Prior Home Stroke Medications:  No antithrombotic  Discharge Stroke Meds:  Please discharge patient on TBD , will likely add ASA prior to discharge  Disposition: 01-Home or Self Care  Therapy Recs:   SNF - 12/16/2017, Case Management/MSW informed me that due to dialysis needs and no insurance patient will need to be discharged to [facility]. Notified patient will need to be able to sit up in chair x 4 hours per day to be discharged to SNF.  Plan in progress  Home Equipment:         TBD Follow Up:  Follow-up Information    Garvin Fila, MD. Schedule an appointment as soon as possible for a visit in 6 week(s).   Specialties:  Neurology, Radiology Contact information: [address] [place] [address] [place], [place], [place] -PCP Follow up in 1-2 weeks         Mary Sella, ANP-C Neurology Stroke Team 12/16/2017, 2:11 PM   ATTENDING NOTE: I reviewed above note and agree with the assessment and plan. I have made any additions or clarifications directly to the above note. Pt was seen and examined.   52 year old male with history of ESRD on HD, HTN, CVA without deficit, BPH, depression admitted for left-sided weakness and numbness, left facial droop. 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 CTA head showed right PCA stenosis but no AVM or aneurysm. Repeat CT stable. EF55-60%, carotid Doppler negative. A1c 4.0 and LDL 73.  Patient BP much improvedafter adding hydralazine, currently  on Norvasc 10 and clonidine 0.3 mg tid and hydralazine 76m q8h.PT OT recommended SNF. Pending placement. Will had HD today.   JRosalin Hawking MD PhD Stroke Neurology 12/16/2017 4:21 PM   Please page stroke NP  Or  PA  Or MD from 8am -4 pm  as this patient from this time will be  followed by the stroke.   You can look them up on [email or web address]  Password [email or web address]

## 2017-12-16 NOTE — Progress Notes (Signed)
OT Cancellation Note  Patient Details Name: BURKE TERRY MRN: [id number] DOB: 1966-01-01   Cancelled Treatment:    Reason Eval/Treat Not Completed: Patient at procedure or test/ unavailable.  Will reattempt.  Lavana Huckeba [place], OTR/L [phone]   Jeani Hawking M 12/16/2017, 10:57 AM

## 2017-12-16 NOTE — Progress Notes (Signed)
1230 Patient returned from HD at this time. VSS. Pt is now up in chair for lunch.  Sherilyn Windhorst,RN

## 2017-12-16 NOTE — Progress Notes (Signed)
Patient able to tolerate sitting position in chair from 1230pm to 1630 pm today. He decline any distress or pain. Pt able to assist with transfer using his strength on right side, able to put weight on right foot with 2 assists. Left side still noted flaccid.  Sim Boast, RN

## 2017-12-16 NOTE — Progress Notes (Signed)
Patient is currently in dialysis at this time. Pt left for HD prior to RN shift.

## 2017-12-17 DIAGNOSIS — I69991 Dysphagia following unspecified cerebrovascular disease: Secondary | ICD-10-CM

## 2017-12-17 DIAGNOSIS — I6990 Unspecified sequelae of unspecified cerebrovascular disease: Secondary | ICD-10-CM

## 2017-12-17 DIAGNOSIS — I619 Nontraumatic intracerebral hemorrhage, unspecified: Secondary | ICD-10-CM | POA: Diagnosis present

## 2017-12-17 DIAGNOSIS — E1149 Type 2 diabetes mellitus with other diabetic neurological complication: Secondary | ICD-10-CM

## 2017-12-17 LAB — CBC
HEMATOCRIT: 29.5 % — AB (ref 39.0–52.0)
HEMOGLOBIN: 9.5 g/dL — AB (ref 13.0–17.0)
MCH: 28.8 pg (ref 26.0–34.0)
MCHC: 32.2 g/dL (ref 30.0–36.0)
MCV: 89.4 fL (ref 78.0–100.0)
Platelets: 249 10*3/uL (ref 150–400)
RBC: 3.3 MIL/uL — AB (ref 4.22–5.81)
RDW: 17.1 % — AB (ref 11.5–15.5)
WBC: 4.6 10*3/uL (ref 4.0–10.5)

## 2017-12-17 LAB — COMPREHENSIVE METABOLIC PANEL
ALBUMIN: 3.1 g/dL — AB (ref 3.5–5.0)
ALK PHOS: 253 U/L — AB (ref 38–126)
ALT: 13 U/L — AB (ref 17–63)
AST: 20 U/L (ref 15–41)
Anion gap: 13 (ref 5–15)
BILIRUBIN TOTAL: 0.8 mg/dL (ref 0.3–1.2)
BUN: 28 mg/dL — AB (ref 6–20)
CO2: 25 mmol/L (ref 22–32)
CREATININE: 5.23 mg/dL — AB (ref 0.61–1.24)
Calcium: 8.5 mg/dL — ABNORMAL LOW (ref 8.9–10.3)
Chloride: 92 mmol/L — ABNORMAL LOW (ref 101–111)
GFR calc Af Amer: 13 mL/min — ABNORMAL LOW (ref >60)
GFR, EST NON AFRICAN AMERICAN: 11 mL/min — AB (ref >60)
Glucose, Bld: 189 mg/dL — ABNORMAL HIGH (ref 65–99)
POTASSIUM: 4.5 mmol/L (ref 3.5–5.1)
Sodium: 130 mmol/L — ABNORMAL LOW (ref 135–145)
TOTAL PROTEIN: 6.3 g/dL — AB (ref 6.5–8.1)

## 2017-12-17 LAB — GLUCOSE, CAPILLARY
GLUCOSE-CAPILLARY: 143 mg/dL — AB (ref 65–99)
Glucose-Capillary: 93 mg/dL (ref 65–99)
Glucose-Capillary: 153 mg/dL — ABNORMAL HIGH (ref 65–99)
Glucose-Capillary: 93 mg/dL (ref 65–99)

## 2017-12-17 LAB — PHOSPHORUS: Phosphorus: 5.1 mg/dL — ABNORMAL HIGH (ref 2.5–4.6)

## 2017-12-17 LAB — PTH, INTACT AND CALCIUM
Calcium, Total (PTH): 8.4 mg/dL — ABNORMAL LOW (ref 8.7–10.2)
PTH: 429 pg/mL — ABNORMAL HIGH (ref 15–65)

## 2017-12-17 LAB — MAGNESIUM: MAGNESIUM: 1.7 mg/dL (ref 1.7–2.4)

## 2017-12-17 NOTE — Progress Notes (Signed)
Subjective: Interval History: has complaints still weak on L.  Objective: Vital signs in last 24 hours: Temp:  [97.4 F (36.3 C)-98.3 F (36.8 C)] 98 F (36.7 C) (01/16 0700) Pulse Rate:  [51-60] 54 (01/16 0700) Resp:  [16] 16 (01/16 0700) BP: (92-161)/(59-81) 146/66 (01/16 0700) SpO2:  [97 %-100 %] 97 % (01/16 0700) Weight:  [66 kg (145 lb 8.1 oz)] 66 kg (145 lb 8.1 oz) (01/15 1130) Weight change:   Intake/Output from previous day: 01/15 0701 - 01/16 0700 In: 720 [P.O.:720] Out: 4000  Intake/Output this shift: No intake/output data recorded.  General appearance: alert, cooperative, pale and L hemiparesis Resp: clear to auscultation bilaterally Cardio: S1, S2 normal and systolic murmur: systolic ejection 2/6, decrescendo at 2nd left intercostal space GI: liver down 5 cm Extremities: avf LLA  Lab Results: Recent Labs    12/15/17 0757 12/16/17 0740  WBC 5.6 6.2  HGB 9.6* 9.2*  HCT 30.1* 28.0*  PLT 250 229   BMET:  Recent Labs    12/15/17 0757 12/16/17 0740 12/16/17 0744  NA 132*  --  129*  K 5.7*  --  6.3*  CL 96*  --  95*  CO2 24  --  23  GLUCOSE 92  --  100*  BUN 36*  --  50*  CREATININE 6.46*  --  7.88*  CALCIUM 8.6* 8.4* 8.3*   Recent Labs    12/16/17 0740  PTH 429*  Comment   Iron Studies:  Recent Labs    12/15/17 1216  IRON 37*  TIBC 239*  FERRITIN 629*    Studies/Results: No results found.  I have reviewed the patient's current medications.  Assessment/Plan: 1 ESRD  HD tomorrow 2 bp better with lower vol, cont to lower, then stop clonidine 3 Anemia esa 4 HPTH vit D 5 CVA 6  DM controlled 7 ^LFTs 8 NONADHERENCE P HD tomorrow, lower vol and meds, mobilize    LOS: 10 days   Fayrene Fearing Jae Skeet 12/17/2017,8:56 AM

## 2017-12-17 NOTE — Progress Notes (Signed)
NEUROHOSPITALISTS - STROKE TEAM DAILY PROGRESS NOTE   SUBJECTIVE Partner at the bedside. Patient is alert, sitting up in bed, feeding self breakfast. Talking and interactive with examiner. Asking appropriate questions about SNF and POC. No new/acute events reported overnight. Plan in progress to have patient sit in chair four hours today.  Physical Examination: Vitals:   12/16/17 2101 12/17/17 0051 12/17/17 0700 12/17/17 0942  BP: (!) 160/71 (!) 155/66 (!) 146/66 (!) 144/68  Pulse: (!) 51 (!) 56 (!) 54 (!) 52  Resp: 16 16 16 16   Temp: 97.8 F (36.6 C) (!) 97.4 F (36.3 C) 98 F (36.7 C) 97.9 F (36.6 C)  TempSrc: Oral Oral Oral Oral  SpO2: 99% 98% 97% 99%  Weight:      Height:       HEENT-  Normocephalic, Normal external eye/conjunctiva.  Normal external ears. Normal external nose, mucus membranes and septum.   Cardiovascular - Regular rate and rhythm  Respiratory - Lungs clear bilaterally. Non-labored breathing, No wheezing. Abdomen - soft and non-tender, BS normal Extremities- no edema or cyanosis Skin-warm and dry  Neurological Examination Mental Status: Awake,oriented x 3  Speech is slightly dysarthric. Able to follow 3 step commands without difficulty. Cranial Nerves: II: Left field cut. Pupils are equal, round, and reactive to light.   III,IV, VI: EOMI without ptosis or diploplia. No nystagmus.   V: Facial sensation is decreased on the Left VII: Facial movement is decreased on the Left VIII: hearing is intact to voice X: Uvula elevates symmetrically XI: Shoulder shrug is decreased on the left XII: tongue is midline without atrophy or fasciculations.  Motor: Tone is normal. Bulk is normal. 5/5 strength on the Right.  Dense left hemiplegia and Strength is 1/5 on the Left. Arm and 0/5 Left leg  Sensor: Sensation is decreased on the Left in the arms and legs. Normal on the Right Deep Tendon Reflexes: 1+ and symmetric throughout in the biceps and  patellae Plantars: Toes are downgoing bilaterally.  Cerebellar: unable to perform finger-to-nose on Left Gait: Truncal ataxia to Left   Lab Results: CBC: Recent Labs  Lab 12/15/17 0757 12/16/17 0740 12/17/17 0851  WBC 5.6 6.2 4.6  HGB 9.6* 9.2* 9.5*  HCT 30.1* 28.0* 29.5*  MCV 90.4 87.8 89.4  PLT 250 229 818   Basic Metabolic Panel: Recent Labs  Lab 12/11/17 0706 12/13/17 0300 12/15/17 0757 12/16/17 0740 12/16/17 0744 12/17/17 0851  NA 130* 126* 132*  --  129* 130*  K 5.0 5.3* 5.7*  --  6.3* 4.5  CL 96* 94* 96*  --  95* 92*  CO2 25 25 24   --  23 25  GLUCOSE 76 122* 92  --  100* 189*  BUN 18 22* 36*  --  50* 28*  CREATININE 7.65* 6.12* 6.46*  --  7.88* 5.23*  CALCIUM 8.5* 7.8* 8.6* 8.4* 8.3* 8.5*  MG  --   --   --   --   --  1.7  PHOS 3.8  --   --   --  5.0* 5.1*   Liver Function Tests: Recent Labs  Lab 12/11/17 0706 12/13/17 0300 12/16/17 0744 12/17/17 0851  AST  --   --   --  20  ALT  --   --   --  13*  ALKPHOS  --  209*  --  253*  BILITOT  --   --   --  0.8  PROT  --   --   --  6.3*  ALBUMIN 2.7*  --  2.8* 3.1*   Imaging:  Ct Head Code Stroke Wo Contrast Result Date: 12/07/2017 IMPRESSION:  1. Acute right thalamic hemorrhage. Mild edema and local mass effect.  2. 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  3. New age indeterminate left thalamic lacunar infarct.   Ct Angio Head W Or Wo Contrast Result Date: 12/07/2017 IMPRESSION: 1. No aneurysm, vascular malformation, or spot sign associated with the right thalamic hemorrhage. 2. Patent circle of Willis with intracranial atherosclerosis as above.  Moderate to severe right PCA stenoses.   Ct Head Wo Contrast Result Date: 12/08/2017 IMPRESSION:  1. Stable right thalamic hematoma and small volume of hemorrhage in right lateral ventricle.  2. Mild increase of mass effect with 5 mm right-to-left midline shift, previously 4 mm.  3. No new acute intracranial  abnormality identified.   ECHO  12/12/2017 Study Conclusions - Left ventricle: The cavity size was normal. There was moderate   concentric hypertrophy. Systolic function was normal. The   estimated ejection fraction was in the range of 55% to 60%. Wall   motion was normal; there were no regional wall motion   abnormalities. There was a reduced contribution of atrial   contraction to ventricular filling, due to increased ventricular   diastolic pressure or atrial contractile dysfunction. Doppler   parameters are consistent with a reversible restrictive pattern,   indicative of decreased left ventricular diastolic compliance   and/or increased left atrial pressure (grade 3 diastolic   dysfunction). Doppler parameters are consistent with high   ventricular filling pressure. - Aortic valve: Trileaflet; mildly thickened, mildly calcified   leaflets. Moderate focal calcification involving the left   coronary cusp. - Aorta: Ascending aorta diameter: 40 mm (ED). - Ascending aorta: The ascending aorta was mildly dilated. - Mitral valve: Calcified annulus. There was mild regurgitation. - Left atrium: The atrium was mildly dilated. - Right ventricle: The cavity size was mildly dilated. Wall   thickness was normal. - Tricuspid valve: There was trivial regurgitation  Carotid Duplex           12/11/2017 Final Interpretation: Right Carotid:  The extracranial vessels were near-normal with only minimal wall thickening or plaque. Left Carotid: The extracranial vessels were near-normal with only minimal wall thickening or plaque. Vertebrals: Left vertebral artery was patent with antegrade flow.  Right vertebral artery was not visualized. Subclavians: Normal flow hemodynamics were seen in the right subclavian artery.  Left subclavian artery exhibits monophasic flow, suggestive of possbile proximal obstruction     IMPRESSION: [name] is a 52 y.o. male with PMH of ESRD on Dialysis, HTN,  Hx of CVA without deficits, BPH and Depression that presents to the ED as a code stroke with reports of acute onset Left sided weakness, Left facial droop, decreased sensation on the Left, slightly dysarthric speech. . EMS reports that patient fell out of his bed at approximately 15:40. Patients room mate states that he last saw him normal at 07:00 this morning. CT Head reveals:  Acute right thalamic hemorrhage with mild cytotoxic edema New age indeterminate left thalamic lacunar infarct  Suspected Etiology: HTN hemorrhage and small vessel disease Resultant Symptoms: Left sided weakness, Left facial droop, decreased sensation on Left Stroke Risk Factors: hypertension, ESRD on Dialysis Other Stroke Risk Factors: Advanced age, ?ETOH use, Hx stroke  Outstanding Stroke Work-up Studies:   Work up completed at this time  ASSESSMENT 12/12/2017: Neuro exam remained stable. Increased home dose of Clonidine to 0.2 mg today for some sustained elevated B/P. We will continue to hold aspirin for now due to CT results on 1/7 with midline shift. Awaiting bed offer for SNF admission. Room mate at bedside today and POC, labs, imaging reviewed by Dr Leonie Man  ASSESSMENT 12/14/2017: Patient BP improved but still at high end, currently on Norvasc and clonidine.  Increase clonidine dose again today to 0.3 mg tid.  PT OT recommended SNF.  ASSESSMENT 12/15/2017: Blood pressure trending down slowly. Will continue to monitor closely. Nephrology following. Appreciate assistance. Will check iron studies. HD in AM, +BM today Patient BP improved but still at high end, currently on Norvasc and clonidine. Clonidine dose has been increased to 0.3 mg tid. Will add hydralazine 73m q8h. PT OT recommended SNF. Pending placement.  ASSESSMENT 12/16/2017: Neuro exam stable. Continues with Left Hemiparesis. No family at bedside. Blood pressures slowly trending down. HD completed today.  Iron replacement per Nephrology. No more diarrhea reported. Participating in therapies. Will need to be able to sit in chair x 4 hours per day for d/c to SNF. Plan in progress.  ASSESSMENT 12/17/2017: Neuro exam stable. No change in Left Hemiparesis. Per RN report patient has been able to sit in chair at least 4/hrs per day. Partner at bedside and POC reviewed. Continue to await SNF placement. Blood pressures and Labs remain stable and HD planned for tomorrow.   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  DYSPHAGIA:  Passed SLP swallow evaluation- Dysphagia 3 diet  Aspiration Precautions in progress  MEDICAL ISSUES:  ESRD on Dialysis - HD in AM (Thursday)  Stable, Nephrology following, Appreciate Assistance  HD today  Creatinine - 5.23  Anemia Chronic  Stable, trending up slowly   Iron Studies in progress  Nephrology to start ESA, IV Iron, Aranesp qHD  Repeat labs in AM  Hyperkalemia Stable - HD today - K+4.5 Repeat labs in AM  Hyponatremia  Stable, Nephrology following  Repeat labs in AM  - Na 130  Hypocalcemia - Secondary Hyperparathyroidism  Stable, Nephrology following   Repeat labs in AM   -  8.5  Elevated Alk Phos  Stable, Nephrology following  12/17/2017 - 253  Leg Spasms  Zanaflex BID PRN  Diarrhea  Resolved  No further reports per RN   Formed BM 12/12/2017  Imodium PRN  HYPERTENSION:  Stable, slowly trending  down, Will continue to monitor closely  SBP goal less than 140/90   Labetolol PRN  Long term BP goal normotensive.  Patient currently on: Norvasc, Clonidine, Hydralazine  HYPERLIPIDEMIA  Home Meds:  NONE  LDL  goal < 70, LDL 73  Will start Lipitor 10 mg  Continue statin at discharge  DIABETES:  HgbA1c goal < 7.0, HgbA1C = 4.0  Currently on:  Novolog  Continue CBG monitoring and SSI to maintain glucose 140-180 mg/dl  DM education   Other Active Problems: Active Problems:   IVH (intraventricular hemorrhage) (HCC)   Dysphagia, post-stroke  Type 2 diabetes mellitus with peripheral neuropathy (HCC)   History of tobacco abuse   Benign essential HTN   ESRD on dialysis [facility])   History of traumatic brain injury   History of CVA with residual deficit   History of transmetatarsal amputation of foot [facility])    Hospital day # 10 VTE prophylaxis: SCD's  Diet - Diet renal/carb modified with fluid restriction Diet-HS Snack? Nothing; Fluid restriction: 1200 mL Fluid; Room service appropriate? Yes; Fluid consistency: Thin   FAMILY UPDATES: No family at bedside  TEAM UPDATES:Marliyah Reid, MD   Prior Home Stroke Medications:  No antithrombotic  Discharge Stroke Meds:  Please discharge patient on TBD , will likely add ASA prior to discharge  Disposition: 01-Home or Self Care   Therapy Recs:   12/17/2017, Discussion with Case Management/MSW,  Discharge to [facility] -Pending set up with outpatient Hemodialysis. Patient has been able to sit up in chair x 4 hours per day per nursing.   Home Equipment:         TBD Follow Up:  Follow-up Information    Garvin Fila, MD. Schedule an appointment as soon as possible for a visit in 6 week(s).   Specialties:  Neurology, Radiology Contact information: [address] [place] [address] [place], [place], MD -PCP Follow up in 1-2 weeks         Mary Sella, ANP-C Neurology Stroke  Team 12/17/2017, 1:16 PM   ATTENDING NOTE: I reviewed above note and agree with the assessment and plan. I have made any additions or clarifications directly to the above note. Pt was seen and examined.   Patient BP much improvedafter adding hydralazine, currently on Norvasc 10 and clonidine0.3 mg tid and hydralazine 65m q8h.still pending placement. Had HD yesterday. No acute event overnight. Still has left hemiplegia.   JRosalin Hawking MD PhD Stroke Neurology 12/17/2017 3:25 PM    Please page stroke NP  Or  PA  Or MD from 8am -4 pm  as this patient from this time will be  followed by the stroke.   You can look them up on [email or web address]  Password [email or web address]

## 2017-12-17 NOTE — Progress Notes (Addendum)
Occupational Therapy Treatment Patient Details Name: Wesley Burns MRN: [id number] DOB: Jul 11, 1966 Today's Date: 12/17/2017    History of present illness 52 y.o. male with ESRD, Type 2 DM, HTN, GERD, and Hx of Right basal ganglia CVA (2017), right transmet amputation. Admitted with right thalamic hemorrhagic stroke.   OT comments  Pt making progress towards OT goals. Pt sat EOB during session to complete seated grooming ADLs and engaging in static and dynamic activity. Pt able to maintain static sitting for short periods of time with MinGuard assist, requiring increased assist during increased dynamic challenge. Pt completed stand pivot transfer with ModA+2 with Pt consistently following one step commands to assist as able. Further education provided on PROM/self-ROM to LUE with Pt return demonstrating. Feel SNF recommendation remains appropriate at this time. Will continue to follow acutely to progress Pt towards established OT goals.   Follow Up Recommendations  SNF    Equipment Recommendations  None recommended by OT          Precautions / Restrictions Precautions Precautions: Fall Restrictions Weight Bearing Restrictions: No       Mobility Bed Mobility Overal bed mobility: Needs Assistance Bed Mobility: Supine to Sit     Supine to sit: Mod assist;HOB elevated     General bed mobility comments: VCs for utilizing RUE/RLE to aid in progressing LUE/LLE towards EOB. Physical assist to aid with moving LLE off of bedside and to bring trunk to upright.   Transfers Overall transfer level: Needs assistance Equipment used: 2 person hand held assist Transfers: Stand Pivot Transfers   Stand pivot transfers: Mod assist;+2 physical assistance       General transfer comment: Pt aids in stand-pivot-transfer from EOB to chair, following command to utilize his RUE and RLE to power through transfer. Pt with left lateral lean upon standing, is able to correct with verbal cues. VCs to  reach back and feel chair before sitting. Pt in chair post-ambulation.     Balance Overall balance assessment: Needs assistance Sitting-balance support: Single extremity supported;Feet supported Sitting balance-Leahy Scale: Fair Sitting balance - Comments: Pt able to sit in chair without support for short periods of time. Pt able to tolerate only very slight changes and sometimes results in LOB.     Standing balance-Leahy Scale: Poor Standing balance comment: Pt able to stand with mod support for static standing balance.                            ADL either performed or assessed with clinical judgement   ADL Overall ADL's : Needs assistance/impaired Eating/Feeding: Supervision/ safety;Sitting;Bed level   Grooming: Wash/dry face;Min guard;Sitting Grooming Details (indicate cue type and reason): MinGuard for sitting balance at EOB; setup provided to completing grooming tasks              Lower Body Dressing: Total assistance;Bed level Lower Body Dressing Details (indicate cue type and reason): doffing/donning sock             Functional mobility during ADLs: Moderate assistance;+2 for physical assistance;+2 for safety/equipment(stand pivot ) General ADL Comments: Pt sat EOB for grooming ADLs, engaged in seated dynamic reaching and wt bearing activities with minguard to modA pending degree of dynamic challenge, requiring increased assist when leaning towards the L; Pt able to maintain static upright posture without physical assist for brief periods of time  Cognition Arousal/Alertness: Awake/alert Behavior During Therapy: WFL for tasks assessed/performed Overall Cognitive Status: No family/caregiver present to determine baseline cognitive functioning                                 General Comments: Pt following commands well today and reports that he has been doing his LUE PROM exercises; able to recall and return  demonstrate 2 PROM exercises        Exercises General Exercises - Upper Extremity Shoulder Flexion: Self ROM;Left;5 reps;Seated(0-90*) Shoulder Horizontal ABduction: Self ROM;Left;Seated;10 reps(0-90*) Shoulder Horizontal ADduction: Self ROM;Left;10 reps;Seated Elbow Flexion: Self ROM;10 reps;Left;Seated Elbow Extension: Self ROM;10 reps;Left;Seated Wrist Flexion: Self ROM;Left;5 reps;Seated Wrist Extension: Self ROM;Left;5 reps;Seated Other Exercises Other Exercises: reaching outside BOS in all directions to activate trunk musculature (x10) Other Exercises: sitting balance at EOB 5 x 10-seconds Other Exercises: scapular retraction x5 with 5 second hold (noticed right side with more activation than left) Other Exercises: weight-bearing through LUE (hand, then elbow) with assist x 30 seconds Other Exercises: seated weight-bearing through LLE with BUEs placed on knee and weight-shifted forward x 20-seconds                Pertinent Vitals/ Pain       Pain Assessment: No/denies pain                                                          Frequency  Min 2X/week        Progress Toward Goals  OT Goals(current goals can now be found in the care plan section)  Progress towards OT goals: Progressing toward goals  Acute Rehab OT Goals Patient Stated Goal: return home, read OT Goal Formulation: With patient Time For Goal Achievement: 12/22/17 Potential to Achieve Goals: Good  Plan Discharge plan remains appropriate    Co-evaluation    PT/OT/SLP Co-Evaluation/Treatment: Yes Reason for Co-Treatment: Complexity of the patient's impairments (multi-system involvement);To address functional/ADL transfers PT goals addressed during session: Mobility/safety with mobility;Balance;Strengthening/ROM OT goals addressed during session: ADL's and self-care;Strengthening/ROM      AM-PAC PT "6 Clicks" Daily Activity     Outcome Measure   Help from another  person eating meals?: A Little Help from another person taking care of personal grooming?: A Little Help from another person toileting, which includes using toliet, bedpan, or urinal?: A Lot Help from another person bathing (including washing, rinsing, drying)?: A Lot Help from another person to put on and taking off regular upper body clothing?: A Lot Help from another person to put on and taking off regular lower body clothing?: A Lot 6 Click Score: 14    End of Session Equipment Utilized During Treatment: Gait belt  OT Visit Diagnosis: Unsteadiness on feet (R26.81);Muscle weakness (generalized) (M62.81);Hemiplegia and hemiparesis Hemiplegia - Right/Left: Left Hemiplegia - dominant/non-dominant: Non-Dominant Hemiplegia - caused by: Cerebral infarction   Activity Tolerance Patient tolerated treatment well   Patient Left with call bell/phone within reach;in chair   Nurse Communication Mobility status;Other (comment)(okay to see Pt with current lab values)        Time: 3016-0109 OT Time Calculation (min): 38 min  Charges: OT General Charges $OT Visit: 1 Visit OT Treatments $Therapeutic Activity: 8-22 mins  Marcy Siren, [place] Pager [phone] 12/17/2017  Orlando Penner 12/17/2017, 2:16 PM

## 2017-12-17 NOTE — Progress Notes (Signed)
Physical Therapy Treatment Patient Details Name: Wesley Burns MRN: [id number] DOB: 13-Aug-1966 Today's Date: 12/17/2017    History of Present Illness 52 y.o. male with ESRD, Type 2 DM, HTN, GERD, and Hx of Right basal ganglia CVA (2017), right transmet amputation. Admitted with right thalamic hemorrhagic stroke.    PT Comments    Pt progressed towards goals today, demonstrating stand-pivot transfer from EOB to chair with modA+2. Pt follows commands to aid in transfer utilizing RUE and RLE strength. Pt reports that he has been doing LUE PROM exercises while lying in bed. Pt tolerates multiple UE, LE, and core exercises (see below), demonstrating ability to activate trunk musculature and bear weight through LUE and LLE with assist from PT/OT. Educated pt on utilizing RUE/RLE assist to progress LUE/LLE for bed mobility. Pt's partner in room throughout session and inquires, "When will we start to see some improvements?". Educated pt and partner on prognosis and possible outcomes regarding mobility. Current discharge plan remains appropriate. PT will follow acutely in order to maximize mobility while in hospital setting.  Follow Up Recommendations  Supervision/Assistance - 24 hour;SNF     Equipment Recommendations  Other (comment)(TBD at next venue of care)    Recommendations for Other Services       Precautions / Restrictions Precautions Precautions: Fall Restrictions Weight Bearing Restrictions: No    Mobility  Bed Mobility Overal bed mobility: Needs Assistance       Supine to sit: Mod assist;HOB elevated     General bed mobility comments: VCs for utilizing RUE/RLE to aid in progressing LUE/LLE towards EOB. Physical assist to aid with moving LLE off of bedside and to bring trunk to upright.   Transfers Overall transfer level: Needs assistance   Transfers: Stand Pivot Transfers   Stand pivot transfers: Mod assist;+2 physical assistance       General transfer comment: Pt  aids in stand-pivot-transfer from EOB to chair, following command to utilize his RUE and RLE to power through transfer. Pt with left lateral lean upon standing. VCs to reach back and feel chair before sitting. Pt in chair post-ambulation.   Ambulation/Gait                 Stairs            Wheelchair Mobility    Modified Rankin (Stroke Patients Only) Modified Rankin (Stroke Patients Only) Pre-Morbid Rankin Score: No symptoms Modified Rankin: Severe disability     Balance Overall balance assessment: Needs assistance Sitting-balance support: Single extremity supported;Feet supported Sitting balance-Leahy Scale: Fair Sitting balance - Comments: Pt able to sit in chair without support for short periods of time. Pt able to tolerate only very slight changes and sometimes results in LOB.     Standing balance-Leahy Scale: Poor Standing balance comment: Pt able to stand with mod support for static standing balance.                             Cognition Arousal/Alertness: Awake/alert Behavior During Therapy: WFL for tasks assessed/performed Overall Cognitive Status: No family/caregiver present to determine baseline cognitive functioning                                 General Comments: Pt following commands well today and reports that he has been doing his LUE PROM exercises.       Exercises Other Exercises Other Exercises: reaching outside  BOS in all directions to activate trunk musculature (x10) Other Exercises: sitting balance at EOB 5 x 10-seconds Other Exercises: scapular retraction x5 with 5 second hold (noticed right side with more activation than left) Other Exercises: weight-bearing through LUE (hand, then albow) with assist x 30 seconds Other Exercises: seated weight-bearing through LLE with BUEs placed on knee and weight-shifted forward x 20-seconds    General Comments        Pertinent Vitals/Pain Pain Assessment: No/denies pain     Home Living                      Prior Function            PT Goals (current goals can now be found in the care plan section) Progress towards PT goals: Progressing toward goals    Frequency    Min 3X/week      PT Plan Current plan remains appropriate    Co-evaluation PT/OT/SLP Co-Evaluation/Treatment: Yes Reason for Co-Treatment: Complexity of the patient's impairments (multi-system involvement);To address functional/ADL transfers PT goals addressed during session: Mobility/safety with mobility;Balance;Strengthening/ROM        AM-PAC PT "6 Clicks" Daily Activity  Outcome Measure  Difficulty turning over in bed (including adjusting bedclothes, sheets and blankets)?: Unable Difficulty moving from lying on back to sitting on the side of the bed? : Unable Difficulty sitting down on and standing up from a chair with arms (e.g., wheelchair, bedside commode, etc,.)?: Unable Help needed moving to and from a bed to chair (including a wheelchair)?: A Lot Help needed walking in hospital room?: Total Help needed climbing 3-5 steps with a railing? : Total 6 Click Score: 7    End of Session Equipment Utilized During Treatment: Gait belt Activity Tolerance: Patient tolerated treatment well Patient left: in chair;with chair alarm set;with call bell/phone within reach;with family/visitor present   PT Visit Diagnosis: Unsteadiness on feet (R26.81);Other abnormalities of gait and mobility (R26.89);Hemiplegia and hemiparesis Hemiplegia - Right/Left: Left Hemiplegia - dominant/non-dominant: Non-dominant Hemiplegia - caused by: Other Nontraumatic intracranial hemorrhage     Time: 1019-1057 PT Time Calculation (min) (ACUTE ONLY): 38 min  Charges:  $Neuromuscular Re-education: 8-22 mins                    G Codes:       Reina Fuse, SPT   Reina Fuse 12/17/2017, 1:55 PM

## 2017-12-17 NOTE — Plan of Care (Signed)
  Education: Knowledge of disease or condition will improve 12/17/2017 0116 - Progressing by Ashley Royalty, RN   Education: Knowledge of secondary prevention will improve 12/17/2017 0116 - Progressing by Ashley Royalty, RN

## 2017-12-17 NOTE — Progress Notes (Signed)
CSW following for discharge planning. CSW confirmed with Alfredo Batty that patient can admit to facility. CSW contacted Dialysis to discuss patient being clipped at outpatient dialysis center in [place]; clipping is still in process.   Patient cannot admit to SNF until clipped at new outpatient dialysis center. CSW alerted MD. CSW will continue to follow.  Blenda Nicely, [place] Clinical Social Worker [phone]

## 2017-12-18 LAB — CBC
HCT: 30.1 % — ABNORMAL LOW (ref 39.0–52.0)
HEMOGLOBIN: 9.9 g/dL — AB (ref 13.0–17.0)
MCH: 29.3 pg (ref 26.0–34.0)
MCHC: 32.9 g/dL (ref 30.0–36.0)
MCV: 89.1 fL (ref 78.0–100.0)
Platelets: 244 10*3/uL (ref 150–400)
RBC: 3.38 MIL/uL — AB (ref 4.22–5.81)
RDW: 17.2 % — ABNORMAL HIGH (ref 11.5–15.5)
WBC: 4.6 10*3/uL (ref 4.0–10.5)

## 2017-12-18 LAB — GLUCOSE, CAPILLARY
GLUCOSE-CAPILLARY: 98 mg/dL (ref 65–99)
GLUCOSE-CAPILLARY: 129 mg/dL — AB (ref 65–99)
Glucose-Capillary: 109 mg/dL — ABNORMAL HIGH (ref 65–99)
Glucose-Capillary: 127 mg/dL — ABNORMAL HIGH (ref 65–99)

## 2017-12-18 LAB — RENAL FUNCTION PANEL
ALBUMIN: 3.1 g/dL — AB (ref 3.5–5.0)
ANION GAP: 14 (ref 5–15)
BUN: 41 mg/dL — ABNORMAL HIGH (ref 6–20)
CALCIUM: 8.3 mg/dL — AB (ref 8.9–10.3)
CO2: 22 mmol/L (ref 22–32)
Chloride: 93 mmol/L — ABNORMAL LOW (ref 101–111)
Creatinine, Ser: 6.73 mg/dL — ABNORMAL HIGH (ref 0.61–1.24)
GFR calc Af Amer: 10 mL/min — ABNORMAL LOW (ref >60)
GFR calc non Af Amer: 8 mL/min — ABNORMAL LOW (ref >60)
Glucose, Bld: 164 mg/dL — ABNORMAL HIGH (ref 65–99)
PHOSPHORUS: 5.2 mg/dL — AB (ref 2.5–4.6)
Potassium: 4.6 mmol/L (ref 3.5–5.1)
SODIUM: 129 mmol/L — AB (ref 135–145)

## 2017-12-18 MED ORDER — LIDOCAINE HCL (PF) 1 % IJ SOLN: 5.0000 mL | INTRAMUSCULAR | Status: DC | PRN

## 2017-12-18 MED ORDER — ALTEPLASE 2 MG IJ SOLR
2.0000 mg | Freq: Once | INTRAMUSCULAR | Status: DC | PRN
  Filled 2017-12-18: qty 2

## 2017-12-18 MED ORDER — PENTAFLUOROPROP-TETRAFLUOROETH EX AERO: 1.0000 "application " | INHALATION_SPRAY | CUTANEOUS | Status: DC | PRN

## 2017-12-18 MED ORDER — HEPARIN SODIUM (PORCINE) 1000 UNIT/ML DIALYSIS: 1000.0000 [IU] | INTRAMUSCULAR | Status: DC | PRN

## 2017-12-18 MED ORDER — HEPARIN SODIUM (PORCINE) 1000 UNIT/ML DIALYSIS
1000.0000 [IU] | INTRAMUSCULAR | Status: DC | PRN
Start: 2017-12-18 — End: 2017-12-18
  Filled 2017-12-18: qty 1

## 2017-12-18 MED ORDER — LIDOCAINE-PRILOCAINE 2.5-2.5 % EX CREA
1.0000 "application " | TOPICAL_CREAM | CUTANEOUS | Status: DC | PRN
  Filled 2017-12-18: qty 5

## 2017-12-18 MED ORDER — LIDOCAINE-PRILOCAINE 2.5-2.5 % EX CREA: 1.0000 "application " | TOPICAL_CREAM | CUTANEOUS | Status: DC | PRN

## 2017-12-18 MED ORDER — SODIUM CHLORIDE 0.9 % IV SOLN: 100.0000 mL | INTRAVENOUS | Status: DC | PRN

## 2017-12-18 MED ORDER — ALTEPLASE 2 MG IJ SOLR: 2.0000 mg | Freq: Once | INTRAMUSCULAR | Status: DC | PRN

## 2017-12-18 NOTE — Plan of Care (Signed)
  Progressing Education: Knowledge of disease or condition will improve 12/18/2017 1141 - Progressing by Quentin Cornwall, RN Knowledge of secondary prevention will improve 12/18/2017 1141 - Progressing by Quentin Cornwall, RN Knowledge of patient specific risk factors addressed and post discharge goals established will improve 12/18/2017 1141 - Progressing by Quentin Cornwall, RN Coping: Will verbalize positive feelings about self 12/18/2017 1141 - Progressing by Quentin Cornwall, RN Will identify appropriate support needs 12/18/2017 1141 - Progressing by Quentin Cornwall, RN Health Behavior/Discharge Planning: Ability to manage health-related needs will improve 12/18/2017 1141 - Progressing by Quentin Cornwall, RN Self-Care: Ability to participate in self-care as condition permits will improve 12/18/2017 1141 - Progressing by Quentin Cornwall, RN Verbalization of feelings and concerns over difficulty with self-care will improve 12/18/2017 1141 - Progressing by Quentin Cornwall, RN Ability to communicate needs accurately will improve 12/18/2017 1141 - Progressing by Quentin Cornwall, RN Nutrition: Risk of aspiration will decrease 12/18/2017 1141 - Progressing by Quentin Cornwall, RN Dietary intake will improve 12/18/2017 1141 - Progressing by Quentin Cornwall, RN Intracerebral Hemorrhage Tissue Perfusion: Complications of Intracerebral Hemorrhage will be minimized 12/18/2017 1141 - Progressing by Quentin Cornwall, RN Education: Knowledge of General Education information will improve 12/18/2017 1141 - Progressing by Quentin Cornwall, RN

## 2017-12-18 NOTE — Progress Notes (Signed)
Subjective: Interval History: has no complaint , still can't move L arm.  Objective: Vital signs in last 24 hours: Temp:  [97.8 F (36.6 C)-98.6 F (37 C)] 97.9 F (36.6 C) (01/17 0845) Pulse Rate:  [52-64] 64 (01/17 0845) Resp:  [16-20] 20 (01/17 0845) BP: (133-162)/(68-79) 133/71 (01/17 0845) SpO2:  [95 %-100 %] 99 % (01/17 0845) Weight change:   Intake/Output from previous day: 01/16 0701 - 01/17 0700 In: 480 [P.O.:480] Out: -  Intake/Output this shift: No intake/output data recorded.  General appearance: alert, cooperative and pale Resp: clear to auscultation bilaterally Cardio: S1, S2 normal and systolic murmur: systolic ejection 2/6, decrescendo at 2nd left intercostal space GI: soft, non-tender; bowel sounds normal; no masses,  no organomegaly Extremities: AVF L arm B&T  Lab Results: Recent Labs    12/16/17 0740 12/17/17 0851  WBC 6.2 4.6  HGB 9.2* 9.5*  HCT 28.0* 29.5*  PLT 229 249   BMET:  Recent Labs    12/16/17 0744 12/17/17 0851  NA 129* 130*  K 6.3* 4.5  CL 95* 92*  CO2 23 25  GLUCOSE 100* 189*  BUN 50* 28*  CREATININE 7.88* 5.23*  CALCIUM 8.3* 8.5*   Recent Labs    12/16/17 0740  PTH 429*  Comment   Iron Studies:  Recent Labs    12/15/17 1216  IRON 37*  TIBC 239*  FERRITIN 629*    Studies/Results: No results found.  I have reviewed the patient's current medications.  Assessment/Plan: 1 ESRD for HD.  Lower vol 2 DM controlled 3 Anemia esa 4 HPTH 5 CVA  Per Neuro 6 NONADHERENCE 7 Liver dz P HD, esa, rehab, NHP    LOS: 11 days   Milanni Ayub 12/18/2017,9:31 AM

## 2017-12-18 NOTE — Progress Notes (Addendum)
NEUROHOSPITALISTS - STROKE TEAM DAILY PROGRESS NOTE   SUBJECTIVE No family at the bedside. Patient is alert, sitting up in bed, feeding self breakfast. Talking and interactive with examiner. No new/acute events reported overnight. Plan in progress to sit in chair four hours today. HD today. Per MSW no new information about SNF transfer.  Physical Examination: Vitals:   12/18/17 0516 12/18/17 0845 12/18/17 1215 12/18/17 1221  BP: (!) 161/76 133/71 (!) 146/82 (!) 155/81  Pulse: 62 64 63 63  Resp: 18 20 16    Temp: 98.6 F (37 C) 97.9 F (36.6 C) 98.5 F (36.9 C)   TempSrc: Oral Axillary Oral   SpO2: 95% 99% 98%   Weight:   68.2 kg (150 lb 5.7 oz)   Height:       HEENT-  Normocephalic,  Cardiovascular - Regular rate and rhythm, + murmur Respiratory - Lungs clear bilaterally. Non-labored breathing, No wheezing. Abdomen - soft and non-tender, BS normal Extremities- no edema or cyanosis Skin-warm and dry  Neurological Examination Mental Status: Awake,oriented x 3  Speech is slightly dysarthric. Able to follow 3 step commands without difficulty. Cranial Nerves: II: Left field cut. Pupils are equal, round, and reactive to light.   III,IV, VI: EOMI without ptosis or diploplia. No nystagmus.   V: Facial sensation is decreased on the Left VII: Facial movement is decreased on the Left VIII: hearing is intact to voice X: Uvula elevates symmetrically XI: Shoulder shrug is decreased on the left XII: tongue is midline without atrophy or fasciculations.  Motor: Tone is normal. Bulk is normal. 5/5 strength on the Right.  Dense left hemiplegia and Strength is 1/5 on the Left. Arm and 0/5 Left leg  Sensor: Sensation is decreased on the Left in the arms and legs. Normal on the Right Deep Tendon Reflexes: 1+ and symmetric throughout in the biceps and patellae Plantars: Toes are downgoing bilaterally.  Cerebellar: unable to perform finger-to-nose on Left Gait: Truncal ataxia  to Left   Lab Results: CBC: Recent Labs  Lab 12/16/17 0740 12/17/17 0851 12/18/17 0854  WBC 6.2 4.6 4.6  HGB 9.2* 9.5* 9.9*  HCT 28.0* 29.5* 30.1*  MCV 87.8 89.4 89.1  PLT 229 249 553   Basic Metabolic Panel: Recent Labs  Lab 12/13/17 0300 12/15/17 0757  12/16/17 0744 12/17/17 0851 12/18/17 0854  NA 126* 132*  --  129* 130* 129*  K 5.3* 5.7*  --  6.3* 4.5 4.6  CL 94* 96*  --  95* 92* 93*  CO2 25 24  --  23 25 22   GLUCOSE 122* 92  --  100* 189* 164*  BUN 22* 36*  --  50* 28* 41*  CREATININE 6.12* 6.46*  --  7.88* 5.23* 6.73*  CALCIUM 7.8* 8.6*   < > 8.3* 8.5* 8.3*  MG  --   --   --   --  1.7  --   PHOS  --   --   --  5.0* 5.1* 5.2*   < > = values in this interval not displayed.   Liver Function Tests: Recent Labs  Lab 12/13/17 0300 12/16/17 0744 12/17/17 0851 12/18/17 0854  AST  --   --  20  --   ALT  --   --  13*  --   ALKPHOS 209*  --  253*  --   BILITOT  --   --  0.8  --   PROT  --   --  6.3*  --   ALBUMIN  --  2.8* 3.1* 3.1*   Imaging:  Ct Head Code Stroke Wo Contrast Result Date: 12/07/2017 IMPRESSION:  1. Acute right thalamic hemorrhage. Mild edema and local mass effect.  2. 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  3. New age indeterminate left thalamic lacunar infarct.   Ct Angio Head W Or Wo Contrast Result Date: 12/07/2017 IMPRESSION: 1. No aneurysm, vascular malformation, or spot sign associated with the right thalamic hemorrhage. 2. Patent circle of Willis with intracranial atherosclerosis as above.  Moderate to severe right PCA stenoses.   Ct Head Wo Contrast Result Date: 12/08/2017 IMPRESSION:  1. Stable right thalamic hematoma and small volume of hemorrhage in right lateral ventricle.  2. Mild increase of mass effect with 5 mm right-to-left midline shift, previously 4 mm.  3. No new acute intracranial abnormality identified.   ECHO  12/12/2017 Study Conclusions - Left ventricle: The  cavity size was normal. There was moderate   concentric hypertrophy. Systolic function was normal. The   estimated ejection fraction was in the range of 55% to 60%. Wall   motion was normal; there were no regional wall motion   abnormalities. There was a reduced contribution of atrial   contraction to ventricular filling, due to increased ventricular   diastolic pressure or atrial contractile dysfunction. Doppler   parameters are consistent with a reversible restrictive pattern,   indicative of decreased left ventricular diastolic compliance   and/or increased left atrial pressure (grade 3 diastolic   dysfunction). Doppler parameters are consistent with high   ventricular filling pressure. - Aortic valve: Trileaflet; mildly thickened, mildly calcified   leaflets. Moderate focal calcification involving the left   coronary cusp. - Aorta: Ascending aorta diameter: 40 mm (ED). - Ascending aorta: The ascending aorta was mildly dilated. - Mitral valve: Calcified annulus. There was mild regurgitation. - Left atrium: The atrium was mildly dilated. - Right ventricle: The cavity size was mildly dilated. Wall   thickness was normal. - Tricuspid valve: There was trivial regurgitation  Carotid Duplex           12/11/2017 Final Interpretation: Right Carotid:  The extracranial vessels were near-normal with only minimal wall thickening or plaque. Left Carotid: The extracranial vessels were near-normal with only minimal wall thickening or plaque. Vertebrals: Left vertebral artery was patent with antegrade flow.  Right vertebral artery was not visualized. Subclavians: Normal flow hemodynamics were seen in the right subclavian artery.  Left subclavian artery exhibits monophasic flow, suggestive of possbile proximal obstruction     IMPRESSION: [name] is a 52 y.o. male with PMH of ESRD on Dialysis, HTN, Hx of CVA without deficits, BPH and Depression that presents to the ED as a code  stroke with reports of acute onset Left sided weakness, Left facial droop, decreased sensation on the Left, slightly dysarthric speech. . EMS reports that patient fell out of his bed at approximately 15:40. Patients room mate states that he last saw him normal at 07:00 this morning. CT Head reveals:  Acute right thalamic hemorrhage with mild cytotoxic edema New age indeterminate left thalamic lacunar infarct  Suspected Etiology: HTN hemorrhage and small vessel disease Resultant Symptoms: Left sided weakness, Left facial droop, decreased sensation on Left Stroke Risk Factors: hypertension, ESRD on Dialysis Other Stroke Risk Factors: Advanced age, ?ETOH use, Hx stroke  Outstanding Stroke Work-up Studies:   Work up completed at this time  ASSESSMENT 12/12/2017: Neuro exam remained stable. Increased home dose of Clonidine to 0.2 mg today for some sustained elevated B/P. We will continue to hold aspirin for now due to CT results on 1/7 with midline shift. Awaiting bed offer for SNF admission. Room mate at bedside today and POC, labs, imaging reviewed by Dr Leonie Man  ASSESSMENT 12/14/2017: Patient BP improved but still at high end, currently on Norvasc and clonidine.  Increase clonidine dose again today to 0.3 mg tid.  PT OT recommended SNF.  ASSESSMENT 12/15/2017: Blood pressure trending down slowly. Will continue to monitor closely. Nephrology following. Appreciate assistance. Will check iron studies. HD in AM, +BM today Patient BP improved but still at high end, currently on Norvasc and clonidine. Clonidine dose has been increased to 0.3 mg tid. Will add hydralazine 64m q8h. PT OT recommended SNF. Pending placement.  ASSESSMENT 12/16/2017: Neuro exam stable. Continues with Left Hemiparesis. No family at bedside. Blood pressures slowly trending down. HD completed today. Iron replacement per Nephrology. No more diarrhea reported. Participating in  therapies. Will need to be able to sit in chair x 4 hours per day for d/c to SNF. Plan in progress.  ASSESSMENT 12/17/2017: Neuro exam stable. No change in Left Hemiparesis. Per RN report patient has been able to sit in chair at least 4/hrs per day. Partner at bedside and POC reviewed. Continue to await SNF placement. Blood pressures and Labs remain stable and HD planned for tomorrow.   ASSESSMENT 12/18/2017: No change in neuro exam. Continues to be independent with feeding. Able to tolerate sitting in chair x 4 hours per day. Labs reviewed and HD planned for today. Awaiting SNF placement and arrangements for outpatient HD.  PLAN  12/18/2017: Awaiting bed in SNF for discharge Frequent Neurochecks  Telemetry Monitoring Continue to HOLD Aspirin as patient has a right ICH For worsening mentation, obtain STAT Head CT- and contact Neurology immediately F/U PT/OT/SLP at SNF Ongoing aggressive stroke risk factor management Patient will be counseled to be compliant with his antithrombotic medications Patient will be counseled on Lifestyle modifications including, Diet, Exercise, and Stress Follow up with [facility] in 6 weeks  HX OF STROKES:  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  DYSPHAGIA:  Passed SLP swallow evaluation- Dysphagia 3 diet  Aspiration Precautions in progress  MEDICAL ISSUES:  ESRD on Dialysis - HD in AM (Thursday)  Stable, Nephrology following, Appreciate Assistance  HD today  Creatinine - 6.73  Anemia Chronic  Stable, trending up slowly   Iron Studies in progress  Nephrology to start ESA, IV Iron, Aranesp qHD  Repeat labs in AM  Hyperkalemia Stable - HD today - K+4.5 Repeat labs in AM  Hyponatremia  Stable, Nephrology following  Repeat labs in AM  - Na 129  Hypocalcemia - Secondary Hyperparathyroidism  Stable, Nephrology following   Repeat labs in AM   -  8.3  Elevated Alk Phos  Stable, Nephrology following  12/17/2017 -  253  Elevated Phosphorus Stable, Nephrology following 12/18/2017  -  5.2 Leg Spasms  Zanaflex BID PRN  Diarrhea  Resolved  No further reports per RN   Formed BM 12/12/2017  Imodium PRN  HYPERTENSION:  Stable, slowly trending down, Will continue to monitor closely  SBP goal less than 140/90   Labetolol PRN  Long term BP goal normotensive.  Patient currently on: Norvasc, Clonidine, Hydralazine  HYPERLIPIDEMIA  Home Meds:  NONE  LDL  goal < 70, LDL 73  Will start Lipitor 10 mg  Continue statin at discharge  DIABETES:  HgbA1c goal < 7.0, HgbA1C = 4.0  Currently on:  Novolog  Continue CBG monitoring and SSI to maintain glucose 140-180 mg/dl  DM education   Other Active Problems: Active Problems:   IVH (intraventricular hemorrhage) (HCC)   Dysphagia, post-stroke   Type 2 diabetes mellitus with peripheral neuropathy (HCC)   History of tobacco abuse   Benign essential HTN   ESRD on dialysis ([place])   History of traumatic brain injury   History of CVA with residual deficit   History of transmetatarsal amputation of foot ([place])   ICH (intracerebral hemorrhage) ([place])    Hospital day # 11 VTE prophylaxis: SCD's  Diet - Diet renal/carb modified with fluid restriction Diet-HS Snack? Nothing; Fluid restriction: 1200 mL Fluid; Room service appropriate? Yes; Fluid consistency: Thin   FAMILY UPDATES: No family at bedside  TEAM UPDATES:Tacey Dimaggio, MD   Prior Home Stroke Medications:  No antithrombotic  Discharge Stroke Meds:  Please discharge patient on TBD , will likely add ASA prior to discharge  Disposition: 01-Home or Self Care   Therapy Recs:   12/18/2017, Discussion with MSW,  Discharge to [facility] -Pending set up with outpatient Hemodialysis. Patient has been able to sit up in chair x 4 hours per day per nursing. No new updates on possibility to discharge  Home Equipment:         TBD Follow Up:  Follow-up Information    Garvin Fila, MD.  Schedule an appointment as soon as possible for a visit in 6 week(s).   Specialties:  Neurology, Radiology Contact information: [address] [place] [address] [place], [place], [place] -PCP Follow up in 1-2 weeks         Renie Ora Neurology Stroke Team 12/18/2017, 12:36 PM   ATTENDING NOTE: I reviewed above note and agree with the assessment and plan. I have made any additions or clarifications directly to the above note. Pt was seen and examined.   Patient BPon the low end, d/c clonidine, now on Norvasc10and andhydralazine 52m q8h.still pending placement.will have HD today. No acute event overnight. Still has left hemiplegia. working with PT/OT.  JRosalin Hawking MD PhD Stroke Neurology 12/18/2017 4:23 PM    Please page stroke NP  Or  PA  Or MD from 8am -4 pm  as this patient from this time will be  followed by the stroke.   You can look them up on [email or web address]  Password [email or web address]

## 2017-12-18 NOTE — Progress Notes (Signed)
CSW following for discharge planning. CSW coordinated with Shelia in dialysis on working towards getting patient clipped in [place] for SNF. Per Silvio Pate, facility is not one that she's able to coordinate with; will need to provide information to CSW on how to send referral to outpatient dialysis facility.  CSW will continue to follow.  Blenda Nicely, [place] Clinical Social Worker [phone]

## 2017-12-18 NOTE — Progress Notes (Signed)
Pt transported to HD.

## 2017-12-19 LAB — GLUCOSE, CAPILLARY
GLUCOSE-CAPILLARY: 107 mg/dL — AB (ref 65–99)
Glucose-Capillary: 114 mg/dL — ABNORMAL HIGH (ref 65–99)
Glucose-Capillary: 126 mg/dL — ABNORMAL HIGH (ref 65–99)
Glucose-Capillary: 105 mg/dL — ABNORMAL HIGH (ref 65–99)

## 2017-12-19 MED ORDER — HYDRALAZINE HCL 50 MG PO TABS
50.0000 mg | ORAL_TABLET | Freq: Three times a day (TID) | ORAL | Status: DC
  Administered 2017-12-19 – 2017-12-26 (×18): 50 mg via ORAL
  Filled 2017-12-19 (×18): qty 1

## 2017-12-19 NOTE — Progress Notes (Signed)
CSW following for discharge planning. CSW received information from [place] to fax referral to central intake for [facility] to transition the patient to dialysis in [place]. CSW provided information via fax, and will follow up to make sure referral was received.  CSW will continue to follow.  Blenda Nicely, [place] Clinical Social Worker [phone]

## 2017-12-19 NOTE — Progress Notes (Signed)
Pt in chair from 6p to 10p. Tolerated well.

## 2017-12-19 NOTE — Progress Notes (Signed)
Physical Therapy Treatment Patient Details Name: Wesley Burns MRN: [id number] DOB: [birth year] Today's Date: 12/19/2017    History of Present Illness 52 y.o. male with ESRD, Type 2 DM, HTN, GERD, and Hx of Right basal ganglia CVA (2017), right transmet amputation. Admitted with right thalamic hemorrhagic stroke.    PT Comments    Pt continues to make progress towards his goals however is limited by L sided weakness and decreased safety awareness. Pt is currently mod A for bed mobility and modAx2 for lateral scoot transfer from dropped arm recliner to bed. Pt also able to participate in UE weightbearing activity. D/c plans remain appropriate at this time. PT will continue to follow acutely until d/c.    Follow Up Recommendations  Supervision/Assistance - 24 hour;SNF     Equipment Recommendations  Other (comment)(TBD)    Recommendations for Other Services       Precautions / Restrictions Precautions Precautions: Fall Restrictions Weight Bearing Restrictions: No    Mobility  Bed Mobility Overal bed mobility: Needs Assistance Bed Mobility: Supine to Sit Rolling: Min assist     Sit to supine: Mod assist   General bed mobility comments: mod A for L LE mangement into bed, vc for coming down on L side  Transfers Overall transfer level: Needs assistance Equipment used: 2 person hand held assist Transfers: Lateral/Scoot Transfers          Lateral/Scoot Transfers: Mod assist;+2 physical assistance General transfer comment: mod Ax2 for blocking R knee and assisting in push to L, able to transfer from recliner to bed n 5 scoots  Ambulation/Gait             General Gait Details: unsafe to attempt       Balance Overall balance assessment: Needs assistance Sitting-balance support: Single extremity supported;Feet supported Sitting balance-Leahy Scale: Fair Sitting balance - Comments: Pt able to sit in chair without support for short periods of time. Pt able to  tolerate only very slight changes and sometimes results in LOB.                                    Cognition Arousal/Alertness: Awake/alert Behavior During Therapy: WFL for tasks assessed/performed                                   General Comments: pt with good command following when focused on tasked at hand, easily distracted      Exercises Other Exercises Other Exercises: seated weight-bearing through LUE with assist to block elbow flexion, for 10 seconds and then using core muscle to right himself x 5         Pertinent Vitals/Pain Pain Assessment: No/denies pain           PT Goals (current goals can now be found in the care plan section) Acute Rehab PT Goals Patient Stated Goal: return home, read PT Goal Formulation: With patient Time For Goal Achievement: 12/22/17 Potential to Achieve Goals: Good Progress towards PT goals: Progressing toward goals    Frequency    Min 3X/week      PT Plan Current plan remains appropriate       AM-PAC PT "6 Clicks" Daily Activity  Outcome Measure  Difficulty turning over in bed (including adjusting bedclothes, sheets and blankets)?: Unable Difficulty moving from lying on back to sitting on the side  of the bed? : Unable Difficulty sitting down on and standing up from a chair with arms (e.g., wheelchair, bedside commode, etc,.)?: Unable Help needed moving to and from a bed to chair (including a wheelchair)?: Total Help needed walking in hospital room?: Total Help needed climbing 3-5 steps with a railing? : Total 6 Click Score: 6    End of Session Equipment Utilized During Treatment: Gait belt Activity Tolerance: Patient tolerated treatment well Patient left: in bed;with call bell/phone within reach;with bed alarm set;with nursing/sitter in room;with family/visitor present Nurse Communication: Mobility status PT Visit Diagnosis: Unsteadiness on feet (R26.81);Other abnormalities of gait and  mobility (R26.89);Hemiplegia and hemiparesis Hemiplegia - Right/Left: Left Hemiplegia - dominant/non-dominant: Non-dominant Hemiplegia - caused by: Other Nontraumatic intracranial hemorrhage     Time: 9323-5573 PT Time Calculation (min) (ACUTE ONLY): 19 min  Charges:  $Therapeutic Activity: 8-22 mins                    G Codes:       Mikias Lanz B. Beverely Risen PT, DPT Acute Rehabilitation  [phone] Pager [phone]     Elon Alas Fleet 12/19/2017, 4:50 PM

## 2017-12-19 NOTE — Progress Notes (Signed)
Subjective: Interval History: none. and has complaints still cannot move L side.  Objective: Vital signs in last 24 hours: Temp:  [97.8 F (36.6 C)-99.2 F (37.3 C)] 99.2 F (37.3 C) (01/18 0510) Pulse Rate:  [56-63] 60 (01/18 0510) Resp:  [16-18] 18 (01/18 0510) BP: (119-155)/(61-82) 143/61 (01/18 0510) SpO2:  [98 %-100 %] 98 % (01/18 0510) Weight:  [64.3 kg (141 lb 12.1 oz)-68.2 kg (150 lb 5.7 oz)] 64.3 kg (141 lb 12.1 oz) (01/17 1622) Weight change:   Intake/Output from previous day: 01/17 0701 - 01/18 0700 In: 240 [P.O.:240] Out: 3500  Intake/Output this shift: No intake/output data recorded.  General appearance: alert, cooperative, no distress and pale Resp: clear to auscultation bilaterally Cardio: S1, S2 normal and systolic murmur: systolic ejection 2/6, decrescendo at 2nd left intercostal space GI: soft, pos bs, liver down 5 cm Extremities: AVF LLA B&T  Lab Results: Recent Labs    12/17/17 0851 12/18/17 0854  WBC 4.6 4.6  HGB 9.5* 9.9*  HCT 29.5* 30.1*  PLT 249 244   BMET:  Recent Labs    12/17/17 0851 12/18/17 0854  NA 130* 129*  K 4.5 4.6  CL 92* 93*  CO2 25 22  GLUCOSE 189* 164*  BUN 28* 41*  CREATININE 5.23* 6.73*  CALCIUM 8.5* 8.3*   No results for input(s): PTH in the last 72 hours. Iron Studies: No results for input(s): IRON, TIBC, TRANSFERRIN, FERRITIN in the last 72 hours.  Studies/Results: No results found.  I have reviewed the patient's current medications.  Assessment/Plan: 1 ESRD HD yest, tomorrow. Stable 2 HTN better after HD 3 Anemia improving 4 HPTH vit D 6 liver dz 7 CVA 8 NONADHERENCE P HD, esa, bp control, NHP    LOS: 12 days   Wesley Burns 12/19/2017,9:34 AM

## 2017-12-19 NOTE — Progress Notes (Signed)
NEUROHOSPITALISTS - STROKE TEAM DAILY PROGRESS NOTE   SUBJECTIVE No family at the bedside. Patient is alert, sitting up in chair. Talking and interactive with examiner. No new/acute events reported overnight. HD yesterday and tomorrow. Pending placement to SNF.  Physical Examination: Vitals:   12/18/17 1622 12/18/17 2114 12/19/17 0045 12/19/17 0510  BP: (!) 141/74 (!) 144/64 140/71 (!) 143/61  Pulse: 60 (!) 57 60 60  Resp: _0 Temp: 97.8 F (36.6 C) 99.2 F (37.3 C) 99 F (37.2 C) 99.2 F (37.3 C)  TempSrc: Oral Oral Oral Oral  SpO2: 98% 100% 100% 98%  Weight: 141 lb 12.1 oz (64.3 kg)     Height:       HEENT-  Normocephalic,  Cardiovascular - Regular rate and rhythm, + murmur Respiratory - Lungs clear bilaterally. Non-labored breathing, No wheezing. Abdomen - soft and non-tender, BS normal Extremities- no edema or cyanosis Skin-warm and dry  Neurological Examination Mental Status: Awake,oriented x 3  Speech is slightly dysarthric. Able to follow 3 step commands without difficulty. Cranial Nerves: II: Left field cut. Pupils are equal, round, and reactive to light.   III,IV, VI: EOMI without ptosis or diploplia. No nystagmus.   V: Facial sensation is decreased on the Left VII: Facial movement is decreased on the Left VIII: hearing is intact to voice X: Uvula elevates symmetrically XI: Shoulder shrug is decreased on the left XII: tongue is midline without atrophy or fasciculations.  Motor: Tone is normal. Bulk is normal. 5/5 strength on the Right.  Dense left hemiplegia and Strength is 1/5 on the Left. Arm and 0/5 Left leg  Sensor: Sensation is decreased on the Left in the arms and legs. Normal on the Right Deep Tendon Reflexes: 1+ and symmetric throughout in the biceps and patellae Plantars: Toes are downgoing bilaterally.  Cerebellar: unable to perform finger-to-nose on Left Gait: Truncal ataxia to Left   Lab Results: CBC: Recent Labs   Lab 12/16/17 0740 12/17/17 0851 12/18/17 0854  WBC 6.2 4.6 4.6  HGB 9.2* 9.5* 9.9*  HCT 28.0* 29.5* 30.1*  MCV 87.8 89.4 89.1  PLT 229 249 681   Basic Metabolic Panel: Recent Labs  Lab 12/13/17 0300 12/15/17 0757  12/16/17 0744 12/17/17 0851 12/18/17 0854  NA 126* 132*  --  129* 130* 129*  K 5.3* 5.7*  --  6.3* 4.5 4.6  CL 94* 96*  --  95* 92* 93*  CO2 25 24  --  _1 GLUCOSE 122* 92  --  100* 189* 164*  BUN 22* 36*  --  50* 28* 41*  CREATININE 6.12* 6.46*  --  7.88* 5.23* 6.73*  CALCIUM 7.8* 8.6*   < > 8.3* 8.5* 8.3*  MG  --   --   --   --  1.7  --   PHOS  --   --   --  5.0* 5.1* 5.2*   < > = values in this interval not displayed.   Liver Function Tests: Recent Labs  Lab 12/13/17 0300 12/16/17 0744 12/17/17 0851 12/18/17 0854  AST  --   --  20  --   ALT  --   --  13*  --   ALKPHOS 209*  --  253*  --   BILITOT  --   --  0.8  --   PROT  --   --  6.3*  --   ALBUMIN  --  2.8* 3.1* 3.1*   Imaging:  Ct  Head Code Stroke Wo Contrast Result Date: 12/07/2017 IMPRESSION:  1. Acute right thalamic hemorrhage. Mild edema and local mass effect.  2. 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  3. New age indeterminate left thalamic lacunar infarct.   Ct Angio Head W Or Wo Contrast Result Date: 12/07/2017 IMPRESSION: 1. No aneurysm, vascular malformation, or spot sign associated with the right thalamic hemorrhage. 2. Patent circle of Willis with intracranial atherosclerosis as above.  Moderate to severe right PCA stenoses.   Ct Head Wo Contrast Result Date: 12/08/2017 IMPRESSION:  1. Stable right thalamic hematoma and small volume of hemorrhage in right lateral ventricle.  2. Mild increase of mass effect with 5 mm right-to-left midline shift, previously 4 mm.  3. No new acute intracranial abnormality identified.   ECHO  12/12/2017 Study Conclusions - Left ventricle: The cavity size was normal. There was moderate    concentric hypertrophy. Systolic function was normal. The   estimated ejection fraction was in the range of 55% to 60%. Wall   motion was normal; there were no regional wall motion   abnormalities. There was a reduced contribution of atrial   contraction to ventricular filling, due to increased ventricular   diastolic pressure or atrial contractile dysfunction. Doppler   parameters are consistent with a reversible restrictive pattern,   indicative of decreased left ventricular diastolic compliance   and/or increased left atrial pressure (grade 3 diastolic   dysfunction). Doppler parameters are consistent with high   ventricular filling pressure. - Aortic valve: Trileaflet; mildly thickened, mildly calcified   leaflets. Moderate focal calcification involving the left   coronary cusp. - Aorta: Ascending aorta diameter: 40 mm (ED). - Ascending aorta: The ascending aorta was mildly dilated. - Mitral valve: Calcified annulus. There was mild regurgitation. - Left atrium: The atrium was mildly dilated. - Right ventricle: The cavity size was mildly dilated. Wall   thickness was normal. - Tricuspid valve: There was trivial regurgitation  Carotid Duplex           12/11/2017 Final Interpretation: Right Carotid:  The extracranial vessels were near-normal with only minimal wall thickening or plaque. Left Carotid: The extracranial vessels were near-normal with only minimal wall thickening or plaque. Vertebrals: Left vertebral artery was patent with antegrade flow.  Right vertebral artery was not visualized. Subclavians: Normal flow hemodynamics were seen in the right subclavian artery.  Left subclavian artery exhibits monophasic flow, suggestive of possbile proximal obstruction     IMPRESSION: Wesley Burns is a 52 y.o. male with PMH of ESRD on Dialysis, HTN, Hx of CVA without deficits, BPH and Depression that presents to the ED as a code stroke with reports of acute onset Left sided  weakness, Left facial droop, decreased sensation on the Left, slightly dysarthric speech. . EMS reports that patient fell out of his bed at approximately 15:40. Patients room mate states that he last saw him normal at 07:00 this morning. CT Head reveals:  Acute right thalamic hemorrhage with mild cytotoxic edema New age indeterminate left thalamic lacunar infarct  Suspected Etiology: HTN hemorrhage and small vessel disease Resultant Symptoms: Left sided weakness, Left facial droop, decreased sensation on Left Stroke Risk Factors: hypertension, ESRD on Dialysis Other Stroke Risk Factors: Advanced age, ?ETOH use, Hx stroke  Outstanding Stroke Work-up Studies:   Work up completed at this time  PLAN  12/19/2017: Awaiting bed in SNF for discharge Frequent Neurochecks  Telemetry Monitoring Continue to HOLD Aspirin as patient has  a right ICH For worsening mentation, obtain STAT Head CT- and contact Neurology immediately F/U PT/OT/SLP at SNF Ongoing aggressive stroke risk factor management Patient will be counseled to be compliant with his antithrombotic medications Patient will be counseled on Lifestyle modifications including, Diet, Exercise, and Stress Follow up with [facility] in 6 weeks  HX OF STROKES:  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  DYSPHAGIA:  Passed SLP swallow evaluation- Dysphagia 3 diet  Aspiration Precautions in progress  MEDICAL ISSUES:  ESRD on Dialysis - HD in AM (Thursday)  Stable, Nephrology following, Appreciate Assistance  HD today  Creatinine - 6.73  Anemia Chronic  Stable, trending up slowly   Iron Studies in progress  Nephrology to start ESA, IV Iron, Aranesp qHD  Repeat labs in AM  Hyperkalemia Stable - HD today - K+4.5 Repeat labs in AM  Hyponatremia  Stable, Nephrology following  Repeat labs in AM  - Na 129  Hypocalcemia - Secondary Hyperparathyroidism  Stable, Nephrology following   Repeat labs in AM   -   8.3  Elevated Alk Phos  Stable, Nephrology following  12/17/2017 - 253  Elevated Phosphorus Stable, Nephrology following 12/18/2017  -  5.2 Leg Spasms  Zanaflex BID PRN  Diarrhea  Resolved  No further reports per RN   Formed BM 12/12/2017  Imodium PRN  HYPERTENSION:  Stable, slowly trending down, Will continue to monitor closely  SBP goal less than 140/90   Labetolol PRN  Long term BP goal normotensive.  Patient currently on: Norvasc, Clonidine, Hydralazine  HYPERLIPIDEMIA  Home Meds:  NONE  LDL  goal < 70, LDL 73  Will start Lipitor 10 mg  Continue statin at discharge  DIABETES:  HgbA1c goal < 7.0, HgbA1C = 4.0  Currently on:  Novolog  Continue CBG monitoring and SSI to maintain glucose 140-180 mg/dl  DM education   Other Active Problems: Active Problems:   IVH (intraventricular hemorrhage) (HCC)   Dysphagia, post-stroke   Type 2 diabetes mellitus with peripheral neuropathy (HCC)   History of tobacco abuse   Benign essential HTN   ESRD on dialysis ([place])   History of traumatic brain injury   History of CVA with residual deficit   History of transmetatarsal amputation of foot ([place])   ICH (intracerebral hemorrhage) ([place])    Hospital day # 12 VTE prophylaxis: SCD's  Diet - Diet renal/carb modified with fluid restriction Diet-HS Snack? Nothing; Fluid restriction: 1200 mL Fluid; Room service appropriate? Yes; Fluid consistency: Thin   FAMILY UPDATES: No family at bedside  TEAM UPDATES:Xu, Jindong, MD   Prior Home Stroke Medications:  No antithrombotic  Discharge Stroke Meds:  Please discharge patient on TBD , will likely add ASA prior to discharge  Disposition: 01-Home or Self Care   Therapy Recs:   12/18/2017, Discussion with MSW,  Discharge to [facility] -Pending set up with outpatient Hemodialysis. Patient has been able to sit up in chair x 4 hours per day per nursing. No new updates on possibility to discharge  Home Equipment:          TBD Follow Up:  Follow-up Information    Garvin Fila, MD. Schedule an appointment as soon as possible for a visit in 6 week(s).   Specialties:  Neurology, Radiology Contact information: [address] [place] [address] [place], [place], MD -PCP Follow up in 1-2 weeks  ATTENDING NOTE: I reviewed above note and agree with the assessment and plan. I have made any additions or clarifications directly to the above note. Pt was seen and examined.   Patient BPon the high end, now on Norvasc10and andhydralazine 29m q8h.will increase to hydralazine 543mtid. still pending placement.will have HD tomorrow. No acute event overnight. Still has left hemiplegia. working with PT/OT. Pt needs to be able to sit more than 4 hours a day before SNF acceptance.   JiRosalin HawkingMD PhD Stroke Neurology 12/19/2017 5:13 PM  Please page stroke NP  Or  PA  Or MD from 8am -4 pm  as this patient from this time will be  followed by the stroke.   You can look them up on [email or web address]  Password [email or web address]

## 2017-12-19 NOTE — Progress Notes (Signed)
Occupational Therapy Treatment Patient Details Name: Wesley Burns MRN: [id number] DOB: 24-Aug-1966 Today's Date: 12/19/2017    History of present illness 52 y.o. male with ESRD, Type 2 DM, HTN, GERD, and Hx of Right basal ganglia CVA (2017), right transmet amputation. Admitted with right thalamic hemorrhagic stroke.   OT comments  While EOB worked on Facilitation of trunk control with reaching activities while maintaining Lt UE as a stabilizer with min facilitation.  He loses his balance posteriorly when reaching to Lt off his base of support.  He was able to perform scoot transfer to his Lt to the drop arm recliner with mod A with facilitation for hip flexion and trunk extension/neutral pelvis.  He is making nice progress toward goals.   Follow Up Recommendations  SNF    Equipment Recommendations  None recommended by OT    Recommendations for Other Services      Precautions / Restrictions Precautions Precautions: Fall Precaution Comments: left foot ulcer Restrictions Weight Bearing Restrictions: No       Mobility Bed Mobility Overal bed mobility: Needs Assistance Bed Mobility: Supine to Sit     Supine to sit: Mod assist     General bed mobility comments: assist to move/guide LEs off bed and to lift trunk   Transfers Overall transfer level: Needs assistance   Transfers: Lateral/Scoot Transfers          Lateral/Scoot Transfers: Mod assist General transfer comment: Pt performed scoot transfer to the Left with facilitation for hip flexion with trunk in neutral     Balance Overall balance assessment: Needs assistance Sitting-balance support: Single extremity supported;Feet supported Sitting balance-Leahy Scale: Fair Sitting balance - Comments: Pt able to maintain EOB sitting statically with close min guard assist.  When challenged to shift off his BOS, he lost balance posteriorly requiring mod A to correct                                    ADL  either performed or assessed with clinical judgement   ADL Overall ADL's : Needs assistance/impaired     Grooming: Wash/dry hands;Wash/dry face;Oral care;Brushing hair;Minimal assistance;Standing                               Functional mobility during ADLs: Moderate assistance(scoot transfer )       Vision       Perception     Praxis      Cognition Arousal/Alertness: Awake/alert Behavior During Therapy: WFL for tasks assessed/performed Overall Cognitive Status: Impaired/Different from baseline Area of Impairment: Attention;Following commands;Safety/judgement;Awareness;Problem solving                   Current Attention Level: Selective   Following Commands: Follows multi-step commands inconsistently Safety/Judgement: Decreased awareness of safety Awareness: Intellectual Problem Solving: Slow processing;Decreased initiation;Difficulty sequencing;Requires verbal cues;Requires tactile cues          Exercises Other Exercises Other Exercises: worked on faciltation of trunk control with reaching with Rt UE in all planes.  He loses balance when reaching to Lt  Other Exercises: Pt able to use Lt UE as a stabilizier.  Worked on facilitation of Lt UE movement during weight bearing activities and facilitation of small excursion shoulder flex/ext    Shoulder Instructions       General Comments      Pertinent Vitals/ Pain  Home Living                                          Prior Functioning/Environment              Frequency  Min 2X/week        Progress Toward Goals  OT Goals(current goals can now be found in the care plan section)  Progress towards OT goals: Progressing toward goals     Plan Discharge plan remains appropriate    Co-evaluation                 AM-PAC PT "6 Clicks" Daily Activity     Outcome Measure   Help from another person eating meals?: A Little Help from another person  taking care of personal grooming?: A Little Help from another person toileting, which includes using toliet, bedpan, or urinal?: A Lot Help from another person bathing (including washing, rinsing, drying)?: A Lot Help from another person to put on and taking off regular upper body clothing?: A Lot Help from another person to put on and taking off regular lower body clothing?: A Lot 6 Click Score: 14    End of Session    OT Visit Diagnosis: Unsteadiness on feet (R26.81);Muscle weakness (generalized) (M62.81);Hemiplegia and hemiparesis Hemiplegia - Right/Left: Left Hemiplegia - dominant/non-dominant: Non-Dominant Hemiplegia - caused by: Cerebral infarction   Activity Tolerance Patient tolerated treatment well   Patient Left in chair;with call bell/phone within reach;with chair alarm set   Nurse Communication Mobility status        Time: 0623-7628 OT Time Calculation (min): 38 min  Charges: OT General Charges $OT Visit: 1 Visit OT Treatments $Neuromuscular Re-education: 38-52 mins  [employer], OTR/L [phone]    Jeani Hawking M 12/19/2017, 10:12 PM

## 2017-12-20 LAB — GLUCOSE, CAPILLARY
GLUCOSE-CAPILLARY: 94 mg/dL (ref 65–99)
Glucose-Capillary: 142 mg/dL — ABNORMAL HIGH (ref 65–99)

## 2017-12-20 LAB — RENAL FUNCTION PANEL
Albumin: 3.2 g/dL — ABNORMAL LOW (ref 3.5–5.0)
Anion gap: 13 (ref 5–15)
BUN: 34 mg/dL — ABNORMAL HIGH (ref 6–20)
CHLORIDE: 94 mmol/L — AB (ref 101–111)
CO2: 22 mmol/L (ref 22–32)
CREATININE: 6.14 mg/dL — AB (ref 0.61–1.24)
Calcium: 8.9 mg/dL (ref 8.9–10.3)
GFR calc non Af Amer: 9 mL/min — ABNORMAL LOW (ref >60)
GFR, EST AFRICAN AMERICAN: 11 mL/min — AB (ref >60)
GLUCOSE: 111 mg/dL — AB (ref 65–99)
Phosphorus: 4.5 mg/dL (ref 2.5–4.6)
Potassium: 4.5 mmol/L (ref 3.5–5.1)
SODIUM: 129 mmol/L — AB (ref 135–145)

## 2017-12-20 LAB — CBC
HCT: 30.9 % — ABNORMAL LOW (ref 39.0–52.0)
Hemoglobin: 10 g/dL — ABNORMAL LOW (ref 13.0–17.0)
MCH: 29.5 pg (ref 26.0–34.0)
MCHC: 32.4 g/dL (ref 30.0–36.0)
MCV: 91.2 fL (ref 78.0–100.0)
PLATELETS: 298 10*3/uL (ref 150–400)
RBC: 3.39 MIL/uL — AB (ref 4.22–5.81)
RDW: 18.3 % — ABNORMAL HIGH (ref 11.5–15.5)
WBC: 6.3 10*3/uL (ref 4.0–10.5)

## 2017-12-20 NOTE — Progress Notes (Signed)
Subjective: Interval History: has no complaint , still no L side movement.  Objective: Vital signs in last 24 hours: Temp:  [97.8 F (36.6 C)-100.5 F (38.1 C)] 99 F (37.2 C) (01/19 0600) Pulse Rate:  [61-70] 68 (01/19 0600) Resp:  [16-18] 18 (01/19 0600) BP: (130-163)/(62-75) 149/65 (01/19 0600) SpO2:  [98 %-100 %] 98 % (01/19 0600) Weight change:   Intake/Output from previous day: 01/18 0701 - 01/19 0700 In: 1240 [P.O.:1240] Out: -  Intake/Output this shift: No intake/output data recorded.  General appearance: alert, cooperative, no distress and pale Resp: clear to auscultation bilaterally Cardio: S1, S2 normal and systolic murmur: systolic ejection 2/6, decrescendo at 2nd left intercostal space GI: soft, non-tender; bowel sounds normal; no masses,  no organomegaly Extremities: AVF LLA   Lab Results: Recent Labs    12/18/17 0854  WBC 4.6  HGB 9.9*  HCT 30.1*  PLT 244   BMET:  Recent Labs    12/18/17 0854  NA 129*  K 4.6  CL 93*  CO2 22  GLUCOSE 164*  BUN 41*  CREATININE 6.73*  CALCIUM 8.3*   No results for input(s): PTH in the last 72 hours. Iron Studies: No results for input(s): IRON, TIBC, TRANSFERRIN, FERRITIN in the last 72 hours.  Studies/Results: No results found.  I have reviewed the patient's current medications.  Assessment/Plan: 1 ESRD for HD 2 HTN lower with lower vol 3 Anemia esa,  4 HPTH vit D 5 DM controlled 6 CVA 7 NONADHERENCE P HD, NHP,  Esa, control bs and bp    LOS: 13 days   Jalil Lorusso 12/20/2017,9:38 AM

## 2017-12-20 NOTE — Plan of Care (Signed)
D/W pt inpt HD trmt procedures.

## 2017-12-20 NOTE — Procedures (Signed)
I was present at this session.  I have reviewed the session itself and made appropriate changes.  Hd via LLA AVF. Access prss ok . bp 120s to 130s  Fayrene Fearing Ameenah Prosser 1/19/201912:15 PM

## 2017-12-20 NOTE — Progress Notes (Signed)
NEUROHOSPITALISTS - STROKE TEAM DAILY PROGRESS NOTE   SUBJECTIVE No family at the bedside. Patient i is currently in hemodialysis and s alert, sitting up in chair. Talking and interactive with examiner. No new/acute events reported overnight.   Pending placement to SNF.  Physical Examination: Vitals:   12/20/17 1053 12/20/17 1100 12/20/17 1130 12/20/17 1200  BP: (!) 153/74 (!) 155/76 130/63 124/66  Pulse: 64 64 61 64  Resp: _0 Temp:      TempSrc:      SpO2:      Weight:      Height:       HEENT-  Normocephalic,  Cardiovascular - Regular rate and rhythm, + murmur Respiratory - Lungs clear bilaterally. Non-labored breathing, No wheezing. Abdomen - soft and non-tender, BS normal Extremities- no edema or cyanosis Skin-warm and dry  Neurological Examination Mental Status: Awake,oriented x 3  Speech is slightly dysarthric. Able to follow 3 step commands without difficulty. Cranial Nerves: II: Left field cut. Pupils are equal, round, and reactive to light.   III,IV, VI: EOMI without ptosis or diploplia. No nystagmus.   V: Facial sensation is decreased on the Left VII: Facial movement is decreased on the Left VIII: hearing is intact to voice X: Uvula elevates symmetrically XI: Shoulder shrug is decreased on the left XII: tongue is midline without atrophy or fasciculations.  Motor: Tone is normal. Bulk is normal. 5/5 strength on the Right.  Dense left hemiplegia and Strength is 1/5 on the Left. Arm and 0/5 Left leg  Sensor: Sensation is decreased on the Left in the arms and legs. Normal on the Right Deep Tendon Reflexes: 1+ and symmetric throughout in the biceps and patellae Plantars: Toes are downgoing bilaterally.  Cerebellar: unable to perform finger-to-nose on Left Gait: Truncal ataxia to Left   Lab Results: CBC: Recent Labs  Lab 12/17/17 0851 12/18/17 0854 12/20/17 1109  WBC 4.6 4.6 6.3  HGB 9.5* 9.9* 10.0*  HCT 29.5* 30.1* 30.9*  MCV  89.4 89.1 91.2  PLT 249 244 100   Basic Metabolic Panel: Recent Labs  Lab 12/15/17 0757  12/16/17 0744 12/17/17 0851 12/18/17 0854 12/20/17 1109  NA 132*  --  129* 130* 129* 129*  K 5.7*  --  6.3* 4.5 4.6 4.5  CL 96*  --  95* 92* 93* 94*  CO2 24  --  _1 GLUCOSE 92  --  100* 189* 164* 111*  BUN 36*  --  50* 28* 41* 34*  CREATININE 6.46*  --  7.88* 5.23* 6.73* 6.14*  CALCIUM 8.6*   < > 8.3* 8.5* 8.3* 8.9  MG  --   --   --  1.7  --   --   PHOS  --   --  5.0* 5.1* 5.2* 4.5   < > = values in this interval not displayed.   Liver Function Tests: Recent Labs  Lab 12/16/17 0744 12/17/17 0851 12/18/17 0854 12/20/17 1109  AST  --  20  --   --   ALT  --  13*  --   --   ALKPHOS  --  253*  --   --   BILITOT  --  0.8  --   --   PROT  --  6.3*  --   --   ALBUMIN 2.8* 3.1* 3.1* 3.2*   Imaging:  Ct Head Code Stroke Wo Contrast Result Date: 12/07/2017 IMPRESSION:  1. Acute right thalamic hemorrhage. Mild edema  and local mass effect.  2. 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  3. New age indeterminate left thalamic lacunar infarct.   Ct Angio Head W Or Wo Contrast Result Date: 12/07/2017 IMPRESSION: 1. No aneurysm, vascular malformation, or spot sign associated with the right thalamic hemorrhage. 2. Patent circle of Willis with intracranial atherosclerosis as above.  Moderate to severe right PCA stenoses.   Ct Head Wo Contrast Result Date: 12/08/2017 IMPRESSION:  1. Stable right thalamic hematoma and small volume of hemorrhage in right lateral ventricle.  2. Mild increase of mass effect with 5 mm right-to-left midline shift, previously 4 mm.  3. No new acute intracranial abnormality identified.   ECHO  12/12/2017 Study Conclusions - Left ventricle: The cavity size was normal. There was moderate   concentric hypertrophy. Systolic function was normal. The   estimated ejection fraction was in the range of 55% to 60%.  Wall   motion was normal; there were no regional wall motion   abnormalities. There was a reduced contribution of atrial   contraction to ventricular filling, due to increased ventricular   diastolic pressure or atrial contractile dysfunction. Doppler   parameters are consistent with a reversible restrictive pattern,   indicative of decreased left ventricular diastolic compliance   and/or increased left atrial pressure (grade 3 diastolic   dysfunction). Doppler parameters are consistent with high   ventricular filling pressure. - Aortic valve: Trileaflet; mildly thickened, mildly calcified   leaflets. Moderate focal calcification involving the left   coronary cusp. - Aorta: Ascending aorta diameter: 40 mm (ED). - Ascending aorta: The ascending aorta was mildly dilated. - Mitral valve: Calcified annulus. There was mild regurgitation. - Left atrium: The atrium was mildly dilated. - Right ventricle: The cavity size was mildly dilated. Wall   thickness was normal. - Tricuspid valve: There was trivial regurgitation  Carotid Duplex           12/11/2017 Final Interpretation: Right Carotid:  The extracranial vessels were near-normal with only minimal wall thickening or plaque. Left Carotid: The extracranial vessels were near-normal with only minimal wall thickening or plaque. Vertebrals: Left vertebral artery was patent with antegrade flow.  Right vertebral artery was not visualized. Subclavians: Normal flow hemodynamics were seen in the right subclavian artery.  Left subclavian artery exhibits monophasic flow, suggestive of possbile proximal obstruction     IMPRESSION: [name] is a 52 y.o. male with PMH of ESRD on Dialysis, HTN, Hx of CVA without deficits, BPH and Depression that presents to the ED as a code stroke with reports of acute onset Left sided weakness, Left facial droop, decreased sensation on the Left, slightly dysarthric speech. . EMS reports that patient fell out  of his bed at approximately 15:40. Patients room mate states that he last saw him normal at 07:00 this morning. CT Head reveals:  Acute right thalamic hemorrhage with mild cytotoxic edema New age indeterminate left thalamic lacunar infarct  Suspected Etiology: HTN hemorrhage and small vessel disease Resultant Symptoms: Left sided weakness, Left facial droop, decreased sensation on Left Stroke Risk Factors: hypertension, ESRD on Dialysis Other Stroke Risk Factors: Advanced age, ?ETOH use, Hx stroke  Outstanding Stroke Work-up Studies:   Work up completed at this time  PLAN  12/20/2017: Awaiting bed in SNF for discharge Frequent Neurochecks  Telemetry Monitoring Continue to HOLD Aspirin as patient has a right ICH For worsening mentation, obtain STAT Head CT- and contact Neurology immediately F/U PT/OT/SLP at  SNF Ongoing aggressive stroke risk factor management Patient will be counseled to be compliant with his antithrombotic medications Patient will be counseled on Lifestyle modifications including, Diet, Exercise, and Stress Follow up with [facility] in 6 weeks  HX OF STROKES:  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  DYSPHAGIA:  Passed SLP swallow evaluation- Dysphagia 3 diet  Aspiration Precautions in progress  MEDICAL ISSUES:  ESRD on Dialysis - HD in AM (Thursday)  Stable, Nephrology following, Appreciate Assistance  HD today  Creatinine - 6.73  Anemia Chronic  Stable, trending up slowly   Iron Studies in progress  Nephrology to start ESA, IV Iron, Aranesp qHD  Repeat labs in AM  Hyperkalemia Stable - HD today - K+4.5 Repeat labs in AM  Hyponatremia  Stable, Nephrology following  Repeat labs in AM  - Na 129  Hypocalcemia - Secondary Hyperparathyroidism  Stable, Nephrology following   Repeat labs in AM   -  8.3  Elevated Alk Phos  Stable, Nephrology following  12/17/2017 - 253  Elevated Phosphorus Stable, Nephrology  following 12/18/2017  -  5.2 Leg Spasms  Zanaflex BID PRN  Diarrhea  Resolved  No further reports per RN   Formed BM 12/12/2017  Imodium PRN  HYPERTENSION:  Stable, slowly trending down, Will continue to monitor closely  SBP goal less than 140/90   Labetolol PRN  Long term BP goal normotensive.  Patient currently on: Norvasc, Clonidine, Hydralazine  HYPERLIPIDEMIA  Home Meds:  NONE  LDL  goal < 70, LDL 73  Will start Lipitor 10 mg  Continue statin at discharge  DIABETES:  HgbA1c goal < 7.0, HgbA1C = 4.0  Currently on:  Novolog  Continue CBG monitoring and SSI to maintain glucose 140-180 mg/dl  DM education   Other Active Problems: Active Problems:   IVH (intraventricular hemorrhage) (HCC)   Dysphagia, post-stroke   Type 2 diabetes mellitus with peripheral neuropathy (HCC)   History of tobacco abuse   Benign essential HTN   ESRD on dialysis ([place])   History of traumatic brain injury   History of CVA with residual deficit   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 Nothing; Fluid restriction: 1200 mL Fluid; Room service appropriate? Yes; Fluid consistency: Thin   FAMILY UPDATES: No family at bedside  TEAM UPDATES:Xu, Jindong, MD   Prior Home Stroke Medications:  No antithrombotic  Discharge Stroke Meds:  Please discharge patient on TBD , will likely add ASA prior to discharge  Disposition: 01-Home or Self Care   Therapy Recs:   12/18/2017, Discussion with MSW,  Discharge to [facility] -Pending set up with outpatient Hemodialysis. Patient has been able to sit up in chair x 4 hours per day per nursing. No new updates on possibility to discharge  Home Equipment:         TBD Follow Up:  Follow-up Information    Garvin Fila, MD. Schedule an appointment as soon as possible for a  visit in 6 week(s).   Specialties:  Neurology, Radiology Contact information: [address] [place] [address] Philo, Valentine, MD -PCP Follow up in 1-2 weeks            Patient getting hemodialysis today. still pending placement.  No acute event overnight. Still has left  hemiplegia. working with PT/OT. Pt needs to be able to sit more than 4 hours a day before SNF acceptance.   Antony Contras, MD Stroke Neurology 12/20/2017 12:46 PM  Please page stroke NP  Or  PA  Or MD from 8am -4 pm  as this patient from this time will be  followed by the stroke.   You can look them up on [email or web address]  Password [email or web address]

## 2017-12-21 LAB — GLUCOSE, CAPILLARY
GLUCOSE-CAPILLARY: 145 mg/dL — AB (ref 65–99)
GLUCOSE-CAPILLARY: 127 mg/dL — AB (ref 65–99)
Glucose-Capillary: 110 mg/dL — ABNORMAL HIGH (ref 65–99)
Glucose-Capillary: 92 mg/dL (ref 65–99)
Glucose-Capillary: 130 mg/dL — ABNORMAL HIGH (ref 65–99)

## 2017-12-21 NOTE — Progress Notes (Signed)
NEUROHOSPITALISTS - STROKE TEAM DAILY PROGRESS NOTE   SUBJECTIVE His friend is at the bedside. Patient  Is , sitting up in bed. Talking and interactive with examiner. No new/acute events reported overnight.   He is able to move left leg a little today.Pending placement to SNF.  Physical Examination: Vitals:   12/20/17 2152 12/21/17 0141 12/21/17 0550 12/21/17 1044  BP: (!) 157/76 (!) 141/68 (!) 149/64 (!) 162/69  Pulse: 69 72 65 78  Resp: _0 Temp: 98.6 F (37 C) 98.8 F (37.1 C) 98.8 F (37.1 C)   TempSrc: Oral Oral Oral Oral  SpO2: 98% 99% 97% 98%  Weight:      Height:       HEENT-  Normocephalic,  Cardiovascular - Regular rate and rhythm, + murmur Respiratory - Lungs clear bilaterally. Non-labored breathing, No wheezing. Abdomen - soft and non-tender, BS normal Extremities- no edema or cyanosis Skin-warm and dry  Neurological Examination Mental Status: Awake,oriented x 3  Speech is slightly dysarthric. Able to follow 3 step commands without difficulty. Cranial Nerves: II: Left field cut. Pupils are equal, round, and reactive to light.   III,IV, VI: EOMI without ptosis or diploplia. No nystagmus.   V: Facial sensation is decreased on the Left VII: Facial movement is decreased on the Left VIII: hearing is intact to voice X: Uvula elevates symmetrically XI: Shoulder shrug is decreased on the left XII: tongue is midline without atrophy or fasciculations.  Motor: Tone is normal. Bulk is normal. 5/5 strength on the Right.  Dense left hemiplegia and Strength is 1/5 on the Left leg and 0/5 in left  arm   Sensor: Sensation is decreased on the Left in the arms and legs. Normal on the Right Deep Tendon Reflexes: 1+ and symmetric throughout in the biceps and patellae Plantars: Toes are downgoing bilaterally.  Cerebellar: unable to perform finger-to-nose on Left Gait: Truncal ataxia to Left   Lab Results: CBC: Recent Labs  Lab 12/17/17 0851  12/18/17 0854 12/20/17 1109  WBC 4.6 4.6 6.3  HGB 9.5* 9.9* 10.0*  HCT 29.5* 30.1* 30.9*  MCV 89.4 89.1 91.2  PLT 249 244 789   Basic Metabolic Panel: Recent Labs  Lab 12/15/17 0757  12/16/17 0744 12/17/17 0851 12/18/17 0854 12/20/17 1109  NA 132*  --  129* 130* 129* 129*  K 5.7*  --  6.3* 4.5 4.6 4.5  CL 96*  --  95* 92* 93* 94*  CO2 24  --  _1 GLUCOSE 92  --  100* 189* 164* 111*  BUN 36*  --  50* 28* 41* 34*  CREATININE 6.46*  --  7.88* 5.23* 6.73* 6.14*  CALCIUM 8.6*   < > 8.3* 8.5* 8.3* 8.9  MG  --   --   --  1.7  --   --   PHOS  --   --  5.0* 5.1* 5.2* 4.5   < > = values in this interval not displayed.   Liver Function Tests: Recent Labs  Lab 12/16/17 0744 12/17/17 0851 12/18/17 0854 12/20/17 1109  AST  --  20  --   --   ALT  --  13*  --   --   ALKPHOS  --  253*  --   --   BILITOT  --  0.8  --   --   PROT  --  6.3*  --   --   ALBUMIN 2.8* 3.1* 3.1* 3.2*   Imaging:  Ct Head Code Stroke Wo Contrast Result Date: 12/07/2017 IMPRESSION:  1. Acute right thalamic hemorrhage. Mild edema and local mass effect.  2. 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  3. New age indeterminate left thalamic lacunar infarct.   Ct Angio Head W Or Wo Contrast Result Date: 12/07/2017 IMPRESSION: 1. No aneurysm, vascular malformation, or spot sign associated with the right thalamic hemorrhage. 2. Patent circle of Willis with intracranial atherosclerosis as above.  Moderate to severe right PCA stenoses.   Ct Head Wo Contrast Result Date: 12/08/2017 IMPRESSION:  1. Stable right thalamic hematoma and small volume of hemorrhage in right lateral ventricle.  2. Mild increase of mass effect with 5 mm right-to-left midline shift, previously 4 mm.  3. No new acute intracranial abnormality identified.   ECHO  12/12/2017 Study Conclusions - Left ventricle: The cavity size was normal. There was moderate   concentric hypertrophy.  Systolic function was normal. The   estimated ejection fraction was in the range of 55% to 60%. Wall   motion was normal; there were no regional wall motion   abnormalities. There was a reduced contribution of atrial   contraction to ventricular filling, due to increased ventricular   diastolic pressure or atrial contractile dysfunction. Doppler   parameters are consistent with a reversible restrictive pattern,   indicative of decreased left ventricular diastolic compliance   and/or increased left atrial pressure (grade 3 diastolic   dysfunction). Doppler parameters are consistent with high   ventricular filling pressure. - Aortic valve: Trileaflet; mildly thickened, mildly calcified   leaflets. Moderate focal calcification involving the left   coronary cusp. - Aorta: Ascending aorta diameter: 40 mm (ED). - Ascending aorta: The ascending aorta was mildly dilated. - Mitral valve: Calcified annulus. There was mild regurgitation. - Left atrium: The atrium was mildly dilated. - Right ventricle: The cavity size was mildly dilated. Wall   thickness was normal. - Tricuspid valve: There was trivial regurgitation  Carotid Duplex           12/11/2017 Final Interpretation: Right Carotid:  The extracranial vessels were near-normal with only minimal wall thickening or plaque. Left Carotid: The extracranial vessels were near-normal with only minimal wall thickening or plaque. Vertebrals: Left vertebral artery was patent with antegrade flow.  Right vertebral artery was not visualized. Subclavians: Normal flow hemodynamics were seen in the right subclavian artery.  Left subclavian artery exhibits monophasic flow, suggestive of possbile proximal obstruction     IMPRESSION: [name] is a 52 y.o. male with PMH of ESRD on Dialysis, HTN, Hx of CVA without deficits, BPH and Depression that presents to the ED as a code stroke with reports of acute onset Left sided weakness, Left facial droop,  decreased sensation on the Left, slightly dysarthric speech. . EMS reports that patient fell out of his bed at approximately 15:40. Patients room mate states that he last saw him normal at 07:00 this morning. CT Head reveals:  Acute right thalamic hemorrhage with mild cytotoxic edema New age indeterminate left thalamic lacunar infarct  Suspected Etiology: HTN hemorrhage and small vessel disease Resultant Symptoms: Left sided weakness, Left facial droop, decreased sensation on Left Stroke Risk Factors: hypertension, ESRD on Dialysis Other Stroke Risk Factors: Advanced age, ?ETOH use, Hx stroke  Outstanding Stroke Work-up Studies:   Work up completed at this time  PLAN  12/21/2017: Awaiting bed in SNF for discharge Frequent Neurochecks  Telemetry Monitoring Continue to HOLD Aspirin as patient  has a right ICH For worsening mentation, obtain STAT Head CT- and contact Neurology immediately F/U PT/OT/SLP at SNF Ongoing aggressive stroke risk factor management Patient will be counseled to be compliant with his antithrombotic medications Patient will be counseled on Lifestyle modifications including, Diet, Exercise, and Stress Follow up with [facility] in 6 weeks  HX OF STROKES:  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  DYSPHAGIA:  Passed SLP swallow evaluation- Dysphagia 3 diet  Aspiration Precautions in progress  MEDICAL ISSUES:  ESRD on Dialysis - HD in AM (Thursday)  Stable, Nephrology following, Appreciate Assistance  HD today  Creatinine - 6.73  Anemia Chronic  Stable, trending up slowly   Iron Studies in progress  Nephrology to start ESA, IV Iron, Aranesp qHD  Repeat labs in AM  Hyperkalemia Stable - HD today - K+4.5 Repeat labs in AM  Hyponatremia  Stable, Nephrology following  Repeat labs in AM  - Na 129  Hypocalcemia - Secondary Hyperparathyroidism  Stable, Nephrology following   Repeat labs in AM   -  8.3  Elevated Alk  Phos  Stable, Nephrology following  12/17/2017 - 253  Elevated Phosphorus Stable, Nephrology following 12/18/2017  -  5.2 Leg Spasms  Zanaflex BID PRN  Diarrhea  Resolved  No further reports per RN   Formed BM 12/12/2017  Imodium PRN  HYPERTENSION:  Stable, slowly trending down, Will continue to monitor closely  SBP goal less than 140/90   Labetolol PRN  Long term BP goal normotensive.  Patient currently on: Norvasc, Clonidine, Hydralazine  HYPERLIPIDEMIA  Home Meds:  NONE  LDL  goal < 70, LDL 73  Will start Lipitor 10 mg  Continue statin at discharge  DIABETES:  HgbA1c goal < 7.0, HgbA1C = 4.0  Currently on:  Novolog  Continue CBG monitoring and SSI to maintain glucose 140-180 mg/dl  DM education   Other Active Problems: Active Problems:   IVH (intraventricular hemorrhage) (HCC)   Dysphagia, post-stroke   Type 2 diabetes mellitus with peripheral neuropathy (HCC)   History of tobacco abuse   Benign essential HTN   ESRD on dialysis ([place])   History of traumatic brain injury   History of CVA with residual deficit   History of transmetatarsal amputation of foot ([place])   ICH (intracerebral hemorrhage) ([place])    Hospital day # 14 VTE prophylaxis: SCD's  Diet - Diet renal/carb modified with fluid restriction Diet-HS Snack? Nothing; Fluid restriction: 1200 mL Fluid; Room service appropriate? Yes; Fluid consistency: Thin   FAMILY UPDATES: No family at bedside  TEAM UPDATES:Jacksen Isip, Lucy [place], MD   Prior Home Stroke Medications:  No antithrombotic  Discharge Stroke Meds:  Please discharge patient on TBD , will likely add ASA prior to discharge  Disposition: 01-Home or Self Care   Therapy Recs:   12/18/2017, Discussion with MSW,  Discharge to [facility] -Pending set up with outpatient Hemodialysis. Patient has been able to sit up in chair x 4 hours per day per nursing. No new updates on possibility to discharge  Home Equipment:         TBD Follow Up:   Follow-up Information    Garvin Fila, MD. Schedule an appointment as soon as possible for a visit in 6 week(s).   Specialties:  Neurology, Radiology Contact information: [address] [place] [address] [place], Dibble, MD -PCP Follow up in 1-2 weeks  Patient showing slight left sided strength improvement. still pending nursing home placement.  No acute event overnight. Still has left hemiplegia. working with PT/OT. Pt needs to be able to sit more than 4 hours a day before SNF acceptance.   Antony Contras, MD Stroke Neurology 12/21/2017 11:30 AM  Please page stroke NP  Or  PA  Or MD from 8am -4 pm  as this patient from this time will be  followed by the stroke.   You can look them up on [email or web address]  Password [email or web address]

## 2017-12-21 NOTE — Progress Notes (Signed)
Nurse gave 4mg  of zanzflex.. Immediatly discovered the order is for 2mg  zanaflex.   MD Notify he stated to continue to monitor.

## 2017-12-21 NOTE — Progress Notes (Signed)
Nurse has been monitoring Pt. Since 4mg  dose of zanaflex.  Pt. Rested a while and now is awake, alert and orient  X 4.  Denies pain.

## 2017-12-21 NOTE — Progress Notes (Signed)
Subjective: Interval History: has no complaints, thinks some strength.  Objective: Vital signs in last 24 hours: Temp:  [96.7 F (35.9 C)-99.5 F (37.5 C)] 98.8 F (37.1 C) (01/20 0550) Pulse Rate:  [61-79] 65 (01/20 0550) Resp:  [16-19] 16 (01/20 0550) BP: (86-158)/(56-81) 149/64 (01/20 0550) SpO2:  [97 %-99 %] 97 % (01/20 0550) Weight:  [28.8 kg (63 lb 6.4 oz)] 28.8 kg (63 lb 6.4 oz) (01/19 1617) Weight change:   Intake/Output from previous day: 01/19 0701 - 01/20 0700 In: -  Out: 4129 [Urine:400] Intake/Output this shift: No intake/output data recorded.  General appearance: alert, cooperative, pale and L hemiparesis Resp: clear to auscultation bilaterally Cardio: S1, S2 normal and systolic murmur: systolic ejection 2/6, decrescendo at 2nd left intercostal space GI: soft, non-tender; bowel sounds normal; no masses,  no organomegaly Extremities: AVF L arm  Lab Results: Recent Labs    12/20/17 1109  WBC 6.3  HGB 10.0*  HCT 30.9*  PLT 298   BMET:  Recent Labs    12/20/17 1109  NA 129*  K 4.5  CL 94*  CO2 22  GLUCOSE 111*  BUN 34*  CREATININE 6.14*  CALCIUM 8.9   No results for input(s): PTH in the last 72 hours. Iron Studies: No results for input(s): IRON, TIBC, TRANSFERRIN, FERRITIN in the last 72 hours.  Studies/Results: No results found.  I have reviewed the patient's current medications.  Assessment/Plan: 1 ESRD HD TTS. 2 HTN improved  Less meds with vol control 3 Anemia esa 4 HPTH vit D 5 CVA 6 DM controlled 7 NONADHERENCE P HD, esa, PT, NHP    LOS: 14 days   Wesley Burns 12/21/2017,9:24 AM

## 2017-12-22 LAB — GLUCOSE, CAPILLARY
GLUCOSE-CAPILLARY: 85 mg/dL (ref 65–99)
Glucose-Capillary: 92 mg/dL (ref 65–99)

## 2017-12-22 NOTE — Progress Notes (Signed)
Physical Therapy Treatment Patient Details Name: Wesley Burns MRN: [id number] DOB: 1966/07/14 Today's Date: 12/22/2017    History of Present Illness 52 y.o. male with ESRD, Type 2 DM, HTN, GERD, and Hx of Right basal ganglia CVA (2017), right transmet amputation. Admitted with right thalamic hemorrhagic stroke.    PT Comments    Pt making good progress towards his goals today despite being very frustrated with his current level of mobility. Pt currently modA for bed mobility, and modAx2 for full stand pivot transfer from bed to recliner. Pt continues to have decreased awareness of instability and need for core activation to maintain balance with activity outside of BoS. D/c plans continue to remain appropriate. PT will follow acutely until d/c.    Follow Up Recommendations  Supervision/Assistance - 24 hour;SNF     Equipment Recommendations  Other (comment)(TBD)       Precautions / Restrictions Precautions Precautions: Fall Restrictions Weight Bearing Restrictions: No    Mobility  Bed Mobility Overal bed mobility: Needs Assistance Bed Mobility: Supine to Sit     Supine to sit: Mod assist     General bed mobility comments: modA for L LE management off of the bed, trunk to upright and pad scoot to EoB  Transfers Overall transfer level: Needs assistance Equipment used: 2 person hand held assist Transfers: Stand Pivot Transfers Sit to Stand: Mod assist;+2 physical assistance Stand pivot transfers: Mod assist;+2 physical assistance       General transfer comment: mod Ax2 for blocking R knee and power up into standing, vc for weight shifting to move L LE back towards bed, modAx 2 for powerup and steadying to readjust hips in recliner  Ambulation/Gait             General Gait Details: unsafe to attempt     Balance Overall balance assessment: Needs assistance Sitting-balance support: Single extremity supported;Feet supported Sitting balance-Leahy Scale:  Fair Sitting balance - Comments: Pt able to sit in EoB without support for short periods of time. Pt able to tolerate only very slight changes, pt attempt to use R arm to scratch back and lost balance to left                                     Cognition Arousal/Alertness: Awake/alert Behavior During Therapy: WFL for tasks assessed/performed Overall Cognitive Status: Impaired/Different from baseline Area of Impairment: Attention;Following commands;Safety/judgement;Awareness;Problem solving                   Current Attention Level: Selective   Following Commands: Follows multi-step commands inconsistently Safety/Judgement: Decreased awareness of safety Awareness: Emergent Problem Solving: Slow processing;Decreased initiation;Difficulty sequencing;Requires verbal cues;Requires tactile cues General Comments: pt with good command following when focused on tasked at hand, easily distracted             Pertinent Vitals/Pain Pain Assessment: No/denies pain           PT Goals (current goals can now be found in the care plan section) Acute Rehab PT Goals Patient Stated Goal: return home, read PT Goal Formulation: With patient Time For Goal Achievement: 12/22/17 Potential to Achieve Goals: Good Progress towards PT goals: Progressing toward goals    Frequency    Min 3X/week      PT Plan Current plan remains appropriate       AM-PAC PT "6 Clicks" Daily Activity  Outcome Measure  Difficulty turning over  in bed (including adjusting bedclothes, sheets and blankets)?: Unable Difficulty moving from lying on back to sitting on the side of the bed? : Unable Difficulty sitting down on and standing up from a chair with arms (e.g., wheelchair, bedside commode, etc,.)?: Unable Help needed moving to and from a bed to chair (including a wheelchair)?: Total Help needed walking in hospital room?: Total Help needed climbing 3-5 steps with a railing? : Total 6  Click Score: 6    End of Session Equipment Utilized During Treatment: Gait belt Activity Tolerance: Patient tolerated treatment well Patient left: in bed;with call bell/phone within reach;with bed alarm set;with nursing/sitter in room;with family/visitor present Nurse Communication: Mobility status PT Visit Diagnosis: Unsteadiness on feet (R26.81);Other abnormalities of gait and mobility (R26.89);Hemiplegia and hemiparesis Hemiplegia - Right/Left: Left Hemiplegia - dominant/non-dominant: Non-dominant Hemiplegia - caused by: Other Nontraumatic intracranial hemorrhage     Time: 9163-8466 PT Time Calculation (min) (ACUTE ONLY): 17 min  Charges:  $Therapeutic Activity: 8-22 mins                    G Codes:       [name] B. Beverely Risen PT, DPT Acute Rehabilitation  [phone] Pager [phone]     [facility] 12/22/2017, 3:34 PM

## 2017-12-22 NOTE — Progress Notes (Signed)
CSW called [facility] and confirmed they received placement request for patient. [facility] to follow up with CSW when they have reviewed and determined if they will have HD chair available for patient. CSW to follow.  Abigail Butts, LCSWA [phone]

## 2017-12-22 NOTE — Progress Notes (Signed)
NEUROHOSPITALISTS - STROKE TEAM DAILY PROGRESS NOTE   SUBJECTIVE The patient's friend is at the bedside. He brought in the patient's golden retriever dog for a visit. This seemed to boost the patient's spirits considerably. We are still awaiting nursing home placement. The patient's friend has met with the Education officer, museum.  Physical Examination: Vitals:   12/21/17 2109 12/22/17 0134 12/22/17 0507 12/22/17 1430  BP: (!) 166/71 (!) 146/70 (!) 151/73 138/68  Pulse: 73 71 71 68  Resp: _0 Temp: 97.9 F (36.6 C) 98.9 F (37.2 C) 98.6 F (37 C) 98.1 F (36.7 C)  TempSrc: Oral Oral Oral Oral  SpO2: 98% 97% 98% 98%  Weight:      Height:       HEENT-  Normocephalic,  Cardiovascular - Regular rate and rhythm, + murmur Respiratory - Lungs clear bilaterally. Non-labored breathing, No wheezing. Abdomen - soft and non-tender, BS normal Extremities- no edema or cyanosis Skin-warm and dry  Neurological Examination Mental Status: Awake,oriented x 3  Speech is slightly dysarthric. Able to follow 3 step commands without difficulty. Cranial Nerves: II: Left field cut. Pupils are equal, round, and reactive to light.   III,IV, VI: EOMI without ptosis or diploplia. No nystagmus.   V: Facial sensation is decreased on the Left VII: Facial movement is decreased on the Left VIII: hearing is intact to voice X: Uvula elevates symmetrically XI: Shoulder shrug is decreased on the left XII: tongue is midline without atrophy or fasciculations.  Motor: Tone is normal. Bulk is normal. 5/5 strength on the Right.  Dense left hemiplegia and Strength is 1/5 on the Left leg and 0/5 in left  arm   Sensor: Sensation is decreased on the Left in the arms and legs. Normal on the Right Deep Tendon Reflexes: 1+ and symmetric throughout in the biceps and patellae Plantars: Toes are downgoing bilaterally.  Cerebellar: unable to perform finger-to-nose on Left Gait: Truncal ataxia to Left    Lab Results: CBC: Recent Labs  Lab 12/17/17 0851 12/18/17 0854 12/20/17 1109  WBC 4.6 4.6 6.3  HGB 9.5* 9.9* 10.0*  HCT 29.5* 30.1* 30.9*  MCV 89.4 89.1 91.2  PLT 249 244 035   Basic Metabolic Panel: Recent Labs  Lab 12/16/17 0744 12/17/17 0851 12/18/17 0854 12/20/17 1109  NA 129* 130* 129* 129*  K 6.3* 4.5 4.6 4.5  CL 95* 92* 93* 94*  CO2 _1 GLUCOSE 100* 189* 164* 111*  BUN 50* 28* 41* 34*  CREATININE 7.88* 5.23* 6.73* 6.14*  CALCIUM 8.3* 8.5* 8.3* 8.9  MG  --  1.7  --   --   PHOS 5.0* 5.1* 5.2* 4.5   Liver Function Tests: Recent Labs  Lab 12/16/17 0744 12/17/17 0851 12/18/17 0854 12/20/17 1109  AST  --  20  --   --   ALT  --  13*  --   --   ALKPHOS  --  253*  --   --   BILITOT  --  0.8  --   --   PROT  --  6.3*  --   --   ALBUMIN 2.8* 3.1* 3.1* 3.2*   Imaging:  Ct Head Code Stroke Wo Contrast Result Date: 12/07/2017 IMPRESSION:  1. Acute right thalamic hemorrhage. Mild edema and local mass effect.  2. 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  3. New age indeterminate left thalamic lacunar infarct.   Ct Angio Head W Or Wo  Contrast Result Date: 12/07/2017 IMPRESSION: 1. No aneurysm, vascular malformation, or spot sign associated with the right thalamic hemorrhage. 2. Patent circle of Willis with intracranial atherosclerosis as above.  Moderate to severe right PCA stenoses.   Ct Head Wo Contrast Result Date: 12/08/2017 IMPRESSION:  1. Stable right thalamic hematoma and small volume of hemorrhage in right lateral ventricle.  2. Mild increase of mass effect with 5 mm right-to-left midline shift, previously 4 mm.  3. No new acute intracranial abnormality identified.   ECHO  12/12/2017 Study Conclusions - Left ventricle: The cavity size was normal. There was moderate   concentric hypertrophy. Systolic function was normal. The   estimated ejection fraction was in the range of 55% to 60%. Wall    motion was normal; there were no regional wall motion   abnormalities. There was a reduced contribution of atrial   contraction to ventricular filling, due to increased ventricular   diastolic pressure or atrial contractile dysfunction. Doppler   parameters are consistent with a reversible restrictive pattern,   indicative of decreased left ventricular diastolic compliance   and/or increased left atrial pressure (grade 3 diastolic   dysfunction). Doppler parameters are consistent with high   ventricular filling pressure. - Aortic valve: Trileaflet; mildly thickened, mildly calcified   leaflets. Moderate focal calcification involving the left   coronary cusp. - Aorta: Ascending aorta diameter: 40 mm (ED). - Ascending aorta: The ascending aorta was mildly dilated. - Mitral valve: Calcified annulus. There was mild regurgitation. - Left atrium: The atrium was mildly dilated. - Right ventricle: The cavity size was mildly dilated. Wall   thickness was normal. - Tricuspid valve: There was trivial regurgitation  Carotid Duplex           12/11/2017 Final Interpretation: Right Carotid:  The extracranial vessels were near-normal with only minimal wall thickening or plaque. Left Carotid: The extracranial vessels were near-normal with only minimal wall thickening or plaque. Vertebrals: Left vertebral artery was patent with antegrade flow.  Right vertebral artery was not visualized. Subclavians: Normal flow hemodynamics were seen in the right subclavian artery.  Left subclavian artery exhibits monophasic flow, suggestive of possbile proximal obstruction     IMPRESSION: [name] is a 52 y.o. male with PMH of ESRD on Dialysis, HTN, Hx of CVA without deficits, BPH and Depression that presents to the ED as a code stroke with reports of acute onset Left sided weakness, Left facial droop, decreased sensation on the Left, slightly dysarthric speech. . EMS reports that patient fell out of his  bed at approximately 15:40. Patients room mate states that he last saw him normal at 07:00 this morning. CT Head reveals:  Acute right thalamic hemorrhage with mild cytotoxic edema New age indeterminate left thalamic lacunar infarct  Suspected Etiology: HTN hemorrhage and small vessel disease Resultant Symptoms: Left sided weakness, Left facial droop, decreased sensation on Left Stroke Risk Factors: hypertension, ESRD on Dialysis Other Stroke Risk Factors: Advanced age, ?ETOH use, Hx stroke  Outstanding Stroke Work-up Studies:   Work up completed at this time  PLAN  12/22/2017: Awaiting bed in SNF for discharge Frequent Neurochecks  Telemetry Monitoring Continue to HOLD Aspirin as patient has a right ICH For worsening mentation, obtain STAT Head CT- and contact Neurology immediately F/U PT/OT/SLP at SNF Ongoing aggressive stroke risk factor management Patient will be counseled to be compliant with his antithrombotic medications Patient will be counseled on Lifestyle modifications including, Diet, Exercise, and Stress Follow up with [facility]roke [facility] in 6 weeks  HX OF STROKES:  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  DYSPHAGIA:  Passed SLP swallow evaluation- Dysphagia 3 diet  Aspiration Precautions in progress  MEDICAL ISSUES:  ESRD on Dialysis - HD in AM (Thursday)  Stable, Nephrology following, Appreciate Assistance  HD today  Creatinine - 6.73  Anemia Chronic  Stable, trending up slowly   Iron Studies in progress  Nephrology to start ESA, IV Iron, Aranesp qHD  Repeat labs in AM  Hyperkalemia Stable - HD today - K+4.5 Repeat labs in AM  Hyponatremia  Stable, Nephrology following  Na 129  Hypocalcemia - Secondary Hyperparathyroidism  Stable, Nephrology following   8.9  Elevated Alk Phos  Stable, Nephrology following  12/17/2017 - 253  Elevated Phosphorus - resolved Stable, Nephrology following 4.5  Leg Spasms  Zanaflex BID  PRN  Diarrhea  Resolved  No further reports per RN   Formed BM 12/12/2017  Imodium PRN  HYPERTENSION:  Stable, slowly trending down, Will continue to monitor closely  SBP goal less than 140/90 most of the time  Labetolol PRN  Long term BP goal normotensive.  Patient currently on: Norvasc, Clonidine, Hydralazine  HYPERLIPIDEMIA  Home Meds:  NONE  LDL  goal < 70, LDL 73  Will start Lipitor 10 mg  Continue statin at discharge  DIABETES:  HgbA1c goal < 7.0, HgbA1C = 4.0  Currently on:  Novolog  Continue CBG monitoring and SSI to maintain glucose 140-180 mg/dl  DM education   Other Active Problems: Active Problems:   IVH (intraventricular hemorrhage) (HCC)   Dysphagia, post-stroke   Type 2 diabetes mellitus with peripheral neuropathy (HCC)   History of tobacco abuse   Benign essential HTN   ESRD on dialysis ([place])   History of traumatic brain injury   History of CVA with residual deficit   History of transmetatarsal amputation of foot ([place])   ICH (intracerebral hemorrhage) ([place])    Hospital day # 15 VTE prophylaxis: SCD's  Diet - Fall precautions Diet renal/carb modified with fluid restriction Diet-HS Snack? Nothing; Fluid restriction: 1200 mL Fluid; Room service appropriate? Yes; Fluid consistency: Thin   FAMILY UPDATES: No family at bedside  TEAM UPDATES:Tyeler Goedken, Lucy [place], MD   Prior Home Stroke Medications:  No antithrombotic  Discharge Stroke Meds:  Please discharge patient on TBD , will likely add ASA prior to discharge  Disposition: 01-Home or Self Care   Therapy Recs:   12/18/2017, Discussion with MSW,  Discharge to [facility] -Pending set up with outpatient Hemodialysis. Patient has been able to sit up in chair x 4 hours per day per nursing. No new updates on possibility to discharge  Home Equipment:         TBD Follow Up:  Follow-up Information    Garvin Fila, MD. Schedule an appointment as soon as possible for a visit in 6 week(s).    Specialties:  Neurology, Radiology Contact information: [address] [place] [address] [place], [place], MD -PCP Follow up in 1-2 weeks           Mikey Bussing PA-C [facility] Pager [phone] 12/22/2017, 3:42 PM  I have personally examined this patient, reviewed notes, independently viewed imaging studies, participated in medical decision making and plan of care.ROS completed by me personally and pertinent positives fully documented  I have made any additions or clarifications directly to the above note. Agree with note above.  Antony Contras, MD Medical Director [facility] Pager: [phone] 12/22/2017 4:25 PM   Please page stroke NP  Or  PA  Or MD from 8am -4 pm  as this patient from this time will be  followed by the stroke.   You can look them up on [email or web address]  Password [email or web address]

## 2017-12-22 NOTE — Progress Notes (Signed)
Subjective: Interval History: has no complaints  Objective: Vital signs in last 24 hours: Temp:  [97.9 F (36.6 C)-98.9 F (37.2 C)] 98.6 F (37 C) (01/21 0507) Pulse Rate:  [68-77] 71 (01/21 0507) Resp:  [18] 18 (01/21 0507) BP: (146-166)/(70-77) 151/73 (01/21 0507) SpO2:  [97 %-98 %] 98 % (01/21 0507) Weight change:   Intake/Output from previous day: No intake/output data recorded. Intake/Output this shift: No intake/output data recorded.  General appearance: alert, cooperative, pale and L hemiparesis Resp: clear to auscultation bilaterally Cardio: S1, S2 normal and systolic murmur: systolic ejection 2/6, decrescendo at 2nd left intercostal space GI: soft, non-tender; bowel sounds normal; no masses,  no organomegaly Extremities: AVF L forearm  Lab Results: Recent Labs    12/20/17 1109  WBC 6.3  HGB 10.0*  HCT 30.9*  PLT 298   BMET:  Recent Labs    12/20/17 1109  NA 129*  K 4.5  CL 94*  CO2 22  GLUCOSE 111*  BUN 34*  CREATININE 6.14*  CALCIUM 8.9   No results for input(s): PTH in the last 72 hours. Iron Studies: No results for input(s): IRON, TIBC, TRANSFERRIN, FERRITIN in the last 72 hours.  Studies/Results: No results found.  TTS at [facility] 4h   EDW 65kg, 2K/2Ca bath  AVF   Heparin 2000 - Mircera IV q 2 weeks (last 1/3) - Calcitriol 0.78mcg PO q HD   I have reviewed the patient's current medications.  Assessment: 1 ESRD HD TTS. 2 HTN improved  Less meds with vol control 3 Anemia esa 4 HPTH vit D 5 CVA 6 DM controlled 7 NONADHERENCE 8 Dispo - per SW, SNFP  P HD, esa, PT, NHP    LOS: 15 days   Maree Krabbe 12/22/2017,11:12 AM

## 2017-12-23 LAB — GLUCOSE, CAPILLARY
GLUCOSE-CAPILLARY: 123 mg/dL — AB (ref 65–99)
GLUCOSE-CAPILLARY: 118 mg/dL — AB (ref 65–99)
GLUCOSE-CAPILLARY: 135 mg/dL — AB (ref 65–99)
Glucose-Capillary: 118 mg/dL — ABNORMAL HIGH (ref 65–99)
Glucose-Capillary: 90 mg/dL (ref 65–99)
Glucose-Capillary: 96 mg/dL (ref 65–99)
Glucose-Capillary: 180 mg/dL — ABNORMAL HIGH (ref 65–99)

## 2017-12-23 MED ORDER — ONDANSETRON HCL 4 MG/2ML IJ SOLN
4.0000 mg | Freq: Once | INTRAMUSCULAR | Status: AC
  Administered 2017-12-23: 4 mg via INTRAVENOUS
  Filled 2017-12-23: qty 2

## 2017-12-23 MED ORDER — ONDANSETRON HCL 4 MG/2ML IJ SOLN
4.0000 mg | Freq: Four times a day (QID) | INTRAMUSCULAR | Status: DC | PRN
  Administered 2018-01-01 (×2): 4 mg via INTRAVENOUS
  Filled 2017-12-23 (×2): qty 2

## 2017-12-23 NOTE — Progress Notes (Signed)
[facility] Progress Note   Subjective:   C/os of nausea/vomiting today. Not tolerating meals today.  Objective Vitals:   12/22/17 2115 12/23/17 0100 12/23/17 0600 12/23/17 0919  BP: (!) 184/86 (!) 141/74 137/72 (!) 175/81  Pulse: 79 71 77 90  Resp: 18 16 18 16   Temp: 98.6 F (37 C) 98.4 F (36.9 C) 98.6 F (37 C)   TempSrc: Oral Oral Oral   SpO2: 98% 98% 98% 99%  Weight:      Height:       Physical Exam General:NAD, sitting in bedside chair, left sided weakness/facial droop Heart:RRR Lungs:CTAB Abdomen:soft, NT Extremities:no edema Dialysis Access: LU AVF +b/t   Filed Weights   12/18/17 1215 12/18/17 1622 12/20/17 1617  Weight: 68.2 kg (150 lb 5.7 oz) 64.3 kg (141 lb 12.1 oz) 28.8 kg (63 lb 6.4 oz)   No intake or output data in the 24 hours ending 12/23/17 1209  Additional Objective Labs: Basic Metabolic Panel: Recent Labs  Lab 12/17/17 0851 12/18/17 0854 12/20/17 1109  NA 130* 129* 129*  K 4.5 4.6 4.5  CL 92* 93* 94*  CO2 25 22 22   GLUCOSE 189* 164* 111*  BUN 28* 41* 34*  CREATININE 5.23* 6.73* 6.14*  CALCIUM 8.5* 8.3* 8.9  PHOS 5.1* 5.2* 4.5   Liver Function Tests: Recent Labs  Lab 12/17/17 0851 12/18/17 0854 12/20/17 1109  AST 20  --   --   ALT 13*  --   --   ALKPHOS 253*  --   --   BILITOT 0.8  --   --   PROT 6.3*  --   --   ALBUMIN 3.1* 3.1* 3.2*   CBC: Recent Labs  Lab 12/17/17 0851 12/18/17 0854 12/20/17 1109  WBC 4.6 4.6 6.3  HGB 9.5* 9.9* 10.0*  HCT 29.5* 30.1* 30.9*  MCV 89.4 89.1 91.2  PLT 249 244 298   CBG: Recent Labs  Lab 12/22/17 1054 12/22/17 1637 12/22/17 2114 12/23/17 0621 12/23/17 1147  GLUCAP 118* 123* 118* 90 135*   Lab Results  Component Value Date   INR 0.93 12/07/2017   INR 0.94 11/02/2016   INR 1.00 04/28/2015   Studies/Results: No results found.  Medications: . ferric gluconate (FERRLECIT/NULECIT) IV 125 mg (12/20/17 1258)   .  stroke: mapping our early stages of recovery  book   Does not apply Once  . amLODipine  10 mg Oral QHS  . atorvastatin  10 mg Oral q1800  . calcitRIOL  0.75 mcg Oral Once per day on Tue Thu Sat  . darbepoetin (ARANESP) injection - DIALYSIS  200 mcg Intravenous Q Tue-HD  . hydrALAZINE  50 mg Oral Q8H  . insulin aspart  0-9 Units Subcutaneous TID WC  . multivitamin  1 tablet Oral QHS  . pantoprazole  40 mg Oral QHS  . polyethylene glycol  17 g Oral Daily  . senna-docusate  1 tablet Oral BID  . sevelamer carbonate  800 mg Oral TID WC    Dialysis Orders: TTS at [facility] 4hr 65kg 2/2bath AVF Hep 2000 - Mircera [facility] IV q 2 weeks (last 1/3) - Calcitriol 0.40mcg PO q HD   Assessment/Plan: 1. R Thalamic Hemorrhagic Stroke (w/L sided deficits) - per Neuro 2. ESRD - Orders written per regular TTS schedule. Tight Heparin. K 4.5. 3. Anemia of CKD- last Hgb 10.0. IV iron started. On Aranesp qHD (Tues) Monitor. 4. Secondary hyperparathyroidism - Labs in goal. Continue VDRA and binders. 5. HTN/volume - BP  elevated, bed weights inaccurate. Need standing or hoyer weight to better assess. Does not appear volume overloaded, titrate down volume as tolerated.  6. Nutrition - Renal diet, rena-vite, Alb 3.2, Nepro TID.  7. Type 2 DM - per primary 8. Dispo - to SNF, per primary. 9 NONADHERENCE TO HD, MEDs  Virgina Norfolk, PA-C [facility] Pager: [phone] 12/23/2017,12:09 PM  LOS: 16 days   Pt seen, examined and agree w A/P as above.  Vinson Moselle MD [employer] pager [phone]   12/23/2017, 4:51 PM

## 2017-12-23 NOTE — Progress Notes (Signed)
NEUROHOSPITALISTS - STROKE TEAM DAILY PROGRESS NOTE   SUBJECTIVE No family at bedside this morning. Nurse states patient is complaining of nausea this AM. Orders for Zofran given. HD scheduled for today. Awaiting nursing home placement.   Physical Examination: Vitals:   12/22/17 2115 12/23/17 0100 12/23/17 0600 12/23/17 0919  BP: (!) 184/86 (!) 141/74 137/72 (!) 175/81  Pulse: 79 71 77 90  Resp: 18 16 18 16   Temp: 98.6 F (37 C) 98.4 F (36.9 C) 98.6 F (37 C)   TempSrc: Oral Oral Oral   SpO2: 98% 98% 98% 99%  Weight:      Height:       HEENT-  Normocephalic,  Cardiovascular - Regular rate and rhythm, + murmur Respiratory - Lungs clear bilaterally. Non-labored breathing, No wheezing. Abdomen - soft and non-tender, BS normal Extremities- no edema or cyanosis Skin-warm and dry  Neurological Examination Mental Status: Awake,oriented x 3  Speech is slightly dysarthric. Able to follow 3 step commands without difficulty. Cranial Nerves: II: Left field cut. Pupils are equal, round, and reactive to light.   III,IV, VI: EOMI without ptosis or diploplia. No nystagmus.   V: Facial sensation is decreased on the Left VII: Facial movement is decreased on the Left VIII: hearing is intact to voice X: Uvula elevates symmetrically XI: Shoulder shrug is decreased on the left XII: tongue is midline without atrophy or fasciculations.  Motor: Tone is normal. Bulk is normal. 5/5 strength on the Right.  Dense left hemiplegia and Strength is 1/5 on the Left leg and 0/5 in left  arm   Sensor: Sensation is decreased on the Left in the arms and legs. Normal on the Right Deep Tendon Reflexes: 1+ and symmetric throughout in the biceps and patellae Plantars: Toes are downgoing bilaterally.  Cerebellar: unable to perform finger-to-nose on Left Gait: Truncal ataxia to Left   Lab Results: CBC: Recent Labs  Lab 12/17/17 0851 12/18/17 0854 12/20/17 1109  WBC 4.6 4.6 6.3  HGB  9.5* 9.9* 10.0*  HCT 29.5* 30.1* 30.9*  MCV 89.4 89.1 91.2  PLT 249 244 740   Basic Metabolic Panel: Recent Labs  Lab 12/17/17 0851 12/18/17 0854 12/20/17 1109  NA 130* 129* 129*  K 4.5 4.6 4.5  CL 92* 93* 94*  CO2 25 22 22   GLUCOSE 189* 164* 111*  BUN 28* 41* 34*  CREATININE 5.23* 6.73* 6.14*  CALCIUM 8.5* 8.3* 8.9  MG 1.7  --   --   PHOS 5.1* 5.2* 4.5   Liver Function Tests: Recent Labs  Lab 12/17/17 0851 12/18/17 0854 12/20/17 1109  AST 20  --   --   ALT 13*  --   --   ALKPHOS 253*  --   --   BILITOT 0.8  --   --   PROT 6.3*  --   --   ALBUMIN 3.1* 3.1* 3.2*   Imaging:  Ct Head Code Stroke Wo Contrast Result Date: 12/07/2017 IMPRESSION:  1. Acute right thalamic hemorrhage. Mild edema and local mass effect.  2. 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  3. New age indeterminate left thalamic lacunar infarct.   Ct Angio Head W Or Wo Contrast Result Date: 12/07/2017 IMPRESSION: 1. No aneurysm, vascular malformation, or spot sign associated with the right thalamic hemorrhage. 2. Patent circle of Willis with intracranial atherosclerosis as above.  Moderate to severe right PCA stenoses.   Ct Head Wo Contrast Result Date: 12/08/2017 IMPRESSION:  1. Stable  right thalamic hematoma and small volume of hemorrhage in right lateral ventricle.  2. Mild increase of mass effect with 5 mm right-to-left midline shift, previously 4 mm.  3. No new acute intracranial abnormality identified.   ECHO  12/12/2017 Study Conclusions - Left ventricle: The cavity size was normal. There was moderate   concentric hypertrophy. Systolic function was normal. The   estimated ejection fraction was in the range of 55% to 60%. Wall   motion was normal; there were no regional wall motion   abnormalities. There was a reduced contribution of atrial   contraction to ventricular filling, due to increased ventricular   diastolic pressure or atrial  contractile dysfunction. Doppler   parameters are consistent with a reversible restrictive pattern,   indicative of decreased left ventricular diastolic compliance   and/or increased left atrial pressure (grade 3 diastolic   dysfunction). Doppler parameters are consistent with high   ventricular filling pressure. - Aortic valve: Trileaflet; mildly thickened, mildly calcified   leaflets. Moderate focal calcification involving the left   coronary cusp. - Aorta: Ascending aorta diameter: 40 mm (ED). - Ascending aorta: The ascending aorta was mildly dilated. - Mitral valve: Calcified annulus. There was mild regurgitation. - Left atrium: The atrium was mildly dilated. - Right ventricle: The cavity size was mildly dilated. Wall   thickness was normal. - Tricuspid valve: There was trivial regurgitation  Carotid Duplex           12/11/2017 Final Interpretation: Right Carotid:  The extracranial vessels were near-normal with only minimal wall thickening or plaque. Left Carotid: The extracranial vessels were near-normal with only minimal wall thickening or plaque. Vertebrals: Left vertebral artery was patent with antegrade flow.  Right vertebral artery was not visualized. Subclavians: Normal flow hemodynamics were seen in the right subclavian artery.  Left subclavian artery exhibits monophasic flow, suggestive of possbile proximal obstruction     IMPRESSION: [name] is a 52 y.o. male with PMH of ESRD on Dialysis, HTN, Hx of CVA without deficits, BPH and Depression that presents to the ED as a code stroke with reports of acute onset Left sided weakness, Left facial droop, decreased sensation on the Left, slightly dysarthric speech. . EMS reports that patient fell out of his bed at approximately 15:40. Patients room mate states that he last saw him normal at 07:00 this morning. CT Head reveals:  Acute right thalamic hemorrhage with mild cytotoxic edema New age indeterminate left  thalamic lacunar infarct  Suspected Etiology: HTN hemorrhage and small vessel disease Resultant Symptoms: Left sided weakness, Left facial droop, decreased sensation on Left Stroke Risk Factors: hypertension, ESRD on Dialysis Other Stroke Risk Factors: Advanced age, ?ETOH use, Hx stroke  Outstanding Stroke Work-up Studies:   Work up completed at this time  PLAN  12/23/2017: Awaiting bed in SNF for discharge Frequent Neurochecks  Telemetry Monitoring Continue to HOLD Aspirin as patient has a right ICH For worsening mentation, obtain STAT Head CT- and contact Neurology immediately F/U PT/OT/SLP at SNF Ongoing aggressive stroke risk factor management Patient will be counseled to be compliant with his antithrombotic medications Patient will be counseled on Lifestyle modifications including, Diet, Exercise, and Stress Follow up with [facility] in 6 weeks  HX OF STROKES:  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  DYSPHAGIA:  Passed SLP swallow evaluation- Dysphagia 3 diet  Aspiration Precautions in progress  MEDICAL ISSUES:  ESRD on Dialysis - HD in AM (Thursday)  Stable, Nephrology following, [place]  HD today  Creatinine - 6.14  Anemia Chronic  Stable, trending up slowly   Iron Studies in progress  Nephrology to start ESA, IV Iron, Aranesp qHD  Repeat labs in AM  Hyperkalemia Stable - HD today - K+4.5 Repeat labs in AM  Hyponatremia  Stable, Nephrology following  Na 129  Hypocalcemia - Secondary Hyperparathyroidism  Stable, Nephrology following   8.9  Elevated Alk Phos  Stable, Nephrology following  12/17/2017 - 253  Elevated Phosphorus - resolved Stable, Nephrology following 4.5  Leg Spasms  Zanaflex BID PRN  Nausea Zofran PRN Will continue to monitor closely  Diarrhea- Resolved  Formed BM 12/22/2017  Imodium PRN  HYPERTENSION:  Stable, slowly trending down, Will continue to monitor closely  SBP goal  less than 140/90 most of the time  Labetolol PRN  Long term BP goal normotensive.  Patient currently on: Norvasc, Hydralazine  HYPERLIPIDEMIA  Home Meds:  NONE  LDL  goal < 70, LDL 73  Will start Lipitor 10 mg  Continue statin at discharge  DIABETES:  HgbA1c goal < 7.0, HgbA1C = 4.0  Currently on:  Novolog  Continue CBG monitoring and SSI to maintain glucose 140-180 mg/dl  DM education   Other Active Problems: Active Problems:   IVH (intraventricular hemorrhage) (HCC)   Dysphagia, post-stroke   Type 2 diabetes mellitus with peripheral neuropathy (HCC)   History of tobacco abuse   Benign essential HTN   ESRD on dialysis ([place])   History of traumatic brain injury   History of CVA with residual deficit   History of transmetatarsal amputation of foot ([place])   ICH (intracerebral hemorrhage) ([place])    Hospital day # 16 VTE prophylaxis: SCD's  Diet - Fall precautions Diet renal/carb modified with fluid restriction Diet-HS Snack? Nothing; Fluid restriction: 1200 mL Fluid; Room service appropriate? Yes; Fluid consistency: Thin   FAMILY UPDATES: No family at bedside  TEAM UPDATES:Kristopher Delk, Lucy [place], MD   Prior Home Stroke Medications:  No antithrombotic  Discharge Stroke Meds:  Please discharge patient on TBD , will likely add ASA prior to discharge  Disposition: [facility]   Therapy Recs:   12/23/2017, Discussion with MSW,  Discharge to [facility] -Pending set up with outpatient Hemodialysis. Patient has been able to sit up in chair x 4 hours per day per nursing. No new updates on possibility to discharge  Follow Up:  Follow-up Information    Garvin Fila, MD. Schedule an appointment as soon as possible for a visit in 6 week(s).   Specialties:  Neurology, Radiology Contact information: [address] [place] [address] [place], [place], [place] -PCP Follow up in 1-2 weeks          Mary Sella, ANP-C Neurology Stroke  Team 12/23/2017 2:00 PM I have personally examined this patient, reviewed notes, independently viewed imaging studies, participated in medical decision making and plan of care.ROS completed by me personally and pertinent positives fully documented  I have made any additions or clarifications directly to the above note. Agree with note above.  Antony Contras, MD Medical Director [facility] Pager: [phone] 12/23/2017 2:27 PM   Please page stroke NP  Or  PA  Or MD from 8am -4 pm  as this patient from this time will be  followed by the stroke.   You can look them up on [email or web address]  Password [email or web address]

## 2017-12-24 LAB — CBC
HCT: 32.6 % — ABNORMAL LOW (ref 39.0–52.0)
HEMOGLOBIN: 10.6 g/dL — AB (ref 13.0–17.0)
MCH: 29.4 pg (ref 26.0–34.0)
MCHC: 32.5 g/dL (ref 30.0–36.0)
MCV: 90.3 fL (ref 78.0–100.0)
PLATELETS: 323 10*3/uL (ref 150–400)
RBC: 3.61 MIL/uL — ABNORMAL LOW (ref 4.22–5.81)
RDW: 18.4 % — ABNORMAL HIGH (ref 11.5–15.5)
WBC: 7.4 10*3/uL (ref 4.0–10.5)

## 2017-12-24 LAB — BASIC METABOLIC PANEL
Anion gap: 16 — ABNORMAL HIGH (ref 5–15)
BUN: 45 mg/dL — ABNORMAL HIGH (ref 6–20)
CALCIUM: 8.4 mg/dL — AB (ref 8.9–10.3)
CO2: 23 mmol/L (ref 22–32)
Chloride: 91 mmol/L — ABNORMAL LOW (ref 101–111)
Creatinine, Ser: 7.63 mg/dL — ABNORMAL HIGH (ref 0.61–1.24)
GFR, EST AFRICAN AMERICAN: 8 mL/min — AB (ref >60)
GFR, EST NON AFRICAN AMERICAN: 7 mL/min — AB (ref >60)
Glucose, Bld: 91 mg/dL (ref 65–99)
Potassium: 4.3 mmol/L (ref 3.5–5.1)
Sodium: 130 mmol/L — ABNORMAL LOW (ref 135–145)

## 2017-12-24 LAB — GLUCOSE, CAPILLARY
GLUCOSE-CAPILLARY: 107 mg/dL — AB (ref 65–99)
Glucose-Capillary: 98 mg/dL (ref 65–99)
Glucose-Capillary: 126 mg/dL — ABNORMAL HIGH (ref 65–99)
Glucose-Capillary: 169 mg/dL — ABNORMAL HIGH (ref 65–99)

## 2017-12-24 LAB — PHOSPHORUS: PHOSPHORUS: 5.4 mg/dL — AB (ref 2.5–4.6)

## 2017-12-24 LAB — MAGNESIUM: MAGNESIUM: 1.7 mg/dL (ref 1.7–2.4)

## 2017-12-24 MED ORDER — DARBEPOETIN ALFA 200 MCG/0.4ML IJ SOSY
PREFILLED_SYRINGE | INTRAMUSCULAR | Status: AC
  Filled 2017-12-24: qty 0.4

## 2017-12-24 MED ORDER — CAMPHOR-MENTHOL 0.5-0.5 % EX LOTN
TOPICAL_LOTION | CUTANEOUS | Status: DC | PRN
  Administered 2017-12-24: 17:00:00 via TOPICAL
  Administered 2017-12-28: 1 via TOPICAL
  Filled 2017-12-24: qty 222

## 2017-12-24 NOTE — Progress Notes (Signed)
PT Cancellation Note  Patient Details Name: Wesley Burns MRN: [id number] DOB: 06/21/1966   Cancelled Treatment:    Reason Eval/Treat Not Completed: (P) Patient at procedure or test/unavailable Pt is currently in HD. PT will check back as able this afternoon.  Ben Sanz B. Beverely Risen PT, DPT Acute Rehabilitation  [phone] Pager [phone]   Elon Alas Fleet 12/24/2017, 1:34 PM

## 2017-12-24 NOTE — Progress Notes (Signed)
[facility] Progress Note   Subjective:   Seen while on HD. C/os his back itching.   Objective Vitals:   12/24/17 0930 12/24/17 1000 12/24/17 1015 12/24/17 1030  BP: (!) 174/89 (!) 160/81 136/73 (!) 160/82  Pulse: 92 91 92 96  Resp: 16 15 16 15   Temp:      TempSrc:      SpO2:      Weight:      Height:       Physical Exam General: NAD, sitting in dialysis chair, L side weakness/facial droop Heart: RRR Lungs: mostly CTAB, bibasilar rales Abdomen:soft, NTND Extremities:no edema Dialysis Access: LU AVF cannulated   Filed Weights   12/18/17 1622 12/20/17 1617 12/24/17 0755  Weight: 64.3 kg (141 lb 12.1 oz) 28.8 kg (63 lb 6.4 oz) 61 kg (134 lb 7.7 oz)    Intake/Output Summary (Last 24 hours) at 12/24/2017 1052 Last data filed at 12/24/2017 0027 Gross per 24 hour  Intake 240 ml  Output 600 ml  Net -360 ml    Additional Objective Labs: Basic Metabolic Panel: Recent Labs  Lab 12/18/17 0854 12/20/17 1109 12/24/17 0500  NA 129* 129* 130*  K 4.6 4.5 4.3  CL 93* 94* 91*  CO2 22 22 23   GLUCOSE 164* 111* 91  BUN 41* 34* 45*  CREATININE 6.73* 6.14* 7.63*  CALCIUM 8.3* 8.9 8.4*  PHOS 5.2* 4.5 5.4*   Liver Function Tests: Recent Labs  Lab 12/18/17 0854 12/20/17 1109  ALBUMIN 3.1* 3.2*   CBC: Recent Labs  Lab 12/18/17 0854 12/20/17 1109 12/24/17 0500  WBC 4.6 6.3 7.4  HGB 9.9* 10.0* 10.6*  HCT 30.1* 30.9* 32.6*  MCV 89.1 91.2 90.3  PLT 244 298 323    CBG: Recent Labs  Lab 12/23/17 1147 12/23/17 1744 12/23/17 2108 12/24/17 0607 12/24/17 1042  GLUCAP 135* 96 180* 98 126*    Studies/Results: No results found.  Medications: . ferric gluconate (FERRLECIT/NULECIT) IV 125 mg (12/24/17 0921)   .  stroke: mapping our early stages of recovery book   Does not apply Once  . amLODipine  10 mg Oral QHS  . atorvastatin  10 mg Oral q1800  . calcitRIOL  0.75 mcg Oral Once per day on Tue Thu Sat  . darbepoetin (ARANESP) injection - DIALYSIS   200 mcg Intravenous Q Tue-HD  . hydrALAZINE  50 mg Oral Q8H  . insulin aspart  0-9 Units Subcutaneous TID WC  . multivitamin  1 tablet Oral QHS  . pantoprazole  40 mg Oral QHS  . polyethylene glycol  17 g Oral Daily  . senna-docusate  1 tablet Oral BID  . 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 ESRD- HD rolled over to today due to patient volume/urgent pts. HD in chair. Resume TTS schedule. 3. Anemiaof CKD-last Hgb 10.6. On IV iron and Aranesp qHD (Tues) Monitor. 4. Secondary hyperparathyroidism- Ca 8.4, P 5.4. Continue VDRA and binders. 5.HTN/volume- BP improving with HD. Under EDW. Will need new EDW with d/c.  6. Nutrition- Renal diet, rena-vite, Alb 3.2, Nepro TID.  7. Type 2 DM - per primary 8. Dispo - to SNF, per primary. 9 NONADHERENCE TO HD, MEDs    72m, PA-C  Pager: [phone] 12/24/2017,10:52 AM  LOS: 17 days   Pt seen, examined and agree w A/P as above. BP's much better, down 8kg under prior dry wt.  Vinson Moselle MD [employer] pager [phone]   12/24/2017, 2:48 PM

## 2017-12-24 NOTE — Progress Notes (Signed)
RN called HD RN to obtain AM labs

## 2017-12-24 NOTE — Progress Notes (Signed)
Pt left floor for HD.

## 2017-12-24 NOTE — Plan of Care (Signed)
  Education: Knowledge of General Education information will improve 12/24/2017 0554 - Progressing by Olena Mater, RN Note POC reviewed with pt.

## 2017-12-24 NOTE — Progress Notes (Signed)
NEUROHOSPITALISTS - STROKE TEAM DAILY PROGRESS NOTE   SUBJECTIVE No family at bedside this morning. No complaint of nausea this AM. HD in progress today. Awaiting nursing home placement. Patient is occasionally moving Left side. Moving thumb today on Left hand   Physical Examination: Vitals:   12/24/17 1059 12/24/17 1130 12/24/17 1159 12/24/17 1202  BP: (!) 157/83 140/75 (!) 158/86 139/78  Pulse: 98 (!) 101 98 99  Resp: 17 16 17 16   Temp:    98.7 F (37.1 C)  TempSrc:    Oral  SpO2:    97%  Weight:    57 kg (125 lb 10.6 oz)  Height:       HEENT-  Normocephalic,  Cardiovascular - Regular rate and rhythm, + murmur Respiratory - Lungs clear bilaterally. Non-labored breathing, No wheezing. Abdomen - soft and non-tender, BS normal Extremities- no edema or cyanosis Skin-warm and dry  Neurological Examination Mental Status: Awake,oriented x 3  Speech is slightly dysarthric. Able to follow 3 step commands without difficulty. Cranial Nerves: II: Left field cut. Pupils are equal, round, and reactive to light.   III,IV, VI: EOMI without ptosis or diploplia. No nystagmus.   V: Facial sensation is decreased on the Left VII: Facial movement is decreased on the Left VIII: hearing is intact to voice X: Uvula elevates symmetrically XI: Shoulder shrug is decreased on the left XII: tongue is midline without atrophy or fasciculations.  Motor: Tone is normal. Bulk is normal. 5/5 strength on the Right.  Dense left hemiplegia and Strength is 1/5 on the Left leg and 0-1/5 in left  arm   Sensor: Sensation is decreased on the Left in the arms and legs. Normal on the Right Deep Tendon Reflexes: 1+ and symmetric throughout in the biceps and patellae Plantars: Toes are downgoing bilaterally.  Cerebellar: unable to perform finger-to-nose on Left Gait: Truncal ataxia to Left   Lab Results: CBC: Recent Labs  Lab 12/18/17 0854 12/20/17 1109 12/24/17 0500  WBC 4.6 6.3 7.4  HGB  9.9* 10.0* 10.6*  HCT 30.1* 30.9* 32.6*  MCV 89.1 91.2 90.3  PLT 244 298 371   Basic Metabolic Panel: Recent Labs  Lab 12/18/17 0854 12/20/17 1109 12/24/17 0500  NA 129* 129* 130*  K 4.6 4.5 4.3  CL 93* 94* 91*  CO2 22 22 23   GLUCOSE 164* 111* 91  BUN 41* 34* 45*  CREATININE 6.73* 6.14* 7.63*  CALCIUM 8.3* 8.9 8.4*  MG  --   --  1.7  PHOS 5.2* 4.5 5.4*   Liver Function Tests: Recent Labs  Lab 12/18/17 0854 12/20/17 1109  ALBUMIN 3.1* 3.2*   Imaging:  Ct Head Code Stroke Wo Contrast Result Date: 12/07/2017 IMPRESSION:  1. Acute right thalamic hemorrhage. Mild edema and local mass effect.  2. 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  3. New age indeterminate left thalamic lacunar infarct.   Ct Angio Head W Or Wo Contrast Result Date: 12/07/2017 IMPRESSION: 1. No aneurysm, vascular malformation, or spot sign associated with the right thalamic hemorrhage. 2. Patent circle of Willis with intracranial atherosclerosis as above.  Moderate to severe right PCA stenoses.   Ct Head Wo Contrast Result Date: 12/08/2017 IMPRESSION:  1. Stable right thalamic hematoma and small volume of hemorrhage in right lateral ventricle.  2. Mild increase of mass effect with 5 mm right-to-left midline shift, previously 4 mm.  3. No new acute intracranial abnormality identified.   ECHO  12/12/2017 Study Conclusions -  Left ventricle: The cavity size was normal. There was moderate   concentric hypertrophy. Systolic function was normal. The   estimated ejection fraction was in the range of 55% to 60%. Wall   motion was normal; there were no regional wall motion   abnormalities. There was a reduced contribution of atrial   contraction to ventricular filling, due to increased ventricular   diastolic pressure or atrial contractile dysfunction. Doppler   parameters are consistent with a reversible restrictive pattern,   indicative of decreased left  ventricular diastolic compliance   and/or increased left atrial pressure (grade 3 diastolic   dysfunction). Doppler parameters are consistent with high   ventricular filling pressure. - Aortic valve: Trileaflet; mildly thickened, mildly calcified   leaflets. Moderate focal calcification involving the left   coronary cusp. - Aorta: Ascending aorta diameter: 40 mm (ED). - Ascending aorta: The ascending aorta was mildly dilated. - Mitral valve: Calcified annulus. There was mild regurgitation. - Left atrium: The atrium was mildly dilated. - Right ventricle: The cavity size was mildly dilated. Wall   thickness was normal. - Tricuspid valve: There was trivial regurgitation  Carotid Duplex           12/11/2017 Final Interpretation: Right Carotid:  The extracranial vessels were near-normal with only minimal wall thickening or plaque. Left Carotid: The extracranial vessels were near-normal with only minimal wall thickening or plaque. Vertebrals: Left vertebral artery was patent with antegrade flow.  Right vertebral artery was not visualized. Subclavians: Normal flow hemodynamics were seen in the right subclavian artery.  Left subclavian artery exhibits monophasic flow, suggestive of possbile proximal obstruction     IMPRESSION: [name] is a 52 y.o. male with PMH of ESRD on Dialysis, HTN, Hx of CVA without deficits, BPH and Depression that presents to the ED as a code stroke with reports of acute onset Left sided weakness, Left facial droop, decreased sensation on the Left, slightly dysarthric speech. . EMS reports that patient fell out of his bed at approximately 15:40. Patients room mate states that he last saw him normal at 07:00 this morning. CT Head reveals:  Acute right thalamic hemorrhage with mild cytotoxic edema New age indeterminate left thalamic lacunar infarct  Suspected Etiology: HTN hemorrhage and small vessel disease Resultant Symptoms: Left sided weakness, Left  facial droop, decreased sensation on Left Stroke Risk Factors: hypertension, ESRD on Dialysis Other Stroke Risk Factors: Advanced age, ?ETOH use, Hx stroke  Outstanding Stroke Work-up Studies:   Work up completed at this time  PLAN  12/24/2017: Awaiting bed in SNF for discharge Frequent Neurochecks  Telemetry Monitoring Continue to HOLD Aspirin as patient has a right ICH For worsening mentation, obtain STAT Head CT- and contact Neurology immediately F/U PT/OT/SLP at SNF Ongoing aggressive stroke risk factor management Patient counseled to be compliant with his antithrombotic medications Patient counseled on Lifestyle modifications including, Diet, Exercise, and Stress Follow up with [facility] in 6 weeks  HX OF STROKES:  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  DYSPHAGIA:  Passed SLP swallow evaluation- Dysphagia 3 diet  Aspiration Precautions in progress  MEDICAL ISSUES:  ESRD on Dialysis - HD in AM (Thursday)  Stable, Nephrology following, Appreciate Assistance  HD today  Creatinine - 7.63  Anemia Chronic  Stable, trending up slowly   Iron Studies in progress  Nephrology to start ESA, IV Iron, Aranesp qHD  Repeat labs in AM  Hyperkalemia Stable - HD today - K+4.5 Repeat labs in AM  Hyponatremia  Stable, Nephrology following  Na 130  Hypocalcemia - Secondary Hyperparathyroidism  Stable, Nephrology following   8.4  Elevated Alk Phos  Stable, Nephrology following  12/17/2017 - 253  Elevated Phosphorus  Stable, Nephrology following 5.4  Leg Spasms  Zanaflex BID PRN  Nausea Zofran PRN Will continue to monitor closely  Diarrhea- Resolved  Formed BM 12/22/2017  Imodium PRN  HYPERTENSION:  Stable, slowly trending down, better after HD  SBP goal less than 140/90 most of the time  Labetolol PRN  Long term BP goal normotensive.  Patient currently on: Norvasc, Hydralazine  HYPERLIPIDEMIA  Home Meds:  NONE  LDL   goal < 70, LDL 73  Will start Lipitor 10 mg  Continue statin at discharge  DIABETES:  HgbA1c goal < 7.0, HgbA1C = 4.0  Currently on:  Novolog  Continue CBG monitoring and SSI to maintain glucose 140-180 mg/dl  DM education   Other Active Problems: Active Problems:   IVH (intraventricular hemorrhage) (HCC)   Dysphagia, post-stroke   Type 2 diabetes mellitus with peripheral neuropathy (HCC)   History of tobacco abuse   Benign essential HTN   ESRD on dialysis ([place])   History of traumatic brain injury   History of CVA with residual deficit   History of transmetatarsal amputation of foot ([place])   ICH (intracerebral hemorrhage) ([place])    Hospital day # 17 VTE prophylaxis: SCD's  Diet - Fall precautions Diet renal/carb modified with fluid restriction Diet-HS Snack? Nothing; Fluid restriction: 1200 mL Fluid; Room service appropriate? Yes; Fluid consistency: Thin   FAMILY UPDATES: No family at bedside  TEAM UPDATES:Mateen Franssen, Lucy [place], MD   Prior Home Stroke Medications:  No antithrombotic  Discharge Stroke Meds:  Please discharge patient on TBD , will likely add ASA prior to discharge  Disposition: [facility]   Therapy Recs:   12/24/2017, Discussion with MSW,  Discharge to [facility] -Pending set up with outpatient Hemodialysis. Patient has been able to sit up in chair x 4 hours per day without difficulties. No new updates on possibility to discharge  Follow Up:  Follow-up Information    Garvin Fila, MD. Schedule an appointment as soon as possible for a visit in 6 week(s).   Specialties:  Neurology, Radiology Contact information: [address] [place] [address] [place], [place], MD -PCP Follow up in 1-2 weeks      Assessment & plan discussed with with attending physician and they are in agreement   Renie Ora Neurology Stroke Team 12/24/2017 2:55 PM I have personally examined this patient, reviewed notes,  independently viewed imaging studies, participated in medical decision making and plan of care.ROS completed by me personally and pertinent positives fully documented  I have made any additions or clarifications directly to the above note. Agree with note above.   Antony Contras, MD Medical Director [facility] Pager: [phone] 12/24/2017 3:17 PM  Please page stroke NP  Or  PA  Or MD from 8am -4 pm  as this patient from this time will be  followed by the stroke.   You can look them up on [email or web address]  Password [email or web address]

## 2017-12-24 NOTE — Progress Notes (Signed)
Physical Therapy Treatment Patient Details Name: Wesley Burns MRN: [id number] DOB: 04/04/1966 Today's Date: 12/24/2017    History of Present Illness 52 y.o. male with ESRD, Type 2 DM, HTN, GERD, and Hx of Right basal ganglia CVA (2017), right transmet amputation. Admitted with right thalamic hemorrhagic stroke.    PT Comments    Pt is making good progress towards his goals today, however continues to be limited by L sided weakness. Pt very excited to show therapist his ability to move his L thumb. Pt is currently modA for bed mobility, modAx2 for transfers. Pt also able to attempt ambulation with L platform walker, requiring maxAx2 for physical assist and maximal vc for sequencing and weightshift. D/c plan remains appropriate.  PT will continue to follow acutely until d/c.     Follow Up Recommendations  Supervision/Assistance - 24 hour;SNF     Equipment Recommendations  Other (comment)(TBD)    Recommendations for Other Services       Precautions / Restrictions Precautions Precautions: Fall Restrictions Weight Bearing Restrictions: No    Mobility  Bed Mobility Overal bed mobility: Needs Assistance Bed Mobility: Supine to Sit     Supine to sit: Mod assist Sit to supine: Min assist   General bed mobility comments: ModA for movement of LE off bed, pt c/o hip pain when moving LE individually, utilized pad to scoot hips together, minA for LE management back into bed  Transfers Overall transfer level: Needs assistance Equipment used: 1 person hand held assist;Left platform walker Transfers: Stand Pivot Transfers Sit to Stand: Mod assist;+2 physical assistance Stand pivot transfers: Mod assist       General transfer comment: modA for stand pivot transfer to [facility], modAx2 for sit>stand from [facility] to L platform walker  Ambulation/Gait Ambulation/Gait assistance: Max assist;+2 physical assistance Ambulation Distance (Feet): 2 Feet Assistive device: Left platform  walker Gait Pattern/deviations: Step-to pattern;Decreased step length - left;Decreased weight shift to left;Shuffle;Trunk flexed Gait velocity: slowed Gait velocity interpretation: Below normal speed for age/gender General Gait Details: MaxAx2 for stepping from [facility] to bed, vc for weightshift to RLE to advance L LE, and for increased use of L UE through platform       Balance Overall balance assessment: Needs assistance Sitting-balance support: Single extremity supported;Feet supported Sitting balance-Leahy Scale: Fair Sitting balance - Comments: Pt able to sit in EoB without support for short periods of time. Pt able to tolerate only very slight changes, pt attempt to use R arm to scratch back and lost balance to left    Standing balance support: Bilateral upper extremity supported Standing balance-Leahy Scale: Zero                              Cognition Arousal/Alertness: Awake/alert Behavior During Therapy: WFL for tasks assessed/performed Overall Cognitive Status: Impaired/Different from baseline Area of Impairment: Following commands;Safety/judgement;Awareness;Problem solving;Attention                   Current Attention Level: Divided   Following Commands: Follows multi-step commands consistently Safety/Judgement: Decreased awareness of safety Awareness: Anticipatory Problem Solving: Slow processing;Decreased initiation;Difficulty sequencing;Requires verbal cues;Requires tactile cues General Comments: greater focus today and able to follow multistep commands pretty consistently         General Comments General comments (skin integrity, edema, etc.): Pt excited to show therapist that he can now move his L thumb       Pertinent Vitals/Pain Pain Assessment: No/denies pain  PT Goals (current goals can now be found in the care plan section) Acute Rehab PT Goals Patient Stated Goal: return home, read PT Goal Formulation: With patient Time  For Goal Achievement: 12/22/17 Potential to Achieve Goals: Good Progress towards PT goals: Progressing toward goals    Frequency    Min 3X/week      PT Plan Current plan remains appropriate       AM-PAC PT "6 Clicks" Daily Activity  Outcome Measure  Difficulty turning over in bed (including adjusting bedclothes, sheets and blankets)?: Unable Difficulty moving from lying on back to sitting on the side of the bed? : Unable Difficulty sitting down on and standing up from a chair with arms (e.g., wheelchair, bedside commode, etc,.)?: Unable Help needed moving to and from a bed to chair (including a wheelchair)?: Total Help needed walking in hospital room?: Total Help needed climbing 3-5 steps with a railing? : Total 6 Click Score: 6    End of Session Equipment Utilized During Treatment: Gait belt Activity Tolerance: Patient tolerated treatment well Patient left: in bed;with call bell/phone within reach;with bed alarm set;with nursing/sitter in room;with family/visitor present Nurse Communication: Mobility status PT Visit Diagnosis: Unsteadiness on feet (R26.81);Other abnormalities of gait and mobility (R26.89);Hemiplegia and hemiparesis Hemiplegia - Right/Left: Left Hemiplegia - dominant/non-dominant: Non-dominant Hemiplegia - caused by: Other Nontraumatic intracranial hemorrhage     Time: 1420-1445 PT Time Calculation (min) (ACUTE ONLY): 25 min  Charges:  $Therapeutic Activity: 23-37 mins                    G Codes:       Sanjuana Mruk B. Beverely Risen PT, DPT Acute Rehabilitation  [phone] Pager [phone]     Elon Alas Fleet 12/24/2017, 4:51 PM

## 2017-12-25 LAB — CBC
HCT: 35.8 % — ABNORMAL LOW (ref 39.0–52.0)
Hemoglobin: 11.5 g/dL — ABNORMAL LOW (ref 13.0–17.0)
MCH: 29.7 pg (ref 26.0–34.0)
MCHC: 32.1 g/dL (ref 30.0–36.0)
MCV: 92.5 fL (ref 78.0–100.0)
Platelets: 325 10*3/uL (ref 150–400)
RBC: 3.87 MIL/uL — ABNORMAL LOW (ref 4.22–5.81)
RDW: 19 % — AB (ref 11.5–15.5)
WBC: 5.7 10*3/uL (ref 4.0–10.5)

## 2017-12-25 LAB — RENAL FUNCTION PANEL
ALBUMIN: 3.6 g/dL (ref 3.5–5.0)
ANION GAP: 14 (ref 5–15)
BUN: 21 mg/dL — ABNORMAL HIGH (ref 6–20)
CALCIUM: 9 mg/dL (ref 8.9–10.3)
CO2: 26 mmol/L (ref 22–32)
CREATININE: 5.48 mg/dL — AB (ref 0.61–1.24)
Chloride: 89 mmol/L — ABNORMAL LOW (ref 101–111)
GFR, EST AFRICAN AMERICAN: 12 mL/min — AB (ref >60)
GFR, EST NON AFRICAN AMERICAN: 11 mL/min — AB (ref >60)
Glucose, Bld: 100 mg/dL — ABNORMAL HIGH (ref 65–99)
PHOSPHORUS: 5.2 mg/dL — AB (ref 2.5–4.6)
Potassium: 4.3 mmol/L (ref 3.5–5.1)
SODIUM: 129 mmol/L — AB (ref 135–145)

## 2017-12-25 LAB — GLUCOSE, CAPILLARY: Glucose-Capillary: 105 mg/dL — ABNORMAL HIGH (ref 65–99)

## 2017-12-25 MED ORDER — LIDOCAINE HCL (PF) 1 % IJ SOLN: 5.0000 mL | INTRAMUSCULAR | Status: DC | PRN

## 2017-12-25 MED ORDER — SODIUM CHLORIDE 0.9 % IV SOLN: 100.0000 mL | INTRAVENOUS | Status: DC | PRN

## 2017-12-25 MED ORDER — CLONIDINE HCL 0.1 MG PO TABS
0.1000 mg | ORAL_TABLET | Freq: Two times a day (BID) | ORAL | Status: DC
  Administered 2017-12-25 – 2017-12-26 (×2): 0.1 mg via ORAL
  Filled 2017-12-25 (×2): qty 1

## 2017-12-25 MED ORDER — PENTAFLUOROPROP-TETRAFLUOROETH EX AERO: 1.0000 "application " | INHALATION_SPRAY | CUTANEOUS | Status: DC | PRN

## 2017-12-25 MED ORDER — ALTEPLASE 2 MG IJ SOLR
2.0000 mg | Freq: Once | INTRAMUSCULAR | Status: DC | PRN
  Filled 2017-12-25: qty 2

## 2017-12-25 MED ORDER — HEPARIN SODIUM (PORCINE) 1000 UNIT/ML DIALYSIS
1000.0000 [IU] | INTRAMUSCULAR | Status: DC | PRN
  Filled 2017-12-25: qty 1

## 2017-12-25 MED ORDER — LIDOCAINE-PRILOCAINE 2.5-2.5 % EX CREA
1.0000 "application " | TOPICAL_CREAM | CUTANEOUS | Status: DC | PRN
  Filled 2017-12-25: qty 5

## 2017-12-25 NOTE — Progress Notes (Signed)
Dialysis treatment completed.  3500 mL ultrafiltrated and net fluid removal 3000 mL.    Patient A & O X 4. Lung sounds diminished to ausculation in all fields. No edema. Cardiac: NSR.  Disconnected lines and removed needles.  Pressure held for 10 minutes and band aid/gauze dressing applied.  Report given to bedside RN, Marge.

## 2017-12-25 NOTE — Progress Notes (Signed)
Initial Nutrition Assessment  DOCUMENTATION CODES:   Underweight  INTERVENTION:  1. Nepro Shake po BID, each supplement provides 425 kcal and 19 grams protein  NUTRITION DIAGNOSIS:   Unintentional weight loss related to acute illness as evidenced by percent weight loss  GOAL:   Patient will meet greater than or equal to 90% of their needs  MONITOR:   PO intake, I & O's, Labs, Weight trends, Supplement acceptance  REASON FOR ASSESSMENT:   LOS    ASSESSMENT:   Wesley Burns is a 52 y.o. male with PMH of ESRD on Dialysis, HTN, Hx of CVA without deficits, BPH and Depression that presents to the ED as a code stroke with reports of acute onset Left sided weakness, Left facial droop, decreased sensation on the Left, slightly dysarthric speech. Found with acute right thalamic hemorrhage, and left thalamic lacunar infarct  Spoke with [name] while he was in HD. 3L Fluid removed today, now down to 119 pounds. Patient has been losing weight since admission. States his UBW is 140 pounds, which would indicate a 21 pound/15% severe weight loss. Per chart, patient was 150 pounds upon admit, but it is unclear if this was his dry weight. Had a stroke upon admit with residual L sided weakness, which likely contributed to his muscle and weight loss. Has been eating well, had eggs and pancakes this morning. Also states he drank a NePro and had a panini during that dialysis. Seems to be eating adequate food but continues to lose weight. Unable to dx malnutrition as patient has lost significant weight since admission, unsure if muscle and weight loss is related to weight loss during admission, or was the case upon admit. Patient was not an MST, no consults received about patient's nutrition status upon admit.   Labs reviewed:  Na 129  Medications reviewed and include:  Calcitriol, Insulin, Renavit, Miralax, Senokot-S, Renvela Iron gtt during HD  NUTRITION - FOCUSED PHYSICAL EXAM:    Most  Recent Value  Orbital Region  Moderate depletion  Upper Arm Region  Moderate depletion  Thoracic and Lumbar Region  No depletion  Buccal Region  Mild depletion  Temple Region  Mild depletion  Clavicle Bone Region  Moderate depletion  Clavicle and Acromion Bone Region  Moderate depletion  Scapular Bone Region  Moderate depletion  Dorsal Hand  Severe depletion  Patellar Region  Severe depletion  Anterior Thigh Region  Severe depletion  Posterior Calf Region  Severe depletion  Edema (RD Assessment)  Moderate       Diet Order:  Fall precautions Diet renal/carb modified with fluid restriction Diet-HS Snack? Nothing; Fluid restriction: 1200 mL Fluid; Room service appropriate? Yes; Fluid consistency: Thin  EDUCATION NEEDS:   Education needs have been addressed  Skin:  Skin Assessment: Skin Integrity Issues: Skin Integrity Issues:: Stage II Stage II: to ball of L foot  Last BM:  12/24/2017  Height:   Ht Readings from Last 1 Encounters:  12/07/17 5\' 11"  (1.803 m)    Weight:   Wt Readings from Last 1 Encounters:  12/25/17 119 lb 0.8 oz (54 kg)    Ideal Body Weight:  78.18 kg  BMI:  Body mass index is 16.6 kg/m.  Estimated Nutritional Needs:   Kcal:  12/27/17 calories (ABW x30-35)  Protein:  82-95 grams  Fluid:  UOP +1L  [id number]. Wesley Schwalm, MS, RD LDN Inpatient Clinical Dietitian Pager [phone]

## 2017-12-25 NOTE — Progress Notes (Addendum)
[facility] Progress Note   Subjective:   Seen in room, no c/o, no sob, cough , no swelling.   Objective Vitals:   12/24/17 2200 12/25/17 0200 12/25/17 0523 12/25/17 0944  BP: (!) 176/78 (!) 172/84 (!) 168/78 (!) 169/71  Pulse: 87 94 80 80  Resp: 18 18 18 19   Temp: 99.2 F (37.3 C) 98.6 F (37 C) 97.8 F (36.6 C) 98.4 F (36.9 C)  TempSrc: Oral Oral Oral Oral  SpO2: 94% 96% 96% 98%  Weight:      Height:       Physical Exam General: NAD, sitting in dialysis chair, L side weakness/facial droop Heart: RRR Lungs: mostly CTAB, bibasilar rales Abdomen:soft, NTND Extremities:no edema Dialysis Access: LU AVF cannulated   Filed Weights   12/20/17 1617 12/24/17 0755 12/24/17 1202  Weight: 28.8 kg (63 lb 6.4 oz) 61 kg (134 lb 7.7 oz) 57 kg (125 lb 10.6 oz)    Intake/Output Summary (Last 24 hours) at 12/25/2017 1123 Last data filed at 12/25/2017 0525 Gross per 24 hour  Intake -  Output 4100 ml  Net -4100 ml    Additional Objective Labs: Basic Metabolic Panel: Recent Labs  Lab 12/20/17 1109 12/24/17 0500  NA 129* 130*  K 4.5 4.3  CL 94* 91*  CO2 22 23  GLUCOSE 111* 91  BUN 34* 45*  CREATININE 6.14* 7.63*  CALCIUM 8.9 8.4*  PHOS 4.5 5.4*   Liver Function Tests: Recent Labs  Lab 12/20/17 1109  ALBUMIN 3.2*   CBC: Recent Labs  Lab 12/20/17 1109 12/24/17 0500  WBC 6.3 7.4  HGB 10.0* 10.6*  HCT 30.9* 32.6*  MCV 91.2 90.3  PLT 298 323    CBG: Recent Labs  Lab 12/24/17 0607 12/24/17 1042 12/24/17 1633 12/24/17 2208 12/25/17 0619  GLUCAP 98 126* 169* 107* 105*    Studies/Results: No results found.  Medications: . sodium chloride    . sodium chloride    . ferric gluconate (FERRLECIT/NULECIT) IV 125 mg (12/24/17 0921)   .  stroke: mapping our early stages of recovery book   Does not apply Once  . amLODipine  10 mg Oral QHS  . atorvastatin  10 mg Oral q1800  . calcitRIOL  0.75 mcg Oral Once per day on Tue Thu Sat  .  darbepoetin (ARANESP) injection - DIALYSIS  200 mcg Intravenous Q Tue-HD  . hydrALAZINE  50 mg Oral Q8H  . insulin aspart  0-9 Units Subcutaneous TID WC  . multivitamin  1 tablet Oral QHS  . pantoprazole  40 mg Oral QHS  . polyethylene glycol  17 g Oral Daily  . senna-docusate  1 tablet Oral BID  . sevelamer carbonate  800 mg Oral TID WC    Dialysis Orders: TTS at [facility] 4hr 65kg 2/2bath AVF Hep 2000 - Mircera 01-21-1998 IV q 2 weeks (last 1/3) - Calcitriol 0.20mcg PO q HD   Assessment: 1.R Thalamic Hemorrhagic Stroke (w/L sided deficits) - per Neuro 2. ESRD- HD TTS.  Per neuro wait 4 wks before resuming heparin w HD, wait longer if possible clinically.  3. Anemiaof CKD-last Hgb 10.6. On IV iron and 72m Aranesp qHD (Tues) Monitor. 4. 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 Continue VDRA and binders. 5.HTN/volume- on norvasc/ hydral, BP's still up, 8kg under prior dry wt 6. Nutrition- Renal diet, rena-vite, Alb 3.2, Nepro TID.  7. Type 2 DM - per primary 8. Dispo - to SNF, per primary. 9 NONADHERENCE TO HD, MEDs  P - HD today after rehab   Vinson Moselle MD [facility] pager [phone]   12/25/2017, 11:23 AM

## 2017-12-25 NOTE — Progress Notes (Signed)
NEUROHOSPITALISTS - STROKE TEAM DAILY PROGRESS NOTE   SUBJECTIVE No family at bedside this morning. HD in progress today. Voices no new complaints. Awaiting nursing home placement. Continues to have occasional weak movement of Left side.  Physical Examination: Vitals:   12/25/17 1200 12/25/17 1230 12/25/17 1300 12/25/17 1330  BP: (!) 169/96 (!) 172/93 (!) 137/92 (!) 170/84  Pulse: 85 90 92 94  Resp:      Temp:      TempSrc:      SpO2:      Weight:      Height:       HEENT-  Normocephalic,  Cardiovascular - Regular rate and rhythm, + murmur Respiratory - Lungs clear bilaterally. Non-labored breathing, No wheezing. Abdomen - soft and non-tender, BS normal Extremities- no edema or cyanosis Skin-warm and dry  Neurological Examination Mental Status: Awake,oriented x 3  Speech is slightly dysarthric. Able to follow 3 step commands without difficulty. Cranial Nerves: II: Left field cut. Pupils are equal, round, and reactive to light.   III,IV, VI: EOMI without ptosis or diploplia. No nystagmus.   V: Facial sensation is decreased on the Left VII: Facial movement is decreased on the Left VIII: hearing is intact to voice X: Uvula elevates symmetrically XI: Shoulder shrug is decreased on the left XII: tongue is midline without atrophy or fasciculations.  Motor: Tone is normal. Bulk is normal. 5/5 strength on the Right.  Dense left hemiplegia and Strength is 1/5 on the Left leg and 0-1/5 in left  arm   Sensor: Sensation is decreased on the Left in the arms and legs. Normal on the Right Deep Tendon Reflexes: 1+ and symmetric throughout in the biceps and patellae Plantars: Toes are downgoing bilaterally.  Cerebellar: unable to perform finger-to-nose on Left Gait: Truncal ataxia to Left   Lab Results: CBC: Recent Labs  Lab 12/20/17 1109 12/24/17 0500 12/25/17 1157  WBC 6.3 7.4 5.7  HGB 10.0* 10.6* 11.5*  HCT 30.9* 32.6* 35.8*  MCV 91.2 90.3 92.5  PLT 298  323 540   Basic Metabolic Panel: Recent Labs  Lab 12/20/17 1109 12/24/17 0500 12/25/17 1156  NA 129* 130* 129*  K 4.5 4.3 4.3  CL 94* 91* 89*  CO2 22 23 26   GLUCOSE 111* 91 100*  BUN 34* 45* 21*  CREATININE 6.14* 7.63* 5.48*  CALCIUM 8.9 8.4* 9.0  MG  --  1.7  --   PHOS 4.5 5.4* 5.2*   Liver Function Tests: Recent Labs  Lab 12/20/17 1109 12/25/17 1156  ALBUMIN 3.2* 3.6   Imaging:  Ct Head Code Stroke Wo Contrast Result Date: 12/07/2017 IMPRESSION:  1. Acute right thalamic hemorrhage. Mild edema and local mass effect.  2. 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  3. New age indeterminate left thalamic lacunar infarct.   Ct Angio Head W Or Wo Contrast Result Date: 12/07/2017 IMPRESSION: 1. No aneurysm, vascular malformation, or spot sign associated with the right thalamic hemorrhage. 2. Patent circle of Willis with intracranial atherosclerosis as above.  Moderate to severe right PCA stenoses.   Ct Head Wo Contrast Result Date: 12/08/2017 IMPRESSION:  1. Stable right thalamic hematoma and small volume of hemorrhage in right lateral ventricle.  2. Mild increase of mass effect with 5 mm right-to-left midline shift, previously 4 mm.  3. No new acute intracranial abnormality identified.   ECHO  12/12/2017 Study Conclusions - Left ventricle: The cavity size was normal. There was moderate   concentric  hypertrophy. Systolic function was normal. The   estimated ejection fraction was in the range of 55% to 60%. Wall   motion was normal; there were no regional wall motion   abnormalities. There was a reduced contribution of atrial   contraction to ventricular filling, due to increased ventricular   diastolic pressure or atrial contractile dysfunction. Doppler   parameters are consistent with a reversible restrictive pattern,   indicative of decreased left ventricular diastolic compliance   and/or increased left atrial pressure  (grade 3 diastolic   dysfunction). Doppler parameters are consistent with high   ventricular filling pressure. - Aortic valve: Trileaflet; mildly thickened, mildly calcified   leaflets. Moderate focal calcification involving the left   coronary cusp. - Aorta: Ascending aorta diameter: 40 mm (ED). - Ascending aorta: The ascending aorta was mildly dilated. - Mitral valve: Calcified annulus. There was mild regurgitation. - Left atrium: The atrium was mildly dilated. - Right ventricle: The cavity size was mildly dilated. Wall   thickness was normal. - Tricuspid valve: There was trivial regurgitation  Carotid Duplex           12/11/2017 Final Interpretation: Right Carotid:  The extracranial vessels were near-normal with only minimal wall thickening or plaque. Left Carotid: The extracranial vessels were near-normal with only minimal wall thickening or plaque. Vertebrals: Left vertebral artery was patent with antegrade flow.  Right vertebral artery was not visualized. Subclavians: Normal flow hemodynamics were seen in the right subclavian artery.  Left subclavian artery exhibits monophasic flow, suggestive of possbile proximal obstruction     IMPRESSION: [name] is a 52 y.o. male with PMH of ESRD on Dialysis, HTN, Hx of CVA without deficits, BPH and Depression that presents to the ED as a code stroke with reports of acute onset Left sided weakness, Left facial droop, decreased sensation on the Left, slightly dysarthric speech. . EMS reports that patient fell out of his bed at approximately 15:40. Patients room mate states that he last saw him normal at 07:00 this morning. CT Head reveals:  Acute right thalamic hemorrhage with mild cytotoxic edema New age indeterminate left thalamic lacunar infarct  Suspected Etiology: HTN hemorrhage and small vessel disease Resultant Symptoms: Left sided weakness, Left facial droop, decreased sensation on Left Stroke Risk Factors:  hypertension, ESRD on Dialysis Other Stroke Risk Factors: Advanced age, ?ETOH use, Hx stroke  Outstanding Stroke Work-up Studies:   Work up completed at this time  PLAN  12/25/2017: Awaiting bed in SNF for discharge Frequent Neurochecks  Telemetry Monitoring Continue to HOLD Aspirin as patient has a right ICH For worsening mentation, obtain STAT Head CT- and contact Neurology immediately F/U PT/OT/SLP at SNF Ongoing aggressive stroke risk factor management Patient counseled to be compliant with his antithrombotic medications Patient counseled on Lifestyle modifications including, Diet, Exercise, and Stress Follow up with [facility] in 6 weeks  HX OF STROKES:  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  DYSPHAGIA:  Passed SLP swallow evaluation- Dysphagia 3 diet  Aspiration Precautions in progress  MEDICAL ISSUES:  ESRD on Dialysis - HD in AM (Thursday)  Stable, Nephrology following, Appreciate Assistance  HD today  Creatinine - 5.48  Anemia Chronic  Stable, trending up slowly   Iron Studies in progress  Nephrology to start ESA, IV Iron, Aranesp qHD  Repeat labs in AM  Hyperkalemia Stable - HD today - K+4.3 Repeat labs in AM  Hyponatremia  Stable, Nephrology following  Na 129  Hypocalcemia - Secondary Hyperparathyroidism  Stable, Nephrology following   9.0  Elevated Alk Phos  Stable, Nephrology following  12/17/2017 - 253  Elevated Phosphorus  Stable, Nephrology following 5.2  Leg Spasms  Zanaflex BID PRN  Nausea Zofran PRN Will continue to monitor closely  Diarrhea- Resolved  Formed BM 12/22/2017  Imodium PRN  HYPERTENSION:  Stable, some elevated B/P's noted overnight  SBP goal less than 140/90 most of the time  Clonidine home dose added back today  Labetolol PRN  Long term BP goal normotensive.  Patient currently on: Norvasc, Hydralazine  HYPERLIPIDEMIA  Home Meds:  NONE  LDL  goal < 70, LDL 73  Will  start Lipitor 10 mg  Continue statin at discharge  DIABETES:  HgbA1c goal < 7.0, HgbA1C = 4.0  Currently on:  Novolog  Continue CBG monitoring and SSI to maintain glucose 140-180 mg/dl  DM education   Other Active Problems: Active Problems:   IVH (intraventricular hemorrhage) (HCC)   Dysphagia, post-stroke   Type 2 diabetes mellitus with peripheral neuropathy (HCC)   History of tobacco abuse   Benign essential HTN   ESRD on dialysis ([place])   History of traumatic brain injury   History of CVA with residual deficit   History of transmetatarsal amputation of foot ([place])   ICH (intracerebral hemorrhage) ([place])    Hospital day # 18 VTE prophylaxis: SCD's  Diet - Fall precautions Diet renal/carb modified with fluid restriction Diet-HS Snack? Nothing; Fluid restriction: 1200 mL Fluid; Room service appropriate? Yes; Fluid consistency: Thin   FAMILY UPDATES: No family at bedside  TEAM UPDATES:Sethi, Lucy [place], MD   Prior Home Stroke Medications:  No antithrombotic  Discharge Stroke Meds:  Please discharge patient on TBD , will likely add ASA prior to discharge  Disposition: [facility]   Therapy Recs:   12/25/2017, Discussion with MSW,  Discharge to [facility] -Pending set up with outpatient Hemodialysis. Patient has been able to sit up in chair x 4 hours per day without difficulties. No new updates on possibility to discharge  Follow Up:  Follow-up Information    Garvin Fila, MD. Schedule an appointment as soon as possible for a visit in 6 week(s).   Specialties:  Neurology, Radiology Contact information: [address] [place] [address] [place], [place], MD -PCP Follow up in 1-2 weeks      Assessment & plan discussed with with attending physician and they are in agreement   Renie Ora Neurology Stroke Team 12/25/2017 1:54 PM  Please page stroke NP  Or  PA  Or MD from 8am -4 pm  as this patient from this time will  be  followed by the stroke.   You can look them up on [email or web address]  Password [email or web address]

## 2017-12-25 NOTE — Progress Notes (Addendum)
Occupational Therapy Treatment Patient Details Name: Wesley Burns MRN: [id number] DOB: 02-20-1966 Today's Date: 12/25/2017    History of present illness 52 y.o. male with ESRD, Type 2 DM, HTN, GERD, and Hx of Right basal ganglia CVA (2017), right transmet amputation. Admitted with right thalamic hemorrhagic stroke.   OT comments  Pt seen today for ADL retraining session with focus on eating/self feeding with set-up at bed level and grooming bed level. Pt is overall set-up supervision for eating and Min-mod A using L UE as stabilizer/gross assist during grooming & UB dressing tasks. Cont with OT goals and plan of care for increased independence with ADL's.   Follow Up Recommendations  SNF    Equipment Recommendations  None recommended by OT    Recommendations for Other Services      Precautions / Restrictions Precautions Precautions: Fall Precaution Comments: left foot ulcer       Mobility Bed Mobility Overal bed mobility: (Pt seen for ADL's at bed level due to time of day)                Transfers                      Balance                                           ADL either performed or assessed with clinical judgement   ADL Overall ADL's : Needs assistance/impaired Eating/Feeding: Supervision/ safety;Bed level Eating/Feeding Details (indicate cue type and reason): Set up to open packages, cut food etc.  Grooming: Wash/dry hands;Wash/dry face;Bed level;Oral care;Brushing hair;Set up           Upper Body Dressing : Moderate assistance;Bed level                     General ADL Comments: Pt seen today for ADL retraining session with focus on eating/self feeding with set-up and grooming/UB Dressing at bed level. Set up only to open packages on breakfast tray and cut food. Pt then self feeds. Pt was also noted to take morning meds. Reviewed role of OT and goals with pt.     Vision Baseline Vision/History: Wears  glasses Wears Glasses: At all times     Perception     Praxis      Cognition Arousal/Alertness: Awake/alert Behavior During Therapy: WFL for tasks assessed/performed Overall Cognitive Status: Impaired/Different from baseline Area of Impairment: Following commands;Safety/judgement;Awareness;Problem solving;Attention                   Current Attention Level: Divided   Following Commands: Follows multi-step commands consistently Safety/Judgement: Decreased awareness of safety   Problem Solving: Slow processing;Decreased initiation;Difficulty sequencing;Requires verbal cues;Requires tactile cues          Exercises     Shoulder Instructions       General Comments      Pertinent Vitals/ Pain       Pain Assessment: No/denies pain  Home Living                                          Prior Functioning/Environment              Frequency  Min 2X/week  Progress Toward Goals  OT Goals(current goals can now be found in the care plan section)  Progress towards OT goals: Progressing toward goals  Acute Rehab OT Goals Patient Stated Goal: Return home  Plan      Co-evaluation                 AM-PAC PT "6 Clicks" Daily Activity     Outcome Measure   Help from another person eating meals?: A Little Help from another person taking care of personal grooming?: A Little Help from another person toileting, which includes using toliet, bedpan, or urinal?: A Lot Help from another person bathing (including washing, rinsing, drying)?: A Lot Help from another person to put on and taking off regular upper body clothing?: A Lot Help from another person to put on and taking off regular lower body clothing?: A Lot 6 Click Score: 14    End of Session    OT Visit Diagnosis: Unsteadiness on feet (R26.81);Muscle weakness (generalized) (M62.81);Hemiplegia and hemiparesis Hemiplegia - Right/Left: Left Hemiplegia - dominant/non-dominant:  Non-Dominant Hemiplegia - caused by: Cerebral infarction   Activity Tolerance Patient tolerated treatment well   Patient Left in bed;with call bell/phone within reach;with bed alarm set   Nurse Communication          Time: 4098-1191 OT Time Calculation (min): 49 min  Charges: OT General Charges $OT Visit: 1 Visit OT Treatments $Self Care/Home Management : 38-52 mins    Vielka Klinedinst Beth Dixon, OTR/L 12/25/2017, 9:06 AM

## 2017-12-25 NOTE — Progress Notes (Signed)
Patient arrived to unit per chair.  Reviewed treatment plan and this RN agrees.  Report received from bedside RN, Marge.  Consent verified.  Patient A & O X 4. Lung sounds diminished to ausculation in all fields. No edema. Cardiac: NSR.  Prepped LLAVF with alcohol and cannulated with two 15 gauge needles.  Pulsation of blood noted.  Flushed access well with saline per protocol.  Connected and secured lines and initiated tx at 1148.  UF goal of 3500 mL and net fluid removal of 3000 mL.  Will continue to monitor.

## 2017-12-26 ENCOUNTER — Inpatient Hospital Stay (HOSPITAL_COMMUNITY): Payer: Self-pay

## 2017-12-26 LAB — GLUCOSE, CAPILLARY
GLUCOSE-CAPILLARY: 93 mg/dL (ref 65–99)
GLUCOSE-CAPILLARY: 100 mg/dL — AB (ref 65–99)
GLUCOSE-CAPILLARY: 81 mg/dL (ref 65–99)
Glucose-Capillary: 111 mg/dL — ABNORMAL HIGH (ref 65–99)
Glucose-Capillary: 95 mg/dL (ref 65–99)
Glucose-Capillary: 118 mg/dL — ABNORMAL HIGH (ref 65–99)

## 2017-12-26 MED ORDER — CLONIDINE HCL 0.1 MG PO TABS
0.2000 mg | ORAL_TABLET | Freq: Two times a day (BID) | ORAL | Status: DC
  Administered 2017-12-26 – 2018-01-03 (×15): 0.2 mg via ORAL
  Filled 2017-12-26 (×16): qty 2

## 2017-12-26 MED ORDER — NEPRO/CARBSTEADY PO LIQD
237.0000 mL | Freq: Two times a day (BID) | ORAL | Status: DC
  Administered 2017-12-26 – 2018-01-05 (×8): 237 mL via ORAL
  Filled 2017-12-26 (×26): qty 237

## 2017-12-26 NOTE — Progress Notes (Addendum)
[facility] Progress Note   Subjective:   Seen in room, no c/o, no sob, cough , no swelling. 3L off w HD yest, no BP drops, no tachycarida.   Objective Vitals:   12/25/17 2219 12/26/17 0239 12/26/17 0612 12/26/17 0934  BP: (!) 175/85 (!) 161/77 (!) 158/74 140/64  Pulse: 90 76 72 79  Resp: 16 16 16 20   Temp: 99.1 F (37.3 C) 98.1 F (36.7 C) 98.1 F (36.7 C) 98.3 F (36.8 C)  TempSrc: Oral Oral Oral Oral  SpO2: 95% 97% 96% 98%  Weight:      Height:       Physical Exam General: NAD, sitting in dialysis chair, L side weakness/facial droop Heart: RRR Lungs: mostly CTAB, bibasilar rales Abdomen:soft, NTND Extremities:no edema Dialysis Access: LU AVF cannulated   Filed Weights   12/24/17 1202 12/25/17 1130 12/25/17 1548  Weight: 57 kg (125 lb 10.6 oz) 57 kg (125 lb 10.6 oz) 54 kg (119 lb 0.8 oz)    Intake/Output Summary (Last 24 hours) at 12/26/2017 1230 Last data filed at 12/26/2017 0934 Gross per 24 hour  Intake 600 ml  Output 3375 ml  Net -2775 ml    Additional Objective Labs: Basic Metabolic Panel: Recent Labs  Lab 12/20/17 1109 12/24/17 0500 12/25/17 1156  NA 129* 130* 129*  K 4.5 4.3 4.3  CL 94* 91* 89*  CO2 22 23 26   GLUCOSE 111* 91 100*  BUN 34* 45* 21*  CREATININE 6.14* 7.63* 5.48*  CALCIUM 8.9 8.4* 9.0  PHOS 4.5 5.4* 5.2*   Liver Function Tests: Recent Labs  Lab 12/20/17 1109 12/25/17 1156  ALBUMIN 3.2* 3.6   CBC: Recent Labs  Lab 12/20/17 1109 12/24/17 0500 12/25/17 1157  WBC 6.3 7.4 5.7  HGB 10.0* 10.6* 11.5*  HCT 30.9* 32.6* 35.8*  MCV 91.2 90.3 92.5  PLT 298 323 325    CBG: Recent Labs  Lab 12/25/17 0619 12/25/17 2216 12/26/17 0608 12/26/17 0821 12/26/17 1150  GLUCAP 105* 111* 93 95 100*    Studies/Results: No results found.  Medications: . ferric gluconate (FERRLECIT/NULECIT) IV Stopped (12/25/17 1527)   .  stroke: mapping our early stages of recovery book   Does not apply Once  . amLODipine  10  mg Oral QHS  . atorvastatin  10 mg Oral q1800  . calcitRIOL  0.75 mcg Oral Once per day on Tue Thu Sat  . cloNIDine  0.1 mg Oral BID  . darbepoetin (ARANESP) injection - DIALYSIS  200 mcg Intravenous Q Tue-HD  . feeding supplement (NEPRO CARB STEADY)  237 mL Oral BID BM  . hydrALAZINE  50 mg Oral Q8H  . insulin aspart  0-9 Units Subcutaneous TID WC  . multivitamin  1 tablet Oral QHS  . pantoprazole  40 mg Oral QHS  . polyethylene glycol  17 g Oral Daily  . senna-docusate  1 tablet Oral BID  . sevelamer carbonate  800 mg Oral TID WC    Dialysis Orders: TTS at [facility] 4hr 65kg 2/2bath AVF Hep 2000 (NO heparin x 4 wks from bleed/ CVA, 4 wks = Feb 6) - Mircera Feb 8 IV q 2 weeks (last 1/3) - Calcitriol 0.26mcg PO q HD   Assessment: 1.R Thalamic Hemorrhagic Stroke (w/L sided deficits) - per Neuro 2. ESRD- HD TTS.  Per neuro wait 4 wks (Feb 6th) before resuming heparin w HD, wait longer if possible 3. Anemiaof CKD-last Hgb 10.6. On IV iron and 72m Aranesp qHD (Tues) Monitor. 4. MBD  of CKD- Ca 8.4, P 5.4. Continue VDRA and binders. 5.HTN/volume- on norvasc/ hydral/ clonidine, BP's up, 14kg under prior dry wt 6. Nutrition- Renal diet, rena-vite, Alb 3.2, Nepro TID.  7. Type 2 DM - per primary 8. Dispo - to SNF, per primary. 9 NONADHERENCE TO HD, MEDs   P - HD TTS, cont lower vol   Vinson Moselle MD [facility] pager [phone]   12/26/2017, 12:30 PM

## 2017-12-26 NOTE — Progress Notes (Signed)
NEUROHOSPITALISTS - STROKE TEAM DAILY PROGRESS NOTE   SUBJECTIVE Family at bedside this morning. Voices no new complaints. Continues to have occasional movement of Left side. Very encouraged by this movement of the Left side. HD in AM. Patient has been accepted by SNF/outpt HD, plan for discharge on Tuesday  Physical Examination: Vitals:   12/25/17 2219 12/26/17 0239 12/26/17 0612 12/26/17 0934  BP: (!) 175/85 (!) 161/77 (!) 158/74 140/64  Pulse: 90 76 72 79  Resp: _0 Temp: 99.1 F (37.3 C) 98.1 F (36.7 C) 98.1 F (36.7 C) 98.3 F (36.8 C)  TempSrc: Oral Oral Oral Oral  SpO2: 95% 97% 96% 98%  Weight:      Height:       HEENT-  Normocephalic,  Cardiovascular - Regular rate and rhythm, + murmur Respiratory - Lungs clear bilaterally. Non-labored breathing, No wheezing. Abdomen - soft and non-tender, BS normal Extremities- no edema or cyanosis Skin-warm and dry  Neurological Examination Mental Status: Awake,oriented x 3  Speech is slightly dysarthric. Able to follow 3 step commands without difficulty. Cranial Nerves: II: Left field cut. Pupils are equal, round, and reactive to light.   III,IV, VI: EOMI without ptosis or diploplia. No nystagmus.   V: Facial sensation is decreased on the Left VII: Facial movement is decreased on the Left VIII: hearing is intact to voice X: Uvula elevates symmetrically XI: Shoulder shrug is decreased on the left XII: tongue is midline without atrophy or fasciculations.  Motor: Tone is normal. Bulk is normal. 5/5 strength on the Right.  Dense left hemiplegia and Strength is 1/5 on the Left leg and 0-1/5 in left  arm   Sensor: Sensation is decreased on the Left in the arms and legs. Normal on the Right Deep Tendon Reflexes: 1+ and symmetric throughout in the biceps and patellae Plantars: Toes are downgoing bilaterally.  Cerebellar: unable to perform finger-to-nose on Left Gait: Truncal ataxia to Left   Lab  Results: CBC: Recent Labs  Lab 12/20/17 1109 12/24/17 0500 12/25/17 1157  WBC 6.3 7.4 5.7  HGB 10.0* 10.6* 11.5*  HCT 30.9* 32.6* 35.8*  MCV 91.2 90.3 92.5  PLT 298 323 878   Basic Metabolic Panel: Recent Labs  Lab 12/20/17 1109 12/24/17 0500 12/25/17 1156  NA 129* 130* 129*  K 4.5 4.3 4.3  CL 94* 91* 89*  CO2 _1 GLUCOSE 111* 91 100*  BUN 34* 45* 21*  CREATININE 6.14* 7.63* 5.48*  CALCIUM 8.9 8.4* 9.0  MG  --  1.7  --   PHOS 4.5 5.4* 5.2*   Liver Function Tests: Recent Labs  Lab 12/20/17 1109 12/25/17 1156  ALBUMIN 3.2* 3.6   Imaging:  Ct Head Code Stroke Wo Contrast Result Date: 12/07/2017 IMPRESSION:  1. Acute right thalamic hemorrhage. Mild edema and local mass effect.  2. 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  3. New age indeterminate left thalamic lacunar infarct.   Ct Angio Head W Or Wo Contrast Result Date: 12/07/2017 IMPRESSION: 1. No aneurysm, vascular malformation, or spot sign associated with the right thalamic hemorrhage. 2. Patent circle of Willis with intracranial atherosclerosis as above.  Moderate to severe right PCA stenoses.   Ct Head Wo Contrast Result Date: 12/08/2017 IMPRESSION:  1. Stable right thalamic hematoma and small volume of hemorrhage in right lateral ventricle.  2. Mild increase of mass effect with 5 mm right-to-left midline shift, previously 4 mm.  3. No new  acute intracranial abnormality identified.   ECHO  12/12/2017 Study Conclusions - Left ventricle: The cavity size was normal. There was moderate   concentric hypertrophy. Systolic function was normal. The   estimated ejection fraction was in the range of 55% to 60%. Wall   motion was normal; there were no regional wall motion   abnormalities. There was a reduced contribution of atrial   contraction to ventricular filling, due to increased ventricular   diastolic pressure or atrial contractile dysfunction.  Doppler   parameters are consistent with a reversible restrictive pattern,   indicative of decreased left ventricular diastolic compliance   and/or increased left atrial pressure (grade 3 diastolic   dysfunction). Doppler parameters are consistent with high   ventricular filling pressure. - Aortic valve: Trileaflet; mildly thickened, mildly calcified   leaflets. Moderate focal calcification involving the left   coronary cusp. - Aorta: Ascending aorta diameter: 40 mm (ED). - Ascending aorta: The ascending aorta was mildly dilated. - Mitral valve: Calcified annulus. There was mild regurgitation. - Left atrium: The atrium was mildly dilated. - Right ventricle: The cavity size was mildly dilated. Wall   thickness was normal. - Tricuspid valve: There was trivial regurgitation  Carotid Duplex           12/11/2017 Final Interpretation: Right Carotid:  The extracranial vessels were near-normal with only minimal wall thickening or plaque. Left Carotid: The extracranial vessels were near-normal with only minimal wall thickening or plaque. Vertebrals: Left vertebral artery was patent with antegrade flow.  Right vertebral artery was not visualized. Subclavians: Normal flow hemodynamics were seen in the right subclavian artery.  Left subclavian artery exhibits monophasic flow, suggestive of possbile proximal obstruction     IMPRESSION: [name] is a 52 y.o. male with PMH of ESRD on Dialysis, HTN, Hx of CVA without deficits, BPH and Depression that presents to the ED as a code stroke with reports of acute onset Left sided weakness, Left facial droop, decreased sensation on the Left, slightly dysarthric speech. . EMS reports that patient fell out of his bed at approximately 15:40. Patients room mate states that he last saw him normal at 07:00 this morning. CT Head reveals:  Acute right thalamic hemorrhage with mild cytotoxic edema New age indeterminate left thalamic lacunar  infarct  Suspected Etiology: HTN hemorrhage and small vessel disease Resultant Symptoms: Left sided weakness, Left facial droop, decreased sensation on Left Stroke Risk Factors: hypertension, ESRD on Dialysis Other Stroke Risk Factors: Advanced age, ?ETOH use, Hx stroke  Outstanding Stroke Work-up Studies:   Work up completed at this time  PLAN  12/26/2017: Awaiting bed in SNF for discharge - Plan for Tuesday Frequent Neurochecks  Telemetry Monitoring Continue to HOLD Aspirin as patient has a right ICH For worsening mentation, obtain STAT Head CT- and contact Neurology immediately F/U PT/OT/SLP at SNF Ongoing aggressive stroke risk factor management Patient counseled to be compliant with his antithrombotic medications Patient counseled on Lifestyle modifications including, Diet, Exercise, and Stress Follow up with [facility] in 6 weeks  HX OF STROKES:  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  DYSPHAGIA:  Passed SLP swallow evaluation- Dysphagia 3 diet  Aspiration Precautions in progress  MEDICAL ISSUES:  ESRD on Dialysis - HD in AM (Thursday)  Stable, Nephrology following, Appreciate Assistance- HD in AM  HD today  Creatinine - 5.48  Anemia Chronic  Stable, trending up slowly   Iron Studies in progress  Nephrology to start ESA, IV Iron, Aranesp qHD  Repeat labs in AM  Hyperkalemia Stable - HD today - K+4.3 Repeat labs in AM  Hyponatremia  Stable, Nephrology following  Na 129  Hypocalcemia - Secondary Hyperparathyroidism  Stable, Nephrology following   9.0  Elevated Alk Phos  Stable, Nephrology following  12/17/2017 - 253  Elevated Phosphorus  Stable, Nephrology following 5.2  Leg Spasms  Zanaflex BID PRN  Nausea Zofran PRN Will continue to monitor closely  Diarrhea- Resolved  Formed BM 12/22/2017  Imodium PRN  HYPERTENSION:  Stable, some elevated B/P's noted overnight  SBP goal less than 140/90 most of the  time  Clonidine dose increased to 0.2 mg BID  Labetolol PRN  Long term BP goal normotensive.  Patient currently on: Norvasc, Hydralazine  HYPERLIPIDEMIA  Home Meds:  NONE  LDL  goal < 70, LDL 73  Continue Lipitor 10 mg  Continue statin at discharge  DIABETES:  HgbA1c goal < 7.0, HgbA1C = 4.0  Currently on:  Novolog  Continue CBG monitoring and SSI to maintain glucose 140-180 mg/dl  DM education   Other Active Problems: Active Problems:   IVH (intraventricular hemorrhage) (HCC)   Dysphagia, post-stroke   Type 2 diabetes mellitus with peripheral neuropathy (HCC)   History of tobacco abuse   Benign essential HTN   ESRD on dialysis ([place])   History of traumatic brain injury   History of CVA with residual deficit   History of transmetatarsal amputation of foot ([place])   ICH (intracerebral hemorrhage) ([place])    Hospital day # 19 VTE prophylaxis: SCD's  Diet - Fall precautions Diet renal/carb modified with fluid restriction Diet-HS Snack? Nothing; Fluid restriction: 1200 mL Fluid; Room service appropriate? Yes; Fluid consistency: Thin   FAMILY UPDATES: No family at bedside  TEAM UPDATES:Sethi, Lucy [place], MD   Prior Home Stroke Medications:  No antithrombotic  Discharge Stroke Meds:  Please discharge patient on TBD , will likely add ASA prior to discharge  Disposition: [facility]   Therapy Recs:   12/26/2017, Discussion with MSW,                                 Discharge to [facility] on Tuesday.   Follow Up:  Follow-up Information    Garvin Fila, MD. Schedule an appointment as soon as possible for a visit in 6 week(s).   Specialties:  Neurology, Radiology Contact information: [address] [place] [address] [place], [place], MD -PCP Follow up in 1-2 weeks      Assessment & plan discussed with with attending physician and they are in agreement   Renie Ora Neurology Stroke Team 12/26/2017 12:48  PM  Please page stroke NP  Or  PA  Or MD from 8am -4 pm  as this patient from this time will be  followed by the stroke.   You can look them up on [email or web address]  Password [email or web address]

## 2017-12-26 NOTE — Progress Notes (Signed)
Physical Therapy Treatment Patient Details Name: DEMPSY Burns MRN: [id number] DOB: 10/22/1966 Today's Date: 12/26/2017    History of Present Illness 52 y.o. male with ESRD, Type 2 DM, HTN, GERD, and Hx of Right basal ganglia CVA (2017), right transmet amputation. Admitted with right thalamic hemorrhagic stroke.    PT Comments    Pt continues to progress towards his goals as he is getting more ability to move his L side. Pt still requires modA for bed mobility, modAx2 for transfers and maxAx2 for ambulation of 2 feet with platform walker. Discharge plan remains appropriate for improving functional mobility for safe return to his home environment. PT will continue to follow acutely until d/c.     Follow Up Recommendations  Supervision/Assistance - 24 hour;SNF     Equipment Recommendations  Other (comment)(TBD)    Recommendations for Other Services       Precautions / Restrictions Precautions Precautions: Fall Restrictions Weight Bearing Restrictions: No    Mobility  Bed Mobility Overal bed mobility: Needs Assistance Bed Mobility: Supine to Sit     Supine to sit: Mod assist     General bed mobility comments: ModA for movement of LE off bed, and trunk yo upright  Transfers Overall transfer level: Needs assistance Equipment used: Left platform walker   Sit to Stand: Mod assist;+2 physical assistance         General transfer comment: modAx2 for sit to stand to L platform walker, requires assist for balance and placement of L arm into platform   Ambulation/Gait Ambulation/Gait assistance: Max assist;+2 physical assistance Ambulation Distance (Feet): 2 Feet Assistive device: Left platform walker Gait Pattern/deviations: Step-to pattern;Decreased step length - left;Decreased weight shift to left;Shuffle;Trunk flexed Gait velocity: slowed Gait velocity interpretation: Below normal speed for age/gender General Gait Details: max Ax2 for weightshifting and manual  advancement of L LE for three point turn to transfer to recliner     Balance Overall balance assessment: Needs assistance Sitting-balance support: Single extremity supported;Feet supported Sitting balance-Leahy Scale: Fair Sitting balance - Comments: Pt with increased ability to sit EoB without constant assist   Standing balance support: Bilateral upper extremity supported Standing balance-Leahy Scale: Zero                              Cognition Arousal/Alertness: Awake/alert Behavior During Therapy: WFL for tasks assessed/performed Overall Cognitive Status: Impaired/Different from baseline Area of Impairment: Following commands;Safety/judgement;Awareness;Problem solving;Attention                   Current Attention Level: Divided   Following Commands: Follows multi-step commands consistently   Awareness: Anticipatory Problem Solving: Slow processing;Decreased initiation;Difficulty sequencing;Requires verbal cues;Requires tactile cues General Comments: greater focus today and able to follow multistep commands pretty consistently             Pertinent Vitals/Pain Pain Assessment: No/denies pain           PT Goals (current goals can now be found in the care plan section) Acute Rehab PT Goals Patient Stated Goal: return home, read PT Goal Formulation: With patient Time For Goal Achievement: 12/22/17 Potential to Achieve Goals: Good Progress towards PT goals: Progressing toward goals    Frequency    Min 3X/week      PT Plan Current plan remains appropriate       AM-PAC PT "6 Clicks" Daily Activity  Outcome Measure  Difficulty turning over in bed (including adjusting bedclothes, sheets and  blankets)?: Unable Difficulty moving from lying on back to sitting on the side of the bed? : Unable Difficulty sitting down on and standing up from a chair with arms (e.g., wheelchair, bedside commode, etc,.)?: Unable Help needed moving to and from a bed  to chair (including a wheelchair)?: Total Help needed walking in hospital room?: Total Help needed climbing 3-5 steps with a railing? : Total 6 Click Score: 6    End of Session Equipment Utilized During Treatment: Gait belt Activity Tolerance: Patient tolerated treatment well Patient left: with call bell/phone within reach;with nursing/sitter in room;in chair;with chair alarm set Nurse Communication: Mobility status PT Visit Diagnosis: Unsteadiness on feet (R26.81);Other abnormalities of gait and mobility (R26.89);Hemiplegia and hemiparesis Hemiplegia - Right/Left: Left Hemiplegia - dominant/non-dominant: Non-dominant Hemiplegia - caused by: Other Nontraumatic intracranial hemorrhage     Time: 1130-1149 PT Time Calculation (min) (ACUTE ONLY): 19 min  Charges:  $Therapeutic Activity: 8-22 mins                    G Codes:       Wesley Pita B. Wesley Burns PT, DPT Acute Rehabilitation  [phone] Pager [phone]     Wesley Burns [facility] 12/26/2017, 4:49 PM

## 2017-12-26 NOTE — Progress Notes (Signed)
CSW following for discharge plan. CSW contacted Amy at [facility] [phone], [phone] to confirm that patient has been medically approved for dialysis in [place]. CSW received information on additional medical requests that the dialysis center needs: updated chest x-ray, run sheets from hemodialysis, and a pending Medicaid number.   CSW alerted MD of need for chest x-ray. CSW contacted [facility] with HD to discuss needing run sheets; HD sessions are documented in flow sheets and can't be easily printed and sent. CSW copied and pasted information to print and send to [facility]. CSW contacted Vickie at [employer] [phone]) to check on Medicaid pending number. Per Vickie at [employer], when financial counseling submitted the patient's Medicaid application, they did not mention that he was needing facility placement; Vickie will need to check with her supervisor because it should have been submitted differently. Vickie will call back with updates.  CSW will continue to follow.  Blenda Nicely, [place] Clinical Social Worker [phone]

## 2017-12-26 NOTE — Care Management Note (Signed)
Case Management Note  Patient Details  Name: Wesley Burns MRN: [id number] Date of Birth: 05/09/1966  Subjective/Objective:                    Action/Plan: Continue to await CLIP process in [place]. Patient has a bed at [facility]. CM following.  Expected Discharge Date:                  Expected Discharge Plan:  Skilled Nursing Facility  In-House Referral:  Clinical Social Work  Discharge planning Services  CM Consult  Post Acute Care Choice:    Choice offered to:     DME Arranged:    DME Agency:     HH Arranged:    HH Agency:     Status of Service:  In process, will continue to follow  If discussed at Long Length of Stay Meetings, dates discussed:    Additional Comments:  Kermit Balo, RN 12/26/2017, 11:34 AM

## 2017-12-27 ENCOUNTER — Inpatient Hospital Stay (HOSPITAL_COMMUNITY): Payer: Self-pay

## 2017-12-27 LAB — GLUCOSE, CAPILLARY
GLUCOSE-CAPILLARY: 91 mg/dL (ref 65–99)
Glucose-Capillary: 106 mg/dL — ABNORMAL HIGH (ref 65–99)
Glucose-Capillary: 128 mg/dL — ABNORMAL HIGH (ref 65–99)

## 2017-12-27 LAB — CBC
HCT: 33.5 % — ABNORMAL LOW (ref 39.0–52.0)
Hemoglobin: 10.4 g/dL — ABNORMAL LOW (ref 13.0–17.0)
MCH: 29.2 pg (ref 26.0–34.0)
MCHC: 31 g/dL (ref 30.0–36.0)
MCV: 94.1 fL (ref 78.0–100.0)
PLATELETS: 296 10*3/uL (ref 150–400)
RBC: 3.56 MIL/uL — AB (ref 4.22–5.81)
RDW: 18.7 % — AB (ref 11.5–15.5)
WBC: 5.9 10*3/uL (ref 4.0–10.5)

## 2017-12-27 LAB — BASIC METABOLIC PANEL
Anion gap: 15 (ref 5–15)
BUN: 28 mg/dL — AB (ref 6–20)
CALCIUM: 9.1 mg/dL (ref 8.9–10.3)
CO2: 25 mmol/L (ref 22–32)
Chloride: 92 mmol/L — ABNORMAL LOW (ref 101–111)
Creatinine, Ser: 5.48 mg/dL — ABNORMAL HIGH (ref 0.61–1.24)
GFR calc Af Amer: 12 mL/min — ABNORMAL LOW (ref >60)
GFR, EST NON AFRICAN AMERICAN: 11 mL/min — AB (ref >60)
GLUCOSE: 120 mg/dL — AB (ref 65–99)
POTASSIUM: 3.8 mmol/L (ref 3.5–5.1)
SODIUM: 132 mmol/L — AB (ref 135–145)

## 2017-12-27 MED ORDER — HYDRALAZINE HCL 50 MG PO TABS
75.0000 mg | ORAL_TABLET | Freq: Three times a day (TID) | ORAL | Status: DC
  Administered 2017-12-27 – 2018-01-03 (×19): 75 mg via ORAL
  Filled 2017-12-27 (×19): qty 1

## 2017-12-27 MED ORDER — CALCITRIOL 0.5 MCG PO CAPS
ORAL_CAPSULE | ORAL | Status: AC
  Filled 2017-12-27: qty 1

## 2017-12-27 MED ORDER — CALCITRIOL 0.25 MCG PO CAPS
ORAL_CAPSULE | ORAL | Status: AC
  Filled 2017-12-27: qty 1

## 2017-12-27 NOTE — Progress Notes (Signed)
Patient went to dialysis.

## 2017-12-27 NOTE — Progress Notes (Signed)
Patient returned to room from HD. VSS. Pt denied pain/distress. Will continue to monitor.   Hav, RN

## 2017-12-27 NOTE — Progress Notes (Signed)
[facility] Progress Note   Subjective:  On HD, no c/o  Objective Vitals:   12/27/17 0915 12/27/17 0945 12/27/17 1015 12/27/17 1045  BP: (!) 147/99 139/81 106/74 136/80  Pulse: 73 69 65 69  Resp: 16 16 16 15   Temp:      TempSrc:      SpO2:      Weight:      Height:       Physical Exam General: NAD, sitting in dialysis chair, L side weakness/facial droop Heart: RRR Lungs: mostly CTAB, bibasilar rales Abdomen:soft, NTND Extremities:no edema Dialysis Access: LU AVF cannulated   Filed Weights   12/25/17 1130 12/25/17 1548 12/27/17 0710  Weight: 57 kg (125 lb 10.6 oz) 54 kg (119 lb 0.8 oz) 58 kg (127 lb 13.9 oz)    Intake/Output Summary (Last 24 hours) at 12/27/2017 1149 Last data filed at 12/26/2017 1800 Gross per 24 hour  Intake -  Output 160 ml  Net -160 ml    Additional Objective Labs: Basic Metabolic Panel: Recent Labs  Lab 12/24/17 0500 12/25/17 1156 12/27/17 0500  NA 130* 129* 132*  K 4.3 4.3 3.8  CL 91* 89* 92*  CO2 23 26 25   GLUCOSE 91 100* 120*  BUN 45* 21* 28*  CREATININE 7.63* 5.48* 5.48*  CALCIUM 8.4* 9.0 9.1  PHOS 5.4* 5.2*  --    Liver Function Tests: Recent Labs  Lab 12/25/17 1156  ALBUMIN 3.6   CBC: Recent Labs  Lab 12/24/17 0500 12/25/17 1157 12/27/17 0500  WBC 7.4 5.7 5.9  HGB 10.6* 11.5* 10.4*  HCT 32.6* 35.8* 33.5*  MCV 90.3 92.5 94.1  PLT 323 325 296    CBG: Recent Labs  Lab 12/26/17 0821 12/26/17 1150 12/26/17 1636 12/26/17 2138 12/27/17 0611  GLUCAP 95 100* 81 118* 106*    Studies/Results: Ct Head Wo Contrast  Result Date: 12/27/2017 CLINICAL DATA:  Followup intracranial hemorrhage. EXAM: CT HEAD WITHOUT CONTRAST TECHNIQUE: Contiguous axial images were obtained from the base of the skull through the vertex without intravenous contrast. COMPARISON:  12/08/2017 FINDINGS: Brain: Recent intraparenchymal hematoma in the right thalamus is now isodense to edematous brain, with only small flecks of  residual hyperdensity. No evidence of additional bleeding. There does continue to be edema/swelling in the right thalamus. Old hemorrhagic infarction in the right basal ganglia with atrophy and encephalomalacia in that region. Intraventricular blood previously seen has resolved. Ventricular size is stable. No new brain insult. No extra-axial collection. Vascular: There is atherosclerotic calcification of the major vessels at the base of the brain. Skull: Normal Sinuses/Orbits: Clear sinuses. Mastoid effusions. No orbital abnormality. Other: None IMPRESSION: Recent right thalamic intraparenchymal hematoma is now isodense to edematous brain. No evidence of additional bleeding. Old hemorrhagic infarction in the right basal ganglia. Resolution of intraventricular blood.  Ventricular size stable. Electronically Signed   By: 12/29/2017 M.D.   On: 12/27/2017 06:57   Dg Chest Port 1 View  Result Date: 12/26/2017 CLINICAL DATA:  Shortness of breath.  CVA. EXAM: PORTABLE CHEST 1 VIEW COMPARISON:  11/02/2016 and prior exams FINDINGS: The cardiomediastinal silhouette is unremarkable. There is no evidence of focal airspace disease, pulmonary edema, suspicious pulmonary nodule/mass, pleural effusion, or pneumothorax. No acute bony abnormalities are identified. IMPRESSION: No active disease. Electronically Signed   By: 12/28/2017 M.D.   On: 12/26/2017 14:47    Medications: . ferric gluconate (FERRLECIT/NULECIT) IV Stopped (12/25/17 1527)   .  stroke: mapping our early stages of recovery  book   Does not apply Once  . amLODipine  10 mg Oral QHS  . atorvastatin  10 mg Oral q1800  . calcitRIOL  0.75 mcg Oral Once per day on Tue Thu Sat  . cloNIDine  0.2 mg Oral BID  . darbepoetin (ARANESP) injection - DIALYSIS  200 mcg Intravenous Q Tue-HD  . feeding supplement (NEPRO CARB STEADY)  237 mL Oral BID BM  . hydrALAZINE  75 mg Oral Q8H  . insulin aspart  0-9 Units Subcutaneous TID WC  . multivitamin  1 tablet Oral QHS   . pantoprazole  40 mg Oral QHS  . polyethylene glycol  17 g Oral Daily  . senna-docusate  1 tablet Oral BID  . 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  Per neuro wait 4 wks (Feb 6th) before resuming heparin w HD, wait longer if possible 3  Anemiaof CKD-last Hgb 10.6. 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 Continue VDRA and binders. 5 HTN/volume- on norvasc/ hydral/ clon, BP's ok, sig wt loss this admit 6  Nutrition- Renal diet, rena-vite, Alb 3.2, Nepro TID.  7  Type 2 DM - per primary 8  Dispo - to SNF, per primary. 9  NONADHERENCE TO HD, MEDs   P - HD TTS, awaiting SNF placement   Vinson Moselle MD [facility] pager [phone]   12/27/2017, 11:49 AM

## 2017-12-27 NOTE — Progress Notes (Signed)
NEUROHOSPITALISTS - STROKE TEAM DAILY PROGRESS NOTE   SUBJECTIVE Friend is at bedside this pm. Had HD in AM. No acute event overnight. Patient has been accepted by SNF/outpt HD, plan for discharge on Tuesday.  Physical Examination: Vitals:   12/26/17 1717 12/26/17 2135 12/27/17 0153 12/27/17 0510  BP: (!) 156/81 (!) 161/72 (!) 146/67 (!) 143/67  Pulse: 84 85 71 68  Resp: 20 20 18 16   Temp: 98.3 F (36.8 C) 100.3 F (37.9 C) 98.2 F (36.8 C) 99 F (37.2 C)  TempSrc: Oral Oral Oral Oral  SpO2: 98% 98% 98% 98%  Weight:      Height:       HEENT-  Normocephalic,  Cardiovascular - Regular rate and rhythm, + murmur Respiratory - Lungs clear bilaterally. Non-labored breathing, No wheezing. Abdomen - soft and non-tender, BS normal Extremities- no edema or cyanosis Skin-warm and dry  Neurological Examination Mental Status: Awake,oriented x 3  Speech is slightly dysarthric. Able to follow 3 step commands without difficulty. Cranial Nerves: II: Left field cut. Pupils are equal, round, and reactive to light.   III,IV, VI: EOMI without ptosis or diploplia. No nystagmus.   V: Facial sensation is decreased on the Left VII: Facial movement is decreased on the Left VIII: hearing is intact to voice X: Uvula elevates symmetrically XI: Shoulder shrug is decreased on the left XII: tongue is midline without atrophy or fasciculations.  Motor: Tone is normal. Bulk is normal. 5/5 strength on the Right.  Dense left hemiplegia and Strength is 1/5 on the Left leg and 0-1/5 in left  arm   Sensor: Sensation is decreased on the Left in the arms and legs. Normal on the Right Deep Tendon Reflexes: 1+ and symmetric throughout in the biceps and patellae Plantars: Toes are downgoing bilaterally.  Cerebellar: unable to perform finger-to-nose on Left Gait: Truncal ataxia to Left   Lab Results: CBC: Recent Labs  Lab 12/20/17 1109 12/24/17 0500 12/25/17 1157  WBC 6.3 7.4 5.7  HGB  10.0* 10.6* 11.5*  HCT 30.9* 32.6* 35.8*  MCV 91.2 90.3 92.5  PLT 298 323 485   Basic Metabolic Panel: Recent Labs  Lab 12/20/17 1109 12/24/17 0500 12/25/17 1156  NA 129* 130* 129*  K 4.5 4.3 4.3  CL 94* 91* 89*  CO2 22 23 26   GLUCOSE 111* 91 100*  BUN 34* 45* 21*  CREATININE 6.14* 7.63* 5.48*  CALCIUM 8.9 8.4* 9.0  MG  --  1.7  --   PHOS 4.5 5.4* 5.2*   Liver Function Tests: Recent Labs  Lab 12/20/17 1109 12/25/17 1156  ALBUMIN 3.2* 3.6   Imaging:  Ct Head Code Stroke Wo Contrast Result Date: 12/07/2017 IMPRESSION:  1. Acute right thalamic hemorrhage. Mild edema and local mass effect.  2. 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  3. New age indeterminate left thalamic lacunar infarct.   Ct Head Wo Contrast 12/27/2017  IMPRESSION: Recent right thalamic intraparenchymal hematoma is now isodense to edematous brain.  No evidence of additional bleeding. Old hemorrhagic infarction in the right basal ganglia. Resolution of intraventricular blood.  Ventricular size stable.  DG Chest Port 1 View 12/26/2017 IMPRESSION: No active disease.   Ct Angio Head W Or Wo Contrast Result Date: 12/07/2017 IMPRESSION: 1. No aneurysm, vascular malformation, or spot sign associated with the right thalamic hemorrhage. 2. Patent circle of Willis with intracranial atherosclerosis as above.  Moderate to severe right PCA stenoses.   Ct Head Wo Contrast  Result Date: 12/08/2017 IMPRESSION:  1. Stable right thalamic hematoma and small volume of hemorrhage in right lateral ventricle.  2. Mild increase of mass effect with 5 mm right-to-left midline shift, previously 4 mm.  3. No new acute intracranial abnormality identified.   ECHO  12/12/2017 Study Conclusions - Left ventricle: The cavity size was normal. There was moderate   concentric hypertrophy. Systolic function was normal. The   estimated ejection fraction was in the range of 55% to  60%. Wall   motion was normal; there were no regional wall motion   abnormalities. There was a reduced contribution of atrial   contraction to ventricular filling, due to increased ventricular   diastolic pressure or atrial contractile dysfunction. Doppler   parameters are consistent with a reversible restrictive pattern,   indicative of decreased left ventricular diastolic compliance   and/or increased left atrial pressure (grade 3 diastolic   dysfunction). Doppler parameters are consistent with high   ventricular filling pressure. - Aortic valve: Trileaflet; mildly thickened, mildly calcified   leaflets. Moderate focal calcification involving the left   coronary cusp. - Aorta: Ascending aorta diameter: 40 mm (ED). - Ascending aorta: The ascending aorta was mildly dilated. - Mitral valve: Calcified annulus. There was mild regurgitation. - Left atrium: The atrium was mildly dilated. - Right ventricle: The cavity size was mildly dilated. Wall   thickness was normal. - Tricuspid valve: There was trivial regurgitation  Carotid Duplex           12/11/2017 Final Interpretation: Right Carotid:  The extracranial vessels were near-normal with only minimal wall thickening or plaque. Left Carotid: The extracranial vessels were near-normal with only minimal wall thickening or plaque. Vertebrals: Left vertebral artery was patent with antegrade flow.  Right vertebral artery was not visualized. Subclavians: Normal flow hemodynamics were seen in the right subclavian artery.  Left subclavian artery exhibits monophasic flow, suggestive of possbile proximal obstruction     IMPRESSION: [name] is a 52 y.o. male with PMH of ESRD on Dialysis, HTN, Hx of CVA without deficits, BPH and Depression that presents to the ED as a code stroke with reports of acute onset Left sided weakness, Left facial droop, decreased sensation on the Left, slightly dysarthric speech. . EMS reports that patient  fell out of his bed at approximately 15:40. Patients room mate states that he last saw him normal at 07:00 this morning. CT Head reveals:  Acute right thalamic hemorrhage with mild cytotoxic edema New age indeterminate left thalamic lacunar infarct  Suspected Etiology: HTN hemorrhage and small vessel disease Resultant Symptoms: Left sided weakness, Left facial droop, decreased sensation on Left Stroke Risk Factors: hypertension, ESRD on Dialysis Other Stroke Risk Factors: Advanced age, ?ETOH use, Hx stroke  Outstanding Stroke Work-up Studies:   Work up completed at this time  PLAN  12/27/2017: Awaiting bed in SNF for discharge - Plan for Tuesday Frequent Neurochecks  Telemetry Monitoring Continue to HOLD Aspirin as patient has a right ICH For worsening mentation, obtain STAT Head CT- and contact Neurology immediately F/U PT/OT/SLP at SNF Ongoing aggressive stroke risk factor management Patient counseled to be compliant with his antithrombotic medications Patient counseled on Lifestyle modifications including, Diet, Exercise, and Stress Follow up with [facility] in 6 weeks  HX OF STROKES:  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  DYSPHAGIA:  Passed SLP swallow evaluation- Dysphagia 3 diet  Aspiration Precautions in progress  MEDICAL ISSUES:  ESRD on Dialysis - HD in AM (Thursday)  Stable, Nephrology following, Appreciate Assistance- HD in AM  HD today  Creatinine - 5.48  Anemia Chronic  Stable, trending up slowly   Iron Studies in progress  Nephrology to start ESA, IV Iron, Aranesp qHD  Repeat labs in AM  Hyperkalemia Stable - HD today - K+4.3 Repeat labs in AM  Hyponatremia  Stable, Nephrology following  Na 129  Hypocalcemia - Secondary Hyperparathyroidism  Stable, Nephrology following   9.0  Elevated Alk Phos  Stable, Nephrology following  12/17/2017 - 253  Elevated Phosphorus  Stable, Nephrology following 5.2  Leg  Spasms  Zanaflex BID PRN  Nausea Zofran PRN Will continue to monitor closely  Diarrhea- Resolved  Formed BM 12/22/2017  Imodium PRN  HYPERTENSION:  Stable, some elevated B/P's noted overnight  SBP goal less than 140/90 most of the time  Clonidine dose increased to 0.2 mg BID  Labetolol PRN  Long term BP goal normotensive.  Patient currently on: Norvasc, Hydralazine  HYPERLIPIDEMIA  Home Meds:  NONE  LDL  goal < 70, LDL 73  Continue Lipitor 10 mg  Continue statin at discharge  DIABETES:  HgbA1c goal < 7.0, HgbA1C = 4.0  Currently on:  Novolog  Continue CBG monitoring and SSI to maintain glucose 140-180 mg/dl  DM education   Other Active Problems: Active Problems:   IVH (intraventricular hemorrhage) (HCC)   Dysphagia, post-stroke   Type 2 diabetes mellitus with peripheral neuropathy (HCC)   History of tobacco abuse   Benign essential HTN   ESRD on dialysis ([place])   History of traumatic brain injury   History of CVA with residual deficit   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 Nothing; Fluid restriction: 1200 mL Fluid; Room service appropriate? Yes; Fluid consistency: Thin   FAMILY UPDATES: No family at bedside  TEAM UPDATES:Sethi, Lucy [place], MD   Prior Home Stroke Medications:  No antithrombotic  Discharge Stroke Meds:  Please discharge patient on TBD , will likely add ASA prior to discharge  Disposition: [facility]   Therapy Recs:   12/26/2017, Discussion with MSW,                                 Discharge to [facility] on Tuesday.   Follow Up:  Follow-up Information    Garvin Fila, MD. Schedule an appointment as soon as possible for a visit in 6 week(s).   Specialties:  Neurology, Radiology Contact information: [place] [address] [phone]          Lujean Amel, MD -PCP Follow up in 1-2 weeks       ATTENDING NOTE: I reviewed above note and agree with the assessment and plan. I have made any additions or clarifications directly to the above note. Pt was seen and examined.   No acute event overnight.  Had HD today, still pending for SNF.  Plan on Tuesday after HD.  Rosalin Hawking, MD PhD Stroke Neurology 12/27/2017 6:35 PM   Please page stroke NP  Or  PA  Or MD from 8am -4 pm  as this patient from this time will be  followed by the stroke.   You can look them up on [email or web address]  Password [email or web address]

## 2017-12-28 LAB — GLUCOSE, CAPILLARY
GLUCOSE-CAPILLARY: 109 mg/dL — AB (ref 65–99)
GLUCOSE-CAPILLARY: 159 mg/dL — AB (ref 65–99)
GLUCOSE-CAPILLARY: 106 mg/dL — AB (ref 65–99)
GLUCOSE-CAPILLARY: 103 mg/dL — AB (ref 65–99)
Glucose-Capillary: 82 mg/dL (ref 65–99)

## 2017-12-28 LAB — BASIC METABOLIC PANEL
ANION GAP: 12 (ref 5–15)
BUN: 16 mg/dL (ref 6–20)
CHLORIDE: 94 mmol/L — AB (ref 101–111)
CO2: 27 mmol/L (ref 22–32)
Calcium: 9.3 mg/dL (ref 8.9–10.3)
Creatinine, Ser: 3.81 mg/dL — ABNORMAL HIGH (ref 0.61–1.24)
GFR calc Af Amer: 19 mL/min — ABNORMAL LOW (ref >60)
GFR, EST NON AFRICAN AMERICAN: 17 mL/min — AB (ref >60)
GLUCOSE: 130 mg/dL — AB (ref 65–99)
POTASSIUM: 3.7 mmol/L (ref 3.5–5.1)
Sodium: 133 mmol/L — ABNORMAL LOW (ref 135–145)

## 2017-12-28 LAB — CBC
HEMATOCRIT: 35.5 % — AB (ref 39.0–52.0)
Hemoglobin: 11 g/dL — ABNORMAL LOW (ref 13.0–17.0)
MCH: 29.6 pg (ref 26.0–34.0)
MCHC: 31 g/dL (ref 30.0–36.0)
MCV: 95.4 fL (ref 78.0–100.0)
PLATELETS: 291 10*3/uL (ref 150–400)
RBC: 3.72 MIL/uL — ABNORMAL LOW (ref 4.22–5.81)
RDW: 19.1 % — ABNORMAL HIGH (ref 11.5–15.5)
WBC: 5.4 10*3/uL (ref 4.0–10.5)

## 2017-12-28 MED ORDER — DARBEPOETIN ALFA 200 MCG/0.4ML IJ SOSY
200.0000 ug | PREFILLED_SYRINGE | INTRAMUSCULAR | Status: DC
  Administered 2018-01-01: 200 ug via INTRAVENOUS
  Filled 2017-12-28: qty 0.4

## 2017-12-28 NOTE — Progress Notes (Signed)
[facility] Progress Note   Subjective:  No c/o  Objective Vitals:   12/28/17 0108 12/28/17 0705 12/28/17 0910 12/28/17 1108  BP: (!) 147/67 (!) 127/57 (!) 117/59 (!) 146/77  Pulse: 68 70 75   Resp: 16  18   Temp: 98.7 F (37.1 C) 98.1 F (36.7 C) 98.1 F (36.7 C)   TempSrc: Oral Oral Oral   SpO2: 99% 99% 98%   Weight:      Height:       Physical Exam General: NAD, up in bed, L side weakness/facial droop Heart: RRR Lungs: mostly clear bilat Abdomen:soft, NTND Extremities:no edema Dialysis Access: LU AVF cannulated   Filed Weights   12/25/17 1548 12/27/17 0710 12/27/17 1115  Weight: 54 kg (119 lb 0.8 oz) 58 kg (127 lb 13.9 oz) 56.1 kg (123 lb 10.9 oz)    Intake/Output Summary (Last 24 hours) at 12/28/2017 1328 Last data filed at 12/28/2017 0851 Gross per 24 hour  Intake 700 ml  Output -  Net 700 ml    Additional Objective Labs: Basic Metabolic Panel: Recent Labs  Lab 12/24/17 0500 12/25/17 1156 12/27/17 0500 12/28/17 0230  NA 130* 129* 132* 133*  K 4.3 4.3 3.8 3.7  CL 91* 89* 92* 94*  CO2 23 26 25 27   GLUCOSE 91 100* 120* 130*  BUN 45* 21* 28* 16  CREATININE 7.63* 5.48* 5.48* 3.81*  CALCIUM 8.4* 9.0 9.1 9.3  PHOS 5.4* 5.2*  --   --    Liver Function Tests: Recent Labs  Lab 12/25/17 1156  ALBUMIN 3.6   CBC: Recent Labs  Lab 12/24/17 0500 12/25/17 1157 12/27/17 0500 12/28/17 0230  WBC 7.4 5.7 5.9 5.4  HGB 10.6* 11.5* 10.4* 11.0*  HCT 32.6* 35.8* 33.5* 35.5*  MCV 90.3 92.5 94.1 95.4  PLT 323 325 296 291    CBG: Recent Labs  Lab 12/27/17 0611 12/27/17 1658 12/27/17 2236 12/28/17 0702 12/28/17 1042  GLUCAP 106* 91 128* 109* 82    Studies/Results: Ct Head Wo Contrast  Result Date: 12/27/2017 CLINICAL DATA:  Followup intracranial hemorrhage. EXAM: CT HEAD WITHOUT CONTRAST TECHNIQUE: Contiguous axial images were obtained from the base of the skull through the vertex without intravenous contrast. COMPARISON:   12/08/2017 FINDINGS: Brain: Recent intraparenchymal hematoma in the right thalamus is now isodense to edematous brain, with only small flecks of residual hyperdensity. No evidence of additional bleeding. There does continue to be edema/swelling in the right thalamus. Old hemorrhagic infarction in the right basal ganglia with atrophy and encephalomalacia in that region. Intraventricular blood previously seen has resolved. Ventricular size is stable. No new brain insult. No extra-axial collection. Vascular: There is atherosclerotic calcification of the major vessels at the base of the brain. Skull: Normal Sinuses/Orbits: Clear sinuses. Mastoid effusions. No orbital abnormality. Other: None IMPRESSION: Recent right thalamic intraparenchymal hematoma is now isodense to edematous brain. No evidence of additional bleeding. Old hemorrhagic infarction in the right basal ganglia. Resolution of intraventricular blood.  Ventricular size stable. Electronically Signed   By: 02/05/2018 M.D.   On: 12/27/2017 06:57   Dg Chest Port 1 View  Result Date: 12/26/2017 CLINICAL DATA:  Shortness of breath.  CVA. EXAM: PORTABLE CHEST 1 VIEW COMPARISON:  11/02/2016 and prior exams FINDINGS: The cardiomediastinal silhouette is unremarkable. There is no evidence of focal airspace disease, pulmonary edema, suspicious pulmonary nodule/mass, pleural effusion, or pneumothorax. No acute bony abnormalities are identified. IMPRESSION: No active disease. Electronically Signed   By: 14/01/2016.D.  On: 12/26/2017 14:47    Medications: . ferric gluconate (FERRLECIT/NULECIT) IV Stopped (12/27/17 1430)   .  stroke: mapping our early stages of recovery book   Does not apply Once  . amLODipine  10 mg Oral QHS  . atorvastatin  10 mg Oral q1800  . calcitRIOL  0.75 mcg Oral Once per day on Tue Thu Sat  . cloNIDine  0.2 mg Oral BID  . [START ON 01/01/2018] darbepoetin (ARANESP) injection - DIALYSIS  200 mcg Intravenous Q Thu-HD  . feeding  supplement (NEPRO CARB STEADY)  237 mL Oral BID BM  . hydrALAZINE  75 mg Oral Q8H  . insulin aspart  0-9 Units Subcutaneous TID WC  . multivitamin  1 tablet Oral QHS  . pantoprazole  40 mg Oral QHS  . polyethylene glycol  17 g Oral Daily  . senna-docusate  1 tablet Oral BID  . 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  Per neuro wait 4 wks (Feb 6th) before resuming heparin w HD, wait longer if possible 3  Anemiaof CKD-last Hgb 10.6. 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 Continue VDRA and binders. 5 HTN/volume- on norvasc/ hydral/ clon, BP's good, 9 kg under prior dry wt  6  Nutrition- Renal diet, rena-vite, Alb 3.2, Nepro TID.  7  Type 2 DM - per primary 8  Dispo - to SNF, per primary. 9  NONADHERENCE TO HD, MEDs   P - HD TTS, awaiting SNF placement   Vinson Moselle MD [facility] pager [phone]   12/28/2017, 1:28 PM

## 2017-12-28 NOTE — Progress Notes (Signed)
NEUROHOSPITALISTS - STROKE TEAM DAILY PROGRESS NOTE   SUBJECTIVE Friend is at bedside this pm. Pt is sitting in bed for lunch. No acute event overnight. Patient has been accepted by SNF/outpt HD, plan for discharge on Tuesday after HD.  Physical Examination: Vitals:   12/28/17 0705 12/28/17 0910 12/28/17 1108 12/28/17 1339  BP: (!) 127/57 (!) 117/59 (!) 146/77 (!) 118/57  Pulse: 70 75  70  Resp:  18  18  Temp: 98.1 F (36.7 C) 98.1 F (36.7 C)  99.5 F (37.5 C)  TempSrc: Oral Oral  Oral  SpO2: 99% 98%  98%  Weight:      Height:       HEENT-  Normocephalic,  Cardiovascular - Regular rate and rhythm, + murmur Respiratory - Lungs clear bilaterally. Non-labored breathing, No wheezing. Abdomen - soft and non-tender, BS normal Extremities- no edema or cyanosis Skin-warm and dry  Neurological Examination Mental Status: Awake,oriented x 3  Speech is slightly dysarthric. Able to follow 3 step commands without difficulty. Cranial Nerves: II: Left field cut. Pupils are equal, round, and reactive to light.   III,IV, VI: EOMI without ptosis or diploplia. No nystagmus.   V: Facial sensation is decreased on the Left VII: Facial movement is decreased on the Left VIII: hearing is intact to voice X: Uvula elevates symmetrically XI: Shoulder shrug is decreased on the left XII: tongue is midline without atrophy or fasciculations.  Motor: Tone is normal. Bulk is normal. 5/5 strength on the Right.  Dense left hemiplegia and Strength is 1/5 on the Left leg and 0-1/5 in left  arm   Sensor: Sensation is decreased on the Left in the arms and legs. Normal on the Right Deep Tendon Reflexes: 1+ and symmetric throughout in the biceps and patellae Plantars: Toes are downgoing bilaterally.  Cerebellar: unable to perform finger-to-nose on Left Gait: Truncal ataxia to Left   Lab Results: CBC: Recent Labs  Lab 12/25/17 1157 12/27/17 0500 12/28/17 0230  WBC 5.7 5.9 5.4  HGB  11.5* 10.4* 11.0*  HCT 35.8* 33.5* 35.5*  MCV 92.5 94.1 95.4  PLT 325 296 501   Basic Metabolic Panel: Recent Labs  Lab 12/24/17 0500 12/25/17 1156 12/27/17 0500 12/28/17 0230  NA 130* 129* 132* 133*  K 4.3 4.3 3.8 3.7  CL 91* 89* 92* 94*  CO2 23 26 25 27   GLUCOSE 91 100* 120* 130*  BUN 45* 21* 28* 16  CREATININE 7.63* 5.48* 5.48* 3.81*  CALCIUM 8.4* 9.0 9.1 9.3  MG 1.7  --   --   --   PHOS 5.4* 5.2*  --   --    Liver Function Tests: Recent Labs  Lab 12/25/17 1156  ALBUMIN 3.6   Imaging:  Ct Head Code Stroke Wo Contrast Result Date: 12/07/2017 IMPRESSION:  1. Acute right thalamic hemorrhage. Mild edema and local mass effect.  2. 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  3. New age indeterminate left thalamic lacunar infarct.   Ct Head Wo Contrast 12/27/2017  IMPRESSION: Recent right thalamic intraparenchymal hematoma is now isodense to edematous brain.  No evidence of additional bleeding. Old hemorrhagic infarction in the right basal ganglia. Resolution of intraventricular blood.  Ventricular size stable.  DG Chest Port 1 View 12/26/2017 IMPRESSION: No active disease.   Ct Angio Head W Or Wo Contrast Result Date: 12/07/2017 IMPRESSION: 1. No aneurysm, vascular malformation, or spot sign associated with the right thalamic hemorrhage. 2. Patent circle of Willis with  intracranial atherosclerosis as above.  Moderate to severe right PCA stenoses.   Ct Head Wo Contrast Result Date: 12/08/2017 IMPRESSION:  1. Stable right thalamic hematoma and small volume of hemorrhage in right lateral ventricle.  2. Mild increase of mass effect with 5 mm right-to-left midline shift, previously 4 mm.  3. No new acute intracranial abnormality identified.   ECHO  12/12/2017 Study Conclusions - Left ventricle: The cavity size was normal. There was moderate   concentric hypertrophy. Systolic function was normal. The   estimated  ejection fraction was in the range of 55% to 60%. Wall   motion was normal; there were no regional wall motion   abnormalities. There was a reduced contribution of atrial   contraction to ventricular filling, due to increased ventricular   diastolic pressure or atrial contractile dysfunction. Doppler   parameters are consistent with a reversible restrictive pattern,   indicative of decreased left ventricular diastolic compliance   and/or increased left atrial pressure (grade 3 diastolic   dysfunction). Doppler parameters are consistent with high   ventricular filling pressure. - Aortic valve: Trileaflet; mildly thickened, mildly calcified   leaflets. Moderate focal calcification involving the left   coronary cusp. - Aorta: Ascending aorta diameter: 40 mm (ED). - Ascending aorta: The ascending aorta was mildly dilated. - Mitral valve: Calcified annulus. There was mild regurgitation. - Left atrium: The atrium was mildly dilated. - Right ventricle: The cavity size was mildly dilated. Wall   thickness was normal. - Tricuspid valve: There was trivial regurgitation  Carotid Duplex           12/11/2017 Final Interpretation: Right Carotid:  The extracranial vessels were near-normal with only minimal wall thickening or plaque. Left Carotid: The extracranial vessels were near-normal with only minimal wall thickening or plaque. Vertebrals: Left vertebral artery was patent with antegrade flow.  Right vertebral artery was not visualized. Subclavians: Normal flow hemodynamics were seen in the right subclavian artery.  Left subclavian artery exhibits monophasic flow, suggestive of possbile proximal obstruction     IMPRESSION: Mr. ZAYDE STROUPE is a 52 y.o. male with PMH of ESRD on Dialysis, HTN, Hx of CVA without deficits, BPH and Depression that presents to the ED as a code stroke with reports of acute onset Left sided weakness, Left facial droop, decreased sensation on the Left, slightly  dysarthric speech. . EMS reports that patient fell out of his bed at approximately 15:40. Patients room mate states that he last saw him normal at 07:00 this morning. CT Head reveals:  Acute right thalamic hemorrhage with mild cytotoxic edema New age indeterminate left thalamic lacunar infarct  Suspected Etiology: HTN hemorrhage and small vessel disease Resultant Symptoms: Left sided weakness, Left facial droop, decreased sensation on Left Stroke Risk Factors: hypertension, ESRD on Dialysis Other Stroke Risk Factors: Advanced age, ?ETOH use, Hx stroke  Outstanding Stroke Work-up Studies:   Work up completed at this time  PLAN  12/28/2017: Awaiting bed in SNF for discharge - Plan for Tuesday Frequent Neurochecks  Telemetry Monitoring Continue to HOLD Aspirin as patient has a right ICH For worsening mentation, obtain STAT Head CT- and contact Neurology immediately F/U PT/OT/SLP at SNF Ongoing aggressive stroke risk factor management Patient counseled to be compliant with his antithrombotic medications Patient counseled on Lifestyle modifications including, Diet, Exercise, and Stress Follow up with [facility] in 6 weeks  HX OF STROKES:  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  DYSPHAGIA:  Passed SLP swallow evaluation- Dysphagia 3 diet  Aspiration Precautions in progress  MEDICAL ISSUES:  ESRD on Dialysis - HD in AM (Thursday)  Stable, Nephrology following, Appreciate Assistance- HD in AM  HD today  Creatinine - 5.48 -> 3.81  Anemia Chronic  Stable, trending up slowly   Iron Studies in progress  Nephrology to start ESA, IV Iron, Aranesp qHD  Repeat labs in AM  Hyperkalemia Stable - HD  - K+4.3 -> 3.7 Repeat labs in AM  Hyponatremia  Stable, Nephrology following  Na 129 -> 133  Hypocalcemia - Secondary Hyperparathyroidism  Stable, Nephrology following   9.0 -> 9.3  Elevated Alk Phos  Stable, Nephrology following  12/17/2017 -  253  Elevated Phosphorus  Stable, Nephrology following 5.2 -> 5.4  Leg Spasms  Zanaflex BID PRN  Nausea Zofran PRN Will continue to monitor closely  Diarrhea- Resolved  Formed BM 12/22/2017  Imodium PRN  HYPERTENSION:  Stable, some elevated B/P's noted overnight  SBP goal less than 140/90 most of the time  Clonidine dose increased to 0.2 mg BID  Labetolol PRN  Long term BP goal normotensive.  Patient currently on: Norvasc, Hydralazine  HYPERLIPIDEMIA  Home Meds:  NONE  LDL  goal < 70, LDL 73  Continue Lipitor 10 mg  Continue statin at discharge  DIABETES:  HgbA1c goal < 7.0, HgbA1C = 4.0  Currently on:  Novolog  Continue CBG monitoring and SSI to maintain glucose 140-180 mg/dl  DM education   Other Active Problems: Active Problems:   IVH (intraventricular hemorrhage) (HCC)   Dysphagia, post-stroke   Type 2 diabetes mellitus with peripheral neuropathy (HCC)   History of tobacco abuse   Benign essential HTN   ESRD on dialysis ([place])   History of traumatic brain injury   History of CVA with residual deficit   History of transmetatarsal amputation of foot ([place])   ICH (intracerebral hemorrhage) ([place])    Hospital day # 21 VTE prophylaxis: SCD's  Diet - Fall precautions Diet renal/carb modified with fluid restriction Diet-HS Snack? Nothing; Fluid restriction: 1200 mL Fluid; Room service appropriate? Yes; Fluid consistency: Thin   FAMILY UPDATES: No family at bedside  TEAM UPDATES:Sherrelle Prochazka, MD   Prior Home Stroke Medications:  No antithrombotic  Discharge Stroke Meds:  Please discharge patient on TBD , will likely add ASA prior to discharge  Disposition: [facility]   Therapy Recs:   12/26/2017, Discussion with MSW,                                 [facility]charge to [facility] on Tuesday.   Follow Up:  Follow-up Information    Garvin Fila, MD. Schedule an appointment as soon as possible for a visit in 6 week(s).   Specialties:  Neurology,  Radiology Contact information: [place] [phone]          Lujean Amel, MD -PCP Follow up in 1-2 weeks       ATTENDING NOTE: I reviewed above note and agree with the assessment and plan. I have made any additions or clarifications directly to the above note. Pt was seen and examined.   No acute event overnight.  Still pending for SNF.  Plan on Tuesday after HD.  Rosalin Hawking, MD PhD Stroke Neurology 12/28/2017 5:37 PM   Please page stroke NP  Or  PA  Or MD from 8am -4 pm  as this patient from this time will be  followed by the stroke.   You can look them up on [email or web address]  Password [email or web address]

## 2017-12-29 LAB — BASIC METABOLIC PANEL
Anion gap: 16 — ABNORMAL HIGH (ref 5–15)
BUN: 32 mg/dL — AB (ref 6–20)
CHLORIDE: 92 mmol/L — AB (ref 101–111)
CO2: 23 mmol/L (ref 22–32)
Calcium: 8.8 mg/dL — ABNORMAL LOW (ref 8.9–10.3)
Creatinine, Ser: 5.67 mg/dL — ABNORMAL HIGH (ref 0.61–1.24)
GFR calc Af Amer: 12 mL/min — ABNORMAL LOW (ref >60)
GFR calc non Af Amer: 10 mL/min — ABNORMAL LOW (ref >60)
Glucose, Bld: 92 mg/dL (ref 65–99)
POTASSIUM: 3.9 mmol/L (ref 3.5–5.1)
Sodium: 131 mmol/L — ABNORMAL LOW (ref 135–145)

## 2017-12-29 LAB — GLUCOSE, CAPILLARY
GLUCOSE-CAPILLARY: 138 mg/dL — AB (ref 65–99)
GLUCOSE-CAPILLARY: 116 mg/dL — AB (ref 65–99)
Glucose-Capillary: 105 mg/dL — ABNORMAL HIGH (ref 65–99)
Glucose-Capillary: 95 mg/dL (ref 65–99)
Glucose-Capillary: 110 mg/dL — ABNORMAL HIGH (ref 65–99)

## 2017-12-29 LAB — CBC
HEMATOCRIT: 35.3 % — AB (ref 39.0–52.0)
HEMOGLOBIN: 11 g/dL — AB (ref 13.0–17.0)
MCH: 29.6 pg (ref 26.0–34.0)
MCHC: 31.2 g/dL (ref 30.0–36.0)
MCV: 94.9 fL (ref 78.0–100.0)
Platelets: 318 10*3/uL (ref 150–400)
RBC: 3.72 MIL/uL — AB (ref 4.22–5.81)
RDW: 19.1 % — ABNORMAL HIGH (ref 11.5–15.5)
WBC: 6 10*3/uL (ref 4.0–10.5)

## 2017-12-29 MED ORDER — ASPIRIN EC 81 MG PO TBEC: 81.0000 mg | DELAYED_RELEASE_TABLET | Freq: Every day | ORAL | Status: DC

## 2017-12-29 MED ORDER — CALCIUM CARBONATE ANTACID 500 MG PO CHEW
1.0000 | CHEWABLE_TABLET | Freq: Every day | ORAL | Status: DC
  Administered 2017-12-30 – 2018-01-10 (×11): 200 mg via ORAL
  Filled 2017-12-29 (×15): qty 1

## 2017-12-29 MED ORDER — ASPIRIN EC 81 MG PO TBEC
81.0000 mg | DELAYED_RELEASE_TABLET | Freq: Every day | ORAL | Status: DC
  Administered 2017-12-30 – 2018-01-10 (×10): 81 mg via ORAL
  Filled 2017-12-29 (×13): qty 1

## 2017-12-29 NOTE — Plan of Care (Signed)
  Progressing Education: Knowledge of disease or condition will improve 12/29/2017 0956 - Progressing by Quentin Cornwall, RN Knowledge of secondary prevention will improve 12/29/2017 0956 - Progressing by Quentin Cornwall, RN Knowledge of patient specific risk factors addressed and post discharge goals established will improve 12/29/2017 0956 - Progressing by Quentin Cornwall, RN Coping: Will verbalize positive feelings about self 12/29/2017 0956 - Progressing by Quentin Cornwall, RN Will identify appropriate support needs 12/29/2017 0956 - Progressing by Quentin Cornwall, RN Health Behavior/Discharge Planning: Ability to manage health-related needs will improve 12/29/2017 0956 - Progressing by Quentin Cornwall, RN Self-Care: Ability to participate in self-care as condition permits will improve 12/29/2017 0956 - Progressing by Quentin Cornwall, RN Verbalization of feelings and concerns over difficulty with self-care will improve 12/29/2017 0956 - Progressing by Quentin Cornwall, RN Ability to communicate needs accurately will improve 12/29/2017 0956 - Progressing by Quentin Cornwall, RN Nutrition: Risk of aspiration will decrease 12/29/2017 0956 - Progressing by Quentin Cornwall, RN Dietary intake will improve 12/29/2017 0956 - Progressing by Quentin Cornwall, RN Intracerebral Hemorrhage Tissue Perfusion: Complications of Intracerebral Hemorrhage will be minimized 12/29/2017 0956 - Progressing by Quentin Cornwall, RN Education: Knowledge of General Education information will improve 12/29/2017 [phone] - Progressing by Quentin Cornwall, RN

## 2017-12-29 NOTE — Progress Notes (Signed)
[name] Progress Note   Subjective:   Eating dinner.  Comfortable.  Hopeful to discharge tomorrow after dialysis.  No c/os.   Objective Vitals:   12/29/17 0142 12/29/17 0500 12/29/17 1054 12/29/17 1352  BP: 139/64 (!) 142/68 (!) 107/53 (!) 110/55  Pulse: 62 68 (!) 58 (!) 58  Resp: 16 16 16 18   Temp: 98.3 F (36.8 C) 98.4 F (36.9 C) 98 F (36.7 C) 98 F (36.7 C)  TempSrc: Oral Oral Oral Oral  SpO2: 98% 98% 98% 97%  Weight:      Height:       Physical Exam General: WNWD sitting up in bed. L side facial droop Heart: RRR Lungs: CTAB Abdomen: soft ND Extremities: no edema  Dialysis Access: L AVF +bruit    Additional Objective Labs: Basic Metabolic Panel: Recent Labs  Lab 12/24/17 0500 12/25/17 1156 12/27/17 0500 12/28/17 0230 12/29/17 0502  NA 130* 129* 132* 133* 131*  K 4.3 4.3 3.8 3.7 3.9  CL 91* 89* 92* 94* 92*  CO2 23 26 25 27 23   GLUCOSE 91 100* 120* 130* 92  BUN 45* 21* 28* 16 32*  CREATININE 7.63* 5.48* 5.48* 3.81* 5.67*  CALCIUM 8.4* 9.0 9.1 9.3 8.8*  PHOS 5.4* 5.2*  --   --   --    CBC: Recent Labs  Lab 12/24/17 0500 12/25/17 1157 12/27/17 0500 12/28/17 0230 12/29/17 0502  WBC 7.4 5.7 5.9 5.4 6.0  HGB 10.6* 11.5* 10.4* 11.0* 11.0*  HCT 32.6* 35.8* 33.5* 35.5* 35.3*  MCV 90.3 92.5 94.1 95.4 94.9  PLT 323 325 296 291 318    Recent Labs  Lab 12/28/17 1804 12/28/17 2149 12/29/17 0609 12/29/17 1133 12/29/17 1625  GLUCAP 106* 103* 105* 116* 95    Lab Results  Component Value Date   INR 0.93 12/07/2017   INR 0.94 11/02/2016   INR 1.00 04/28/2015   Medications: . ferric gluconate (FERRLECIT/NULECIT) IV Stopped (12/27/17 1430)   .  stroke: mapping our early stages of recovery book   Does not apply Once  . amLODipine  10 mg Oral QHS  . atorvastatin  10 mg Oral q1800  . calcitRIOL  0.75 mcg Oral Once per day on Tue Thu Sat  . cloNIDine  0.2 mg Oral BID  . [START ON 01/01/2018] darbepoetin (ARANESP) injection -  DIALYSIS  200 mcg Intravenous Q Thu-HD  . feeding supplement (NEPRO CARB STEADY)  237 mL Oral BID BM  . hydrALAZINE  75 mg Oral Q8H  . insulin aspart  0-9 Units Subcutaneous TID WC  . multivitamin  1 tablet Oral QHS  . pantoprazole  40 mg Oral QHS  . polyethylene glycol  17 g Oral Daily  . senna-docusate  1 tablet Oral BID  . sevelamer carbonate  800 mg Oral TID WC   Dialysis Orders:  TTS at [facility] 4hr  65kg  2/2bath  AVF  Hep 2000 (NO heparin x 4 wks from bleed/ CVA, 4 wks = Feb 6) - Mircera Feb 8 IV q 2 weeks (last 1/3) - Calcitriol 0.1mcg PO q HD  Assessment/Plan: 1 R Thalamic Hemorrhagic Stroke (w/L sided deficits; no left sided movement other than thumb on left hand) - per Neuro 2  ESRD-HD TTS.  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 Continue VDRA and binders. 5 HTN/volume- on  norvasc/ hydral/ clonidine, BP's good, 9 kg under prior dry wt  6  Nutrition- Renal diet, rena-vite,Alb 3.2, Nepro TID.  7  Type 2 DM - per primary 8  Dispo - to SNF ? tomorrow after dialysis  9  NONADHERENCE TO HD, MEDs  Wesley Susann Givens PA-C [facility] Pager [phone] 12/29/2017,4:50 PM  LOS: 22 days   I have seen and examined this patient and agree with plan and assessment in the above note with renal recommendations/intervention highlighted. Anticipate discharge to SNF tomorrow after HD. I am told that there are arrangements for dialysis at [facility] unit in [place]. Will confirm in AM and make sure paperwork FAXED.    Wesley Burns B,MD 12/29/2017 6:12 PM

## 2017-12-29 NOTE — Progress Notes (Signed)
Physical Therapy Treatment Patient Details Name: Wesley Burns MRN: [id number] DOB: 02/02/1966 Today's Date: 12/29/2017    History of Present Illness 52 y.o. male with ESRD, Type 2 DM, HTN, GERD, and Hx of Right basal ganglia CVA (2017), right transmet amputation. Admitted with right thalamic hemorrhagic stroke.    PT Comments    Pt received in bed and agreeable to therapy. He is very motivated and cooperative. Pt transferred bed to recliner with +2 mod assist toward right side. Goals reviewed and updated.   Follow Up Recommendations  Supervision/Assistance - 24 hour;SNF     Equipment Recommendations  Other (comment)(TBD)    Recommendations for Other Services       Precautions / Restrictions Precautions Precautions: Fall;Other (comment) Precaution Comments: left foot ulcer    Mobility  Bed Mobility         Supine to sit: Mod assist     General bed mobility comments: +rail, use of bed pad to scoot to EOB  Transfers   Equipment used: None     Stand pivot transfers: +2 physical assistance;Mod assist       General transfer comment: pivot transfer bed to recliner toward right  Ambulation/Gait                 Stairs            Wheelchair Mobility    Modified Rankin (Stroke Patients Only) Modified Rankin (Stroke Patients Only) Pre-Morbid Rankin Score: No symptoms Modified Rankin: Severe disability     Balance   Sitting-balance support: Single extremity supported;Feet supported Sitting balance-Leahy Scale: Fair     Standing balance support: Bilateral upper extremity supported Standing balance-Leahy Scale: Zero                              Cognition Arousal/Alertness: Awake/alert Behavior During Therapy: WFL for tasks assessed/performed Overall Cognitive Status: Impaired/Different from baseline Area of Impairment: Problem solving                   Current Attention Level: Divided   Following Commands: Follows  multi-step commands consistently Safety/Judgement: Decreased awareness of safety Awareness: Anticipatory Problem Solving: Slow processing;Decreased initiation;Difficulty sequencing;Requires verbal cues;Requires tactile cues        Exercises      General Comments        Pertinent Vitals/Pain Pain Assessment: No/denies pain    Home Living                      Prior Function            PT Goals (current goals can now be found in the care plan section) Acute Rehab PT Goals Patient Stated Goal: return home, read PT Goal Formulation: With patient Time For Goal Achievement: 01/05/18 Potential to Achieve Goals: Good Progress towards PT goals: Progressing toward goals    Frequency    Min 3X/week      PT Plan Current plan remains appropriate    Co-evaluation              AM-PAC PT "6 Clicks" Daily Activity  Outcome Measure  Difficulty turning over in bed (including adjusting bedclothes, sheets and blankets)?: Unable Difficulty moving from lying on back to sitting on the side of the bed? : Unable Difficulty sitting down on and standing up from a chair with arms (e.g., wheelchair, bedside commode, etc,.)?: Unable Help needed moving to and from a bed  to chair (including a wheelchair)?: A Lot Help needed walking in hospital room?: Total Help needed climbing 3-5 steps with a railing? : Total 6 Click Score: 7    End of Session Equipment Utilized During Treatment: Gait belt Activity Tolerance: Patient tolerated treatment well Patient left: in chair;with chair alarm set;with call bell/phone within reach Nurse Communication: Mobility status PT Visit Diagnosis: Unsteadiness on feet (R26.81);Other abnormalities of gait and mobility (R26.89);Hemiplegia and hemiparesis Hemiplegia - Right/Left: Left Hemiplegia - dominant/non-dominant: Non-dominant Hemiplegia - caused by: Other Nontraumatic intracranial hemorrhage     Time: 0942-1008 PT Time Calculation (min)  (ACUTE ONLY): 26 min  Charges:  $Therapeutic Activity: 23-37 mins                    G Codes:       Aida Raider, PT  Office # [phone] Pager [phone]    Ilda Foil 12/29/2017, 10:41 AM

## 2017-12-29 NOTE — Progress Notes (Addendum)
NEUROHOSPITALISTS - STROKE TEAM DAILY PROGRESS NOTE   SUBJECTIVE No family at bedside this pm. Pt is sitting in chair for lunch. No acute event overnight. Patient has been accepted by SNF/outpt HD, plan for discharge on Tuesday after HD. Patient voices no new complaints. Continues to be able to move thumb on Left hand. No other left sided movement noted on exam  Physical Examination: Vitals:   12/29/17 0142 12/29/17 0500 12/29/17 1054 12/29/17 1352  BP: 139/64 (!) 142/68 (!) 107/53 (!) 110/55  Pulse: 62 68 (!) 58 (!) 58  Resp: _0 Temp: 98.3 F (36.8 C) 98.4 F (36.9 C) 98 F (36.7 C) 98 F (36.7 C)  TempSrc: Oral Oral Oral Oral  SpO2: 98% 98% 98% 97%  Weight:      Height:       HEENT-  Normocephalic,  Cardiovascular - Regular rate and rhythm, + murmur Respiratory - Lungs clear bilaterally. Non-labored breathing, No wheezing. Abdomen - soft and non-tender, BS normal Extremities- no edema or cyanosis Skin-warm and dry  Neurological Examination Mental Status: Awake,oriented x 3  Speech is slightly dysarthric. Able to follow 3 step commands without difficulty. Cranial Nerves: II: Left field cut. Pupils are equal, round, and reactive to light.   III,IV, VI: EOMI without ptosis or diploplia. No nystagmus.   V: Facial sensation is decreased on the Left VII: Facial movement is decreased on the Left VIII: hearing is intact to voice X: Uvula elevates symmetrically XI: Shoulder shrug is decreased on the left XII: tongue is midline without atrophy or fasciculations.  Motor: Tone is normal. Bulk is normal. 5/5 strength on the Right.  Dense left hemiplegia and Strength is 1/5 on the Left leg and 0-1/5 in left  arm   Sensor: Sensation is decreased on the Left in the arms and legs. Normal on the Right Deep Tendon Reflexes: 1+ and symmetric throughout in the biceps and patellae Plantars: Toes are downgoing bilaterally.  Cerebellar: unable to perform  finger-to-nose on Left Gait: Truncal ataxia to Left   Lab Results: CBC: Recent Labs  Lab 12/27/17 0500 12/28/17 0230 12/29/17 0502  WBC 5.9 5.4 6.0  HGB 10.4* 11.0* 11.0*  HCT 33.5* 35.5* 35.3*  MCV 94.1 95.4 94.9  PLT 296 291 882   Basic Metabolic Panel: Recent Labs  Lab 12/24/17 0500 12/25/17 1156 12/27/17 0500 12/28/17 0230 12/29/17 0502  NA 130* 129* 132* 133* 131*  K 4.3 4.3 3.8 3.7 3.9  CL 91* 89* 92* 94* 92*  CO2 _1 GLUCOSE 91 100* 120* 130* 92  BUN 45* 21* 28* 16 32*  CREATININE 7.63* 5.48* 5.48* 3.81* 5.67*  CALCIUM 8.4* 9.0 9.1 9.3 8.8*  MG 1.7  --   --   --   --   PHOS 5.4* 5.2*  --   --   --    Liver Function Tests: Recent Labs  Lab 12/25/17 1156  ALBUMIN 3.6   Imaging:  Ct Head Code Stroke Wo Contrast Result Date: 12/07/2017 IMPRESSION:  1. Acute right thalamic hemorrhage. Mild edema and local mass effect.  2. 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  3. New age indeterminate left thalamic lacunar infarct.   Ct Head Wo Contrast 12/27/2017  IMPRESSION: Recent right thalamic intraparenchymal hematoma is now isodense to edematous brain.  No evidence of additional bleeding. Old hemorrhagic infarction in the right basal ganglia. Resolution of intraventricular blood.  Ventricular size stable.  DG Chest Port 1 View 12/26/2017 IMPRESSION: No active disease.  Ct Angio Head W Or Wo Contrast Result Date: 12/07/2017 IMPRESSION: 1. No aneurysm, vascular malformation, or spot sign associated with the right thalamic hemorrhage. 2. Patent circle of Willis with intracranial atherosclerosis as above.  Moderate to severe right PCA stenoses.   Ct Head Wo Contrast Result Date: 12/08/2017 IMPRESSION:  1. Stable right thalamic hematoma and small volume of hemorrhage in right lateral ventricle.  2. Mild increase of mass effect with 5 mm right-to-left midline shift, previously 4 mm.  3. No new acute  intracranial abnormality identified.   ECHO  12/12/2017 Study Conclusions - Left ventricle: The cavity size was normal. There was moderate   concentric hypertrophy. Systolic function was normal. The   estimated ejection fraction was in the range of 55% to 60%. Wall   motion was normal; there were no regional wall motion   abnormalities. There was a reduced contribution of atrial   contraction to ventricular filling, due to increased ventricular   diastolic pressure or atrial contractile dysfunction. Doppler   parameters are consistent with a reversible restrictive pattern,   indicative of decreased left ventricular diastolic compliance   and/or increased left atrial pressure (grade 3 diastolic   dysfunction). Doppler parameters are consistent with high   ventricular filling pressure. - Aortic valve: Trileaflet; mildly thickened, mildly calcified   leaflets. Moderate focal calcification involving the left   coronary cusp. - Aorta: Ascending aorta diameter: 40 mm (ED). - Ascending aorta: The ascending aorta was mildly dilated. - Mitral valve: Calcified annulus. There was mild regurgitation. - Left atrium: The atrium was mildly dilated. - Right ventricle: The cavity size was mildly dilated. Wall   thickness was normal. - Tricuspid valve: There was trivial regurgitation  Carotid Duplex           12/11/2017 Final Interpretation: Right Carotid:  The extracranial vessels were near-normal with only minimal wall thickening or plaque. Left Carotid: The extracranial vessels were near-normal with only minimal wall thickening or plaque. Vertebrals: Left vertebral artery was patent with antegrade flow.  Right vertebral artery was not visualized. Subclavians: Normal flow hemodynamics were seen in the right subclavian artery.  Left subclavian artery exhibits monophasic flow, suggestive of possbile proximal obstruction     IMPRESSION: Wesley Burns is a 52 y.o. male with PMH of ESRD on  Dialysis, HTN, Hx of CVA without deficits, BPH and Depression that presents to the ED as a code stroke with reports of acute onset Left sided weakness, Left facial droop, decreased sensation on the Left, slightly dysarthric speech. . EMS reports that patient fell out of his bed at approximately 15:40. Patients room mate states that he last saw him normal at 07:00 this morning. CT Head reveals:  Acute right thalamic hemorrhage with mild cytotoxic edema New age indeterminate left thalamic lacunar infarct  Suspected Etiology: HTN hemorrhage and small vessel disease Resultant Symptoms: Left sided weakness, Left facial droop, decreased sensation on Left Stroke Risk Factors: hypertension, ESRD on Dialysis Other Stroke Risk Factors: Advanced age, ?ETOH use, Hx stroke  Outstanding Stroke Work-up Studies:   Work up completed at this time  PLAN  12/29/2017: Awaiting bed in SNF for discharge - Plan for Tuesday Frequent Neurochecks  Telemetry Monitoring Continue to HOLD Aspirin as patient has a right ICH For worsening mentation, obtain STAT Head CT- and contact Neurology immediately F/U PT/OT/SLP at SNF Ongoing aggressive stroke risk factor management Patient counseled to be compliant with his  antithrombotic medications Patient counseled on Lifestyle modifications including, Diet, Exercise, and Stress Follow up with [facility] in 6 weeks  HX OF STROKES:  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  DYSPHAGIA:  Passed SLP swallow evaluation- Dysphagia 3 diet  Aspiration Precautions in progress  MEDICAL ISSUES:  ESRD on Dialysis - HD in AM (Tuesday)  Stable, Nephrology following, Appreciate Assistance- HD in AM  HD today  Creatinine - 5.48 -> 5.67  Anemia Chronic  Stable, trending up slowly   Iron Studies in progress  Nephrology to start ESA, IV Iron, Aranesp qHD  Repeat labs in AM  Hyperkalemia Stable - HD  - K+4.3 -> 3.9 Repeat labs in AM  Hyponatremia  Stable,  Nephrology following  Na 129 -> 131  Hypocalcemia - Secondary Hyperparathyroidism  Stable, Nephrology following   9.0 -> 8.8  Elevated Alk Phos  Stable, Nephrology following  12/17/2017 - 253  Elevated Phosphorus  Stable, Nephrology following 5.2 -> 5.4  Leg Spasms  Zanaflex BID PRN  Nausea Zofran PRN Will continue to monitor closely  Diarrhea- Resolved  Imodium PRN  HYPERTENSION:  Stable, some elevated B/P's noted overnight  SBP goal less than 140/90 most of the time  Clonidine dose increased to 0.2 mg BID  Labetolol PRN  Long term BP goal normotensive.  Patient currently on: Norvasc, Hydralazine  HYPERLIPIDEMIA  Home Meds:  NONE  LDL  goal < 70, LDL 73  Continue Lipitor 10 mg  Continue statin at discharge  DIABETES:  HgbA1c goal < 7.0, HgbA1C = 4.0  Currently on:  Novolog  Continue CBG monitoring and SSI to maintain glucose 140-180 mg/dl  DM education   Other Active Problems: Active Problems:   IVH (intraventricular hemorrhage) (HCC)   Dysphagia, post-stroke   Type 2 diabetes mellitus with peripheral neuropathy (HCC)   History of tobacco abuse   Benign essential HTN   ESRD on dialysis ([place])   History of traumatic brain injury   History of CVA with residual deficit   History of transmetatarsal amputation of foot ([facility])   ICH (intracerebral hemorrhage) ([place])    Hospital day # 22 VTE prophylaxis: SCD's  Diet - Fall precautions Diet renal/carb modified with fluid restriction Diet-HS Snack? Nothing; Fluid restriction: 1200 mL Fluid; Room service appropriate? Yes; Fluid consistency: Thin   FAMILY UPDATES: No family at bedside  TEAM UPDATES:Usman Millett, MD   Prior Home Stroke Medications:  No antithrombotic  Discharge Stroke Meds:  Please discharge patient on TBD , will likely add ASA prior to discharge  Disposition: [facility]   Therapy Recs:   12/26/2017, Discussion with MSW,                                 Discharge to [facility] on Tuesday.   Follow Up:  Follow-up Information    Garvin Fila, MD. Schedule an appointment as soon as possible for a visit in 6 week(s).   Specialties:  Neurology, Radiology Contact information: [address] [place] [address] [place], [place], MD -PCP Follow up in 1-2 weeks      Assessment & plan discussed with with attending physician and they are in agreement.    Renie Ora Stroke Neurology Team 12/29/2017 2:07 PM   ATTENDING NOTE: I reviewed above note and agree with the assessment and plan. I have made any  additions or clarifications directly to the above note. Pt was seen and examined.   No acute event overnight. had CT repeat 12/27/17 showed right thalamic ICH has resolved. Will put on baby ASA. Still pending for SNF.  Plan on Tuesday after HD.   Rosalin Hawking, MD PhD Stroke Neurology 12/29/2017 5:58 PM   Please page stroke NP  Or  PA  Or MD from 8am -4 pm  as this patient from this time will be  followed by the stroke.   You can look them up on [email or web address]  Password [email or web address]

## 2017-12-30 ENCOUNTER — Inpatient Hospital Stay (HOSPITAL_COMMUNITY): Payer: Self-pay

## 2017-12-30 DIAGNOSIS — K567 Ileus, unspecified: Secondary | ICD-10-CM

## 2017-12-30 LAB — CBC
HEMATOCRIT: 36.3 % — AB (ref 39.0–52.0)
HEMOGLOBIN: 12 g/dL — AB (ref 13.0–17.0)
MCH: 30.2 pg (ref 26.0–34.0)
MCHC: 33.1 g/dL (ref 30.0–36.0)
MCV: 91.2 fL (ref 78.0–100.0)
Platelets: 313 10*3/uL (ref 150–400)
RBC: 3.98 MIL/uL — ABNORMAL LOW (ref 4.22–5.81)
RDW: 18.6 % — AB (ref 11.5–15.5)
WBC: 9.4 10*3/uL (ref 4.0–10.5)

## 2017-12-30 LAB — GLUCOSE, CAPILLARY
GLUCOSE-CAPILLARY: 126 mg/dL — AB (ref 65–99)
GLUCOSE-CAPILLARY: 116 mg/dL — AB (ref 65–99)
Glucose-Capillary: 99 mg/dL (ref 65–99)

## 2017-12-30 LAB — RENAL FUNCTION PANEL
ALBUMIN: 3.5 g/dL (ref 3.5–5.0)
ANION GAP: 19 — AB (ref 5–15)
BUN: 56 mg/dL — AB (ref 6–20)
CO2: 20 mmol/L — ABNORMAL LOW (ref 22–32)
Calcium: 8.5 mg/dL — ABNORMAL LOW (ref 8.9–10.3)
Chloride: 90 mmol/L — ABNORMAL LOW (ref 101–111)
Creatinine, Ser: 8.02 mg/dL — ABNORMAL HIGH (ref 0.61–1.24)
GFR, EST AFRICAN AMERICAN: 8 mL/min — AB (ref >60)
GFR, EST NON AFRICAN AMERICAN: 7 mL/min — AB (ref >60)
Glucose, Bld: 151 mg/dL — ABNORMAL HIGH (ref 65–99)
PHOSPHORUS: 6 mg/dL — AB (ref 2.5–4.6)
POTASSIUM: 4.4 mmol/L (ref 3.5–5.1)
Sodium: 129 mmol/L — ABNORMAL LOW (ref 135–145)

## 2017-12-30 MED ORDER — LIDOCAINE HCL (PF) 1 % IJ SOLN: 5.0000 mL | INTRAMUSCULAR | Status: DC | PRN

## 2017-12-30 MED ORDER — SODIUM CHLORIDE 0.9 % IV SOLN: 100.0000 mL | INTRAVENOUS | Status: DC | PRN

## 2017-12-30 MED ORDER — ALTEPLASE 2 MG IJ SOLR
2.0000 mg | Freq: Once | INTRAMUSCULAR | Status: DC | PRN
  Filled 2017-12-30: qty 2

## 2017-12-30 MED ORDER — HEPARIN SODIUM (PORCINE) 1000 UNIT/ML DIALYSIS
1000.0000 [IU] | INTRAMUSCULAR | Status: DC | PRN
  Filled 2017-12-30: qty 1

## 2017-12-30 MED ORDER — PENTAFLUOROPROP-TETRAFLUOROETH EX AERO: 1.0000 "application " | INHALATION_SPRAY | CUTANEOUS | Status: DC | PRN

## 2017-12-30 MED ORDER — LIDOCAINE-PRILOCAINE 2.5-2.5 % EX CREA
1.0000 "application " | TOPICAL_CREAM | CUTANEOUS | Status: DC | PRN
  Filled 2017-12-30: qty 5

## 2017-12-30 NOTE — Progress Notes (Signed)
NEUROHOSPITALISTS - STROKE TEAM DAILY PROGRESS NOTE   SUBJECTIVE No family at bedside today. Pt is found laying in bed complaining of abdominal pain. Nurse reports patient had large, soft BM overnight. Patient has been put onto bedpan multiple times since then, he is feeling he still needs to expel stool but cannot. No other acute event reported overnight. Patient was scheduled for discharge today but per MSW he has a new case worker and we do not have his temporary Medicaid number, necessary for the outpatient HD facility.   Physical Examination: Vitals:   12/30/17 1203 12/30/17 1214 12/30/17 1230 12/30/17 1330  BP: (!) 164/81 (!) 153/81 (!) 145/80 (!) 144/74  Pulse: 76 72 68 70  Resp: 18     Temp: 98.9 F (37.2 C)     TempSrc: Oral     SpO2: 97%     Weight:      Height:       HEENT-  Normocephalic,  Cardiovascular - Regular rate and rhythm, + murmur Respiratory - Lungs clear bilaterally. Non-labored breathing, No wheezing. Abdomen - soft and tender, BS normal, complaining of pain with palpation Extremities- no edema or cyanosis Skin-warm and dry  Neurological Examination Mental Status: Awake,oriented x 3  Speech is slightly dysarthric. Able to follow 3 step commands without difficulty. Cranial Nerves: II: Left field cut. Pupils are equal, round, and reactive to light.   III,IV, VI: EOMI without ptosis or diploplia. No nystagmus.   V: Facial sensation is decreased on the Left VII: Facial movement is decreased on the Left VIII: hearing is intact to voice X: Uvula elevates symmetrically XI: Shoulder shrug is decreased on the left XII: tongue is midline without atrophy or fasciculations.  Motor: Tone is normal. Bulk is normal. 5/5 strength on the Right.  Dense left hemiplegia and Strength is 1/5 on the Left leg and 0-1/5 in left  arm   Sensor: Sensation is decreased on the Left in the arms and legs. Normal on the Right Deep Tendon Reflexes: 1+ and symmetric  throughout in the biceps and patellae Plantars: Toes are downgoing bilaterally.  Cerebellar: unable to perform finger-to-nose on Left Gait: Truncal ataxia to Left   Lab Results: CBC: Recent Labs  Lab 12/28/17 0230 12/29/17 0502 12/30/17 1225  WBC 5.4 6.0 9.4  HGB 11.0* 11.0* 12.0*  HCT 35.5* 35.3* 36.3*  MCV 95.4 94.9 91.2  PLT 291 318 124   Basic Metabolic Panel: Recent Labs  Lab 12/24/17 0500 12/25/17 1156 12/27/17 0500 12/28/17 0230 12/29/17 0502 12/30/17 1225  NA 130* 129* 132* 133* 131* 129*  K 4.3 4.3 3.8 3.7 3.9 4.4  CL 91* 89* 92* 94* 92* 90*  CO2 23 26 25 27 23  20*  GLUCOSE 91 100* 120* 130* 92 151*  BUN 45* 21* 28* 16 32* 56*  CREATININE 7.63* 5.48* 5.48* 3.81* 5.67* 8.02*  CALCIUM 8.4* 9.0 9.1 9.3 8.8* 8.5*  MG 1.7  --   --   --   --   --   PHOS 5.4* 5.2*  --   --   --  6.0*   Liver Function Tests: Recent Labs  Lab 12/25/17 1156 12/30/17 1225  ALBUMIN 3.6 3.5   Imaging:  Ct Head Code Stroke Wo Contrast Result Date: 12/07/2017 IMPRESSION:  1. Acute right thalamic hemorrhage. Mild edema and local mass effect.  2. 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  3. New age indeterminate left thalamic lacunar infarct.   Ct Head  Wo Contrast 12/27/2017  IMPRESSION: Recent right thalamic intraparenchymal hematoma is now isodense to edematous brain.  No evidence of additional bleeding. Old hemorrhagic infarction in the right basal ganglia. Resolution of intraventricular blood.  Ventricular size stable.  DG Chest Port 1 View 12/26/2017 IMPRESSION: No active disease.  Ct Angio Head W Or Wo Contrast Result Date: 12/07/2017 IMPRESSION: 1. No aneurysm, vascular malformation, or spot sign associated with the right thalamic hemorrhage. 2. Patent circle of Willis with intracranial atherosclerosis as above.  Moderate to severe right PCA stenoses.   Ct Head Wo Contrast Result Date: 12/08/2017 IMPRESSION:  1.  Stable right thalamic hematoma and small volume of hemorrhage in right lateral ventricle.  2. Mild increase of mass effect with 5 mm right-to-left midline shift, previously 4 mm.  3. No new acute intracranial abnormality identified.   ECHO  12/12/2017 Study Conclusions - Left ventricle: The cavity size was normal. There was moderate   concentric hypertrophy. Systolic function was normal. The   estimated ejection fraction was in the range of 55% to 60%. Wall   motion was normal; there were no regional wall motion   abnormalities. There was a reduced contribution of atrial   contraction to ventricular filling, due to increased ventricular   diastolic pressure or atrial contractile dysfunction. Doppler   parameters are consistent with a reversible restrictive pattern,   indicative of decreased left ventricular diastolic compliance   and/or increased left atrial pressure (grade 3 diastolic   dysfunction). Doppler parameters are consistent with high   ventricular filling pressure. - Aortic valve: Trileaflet; mildly thickened, mildly calcified   leaflets. Moderate focal calcification involving the left   coronary cusp. - Aorta: Ascending aorta diameter: 40 mm (ED). - Ascending aorta: The ascending aorta was mildly dilated. - Mitral valve: Calcified annulus. There was mild regurgitation. - Left atrium: The atrium was mildly dilated. - Right ventricle: The cavity size was mildly dilated. Wall   thickness was normal. - Tricuspid valve: There was trivial regurgitation  Carotid Duplex           12/11/2017 Final Interpretation: Right Carotid:  The extracranial vessels were near-normal with only minimal wall thickening or plaque. Left Carotid: The extracranial vessels were near-normal with only minimal wall thickening or plaque. Vertebrals: Left vertebral artery was patent with antegrade flow.  Right vertebral artery was not visualized. Subclavians: Normal flow hemodynamics were seen in the  right subclavian artery.  Left subclavian artery exhibits monophasic flow, suggestive of possbile proximal obstruction  ABDOMEN - 1 VIEW      12/30/2017 11:34 IMPRESSION: Air-filled loops of small and large bowel noted suggesting adynamic ileus. Follow-up exam suggested to exclude bowel obstruction.     IMPRESSION: [name] is a 52 y.o. male with PMH of ESRD on Dialysis, HTN, Hx of CVA without deficits, BPH and Depression that presents to the ED as a code stroke with reports of acute onset Left sided weakness, Left facial droop, decreased sensation on the Left, slightly dysarthric speech. . EMS reports that patient fell out of his bed at approximately 15:40. Patients room mate states that he last saw him normal at 07:00 this morning. CT Head reveals:  Acute right thalamic hemorrhage with mild cytotoxic edema New age indeterminate left thalamic lacunar infarct  Suspected Etiology: HTN hemorrhage and small vessel disease Resultant Symptoms: Left sided weakness, Left facial droop, decreased sensation on Left Stroke Risk Factors: hypertension, ESRD on Dialysis Other Stroke Risk Factors: Advanced age, ?ETOH use, Hx stroke  Outstanding Stroke Work-up Studies:   Work up completed at this time  PLAN  12/30/2017: Awaiting bed in SNF for discharge - Plan once Bowel Obstruction resolved Frequent Neurochecks  Telemetry Monitoring Continue to HOLD Aspirin as patient has a right ICH For worsening mentation, obtain STAT Head CT- and contact Neurology immediately F/U PT/OT/SLP at SNF Ongoing aggressive stroke risk factor management Patient counseled to be compliant with his antithrombotic medications Patient counseled on Lifestyle modifications including, Diet, Exercise, and Stress Follow up with [facility] in 6 weeks  HX OF STROKES:  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  DYSPHAGIA:  Passed SLP swallow evaluation- Dysphagia 3 diet  Aspiration Precautions in  progress  MEDICAL ISSUES:  Possible Bowel Obstruction   - 12/30/2017 Consulted GI by phone Dr Benson Norway Insert NGT to intermittent suction Turn patient every 2 hrs Serial Plain film XRays NPO for now, except meds and ice chips No IVF's per now, per Nephrology, Dr Lorrene Reid Will continue to monitor closely  ESRD on Dialysis - HD  (Thursday)  Stable, Nephrology following, Appreciate Assistance- HD in AM  HD today  Creatinine - 8.02  Anemia Chronic  Stable, trending up slowly   Iron Studies in progress  Nephrology to start ESA, IV Iron, Aranesp qHD  Repeat labs in AM  Hyperkalemia Stable - HD  - K+4.4 Repeat labs in AM  Hyponatremia  Stable, Nephrology following  Na 129   Hypocalcemia - Secondary Hyperparathyroidism  Stable, Nephrology following   8.5  Elevated Alk Phos  Stable, Nephrology following  12/17/2017 - 253  Elevated Phosphorus  Stable, Nephrology following 6.0  Leg Spasms  Zanaflex BID PRN  Nausea Zofran PRN Will continue to monitor closely  Diarrhea- Resolved  Imodium PRN  HYPERTENSION:  Stable, some elevated B/P's noted overnight  SBP goal less than 140/90 most of the time  Clonidine dose increased to 0.2 mg BID  Labetolol PRN  Long term BP goal normotensive.  Patient currently on: Norvasc, Hydralazine  HYPERLIPIDEMIA  Home Meds:  NONE  LDL  goal < 70, LDL 73  Continue Lipitor 10 mg  Continue statin at discharge  DIABETES:  HgbA1c goal < 7.0, HgbA1C = 4.0  Currently on:  Novolog  Continue CBG monitoring and SSI to maintain glucose 140-180 mg/dl  DM education   Other Active Problems: Active Problems:   IVH (intraventricular hemorrhage) (HCC)   Dysphagia, post-stroke   Type 2 diabetes mellitus with peripheral neuropathy (HCC)   History of tobacco abuse   Benign essential HTN   ESRD on dialysis ([place])   History of traumatic brain injury   History of CVA with residual deficit   History of transmetatarsal  amputation of foot ([place])   ICH (intracerebral hemorrhage) ([place])    Hospital day # 23 VTE prophylaxis: SCD's  Diet - Fall precautions Diet renal/carb modified with fluid restriction Diet-HS Snack? Nothing; Fluid restriction: 1200 mL Fluid; Room service appropriate? Yes; Fluid consistency: Thin   FAMILY UPDATES: No family at bedside  TEAM UPDATES:Ann Groeneveld, MD   Prior Home Stroke Medications:  No antithrombotic  Discharge Stroke Meds:  Please discharge patient on TBD , will likely add ASA prior to discharge  Disposition: [place]:   12/03/2017, Discussion with MSW,                                 Discharge to [facility] on Tuesday.  Follow Up:  Follow-up Information    Garvin Fila, MD. Schedule an appointment as soon as possible for a visit in 6 week(s).   Specialties:  Neurology, Radiology Contact information: [address] [place] [address] [place], [place], MD -PCP Follow up in 1-2 weeks      Assessment & plan discussed with with attending physician and they are in agreement.    Renie Ora Stroke Neurology Team 12/30/2017 2:22 PM   ATTENDING NOTE: I reviewed above note and agree with the assessment and plan. I have made any additions or clarifications directly to the above note. Pt was seen and examined.   Patient had large bowel movement overnight, complaining of abdominal pain this morning.  He will be showed air loops, suggesting adynamic ileus.  GI consult recommended NG tube for suctioning, n.p.o. with KUB in a.m.  Due to ESRD on HD, no IV fluids per nephrology.  Patient refused NG tube so far, currently no abdominal pain, no nausea vomiting.  Patient stated that he feels okay.  However we will continue n.p.o. except meds and ice chips, with no IV fluid and KUB in a.m.  Neurologically stable.  Rosalin Hawking, MD PhD Stroke Neurology 12/30/2017 7:14 PM   Please page stroke NP  Or  PA  Or MD  from 8am -4 pm  as this patient from this time will be  followed by the stroke.   You can look them up on [email or web address]  Password [email or web address]

## 2017-12-30 NOTE — Progress Notes (Signed)
Nutrition Follow-up  DOCUMENTATION CODES:   Underweight  INTERVENTION:  1. Nepro Shake po BID, each supplement provides 425 kcal and 19 grams protein w/ diet advancement  2.Conitnue Rena-Vit  NUTRITION DIAGNOSIS:   Unintentional weight loss related to acute illness as evidenced by percent weight loss. -ongoing  GOAL:   Patient will meet greater than or equal to 90% of their needs -not meeting with NGT to LIWS  MONITOR:   PO intake, I & O's, Labs, Weight trends, Supplement acceptance  REASON FOR ASSESSMENT:   LOS    ASSESSMENT:   [name] is a 52 y.o. male with PMH of ESRD on Dialysis, HTN, Hx of CVA without deficits, BPH and Depression that presents to the ED as a code stroke with reports of acute onset Left sided weakness, Left facial droop, decreased sensation on the Left, slightly dysarthric speech. Found with acute right thalamic hemorrhage, and left thalamic lacunar infarct  Now with NGT to LIWS due to possible bowel obstruction NPO PO was 100% prior to NPO status Weight now 123 pounds  Labs reviewed:  CBGs 126, 110, 95 Na 129,  Medications reviewed and include:  Calcium Carbonate, INsulin, Rena-Vit, Miralax, REnevla Iron gtt  Diet Order:  Fall precautions  EDUCATION NEEDS:   Education needs have been addressed  Skin:  Skin Assessment: Skin Integrity Issues: Skin Integrity Issues:: Stage II Stage II: to ball of L foot  Last BM:  12/30/2016  Height:   Ht Readings from Last 1 Encounters:  12/07/17 5\' 11"  (1.803 m)    Weight:   Wt Readings from Last 1 Encounters:  12/27/17 123 lb 10.9 oz (56.1 kg)    Ideal Body Weight:  78.18 kg  BMI:  Body mass index is 17.25 kg/m.  Estimated Nutritional Needs:   Kcal:  12/29/17 calories (ABW x30-35)  Protein:  82-95 grams  Fluid:  UOP +1L   [phone]. Nethan Caudillo, MS, RD LDN Inpatient Clinical Dietitian Pager [phone]

## 2017-12-30 NOTE — Procedures (Signed)
I have personally attended this patient's dialysis session.   Says abd pain is better Plain films showed air filled bowl loops I suspect ileus rather than obstruction Would not place NG tube at this time  UFG 2.5 liters 2K bath (K 4.4) No L AVF issues  Camille Bal, MD [facility] [phone] Pager 12/30/2017, 5:33 PM         Narayan Scull B

## 2017-12-30 NOTE — Progress Notes (Signed)
[facility] Progress Note   Subjective:   Says ate dairy after dinner last night, had some abd cramps,then "accidents in the bed but couldn't go on the toilet" No breakfast yet For HD and probable discharge to [facility] today   Objective Vitals:   12/29/17 2200 12/30/17 0200 12/30/17 0500 12/30/17 0918  BP: (!) 156/70 (!) 157/63 (!) 174/71 (!) 167/80  Pulse: 72 79 74 74  Resp: 16 18 18    Temp: 97.8 F (36.6 C) 97.9 F (36.6 C) 97.7 F (36.5 C) 98.7 F (37.1 C)  TempSrc: Oral Oral Oral Oral  SpO2: 97% 97% 96% 97%  Weight:      Height:       Physical Exam Lying in bed NAD but says terrible night VS as ntoed Lungs clear Normal heart sounds No murmur Abd minimal tenderness, good BS Dense L hemiparesis (some L thumb movement) Dialysis Access: L AVF +bruit   : Recent Labs  Lab 12/24/17 0500 12/25/17 1156 12/27/17 0500 12/28/17 0230 12/29/17 0502  NA 130* 129* 132* 133* 131*  K 4.3 4.3 3.8 3.7 3.9  CL 91* 89* 92* 94* 92*  CO2 23 26 25 27 23   GLUCOSE 91 100* 120* 130* 92  BUN 45* 21* 28* 16 32*  CREATININE 7.63* 5.48* 5.48* 3.81* 5.67*  CALCIUM 8.4* 9.0 9.1 9.3 8.8*  PHOS 5.4* 5.2*  --   --   --     Recent Labs  Lab 12/24/17 0500 12/25/17 1157 12/27/17 0500 12/28/17 0230 12/29/17 0502  WBC 7.4 5.7 5.9 5.4 6.0  HGB 10.6* 11.5* 10.4* 11.0* 11.0*  HCT 32.6* 35.8* 33.5* 35.5* 35.3*  MCV 90.3 92.5 94.1 95.4 94.9  PLT 323 325 296 291 318    Recent Labs  Lab 12/28/17 2149 12/29/17 0609 12/29/17 1133 12/29/17 1625 12/29/17 2120  GLUCAP 103* 105* 116* 95 110*    Lab Results  Component Value Date   INR 0.93 12/07/2017   INR 0.94 11/02/2016   INR 1.00 04/28/2015   Medications: . ferric gluconate (FERRLECIT/NULECIT) IV Stopped (12/27/17 1430)   .  stroke: mapping our early stages of recovery book   Does not apply Once  . amLODipine  10 mg Oral QHS  . aspirin EC  81 mg Oral Daily  . atorvastatin  10 mg Oral q1800  . calcitRIOL  0.75  mcg Oral Once per day on Tue Thu Sat  . calcium carbonate  1 tablet Oral Daily  . cloNIDine  0.2 mg Oral BID  . [START ON 01/01/2018] darbepoetin (ARANESP) injection - DIALYSIS  200 mcg Intravenous Q Thu-HD  . feeding supplement (NEPRO CARB STEADY)  237 mL Oral BID BM  . hydrALAZINE  75 mg Oral Q8H  . insulin aspart  0-9 Units Subcutaneous TID WC  . multivitamin  1 tablet Oral QHS  . pantoprazole  40 mg Oral QHS  . polyethylene glycol  17 g Oral Daily  . senna-docusate  1 tablet Oral BID  . sevelamer carbonate  800 mg Oral TID WC   Dialysis Orders:  TTS at [facility] 4hr  65kg  2/2bath  AVF  Hep 2000 (NO heparin x 4 wks from bleed/ CVA, 4 wks = Feb 6) - Mircera Feb 8 IV q 2 weeks (last 1/3) - Calcitriol 0.67mcg PO q HD  Assessment/Plan:  1. 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 L facial droop. Only recover has been L thumb.  2. ESRD-HD TTS.  Per neuro wait 4 wks (Feb 6th)  before resuming heparin w HD, wait longer if possible 3. Anemiaof CKD-Hgb stable.On IV iron and Aranesp qHD (Tues) 4. 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 Continue VDRA and binders. 5. HTN/volume- on norvasc/ hydral/ clonidine, BP's good, 9 kg under prior dry wt  Appears new EDW will be around 56 kg 6. Nutrition- Renal diet, rena-vite,Nepro TID.  7. Type 2 DM - per primary 8. Dispo - to SNF in [place], anticipate transfer today  Camille Bal, MD [facility] [phone] Pager 12/30/2017, 10:06 AM

## 2017-12-30 NOTE — Progress Notes (Signed)
CSW following for discharge plan. CSW contacted Amy at [facility] to discuss needs for accepting patient to outpatient dialysis at [facility]. CSW faxed over hemodialysis run logs and chest X-ray, but still does not have the Medicaid pending number at this time. Patient will need the pending Medicaid number prior to being accepted for outpatient dialysis at [facility]. Amy also faxed over financial form for the patient to fill out; patient is currently at dialysis and will have to fill out the forms later.  CSW will continue to follow.  Blenda Nicely, [place] Clinical Social Worker [phone]

## 2017-12-30 NOTE — Progress Notes (Signed)
Pt transported in recliner chair to HD.

## 2017-12-30 NOTE — Plan of Care (Signed)
  Progressing Education: Knowledge of disease or condition will improve 12/30/2017 1134 - Progressing by Quentin Cornwall, RN Knowledge of secondary prevention will improve 12/30/2017 1134 - Progressing by Quentin Cornwall, RN Knowledge of patient specific risk factors addressed and post discharge goals established will improve 12/30/2017 1134 - Progressing by Quentin Cornwall, RN Coping: Will verbalize positive feelings about self 12/30/2017 1134 - Progressing by Quentin Cornwall, RN Will identify appropriate support needs 12/30/2017 1134 - Progressing by Quentin Cornwall, RN Health Behavior/Discharge Planning: Ability to manage health-related needs will improve 12/30/2017 1134 - Progressing by Quentin Cornwall, RN Self-Care: Ability to participate in self-care as condition permits will improve 12/30/2017 1134 - Progressing by Quentin Cornwall, RN Verbalization of feelings and concerns over difficulty with self-care will improve 12/30/2017 1134 - Progressing by Quentin Cornwall, RN Ability to communicate needs accurately will improve 12/30/2017 1134 - Progressing by Quentin Cornwall, RN Nutrition: Risk of aspiration will decrease 12/30/2017 1134 - Progressing by Quentin Cornwall, RN Dietary intake will improve 12/30/2017 1134 - Progressing by Quentin Cornwall, RN Intracerebral Hemorrhage Tissue Perfusion: Complications of Intracerebral Hemorrhage will be minimized 12/30/2017 1134 - Progressing by Quentin Cornwall, RN Education: Knowledge of General Education information will improve 12/30/2017 1134 - Progressing by Quentin Cornwall, RN

## 2017-12-31 ENCOUNTER — Inpatient Hospital Stay (HOSPITAL_COMMUNITY): Payer: Self-pay

## 2017-12-31 LAB — GLUCOSE, CAPILLARY
GLUCOSE-CAPILLARY: 182 mg/dL — AB (ref 65–99)
Glucose-Capillary: 113 mg/dL — ABNORMAL HIGH (ref 65–99)
Glucose-Capillary: 118 mg/dL — ABNORMAL HIGH (ref 65–99)
Glucose-Capillary: 109 mg/dL — ABNORMAL HIGH (ref 65–99)

## 2017-12-31 MED ORDER — POLYETHYLENE GLYCOL 3350 17 G PO PACK
17.0000 g | PACK | Freq: Two times a day (BID) | ORAL | Status: DC
  Administered 2017-12-31 – 2018-01-02 (×3): 17 g via ORAL
  Filled 2017-12-31 (×8): qty 1

## 2017-12-31 NOTE — Clinical Social Work Note (Signed)
CSW talked with [facility] admissions director, Bjorn Loser regarding patient's discharge to them. She was informed that his dialysis has been set-up at [facility] in [place] and he would need to be at the HD Center at 9:15 am first session to do paperwork and his chair time is 9:45 am. Bjorn Loser is agreeable to patient discharging to them on Sunday and they will get him to dialysis on Tuesday. Nurse case manager contacted and updated.  Genelle Bal, MSW, LCSW Licensed Clinical Social Worker Clinical Social Work Department [employer] [phone]

## 2017-12-31 NOTE — Clinical Social Work Note (Signed)
CSW talked with patient's Medicaid worker , [employer] and was provided with Wesley Burns' Medicaid number (application pending) - [id number] O. This number will be provided to patient for the [facility] paperwork he is completing.  Genelle Bal, MSW, LCSW Licensed Clinical Social Worker Clinical Social Work Department [employer] [phone]

## 2017-12-31 NOTE — Progress Notes (Signed)
Occupational Therapy Treatment Patient Details Name: Wesley Burns MRN: [id number] DOB: 1966/05/28 Today's Date: 12/31/2017    History of present illness 52 y.o. male with ESRD, Type 2 DM, HTN, GERD, and Hx of Right basal ganglia CVA (2017), right transmet amputation. Admitted with right thalamic hemorrhagic stroke.   OT comments  Pt making progress with functional goals. This session focused on self feeding in sitting, cognitions, L attention/awareness, UB ADLs. Upon entering room, pt's L UE dangling off side of recliner and pt unaware. L UE repositioned with pillow. OT will continue to follow acutely  Follow Up Recommendations  SNF    Equipment Recommendations  None recommended by OT    Recommendations for Other Services      Precautions / Restrictions Precautions Precautions: Fall;Other (comment) Precaution Comments: left foot ulcer Restrictions Weight Bearing Restrictions: No       Mobility Bed Mobility               General bed mobility comments: pt up in recliner upon arrival  Transfers                 General transfer comment: NT    Balance Overall balance assessment: Needs assistance Sitting-balance support: Single extremity supported;Feet supported Sitting balance-Leahy Scale: Fair Sitting balance - Comments: pt up in recliner upon arrival                                   ADL either performed or assessed with clinical judgement   ADL   Eating/Feeding: Set up;Supervision/ safety;Sitting Eating/Feeding Details (indicate cue type and reason): pt up in recliner Grooming: Wash/dry hands;Wash/dry face;Bed level;Brushing hair;Set up;Sitting   Upper Body Bathing: Sitting;Moderate assistance   Lower Body Bathing: Maximal assistance;Sitting/lateral leans                         General ADL Comments: pt up incliner with lunch upon arrival and requred set up of tray to cut chicken, open condiments for drinks     Vision  Baseline Vision/History: Wears glasses Patient Visual Report: No change from baseline     Perception     Praxis      Cognition Arousal/Alertness: Awake/alert Behavior During Therapy: WFL for tasks assessed/performed Overall Cognitive Status: Impaired/Different from baseline Area of Impairment: Problem solving                       Following Commands: Follows multi-step commands consistently     Problem Solving: Slow processing;Decreased initiation;Difficulty sequencing;Requires verbal cues;Requires tactile cues General Comments: focused on following multistep commands and attention to L side for functional with min verbal cues        Exercises     Shoulder Instructions       General Comments      Pertinent Vitals/ Pain       Pain Assessment: No/denies pain  Home Living                                          Prior Functioning/Environment              Frequency  Min 2X/week        Progress Toward Goals  OT Goals(current goals can now be found in the care plan section)  Progress  towards OT goals: Progressing toward goals     Plan Discharge plan remains appropriate    Co-evaluation                 AM-PAC PT "6 Clicks" Daily Activity     Outcome Measure   Help from another person eating meals?: A Little Help from another person taking care of personal grooming?: A Little Help from another person toileting, which includes using toliet, bedpan, or urinal?: A Lot Help from another person bathing (including washing, rinsing, drying)?: A Lot Help from another person to put on and taking off regular upper body clothing?: A Lot Help from another person to put on and taking off regular lower body clothing?: A Lot 6 Click Score: 14    End of Session    OT Visit Diagnosis: Unsteadiness on feet (R26.81);Muscle weakness (generalized) (M62.81);Hemiplegia and hemiparesis Hemiplegia - Right/Left: Left Hemiplegia -  dominant/non-dominant: Non-Dominant Hemiplegia - caused by: Cerebral infarction   Activity Tolerance Patient tolerated treatment well   Patient Left in chair;with chair alarm set;with call bell/phone within reach   Nurse Communication      Functional Assessment Tool Used: AM-PAC 6 Clicks Daily Activity   Time: 1025-8527 OT Time Calculation (min): 39 min  Charges: OT G-codes **NOT FOR INPATIENT CLASS** Functional Assessment Tool Used: AM-PAC 6 Clicks Daily Activity OT General Charges $OT Visit: 1 Visit OT Treatments $Self Care/Home Management : 23-37 mins $Therapeutic Activity: 8-22 mins     Galen Manila 12/31/2017, 1:42 PM

## 2017-12-31 NOTE — Progress Notes (Signed)
NEUROHOSPITALISTS - STROKE TEAM DAILY PROGRESS NOTE   SUBJECTIVE Wesley Burns at bedside today. Pt is found laying in bed in NAD. Denies any abdominal pain, Nausea or vominting, passing gas. Patient refused NGT insertion overnight. No other acute event reported overnight. Repeat Abd Xray is negative for obstruction or ileus. Patient was scheduled for discharge today but per MSW we continue to await acceptance by outpatient HD center for transfer to SNF.   Physical Examination: Vitals:   12/31/17 0200 12/31/17 0600 12/31/17 1004 12/31/17 1411  BP: 140/64 (!) 124/56 135/65 108/62  Pulse: 66 65 73 66  Resp: _0 Temp: 98.8 F (37.1 C) 98.9 F (37.2 C) 98.5 F (36.9 C) 97.8 F (36.6 C)  TempSrc: Oral Oral Oral Oral  SpO2: 95% 95% 97% 98%  Weight:      Height:       HEENT-  Normocephalic,  Cardiovascular - Regular rate and rhythm, + murmur Respiratory - Lungs clear bilaterally. Non-labored breathing, No wheezing. Abdomen - soft and tender, BS normal, complaining of pain with palpation Extremities- no edema or cyanosis Skin-warm and dry  Neurological Examination Mental Status: Awake,oriented x 3  Speech is slightly dysarthric. Able to follow 3 step commands without difficulty. Cranial Nerves: II: Left field cut. Pupils are equal, round, and reactive to light.   III,IV, VI: EOMI without ptosis or diploplia. No nystagmus.   V: Facial sensation is decreased on the Left VII: Facial movement is decreased on the Left VIII: hearing is intact to voice X: Uvula elevates symmetrically XI: Shoulder shrug is decreased on the left XII: tongue is midline without atrophy or fasciculations.  Motor: Tone is normal. Bulk is normal. 5/5 strength on the Right.  Dense left hemiplegia and Strength is 1/5 on the Left leg and 0-1/5 in left  arm   Sensor: Sensation is decreased on the Left in the arms and legs. Normal on the Right Deep Tendon Reflexes: 1+ and symmetric throughout  in the biceps and patellae Plantars: Toes are downgoing bilaterally.  Cerebellar: unable to perform finger-to-nose on Left Gait: Truncal ataxia to Left   Lab Results: CBC: Recent Labs  Lab 12/28/17 0230 12/29/17 0502 12/30/17 1225  WBC 5.4 6.0 9.4  HGB 11.0* 11.0* 12.0*  HCT 35.5* 35.3* 36.3*  MCV 95.4 94.9 91.2  PLT 291 318 161   Basic Metabolic Panel: Recent Labs  Lab 12/25/17 1156 12/27/17 0500 12/28/17 0230 12/29/17 0502 12/30/17 1225  NA 129* 132* 133* 131* 129*  K 4.3 3.8 3.7 3.9 4.4  CL 89* 92* 94* 92* 90*  CO2 _1 20*  GLUCOSE 100* 120* 130* 92 151*  BUN 21* 28* 16 32* 56*  CREATININE 5.48* 5.48* 3.81* 5.67* 8.02*  CALCIUM 9.0 9.1 9.3 8.8* 8.5*  PHOS 5.2*  --   --   --  6.0*   Liver Function Tests: Recent Labs  Lab 12/25/17 1156 12/30/17 1225  ALBUMIN 3.6 3.5   Imaging:  Ct Head Code Stroke Wo Contrast Result Date: 12/07/2017 IMPRESSION:  1. Acute right thalamic hemorrhage. Mild edema and local mass effect.  2. 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  3. New age indeterminate left thalamic lacunar infarct.   Ct Head Wo Contrast 12/27/2017  IMPRESSION: Recent right thalamic intraparenchymal hematoma is now isodense to edematous brain.  No evidence of additional bleeding. Old hemorrhagic infarction in the right basal ganglia. Resolution of intraventricular blood.  Ventricular size stable.  DG  Chest Port 1 View 12/26/2017 IMPRESSION: No active disease.  Ct Angio Head W Or Wo Contrast Result Date: 12/07/2017 IMPRESSION: 1. No aneurysm, vascular malformation, or spot sign associated with the right thalamic hemorrhage. 2. Patent circle of Willis with intracranial atherosclerosis as above.  Moderate to severe right PCA stenoses.   Ct Head Wo Contrast Result Date: 12/08/2017 IMPRESSION:  1. Stable right thalamic hematoma and small volume of hemorrhage in right lateral ventricle.  2. Mild  increase of mass effect with 5 mm right-to-left midline shift, previously 4 mm.  3. No new acute intracranial abnormality identified.   ECHO  12/12/2017 Study Conclusions - Left ventricle: The cavity size was normal. There was moderate   concentric hypertrophy. Systolic function was normal. The   estimated ejection fraction was in the range of 55% to 60%. Wall   motion was normal; there were no regional wall motion   abnormalities. There was a reduced contribution of atrial   contraction to ventricular filling, due to increased ventricular   diastolic pressure or atrial contractile dysfunction. Doppler   parameters are consistent with a reversible restrictive pattern,   indicative of decreased left ventricular diastolic compliance   and/or increased left atrial pressure (grade 3 diastolic   dysfunction). Doppler parameters are consistent with high   ventricular filling pressure. - Aortic valve: Trileaflet; mildly thickened, mildly calcified   leaflets. Moderate focal calcification involving the left   coronary cusp. - Aorta: Ascending aorta diameter: 40 mm (ED). - Ascending aorta: The ascending aorta was mildly dilated. - Mitral valve: Calcified annulus. There was mild regurgitation. - Left atrium: The atrium was mildly dilated. - Right ventricle: The cavity size was mildly dilated. Wall   thickness was normal. - Tricuspid valve: There was trivial regurgitation  Carotid Duplex           12/11/2017 Final Interpretation: Right Carotid:  The extracranial vessels were near-normal with only minimal wall thickening or plaque. Left Carotid: The extracranial vessels were near-normal with only minimal wall thickening or plaque. Vertebrals: Left vertebral artery was patent with antegrade flow.  Right vertebral artery was not visualized. Subclavians: Normal flow hemodynamics were seen in the right subclavian artery.  Left subclavian artery exhibits monophasic flow, suggestive of possbile  proximal obstruction  ABDOMEN - 1 VIEW      12/30/2017 11:34 IMPRESSION: Air-filled loops of small and large bowel noted suggesting adynamic ileus. Follow-up exam suggested to exclude bowel obstruction.  ABDOMEN - 1 VIEW      12/31/2017 09:48 IMPRESSION No evidence of bowel obstruction or ileus.     IMPRESSION: [name] is a 52 y.o. male with PMH of ESRD on Dialysis, HTN, Hx of CVA without deficits, BPH and Depression that presents to the ED as a code stroke with reports of acute onset Left sided weakness, Left facial droop, decreased sensation on the Left, slightly dysarthric speech. . EMS reports that patient fell out of his bed at approximately 15:40. Patients room mate states that he last saw him normal at 07:00 this morning. CT Head reveals:  Acute right thalamic hemorrhage with mild cytotoxic edema New age indeterminate left thalamic lacunar infarct  Suspected Etiology: HTN hemorrhage and small vessel disease Resultant Symptoms: Left sided weakness, Left facial droop, decreased sensation on Left Stroke Risk Factors: hypertension, ESRD on Dialysis Other Stroke Risk Factors: Advanced age, ?ETOH use, Hx stroke  Outstanding Stroke Work-up Studies:   Work up completed at this time  12/30/2017: Patient had large bowel movement  overnight, complaining of abdominal pain this morning.  He will be showed air loops, suggesting adynamic ileus.  GI consult recommended NG tube for suctioning, n.p.o. with KUB in a.m.  Due to ESRD on HD, no IV fluids per nephrology.  Patient refused NG tube so far, currently no abdominal pain, no nausea vomiting.  Patient stated that he feels okay.  However we will continue n.p.o. except meds and ice chips, with no IV fluid and KUB in a.m.  Neurologically stable.  12/31/2017: Repeat Abdominal Xray shows no bowel obstruction or ileus today. Patient denies any abdominal pain on exam today. Diet advanced and bowel regime in progress. Remains neurologically  stable. HD in AM. Awaiting admission decision from outpatient HD facility for transfer to SNF in [place]. Patient and Wesley Burns updated on POC.   PLAN  12/31/2017: Awaiting bed in SNF for discharge - Plan once Medicaid number accepted by HD outpatient facility Frequent Neurochecks  Telemetry Monitoring Continue to HOLD Aspirin as patient has a right ICH For worsening mentation, obtain STAT Head CT- and contact Neurology immediately F/U PT/OT/SLP at SNF Ongoing aggressive stroke risk factor management Patient counseled to be compliant with his antithrombotic medications Patient counseled on Lifestyle modifications including, Diet, Exercise, and Stress Follow up with [facility] in 6 weeks  HX OF STROKES:  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  DYSPHAGIA:  Passed SLP swallow evaluation- Dysphagia 3 diet  Aspiration Precautions in progress  MEDICAL ISSUES:  Possible Bowel Obstruction   - Resolved 12/31/2017 Consulted GI by phone Dr Benson Norway - 1/29 Patient refused placement of NGT  Patient turned every 2 hrs overnight Abdominal Xray today - resolved. No obstruction or ileus Diet advanced  Will continue to monitor closely, Denies abdominal pain today, passing gas  ESRD on Dialysis - HD  (Thursday)  Stable, Nephrology following, Appreciate Assistance- HD in AM  HD today  Creatinine - 8.02  Anemia Chronic  Stable, trending up slowly   Iron Studies in progress  Nephrology to start ESA, IV Iron, Aranesp qHD  Repeat labs in AM  Hyperkalemia Stable - HD  - K+4.4 Repeat labs in AM  Hyponatremia  Stable, Nephrology following  Na 129   Hypocalcemia - Secondary Hyperparathyroidism  Stable, Nephrology following   8.5  Elevated Alk Phos  Stable, Nephrology following  12/17/2017 - 253  Elevated Phosphorus  Stable, Nephrology following 6.0  Leg Spasms  Zanaflex BID PRN  Nausea Zofran PRN Will continue to monitor closely  Diarrhea-  Resolved  Imodium PRN  HYPERTENSION:  Stable, some elevated B/P's noted overnight  SBP goal less than 140/90 most of the time  Clonidine dose increased to 0.2 mg BID  Labetolol PRN  Long term BP goal normotensive.  Patient currently on: Norvasc, Hydralazine  HYPERLIPIDEMIA  Home Meds:  NONE  LDL  goal < 70, LDL 73  Continue Lipitor 10 mg  Continue statin at discharge  DIABETES:  HgbA1c goal < 7.0, HgbA1C = 4.0  Currently on:  Novolog  Continue CBG monitoring and SSI to maintain glucose 140-180 mg/dl  DM education   Other Active Problems: Active Problems:   IVH (intraventricular hemorrhage) (HCC)   Dysphagia, post-stroke   Type 2 diabetes mellitus with peripheral neuropathy (HCC)   History of tobacco abuse   Benign essential HTN   ESRD on dialysis ([place])   History of traumatic brain injury   History of CVA with residual deficit   History of transmetatarsal amputation of foot ([place])   ICH (intracerebral  hemorrhage) [facility])    Hospital day # 24 VTE prophylaxis: SCD's  Diet - Fall precautions Diet renal/carb modified with fluid restriction Diet-HS Snack? Nothing; Fluid restriction: 1200 mL Fluid; Room service appropriate? Yes; Fluid consistency: Thin   FAMILY UPDATES:  family at bedside  TEAM UPDATES:Jalesa Thien, MD   Prior Home Stroke Medications:  No antithrombotic  Discharge Stroke Meds:  Please discharge patient on TBD , will likely add ASA prior to discharge  Disposition: [facility]   Therapy Recs:   12/31/2017, Discussion with MSW,                                 Discharge to [facility] once Medicaid number accepted by                                 Outpatient HD facility  Follow Up:  Follow-up Information    Garvin Fila, MD. Schedule an appointment as soon as possible for a visit in 6 week(s).   Specialties:  Neurology, Radiology Contact information: [address] [place] [address] [place],  [facility], MD -PCP Follow up in 1-2 weeks      Assessment & plan discussed with with attending physician and they are in agreement.    Mary Sella, ANP-C Stroke Neurology Team 12/31/2017 2:30 PM   ATTENDING NOTE: I reviewed above note and agree with the assessment and plan. I have made any additions or clarifications directly to the above note. Pt was seen and examined.   Patient no distress or abdominal pain today, KUB showed no lieus or obstruction. Resumed diet and pt tolerating well. Still has left hemiplegia. Pending SNF. And will do HD tomorrow.   Rosalin Hawking, MD PhD Stroke Neurology 12/31/2017 5:55 PM   Please page stroke NP  Or  PA  Or MD from 8am -4 pm  as this patient from this time will be  followed by the stroke.   You can look them up on [email or web address]  Password [email or web address]

## 2017-12-31 NOTE — Progress Notes (Signed)
[facility] Progress Note   Subjective:   Ileus vs obstr on plain xray from yesterday No NG tube, no N/V Resolved on plain films today I think could advance diet and see how he does  Objective Vitals:   12/30/17 1750 12/30/17 2200 12/31/17 0200 12/31/17 0600  BP: (!) 154/68 (!) 142/72 140/64 (!) 124/56  Pulse: 70 67 66 65  Resp: 18 18 18 18   Temp: 98.1 F (36.7 C) 99.8 F (37.7 C) 98.8 F (37.1 C) 98.9 F (37.2 C)  TempSrc: Oral Oral Oral Oral  SpO2: 98% 96% 95% 95%  Weight:      Height:       Physical Exam Lying in bed NAD  VS as ntoed Lungs clear Normal heart sounds No murmur Abd + BS. Not at all tender Dense L hemiparesis (some L thumb movement) Dialysis Access: L AVF +bruit   : Recent Labs  Lab 12/25/17 1156  12/28/17 0230 12/29/17 0502 12/30/17 1225  NA 129*   < > 133* 131* 129*  K 4.3   < > 3.7 3.9 4.4  CL 89*   < > 94* 92* 90*  CO2 26   < > 27 23 20*  GLUCOSE 100*   < > 130* 92 151*  BUN 21*   < > 16 32* 56*  CREATININE 5.48*   < > 3.81* 5.67* 8.02*  CALCIUM 9.0   < > 9.3 8.8* 8.5*  PHOS 5.2*  --   --   --  6.0*   < > = values in this interval not displayed.    Recent Labs  Lab 12/25/17 1157 12/27/17 0500 12/28/17 0230 12/29/17 0502 12/30/17 1225  WBC 5.7 5.9 5.4 6.0 9.4  HGB 11.5* 10.4* 11.0* 11.0* 12.0*  HCT 35.8* 33.5* 35.5* 35.3* 36.3*  MCV 92.5 94.1 95.4 94.9 91.2  PLT 325 296 291 318 313    Recent Labs  Lab 12/29/17 2120 12/30/17 1117 12/30/17 1748 12/30/17 2150 12/31/17 0609  GLUCAP 110* 126* 116* 99 113*    Lab Results  Component Value Date   INR 0.93 12/07/2017   INR 0.94 11/02/2016   INR 1.00 04/28/2015   Medications:  .  stroke: mapping our early stages of recovery book   Does not apply Once  . amLODipine  10 mg Oral QHS  . aspirin EC  81 mg Oral Daily  . atorvastatin  10 mg Oral q1800  . calcitRIOL  0.75 mcg Oral Once per day on Tue Thu Sat  . calcium carbonate  1 tablet Oral Daily  .  cloNIDine  0.2 mg Oral BID  . [START ON 01/01/2018] darbepoetin (ARANESP) injection - DIALYSIS  200 mcg Intravenous Q Thu-HD  . feeding supplement (NEPRO CARB STEADY)  237 mL Oral BID BM  . hydrALAZINE  75 mg Oral Q8H  . insulin aspart  0-9 Units Subcutaneous TID WC  . multivitamin  1 tablet Oral QHS  . pantoprazole  40 mg Oral QHS  . polyethylene glycol  17 g Oral Daily  . senna-docusate  1 tablet Oral BID  . sevelamer carbonate  800 mg Oral TID WC   Dialysis Orders:  TTS at [facility] 4hr  65kg New EDW will be around 56 kg 2/2bath  AVF  Hep 2000 (NO heparin x 4 wks from bleed/ CVA, 4 wks = Feb 6) - Mircera 06-12-1982 IV q 2 weeks (last 1/3) - Calcitriol 0.38mcg PO q HD  Assessment/Plan:  1. R Thalamic Hemorrhagic Stroke -  dense L HP. L facial droop. Only recover has been L thumb.  2. ESRD-HD TTS.  Per neuro wait 4 wks (Feb 6th) before resuming heparin w HD, wait longer if possible. HD tomorrow if still here 3. Anemiaof CKD-Hgb stable.On IV iron and Aranesp qHD (Tues) 4. 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 Continue VDRA and binders. 5. HTN/volume- on norvasc/ hydral/ clonidine, BP's good, 9 kg under prior dry wt  Appears new EDW will be around 56 kg.  6. Nutrition- Renal diet, rena-vite,Nepro TID.  7. Type 2 DM - per primary 8. Abdominal pain 1/29 "ileus vs obstruction" on plain films, resolved on today's xrays. I think was just an ileus. Advance diet 9. Dispo - to SNF in [place], [place] [facility] [phone] Pager 12/31/2017, 9:52 AM

## 2017-12-31 NOTE — Progress Notes (Addendum)
Form from [employer] filled out with the patient and faxed back to Doroteo Glassman at number provided:  [phone].  Addendum: 1451: have receive Medicaid pending number. [facility] able to see patient on Thursday. CSW spoke to [facility] [place] and they can accept the patient today. Pt unable to be seen at [facility] for first treatment on Saturday. Patient will have to have dialysis here on Saturday and go to [place] on Sunday. NP updated.   1500: CM spoke to [facility] and informed them that pt would be d/ced to [place] on Sunday and would need dialysis on Tuesday the 5th. His chair time is 9:45 but be needs to be at the center at 9:15 for paperwork.

## 2017-12-31 NOTE — Progress Notes (Signed)
Physical Therapy Treatment Patient Details Name: Wesley Burns MRN: [id number] DOB: [birth year] Today's Date: 12/31/2017    History of Present Illness 52 y.o. male with ESRD, Type 2 DM, HTN, GERD, and Hx of Right basal ganglia CVA (2017), right transmet amputation. Admitted with right thalamic hemorrhagic stroke.    PT Comments    Pt very motivated and tolerating therapy well. Pt with increased sitting EOB tolerance and able to maintain midline without support. Pt remains to have no quad control requiring L knee to be blocked in standing however pt is demonstrating active L LE gluts, hamstrings and adductors. Acute PT to con't to follow.   Follow Up Recommendations  Supervision/Assistance - 24 hour;SNF     Equipment Recommendations  Other (comment)    Recommendations for Other Services Rehab consult     Precautions / Restrictions Precautions Precautions: Fall(L hemi) Precaution Comments: R trans met, L toe amputations Restrictions Weight Bearing Restrictions: No    Mobility  Bed Mobility Overal bed mobility: Needs Assistance Bed Mobility: Supine to Sit     Supine to sit: Mod assist     General bed mobility comments: modA to hook R foot under L foot to bring off EOB, modA for trunk elevation due to inability to use L UE functionally  Transfers Overall transfer level: Needs assistance   Transfers: Sit to/from [employer] Transfers Sit to Stand: Mod assist;+2 physical assistance Stand pivot transfers: Max assist;+2 physical assistance       General transfer comment: L knee blocked, pt used R UE and LE to push up, with v/c's pt able to contract buttocks and retract shoulders to achieve full upright posture, unable to maintain greater than 30 sec. worked on L LE strengthening, active quad set in stedy. pt tolerated standing x 5-6 min wiht assist to maintain L grip on steady and minA to help with posture while PT was able to focus on mechanics and strengthening of L  LE  Ambulation/Gait                 Stairs            Wheelchair Mobility    Modified Rankin (Stroke Patients Only) Modified Rankin (Stroke Patients Only) Pre-Morbid Rankin Score: No symptoms Modified Rankin: Severe disability     Balance Overall balance assessment: Needs assistance Sitting-balance support: Feet supported;No upper extremity supported Sitting balance-Leahy Scale: Fair Sitting balance - Comments: pt able to maintain midline while sitting EOB x 5 min without assist   Standing balance support: Bilateral upper extremity supported Standing balance-Leahy Scale: Zero Standing balance comment: requires assist                            Cognition Arousal/Alertness: Awake/alert Behavior During Therapy: WFL for tasks assessed/performed Overall Cognitive Status: Within Functional Limits for tasks assessed Area of Impairment: Problem solving                       Following Commands: Follows multi-step commands consistently     Problem Solving: Slow processing;Decreased initiation;Difficulty sequencing;Requires verbal cues;Requires tactile cues General Comments: pt much improved this date and follow simple commands consistantly      Exercises Other Exercises Other Exercises: patient in supine with L knee in hooklying, focused on not letting LE fall externally, pt demo'd some active adductor contraction Other Exercises: worked on resisted hip knee extension    General Comments  Pertinent Vitals/Pain Pain Assessment: No/denies pain    Home Living                      Prior Function            PT Goals (current goals can now be found in the care plan section) Progress towards PT goals: Progressing toward goals    Frequency    Min 3X/week      PT Plan Current plan remains appropriate    Co-evaluation              AM-PAC PT "6 Clicks" Daily Activity  Outcome Measure  Difficulty turning over  in bed (including adjusting bedclothes, sheets and blankets)?: Unable Difficulty moving from lying on back to sitting on the side of the bed? : Unable Difficulty sitting down on and standing up from a chair with arms (e.g., wheelchair, bedside commode, etc,.)?: Unable Help needed moving to and from a bed to chair (including a wheelchair)?: A Lot Help needed walking in hospital room?: Total Help needed climbing 3-5 steps with a railing? : Total 6 Click Score: 7    End of Session Equipment Utilized During Treatment: Gait belt Activity Tolerance: Patient tolerated treatment well Patient left: in chair;with chair alarm set;with call bell/phone within reach Nurse Communication: Mobility status PT Visit Diagnosis: Unsteadiness on feet (R26.81);Other abnormalities of gait and mobility (R26.89);Hemiplegia and hemiparesis Hemiplegia - Right/Left: Left Hemiplegia - dominant/non-dominant: Non-dominant Hemiplegia - caused by: Other Nontraumatic intracranial hemorrhage     Time: 7044-9252 PT Time Calculation (min) (ACUTE ONLY): 39 min  Charges:  $Therapeutic Exercise: 8-22 mins $Neuromuscular Re-education: 23-37 mins                    G Codes:       Kittie Plater, PT, DPT Pager #: [phone] Office #: [phone]    Gracelee Stemmler M Asheley Hellberg 12/31/2017, 2:41 PM

## 2018-01-01 ENCOUNTER — Inpatient Hospital Stay (HOSPITAL_COMMUNITY): Payer: Self-pay

## 2018-01-01 DIAGNOSIS — R1033 Periumbilical pain: Secondary | ICD-10-CM

## 2018-01-01 LAB — RENAL FUNCTION PANEL
Albumin: 3 g/dL — ABNORMAL LOW (ref 3.5–5.0)
Anion gap: 15 (ref 5–15)
BUN: 43 mg/dL — ABNORMAL HIGH (ref 6–20)
CALCIUM: 8.9 mg/dL (ref 8.9–10.3)
CHLORIDE: 90 mmol/L — AB (ref 101–111)
CO2: 24 mmol/L (ref 22–32)
CREATININE: 6.26 mg/dL — AB (ref 0.61–1.24)
GFR, EST AFRICAN AMERICAN: 11 mL/min — AB (ref >60)
GFR, EST NON AFRICAN AMERICAN: 9 mL/min — AB (ref >60)
Glucose, Bld: 114 mg/dL — ABNORMAL HIGH (ref 65–99)
Phosphorus: 5 mg/dL — ABNORMAL HIGH (ref 2.5–4.6)
Potassium: 4.2 mmol/L (ref 3.5–5.1)
SODIUM: 129 mmol/L — AB (ref 135–145)

## 2018-01-01 LAB — GLUCOSE, CAPILLARY
GLUCOSE-CAPILLARY: 113 mg/dL — AB (ref 65–99)
GLUCOSE-CAPILLARY: 116 mg/dL — AB (ref 65–99)
Glucose-Capillary: 154 mg/dL — ABNORMAL HIGH (ref 65–99)
Glucose-Capillary: 99 mg/dL (ref 65–99)

## 2018-01-01 LAB — CBC
HCT: 36 % — ABNORMAL LOW (ref 39.0–52.0)
Hemoglobin: 11.7 g/dL — ABNORMAL LOW (ref 13.0–17.0)
MCH: 29.7 pg (ref 26.0–34.0)
MCHC: 32.5 g/dL (ref 30.0–36.0)
MCV: 91.4 fL (ref 78.0–100.0)
PLATELETS: 307 10*3/uL (ref 150–400)
RBC: 3.94 MIL/uL — AB (ref 4.22–5.81)
RDW: 18.1 % — ABNORMAL HIGH (ref 11.5–15.5)
WBC: 11.9 10*3/uL — AB (ref 4.0–10.5)

## 2018-01-01 MED ORDER — DARBEPOETIN ALFA 200 MCG/0.4ML IJ SOSY
PREFILLED_SYRINGE | INTRAMUSCULAR | Status: AC
  Administered 2018-01-01: 200 ug via INTRAVENOUS
  Filled 2018-01-01: qty 0.4

## 2018-01-01 MED ORDER — LANTHANUM CARBONATE 500 MG PO CHEW
1000.0000 mg | CHEWABLE_TABLET | Freq: Three times a day (TID) | ORAL | Status: DC
Start: 2018-01-01 — End: 2018-01-11
  Administered 2018-01-02 – 2018-01-11 (×17): 1000 mg via ORAL
  Filled 2018-01-01 (×32): qty 2

## 2018-01-01 MED ORDER — BISACODYL 10 MG RE SUPP
10.0000 mg | Freq: Once | RECTAL | Status: AC
  Administered 2018-01-01: 10 mg via RECTAL
  Filled 2018-01-01: qty 1

## 2018-01-01 MED ORDER — CALCITRIOL 0.25 MCG PO CAPS
ORAL_CAPSULE | ORAL | Status: AC
  Administered 2018-01-01: 0.75 ug
  Filled 2018-01-01: qty 3

## 2018-01-01 NOTE — Progress Notes (Signed)
Pt coughing hard to get up thick stringy clear phlegm, coughing mod-large amounts. Pt states he can feel it draining down his throat.  Pt coughing so hard made self throw up some, some tan pieces of food with phlegm.     Pt given IV zofran.  Waiting to go to xray.

## 2018-01-01 NOTE — Progress Notes (Signed)
NEUROHOSPITALISTS - STROKE TEAM DAILY PROGRESS NOTE   SUBJECTIVE No family at bedside today. Pt is found sitting in chair in NAD. Recently returned from HD and complaining of abdominal pain and nausea. No vomiting reported, passing gas. Repeat Abd Xray today is negative for obstruction or ileus.   Physical Examination: Vitals:   01/01/18 1100 01/01/18 1130 01/01/18 1145 01/01/18 1301  BP: 122/65 127/65 131/70 (!) 154/65  Pulse: 67 66 75 83  Resp:   18 16  Temp:   (!) 97.5 F (36.4 C) 98.2 F (36.8 C)  TempSrc:   Oral Oral  SpO2:   98% 96%  Weight:    67.3 kg (148 lb 5.9 oz)  Height:       HEENT-  Normocephalic,  Cardiovascular - Regular rate and rhythm, + murmur Respiratory - Lungs clear bilaterally. Non-labored breathing, No wheezing. Abdomen - soft and tender, BS normal, complaining of pain with palpation Extremities- no edema or cyanosis Skin-warm and dry  Neurological Examination Mental Status: Awake,oriented x 3  Speech is slightly dysarthric. Able to follow 3 step commands without difficulty. Cranial Nerves: II: Left field cut. Pupils are equal, round, and reactive to light.   III,IV, VI: EOMI without ptosis or diploplia. No nystagmus.   V: Facial sensation is decreased on the Left VII: Facial movement is decreased on the Left VIII: hearing is intact to voice X: Uvula elevates symmetrically XI: Shoulder shrug is decreased on the left XII: tongue is midline without atrophy or fasciculations.  Motor: Tone is normal. Bulk is normal. 5/5 strength on the Right.  Dense left hemiplegia and Strength is 1/5 on the Left leg and 0-1/5 in left  arm   Sensor: Sensation is decreased on the Left in the arms and legs. Normal on the Right Deep Tendon Reflexes: 1+ and symmetric throughout in the biceps and patellae Plantars: Toes are downgoing bilaterally.  Cerebellar: unable to perform finger-to-nose on Left Gait: Truncal ataxia to Left   Lab  Results: CBC: Recent Labs  Lab 12/29/17 0502 12/30/17 1225 01/01/18 0814  WBC 6.0 9.4 11.9*  HGB 11.0* 12.0* 11.7*  HCT 35.3* 36.3* 36.0*  MCV 94.9 91.2 91.4  PLT 318 313 409   Basic Metabolic Panel: Recent Labs  Lab 12/27/17 0500 12/28/17 0230 12/29/17 0502 12/30/17 1225 01/01/18 0814  NA 132* 133* 131* 129* 129*  K 3.8 3.7 3.9 4.4 4.2  CL 92* 94* 92* 90* 90*  CO2 25 27 23  20* 24  GLUCOSE 120* 130* 92 151* 114*  BUN 28* 16 32* 56* 43*  CREATININE 5.48* 3.81* 5.67* 8.02* 6.26*  CALCIUM 9.1 9.3 8.8* 8.5* 8.9  PHOS  --   --   --  6.0* 5.0*   Liver Function Tests: Recent Labs  Lab 12/30/17 1225 01/01/18 0814  ALBUMIN 3.5 3.0*   Imaging:  Ct Head Code Stroke Wo Contrast Result Date: 12/07/2017 IMPRESSION:  1. Acute right thalamic hemorrhage. Mild edema and local mass effect.  2. 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  3. New age indeterminate left thalamic lacunar infarct.   Ct Head Wo Contrast 12/27/2017  IMPRESSION: Recent right thalamic intraparenchymal hematoma is now isodense to edematous brain.  No evidence of additional bleeding. Old hemorrhagic infarction in the right basal ganglia. Resolution of intraventricular blood.  Ventricular size stable.  DG Chest Port 1 View 12/26/2017 IMPRESSION: No active disease.  Ct Angio Head W Or Wo Contrast Result Date: 12/07/2017 IMPRESSION: 1. No aneurysm,  vascular malformation, or spot sign associated with the right thalamic hemorrhage. 2. Patent circle of Willis with intracranial atherosclerosis as above.  Moderate to severe right PCA stenoses.   Ct Head Wo Contrast Result Date: 12/08/2017 IMPRESSION:  1. Stable right thalamic hematoma and small volume of hemorrhage in right lateral ventricle.  2. Mild increase of mass effect with 5 mm right-to-left midline shift, previously 4 mm.  3. No new acute intracranial abnormality identified.   ECHO  12/12/2017 Study  Conclusions - Left ventricle: The cavity size was normal. There was moderate   concentric hypertrophy. Systolic function was normal. The   estimated ejection fraction was in the range of 55% to 60%. Wall   motion was normal; there were no regional wall motion   abnormalities. There was a reduced contribution of atrial   contraction to ventricular filling, due to increased ventricular   diastolic pressure or atrial contractile dysfunction. Doppler   parameters are consistent with a reversible restrictive pattern,   indicative of decreased left ventricular diastolic compliance   and/or increased left atrial pressure (grade 3 diastolic   dysfunction). Doppler parameters are consistent with high   ventricular filling pressure. - Aortic valve: Trileaflet; mildly thickened, mildly calcified   leaflets. Moderate focal calcification involving the left   coronary cusp. - Aorta: Ascending aorta diameter: 40 mm (ED). - Ascending aorta: The ascending aorta was mildly dilated. - Mitral valve: Calcified annulus. There was mild regurgitation. - Left atrium: The atrium was mildly dilated. - Right ventricle: The cavity size was mildly dilated. Wall   thickness was normal. - Tricuspid valve: There was trivial regurgitation  Carotid Duplex           12/11/2017 Final Interpretation: Right Carotid:  The extracranial vessels were near-normal with only minimal wall thickening or plaque. Left Carotid: The extracranial vessels were near-normal with only minimal wall thickening or plaque. Vertebrals: Left vertebral artery was patent with antegrade flow.  Right vertebral artery was not visualized. Subclavians: Normal flow hemodynamics were seen in the right subclavian artery.  Left subclavian artery exhibits monophasic flow, suggestive of possbile proximal obstruction  ABDOMEN - 1 VIEW      12/30/2017 11:34 IMPRESSION: Air-filled loops of small and large bowel noted suggesting adynamic ileus. Follow-up  exam suggested to exclude bowel obstruction.  ABDOMEN - 1 VIEW      12/31/2017 09:48 IMPRESSION No evidence of bowel obstruction or ileus.     IMPRESSION: [name] is a 52 y.o. male with PMH of ESRD on Dialysis, HTN, Hx of CVA without deficits, BPH and Depression that presents to the ED as a code stroke with reports of acute onset Left sided weakness, Left facial droop, decreased sensation on the Left, slightly dysarthric speech. . EMS reports that patient fell out of his bed at approximately 15:40. Patients room mate states that he last saw him normal at 07:00 this morning. CT Head reveals:  Acute right thalamic hemorrhage with mild cytotoxic edema New age indeterminate left thalamic lacunar infarct  Suspected Etiology: HTN hemorrhage and small vessel disease Resultant Symptoms: Left sided weakness, Left facial droop, decreased sensation on Left Stroke Risk Factors: hypertension, ESRD on Dialysis Other Stroke Risk Factors: Advanced age, ?ETOH use, Hx stroke  Outstanding Stroke Work-up Studies:   Work up completed at this time  12/30/2017: Patient had large bowel movement overnight, complaining of abdominal pain this morning.  He will be showed air loops, suggesting adynamic ileus.  GI consult recommended NG tube for  suctioning, n.p.o. with KUB in a.m.  Due to ESRD on HD, no IV fluids per nephrology.  Patient refused NG tube so far, currently no abdominal pain, no nausea vomiting.  Patient stated that he feels okay.  However we will continue n.p.o. except meds and ice chips, with no IV fluid and KUB in a.m.  Neurologically stable.  12/31/2017: Repeat Abdominal Xray shows no bowel obstruction or ileus today. Patient denies any abdominal pain on exam today. Diet advanced and bowel regime in progress. Remains neurologically stable. HD in AM. Awaiting admission decision from outpatient HD facility for transfer to SNF in [place]. Patient and partner updated on POC.   01/01/2018: This  afternoon complains of abdominal pain & Nausea after HD. Abdominal Xray 1/31 - Negative. Diet advanced to clear liquids. Last BM on 1/29. Dulcolax suppository ordered. Will Advance diet to clears for now. Remains neurologically stable. Discharge to SNF in [place] planned for Sunday  PLAN  01/01/2018: Awaiting bed in SNF for discharge - Plan once Medicaid number accepted by HD outpatient facility Frequent Neurochecks  Telemetry Monitoring Continue to HOLD Aspirin as patient has a right ICH For worsening mentation, obtain STAT Head CT- and contact Neurology immediately F/U PT/OT/SLP at SNF Ongoing aggressive stroke risk factor management Patient counseled to be compliant with his antithrombotic medications Patient counseled on Lifestyle modifications including, Diet, Exercise, and Stress Follow up with [facility] in 6 weeks  HX OF STROKES:  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  DYSPHAGIA:  Passed SLP swallow evaluation- Dysphagia 3 diet  Aspiration Precautions in progress  MEDICAL ISSUES:  Possible Bowel Obstruction   - Resolved 12/31/2017 Consulted GI by phone Dr Benson Norway - 1/29 Abdominal Xray 1/30 - resolved. No obstruction or ileus Diet advanced  This afternoon complains of abdominal pain & Nausea after HD Abdominal Xray 1/31 - Negative. Diet advanced to clear liquids  Constipation Last BM documented on 1/29 Miralax and Senna in progress Dulcolax suppository today  ESRD on Dialysis - HD  (Saturday)  Stable, Nephrology following, Appreciate Assistance- HD in AM  HD today  Creatinine - 6.26  Anemia Chronic  Stable, trending up slowly   Iron Studies in progress  Nephrology to start ESA, IV Iron, Aranesp qHD  Repeat labs in AM  Hyperkalemia Stable - HD  - K+4.2 Repeat labs in AM  Hyponatremia  Stable, Nephrology following  Na 129   Hypocalcemia - Secondary Hyperparathyroidism  Stable, Nephrology following   8.9  Elevated Alk  Phos  Stable, Nephrology following  12/17/2017 - 253  Elevated Phosphorus  Stable, Nephrology following 5.0  Leg Spasms  Zanaflex BID PRN  Nausea Zofran PRN Will continue to monitor closely  Diarrhea- Resolved  Imodium PRN  HYPERTENSION:  Stable, some elevated B/P's noted overnight  SBP goal less than 140/90 most of the time  Clonidine dose increased to 0.2 mg BID  Labetolol PRN  Long term BP goal normotensive.  Patient currently on: Norvasc, Hydralazine  HYPERLIPIDEMIA  Home Meds:  NONE  LDL  goal < 70, LDL 73  Continue Lipitor 10 mg  Continue statin at discharge  DIABETES:  HgbA1c goal < 7.0, HgbA1C = 4.0  Currently on:  Novolog  Continue CBG monitoring and SSI to maintain glucose 140-180 mg/dl  DM education   Other Active Problems: Active Problems:   IVH (intraventricular hemorrhage) (HCC)   Dysphagia, post-stroke   Type 2 diabetes mellitus with peripheral neuropathy (HCC)   History of tobacco abuse   Benign essential  HTN   ESRD on dialysis [facility])   History of traumatic brain injury   History of CVA with residual deficit   History of transmetatarsal amputation of foot ([place])   ICH (intracerebral hemorrhage) [facility])    Hospital day # 25 VTE prophylaxis: SCD's  Diet - Fall precautions Diet clear liquid Room service appropriate? Yes; Fluid consistency: Thin   FAMILY UPDATES:  family at bedside  TEAM UPDATES:Mariusz Jubb, MD   Prior Home Stroke Medications:  No antithrombotic  Discharge Stroke Meds:  Please discharge patient on TBD , will likely add ASA prior to discharge  Disposition: [place]:   [id number], Discussion with MSW,                                 Discharge to [facility] now scheduled for Sunday  Follow Up:  Follow-up Information    Garvin Fila, MD. Schedule an appointment as soon as possible for a visit in 6 week(s).   Specialties:  Neurology, Radiology Contact information: [address] [place] [address] Eunice, Onycha, MD -PCP Follow up in 1-2 weeks      Assessment & plan discussed with with attending physician and they are in agreement.    Renie Ora Stroke Neurology Team 01/01/2018 4:34 PM   ATTENDING NOTE: I reviewed above note and agree with the assessment and plan. I have made any additions or clarifications directly to the above note. Pt was seen and examined.   Pt complains of abdominal pain, nausea with dry hive. KUB negative. Kept NPO. zofran PRN. If no improvement overnight, will consider US abdomen or CT abdomen. Pending SNF Sunday.   Rosalin Hawking, MD PhD Stroke Neurology 01/01/2018 11:20 PM     Please page stroke NP  Or  PA  Or MD from 8am -4 pm  as this patient from this time will be  followed by the stroke.   You can look them up on [email or web address]  Password [email or web address]

## 2018-01-01 NOTE — Progress Notes (Signed)
Pt back from dialysis, pt placed back in bed from recliner by unit NTs x 2.  NTs gave pt bath at this time. Pt informed of NP ordered xray of his abdomen and plan for a suppository after xray complete.    Went into pt's room to give pt his few am po meds with sip water, pt began to cough and spit up clear sputum and water.  Did not give meds at this time, will wait to give after pt settles and xray complete. Pt has good bowel sounds in all 4 quads upon nurse assessment.  Spoke with dialysis nurse Tamira, pt did c/o some of his abdomen hurting some while in dialysis, did not require any meds for. Pt drank cranberry juice and took his calcitrol while in dialysis. Pt did not have any cranberry juice in emesis that he spit when back in his room, emesis was all clear.    Dr Roda Shutters rounded shortly after, informed of the above, waiting for xray to be done.

## 2018-01-01 NOTE — Procedures (Signed)
I have personally attended this patient's dialysis session.   No weight obtained 3L goal Requested standing weight post HD as trying to determine new EDW 2K bath BP stable L AVF 400  Camille Bal, MD [facility] [phone] Pager 01/01/2018, 9:38 AM

## 2018-01-01 NOTE — Plan of Care (Signed)
  Education: Knowledge of disease or condition will improve 01/01/2018 0039 - Progressing by Ashley Royalty, RN   Education: Knowledge of secondary prevention will improve 01/01/2018 0039 - Progressing by Ashley Royalty, RN

## 2018-01-01 NOTE — Progress Notes (Signed)
[facility] Progress Note   Subjective:   Seen on HD Says having "spasms" of his abd, hurts a little again  Now plan is for discharge to [facility] on Sunday So will get HD here again on Saturday  Objective Vitals:   12/31/17 1847 12/31/17 2146 01/01/18 0151 01/01/18 0513  BP: 133/60 (!) 141/63 138/66 (!) 144/69  Pulse: 73 76 68 74  Resp: 20 20 18 16   Temp:  97.9 F (36.6 C) 97.8 F (36.6 C) 98.5 F (36.9 C)  TempSrc:  Oral Oral Oral  SpO2: 99% 96% 97% 98%  Weight:      Height:       Physical Exam Sitting in the chair in HD NAD  Pleasant VS as noted Facial droop Lungs clear Normal heart sounds No murmur Abd + BS. Mildly tender, not distended Dense L hemiparesis (some L thumb movement) Dialysis Access: L AVF +bruit cannulated for HD  : Recent Labs  Lab 12/25/17 1156  12/29/17 0502 12/30/17 1225 01/01/18 0814  NA 129*   < > 131* 129* 129*  K 4.3   < > 3.9 4.4 4.2  CL 89*   < > 92* 90* 90*  CO2 26   < > 23 20* 24  GLUCOSE 100*   < > 92 151* 114*  BUN 21*   < > 32* 56* 43*  CREATININE 5.48*   < > 5.67* 8.02* 6.26*  CALCIUM 9.0   < > 8.8* 8.5* 8.9  PHOS 5.2*  --   --  6.0* 5.0*   < > = values in this interval not displayed.    Recent Labs  Lab 12/25/17 1157 12/27/17 0500 12/28/17 0230 12/29/17 0502 12/30/17 1225  WBC 5.7 5.9 5.4 6.0 9.4  HGB 11.5* 10.4* 11.0* 11.0* 12.0*  HCT 35.8* 33.5* 35.5* 35.3* 36.3*  MCV 92.5 94.1 95.4 94.9 91.2  PLT 325 296 291 318 313    Recent Labs  Lab 12/31/17 0609 12/31/17 1215 12/31/17 1626 12/31/17 2028 01/01/18 0606  GLUCAP 113* 182* 118* 109* 113*    Lab Results  Component Value Date   INR 0.93 12/07/2017   INR 0.94 11/02/2016   INR 1.00 04/28/2015   Medications:  .  stroke: mapping our early stages of recovery book   Does not apply Once  . amLODipine  10 mg Oral QHS  . aspirin EC  81 mg Oral Daily  . atorvastatin  10 mg Oral q1800  . calcitRIOL  0.75 mcg Oral Once per day on Tue  Thu Sat  . calcium carbonate  1 tablet Oral Daily  . cloNIDine  0.2 mg Oral BID  . darbepoetin (ARANESP) injection - DIALYSIS  200 mcg Intravenous Q Thu-HD  . feeding supplement (NEPRO CARB STEADY)  237 mL Oral BID BM  . hydrALAZINE  75 mg Oral Q8H  . insulin aspart  0-9 Units Subcutaneous TID WC  . multivitamin  1 tablet Oral QHS  . pantoprazole  40 mg Oral QHS  . polyethylene glycol  17 g Oral BID  . senna-docusate  1 tablet Oral BID  . sevelamer carbonate  800 mg Oral TID WC   Dialysis Orders:  TTS at [facility] 4hr  65kg New EDW will be around 56 kg 2/2bath  AVF  Hep 2000 (NO heparin x 4 wks from bleed/ CVA, 4 wks = Feb 6) - Mircera 06-12-1982 IV q 2 weeks (last 1/3) - Calcitriol 0.46mcg PO q HD  Assessment/Plan:  1. R Thalamic  Hemorrhagic Stroke - dense L HP. L facial droop. Only recovery has been L thumb movement.  2. ESRD-HD TTS.  Per neuro wait 4 wks (Feb 6th) before resuming heparin w HD, wait longer if possible. HD today. 3. Anemiaof CKD-Hgb stable.On IV iron and Aranesp qHD (Tues) 4. MBD of CKD-Continue calcitriol on HD days + binders. Since ileus vs obstr question on previous xrays will change from sevelamer to fosrenol 5. HTN/volume- on norvasc/ hydral/ clonidine, BP's good, 9 kg under prior dry wt  Appears new EDW will be around 56 kg. Has not been weighed since the 26th that I can see. 6. Nutrition- Renal diet, rena-vite,Nepro TID.  7. Type 2 DM - per primary 8. Abdominal pain 1/29 "ileus vs obstruction" on plain films, resolved on 1/31 xrays. I think was just an ileus, but says has "spasms" off and on. Minimally tender. Will use different binder "just in case" (sevelamer not recommended if any issues w/bowel obstruction) 9. Dispo - to SNF in [place], MD [facility] [phone] Pager 01/01/2018, 9:27 AM

## 2018-01-02 ENCOUNTER — Inpatient Hospital Stay (HOSPITAL_COMMUNITY): Payer: Self-pay

## 2018-01-02 DIAGNOSIS — R1011 Right upper quadrant pain: Secondary | ICD-10-CM

## 2018-01-02 LAB — GLUCOSE, CAPILLARY
Glucose-Capillary: 120 mg/dL — ABNORMAL HIGH (ref 65–99)
Glucose-Capillary: 126 mg/dL — ABNORMAL HIGH (ref 65–99)
Glucose-Capillary: 164 mg/dL — ABNORMAL HIGH (ref 65–99)

## 2018-01-02 LAB — BASIC METABOLIC PANEL
Anion gap: 20 — ABNORMAL HIGH (ref 5–15)
BUN: 25 mg/dL — ABNORMAL HIGH (ref 6–20)
CALCIUM: 9.2 mg/dL (ref 8.9–10.3)
CO2: 25 mmol/L (ref 22–32)
Chloride: 91 mmol/L — ABNORMAL LOW (ref 101–111)
Creatinine, Ser: 4.85 mg/dL — ABNORMAL HIGH (ref 0.61–1.24)
GFR, EST AFRICAN AMERICAN: 14 mL/min — AB (ref >60)
GFR, EST NON AFRICAN AMERICAN: 12 mL/min — AB (ref >60)
Glucose, Bld: 120 mg/dL — ABNORMAL HIGH (ref 65–99)
Potassium: 3.4 mmol/L — ABNORMAL LOW (ref 3.5–5.1)
SODIUM: 136 mmol/L (ref 135–145)

## 2018-01-02 LAB — CBC
HCT: 39.7 % (ref 39.0–52.0)
HEMOGLOBIN: 12.9 g/dL — AB (ref 13.0–17.0)
MCH: 29.9 pg (ref 26.0–34.0)
MCHC: 32.5 g/dL (ref 30.0–36.0)
MCV: 92.1 fL (ref 78.0–100.0)
PLATELETS: 261 10*3/uL (ref 150–400)
RBC: 4.31 MIL/uL (ref 4.22–5.81)
RDW: 18 % — AB (ref 11.5–15.5)
WBC: 9.9 10*3/uL (ref 4.0–10.5)

## 2018-01-02 MED ORDER — POTASSIUM CHLORIDE CRYS ER 20 MEQ PO TBCR
40.0000 meq | EXTENDED_RELEASE_TABLET | Freq: Two times a day (BID) | ORAL | Status: DC
  Administered 2018-01-02 – 2018-01-03 (×4): 40 meq via ORAL
  Filled 2018-01-02 (×9): qty 2

## 2018-01-02 MED ORDER — LORAZEPAM 0.5 MG PO TABS
0.5000 mg | ORAL_TABLET | Freq: Once | ORAL | Status: AC
  Administered 2018-01-02: 0.5 mg via ORAL
  Filled 2018-01-02: qty 1

## 2018-01-02 NOTE — Progress Notes (Addendum)
STROKE TEAM DAILY PROGRESS NOTE   SUBJECTIVE No family at bedside today. Pt is found sitting at edge of bed in NAD. +N/V and BM overnight. Fever documented this AM. No complaints of abdominal pain this morning. CT Abdomen/Pelvis pending.   Physical Examination: Vitals:   01/01/18 2125 01/02/18 0119 01/02/18 0524 01/02/18 0859  BP: (!) 174/60 (!) 158/61 (!) 181/76 (!) 123/59  Pulse: (!) 103 99 95 80  Resp: _0 Temp: 98.8 F (37.1 C) 99.5 F (37.5 C) (!) 101.2 F (38.4 C) 98.7 F (37.1 C)  TempSrc: Oral Oral Oral Oral  SpO2: 98% 98% 95% 96%  Weight:      Height:       HEENT-  Normocephalic,  Cardiovascular - Regular rate and rhythm, + murmur Respiratory - Lungs clear bilaterally. Non-labored breathing, No wheezing. Abdomen - soft and tender, BS normal, complaining of pain with palpation Extremities- no edema or cyanosis Skin-warm and dry  Neurological Examination Mental Status: Awake,oriented x 3  Speech is slightly dysarthric. Able to follow 3 step commands without difficulty. Cranial Nerves: II: Left field cut. Pupils are equal, round, and reactive to light.   III,IV, VI: EOMI without ptosis or diploplia. No nystagmus.   V: Facial sensation is decreased on the Left VII: Facial movement is decreased on the Left VIII: hearing is intact to voice X: Uvula elevates symmetrically XI: Shoulder shrug is decreased on the left XII: tongue is midline without atrophy or fasciculations.  Motor: Tone is normal. Bulk is normal. 5/5 strength on the Right.  Dense left hemiplegia and Strength is 1/5 on the Left leg and 0-1/5 in left  arm   Sensor: Sensation is decreased on the Left in the arms and legs. Normal on the Right Deep Tendon Reflexes: 1+ and symmetric throughout in the biceps and patellae Plantars: Toes are downgoing bilaterally.  Cerebellar: unable to perform finger-to-nose on Left Gait: Truncal ataxia to Left   Lab Results: CBC: Recent Labs   Lab 12/30/17 1225 01/01/18 0814 01/02/18 0402  WBC 9.4 11.9* 9.9  HGB 12.0* 11.7* 12.9*  HCT 36.3* 36.0* 39.7  MCV 91.2 91.4 92.1  PLT 313 307 364   Basic Metabolic Panel: Recent Labs  Lab 12/28/17 0230 12/29/17 0502 12/30/17 1225 01/01/18 0814 01/02/18 0402  NA 133* 131* 129* 129* 136  K 3.7 3.9 4.4 4.2 3.4*  CL 94* 92* 90* 90* 91*  CO2 27 23 20* 24 25  GLUCOSE 130* 92 151* 114* 120*  BUN 16 32* 56* 43* 25*  CREATININE 3.81* 5.67* 8.02* 6.26* 4.85*  CALCIUM 9.3 8.8* 8.5* 8.9 9.2  PHOS  --   --  6.0* 5.0*  --    Liver Function Tests: Recent Labs  Lab 12/30/17 1225 01/01/18 0814  ALBUMIN 3.5 3.0*   Imaging:  Ct Head Code Stroke Wo Contrast Result Date: 12/07/2017 IMPRESSION:  1. Acute right thalamic hemorrhage. Mild edema and local mass effect.  2. 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  3. New age indeterminate left thalamic lacunar infarct.   Ct Head Wo Contrast 12/27/2017  IMPRESSION: Recent right thalamic intraparenchymal hematoma is now isodense to edematous brain.  No evidence of additional bleeding. Old hemorrhagic infarction in the right basal ganglia. Resolution of intraventricular blood.  Ventricular size stable.  DG Chest Port 1 View 12/26/2017 IMPRESSION: No active disease.  Ct Angio Head W Or Wo Contrast Result Date: 12/07/2017 IMPRESSION: 1. No aneurysm, vascular malformation, or  spot sign associated with the right thalamic hemorrhage. 2. Patent circle of Willis with intracranial atherosclerosis as above.  Moderate to severe right PCA stenoses.   Ct Head Wo Contrast Result Date: 12/08/2017 IMPRESSION:  1. Stable right thalamic hematoma and small volume of hemorrhage in right lateral ventricle.  2. Mild increase of mass effect with 5 mm right-to-left midline shift, previously 4 mm.  3. No new acute intracranial abnormality identified.   ECHO  12/12/2017 Study Conclusions - Left ventricle:  The cavity size was normal. There was moderate   concentric hypertrophy. Systolic function was normal. The   estimated ejection fraction was in the range of 55% to 60%. Wall   motion was normal; there were no regional wall motion   abnormalities. There was a reduced contribution of atrial   contraction to ventricular filling, due to increased ventricular   diastolic pressure or atrial contractile dysfunction. Doppler   parameters are consistent with a reversible restrictive pattern,   indicative of decreased left ventricular diastolic compliance   and/or increased left atrial pressure (grade 3 diastolic   dysfunction). Doppler parameters are consistent with high   ventricular filling pressure. - Aortic valve: Trileaflet; mildly thickened, mildly calcified   leaflets. Moderate focal calcification involving the left   coronary cusp. - Aorta: Ascending aorta diameter: 40 mm (ED). - Ascending aorta: The ascending aorta was mildly dilated. - Mitral valve: Calcified annulus. There was mild regurgitation. - Left atrium: The atrium was mildly dilated. - Right ventricle: The cavity size was mildly dilated. Wall   thickness was normal. - Tricuspid valve: There was trivial regurgitation  Carotid Duplex           12/11/2017 Final Interpretation: Right Carotid:  The extracranial vessels were near-normal with only minimal wall thickening or plaque. Left Carotid: The extracranial vessels were near-normal with only minimal wall thickening or plaque. Vertebrals: Left vertebral artery was patent with antegrade flow.  Right vertebral artery was not visualized. Subclavians: Normal flow hemodynamics were seen in the right subclavian artery.  Left subclavian artery exhibits monophasic flow, suggestive of possbile proximal obstruction  ABDOMEN - 1 VIEW      12/30/2017 11:34 IMPRESSION: Air-filled loops of small and large bowel noted suggesting adynamic ileus. Follow-up exam suggested to exclude bowel  obstruction.  ABDOMEN - 1 VIEW      12/31/2017 09:48 IMPRESSION No evidence of bowel obstruction or ileus.  PORTABLE CHEST 1 VIEW 01/02/2018 08:42 IMPRESSION: No evidence of acute cardiopulmonary disease  CT Abdomen/Pelvis 01/02/2018 PENDING    IMPRESSION: [name] is a 52 y.o. male with PMH of ESRD on Dialysis, HTN, Hx of CVA without deficits, BPH and Depression that presents to the ED as a code stroke with reports of acute onset Left sided weakness, Left facial droop, decreased sensation on the Left, slightly dysarthric speech. . EMS reports that patient fell out of his bed at approximately 15:40. Patients room mate states that he last saw him normal at 07:00 this morning. CT Head reveals:  Acute right thalamic hemorrhage with mild cytotoxic edema New age indeterminate left thalamic lacunar infarct  Suspected Etiology: HTN hemorrhage and small vessel disease Resultant Symptoms: Left sided weakness, Left facial droop, decreased sensation on Left Stroke Risk Factors: hypertension, ESRD on Dialysis Other Stroke Risk Factors: Advanced age, ?ETOH use, Hx stroke  Outstanding Stroke Work-up Studies:   Work up completed at this time  12/30/2017: Patient had large bowel movement overnight, complaining of abdominal pain this morning.  He  will be showed air loops, suggesting adynamic ileus.  GI consult recommended NG tube for suctioning, n.p.o. with KUB in a.m.  Due to ESRD on HD, no IV fluids per nephrology.  Patient refused NG tube so far, currently no abdominal pain, no nausea vomiting.  Patient stated that he feels okay.  However we will continue n.p.o. except meds and ice chips, with no IV fluid and KUB in a.m.  Neurologically stable.  12/31/2017: Repeat Abdominal Xray shows no bowel obstruction or ileus today. Patient denies any abdominal pain on exam today. Diet advanced and bowel regime in progress. Remains neurologically stable. HD in AM. Awaiting admission decision from  outpatient HD facility for transfer to SNF in [place]. Patient and partner updated on POC.   01/01/2018: This afternoon complains of abdominal pain & Nausea after HD. Abdominal Xray 1/31 - Negative. Diet advanced to clear liquids. Last BM on 1/29. Dulcolax suppository ordered. Will Advance diet to clears for now. Remains neurologically stable. Discharge to SNF in [place] planned for Sunday  01/02/2018: No complaint of abdominal pain today. +vomiting and nausea overnight. +BM after Dulcolax suppository. TMax 101.2. Will obtain CT Abdomen/Pelvis today. Bl Cx pending. Continue with clear liquid diet for now. Will continue to monitor closely  PLAN  01/02/2018: Awaiting bed in SNF for discharge - Plan once Medicaid number accepted by HD outpatient facility Frequent Neurochecks  Telemetry Monitoring Continue to HOLD Aspirin as patient has a right ICH For worsening mentation, obtain STAT Head CT- and contact Neurology immediately F/U PT/OT/SLP at SNF Ongoing aggressive stroke risk factor management Patient counseled to be compliant with his antithrombotic medications Patient counseled on Lifestyle modifications including, Diet, Exercise, and Stress Follow up with [facility] in 6 weeks  HX OF STROKES:  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 No obstruction or ileus No abdominal pain & Nausea today, +BM last PM  Abdominal Xray 1/31 - Negative. Diet advanced to clear liquids  Constipation +BM documented last PM after Dulcolax suppository Miralax and Senna in progress  ESRD on Dialysis - HD   (Saturday)  Stable, Nephrology following, Appreciate Assistance- HD in AM  HD today  Creatinine - 4.85  Anemia Chronic  Stable, trending up slowly   Iron Studies in progress  Nephrology to start ESA, IV Iron, Aranesp qHD  Repeat labs in AM  Hyperkalemia Stable - HD  - K+3.4 Repeat labs in AM  Hyponatremia  Stable, Nephrology following  Na 129   Hypocalcemia - Secondary Hyperparathyroidism  Stable, Nephrology following   9.2  Elevated Alk Phos  Stable, Nephrology following  12/17/2017 - 253  Elevated Phosphorus  Stable, Nephrology following 5.0  Leg Spasms  Zanaflex BID PRN  Nausea Zofran PRN Will continue to monitor closely  Diarrhea- Resolved  Imodium PRN  HYPERTENSION:  Stable, some elevated B/P's noted overnight  SBP goal less than 140/90 most of the time  Clonidine dose increased to 0.2 mg BID  Labetolol PRN  Long term BP goal normotensive.  Patient currently on: Norvasc, Hydralazine  HYPERLIPIDEMIA  Home Meds:  NONE  LDL  goal < 70, LDL 73  Continue Lipitor  10 mg  Continue statin at discharge  DIABETES:  HgbA1c goal < 7.0, HgbA1C = 4.0  Currently on:  Novolog  Continue CBG monitoring and SSI to maintain glucose 140-180 mg/dl  DM education   Other Active Problems: Active Problems:   IVH (intraventricular hemorrhage) (HCC)   Dysphagia, post-stroke   Type 2 diabetes mellitus with peripheral neuropathy (HCC)   History of tobacco abuse   Benign essential HTN   ESRD on dialysis (HCC)   History of traumatic brain injury   History of CVA with residual deficit   History of transmetatarsal amputation of foot (HCC)   ICH (intracerebral hemorrhage) ([place])    Hospital day # 26 VTE prophylaxis: SCD's  Diet - Fall precautions Diet NPO time specified Except for: [employer], Sips with Meds   FAMILY UPDATES:  family at bedside  TEAM UPDATES:Tandy Lewin, MD   Prior Home Stroke Medications:  No antithrombotic  Discharge  Stroke Meds:  Please discharge patient on TBD , will likely add ASA prior to discharge  Disposition: [place]:   [id number], Discussion with MSW,                                 Discharge to [facility] now scheduled for Sunday  Follow Up:  Follow-up Information    Garvin Fila, MD. Schedule an appointment as soon as possible for a visit in 6 week(s).   Specialties:  Neurology, Radiology Contact information: [address] [place] [address] [place], [place], [place] -PCP Follow up in 1-2 weeks      Assessment & plan discussed with with attending physician and they are in agreement.    Renie Ora Stroke Neurology Team 01/02/2018 12:42 PM   ATTENDING NOTE: I reviewed above note and agree with the assessment and plan. I have made any additions or clarifications directly to the above note. Pt was seen and examined.   Pt abdominal pain, nausea have been resolved. Eager to eat. CT abdomen showed gallstones with slight haziness in the pericholecystic fat. KUB negative yesterday. Pt had bowel movement yesterday after suppository. Had one spiking fever with 101.2 but WBC normalized today. Will start pt on clear liquid diet. HD today. Pending SNF Sunday.   Rosalin Hawking, MD PhD Stroke Neurology 01/02/2018 6:16 PM   Please page stroke NP  Or  PA  Or MD from 8am -4 pm  as this patient from this time will be  followed by the stroke.   You can look them up on [email or web address]  Password [email or web address]

## 2018-01-02 NOTE — Progress Notes (Signed)
[facility] Progress Note   Subjective:   Says he feels much better today Abd films repeated yesterday and looked OK  Plan is for discharge to [facility] on Sunday So will get HD here again on Saturday  Objective Vitals:   01/01/18 2125 01/02/18 0119 01/02/18 0524 01/02/18 0859  BP: (!) 174/60 (!) 158/61 (!) 181/76 (!) 123/59  Pulse: (!) 103 99 95 80  Resp: 20 18 18 20   Temp: 98.8 F (37.1 C) 99.5 F (37.5 C) (!) 101.2 F (38.4 C) 98.7 F (37.1 C)  TempSrc: Oral Oral Oral Oral  SpO2: 98% 98% 95% 96%  Weight:      Height:       Physical Exam Sitting in the chair with his stuffed animals Happy NAD  VS as noted Facial droop Lungs clear Normal heart sounds No murmur Abd + BS. Mildly tender, not distended Dense L hemiparesis (some L thumb movement) Foley yellow urine (300-550/day) Dialysis Access: L AVF +bruit   : Recent Labs  Lab 12/30/17 1225 01/01/18 0814 01/02/18 0402  NA 129* 129* 136  K 4.4 4.2 3.4*  CL 90* 90* 91*  CO2 20* 24 25  GLUCOSE 151* 114* 120*  BUN 56* 43* 25*  CREATININE 8.02* 6.26* 4.85*  CALCIUM 8.5* 8.9 9.2  PHOS 6.0* 5.0*  --     Recent Labs  Lab 12/28/17 0230 12/29/17 0502 12/30/17 1225 01/01/18 0814 01/02/18 0402  WBC 5.4 6.0 9.4 11.9* 9.9  HGB 11.0* 11.0* 12.0* 11.7* 12.9*  HCT 35.5* 35.3* 36.3* 36.0* 39.7  MCV 95.4 94.9 91.2 91.4 92.1  PLT 291 318 313 307 261    Recent Labs  Lab 01/01/18 0606 01/01/18 1245 01/01/18 1629 01/01/18 2102 01/02/18 0605  GLUCAP 113* 116* 154* 99 120*    Medications:  .  stroke: mapping our early stages of recovery book   Does not apply Once  . amLODipine  10 mg Oral QHS  . aspirin EC  81 mg Oral Daily  . atorvastatin  10 mg Oral q1800  . calcitRIOL  0.75 mcg Oral Once per day on Tue Thu Sat  . calcium carbonate  1 tablet Oral Daily  . cloNIDine  0.2 mg Oral BID  . feeding supplement (NEPRO CARB STEADY)  237 mL Oral BID BM  . hydrALAZINE  75 mg Oral Q8H  .  insulin aspart  0-9 Units Subcutaneous TID WC  . lanthanum  1,000 mg Oral TID WC  . multivitamin  1 tablet Oral QHS  . pantoprazole  40 mg Oral QHS  . polyethylene glycol  17 g Oral BID  . potassium chloride  40 mEq Oral BID  . senna-docusate  1 tablet Oral BID   Dialysis Orders:  TTS at [facility] 4hr  65kg New EDW at discharge 2/2bath  AVF  Hep 2000 (NO heparin x 4 wks from bleed/ CVA, 4 wks = Feb 6) - Mircera 06-12-1982 IV q 2 weeks (last 1/3) - Calcitriol 0.13mcg PO q HD  Assessment/Plan:  1. 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 L facial droop. Only recovery has been L thumb movement.  2. ESRD-HD TTS.  Per neuro wait 4 wks (Feb 6th) before resuming heparin w HD, wait longer if possible. HD today. 3. Anemiaof CKD-Hgb stable.On IV iron and 04-12-1983 Aranesp qHD (Tues) - holding Aranesp at this time 4. MBD of CKD-Continue calcitriol on HD days + binders. Since ileus vs obstr question on previous xrays changed from sevelamer to fosrenol  5. HTN/volume- on norvasc/ hydral/ clonidine, BP's good. Initially thought new EDW to be around 56 kg. Weight yesterday of 67 highly suspect since nearly 10 kg higher than past week's worth of weights. Will try to get a good standing weight pre and post treatment tomorrow. Has no edema and clear lungs at this time 6. Nutrition- Renal diet, rena-vite,Nepro TID.  7. Type 2 DM - per primary 8. Abdominal pain 1/29 "ileus vs obstruction" on plain films, resolved on 1/31 xrays. I think was just ileus, but still symptomatic yesterday. Using different binder "just in case" (sevelamer not recommended if any issues w/bowel obstruction). I see CT ordered for today. Still NPO 9. Dispo - to SNF in [place] on Sunday barring any further medical complications  Camille Bal, MD [facility] [phone] Pager 01/02/2018, 10:50 AM

## 2018-01-02 NOTE — Progress Notes (Signed)
CSW following for discharge planning. CSW confirmed transportation payment through CSW Director and scheduled for 1:00 PM on Sunday.  CSW confirmed with facility that patient can admit on Sunday. CSW will continue to follow.  Blenda Nicely, [place] Clinical Social Worker [phone]

## 2018-01-02 NOTE — Progress Notes (Signed)
Physical Therapy Treatment Patient Details Name: Wesley Burns MRN: [id number] DOB: Nov 09, 1966 Today's Date: 01/02/2018    History of Present Illness 52 y.o. male with ESRD, Type 2 DM, HTN, GERD, and Hx of Right basal ganglia CVA (2017), right transmet amputation. Admitted with right thalamic hemorrhagic stroke.    PT Comments    Pt continues to make progress towards his goals, increasing his strength and endurance. Pt modA for bed mobility and modAx2 for sit to stand. Pt stood for total of 15 minutes while working on quad and glute activation for maintaining standing in [place]. Pt will continue to follow acutely to progress strength, endurance and standing balance.     Follow Up Recommendations  Supervision/Assistance - 24 hour;SNF     Equipment Recommendations  Other (comment)    Recommendations for Other Services Rehab consult     Precautions / Restrictions Precautions Precautions: Fall(L hemi) Precaution Comments: R trans met, L toe amputations Restrictions Weight Bearing Restrictions: No    Mobility  Bed Mobility Overal bed mobility: Needs Assistance Bed Mobility: Supine to Sit     Supine to sit: Mod assist     General bed mobility comments: modA for trunk elevation due to inability to use L UE functionally  Transfers Overall transfer level: Needs assistance   Transfers: Sit to/from Stand Sit to Stand: Mod assist;+2 physical assistance         General transfer comment: knees blocked by Charlaine Dalton, pt used R UE and LE to push up, with v/c's pt able to contract buttocks and retract shoulders to achieve full upright posture, with verbal and tactile cuing able to engage and hold quad contraction enough to bring knee away from [place] and stand with minAx2, pt with improved eccentric lowering to [place], Pt stood for a total of 15 minutes with 3 seated rest breaks     Modified Rankin (Stroke Patients Only) Modified Rankin (Stroke Patients Only) Pre-Morbid Rankin Score:  No symptoms Modified Rankin: Severe disability     Balance Overall balance assessment: Needs assistance Sitting-balance support: Feet supported;No upper extremity supported Sitting balance-Leahy Scale: Fair Sitting balance - Comments: pt able to maintain midline while sitting EOB x 5 min without assist   Standing balance support: Bilateral upper extremity supported Standing balance-Leahy Scale: Zero Standing balance comment: requires assist                            Cognition Arousal/Alertness: Awake/alert Behavior During Therapy: WFL for tasks assessed/performed Overall Cognitive Status: Within Functional Limits for tasks assessed                                               Pertinent Vitals/Pain Pain Assessment: Faces Faces Pain Scale: Hurts little more Pain Location: stomach  Pain Descriptors / Indicators: Aching;Cramping Pain Intervention(s): Limited activity within patient's tolerance;Monitored during session;Repositioned           PT Goals (current goals can now be found in the care plan section) Acute Rehab PT Goals PT Goal Formulation: With patient Time For Goal Achievement: 01/05/18 Potential to Achieve Goals: Good    Frequency    Min 3X/week      PT Plan Current plan remains appropriate       AM-PAC PT "6 Clicks" Daily Activity  Outcome Measure  Difficulty turning over in bed (  including adjusting bedclothes, sheets and blankets)?: Unable Difficulty moving from lying on back to sitting on the side of the bed? : Unable Difficulty sitting down on and standing up from a chair with arms (e.g., wheelchair, bedside commode, etc,.)?: Unable Help needed moving to and from a bed to chair (including a wheelchair)?: A Lot Help needed walking in hospital room?: Total Help needed climbing 3-5 steps with a railing? : Total 6 Click Score: 7    End of Session Equipment Utilized During Treatment: Gait belt Activity Tolerance:  Patient tolerated treatment well Patient left: in chair;with chair alarm set;with call bell/phone within reach Nurse Communication: Mobility status PT Visit Diagnosis: Unsteadiness on feet (R26.81);Other abnormalities of gait and mobility (R26.89);Hemiplegia and hemiparesis Hemiplegia - Right/Left: Left Hemiplegia - dominant/non-dominant: Non-dominant Hemiplegia - caused by: Other Nontraumatic intracranial hemorrhage     Time: 1324-4010 PT Time Calculation (min) (ACUTE ONLY): 43 min  Charges:  $Therapeutic Exercise: 23-37 mins $Therapeutic Activity: 8-22 mins                    G Codes:       Kaushik Maul B. Migdalia Dk PT, DPT Acute Rehabilitation  [phone] Pager [phone]     [place] 01/02/2018, 12:58 PM

## 2018-01-02 DEATH — deceased

## 2018-01-03 DIAGNOSIS — A419 Sepsis, unspecified organism: Secondary | ICD-10-CM

## 2018-01-03 DIAGNOSIS — R1084 Generalized abdominal pain: Secondary | ICD-10-CM

## 2018-01-03 DIAGNOSIS — I959 Hypotension, unspecified: Secondary | ICD-10-CM

## 2018-01-03 DIAGNOSIS — K851 Biliary acute pancreatitis without necrosis or infection: Secondary | ICD-10-CM

## 2018-01-03 DIAGNOSIS — R109 Unspecified abdominal pain: Secondary | ICD-10-CM

## 2018-01-03 DIAGNOSIS — R6521 Severe sepsis with septic shock: Secondary | ICD-10-CM

## 2018-01-03 DIAGNOSIS — B9620 Unspecified Escherichia coli [E. coli] as the cause of diseases classified elsewhere: Secondary | ICD-10-CM | POA: Diagnosis present

## 2018-01-03 DIAGNOSIS — R7881 Bacteremia: Secondary | ICD-10-CM

## 2018-01-03 DIAGNOSIS — K8010 Calculus of gallbladder with chronic cholecystitis without obstruction: Secondary | ICD-10-CM | POA: Diagnosis present

## 2018-01-03 LAB — URINALYSIS, ROUTINE W REFLEX MICROSCOPIC
BILIRUBIN URINE: NEGATIVE
GLUCOSE, UA: 150 mg/dL — AB
Hgb urine dipstick: NEGATIVE
KETONES UR: 5 mg/dL — AB
Leukocytes, UA: NEGATIVE
Nitrite: NEGATIVE
Specific Gravity, Urine: 1.016 (ref 1.005–1.030)
pH: 9 — ABNORMAL HIGH (ref 5.0–8.0)

## 2018-01-03 LAB — BASIC METABOLIC PANEL
Anion gap: 15 (ref 5–15)
BUN: 46 mg/dL — ABNORMAL HIGH (ref 6–20)
CALCIUM: 8.7 mg/dL — AB (ref 8.9–10.3)
CO2: 24 mmol/L (ref 22–32)
CREATININE: 6.22 mg/dL — AB (ref 0.61–1.24)
Chloride: 91 mmol/L — ABNORMAL LOW (ref 101–111)
GFR calc Af Amer: 11 mL/min — ABNORMAL LOW (ref >60)
GFR calc non Af Amer: 9 mL/min — ABNORMAL LOW (ref >60)
Glucose, Bld: 93 mg/dL (ref 65–99)
Potassium: 4.5 mmol/L (ref 3.5–5.1)
Sodium: 130 mmol/L — ABNORMAL LOW (ref 135–145)

## 2018-01-03 LAB — GLUCOSE, CAPILLARY
GLUCOSE-CAPILLARY: 104 mg/dL — AB (ref 65–99)
GLUCOSE-CAPILLARY: 144 mg/dL — AB (ref 65–99)
GLUCOSE-CAPILLARY: 128 mg/dL — AB (ref 65–99)
Glucose-Capillary: 120 mg/dL — ABNORMAL HIGH (ref 65–99)
Glucose-Capillary: 99 mg/dL (ref 65–99)

## 2018-01-03 LAB — CBC
HEMATOCRIT: 35 % — AB (ref 39.0–52.0)
Hemoglobin: 11.3 g/dL — ABNORMAL LOW (ref 13.0–17.0)
MCH: 29 pg (ref 26.0–34.0)
MCHC: 32.3 g/dL (ref 30.0–36.0)
MCV: 90 fL (ref 78.0–100.0)
Platelets: 268 10*3/uL (ref 150–400)
RBC: 3.89 MIL/uL — ABNORMAL LOW (ref 4.22–5.81)
RDW: 18.4 % — AB (ref 11.5–15.5)
WBC: 7.9 10*3/uL (ref 4.0–10.5)

## 2018-01-03 LAB — BLOOD CULTURE ID PANEL (REFLEXED)
Acinetobacter baumannii: NOT DETECTED
CANDIDA PARAPSILOSIS: NOT DETECTED
CANDIDA TROPICALIS: NOT DETECTED
CARBAPENEM RESISTANCE: NOT DETECTED
Candida albicans: NOT DETECTED
Candida glabrata: NOT DETECTED
Candida krusei: NOT DETECTED
ENTEROBACTERIACEAE SPECIES: DETECTED — AB
Enterobacter cloacae complex: NOT DETECTED
Enterococcus species: NOT DETECTED
Escherichia coli: DETECTED — AB
Haemophilus influenzae: NOT DETECTED
KLEBSIELLA PNEUMONIAE: NOT DETECTED
Klebsiella oxytoca: NOT DETECTED
Listeria monocytogenes: NOT DETECTED
Neisseria meningitidis: NOT DETECTED
PROTEUS SPECIES: NOT DETECTED
Pseudomonas aeruginosa: NOT DETECTED
STAPHYLOCOCCUS AUREUS BCID: NOT DETECTED
STAPHYLOCOCCUS SPECIES: NOT DETECTED
STREPTOCOCCUS SPECIES: NOT DETECTED
Serratia marcescens: NOT DETECTED
Streptococcus agalactiae: NOT DETECTED
Streptococcus pneumoniae: NOT DETECTED
Streptococcus pyogenes: NOT DETECTED

## 2018-01-03 LAB — HEPATIC FUNCTION PANEL
ALT: 60 U/L (ref 17–63)
AST: 79 U/L — AB (ref 15–41)
Albumin: 2.9 g/dL — ABNORMAL LOW (ref 3.5–5.0)
Alkaline Phosphatase: 547 U/L — ABNORMAL HIGH (ref 38–126)
BILIRUBIN DIRECT: 2 mg/dL — AB (ref 0.1–0.5)
BILIRUBIN TOTAL: 3.1 mg/dL — AB (ref 0.3–1.2)
Indirect Bilirubin: 1.1 mg/dL — ABNORMAL HIGH (ref 0.3–0.9)
Total Protein: 6.7 g/dL (ref 6.5–8.1)

## 2018-01-03 LAB — LACTATE DEHYDROGENASE: LDH: 103 U/L (ref 98–192)

## 2018-01-03 LAB — PROCALCITONIN: Procalcitonin: 26.67 ng/mL

## 2018-01-03 LAB — MRSA PCR SCREENING: MRSA by PCR: NEGATIVE

## 2018-01-03 LAB — SEDIMENTATION RATE: SED RATE: 30 mm/h — AB (ref 0–16)

## 2018-01-03 LAB — LACTIC ACID, PLASMA: LACTIC ACID, VENOUS: 1.8 mmol/L (ref 0.5–1.9)

## 2018-01-03 LAB — LIPASE, BLOOD: LIPASE: 1663 U/L — AB (ref 11–51)

## 2018-01-03 MED ORDER — METRONIDAZOLE IN NACL 5-0.79 MG/ML-% IV SOLN
500.0000 mg | Freq: Three times a day (TID) | INTRAVENOUS | Status: DC
  Administered 2018-01-03: 500 mg via INTRAVENOUS
  Filled 2018-01-03 (×3): qty 100

## 2018-01-03 MED ORDER — SODIUM CHLORIDE 0.9 % IV BOLUS (SEPSIS)
500.0000 mL | Freq: Once | INTRAVENOUS | Status: AC
Start: 2018-01-03 — End: 2018-01-03
  Administered 2018-01-03: 500 mL via INTRAVENOUS

## 2018-01-03 MED ORDER — ONDANSETRON HCL 4 MG/2ML IJ SOLN
4.0000 mg | Freq: Four times a day (QID) | INTRAMUSCULAR | Status: DC | PRN
  Administered 2018-01-03 – 2018-01-10 (×5): 4 mg via INTRAVENOUS
  Filled 2018-01-03 (×7): qty 2

## 2018-01-03 MED ORDER — PROMETHAZINE HCL 25 MG/ML IJ SOLN: 12.5000 mg | Freq: Four times a day (QID) | INTRAMUSCULAR | Status: DC | PRN

## 2018-01-03 MED ORDER — SODIUM CHLORIDE 0.9 % IV SOLN
3.0000 g | Freq: Two times a day (BID) | INTRAVENOUS | Status: DC
  Administered 2018-01-03: 3 g via INTRAVENOUS
  Filled 2018-01-03 (×2): qty 3

## 2018-01-03 MED ORDER — DEXTROSE 5 % IV SOLN
2.0000 g | Freq: Every day | INTRAVENOUS | Status: DC
  Administered 2018-01-03: 2 g via INTRAVENOUS
  Filled 2018-01-03: qty 2

## 2018-01-03 MED ORDER — SODIUM CHLORIDE 0.9 % IV SOLN
500.0000 mg | INTRAVENOUS | Status: DC
  Administered 2018-01-03 – 2018-01-04 (×2): 500 mg via INTRAVENOUS
  Filled 2018-01-03 (×3): qty 0.5

## 2018-01-03 MED ORDER — INSULIN ASPART 100 UNIT/ML ~~LOC~~ SOLN
0.0000 [IU] | SUBCUTANEOUS | Status: DC
  Administered 2018-01-03 – 2018-01-06 (×6): 1 [IU] via SUBCUTANEOUS
  Administered 2018-01-07 – 2018-01-08 (×3): 2 [IU] via SUBCUTANEOUS
  Administered 2018-01-08: 1 [IU] via SUBCUTANEOUS
  Administered 2018-01-09: 3 [IU] via SUBCUTANEOUS
  Administered 2018-01-09: 2 [IU] via SUBCUTANEOUS
  Administered 2018-01-10: 3 [IU] via SUBCUTANEOUS
  Administered 2018-01-10: 2 [IU] via SUBCUTANEOUS

## 2018-01-03 MED ORDER — CALCITRIOL 0.5 MCG PO CAPS
ORAL_CAPSULE | ORAL | Status: AC
  Filled 2018-01-03: qty 1

## 2018-01-03 MED ORDER — CALCITRIOL 0.25 MCG PO CAPS
ORAL_CAPSULE | ORAL | Status: AC
  Filled 2018-01-03: qty 1

## 2018-01-03 MED ORDER — SODIUM CHLORIDE 0.9 % IV SOLN: 3.0000 g | Freq: Four times a day (QID) | INTRAVENOUS | Status: DC

## 2018-01-03 MED ORDER — VANCOMYCIN HCL 10 G IV SOLR
1500.0000 mg | Freq: Once | INTRAVENOUS | Status: AC
  Administered 2018-01-03: 1500 mg via INTRAVENOUS
  Filled 2018-01-03: qty 1500

## 2018-01-03 NOTE — Progress Notes (Signed)
Pt came back from dialysis A&O x4, responsive and RN gave pt his missing am medications which he missed d/t dialysis. VSS, new antibiotics was started when pt RN walked out of room. As RN got RN station pt called to the station stating he was not feeling good; RN went back to room to check on pt when he stated "I don't feel right, my heart feels like its racing". RN called charge RN to room; pt started coughing with he head turned to the left, eyes wide opened, breathing but not responding to voices or touch, Rapid called; Dr. Roda Shutters and NP Revonda Standard called and notified of changes. Rapid response RN and Dr. Roda Shutters along with PA at pt's bedside. Pt pleased on 4L oxygen, telemetry applied and fluids started. Pt antibiotics stopped as ordered by MD. Dionne Bucy RN
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
## 2018-01-03 NOTE — Procedures (Signed)
I have personally attended this patient's dialysis session.   L AVF 350 No cannulation issues.  2 liter goal 2K bath for K 4.5 Can't seem to get accurate weights - trying for accurate post bed weight + BC for 2 GNR yesterday. ? Gallbladder vs urinary source  Camille Bal, MD [facility] [phone] Pager 01/03/2018, 9:51 AM

## 2018-01-03 NOTE — Progress Notes (Addendum)
Pharmacy Antibiotic Note  Wesley Burns is a 52 y.o. male admitted on 12/07/2017 with E. Coli bacteremia. Pharmacy has been consulted for vancomycin dosing for broad coverage due to low blood pressure on HD. Patient has also been broadened to meropenem. Patient is ESRD on HD TTS. WBC improved today and patient afebrile. Patient was just started on therapy for E. Coli bacteremia today.  Plan: Vancomycin 1500 mg X 1  F/U HD for further doses  Would consider discontinuation if no further growth on cultures   Height: 5\' 11"  (180.3 cm) Weight: 139 lb 15.9 oz (63.5 kg) IBW/kg (Calculated) : 75.3  Temp (24hrs), Avg:98.3 F (36.8 C), Min:97.8 F (36.6 C), Max:99.4 F (37.4 C)  Recent Labs  Lab 12/29/17 0502 12/30/17 1225 01/01/18 0814 01/02/18 0402 01/03/18 0547  WBC 6.0 9.4 11.9* 9.9 7.9  CREATININE 5.67* 8.02* 6.26* 4.85* 6.22*    Estimated Creatinine Clearance: 12.3 mL/min (A) (by C-G formula based on SCr of 6.22 mg/dL (H)).    Allergies  Allergen Reactions  . Prednisone Hives, Nausea And Vomiting and Swelling    Joint swelling, also  . Adhesive [Tape] Rash    Antimicrobials this admission: 2/02 CTX>>2/2 2/2 Merrem>> 2/2 Vanc >>    Microbiology results: 2/1  BCx: E. Coli    Thank you for allowing pharmacy to be a part of this patient's care.  4/02, PharmD, BCPS PGY2 Infectious Diseases Pharmacy Resident Pager: [phone]  01/03/2018 3:43 PM

## 2018-01-03 NOTE — Progress Notes (Signed)
Pt came back from dialysis alert and verbally responsive. Pt in room with call light within reach. Dionne Bucy RN

## 2018-01-03 NOTE — Progress Notes (Addendum)
RN paged regarding new mental status changes in patient.  Patient now unresponsive with prominent gaze to the left. This occurred after he was given hydralazine and ? clonidine post HD, Also given Unasyn and became "flushed" but no rash  Neurology team has been notified; according to nurse they are at the bedside.  Patient's blood pressure has also dropped into the 90s.  I asked the RN to notify the nephrology team; I suspect the patient may require fluid challenge post volume removal after dialysis.  We will continue to monitor patient.  Of note urinalysis unremarkable and not consistent with UTI.  1528: Updated by charge RN- pt alert but SBP still in 60s- Neuro at bedside- 250 cc bolus ordered and page placed to PCCM. Discussed w/ attending and will add sepsis focused labs, LDH (to calculate [place] Criteria) and another 500 cc NS bolus. Arrived to bedside.Discussed w/ PCCM/Ishmail at bedside- likely combo volume depletion w/ co-administration of anti HTN meds and possible evolving sepsis physiology- plan is to tx to ICU.  Junious Silk, ANP

## 2018-01-03 NOTE — Progress Notes (Signed)
PHARMACY - PHYSICIAN COMMUNICATION CRITICAL VALUE ALERT - BLOOD CULTURE IDENTIFICATION (BCID)  Wesley Burns is an 52 y.o. male  with a chief complaint of  Stroke, now with N/V and fevers, gallstones on CT, possible cholecystitis  Assessment:  Blood culture growing E. Coli.    Name of physician (or Provider) Contacted:  Dr. Otelia Limes  Current antibiotics: None  Changes to prescribed antibiotics recommended:   Recommend adding Rocephin 2 g IV q24h  Results for orders placed or performed during the hospital encounter of 12/07/17  Blood Culture ID Panel (Reflexed) (Collected: 01/02/2018  8:36 AM)  Result Value Ref Range   Enterococcus species NOT DETECTED NOT DETECTED   Listeria monocytogenes NOT DETECTED NOT DETECTED   Staphylococcus species NOT DETECTED NOT DETECTED   Staphylococcus aureus NOT DETECTED NOT DETECTED   Streptococcus species NOT DETECTED NOT DETECTED   Streptococcus agalactiae NOT DETECTED NOT DETECTED   Streptococcus pneumoniae NOT DETECTED NOT DETECTED   Streptococcus pyogenes NOT DETECTED NOT DETECTED   Acinetobacter baumannii NOT DETECTED NOT DETECTED   Enterobacteriaceae species DETECTED (A) NOT DETECTED   Enterobacter cloacae complex NOT DETECTED NOT DETECTED   Escherichia coli DETECTED (A) NOT DETECTED   Klebsiella oxytoca NOT DETECTED NOT DETECTED   Klebsiella pneumoniae NOT DETECTED NOT DETECTED   Proteus species NOT DETECTED NOT DETECTED   Serratia marcescens NOT DETECTED NOT DETECTED   Carbapenem resistance NOT DETECTED NOT DETECTED   Haemophilus influenzae NOT DETECTED NOT DETECTED   Neisseria meningitidis NOT DETECTED NOT DETECTED   Pseudomonas aeruginosa NOT DETECTED NOT DETECTED   Candida albicans NOT DETECTED NOT DETECTED   Candida glabrata NOT DETECTED NOT DETECTED   Candida krusei NOT DETECTED NOT DETECTED   Candida parapsilosis NOT DETECTED NOT DETECTED   Candida tropicalis NOT DETECTED NOT DETECTED    [name], [name] 01/03/2018   1:13 AM

## 2018-01-03 NOTE — Progress Notes (Addendum)
STROKE TEAM DAILY PROGRESS NOTE   SUBJECTIVE Partner is at bedside. Pt came back from HD at 2:10pm, developed altered mental status with diffuse diaphoresis, hypotensive SBP 60s, lack of responsive, hypoxia with O2 sat down to 90, and tachycardia at 110s. Put pt bed flat, started IV bolus, put on non-rebreather. Pt gradually more responsive, although still lethargic. Denies abdominal pain. SBP up to 70s. CCM consulted and transferred to ICU. BP getting improved to 120s, but have spike fever at 101.3.   Earlier today had IM hospitalist consult and found to have elevated LFTs and lipase, concerning for biliary pancreatitis. Kept NPO, and started on ampicillin. He did not have adequate po for the last several days and today had HD removed 2L of fluid. His blood culture showed E. Coli bacteremia.   Physical Examination: Vitals:   01/02/18 0859 01/02/18 1418 01/02/18 1749 01/02/18 2046  BP: (!) 123/59 (!) 114/56 121/69 (!) 120/54  Pulse: 80 67 76 79  Resp: _0 Temp: 98.7 F (37.1 C) 98 F (36.7 C) 97.8 F (36.6 C) 98.1 F (36.7 C)  TempSrc: Oral Oral Oral Oral  SpO2: 96% 97% 97% 98%  Weight:      Height:       HEENT-  Normocephalic,  Cardiovascular - Regular rate and rhythm, + murmur Respiratory - Lungs clear bilaterally. Non-labored breathing, No wheezing. Abdomen - soft and tender, BS normal, complaining of pain with palpation Extremities- no edema or cyanosis Skin-warm and dry  Neurological Examination Mental Status: Awake,oriented x 3  Speech is slightly dysarthric. Able to follow 3 step commands without difficulty. Cranial Nerves: II: Left field cut. Pupils are equal, round, and reactive to light.   III,IV, VI: EOMI without ptosis or diploplia. No nystagmus.   V: Facial sensation is decreased on the Left VII: Facial movement is decreased on the Left VIII: hearing is intact to voice X: Uvula elevates symmetrically XI: Shoulder shrug is decreased  on the left XII: tongue is midline without atrophy or fasciculations.  Motor: Tone is normal. Bulk is normal. 5/5 strength on the Right.  Dense left hemiplegia and Strength is 1/5 on the Left leg and 0-1/5 in left  arm   Sensor: Sensation is decreased on the Left in the arms and legs. Normal on the Right Deep Tendon Reflexes: 1+ and symmetric throughout in the biceps and patellae Plantars: Toes are downgoing bilaterally.  Cerebellar: unable to perform finger-to-nose on Left Gait: Truncal ataxia to Left   Lab Results: CBC: Recent Labs  Lab 01/01/18 0814 01/02/18 0402 01/03/18 0547  WBC 11.9* 9.9 7.9  HGB 11.7* 12.9* 11.3*  HCT 36.0* 39.7 35.0*  MCV 91.4 92.1 90.0  PLT 307 261 253   Basic Metabolic Panel: Recent Labs  Lab 12/29/17 0502 12/30/17 1225 01/01/18 0814 01/02/18 0402 01/03/18 0547  NA 131* 129* 129* 136 130*  K 3.9 4.4 4.2 3.4* 4.5  CL 92* 90* 90* 91* 91*  CO2 23 20* _1 GLUCOSE 92 151* 114* 120* 93  BUN 32* 56* 43* 25* 46*  CREATININE 5.67* 8.02* 6.26* 4.85* 6.22*  CALCIUM 8.8* 8.5* 8.9 9.2 8.7*  PHOS  --  6.0* 5.0*  --   --    Liver Function Tests: Recent Labs  Lab 12/30/17 1225 01/01/18 0814  ALBUMIN 3.5 3.0*   Imaging: I have personally reviewed the radiological images below and agree with the radiology interpretations.  Ct Head Code Stroke Wo Contrast Result Date: 12/07/2017 IMPRESSION:  1. Acute right thalamic hemorrhage. Mild edema and local mass effect.  2. 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  3. New age indeterminate left thalamic lacunar infarct.   Ct Head Wo Contrast 12/27/2017  IMPRESSION: Recent right thalamic intraparenchymal hematoma is now isodense to edematous brain.  No evidence of additional bleeding. Old hemorrhagic infarction in the right basal ganglia. Resolution of intraventricular blood.  Ventricular size stable.  DG Chest Port 1 View 12/26/2017 IMPRESSION: No  active disease.  Ct Angio Head W Or Wo Contrast Result Date: 12/07/2017 IMPRESSION: 1. No aneurysm, vascular malformation, or spot sign associated with the right thalamic hemorrhage. 2. Patent circle of Willis with intracranial atherosclerosis as above.  Moderate to severe right PCA stenoses.   Ct Head Wo Contrast Result Date: 12/08/2017 IMPRESSION:  1. Stable right thalamic hematoma and small volume of hemorrhage in right lateral ventricle.  2. Mild increase of mass effect with 5 mm right-to-left midline shift, previously 4 mm.  3. No new acute intracranial abnormality identified.   ECHO  12/12/2017 Study Conclusions - Left ventricle: The cavity size was normal. There was moderate   concentric hypertrophy. Systolic function was normal. The   estimated ejection fraction was in the range of 55% to 60%. Wall   motion was normal; there were no regional wall motion   abnormalities. There was a reduced contribution of atrial   contraction to ventricular filling, due to increased ventricular   diastolic pressure or atrial contractile dysfunction. Doppler   parameters are consistent with a reversible restrictive pattern,   indicative of decreased left ventricular diastolic compliance   and/or increased left atrial pressure (grade 3 diastolic   dysfunction). Doppler parameters are consistent with high   ventricular filling pressure. - Aortic valve: Trileaflet; mildly thickened, mildly calcified   leaflets. Moderate focal calcification involving the left   coronary cusp. - Aorta: Ascending aorta diameter: 40 mm (ED). - Ascending aorta: The ascending aorta was mildly dilated. - Mitral valve: Calcified annulus. There was mild regurgitation. - Left atrium: The atrium was mildly dilated. - Right ventricle: The cavity size was mildly dilated. Wall   thickness was normal. - Tricuspid valve: There was trivial regurgitation  Carotid Duplex           12/11/2017 Final Interpretation: Right Carotid:   The extracranial vessels were near-normal with only minimal wall thickening or plaque. Left Carotid: The extracranial vessels were near-normal with only minimal wall thickening or plaque. Vertebrals: Left vertebral artery was patent with antegrade flow.  Right vertebral artery was not visualized. Subclavians: Normal flow hemodynamics were seen in the right subclavian artery.  Left subclavian artery exhibits monophasic flow, suggestive of possbile proximal obstruction  ABDOMEN - 1 VIEW      12/30/2017 11:34 IMPRESSION: Air-filled loops of small and large bowel noted suggesting adynamic ileus. Follow-up exam suggested to exclude bowel obstruction.  ABDOMEN - 1 VIEW      12/31/2017 09:48 IMPRESSION No evidence of bowel obstruction or ileus.  PORTABLE CHEST 1 VIEW 01/02/2018 08:42 IMPRESSION: No evidence of acute cardiopulmonary disease  CT Abdomen/Pelvis 01/02/2018 Multiple gallstones with thickening and distention of the gallbladder with slight haziness in the pericholecystic fat which could represent acute or chronic cholecystitis. Small amount of free fluid in the pelvis.  HIDA scan pending     IMPRESSION: Mr. GREOGRY GOODWYN is a 52 y.o. male with PMH of ESRD on Dialysis, HTN, Hx of CVA without deficits, BPH and Depression that  presents to the ED as a code stroke with reports of acute onset Left sided weakness, Left facial droop, decreased sensation on the Left, slightly dysarthric speech. . EMS reports that patient fell out of his bed at approximately 15:40. Patients room mate states that he last saw him normal at 07:00 this morning. CT Head reveals:  Acute right thalamic hemorrhage with mild cytotoxic edema New age indeterminate left thalamic lacunar infarct  Suspected Etiology: HTN hemorrhage and small vessel disease Resultant Symptoms: Left sided weakness, Left facial droop, decreased sensation on Left Stroke Risk Factors: hypertension, ESRD on Dialysis Other Stroke  Risk Factors: Advanced age, ?ETOH use, Hx stroke  Outstanding Stroke Work-up Studies:   Work up completed at this time  12/30/2017: Patient had large bowel movement overnight, complaining of abdominal pain this morning.  He will be showed air loops, suggesting adynamic ileus.  GI consult recommended NG tube for suctioning, n.p.o. with KUB in a.m.  Due to ESRD on HD, no IV fluids per nephrology.  Patient refused NG tube so far, currently no abdominal pain, no nausea vomiting.  Patient stated that he feels okay.  However we will continue n.p.o. except meds and ice chips, with no IV fluid and KUB in a.m.  Neurologically stable.  12/31/2017: Repeat Abdominal Xray shows no bowel obstruction or ileus today. Patient denies any abdominal pain on exam today. Diet advanced and bowel regime in progress. Remains neurologically stable. HD in AM. Awaiting admission decision from outpatient HD facility for transfer to SNF in [place]. Patient and partner updated on POC.   01/01/2018: This afternoon complains of abdominal pain & Nausea after HD. Abdominal Xray 1/31 - Negative. Diet advanced to clear liquids. Last BM on 1/29. Dulcolax suppository ordered. Will Advance diet to clears for now. Remains neurologically stable. Discharge to SNF in [place] planned for Sunday  01/02/2018: No complaint of abdominal pain today. +vomiting and nausea overnight. +BM after Dulcolax suppository. TMax 101.2. Will obtain CT Abdomen/Pelvis today. Bl Cx pending. Continue with clear liquid diet for now. Will continue to monitor closely  PLAN  01/03/2018: Awaiting bed in SNF for discharge - Plan once Medicaid number accepted by HD outpatient facility Frequent Neurochecks  Telemetry Monitoring Continue to HOLD Aspirin as patient has a right ICH For worsening mentation, obtain STAT Head CT- and contact Neurology immediately F/U PT/OT/SLP at SNF Ongoing aggressive stroke risk factor management Patient counseled to be compliant with his  antithrombotic medications Patient counseled on Lifestyle modifications including, Diet, Exercise, and Stress Follow up with [facility] in 6 weeks  HX OF STROKES:  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 No obstruction or ileus No abdominal pain & Nausea today, +BM last PM  Abdominal Xray 1/31 - Negative. Diet advanced to clear liquids  Constipation +BM documented last PM after Dulcolax suppository Miralax and Senna in progress  ESRD on Dialysis - HD  (Saturday)  Stable, Nephrology following, Appreciate Assistance- HD in AM  HD today  Creatinine - 4.85  Anemia Chronic  Stable, trending up slowly   Iron Studies in progress  Nephrology to start ESA, IV Iron, Aranesp qHD  Repeat labs in AM  Hyperkalemia Stable - HD  - K+3.4 Repeat labs in AM  Hyponatremia  Stable, Nephrology following  Na 129   Hypocalcemia - Secondary Hyperparathyroidism  Stable, Nephrology following   9.2  Elevated Alk Phos  Stable, Nephrology following  12/17/2017 - 253  Elevated Phosphorus  Stable, Nephrology following 5.0  Leg Spasms  Zanaflex BID PRN  Nausea Zofran PRN Will continue to monitor closely  Diarrhea- Resolved  Imodium PRN  HYPERTENSION:  Stable, some elevated B/P's noted overnight  SBP goal less than 140/90 most of the time  Clonidine dose increased to 0.2 mg BID  Labetolol PRN  Long term BP goal normotensive.  Patient currently on: Norvasc, Hydralazine  HYPERLIPIDEMIA  Home Meds:  NONE  LDL  goal < 70, LDL 73  Continue Lipitor 10  mg  Continue statin at discharge  DIABETES:  HgbA1c goal < 7.0, HgbA1C = 4.0  Currently on:  Novolog  Continue CBG monitoring and SSI to maintain glucose 140-180 mg/dl  DM education   Other Active Problems: Active Problems:   IVH (intraventricular hemorrhage) (HCC)   Dysphagia, post-stroke   Type 2 diabetes mellitus with peripheral neuropathy (HCC)   History of tobacco abuse   Benign essential HTN   ESRD on dialysis ([place])   History of traumatic brain injury   History of CVA with residual deficit   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 Yes; Fluid consistency: Thin   FAMILY UPDATES:  family at bedside  TEAM UPDATES:Xu, Jindong, MD   Prior Home Stroke Medications:  No antithrombotic  Discharge Stroke Meds:  Please discharge patient on TBD , will likely add ASA prior to discharge  Disposition: [place]:   [id number], Discussion with MSW,                                 Discharge to [facility] now scheduled for Sunday  Follow Up:  Follow-up Information    Garvin Fila, MD. Schedule an appointment as soon as possible for a visit in 6 week(s).   Specialties:  Neurology, Radiology Contact information: [address] [place] [address] [place], [place], MD -PCP Follow up in 1-2 weeks      Assessment & plan discussed with with attending physician and they are in agreement.     ATTENDING NOTE: I reviewed above note and agree with the assessment and plan. I have made any additions or clarifications directly to the above note. Pt was seen and examined.   Earlier today had IM hospitalist consult for E. Coli bacteremia and CT showed possible acute vs. Chronic cholecystitis. Found to have elevated LFTs and lipase, concerning for biliary pancreatitis.  Kept NPO, and started on ampicillin. He did not have adequate po for the last several days and today had HD removed 2L of fluid.   Pt came back from HD and while on ampicillin infusion at about 2:10pm, developed altered mental status with diffuse diaphoresis, hypotensive SBP 60s, lack of responsive, hypoxia with O2 sat down to 90, and tachycardia at 110s. Put pt bed flat, started IV bolus, put on non-rebreather. Pt gradually more responsive, although still lethargic. Denies abdominal pain. SBP  up to 64s. CCM consulted and transferred to ICU. BP getting improved to 120s, but have spike fever at 101.3.   Pt condition change most likely due to hypotensive event likely due to dehydration on HD, septic shock with fever and bacteremia in the setting of biliary pancreatitis and cholecystitis. Appreciate CCM and IM help. Pt needs ICU care at this time. Neuro stable, right ICH resolved on ASA 32m.   JRosalin Hawking MD PhD Stroke Neurology 01/03/2018 11:20 PM  This patient is critically ill due to acute hypotension, septic shock, dehydration on HD, biliary pancreatitis, cholecystitis and at significant risk of neurological worsening, death form sepsis, heart failure, cardiac arrest, hypotension. This patient's care requires constant monitoring of vital signs, hemodynamics, respiratory and cardiac monitoring, review of multiple databases, neurological assessment, discussion with family, other specialists and medical decision making of high complexity. I spent 50 minutes of neurocritical care time in the care of this patient.     Please page stroke NP  Or  PA  Or MD from 8am -4 pm  as this patient from this time will be  followed by the stroke.   You can look them up on [email or web address]  Password [email or web address]

## 2018-01-03 NOTE — Progress Notes (Signed)
[facility] Progress Note   Subjective:   CT yesterday gallstones/thickening and distension of gallbladder Blood cultures done 2/1 already growing GNR's both bottles and rocephin started  Objective Vitals:   01/03/18 0755 01/03/18 0800 01/03/18 0830 01/03/18 0900  BP: (!) 114/55 (!) 114/56 (!) 100/49 (!) 100/46  Pulse: 68 70 64 63  Resp:   20   Temp: 98 F (36.7 C)     TempSrc:      SpO2: 98%     Weight: 65.5 kg (144 lb 6.4 oz)     Height:       Physical Exam Sitting in the chair with his stuffed animals Happy NAD  VS as noted Facial droop Lungs clear Normal heart sounds No murmur Abd + BS. Mildly tender, not distended Dense L hemiparesis (some L thumb movement) Foley yellow urine (300-550/day) Dialysis Access: L AVF +bruit    Recent Labs  Lab 12/30/17 1225 01/01/18 0814 01/02/18 0402 01/03/18 0547  NA 129* 129* 136 130*  K 4.4 4.2 3.4* 4.5  CL 90* 90* 91* 91*  CO2 20* 24 25 24   GLUCOSE 151* 114* 120* 93  BUN 56* 43* 25* 46*  CREATININE 8.02* 6.26* 4.85* 6.22*  CALCIUM 8.5* 8.9 9.2 8.7*  PHOS 6.0* 5.0*  --   --     Recent Labs  Lab 12/29/17 0502 12/30/17 1225 01/01/18 0814 01/02/18 0402 01/03/18 0547  WBC 6.0 9.4 11.9* 9.9 7.9  HGB 11.0* 12.0* 11.7* 12.9* 11.3*  HCT 35.3* 36.3* 36.0* 39.7 35.0*  MCV 94.9 91.2 91.4 92.1 90.0  PLT 318 313 307 261 268    Recent Labs  Lab 01/01/18 1629 01/01/18 2102 01/02/18 0605 01/02/18 1212 01/02/18 1648  GLUCAP 154* 99 120* 126* 164*   Blood culture 01/02/18 Special Requests BOTTLES DRAWN AEROBIC AND ANAEROBIC Blood Culture adequate volume   Culture Setup Time GRAM NEGATIVE RODS  IN BOTH AEROBIC AND ANAEROBIC BOTTLES  CRITICAL RESULT CALLED TO, READ BACK BY AND VERIFIED WITH:  G ABOTT PHARMD 01/03/18 1252 JDW  Performed at [facility], [address], [place], [place]     Culture GRAM NEGATIVE RODS    Enterococcus species NOT DETECTED NOT DETECTED     Listeria  monocytogenes NOT DETECTED NOT DETECTED     Staphylococcus species NOT DETECTED NOT DETECTED     Staphylococcus aureus NOT DETECTED NOT DETECTED  POSITIVE Abnormal  R, CM NEGATIVE R, CM  Streptococcus species NOT DETECTED NOT DETECTED     Streptococcus agalactiae NOT DETECTED NOT DETECTED     Streptococcus pneumoniae NOT DETECTED NOT DETECTED     Streptococcus pyogenes NOT DETECTED NOT DETECTED     Acinetobacter baumannii NOT DETECTED NOT DETECTED     Enterobacteriaceae species NOT DETECTED DETECTED Abnormal      Comment: Enterobacteriaceae represent a large family of gram-negative bacteria, not a single organism.  CRITICAL RESULT CALLED TO, READ BACK BY AND VERIFIED WITH:  G ABOTT PHARMD 01/03/18 1252 JDW   Enterobacter cloacae complex NOT DETECTED NOT DETECTED     Escherichia coli NOT DETECTED DETECTED Abnormal      Comment: CRITICAL RESULT CALLED TO, READ BACK BY AND VERIFIED WITH:  G ABOTT PHARMD 01/03/18 1252 JDW      Medications: . cefTRIAXone (ROCEPHIN)  IV Stopped (01/03/18 0310)   .  stroke: mapping our early stages of recovery book   Does not apply Once  . amLODipine  10 mg Oral QHS  . aspirin EC  81 mg Oral Daily  . atorvastatin  10 mg Oral q1800  . calcitRIOL  0.75 mcg Oral Once per day on Tue Thu Sat  . calcium carbonate  1 tablet Oral Daily  . cloNIDine  0.2 mg Oral BID  . feeding supplement (NEPRO CARB STEADY)  237 mL Oral BID BM  . hydrALAZINE  75 mg Oral Q8H  . insulin aspart  0-9 Units Subcutaneous TID WC  . lanthanum  1,000 mg Oral TID WC  . multivitamin  1 tablet Oral QHS  . pantoprazole  40 mg Oral QHS  . polyethylene glycol  17 g Oral BID  . potassium chloride  40 mEq Oral BID  . senna-docusate  1 tablet Oral BID   Dialysis Orders:  TTS at [facility] 4hr  65kg New EDW at discharge 2/2bath  AVF  Hep 2000 (NO heparin x 4 wks from bleed/ CVA, 4 wks = Feb 6) - Mircera IV q 2 weeks (last 1/3) - Calcitriol 0.43mcg PO q HD Ct Abdomen Pelvis Wo  Contrast  Result Date: 01/02/2018 CLINICAL DATA:  Periumbilical pain with nausea and vomiting. EXAM: CT ABDOMEN AND PELVIS WITHOUT CONTRAST TECHNIQUE: Multidetector CT imaging of the abdomen and pelvis was performed following the standard protocol without IV contrast. COMPARISON:  Abdominal radiograph dated 01/01/2018 FINDINGS: Lower chest: Minimal left base atelectasis. Coronary artery calcifications. Hepatobiliary: Numerous gallstones. Thickening of the gallbladder wall. The gallbladder is distended. Common bile duct is dilated to a diameter of 8 mm. No dilated intrahepatic bile ducts. Pancreas: Unremarkable. No pancreatic ductal dilatation or surrounding inflammatory changes. Spleen: Normal in size without focal abnormality. Adrenals/Urinary Tract: Adrenal glands are unremarkable. Kidneys are normal, without renal calculi, focal lesion, or hydronephrosis. Bladder is unremarkable. Stomach/Bowel: Stomach is within normal limits. Appendix appears normal. No evidence of bowel wall thickening, distention, or inflammatory changes. Scattered diverticula in the descending colon. Vascular/Lymphatic: Aortic atherosclerosis. No enlarged abdominal or pelvic lymph nodes. Reproductive: Prostate is unremarkable. Other: Tiny amount of free fluid in the pelvis. Slight soft tissue stranding in the perinephric spaces and pericolic gutters. No free air. Musculoskeletal: Bilateral hip prostheses. Chronic fusion of the sacroiliac joints. IMPRESSION: Multiple gallstones with thickening and distention of the gallbladder with slight haziness in the pericholecystic fat which could represent acute or chronic cholecystitis. Small amount of free fluid in the pelvis. Electronically Signed   By: Francene Boyers M.D.   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 L facial droop. Only recovery has been L thumb movement.  2. ESRD-HD TTS.  Per neuro wait 4 wks (Feb 6th) before resuming heparin w  HD, wait longer if possible. HD today. 3. GNR bacteremia - enterobacter and EColi. Primary team notes possible gallbladder source. Also has indwelling foley and urine culture needs to be sent (done in HD). On Rocephin per primary service. 4. Anemiaof CKD-Hgb stable.On IV iron and Aranesp qHD (Tues) - holding Aranesp at this time 5. MBD of CKD-Continue calcitriol on HD days + binders. Since ileus vs obstr question on previous xrays changed from sevelamer to fosrenol 6. HTN/volume- Unable to accurately determine EDW. Weights have varied al over the place from "28 kg to 70 kg". Pre HD today bed weight 65.5 kg with 2 liter goal. See what post bed weight is. BP good on current med program.  7. Nutrition- Renal diet, rena-vite,Nepro TID.  8. Type 2 DM - per primary 9. Dispo - new bacteremia and workup  for that will certainly delay discharge. Plans for evaluation per primary service. Urine culture sent.   Camille Bal, MD [facility] [phone] Pager 01/03/2018, 9:33 AM

## 2018-01-03 NOTE — Progress Notes (Signed)
Pt has transfer orders to [facility] and report called off to RN April on the unit. Dionne Bucy RN

## 2018-01-03 NOTE — Significant Event (Signed)
Rapid Response Event Note  Overview: Called by RN for patient unresponsive and hypotension Time Called: 1425 Arrival Time: 1430 Event Type: Hypotension, Other (Comment)  Initial Focused Assessment:  On my arrival to patietns room, RN and Dr. Roda Shutters at bedside.  As per RN, recently returned from HD, antibiotic was started and was given hypertension medications.  PAtietn became unresponsive and turned to left.  Never lost pulse or became apneic.  BP was noted to be SBP 60's, HR 120's CBG 120's.  Patient did start to come around with moaning, and eventually started talking to [place].  He is now back to baseline, states he felt fine through HD and up to the point of antibiotic initiation.   Interventions:  250 NS bolus given.  CCM consulted.  To transfer to ICU.    Plan of Care (if not transferred):  Event Summary:   at      at          [facility]

## 2018-01-03 NOTE — Progress Notes (Signed)
Blood culture came back positive for E. Coli. Discussed with pharmacy. Starting ceftriaxone 2 g IV q24h x 10 days. AM rounding stroke team to discuss possible additional antibiotic regimen for acute versus chronic cholecystitis seen on CT of abdomen/pelvis.   Electronically signed: Dr. Caryl Pina

## 2018-01-03 NOTE — Consult Note (Signed)
Name: Wesley Burns MRN: [id number] DOB: Nov 06, 1966    ADMISSION DATE:  12/07/2017 CONSULTATION DATE: January 03, 2018  REFERRING MD : Neurology service  CHIEF COMPLAINT: Severe low blood pressure.  Mental status drowsiness  BRIEF PATIENT DESCRIPTION: Was called for altered mental status severe low blood pressure shock  SIGNIFICANT EVENTS  Patient blood pressure after dialysis  STUDIES:     HISTORY OF PRESENT ILLNESS: Very complex past medical history 52 year old Caucasian male who is here with intracranial hemorrhage almost a month ago with left-sided weakness was seen by neurology he also had end-stage renal disease diabetes and hypertension during his stay he developed pancreatitis bacteremia transaminitis and increased bilirubin today patient had dialysis after dialysis he started having severe hypertension to the degree that his blood pressure was in the 50s or 60s while at the time I get to him patient was drowsy lethargic started giving him a fluid bolus of 500 cc transfer him today and if the patient also has increase in his bilirubin he has bacteremia with E. coli and Enterobacter he was given ampicillin sulbactam and Flagyl which was DC'd and changed to meropenem and vancomycin patient has been n.p.o. for several days given his generalized abdominal pain not eating good.  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  PAST MEDICAL HISTORY :   has a past medical history of Anemia, Anxiety, Charcot's joint, Diabetic foot ulcers (HCC), Elevated liver function tests, ESRD (end stage renal disease) on dialysis (HCC), GERD (gastroesophageal reflux disease), Head injury (01/1999), Hip dislocation, right (HCC), History of blood transfusion, History of gout, Hypertension, Neuropathy, Osteomyelitis (HCC), Rheumatoid arthritis (HCC), Stroke (HCC) (06/2016), Stroke (HCC) (12/07/2017), and Type II diabetes mellitus (HCC).  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 Prior to Admission medications   Medication Sig Start Date End Date Taking? Authorizing Provider  amLODipine (NORVASC) 10 MG tablet Take 1 tablet (10 mg total) by mouth daily. 07/19/16  Yes Love, Evlyn Kanner, PA-C  FLUoxetine (PROZAC) 10 MG capsule Take 1 capsule (10 mg total) by mouth daily. 07/19/16  Yes Love, Evlyn Kanner, PA-C  lidocaine-prilocaine (EMLA) cream APPLY TO ACCESS PREDIALYSIS 09/25/17  Yes [provider]  pantoprazole (PROTONIX) 40 MG tablet Take 1 tablet (40 mg total) by mouth daily. 07/19/16  Yes Love, Evlyn Kanner, PA-C  sevelamer carbonate (RENVELA) 800 MG tablet Take 1 tablet (800 mg total) by mouth 3 (three) times daily with meals. Patient taking differently: Take 800 mg by mouth 2 (two) times daily.  07/19/16  Yes Love, Evlyn Kanner, PA-C  silver sulfADIAZINE (SILVADENE) 1 % cream Apply 1 application daily topically. Patient taking differently: Apply 1 application topically See admin instructions. 1 application to ulcer on affected foot once a day 10/06/17  Yes Tuchman, Richard C, DPM  bethanechol (URECHOLINE) 10 MG tablet Take 1 tablet (10 mg total) by mouth 3 (three) times daily. Patient not taking: Reported on 12/07/2017 07/19/16   Love, Evlyn Kanner, PA-C  cloNIDine (CATAPRES) 0.1 MG tablet Take 1 tablet (0.1 mg total) by mouth 2 (two) times daily. Patient not taking: Reported on 12/07/2017  07/19/16   Love, Evlyn Kanner, PA-C  febuxostat (ULORIC) 40 MG tablet Take 20 mg by mouth daily.     [provider]  ferrous sulfate 325 (65 FE) MG tablet Take 1 tablet (325 mg total) by mouth 3 (three) times daily with meals. Patient not taking: Reported on 12/07/2017 07/19/16   Love, Evlyn Kanner, PA-C  furosemide (LASIX) 80 MG tablet Take 2 tablets (160 mg total) by mouth 3 (three) times daily. Patient not taking: Reported on 12/07/2017 07/19/16   Love, Evlyn Kanner, PA-C  magnesium oxide (MAG-OX) 400 (241.3 Mg) MG tablet Take 1 tablet (400 mg total) by mouth daily. Patient not taking: Reported on 12/07/2017 07/19/16   Love, Evlyn Kanner, PA-C  oxyCODONE-acetaminophen (PERCOCET) 5-325 MG tablet Take 1 tablet by mouth every 6 (six) hours as needed for severe pain. Patient not taking: Reported on 12/07/2017 10/02/16   Dara Lords, PA-C  sodium bicarbonate 650 MG tablet Take 1 tablet (650 mg total) by mouth 3 (three) times daily. Patient not taking: Reported on 12/07/2017 07/19/16   Love, Evlyn Kanner, PA-C  tamsulosin (FLOMAX) 0.4 MG CAPS capsule Take 1 capsule (0.4 mg total) by mouth daily after supper. Patient not taking: Reported on 12/07/2017 07/19/16   Jacquelynn Cree, PA-C   Allergies  Allergen Reactions  . Prednisone Hives, Nausea And Vomiting and Swelling    Joint swelling, also  . Adhesive [Tape] Rash    FAMILY HISTORY:  family history includes Arthritis in his mother; Diabetes in his father; Hypertension in his father. SOCIAL HISTORY:  reports that he has been smoking cigarettes.  He has a 1.10 pack-year smoking history. He has quit using smokeless tobacco. His smokeless tobacco use included snuff. He reports that he drinks about 3.0 oz of alcohol per week. He reports that he uses drugs. Drug: Marijuana.  REVIEW OF SYSTEMS:   Constitutional: Negative for fever, chills, weight loss, malaise/fatigue and diaphoresis.  HENT: Negative for hearing loss, ear pain, nosebleeds, congestion, sore throat, neck pain, tinnitus and ear discharge.   Eyes: Negative for blurred vision, double vision,  photophobia, pain, discharge and redness.  Respiratory: Negative for cough, hemoptysis, sputum production, shortness of breath, wheezing and stridor.   Cardiovascular: Negative for chest pain, palpitations, orthopnea, claudication, leg swelling and PND.  Gastrointestinal: Negative for heartburn, nausea, vomiting, abdominal pain, diarrhea, constipation, blood in stool and melena.  Genitourinary: Negative for dysuria, urgency, frequency, hematuria and flank pain.  Musculoskeletal: Negative for myalgias, back pain, joint pain and falls.  Skin: Negative for itching and rash.  Neurological: Negative for dizziness, tingling, tremors, sensory change, speech change, focal weakness, seizures, loss of consciousness, weakness and headaches.  Endo/Heme/Allergies: Negative for environmental allergies and polydipsia. Does not bruise/bleed easily.  SUBJECTIVE:   VITAL SIGNS: Temp:  [97.8 F (36.6 C)-99.4 F (37.4 C)] 99.4 F (37.4 C) (02/02 1359) Pulse Rate:  [63-140] 110 (02/02 1513) Resp:  [20] 20 (02/02 1513) BP: (61-136)/(31-69) 76/41 (02/02 1513) SpO2:  [96 %-99 %] 99 % (02/02 1513) Weight:  [139 lb 15.9 oz (63.5 kg)-144 lb 6.4 oz (65.5 kg)] 139 lb 15.9 oz (63.5 kg) (02/02 1200)  PHYSICAL EXAMINATION: Constitutional:  Drowsy sluggish unresponsive Eyes: PERRL, lids and conjunctivae normal ENMT: Mucous membranes are dry. Posterior pharynx clear of any exudate or lesions.Normal dentition.  Neck: normal, supple, no masses, no thyromegaly Respiratory: clear to auscultation bilaterally, no wheezing, no crackles. Normal respiratory effort. No accessory muscle use.  Cardiovascular: Regular rate and rhythm, no murmurs / rubs /  gallops. No extremity edema. 2+ pedal pulses. No carotid bruits.  Abdomen: Focal RUQ tenderness upon palpation, no masses palpated. No hepatosplenomegaly. Bowel sounds positive.  Genitourinary: Condom catheter in place with cloudy urine to bedside bag Musculoskeletal: no clubbing  / cyanosis. No joint deformity upper and lower extremities. Good ROM, no contractures. Normal muscle tone.  Skin: no rashes, lesions, ulcers. No induration Neurologic: CN 2-12 grossly intact. DTR normal. Strength 5/5 on the right side; strength is 1/5 on left side.  Discrete sensation not tested. Psychiatric: Alert and oriented x 3.  Flat/bland mood and affect.     Recent Labs  Lab 01/01/18 0814 01/02/18 0402 01/03/18 0547  NA 129* 136 130*  K 4.2 3.4* 4.5  CL 90* 91* 91*  CO2 24 25 24   BUN 43* 25* 46*  CREATININE 6.26* 4.85* 6.22*  GLUCOSE 114* 120* 93   Recent Labs  Lab 01/01/18 0814 01/02/18 0402 01/03/18 0547  HGB 11.7* 12.9* 11.3*  HCT 36.0* 39.7 35.0*  WBC 11.9* 9.9 7.9  PLT 307 261 268   Ct Abdomen Pelvis Wo Contrast  Result Date: 01/02/2018 CLINICAL DATA:  Periumbilical pain with nausea and vomiting. EXAM: CT ABDOMEN AND PELVIS WITHOUT CONTRAST TECHNIQUE: Multidetector CT imaging of the abdomen and pelvis was performed following the standard protocol without IV contrast. COMPARISON:  Abdominal radiograph dated 01/01/2018 FINDINGS: Lower chest: Minimal left base atelectasis. Coronary artery calcifications. Hepatobiliary: Numerous gallstones. Thickening of the gallbladder wall. The gallbladder is distended. Common bile duct is dilated to a diameter of 8 mm. No dilated intrahepatic bile ducts. Pancreas: Unremarkable. No pancreatic ductal dilatation or surrounding inflammatory changes. Spleen: Normal in size without focal abnormality. Adrenals/Urinary Tract: Adrenal glands are unremarkable. Kidneys are normal, without renal calculi, focal lesion, or hydronephrosis. Bladder is unremarkable. Stomach/Bowel: Stomach is within normal limits. Appendix appears normal. No evidence of bowel wall thickening, distention, or inflammatory changes. Scattered diverticula in the descending colon. Vascular/Lymphatic: Aortic atherosclerosis. No enlarged abdominal or pelvic lymph nodes. Reproductive:  Prostate is unremarkable. Other: Tiny amount of free fluid in the pelvis. Slight soft tissue stranding in the perinephric spaces and pericolic gutters. No free air. Musculoskeletal: Bilateral hip prostheses. Chronic fusion of the sacroiliac joints. IMPRESSION: Multiple gallstones with thickening and distention of the gallbladder with slight haziness in the pericholecystic fat which could represent acute or chronic cholecystitis. Small amount of free fluid in the pelvis. Electronically Signed   By: Francene Boyers M.D.   On: 01/02/2018 12:59   Dg Abd 1 View  Result Date: 01/01/2018 CLINICAL DATA:  Nausea and vomiting. EXAM: ABDOMEN - 1 VIEW COMPARISON:  December 31, 2017 FINDINGS: Vascular calcifications in the pelvis. Bilateral hip replacements. No evidence of bowel obstruction. No cause for nausea and vomiting identified. IMPRESSION: Negative. Electronically Signed   By: Gerome Sam III M.D   On: 01/01/2018 15:36   Dg Chest Port 1 View  Result Date: 01/02/2018 CLINICAL DATA:  Fever EXAM: PORTABLE CHEST 1 VIEW COMPARISON:  12/26/2017 FINDINGS: Lungs are clear.  No pleural effusion or pneumothorax. The heart is normal in size. IMPRESSION: No evidence of acute cardiopulmonary disease. Electronically Signed   By: Charline Bills M.D.   On: 01/02/2018 08:42    ASSESSMENT / PLAN:  --Severe shock combination of severe septic shock and hypo-bulimic shock after dialysis also induced by vasodilation from all of the blood pressure medications at this point DC all blood pressure medications react him slowly after the patient improves will start fluid bolus of 500  cc he already had dialysis today and is not been eating for the past 3 days.  --Sepsis with bacteremia E. coli and Enterobacter we will change antibiotic to meropenem and vancomycin given he has pancreatic and biliary sepsis.  --Hemodialysis end-stage renal disease he had dialysis today he is not been eating for 3 days will add IV fluids stop all  blood pressure medication he would go back on his regular schedule when he improves.  --Pancreatitis biliary sepsis unknown etiology at this point she is unstable to investigate that he will need right upper quadrant ultrasound and possible ERCP if he does not improve for right now we will stop the statin as a potential source of all of this.   --History of intracranial hemorrhage of left-sided weakness this point patient was on aspirin and will continue with the Augmentin however blood pressure since it happened a month ago he was waiting for placement.  --Sliding scale insulin for diabetes. --Patient's will be on SCDs he will be n.p.o. we will repeat lipase tomorrow he is on PPI. --Q for this consultation to discuss question critical care time 45 minutes.  Pulmonary and Critical Care Medicine [facility] Pager: [phone]  01/03/2018, 3:28 PM

## 2018-01-04 ENCOUNTER — Inpatient Hospital Stay (HOSPITAL_COMMUNITY): Payer: Self-pay

## 2018-01-04 LAB — COMPREHENSIVE METABOLIC PANEL
ALT: 48 U/L (ref 17–63)
AST: 52 U/L — ABNORMAL HIGH (ref 15–41)
Albumin: 2.7 g/dL — ABNORMAL LOW (ref 3.5–5.0)
Alkaline Phosphatase: 497 U/L — ABNORMAL HIGH (ref 38–126)
Anion gap: 13 (ref 5–15)
BUN: 26 mg/dL — ABNORMAL HIGH (ref 6–20)
CO2: 23 mmol/L (ref 22–32)
Calcium: 8.9 mg/dL (ref 8.9–10.3)
Chloride: 100 mmol/L — ABNORMAL LOW (ref 101–111)
Creatinine, Ser: 4.42 mg/dL — ABNORMAL HIGH (ref 0.61–1.24)
GFR calc Af Amer: 16 mL/min — ABNORMAL LOW (ref >60)
GFR calc non Af Amer: 14 mL/min — ABNORMAL LOW (ref >60)
Glucose, Bld: 98 mg/dL (ref 65–99)
Potassium: 4.9 mmol/L (ref 3.5–5.1)
Sodium: 136 mmol/L (ref 135–145)
Total Bilirubin: 2.3 mg/dL — ABNORMAL HIGH (ref 0.3–1.2)
Total Protein: 6.1 g/dL — ABNORMAL LOW (ref 6.5–8.1)

## 2018-01-04 LAB — MAGNESIUM: MAGNESIUM: 1.9 mg/dL (ref 1.7–2.4)

## 2018-01-04 LAB — GLUCOSE, CAPILLARY
GLUCOSE-CAPILLARY: 101 mg/dL — AB (ref 65–99)
GLUCOSE-CAPILLARY: 71 mg/dL (ref 65–99)
GLUCOSE-CAPILLARY: 82 mg/dL (ref 65–99)
Glucose-Capillary: 91 mg/dL (ref 65–99)
Glucose-Capillary: 138 mg/dL — ABNORMAL HIGH (ref 65–99)
Glucose-Capillary: 144 mg/dL — ABNORMAL HIGH (ref 65–99)

## 2018-01-04 LAB — CBC WITH DIFFERENTIAL/PLATELET
Basophils Absolute: 0 10*3/uL (ref 0.0–0.1)
Basophils Relative: 0 %
Eosinophils Absolute: 0.1 10*3/uL (ref 0.0–0.7)
Eosinophils Relative: 1 %
HCT: 36.1 % — ABNORMAL LOW (ref 39.0–52.0)
Hemoglobin: 11.4 g/dL — ABNORMAL LOW (ref 13.0–17.0)
Lymphocytes Relative: 12 %
Lymphs Abs: 1.2 10*3/uL (ref 0.7–4.0)
MCH: 29.2 pg (ref 26.0–34.0)
MCHC: 31.6 g/dL (ref 30.0–36.0)
MCV: 92.6 fL (ref 78.0–100.0)
Monocytes Absolute: 1 10*3/uL (ref 0.1–1.0)
Monocytes Relative: 10 %
Neutro Abs: 7.7 10*3/uL (ref 1.7–7.7)
Neutrophils Relative %: 77 %
Platelets: 282 10*3/uL (ref 150–400)
RBC: 3.9 MIL/uL — ABNORMAL LOW (ref 4.22–5.81)
RDW: 18.7 % — ABNORMAL HIGH (ref 11.5–15.5)
WBC: 10 10*3/uL (ref 4.0–10.5)

## 2018-01-04 LAB — PROTIME-INR
INR: 1.13
Prothrombin Time: 14.4 seconds (ref 11.4–15.2)

## 2018-01-04 LAB — LIPASE, BLOOD: LIPASE: 956 U/L — AB (ref 11–51)

## 2018-01-04 LAB — APTT: aPTT: 34 seconds (ref 24–36)

## 2018-01-04 MED ORDER — TECHNETIUM TC 99M MEBROFENIN IV KIT
8.0300 | PACK | Freq: Once | INTRAVENOUS | Status: AC | PRN
  Administered 2018-01-04: 8.03 via INTRAVENOUS

## 2018-01-04 MED ORDER — PANTOPRAZOLE SODIUM 40 MG IV SOLR
40.0000 mg | INTRAVENOUS | Status: DC
  Administered 2018-01-04 – 2018-01-05 (×2): 40 mg via INTRAVENOUS
  Filled 2018-01-04 (×3): qty 40

## 2018-01-04 MED ORDER — MORPHINE SULFATE (PF) 4 MG/ML IV SOLN
2.1000 mg | Freq: Once | INTRAVENOUS | Status: AC
  Administered 2018-01-04: 2.1 mg via INTRAVENOUS
  Filled 2018-01-04: qty 1

## 2018-01-04 NOTE — Progress Notes (Signed)
Name: Wesley Burns MRN: [id number] DOB: [birth year]    ADMISSION DATE:  12/07/2017 CONSULTATION DATE: January 03, 2018  REFERRING MD : Neurology service  CHIEF COMPLAINT: Severe low blood pressure.  Mental status drowsiness  BRIEF PATIENT DESCRIPTION: Was called for altered mental status severe low blood pressure shock  SIGNIFICANT EVENTS  Patient blood pressure after dialysis  STUDIES:     HISTORY OF PRESENT ILLNESS: Very complex past medical history 52 year old Caucasian male who is here with intracranial hemorrhage almost a month ago with left-sided weakness was seen by neurology he also had end-stage renal disease diabetes and hypertension during his stay he developed pancreatitis bacteremia transaminitis and increased bilirubin today patient had dialysis after dialysis he started having severe hypertension to the degree that his blood pressure was in the 50s or 60s while at the time I get to him patient was drowsy lethargic started giving him a fluid bolus of 500 cc transfer him today and if the patient also has increase in his bilirubin he has bacteremia with E. coli and Enterobacter he was given ampicillin sulbactam and Flagyl which was DC'd and changed to meropenem and vancomycin patient has been n.p.o. for several days given his generalized abdominal pain not eating good.  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  SUBJECTIVE:  Feeling better this am  Went for HIDA scan >occlusion at level of the cystic duct c/w acute cholecystitis /slow progression to SB c/w partially patent CBD   VITAL SIGNS: Temp:  [98 F (36.7 C)-101.3 F (38.5 C)] 98.4 F (36.9 C) (02/03 0755) Pulse Rate:  [67-140] 78 (02/03 1100) Resp:  [12-21] 15 (02/03 1100) BP: (61-178)/(31-75) 173/71 (02/03 1100) SpO2:  [93 %-99 %] 95 % (02/03 1100) Weight:  [136 lb 14.5 oz (62.1 kg)-139 lb 15.9 oz (63.5 kg)] 136 lb 14.5 oz (62.1 kg) (02/02 1633)   Exam :  GEN: A/Ox3; pleasant ,  chronically ill appeairng , left sided weakness .   HEENT:  [place]/AT,    Dry mucosa    RESP  Diminished BS in bases   CARD:  RRR, no m/r/g  ,    GI:  Soft , gen tend , BS +   Musco: left sided weakness   Neuro: alert, F/c   Skin: Warm, no lesions or rashes       Recent Labs  Lab 01/02/18 0402 01/03/18 0547 01/04/18 0357  NA 136 130* 136  K 3.4* 4.5 4.9  CL 91* 91* 100*  CO2 25 24 23   BUN 25* 46* 26*  CREATININE 4.85* 6.22* 4.42*  GLUCOSE 120* 93 98   Recent Labs  Lab 01/02/18 0402 01/03/18 0547 01/04/18 0357  HGB 12.9* 11.3* 11.4*  HCT 39.7 35.0* 36.1*  WBC 9.9 7.9 10.0  PLT 261 268 282   Ct Abdomen Pelvis Wo Contrast  Result Date: 01/02/2018 CLINICAL DATA:  Periumbilical pain with nausea and vomiting. EXAM: CT ABDOMEN AND PELVIS WITHOUT CONTRAST TECHNIQUE: Multidetector CT imaging of the abdomen and pelvis was performed following the standard protocol without IV contrast. COMPARISON:  Abdominal radiograph dated 01/01/2018 FINDINGS: Lower chest: Minimal left base atelectasis. Coronary artery calcifications. Hepatobiliary: Numerous gallstones. Thickening of the gallbladder wall. The gallbladder is distended. Common bile duct is dilated to a diameter of 8 mm. No dilated intrahepatic bile ducts. Pancreas: Unremarkable. No pancreatic ductal dilatation or surrounding inflammatory changes. Spleen: Normal in size without focal abnormality. Adrenals/Urinary Tract: Adrenal glands are unremarkable. Kidneys are normal, without  renal calculi, focal lesion, or hydronephrosis. Bladder is unremarkable. Stomach/Bowel: Stomach is within normal limits. Appendix appears normal. No evidence of bowel wall thickening, distention, or inflammatory changes. Scattered diverticula in the descending colon. Vascular/Lymphatic: Aortic atherosclerosis. No enlarged abdominal or pelvic lymph nodes. Reproductive: Prostate is unremarkable. Other: Tiny amount of free fluid in the pelvis. Slight soft tissue  stranding in the perinephric spaces and pericolic gutters. No free air. Musculoskeletal: Bilateral hip prostheses. Chronic fusion of the sacroiliac joints. IMPRESSION: Multiple gallstones with thickening and distention of the gallbladder with slight haziness in the pericholecystic fat which could represent acute or chronic cholecystitis. Small amount of free fluid in the pelvis. Electronically Signed   By: Francene Boyers M.D.   On: 01/02/2018 12:59   Nm Hepatobiliary Liver Func  Result Date: 01/04/2018 CLINICAL DATA:  Abdominal pain. Abnormal appearance of the gallbladder by CT. EXAM: NUCLEAR MEDICINE HEPATOBILIARY IMAGING WITH GALLBLADDER EF TECHNIQUE: Sequential images of the abdomen were obtained out to 60 minutes following intravenous administration of radiopharmaceutical. RADIOPHARMACEUTICALS:  8.03 mCi Tc-34m Choletec IV 2.1 mg of morphine were administered intravenously due to nonvisualization of the gallbladder at 60 minutes. COMPARISON:  None. FINDINGS: Prompt uptake and biliary excretion of activity by the liver is seen. Gallbladder activity is not visualized consistent with occlusion of the cystic duct. Biliary activity passes into small bowel, consistent with patent common bile duct, although there was a delayed radiotracer passage to small bowel, first seen at 65 minutes. Calculated gallbladder ejection fraction was not calculated. IMPRESSION: Nonvisualization of the gallbladder consistent with occlusion at the level of the cystic duct, consistent with acute cholecystitis. Slow progression of radiotracer to the small bowel, consistent with at least partially patent extrahepatic common bile duct. Electronically Signed   By: Ted Mcalpine M.D.   On: 01/04/2018 11:44   Dg Chest Port 1 View  Result Date: 01/04/2018 CLINICAL DATA:  Sepsis. EXAM: PORTABLE CHEST 1 VIEW COMPARISON:  01/02/2018 FINDINGS: The cardiomediastinal silhouette is unchanged. The lungs are mildly hypoinflated. No confluent  airspace opacity, edema, sizable pleural effusion, or pneumothorax is identified. There has been prior internal fixation of a proximal left humerus fracture. IMPRESSION: Hypoinflation.  No evidence of acute airspace disease. Electronically Signed   By: Sebastian Ache M.D.   On: 01/04/2018 06:48    ASSESSMENT / PLAN:  --Severe shock combination of severe septic shock and hypovolemic  shock after dialysis also induced by vasodilation from all of the blood pressure along acute cholecystitis /Bacteremia  B/p is improved without pressor support .   Plan  Hold b/p rx  Labetalol As needed    --Sepsis with bacteremia E. Coli (BC + )  and Enterobacter (BCIP)  we will change antibiotic to meropenem and vancomycin given he has pancreatic and biliary sepsis. D/C Vanc .   --Hemodialysis end-stage renal disease he had dialysis , T/TH/Sat  Last 2/2 .   Plan  Per renal   --Pancreatitis biliary/ HIDA scan 2/3 + cholecystitis   Plan  Gen surgery Dr. Rayburn Ma 2/3 consulted. Marland Kitchen NPO   --History of intracranial hemorrhage 12/07/17  of left-sided weakness   Plan  Neuro following .   --DM   Plan  SSI   --cont SCD  -Change PPI to IV    Suhaan Perleberg NP-C  [facility]  [phone]    01/04/2018, 11:55 AM
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
## 2018-01-04 NOTE — Progress Notes (Signed)
[facility] Progress Note   Subjective:   CT yesterday gallstones/thickening and distension of gallbladder Blood cultures done 2/1 already growing GNR's both bottles and rocephin started In Nuc Med for HIDA  Objective Vitals:   01/04/18 0700 01/04/18 0755 01/04/18 0800 01/04/18 0900  BP: (!) 160/73  (!) 158/75 (!) 178/64  Pulse: 74  74 80  Resp: 14  (!) 21 15  Temp:  98.4 F (36.9 C)    TempSrc:  Oral    SpO2: 95%  98% 96%  Weight:      Height:       Physical Exam  NAD  VS as noted Facial droop Lungs clear Normal heart sounds No murmur Abd + BS. Tender RUQ Dense L hemiparesis (some L thumb movement) Urinary drainage yellow urine (300-550/day) Dialysis Access: L AVF +bruit    Recent Labs  Lab 12/30/17 1225 01/01/18 0814 01/02/18 0402 01/03/18 0547 01/04/18 0357  NA 129* 129* 136 130* 136  K 4.4 4.2 3.4* 4.5 4.9  CL 90* 90* 91* 91* 100*  CO2 20* _0 GLUCOSE 151* 114* 120* 93 98  BUN 56* 43* 25* 46* 26*  CREATININE 8.02* 6.26* 4.85* 6.22* 4.42*  CALCIUM 8.5* 8.9 9.2 8.7* 8.9  PHOS 6.0* 5.0*  --   --   --     Recent Labs  Lab 12/30/17 1225 01/01/18 0814 01/02/18 0402 01/03/18 0547 01/04/18 0357  WBC 9.4 11.9* 9.9 7.9 10.0  NEUTROABS  --   --   --   --  7.7  HGB 12.0* 11.7* 12.9* 11.3* 11.4*  HCT 36.3* 36.0* 39.7 35.0* 36.1*  MCV 91.2 91.4 92.1 90.0 92.6  PLT 313 307 261 268 282   Lipase     Component Value Date/Time   LIPASE 956 (H) 01/04/2018 0357   Hepatic Function Latest Ref Rng & Units 01/04/2018 01/03/2018 01/01/2018  Total Protein 6.5 - 8.1 g/dL 6.1(L) 6.7 -  Albumin 3.5 - 5.0 g/dL 2.7(L) 2.9(L) 3.0(L)  AST 15 - 41 U/L 52(H) 79(H) -  ALT 17 - 63 U/L 48 60 -  Alk Phosphatase 38 - 126 U/L 497(H) 547(H) -  Total Bilirubin 0.3 - 1.2 mg/dL 2.3(H) 3.1(H) -  Bilirubin, Direct 0.1 - 0.5 mg/dL - 2.0(H) -   Recent Labs  Lab 01/03/18 1627 01/03/18 1952 01/03/18 2358 01/04/18 0341 01/04/18 0753  GLUCAP 144* 128* 99 91  71   Results for orders placed or performed during the hospital encounter of 12/07/17  MRSA PCR Screening     Status: None   Collection Time: 12/07/17  6:51 PM  Result Value Ref Range Status   MRSA by PCR NEGATIVE NEGATIVE Final    Comment:        The GeneXpert MRSA Assay (FDA approved for NASAL specimens only), is one component of a comprehensive MRSA colonization surveillance program. It is not intended to diagnose MRSA infection nor to guide or monitor treatment for MRSA infections.   Culture, blood (Routine X 2) w Reflex to ID Panel     Status: None (Preliminary result)   Collection Time: 01/02/18  8:35 AM  Result Value Ref Range Status   Specimen Description BLOOD SITE NOT SPECIFIED  Final   Special Requests   Final    BOTTLES DRAWN AEROBIC AND ANAEROBIC Blood Culture adequate volume   Culture  Setup Time   Final    GRAM NEGATIVE RODS IN BOTH AEROBIC AND ANAEROBIC BOTTLES CRITICAL RESULT CALLED TO, READ BACK  BY AND VERIFIED WITH: G ABOTT PHARMD 01/03/18 1252 JDW    Culture   Final    GRAM NEGATIVE RODS CULTURE REINCUBATED FOR BETTER GROWTH Performed at [facility], [place] [address], [place], [place] [address]    Report Status PENDING  Incomplete  Culture, blood (Routine X 2) w Reflex to ID Panel     Status: Abnormal (Preliminary result)   Collection Time: 01/02/18  8:36 AM  Result Value Ref Range Status   Specimen Description BLOOD SITE NOT SPECIFIED  Final   Special Requests   Final    BOTTLES DRAWN AEROBIC AND ANAEROBIC Blood Culture adequate volume   Culture  Setup Time   Final    GRAM NEGATIVE RODS IN BOTH AEROBIC AND ANAEROBIC BOTTLES CRITICAL RESULT CALLED TO, READ BACK BY AND VERIFIED WITH: G ABOTT PHARMD 01/03/18 1252 JDW    Culture (A)  Final    ESCHERICHIA COLI SUSCEPTIBILITIES TO FOLLOW Performed at [facility], [place] [address] [place], [place] [address]    Report Status PENDING  Incomplete  Blood Culture ID Panel (Reflexed)     Status:  Abnormal   Collection Time: 01/02/18  8:36 AM  Result Value Ref Range Status   Enterococcus species NOT DETECTED NOT DETECTED Final   Listeria monocytogenes NOT DETECTED NOT DETECTED Final   Staphylococcus species NOT DETECTED NOT DETECTED Final   Staphylococcus aureus NOT DETECTED NOT DETECTED Final   Streptococcus species NOT DETECTED NOT DETECTED Final   Streptococcus agalactiae NOT DETECTED NOT DETECTED Final   Streptococcus pneumoniae NOT DETECTED NOT DETECTED Final   Streptococcus pyogenes NOT DETECTED NOT DETECTED Final   Acinetobacter baumannii NOT DETECTED NOT DETECTED Final   Enterobacteriaceae species DETECTED (A) NOT DETECTED Final    Comment: Enterobacteriaceae represent a large family of gram-negative bacteria, not a single organism. CRITICAL RESULT CALLED TO, READ BACK BY AND VERIFIED WITH: G ABOTT PHARMD 01/03/18 1252 JDW    Enterobacter cloacae complex NOT DETECTED NOT DETECTED Final   Escherichia coli DETECTED (A) NOT DETECTED Final    Comment: CRITICAL RESULT CALLED TO, READ BACK BY AND VERIFIED WITH: G ABOTT PHARMD 01/03/18 1252 JDW    Klebsiella oxytoca NOT DETECTED NOT DETECTED Final   Klebsiella pneumoniae NOT DETECTED NOT DETECTED Final   Proteus species NOT DETECTED NOT DETECTED Final   Serratia marcescens NOT DETECTED NOT DETECTED Final   Carbapenem resistance NOT DETECTED NOT DETECTED Final   Haemophilus influenzae NOT DETECTED NOT DETECTED Final   Neisseria meningitidis NOT DETECTED NOT DETECTED Final   Pseudomonas aeruginosa NOT DETECTED NOT DETECTED Final   Candida albicans NOT DETECTED NOT DETECTED Final   Candida glabrata NOT DETECTED NOT DETECTED Final   Candida krusei NOT DETECTED NOT DETECTED Final   Candida parapsilosis NOT DETECTED NOT DETECTED Final   Candida tropicalis NOT DETECTED NOT DETECTED Final  MRSA PCR Screening     Status: None   Collection Time: 01/03/18  4:59 PM  Result Value Ref Range Status   MRSA by PCR NEGATIVE NEGATIVE Final     Comment:        The GeneXpert MRSA Assay (FDA approved for NASAL specimens only), is one component of a comprehensive MRSA colonization surveillance program. It is not intended to diagnose MRSA infection nor to guide or monitor treatment for MRSA infections. Performed at [facility], [place] [address], [place], [place] [address]      Medications: . meropenem (MERREM) IV Stopped (01/03/18 1742)   .  stroke: mapping our early stages of recovery book   Does not apply Once  . aspirin EC  81 mg Oral Daily  . calcitRIOL  0.75 mcg Oral Once per day on Tue Thu Sat  . calcium carbonate  1 tablet Oral Daily  . feeding supplement (NEPRO CARB STEADY)  237 mL Oral BID BM  . insulin aspart  0-9 Units Subcutaneous Q4H  . lanthanum  1,000 mg Oral TID WC  . multivitamin  1 tablet Oral QHS  . pantoprazole  40 mg Oral QHS  . polyethylene glycol  17 g Oral BID  . potassium chloride  40 mEq Oral BID  . senna-docusate  1 tablet Oral BID   Dialysis Orders:  TTS at [facility] (1-Rh) 4hr  65kg New EDW at discharge 2/2bath  AVF  Hep 2000 (NO heparin x 4 wks from bleed/ CVA, 4 wks = Feb 6) - Mircera 145mg IV q 2 weeks (last 1/3) - Calcitriol 0.726m PO q HD Dg Chest Port 1 View  Result Date: 01/04/2018 CLINICAL DATA:  Sepsis. EXAM: PORTABLE CHEST 1 VIEW COMPARISON:  01/02/2018 FINDINGS: The cardiomediastinal silhouette is unchanged. The lungs are mildly hypoinflated. No confluent airspace opacity, edema, sizable pleural effusion, or pneumothorax is identified. There has been prior internal fixation of a proximal left humerus fracture. IMPRESSION: Hypoinflation.  No evidence of acute airspace disease. Electronically Signed   By: AlLogan Bores.D.   On: 01/04/2018 06:48   Assessment/Plan:  1. 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 L facial droop. Only recovery has been L thumb movement.  2. ESRD-HD TTS.  Per neuro wait 4 wks (Feb 6th) before resuming heparin w HD, wait longer if possible. HD  today. 3. Enterobacter and EColi bacteremia. CT w/gallsones, GB thickening. HIDA today. On meropenem. Gallbladder most likely source, urine culture also pending 4. Cholelithiasis with cholecystitis - lipase elevated. LFT's elevated. HIDA today. May need surgical consultation. 5. Anemiaof CKD-Hgb stable.20076mAranesp qHD (Tues) - holding Aranesp at this time 6. MBD of CKD-Continue calcitriol on HD days + binders. Since ileus vs obstr question on previous xrays changed from sevelamer to fosrenol though looking now like was gallbladder issue all along 7. HTN/volume- Unable to accurately determine EDW. Weights have varied al over the place from "28 kg to 70 kg". Pre HD today bed weight 65.5 kg with 2 liter goal. Post was 63.5 then on rehab same day 62.1.   8. Nutrition- Renal diet, rena-vite,Nepro TID.  9. Type 2 DM - per primary   CynJamal MaesD CarJohn C [id number] 01/04/2018, 10:40 AM

## 2018-01-04 NOTE — Progress Notes (Signed)
Pt transported to nuclear medicine at this time.

## 2018-01-04 NOTE — Progress Notes (Signed)
Dr. Delton Coombes states to hold all PO medications that are due now (10am) until his nuclear medicine scan has resulted. Pt still NPO at this time.

## 2018-01-04 NOTE — Progress Notes (Signed)
The patient has returned from nuclear medicine.

## 2018-01-04 NOTE — Progress Notes (Signed)
STROKE TEAM DAILY PROGRESS NOTE   SUBJECTIVE No family is at bedside. He is lying in bed comfortably. Just had HIDA scan and concerning for acute cholecystitis. BP much improved. Off IVF. No abdominal pain or N/V. No more fever after yesterday afternoon one spiking fever. He is asking for water and food. Discussed with Dr. Lamonte Sakai.   Physical Examination: Vitals:   01/04/18 0400 01/04/18 0500 01/04/18 0600 01/04/18 0700  BP: (!) 156/49 (!) 156/54 (!) 149/70 (!) 160/73  Pulse: 75 72 73 74  Resp: 15 14 14 14   Temp:      TempSrc:      SpO2: 95% 95% 95% 95%  Weight:      Height:       HEENT-  Normocephalic,  Cardiovascular - Regular rate and rhythm, + murmur Respiratory - Lungs clear bilaterally. Non-labored breathing, No wheezing. Abdomen - soft and tender, BS normal, complaining of pain with palpation Extremities- no edema or cyanosis Skin-warm and dry  Neurological Examination Mental Status: Awake,oriented x 3  Speech is slightly dysarthric. Able to follow 3 step commands without difficulty. Cranial Nerves: II: Left field cut. Pupils are equal, round, and reactive to light.   III,IV, VI: EOMI without ptosis or diploplia. No nystagmus.   V: Facial sensation is decreased on the Left VII: Facial movement is decreased on the Left VIII: hearing is intact to voice X: Uvula elevates symmetrically XI: Shoulder shrug is decreased on the left XII: tongue is midline without atrophy or fasciculations.  Motor: Tone is normal. Bulk is normal. 5/5 strength on the Right.  Dense left hemiplegia and Strength is 1/5 on the Left leg and 0-1/5 in left  arm   Sensor: Sensation is decreased on the Left in the arms and legs. Normal on the Right Deep Tendon Reflexes: 1+ and symmetric throughout in the biceps and patellae Plantars: Toes are downgoing bilaterally.  Cerebellar: unable to perform finger-to-nose on Left Gait: Truncal ataxia to Left   Lab Results: CBC: Recent Labs   Lab 01/02/18 0402 01/03/18 0547 01/04/18 0357  WBC 9.9 7.9 10.0  HGB 12.9* 11.3* 11.4*  HCT 39.7 35.0* 36.1*  MCV 92.1 90.0 92.6  PLT 261 268 409   Basic Metabolic Panel: Recent Labs  Lab 12/30/17 1225 01/01/18 0814 01/02/18 0402 01/03/18 0547 01/04/18 0357  NA 129* 129* 136 130* 136  K 4.4 4.2 3.4* 4.5 4.9  CL 90* 90* 91* 91* 100*  CO2 20* 24 25 24 23   GLUCOSE 151* 114* 120* 93 98  BUN 56* 43* 25* 46* 26*  CREATININE 8.02* 6.26* 4.85* 6.22* 4.42*  CALCIUM 8.5* 8.9 9.2 8.7* 8.9  MG  --   --   --   --  1.9  PHOS 6.0* 5.0*  --   --   --    Liver Function Tests: Recent Labs  Lab 12/30/17 1225 01/01/18 0814 01/03/18 1043 01/04/18 0357  AST  --   --  79* 52*  ALT  --   --  60 48  ALKPHOS  --   --  547* 497*  BILITOT  --   --  3.1* 2.3*  PROT  --   --  6.7 6.1*  ALBUMIN 3.5 3.0* 2.9* 2.7*   Imaging: I have personally reviewed the radiological images below and agree with the radiology interpretations.   NM Hepatobiliary Scan Including Gall Bladder  01/04/2018 Nonvisualization of the gallbladder consistent with occlusion at the level of the cystic duct, consistent with acute cholecystitis.  Slow progression of radiotracer to the small bowel, consistent with at least partially patent extrahepatic common bile duct.  DG Chest Portable 1 View 01/04/2018 IMPRESSION: Hypoinflation.  No evidence of acute airspace disease.   Ct Head Code Stroke Wo Contrast Result Date: 12/07/2017 IMPRESSION:  1. Acute right thalamic hemorrhage. Mild edema and local mass effect.  2. 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  3. New age indeterminate left thalamic lacunar infarct.    Ct Head Wo Contrast 12/27/2017  IMPRESSION: Recent right thalamic intraparenchymal hematoma is now isodense to edematous brain.  No evidence of additional bleeding. Old hemorrhagic infarction in the right basal ganglia. Resolution of intraventricular  blood.  Ventricular size stable.  DG Chest Port 1 View 12/26/2017 IMPRESSION: No active disease.  Ct Angio Head W Or Wo Contrast Result Date: 12/07/2017 IMPRESSION: 1. No aneurysm, vascular malformation, or spot sign associated with the right thalamic hemorrhage. 2. Patent circle of Willis with intracranial atherosclerosis as above.  Moderate to severe right PCA stenoses.   Ct Head Wo Contrast Result Date: 12/08/2017 IMPRESSION:  1. Stable right thalamic hematoma and small volume of hemorrhage in right lateral ventricle.  2. Mild increase of mass effect with 5 mm right-to-left midline shift, previously 4 mm.  3. No new acute intracranial abnormality identified.   ECHO  12/12/2017 Study Conclusions - Left ventricle: The cavity size was normal. There was moderate   concentric hypertrophy. Systolic function was normal. The   estimated ejection fraction was in the range of 55% to 60%. Wall   motion was normal; there were no regional wall motion   abnormalities. There was a reduced contribution of atrial   contraction to ventricular filling, due to increased ventricular   diastolic pressure or atrial contractile dysfunction. Doppler   parameters are consistent with a reversible restrictive pattern,   indicative of decreased left ventricular diastolic compliance   and/or increased left atrial pressure (grade 3 diastolic   dysfunction). Doppler parameters are consistent with high   ventricular filling pressure. - Aortic valve: Trileaflet; mildly thickened, mildly calcified   leaflets. Moderate focal calcification involving the left   coronary cusp. - Aorta: Ascending aorta diameter: 40 mm (ED). - Ascending aorta: The ascending aorta was mildly dilated. - Mitral valve: Calcified annulus. There was mild regurgitation. - Left atrium: The atrium was mildly dilated. - Right ventricle: The cavity size was mildly dilated. Wall   thickness was normal. - Tricuspid valve: There was trivial  regurgitation  Carotid Duplex           12/11/2017 Final Interpretation: Right Carotid:  The extracranial vessels were near-normal with only minimal wall thickening or plaque. Left Carotid: The extracranial vessels were near-normal with only minimal wall thickening or plaque. Vertebrals: Left vertebral artery was patent with antegrade flow.  Right vertebral artery was not visualized. Subclavians: Normal flow hemodynamics were seen in the right subclavian artery.  Left subclavian artery exhibits monophasic flow, suggestive of possbile proximal obstruction  ABDOMEN - 1 VIEW      12/30/2017 11:34 IMPRESSION: Air-filled loops of small and large bowel noted suggesting adynamic ileus. Follow-up exam suggested to exclude bowel obstruction.  ABDOMEN - 1 VIEW      12/31/2017 09:48 IMPRESSION No evidence of bowel obstruction or ileus.  PORTABLE CHEST 1 VIEW 01/02/2018 08:42 IMPRESSION: No evidence of acute cardiopulmonary disease  CT Abdomen/Pelvis 01/02/2018 Multiple gallstones with thickening and distention of the gallbladder with slight haziness in the pericholecystic fat which  could represent acute or chronic cholecystitis. Small amount of free fluid in the pelvis.      IMPRESSION: Wesley Burns is a 52 y.o. male with PMH of ESRD on Dialysis, HTN, Hx of CVA without deficits, BPH and Depression that presents to the ED as a code stroke with reports of acute onset Left sided weakness, Left facial droop, decreased sensation on the Left, slightly dysarthric speech. . EMS reports that patient fell out of his bed at approximately 15:40. Patients room mate states that he last saw him normal at 07:00 this morning. CT Head reveals:  Acute right thalamic hemorrhage with mild cytotoxic edema New age indeterminate left thalamic lacunar infarct  Suspected Etiology: HTN hemorrhage and small vessel disease Resultant Symptoms: Left sided weakness, Left facial droop, decreased sensation on  Left Stroke Risk Factors: hypertension, ESRD on Dialysis Other Stroke Risk Factors: Advanced age, ?ETOH use, Hx stroke  Outstanding Stroke Work-up Studies:   Work up completed at this time  12/30/2017: Patient had large bowel movement overnight, complaining of abdominal pain this morning.  He will be showed air loops, suggesting adynamic ileus.  GI consult recommended NG tube for suctioning, n.p.o. with KUB in a.m.  Due to ESRD on HD, no IV fluids per nephrology.  Patient refused NG tube so far, currently no abdominal pain, no nausea vomiting.  Patient stated that he feels okay.  However we will continue n.p.o. except meds and ice chips, with no IV fluid and KUB in a.m.  Neurologically stable.  12/31/2017: Repeat Abdominal Xray shows no bowel obstruction or ileus today. Patient denies any abdominal pain on exam today. Diet advanced and bowel regime in progress. Remains neurologically stable. HD in AM. Awaiting admission decision from outpatient HD facility for transfer to SNF in [place]. Patient and partner updated on POC.   01/01/2018: This afternoon complains of abdominal pain & Nausea after HD. Abdominal Xray 1/31 - Negative. Diet advanced to clear liquids. Last BM on 1/29. Dulcolax suppository ordered. Will Advance diet to clears for now. Remains neurologically stable. Discharge to SNF in [place] planned for Sunday  01/02/2018: No complaint of abdominal pain today. +vomiting and nausea overnight. +BM after Dulcolax suppository. TMax 101.2. Will obtain CT Abdomen/Pelvis today. Bl Cx pending. Continue with clear liquid diet for now. 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 No obstruction or ileus No abdominal pain & Nausea today, +BM last PM  Abdominal Xray 1/31 - Negative. Diet advanced to clear liquids  Constipation +BM documented last PM after Dulcolax suppository Miralax and Senna in progress  ESRD on Dialysis - HD  (Saturday)  Stable, Nephrology following, Appreciate  Assistance- HD in AM  HD today  Creatinine - 4.85  Anemia Chronic  Stable, trending up slowly   Iron Studies in progress  Nephrology to start ESA, IV Iron, Aranesp qHD  Repeat labs in AM  Hyperkalemia Stable - HD  - K+3.4 Repeat labs in AM  Hyponatremia  Stable, Nephrology following  Na 129   Hypocalcemia - Secondary Hyperparathyroidism  Stable, Nephrology following   9.2  Elevated Alk Phos  Stable, Nephrology following  12/17/2017 - 253  Elevated Phosphorus  Stable, Nephrology following 5.0  Leg Spasms  Zanaflex BID PRN  Nausea Zofran PRN Will continue to monitor closely  Diarrhea- Resolved  Imodium PRN  HYPERTENSION:  Stable, some elevated B/P's noted overnight  SBP goal less than 140/90 most of the time  Clonidine dose increased to 0.2 mg BID  Labetolol PRN  Long term BP goal normotensive.  Patient currently on: Norvasc, Hydralazine  HYPERLIPIDEMIA  Home Meds:  NONE  LDL  goal < 70, LDL 73  Continue  Lipitor 10 mg  Continue statin at discharge  DIABETES:  HgbA1c goal < 7.0, HgbA1C = 4.0  Currently on:  Novolog  Continue CBG monitoring and SSI to maintain glucose 140-180 mg/dl  DM education   Other Active Problems: Principal Problem:   E coli and enterobacter bacteremia Active Problems:   IVH (intraventricular hemorrhage) (HCC)   Dysphagia, post-stroke   Type 2 diabetes mellitus with peripheral neuropathy (HCC)   History of tobacco abuse   Benign essential HTN   ESRD on dialysis ([place])   History of traumatic brain injury   History of CVA with residual deficit   History of transmetatarsal amputation of foot (HCC)   ICH (intracerebral hemorrhage) (HCC)   Cholelithiasis with cholecystitis   Septic shock ([place])   Abdominal pain   Acute biliary pancreatitis    Hospital day # 28 VTE prophylaxis: SCD's  Diet - Fall precautions Diet NPO time specified   FAMILY UPDATES:  family at bedside  TEAM UPDATES:Wesley Deacon, MD   Prior Home Stroke Medications:  No antithrombotic  Discharge Stroke Meds:  Please discharge patient on TBD , will likely add ASA prior to discharge  Disposition: [place]:   [id number], Discussion with MSW,                                 Discharge to [facility] now scheduled for Sunday  Follow Up:  Follow-up Information    Garvin Fila, MD. Schedule an appointment as soon as possible for a visit in 6 week(s).   Specialties:  Neurology, Radiology Contact information: [address] [place] [address] [place], [place], MD -PCP Follow up in 1-2 weeks      Assessment & plan discussed with with attending physician and they are in agreement.     ATTENDING NOTE: I reviewed above note and agree with the assessment and plan. I have made any additions or clarifications directly to the above note. Pt was seen and examined.   52 year old male with history of ESRD on hemodialysis, HTN, CVA  without deficit, BPH, depression admitted for left-sided weakness and numbness, left facial droop.  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  CTA head showed right PCA stenosis but no AVM or aneurysm.  Repeat CT stable.  EF 55-60%, carotid Doppler negative.  A1c 4.0 and LDL 73. Still has left hemiplgia but CT head on 12/27/17 showed right thalamic ICH reabsorbed, started him on ASA 55m. PT OT recommended SNF.  Patient BP improved with Norvasc and clonidine and added hydralazine. Pt developed abdominal pain and nausea since 12/30/17, put on NPO, and repeat 12/31/17 symptom resolved and KUB normal. Resumed diet. Had spiking fever and again abdominal pain.  Abdominal  CT showed possible acute vs. Chronic cholecystitis and blood culture showed E. Coli bacteremia. Had IM hospitalist consult and Found to have elevated LFTs and lipase, concerning for biliary pancreatitis. Kept NPO, and started on ampicillin. He did not have adequate po for the last several days and after HD on 01/03/18 while on ampicillin infusion, he developed altered mental status with diffuse diaphoresis, hypotensive SBP 60s, lack of responsive, hypoxia with O2 sat down to 90, and tachycardia at 110s. Put pt bed flat, started IV bolus, put on non-rebreather. Pt gradually more responsive, although still lethargic. Denies abdominal pain. SBP up to 70s. CCM consulted and transferred to ICU. BP getting improved to 120s, but have spike fever at 101.3. Today he is back to baseline, no abdominal pain, no more fever, N/V. However, HIDA scan again concerning for acute cholecystitis. He is now on meropenem.   Pt condition change most likely hypotensive event likely due to dehydration on HD, septic shock with fever and bacteremia in the setting of biliary pancreatitis and acute cholecystitis. Appreciate CCM and IM help.   Neurology will sign off. Please call with questions. [facility]  Thanks for the consult.  JRosalin Hawking MD PhD Stroke Neurology 01/04/2018 12:48 PM  This patient is critically ill due to acute hypotension, septic shock, dehydration on HD, biliary pancreatitis, cholecystitis and at significant risk of neurological worsening, death form sepsis, heart failure, cardiac arrest, hypotension. This patient's care requires constant monitoring of vital signs, hemodynamics, respiratory and cardiac monitoring, review of multiple databases, neurological assessment, discussion with family, other specialists and medical decision making of high complexity. I spent 35 minutes of neurocritical care time in the care of this patient.     Please page stroke NP  Or  PA  Or MD from 8am -4 pm  as this patient from this time will be  followed by the stroke.   You can look them up on [email or web address]  Password [email or web address]

## 2018-01-05 ENCOUNTER — Encounter (HOSPITAL_COMMUNITY): Payer: Self-pay | Admitting: General Surgery

## 2018-01-05 ENCOUNTER — Inpatient Hospital Stay (HOSPITAL_COMMUNITY): Payer: Self-pay

## 2018-01-05 DIAGNOSIS — K8012 Calculus of gallbladder with acute and chronic cholecystitis without obstruction: Secondary | ICD-10-CM

## 2018-01-05 HISTORY — PX: IR PERC CHOLECYSTOSTOMY: IMG2326

## 2018-01-05 LAB — CBC WITH DIFFERENTIAL/PLATELET
BASOS ABS: 0 10*3/uL (ref 0.0–0.1)
BASOS PCT: 0 %
EOS PCT: 5 %
Eosinophils Absolute: 0.4 10*3/uL (ref 0.0–0.7)
HCT: 35.1 % — ABNORMAL LOW (ref 39.0–52.0)
Hemoglobin: 11.1 g/dL — ABNORMAL LOW (ref 13.0–17.0)
Lymphocytes Relative: 22 %
Lymphs Abs: 1.6 10*3/uL (ref 0.7–4.0)
MCH: 28.8 pg (ref 26.0–34.0)
MCHC: 31.6 g/dL (ref 30.0–36.0)
MCV: 91.2 fL (ref 78.0–100.0)
Monocytes Absolute: 0.7 10*3/uL (ref 0.1–1.0)
Monocytes Relative: 10 %
Neutro Abs: 4.6 10*3/uL (ref 1.7–7.7)
Neutrophils Relative %: 63 %
PLATELETS: 275 10*3/uL (ref 150–400)
RBC: 3.85 MIL/uL — AB (ref 4.22–5.81)
RDW: 18.9 % — AB (ref 11.5–15.5)
WBC: 7.3 10*3/uL (ref 4.0–10.5)

## 2018-01-05 LAB — GLUCOSE, CAPILLARY
GLUCOSE-CAPILLARY: 82 mg/dL (ref 65–99)
Glucose-Capillary: 120 mg/dL — ABNORMAL HIGH (ref 65–99)
Glucose-Capillary: 125 mg/dL — ABNORMAL HIGH (ref 65–99)
Glucose-Capillary: 128 mg/dL — ABNORMAL HIGH (ref 65–99)
Glucose-Capillary: 91 mg/dL (ref 65–99)
Glucose-Capillary: 94 mg/dL (ref 65–99)

## 2018-01-05 LAB — CBC
HEMATOCRIT: 40.3 % (ref 39.0–52.0)
Hemoglobin: 12.9 g/dL — ABNORMAL LOW (ref 13.0–17.0)
MCH: 29.2 pg (ref 26.0–34.0)
MCHC: 32 g/dL (ref 30.0–36.0)
MCV: 91.2 fL (ref 78.0–100.0)
PLATELETS: 291 10*3/uL (ref 150–400)
RBC: 4.42 MIL/uL (ref 4.22–5.81)
RDW: 18.4 % — AB (ref 11.5–15.5)
WBC: 8 10*3/uL (ref 4.0–10.5)

## 2018-01-05 LAB — CULTURE, BLOOD (ROUTINE X 2)
SPECIAL REQUESTS: ADEQUATE
SPECIAL REQUESTS: ADEQUATE

## 2018-01-05 LAB — COMPREHENSIVE METABOLIC PANEL
ALT: 53 U/L (ref 17–63)
AST: 68 U/L — AB (ref 15–41)
Albumin: 2.8 g/dL — ABNORMAL LOW (ref 3.5–5.0)
Alkaline Phosphatase: 663 U/L — ABNORMAL HIGH (ref 38–126)
Anion gap: 14 (ref 5–15)
BILIRUBIN TOTAL: 1.7 mg/dL — AB (ref 0.3–1.2)
BUN: 39 mg/dL — ABNORMAL HIGH (ref 6–20)
CO2: 22 mmol/L (ref 22–32)
Calcium: 8.7 mg/dL — ABNORMAL LOW (ref 8.9–10.3)
Chloride: 97 mmol/L — ABNORMAL LOW (ref 101–111)
Creatinine, Ser: 5.85 mg/dL — ABNORMAL HIGH (ref 0.61–1.24)
GFR, EST AFRICAN AMERICAN: 12 mL/min — AB (ref >60)
GFR, EST NON AFRICAN AMERICAN: 10 mL/min — AB (ref >60)
Glucose, Bld: 79 mg/dL (ref 65–99)
POTASSIUM: 4.8 mmol/L (ref 3.5–5.1)
Sodium: 133 mmol/L — ABNORMAL LOW (ref 135–145)
TOTAL PROTEIN: 6 g/dL — AB (ref 6.5–8.1)

## 2018-01-05 LAB — PHOSPHORUS: PHOSPHORUS: 2.9 mg/dL (ref 2.5–4.6)

## 2018-01-05 LAB — LIPASE, BLOOD: LIPASE: 658 U/L — AB (ref 11–51)

## 2018-01-05 MED ORDER — FENTANYL CITRATE (PF) 100 MCG/2ML IJ SOLN
25.0000 ug | INTRAMUSCULAR | Status: DC | PRN
  Administered 2018-01-05: 100 ug via INTRAVENOUS
  Administered 2018-01-05: 75 ug via INTRAVENOUS
  Administered 2018-01-05 – 2018-01-07 (×7): 100 ug via INTRAVENOUS
  Filled 2018-01-05 (×9): qty 2

## 2018-01-05 MED ORDER — MIDAZOLAM HCL 2 MG/2ML IJ SOLN
INTRAMUSCULAR | Status: AC | PRN
  Administered 2018-01-05 (×2): 1 mg via INTRAVENOUS

## 2018-01-05 MED ORDER — PANTOPRAZOLE SODIUM 40 MG PO TBEC
40.0000 mg | DELAYED_RELEASE_TABLET | Freq: Every day | ORAL | Status: DC
  Administered 2018-01-06 – 2018-01-10 (×5): 40 mg via ORAL
  Filled 2018-01-05 (×6): qty 1

## 2018-01-05 MED ORDER — LIDOCAINE HCL (PF) 1 % IJ SOLN
INTRAMUSCULAR | Status: AC | PRN
  Administered 2018-01-05: 5 mL

## 2018-01-05 MED ORDER — CIPROFLOXACIN IN D5W 400 MG/200ML IV SOLN
INTRAVENOUS | Status: AC
  Administered 2018-01-05: 400 mg via INTRAVENOUS
  Filled 2018-01-05: qty 200

## 2018-01-05 MED ORDER — IOPAMIDOL (ISOVUE-300) INJECTION 61%
INTRAVENOUS | Status: AC
  Administered 2018-01-05: 10 mL
  Filled 2018-01-05: qty 50

## 2018-01-05 MED ORDER — FENTANYL CITRATE (PF) 100 MCG/2ML IJ SOLN
INTRAMUSCULAR | Status: AC | PRN
  Administered 2018-01-05 (×2): 50 ug via INTRAVENOUS

## 2018-01-05 MED ORDER — LIDOCAINE HCL 1 % IJ SOLN
INTRAMUSCULAR | Status: AC
  Filled 2018-01-05: qty 20

## 2018-01-05 MED ORDER — LABETALOL HCL 5 MG/ML IV SOLN
5.0000 mg | INTRAVENOUS | Status: DC | PRN
  Administered 2018-01-07 – 2018-01-08 (×4): 10 mg via INTRAVENOUS
  Filled 2018-01-05 (×5): qty 4

## 2018-01-05 MED ORDER — FENTANYL CITRATE (PF) 100 MCG/2ML IJ SOLN
INTRAMUSCULAR | Status: AC
  Filled 2018-01-05: qty 4

## 2018-01-05 MED ORDER — CIPROFLOXACIN IN D5W 400 MG/200ML IV SOLN
400.0000 mg | Freq: Once | INTRAVENOUS | Status: AC
  Administered 2018-01-05: 400 mg via INTRAVENOUS
  Filled 2018-01-05: qty 200

## 2018-01-05 MED ORDER — DEXTROSE 5 % IV SOLN
2.0000 g | INTRAVENOUS | Status: DC
  Administered 2018-01-05 – 2018-01-09 (×5): 2 g via INTRAVENOUS
  Filled 2018-01-05 (×6): qty 2

## 2018-01-05 MED ORDER — MIDAZOLAM HCL 2 MG/2ML IJ SOLN
INTRAMUSCULAR | Status: AC
  Filled 2018-01-05: qty 4

## 2018-01-05 NOTE — Progress Notes (Signed)
Report called to [phone]

## 2018-01-05 NOTE — Sedation Documentation (Signed)
Patient is resting comfortably. 
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
## 2018-01-05 NOTE — Progress Notes (Signed)
Name: Wesley Burns MRN: [id number] DOB: 1966/07/27    ADMISSION DATE:  12/07/2017 CONSULTATION DATE: January 03, 2018  REFERRING MD : Neurology service  CHIEF COMPLAINT: Severe low blood pressure.  Mental status drowsiness  BRIEF PATIENT DESCRIPTION: Was called for altered mental status severe low blood pressure shock  SIGNIFICANT EVENTS  Patient blood pressure soft after dialysis  STUDIES:  CT A / P 2/1 > multiple gallstones with thickening and distention of the GB. HIDA 2/3 > non visualization of the GB c/w occlusion at the level of the cystic duct, c/w acute cholecystitis.   HISTORY OF PRESENT ILLNESS: Very complex past medical history 52 year old Caucasian male who is here with intracranial hemorrhage almost a month ago with left-sided weakness was seen by neurology he also had end-stage renal disease diabetes and hypertension during his stay he developed pancreatitis bacteremia transaminitis and increased bilirubin today patient had dialysis after dialysis he started having severe hypertension to the degree that his blood pressure was in the 50s or 60s while at the time I get to him patient was drowsy lethargic started giving him a fluid bolus of 500 cc transfer him today and if the patient also has increase in his bilirubin he has bacteremia with E. coli and Enterobacter he was given ampicillin sulbactam and Flagyl which was DC'd and changed to meropenem and vancomycin patient has been n.p.o. for several days given his generalized abdominal pain not eating good.  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  SUBJECTIVE:  No complaints.  IR planning for perc chole drain placement this morning.  VITAL SIGNS: Temp:  [98.4 F (36.9 C)-99.4 F (37.4 C)] 98.4 F (36.9 C) (02/04 0400) Pulse Rate:  [68-88] 69 (02/04 1045) Resp:  [12-23] 13 (02/04 1045) BP: (133-168)/(67-83) 144/79 (02/04 1045) SpO2:  [93 %-98 %] 94 % (02/04 1000) Weight:  [67.1 kg (147 lb  14.9 oz)] 67.1 kg (147 lb 14.9 oz) (02/04 0600)   Exam :  GEN: Middle aged male, in NAD NEURO: A/O x 3, left sided weakness HEENT:  [place]/AT, Dry mucosa  RESP:  Normal respiratory effort, CTAB CARD:  RRR, no M/R/G GI:  BS x 4, mild tenderness epigastrium, no rebound or guarding MSK: No deformities, left sided weakness  SKIN: Warm, no lesions or rashes     Recent Labs  Lab 01/03/18 0547 01/04/18 0357 01/05/18 0330  NA 130* 136 133*  K 4.5 4.9 4.8  CL 91* 100* 97*  CO2 24 23 22   BUN 46* 26* 39*  CREATININE 6.22* 4.42* 5.85*  GLUCOSE 93 98 79   Recent Labs  Lab 01/03/18 0547 01/04/18 0357 01/05/18 0330  HGB 11.3* 11.4* 11.1*  HCT 35.0* 36.1* 35.1*  WBC 7.9 10.0 7.3  PLT 268 282 275   Nm Hepatobiliary Liver Func  Addendum Date: 01/04/2018   ADDENDUM REPORT: 01/04/2018 11:57 ADDENDUM: These results were called by telephone at the time of interpretation on 01/04/2018 at 11:45 am to NP 03/04/2018, who verbally acknowledged these results. Electronically Signed   By: Blase Mess M.D.   On: 01/04/2018 11:57   Result Date: 01/04/2018 CLINICAL DATA:  Abdominal pain. Abnormal appearance of the gallbladder by CT. EXAM: NUCLEAR MEDICINE HEPATOBILIARY IMAGING WITH GALLBLADDER EF TECHNIQUE: Sequential images of the abdomen were obtained out to 60 minutes following intravenous administration of radiopharmaceutical. RADIOPHARMACEUTICALS:  8.03 mCi Tc-18m Choletec IV 2.1 mg of morphine were administered intravenously due to nonvisualization of the gallbladder at 60  minutes. COMPARISON:  None. FINDINGS: Prompt uptake and biliary excretion of activity by the liver is seen. Gallbladder activity is not visualized consistent with occlusion of the cystic duct. Biliary activity passes into small bowel, consistent with patent common bile duct, although there was a delayed radiotracer passage to small bowel, first seen at 65 minutes. Calculated gallbladder ejection fraction was not calculated.  IMPRESSION: Nonvisualization of the gallbladder consistent with occlusion at the level of the cystic duct, consistent with acute cholecystitis. Slow progression of radiotracer to the small bowel, consistent with at least partially patent extrahepatic common bile duct. Electronically Signed: By: Ted Mcalpine M.D. On: 01/04/2018 11:44   Dg Chest Port 1 View  Result Date: 01/04/2018 CLINICAL DATA:  Sepsis. EXAM: PORTABLE CHEST 1 VIEW COMPARISON:  01/02/2018 FINDINGS: The cardiomediastinal silhouette is unchanged. The lungs are mildly hypoinflated. No confluent airspace opacity, edema, sizable pleural effusion, or pneumothorax is identified. There has been prior internal fixation of a proximal left humerus fracture. IMPRESSION: Hypoinflation.  No evidence of acute airspace disease. Electronically Signed   By: Sebastian Ache M.D.   On: 01/04/2018 06:48    ASSESSMENT / PLAN:  Shock - combination of septic shock from E.coli and enterobacter bacteremia (presumed from acute cholecystitis) and hypovolemic shock after dialysis. Hx HTN. Plan: Continue meropenem. Continue Labetalol PRN, goal SBP < 140 per neuro.   Acute cholecystitis - felt to be poor candidate for surgery as would be high risk for general anesthesia given recent CVA.  Surgery recommending IR to place perc chole. Plan: IR planning for per chole placement this morning 2/4.  ESRD (T/TH/Sat). Plan: Renal following.  Recent ICH (12/07/17). Plan: Neuro following.  Hx DM II. Plan: Continue SSI.   Rutherford Guys, [place] [facility] Pager: [phone]  or [phone] 01/05/2018, 11:39 AM

## 2018-01-05 NOTE — Progress Notes (Signed)
Patient transported to 3W16 via bed he tolerated well

## 2018-01-05 NOTE — Progress Notes (Signed)
Subjective/Chief Complaint: Denies pain   Objective: Vital signs in last 24 hours: Temp:  [98.4 F (36.9 C)-99.4 F (37.4 C)] 98.4 F (36.9 C) (02/04 0400) Pulse Rate:  [68-88] 76 (02/04 1235) Resp:  [12-23] 20 (02/04 1235) BP: (133-168)/(67-91) 151/86 (02/04 1235) SpO2:  [92 %-98 %] 92 % (02/04 1235) Weight:  [67.1 kg (147 lb 14.9 oz)] 67.1 kg (147 lb 14.9 oz) (02/04 0600) Last BM Date: 01/04/18  Intake/Output from previous day: 02/03 0701 - 02/04 0700 In: 290 [P.O.:240; IV Piggyback:50] Out: 50 [Urine:50] Intake/Output this shift: Total I/O In: -  Out: 50 [Drains:50]  GI: slight RUQ tenderness  Lab Results:  Recent Labs    01/04/18 0357 01/05/18 0330  WBC 10.0 7.3  HGB 11.4* 11.1*  HCT 36.1* 35.1*  PLT 282 275   BMET Recent Labs    01/04/18 0357 01/05/18 0330  NA 136 133*  K 4.9 4.8  CL 100* 97*  CO2 23 22  GLUCOSE 98 79  BUN 26* 39*  CREATININE 4.42* 5.85*  CALCIUM 8.9 8.7*   PT/INR Recent Labs    01/04/18 0357  LABPROT 14.4  INR 1.13   ABG No results for input(s): PHART, HCO3 in the last 72 hours.  Invalid input(s): PCO2, PO2  Studies/Results: Nm Hepatobiliary Liver Func  Addendum Date: 01/04/2018   ADDENDUM REPORT: 01/04/2018 11:57 ADDENDUM: These results were called by telephone at the time of interpretation on 01/04/2018 at 11:45 am to NP Blase Mess, who verbally acknowledged these results. Electronically Signed   By: Ted Mcalpine M.D.   On: 01/04/2018 11:57   Result Date: 01/04/2018 CLINICAL DATA:  Abdominal pain. Abnormal appearance of the gallbladder by CT. EXAM: NUCLEAR MEDICINE HEPATOBILIARY IMAGING WITH GALLBLADDER EF TECHNIQUE: Sequential images of the abdomen were obtained out to 60 minutes following intravenous administration of radiopharmaceutical. RADIOPHARMACEUTICALS:  8.03 mCi Tc-51m Choletec IV 2.1 mg of morphine were administered intravenously due to nonvisualization of the gallbladder at 60 minutes. COMPARISON:   None. FINDINGS: Prompt uptake and biliary excretion of activity by the liver is seen. Gallbladder activity is not visualized consistent with occlusion of the cystic duct. Biliary activity passes into small bowel, consistent with patent common bile duct, although there was a delayed radiotracer passage to small bowel, first seen at 65 minutes. Calculated gallbladder ejection fraction was not calculated. IMPRESSION: Nonvisualization of the gallbladder consistent with occlusion at the level of the cystic duct, consistent with acute cholecystitis. Slow progression of radiotracer to the small bowel, consistent with at least partially patent extrahepatic common bile duct. Electronically Signed: By: Ted Mcalpine M.D. On: 01/04/2018 11:44   Dg Chest Port 1 View  Result Date: 01/04/2018 CLINICAL DATA:  Sepsis. EXAM: PORTABLE CHEST 1 VIEW COMPARISON:  01/02/2018 FINDINGS: The cardiomediastinal silhouette is unchanged. The lungs are mildly hypoinflated. No confluent airspace opacity, edema, sizable pleural effusion, or pneumothorax is identified. There has been prior internal fixation of a proximal left humerus fracture. IMPRESSION: Hypoinflation.  No evidence of acute airspace disease. Electronically Signed   By: Sebastian Ache M.D.   On: 01/04/2018 06:48    Anti-infectives: Anti-infectives (From admission, onward)   Start     Dose/Rate Route Frequency Ordered Stop   01/05/18 1230  ciprofloxacin (CIPRO) IVPB 400 mg     400 mg 200 mL/hr over 60 Minutes Intravenous  Once 01/05/18 1152 01/05/18 1307   01/03/18 1600  meropenem (MERREM) 500 mg in sodium chloride 0.9 % 50 mL IVPB    Comments:  Pharmacy to adjust the dose   500 mg 100 mL/hr over 30 Minutes Intravenous Every 24 hours 01/03/18 1523     01/03/18 1545  vancomycin (VANCOCIN) 1,500 mg in sodium chloride 0.9 % 500 mL IVPB     1,500 mg 250 mL/hr over 120 Minutes Intravenous  Once 01/03/18 1540 01/03/18 1943   01/03/18 1400  Ampicillin-Sulbactam  (UNASYN) 3 g in sodium chloride 0.9 % 100 mL IVPB  Status:  Discontinued     3 g 200 mL/hr over 30 Minutes Intravenous Every 12 hours 01/03/18 1050 01/03/18 1523   01/03/18 1200  metroNIDAZOLE (FLAGYL) IVPB 500 mg  Status:  Discontinued     500 mg 100 mL/hr over 60 Minutes Intravenous Every 8 hours 01/03/18 1049 01/03/18 1526   01/03/18 1100  Ampicillin-Sulbactam (UNASYN) 3 g in sodium chloride 0.9 % 100 mL IVPB  Status:  Discontinued     3 g 200 mL/hr over 30 Minutes Intravenous Every 6 hours 01/03/18 1049 01/03/18 1050   01/03/18 0200  cefTRIAXone (ROCEPHIN) 2 g in dextrose 5 % 50 mL IVPB  Status:  Discontinued     2 g 100 mL/hr over 30 Minutes Intravenous Daily at bedtime 01/03/18 0126 01/03/18 1048      Assessment/Plan: Patient Active Problem List   Diagnosis Date Noted  . E coli and enterobacter bacteremia 01/03/2018  . Cholelithiasis with cholecystitis 01/03/2018  . Septic shock (HCC) 01/03/2018  . Abdominal pain   . Acute biliary pancreatitis   . ICH (intracerebral hemorrhage) (HCC) 12/17/2017  . Dysphagia, post-stroke   . Type 2 diabetes mellitus with peripheral neuropathy (HCC)   . History of tobacco abuse   . Benign essential HTN   . ESRD on dialysis (HCC)   . History of traumatic brain injury   . History of CVA with residual deficit   . History of transmetatarsal amputation of foot (HCC)   . IVH (intraventricular hemorrhage) (HCC) 12/07/2017  . Complication of vascular dialysis catheter 11/02/2016  . Lactic acidosis 11/02/2016  . Left knee pain   . Urinary retention   . Pleural effusion   . Chest pain on respiration   . Shortness of breath   . Peripheral edema   . Labile blood pressure   . Sleep disturbance   . Left hemiparesis (HCC) 07/04/2016  . Acute on chronic renal failure (HCC)   . Normochromic normocytic anemia   . DM type 2 with diabetic peripheral neuropathy (HCC)   . Hx of amputation   . Adjustment disorder with mixed anxiety and depressed mood    . Marijuana abuse   . Acute blood loss anemia   . Anemia of chronic disease   . Bowel incontinence   . Tobacco abuse   . Low grade fever   . Hypomagnesemia   . Right knee pain   . Diarrhea   . Spasticity   . Nontraumatic acute hemorrhage of basal ganglia (HCC) 06/28/2016  . Intracerebral hemorrhage (HCC) 06/28/2016  . Cytotoxic brain edema (HCC) 06/28/2016  . Brain herniation (HCC) 06/28/2016  . Fracture, shoulder 09/04/2015  . ESRD (end stage renal disease) on dialysis (HCC)   . Alcoholic cirrhosis of liver with ascites (HCC)   . Alcohol abuse 04/24/2015  . Hypokalemia 04/21/2015  . Anasarca   . Hyponatremia   . Dislocated hip (HCC) 12/24/2013  . 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 History of total hip replacement 12/24/2013  . Proteinuria 08/31/2013  . Microalbuminuria 07/30/2013  .  Charcot foot due to diabetes mellitus (HCC) 07/30/2013  . Noncompliance with medications 07/30/2013  . Essential hypertension 04/06/2012  . Essential hypertension, benign 04/06/2012  percutaneous drain for gallbladder     LOS: 29 days    Wesley Burns [name] 01/05/2018

## 2018-01-05 NOTE — Progress Notes (Signed)
New Admission Note:  Arrival Method:in bed Mental Orientation: alert & oriented x 4 Telemetry:n/a Assessment: Completed Skin: biliary drain on the R quadrant  IV: R hand & R forearm  Safety Measures: Safety Fall Prevention Plan was given, discussed. Admission: Completed 3W16: Patient has been orientated to the room, unit and the staff. Family: at bedside  Orders have been reviewed and implemented. Will continue to monitor the patient. Call light has been placed within reach and bed alarm has been activated.   Lawernce Ion ,RN

## 2018-01-05 NOTE — Progress Notes (Signed)
Patient transported to Interventional Radiology report given at bedside

## 2018-01-05 NOTE — Progress Notes (Signed)
PT Cancellation Note  Patient Details Name: Wesley Burns MRN: [id number] DOB: March 23, 1966   Cancelled Treatment:    Reason Eval/Treat Not Completed: Patient at procedure or test/unavailable   Off the floor for  for perc chole drain placement  Will follow up later today as time allows;  Otherwise, will follow up for PT tomorrow;   Thank you,  Van Clines, PT  Acute Rehabilitation Services Pager [phone] Office [phone]     Levi Aland 01/05/2018, 12:28 PM

## 2018-01-05 NOTE — Progress Notes (Signed)
OT Cancellation Note  Patient Details Name: Wesley Burns MRN: [id number] DOB: Feb 14, 1966   Cancelled Treatment:    Reason Eval/Treat Not Completed: Pain limiting ability to participate. Pt just returned from procedure, c/o 7/10 pain. Will follow up as time allows.  Gaye Alken M.S., OTR/L Pager: [phone]  01/05/2018, 1:56 PM

## 2018-01-05 NOTE — Progress Notes (Signed)
CSW spoke with [place] at [facility]. Bjorn Loser asked that CSW update her with status of pt once determined. CSW informed Bjorn Loser that CSW would call Bjorn Loser back as soon as further information had been given on pt from MD and nursing staff. CSW remains available for support at this time.     Claude Manges Adelyn Roscher, MSW, LCSW-A Emergency Department Clinical Social Worker [phone]

## 2018-01-05 NOTE — Progress Notes (Signed)
[facility] Progress Note  Subjective: no specific c/o. BLood cx's 2/2 + for Ecoli, pan sensititve  Vitals:   01/05/18 1000 01/05/18 1015 01/05/18 1030 01/05/18 1045  BP: (!) 154/83 (!) 160/82 (!) 153/76 (!) 144/79  Pulse: 73 76 71 69  Resp: 20 15 12 13   Temp:      TempSrc:      SpO2: 94%     Weight:      Height:        Inpatient medications: .  stroke: mapping our early stages of recovery book   Does not apply Once  . aspirin EC  81 mg Oral Daily  . calcitRIOL  0.75 mcg Oral Once per day on Tue Thu Sat  . calcium carbonate  1 tablet Oral Daily  . feeding supplement (NEPRO CARB STEADY)  237 mL Oral BID BM  . insulin aspart  0-9 Units Subcutaneous Q4H  . lanthanum  1,000 mg Oral TID WC  . multivitamin  1 tablet Oral QHS  . pantoprazole (PROTONIX) IV  40 mg Intravenous Q24H  . polyethylene glycol  17 g Oral BID  . potassium chloride  40 mEq Oral BID  . senna-docusate  1 tablet Oral BID   . meropenem (MERREM) IV Stopped (01/04/18 1536)   acetaminophen **OR** acetaminophen (TYLENOL) oral liquid 160 mg/5 mL **OR** acetaminophen, camphor-menthol, labetalol, ondansetron (ZOFRAN) IV **OR** [DISCONTINUED] promethazine, RESOURCE THICKENUP CLEAR, tiZANidine  Exam: Alert, no distress, L hemi Chest cta bilat RRR no mrg Abd soft tender RUQ Ext no edema L AVF    Dialysis: TTS [facility] 4h    65kg (lower edw at dc)   2/2 bath  AVF LUE   Hep 2000 (no heparin x 4 wks = Feb 6, after CVA/ bleed) - Mircera 04-09-1977 IV q 2 weeks (last 1/3) - Calcitriol 0.68mcg PO q HD        Impression: 1  R CVA/ bleed 2  GN sepsis/ acute cholecystitis, for chole tube today 3  ESRD HD tts 4  Anemia ckd, Hb stable 5  MBD ckd, cont calcitriol, binder 6  HTN wts and BP up again 7  DM2   Plan - HD Tues, UF 4L, no hep   70m MD [facility] pager [phone]   01/05/2018, 11:18 AM   Recent Labs  Lab 12/30/17 1225 01/01/18 0814  01/03/18 0547 01/04/18 0357  01/05/18 0330  NA 129* 129*   < > 130* 136 133*  K 4.4 4.2   < > 4.5 4.9 4.8  CL 90* 90*   < > 91* 100* 97*  CO2 20* 24   < > 24 23 22   GLUCOSE 151* 114*   < > 93 98 79  BUN 56* 43*   < > 46* 26* 39*  CREATININE 8.02* 6.26*   < > 6.22* 4.42* 5.85*  CALCIUM 8.5* 8.9   < > 8.7* 8.9 8.7*  PHOS 6.0* 5.0*  --   --   --  2.9   < > = values in this interval not displayed.   Recent Labs  Lab 01/03/18 1043 01/04/18 0357 01/05/18 0330  AST 79* 52* 68*  ALT 60 48 53  ALKPHOS 547* 497* 663*  BILITOT 3.1* 2.3* 1.7*  PROT 6.7 6.1* 6.0*  ALBUMIN 2.9* 2.7* 2.8*   Recent Labs  Lab 01/03/18 0547 01/04/18 0357 01/05/18 0330  WBC 7.9 10.0 7.3  NEUTROABS  --  7.7 4.6  HGB 11.3* 11.4* 11.1*  HCT 35.0*  36.1* 35.1*  MCV 90.0 92.6 91.2  PLT 268 282 275   Iron/TIBC/Ferritin/ %Sat    Component Value Date/Time   IRON 37 (L) 12/15/2017 1216   TIBC 239 (L) 12/15/2017 1216   FERRITIN 629 (H) 12/15/2017 1216   IRONPCTSAT 15 (L) 12/15/2017 1216

## 2018-01-06 ENCOUNTER — Inpatient Hospital Stay (HOSPITAL_COMMUNITY): Payer: Self-pay

## 2018-01-06 LAB — PHOSPHORUS: Phosphorus: 4.3 mg/dL (ref 2.5–4.6)

## 2018-01-06 LAB — GLUCOSE, CAPILLARY
GLUCOSE-CAPILLARY: 117 mg/dL — AB (ref 65–99)
Glucose-Capillary: 133 mg/dL — ABNORMAL HIGH (ref 65–99)
Glucose-Capillary: 200 mg/dL — ABNORMAL HIGH (ref 65–99)
Glucose-Capillary: 76 mg/dL (ref 65–99)
Glucose-Capillary: 99 mg/dL (ref 65–99)

## 2018-01-06 LAB — BASIC METABOLIC PANEL
Anion gap: 18 — ABNORMAL HIGH (ref 5–15)
BUN: 48 mg/dL — AB (ref 6–20)
CHLORIDE: 93 mmol/L — AB (ref 101–111)
CO2: 19 mmol/L — ABNORMAL LOW (ref 22–32)
CREATININE: 7.19 mg/dL — AB (ref 0.61–1.24)
Calcium: 8.7 mg/dL — ABNORMAL LOW (ref 8.9–10.3)
GFR calc Af Amer: 9 mL/min — ABNORMAL LOW (ref >60)
GFR, EST NON AFRICAN AMERICAN: 8 mL/min — AB (ref >60)
GLUCOSE: 100 mg/dL — AB (ref 65–99)
Potassium: 5.4 mmol/L — ABNORMAL HIGH (ref 3.5–5.1)
SODIUM: 130 mmol/L — AB (ref 135–145)

## 2018-01-06 LAB — CBC
HCT: 34.3 % — ABNORMAL LOW (ref 39.0–52.0)
Hemoglobin: 11 g/dL — ABNORMAL LOW (ref 13.0–17.0)
MCH: 28.5 pg (ref 26.0–34.0)
MCHC: 32.1 g/dL (ref 30.0–36.0)
MCV: 88.9 fL (ref 78.0–100.0)
PLATELETS: 281 10*3/uL (ref 150–400)
RBC: 3.86 MIL/uL — ABNORMAL LOW (ref 4.22–5.81)
RDW: 18.2 % — AB (ref 11.5–15.5)
WBC: 8.1 10*3/uL (ref 4.0–10.5)

## 2018-01-06 LAB — MAGNESIUM: MAGNESIUM: 2.1 mg/dL (ref 1.7–2.4)

## 2018-01-06 MED ORDER — BENZONATATE 100 MG PO CAPS
100.0000 mg | ORAL_CAPSULE | Freq: Three times a day (TID) | ORAL | Status: DC | PRN
  Administered 2018-01-06 – 2018-01-07 (×2): 100 mg via ORAL
  Filled 2018-01-06 (×2): qty 1

## 2018-01-06 MED ORDER — BOOST / RESOURCE BREEZE PO LIQD CUSTOM
1.0000 | Freq: Three times a day (TID) | ORAL | Status: DC
  Administered 2018-01-06 – 2018-01-08 (×4): 1 via ORAL

## 2018-01-06 NOTE — Progress Notes (Signed)
PT Cancellation Note  Patient Details Name: Wesley Burns MRN: [id number] DOB: August 31, 1966   Cancelled Treatment:    Reason Eval/Treat Not Completed: Patient at procedure or test/unavailable pt off floor at HD. Will follow up as time allows.   Blake Divine A Holland Kotter 01/06/2018, 10:30 AM Mylo Red, PT, DPT [phone]

## 2018-01-06 NOTE — Progress Notes (Signed)
[facility]/Trauma Progress Note      Assessment/Plan Principal Problem:   E coli and enterobacter bacteremia Active Problems:   IVH (intraventricular hemorrhage) (HCC)   Dysphagia, post-stroke   Type 2 diabetes mellitus with peripheral neuropathy (HCC)   History of tobacco abuse   Benign essential HTN   ESRD on dialysis (HCC)   History of traumatic brain injury   History of CVA with residual deficit   History of transmetatarsal amputation of foot (HCC)   ICH (intracerebral hemorrhage) (HCC)   Cholelithiasis with cholecystitis   Septic shock (HCC)   Abdominal pain   Acute biliary pancreatitis  Acute cholecystitis +/- gallstone pancreatitis - pt is not an ideal surgical candidate due to recent stroke - S/P perc chole drain, 02/04, per IR  - continue abx - we will follow   Follow up: Dr. Magnus Ivan 6 weeks    LOS: 30 days    Subjective: CC: mild abdominal pain epigastric region  Pt states nausea and clear emesis yesterday. None today. Pain improving.   Objective: Vital signs in last 24 hours: Temp:  [98 F (36.7 C)-99.3 F (37.4 C)] 98 F (36.7 C) (02/05 0514) Pulse Rate:  [68-96] 96 (02/05 0514) Resp:  [12-23] 18 (02/05 0514) BP: (143-169)/(68-91) 153/73 (02/05 0514) SpO2:  [92 %-96 %] 95 % (02/05 0514) Last BM Date: 01/05/18  Intake/Output from previous day: 02/04 0701 - 02/05 0700 In: 450 [P.O.:200; IV Piggyback:250] Out: 100 [Drains:100] Intake/Output this shift: Total I/O In: -  Out: 100 [Drains:100]  PE: Gen:  Alert, NAD, pleasant, cooperative Pulm:  Rate and effort normal Abd: Soft, not distended, +BS, mild TTP in epigastric region without guarding. Perc chole drain with minimal bloody drainage. Skin: warm and dry   Anti-infectives: Anti-infectives (From admission, onward)   Start     Dose/Rate Route Frequency Ordered Stop   01/05/18 1445  cefTRIAXone (ROCEPHIN) 2 g in dextrose 5 % 50 mL IVPB     2 g 100 mL/hr over 30 Minutes  Intravenous Every 24 hours 01/05/18 1437     01/05/18 1230  ciprofloxacin (CIPRO) IVPB 400 mg     400 mg 200 mL/hr over 60 Minutes Intravenous  Once 01/05/18 1152 01/05/18 1402   01/03/18 1600  meropenem (MERREM) 500 mg in sodium chloride 0.9 % 50 mL IVPB  Status:  Discontinued    Comments:  Pharmacy to adjust the dose   500 mg 100 mL/hr over 30 Minutes Intravenous Every 24 hours 01/03/18 1523 01/05/18 1437   01/03/18 1545  vancomycin (VANCOCIN) 1,500 mg in sodium chloride 0.9 % 500 mL IVPB     1,500 mg 250 mL/hr over 120 Minutes Intravenous  Once 01/03/18 1540 01/03/18 1943   01/03/18 1400  Ampicillin-Sulbactam (UNASYN) 3 g in sodium chloride 0.9 % 100 mL IVPB  Status:  Discontinued     3 g 200 mL/hr over 30 Minutes Intravenous Every 12 hours 01/03/18 1050 01/03/18 1523   01/03/18 1200  metroNIDAZOLE (FLAGYL) IVPB 500 mg  Status:  Discontinued     500 mg 100 mL/hr over 60 Minutes Intravenous Every 8 hours 01/03/18 1049 01/03/18 1526   01/03/18 1100  Ampicillin-Sulbactam (UNASYN) 3 g in sodium chloride 0.9 % 100 mL IVPB  Status:  Discontinued     3 g 200 mL/hr over 30 Minutes Intravenous Every 6 hours 01/03/18 1049 01/03/18 1050   01/03/18 0200  cefTRIAXone (ROCEPHIN) 2 g in dextrose 5 % 50 mL IVPB  Status:  Discontinued  2 g 100 mL/hr over 30 Minutes Intravenous Daily at bedtime 01/03/18 0126 01/03/18 1048      Lab Results:  Recent Labs    01/05/18 0330 01/05/18 1555  WBC 7.3 8.0  HGB 11.1* 12.9*  HCT 35.1* 40.3  PLT 275 291   BMET Recent Labs    01/05/18 0330 01/06/18 0549  NA 133* 130*  K 4.8 5.4*  CL 97* 93*  CO2 22 19*  GLUCOSE 79 100*  BUN 39* 48*  CREATININE 5.85* 7.19*  CALCIUM 8.7* 8.7*   PT/INR Recent Labs    01/04/18 0357  LABPROT 14.4  INR 1.13   CMP     Component Value Date/Time   NA 130 (L) 01/06/2018 0549   K 5.4 (H) 01/06/2018 0549   CL 93 (L) 01/06/2018 0549   CO2 19 (L) 01/06/2018 0549   GLUCOSE 100 (H) 01/06/2018 0549   BUN 48 (H)  01/06/2018 0549   CREATININE 7.19 (H) 01/06/2018 0549   CREATININE 0.85 07/29/2013 1610   CALCIUM 8.7 (L) 01/06/2018 0549   CALCIUM 8.4 (L) 12/16/2017 0740   PROT 6.0 (L) 01/05/2018 0330   ALBUMIN 2.8 (L) 01/05/2018 0330   AST 68 (H) 01/05/2018 0330   ALT 53 01/05/2018 0330   ALKPHOS 663 (H) 01/05/2018 0330   BILITOT 1.7 (H) 01/05/2018 0330   GFRNONAA 8 (L) 01/06/2018 0549   GFRAA 9 (L) 01/06/2018 0549   Lipase     Component Value Date/Time   LIPASE 658 (H) 01/05/2018 0330    Studies/Results: Nm Hepatobiliary Liver Func  Addendum Date: 01/04/2018   ADDENDUM REPORT: 01/04/2018 11:57 ADDENDUM: These results were called by telephone at the time of interpretation on 01/04/2018 at 11:45 am to NP Blase Mess, who verbally acknowledged these results. Electronically Signed   By: Ted Mcalpine M.D.   On: 01/04/2018 11:57   Result Date: 01/04/2018 CLINICAL DATA:  Abdominal pain. Abnormal appearance of the gallbladder by CT. EXAM: NUCLEAR MEDICINE HEPATOBILIARY IMAGING WITH GALLBLADDER EF TECHNIQUE: Sequential images of the abdomen were obtained out to 60 minutes following intravenous administration of radiopharmaceutical. RADIOPHARMACEUTICALS:  8.03 mCi Tc-57m Choletec IV 2.1 mg of morphine were administered intravenously due to nonvisualization of the gallbladder at 60 minutes. COMPARISON:  None. FINDINGS: Prompt uptake and biliary excretion of activity by the liver is seen. Gallbladder activity is not visualized consistent with occlusion of the cystic duct. Biliary activity passes into small bowel, consistent with patent common bile duct, although there was a delayed radiotracer passage to small bowel, first seen at 65 minutes. Calculated gallbladder ejection fraction was not calculated. IMPRESSION: Nonvisualization of the gallbladder consistent with occlusion at the level of the cystic duct, consistent with acute cholecystitis. Slow progression of radiotracer to the small bowel, consistent with  at least partially patent extrahepatic common bile duct. Electronically Signed: By: Ted Mcalpine M.D. On: 01/04/2018 11:44   Ir Perc Cholecystostomy  Result Date: 01/05/2018 INDICATION: 52 year old male with acute calculus cholecystitis. He is currently a poor operative candidate due to a relatively recent history of stroke (12/07/2017). He presents for placement of a percutaneous cholecystostomy tube. EXAM: CHOLECYSTOSTOMY MEDICATIONS: 400 mg ciprofloxacin; The antibiotic was administered within an appropriate time frame prior to the initiation of the procedure. ANESTHESIA/SEDATION: Moderate (conscious) sedation was employed during this procedure. A total of Versed 2 mg and Fentanyl 100 mcg was administered intravenously. Moderate Sedation Time: 16 minutes. The patient's level of consciousness and vital signs were monitored continuously by radiology nursing throughout the procedure  under my direct supervision. FLUOROSCOPY TIME:  Fluoroscopy Time: 0 minutes 42 seconds (1 mGy). COMPLICATIONS: None immediate. PROCEDURE: Informed written consent was obtained from the patient after a thorough discussion of the procedural risks, benefits and alternatives. All questions were addressed. Maximal Sterile Barrier Technique was utilized including caps, mask, sterile gowns, sterile gloves, sterile drape, hand hygiene and skin antiseptic. A timeout was performed prior to the initiation of the procedure. The right upper quadrant was interrogated with ultrasound. The gallbladder is thickened and contains internal sludge and small stones. The liver is echogenic. A suitable skin entry site was selected and marked. Local anesthesia was attained by infiltration with 1% lidocaine. A small dermatotomy was made. Under real-time sonographic guidance, the gallbladder lumen was punctured using a 21 gauge Accustick needle. The needle was passed through a short segment of hepatic parenchyma before entering the gallbladder lumen. A  0.018 wire was then coiled within the gallbladder lumen. The Accustick needle was removed and the Accustick sheath advanced over the wire and into the gallbladder lumen. A gentle hand injection of contrast material was performed opacifying the gallbladder lumen. An Amplatz wire was then advanced through the Accustick sheath which was exchanged and the skin tract dilated to 10 [place]. A Cook 10.2 [place] all-purpose drainage catheter was then advanced over the wire and formed in the gallbladder lumen. A final image was stored for the medical record. The catheter was secured to the skin with 0 Prolene suture and connected to gravity bag drainage. IMPRESSION: Successful placement of a transhepatic percutaneous cholecystostomy tube for acute calculus cholecystitis. PLAN: 1. Maintain tube to gravity bag drainage. 2. Patient will require tube evaluation and exchange every 8 weeks until he becomes a suitable operative candidate and can undergo definitive cholecystectomy. Signed, Sterling Big, MD [facility] Radiology Electronically Signed   By: Malachy Moan M.D.   On: 01/05/2018 17:27      Jerre Simon , [facility] 01/06/2018, 9:32 AM Pager: [phone] Consults: [phone] Mon-Fri 7:00 am-4:30 pm Sat-Sun 7:00 am-11:30 am

## 2018-01-06 NOTE — Progress Notes (Signed)
PROGRESS NOTE    Wesley Burns  ZOX:[id number] DOB: 25-Jun-1966 DOA: 12/07/2017 PCP: Darrow Bussing, MD   Brief Narrative:  Very complex past medical history 52 year old Caucasian male who is here with intracranial hemorrhage almost a month ago with left-sided weakness was seen by neurology he also had end-stage renal disease diabetes and hypertension during his stay he developed pancreatitis bacteremia transaminitis   he has bacteremia with E. coli and Enterobacter he was given ampicillin sulbactam and Flagyl which was DC'd and changed to meropenem and vancomycin patient has been n.p.o. for several days given his generalized abdominal pain not eating well.  Pt is s/p perc chole by IR, general surgery following.  Assessment & Plan:  Shock - combination of septic shock from E.coli prior PN reports enterococcus but not listed on last blood culture result  (presumed from acute cholecystitis) and hypovolemic shock after dialysis. - pt was on meropenem and is currently on ceftriaxone - VSS and shock physiology resolved  HTN - on last check wnl. Will continue to monitor. - Continue Labetalol PRN,  goal SBP < 140 per neuro.   Acute cholecystitis -  Surgery recommended IR to place perc chole. Pt is s/p perc chole by IR  ESRD (T/TH/Sat). - Nephrology on board and managing.  Recent ICH (12/07/17). Neuro following.   DM II. - Continue SSI.   DVT prophylaxis: SCD's Code Status: Full Family Communication: d/c patient directly Disposition Plan: pending improvement in condition   Consultants:   General surgery  Neurology  IR   Procedures: s/p perc chole drain by IR   Antimicrobials: Rocephin   Subjective: Pt complaining of some coughing causing some discomfort, nurse called and informed me that she felt like left shoulder felt unusual. PT supposedly reporting shoulder subluxated. As such ordered left shoulder x-ray  Objective: Vitals:   01/06/18 1230 01/06/18 1310  01/06/18 1400 01/06/18 1500  BP: (!) 177/105 (!) 187/100 130/69 132/74  Pulse: 89 83  74  Resp: 16 16  18   Temp:  (!) 97.5 F (36.4 C)  98.2 F (36.8 C)  TempSrc:  Oral  Oral  SpO2:    99%  Weight:  64.6 kg (142 lb 6.7 oz)    Height:        Intake/Output Summary (Last 24 hours) at 01/06/2018 1736 Last data filed at 01/06/2018 1310 Gross per 24 hour  Intake -  Output 4600 ml  Net -4600 ml   Filed Weights   01/05/18 0600 01/06/18 0835 01/06/18 1310  Weight: 67.1 kg (147 lb 14.9 oz) 68.3 kg (150 lb 9.2 oz) 64.6 kg (142 lb 6.7 oz)    Examination:  General exam: Appears calm and comfortable, in nad. Respiratory system:  Respiratory effort normal. No wheezes Cardiovascular system: S1 & S2 heard, RRR. No JVD, murmurs Gastrointestinal system: Abdomen is nondistended, soft and nontender.  Central nervous system: Alert and oriented. Answers questions appropriately Extremities: Warm, no cyanosis Skin: No rashes, lesions or ulcers, on limited exam Psychiatry: . Mood & affect appropriate.   Data Reviewed: I have personally reviewed following labs and imaging studies  CBC: Recent Labs  Lab 01/03/18 0547 01/04/18 0357 01/05/18 0330 01/05/18 1555 01/06/18 0859  WBC 7.9 10.0 7.3 8.0 8.1  NEUTROABS  --  7.7 4.6  --   --   HGB 11.3* 11.4* 11.1* 12.9* 11.0*  HCT 35.0* 36.1* 35.1* 40.3 34.3*  MCV 90.0 92.6 91.2 91.2 88.9  PLT 268 282 275 291 281   Basic Metabolic Panel:  Recent Labs  Lab 01/01/18 0814 01/02/18 0402 01/03/18 0547 01/04/18 0357 01/05/18 0330 01/06/18 0549  NA 129* 136 130* 136 133* 130*  K 4.2 3.4* 4.5 4.9 4.8 5.4*  CL 90* 91* 91* 100* 97* 93*  CO2 24 25 24 23 22  19*  GLUCOSE 114* 120* 93 98 79 100*  BUN 43* 25* 46* 26* 39* 48*  CREATININE 6.26* 4.85* 6.22* 4.42* 5.85* 7.19*  CALCIUM 8.9 9.2 8.7* 8.9 8.7* 8.7*  MG  --   --   --  1.9  --  2.1  PHOS 5.0*  --   --   --  2.9 4.3   GFR: Estimated Creatinine Clearance: 10.9 mL/min (A) (by C-G formula based on  SCr of 7.19 mg/dL (H)). Liver Function Tests: Recent Labs  Lab 01/01/18 0814 01/03/18 1043 01/04/18 0357 01/05/18 0330  AST  --  79* 52* 68*  ALT  --  60 48 53  ALKPHOS  --  547* 497* 663*  BILITOT  --  3.1* 2.3* 1.7*  PROT  --  6.7 6.1* 6.0*  ALBUMIN 3.0* 2.9* 2.7* 2.8*   Recent Labs  Lab 01/03/18 1043 01/04/18 0357 01/05/18 0330  LIPASE 1,663* 956* 658*   No results for input(s): AMMONIA in the last 168 hours. Coagulation Profile: Recent Labs  Lab 01/04/18 0357  INR 1.13   Cardiac Enzymes: No results for input(s): CKTOTAL, CKMB, CKMBINDEX, TROPONINI in the last 168 hours. BNP (last 3 results) No results for input(s): PROBNP in the last 8760 hours. HbA1C: No results for input(s): HGBA1C in the last 72 hours. CBG: Recent Labs  Lab 01/05/18 1947 01/06/18 0003 01/06/18 0415 01/06/18 1358 01/06/18 1700  GLUCAP 125* 120* 99 117* 133*   Lipid Profile: No results for input(s): CHOL, HDL, LDLCALC, TRIG, CHOLHDL, LDLDIRECT in the last 72 hours. Thyroid Function Tests: No results for input(s): TSH, T4TOTAL, FREET4, T3FREE, THYROIDAB in the last 72 hours. Anemia Panel: No results for input(s): VITAMINB12, FOLATE, FERRITIN, TIBC, IRON, RETICCTPCT in the last 72 hours. Sepsis Labs: Recent Labs  Lab 01/03/18 1509  PROCALCITON 26.67  LATICACIDVEN 1.8    Recent Results (from the past 240 hour(s))  Culture, blood (Routine X 2) w Reflex to ID Panel     Status: Abnormal   Collection Time: 01/02/18  8:35 AM  Result Value Ref Range Status   Specimen Description BLOOD SITE NOT SPECIFIED  Final   Special Requests   Final    BOTTLES DRAWN AEROBIC AND ANAEROBIC Blood Culture adequate volume   Culture  Setup Time   Final    GRAM NEGATIVE RODS IN BOTH AEROBIC AND ANAEROBIC BOTTLES CRITICAL RESULT CALLED TO, READ BACK BY AND VERIFIED WITH: G ABOTT PHARMD 01/03/18 1252 JDW    Culture (A)  Final    ESCHERICHIA COLI SUSCEPTIBILITIES PERFORMED ON PREVIOUS CULTURE WITHIN THE  LAST 5 DAYS. Performed at [facility], [address] [place], [place]    Report Status 01/05/2018 FINAL  Final  Culture, blood (Routine X 2) w Reflex to ID Panel     Status: Abnormal   Collection Time: 01/02/18  8:36 AM  Result Value Ref Range Status   Specimen Description BLOOD SITE NOT SPECIFIED  Final   Special Requests   Final    BOTTLES DRAWN AEROBIC AND ANAEROBIC Blood Culture adequate volume   Culture  Setup Time   Final    GRAM NEGATIVE RODS IN BOTH AEROBIC AND ANAEROBIC BOTTLES CRITICAL RESULT CALLED TO, READ  BACK BY AND VERIFIED WITH: Mervyn Gay [facility] 01/03/18 1252 JDW Performed at [facility], [address] [place], [place] [address]    Culture ESCHERICHIA COLI (A)  Final   Report Status 01/05/2018 FINAL  Final   Organism ID, Bacteria ESCHERICHIA COLI  Final      Susceptibility   Escherichia coli - MIC*    AMPICILLIN <=2 SENSITIVE Sensitive     CEFAZOLIN <=4 SENSITIVE Sensitive     CEFEPIME <=1 SENSITIVE Sensitive     CEFTAZIDIME <=1 SENSITIVE Sensitive     CEFTRIAXONE <=1 SENSITIVE Sensitive     CIPROFLOXACIN <=0.25 SENSITIVE Sensitive     GENTAMICIN <=1 SENSITIVE Sensitive     IMIPENEM <=0.25 SENSITIVE Sensitive     TRIMETH/SULFA <=20 SENSITIVE Sensitive     AMPICILLIN/SULBACTAM <=2 SENSITIVE Sensitive     PIP/TAZO <=4 SENSITIVE Sensitive     Extended ESBL NEGATIVE Sensitive     * ESCHERICHIA COLI  Blood Culture ID Panel (Reflexed)     Status: Abnormal   Collection Time: 01/02/18  8:36 AM  Result Value Ref Range Status   Enterococcus species NOT DETECTED NOT DETECTED Final   Listeria monocytogenes NOT DETECTED NOT DETECTED Final   Staphylococcus species NOT DETECTED NOT DETECTED Final   Staphylococcus aureus NOT DETECTED NOT DETECTED Final   Streptococcus species NOT DETECTED NOT DETECTED Final   Streptococcus agalactiae NOT DETECTED NOT DETECTED Final   Streptococcus pneumoniae NOT DETECTED NOT DETECTED Final   Streptococcus pyogenes  NOT DETECTED NOT DETECTED Final   Acinetobacter baumannii NOT DETECTED NOT DETECTED Final   Enterobacteriaceae species DETECTED (A) NOT DETECTED Final    Comment: Enterobacteriaceae represent a large family of gram-negative bacteria, not a single organism. CRITICAL RESULT CALLED TO, READ BACK BY AND VERIFIED WITH: G ABOTT PHARMD 01/03/18 1252 JDW    Enterobacter cloacae complex NOT DETECTED NOT DETECTED Final   Escherichia coli DETECTED (A) NOT DETECTED Final    Comment: CRITICAL RESULT CALLED TO, READ BACK BY AND VERIFIED WITH: G ABOTT PHARMD 01/03/18 1252 JDW    Klebsiella oxytoca NOT DETECTED NOT DETECTED Final   Klebsiella pneumoniae NOT DETECTED NOT DETECTED Final   Proteus species NOT DETECTED NOT DETECTED Final   Serratia marcescens NOT DETECTED NOT DETECTED Final   Carbapenem resistance NOT DETECTED NOT DETECTED Final   Haemophilus influenzae NOT DETECTED NOT DETECTED Final   Neisseria meningitidis NOT DETECTED NOT DETECTED Final   Pseudomonas aeruginosa NOT DETECTED NOT DETECTED Final   Candida albicans NOT DETECTED NOT DETECTED Final   Candida glabrata NOT DETECTED NOT DETECTED Final   Candida krusei NOT DETECTED NOT DETECTED Final   Candida parapsilosis NOT DETECTED NOT DETECTED Final   Candida tropicalis NOT DETECTED NOT DETECTED Final  MRSA PCR Screening     Status: None   Collection Time: 01/03/18  4:59 PM  Result Value Ref Range Status   MRSA by PCR NEGATIVE NEGATIVE Final    Comment:        The GeneXpert MRSA Assay (FDA approved for NASAL specimens only), is one component of a comprehensive MRSA colonization surveillance program. It is not intended to diagnose MRSA infection nor to guide or monitor treatment for MRSA infections. Performed at [facility], [address], [place], [place] [address]      Radiology Studies: Ir Perc Cholecystostomy  Result Date: 01/05/2018 INDICATION: 52 year old male with acute calculus cholecystitis. He is currently a poor  operative candidate due to a relatively recent history of  stroke (12/07/2017). He presents for placement of a percutaneous cholecystostomy tube. EXAM: CHOLECYSTOSTOMY MEDICATIONS: 400 mg ciprofloxacin; The antibiotic was administered within an appropriate time frame prior to the initiation of the procedure. ANESTHESIA/SEDATION: Moderate (conscious) sedation was employed during this procedure. A total of Versed 2 mg and Fentanyl 100 mcg was administered intravenously. Moderate Sedation Time: 16 minutes. The patient's level of consciousness and vital signs were monitored continuously by radiology nursing throughout the procedure under my direct supervision. FLUOROSCOPY TIME:  Fluoroscopy Time: 0 minutes 42 seconds (1 mGy). COMPLICATIONS: None immediate. PROCEDURE: Informed written consent was obtained from the patient after a thorough discussion of the procedural risks, benefits and alternatives. All questions were addressed. Maximal Sterile Barrier Technique was utilized including caps, mask, sterile gowns, sterile gloves, sterile drape, hand hygiene and skin antiseptic. A timeout was performed prior to the initiation of the procedure. The right upper quadrant was interrogated with ultrasound. The gallbladder is thickened and contains internal sludge and small stones. The liver is echogenic. A suitable skin entry site was selected and marked. Local anesthesia was attained by infiltration with 1% lidocaine. A small dermatotomy was made. Under real-time sonographic guidance, the gallbladder lumen was punctured using a 21 gauge Accustick needle. The needle was passed through a short segment of hepatic parenchyma before entering the gallbladder lumen. A 0.018 wire was then coiled within the gallbladder lumen. The Accustick needle was removed and the Accustick sheath advanced over the wire and into the gallbladder lumen. A gentle hand injection of contrast material was performed opacifying the gallbladder lumen. An  Amplatz wire was then advanced through the Accustick sheath which was exchanged and the skin tract dilated to 10 [place]. A Cook 10.2 [place] all-purpose drainage catheter was then advanced over the wire and formed in the gallbladder lumen. A final image was stored for the medical record. The catheter was secured to the skin with 0 Prolene suture and connected to gravity bag drainage. IMPRESSION: Successful placement of a transhepatic percutaneous cholecystostomy tube for acute calculus cholecystitis. PLAN: 1. Maintain tube to gravity bag drainage. 2. Patient will require tube evaluation and exchange every 8 weeks until he becomes a suitable operative candidate and can undergo definitive cholecystectomy. Signed, Sterling Big, MD [facility] Radiology Electronically Signed   By: Malachy Moan M.D.   On: 01/05/2018 17:27   Scheduled Meds: .  stroke: mapping our early stages of recovery book   Does not apply Once  . aspirin EC  81 mg Oral Daily  . calcitRIOL  0.75 mcg Oral Once per day on Tue Thu Sat  . calcium carbonate  1 tablet Oral Daily  . feeding supplement  1 Container Oral TID BM  . insulin aspart  0-9 Units Subcutaneous Q4H  . lanthanum  1,000 mg Oral TID WC  . multivitamin  1 tablet Oral QHS  . pantoprazole  40 mg Oral Daily  . polyethylene glycol  17 g Oral BID  . senna-docusate  1 tablet Oral BID   Continuous Infusions: . cefTRIAXone (ROCEPHIN)  IV 2 g (01/06/18 1515)     LOS: 30 days   Time spent: > 35 minutes  Penny Pia, MD [facility] Pager [phone]  If 7PM-7AM, please contact night-coverage [email or web address] Password [facility] 01/06/2018, 5:36 PM

## 2018-01-06 NOTE — Progress Notes (Signed)
Patient transported off unit for HD.

## 2018-01-06 NOTE — Progress Notes (Signed)
CSW following for discharge plan. CSW contacted by Amy at [facility] for update on patient, because he did not attend his scheduled dialysis session for this morning at [facility]. CSW provided clinical update to Amy and indicated that she will update the dialysis facility once patient is medically cleared to discharge to SNF. CSW also contacted Admissions at [facility] in [place] to provide clinical update on patient, and that he is not yet medically stable to transition to SNF at this time.  Patient still has bed available and outpatient dialysis set up when medically ready. Per [facility], new dialysis sessions cannot be set up on Saturdays, so he will not be able to have his first outpatient dialysis session begin on a Saturday, whenever he is medically ready to discharge. CSW will continue to follow.  Blenda Nicely, [place] Clinical Social Worker [phone]

## 2018-01-06 NOTE — Progress Notes (Signed)
Nutrition Follow-up  DOCUMENTATION CODES:   Not applicable  INTERVENTION:   Boost Breeze TID each supplement provides 250 kcal and 9 grams protein   NUTRITION DIAGNOSIS:   Increased nutrient needs related to wound healing(ESRD on HD) as evidenced by estimated needs.  Ongoing  GOAL:   Patient will meet greater than or equal to 90% of their needs  Progressing  MONITOR:   Diet advancement, Supplement acceptance, Labs   ASSESSMENT:   Pt with PMH of ESRD on HD TTS, DM, HTN, diabetic foot ulcers admitted 1/6 with cerebellar ICH.  1/31 Pt has been NPO/CLD x5days; per chart review PO intake 100% prior to this 2/2 Pt with AMS and shock tx to ICU for sepsis 2/3 HIDA scan shows Acute cholecystitis, pt not a surgical candidate 2/4 s/p percutaneuous cholecystostomy in IR  Pt -14 L Abdominal drain 100 mL x 24hrs  Pt reports "really great appetite" PTA and when initially admitted to hospital.  He is currently hungry, would like to resume PO as soon as allowed; he also expressed concerns about getting enough nutrition due to his current NPO/CLD status. Pt reports he is consuming all food provided on his current CLD. Pt reports he liked Nepro supplement provided prior to NPO/CLD; however, reports nausea after consuming Nepro. Pt reports lactose intolerance.  Mobility/function:  Pt denies any difficulties with self-feeding, chewing, or swallowing.  Pt reports good mobility using cane at home. Foot amputations well healed. Pt denies having mobility issues related to prior foot amputations using a cane at home. Lives with partner. Reports eating three meals/day PTA (purchased/no self-prepared meals)  RD asked pt about prior DM hx; pt denies currently having DM and does not take insulin anymore, "my doctor took me off of insulin since my numbers looked so good". RD discussed adding Boost Breeze to current diet. Pt expressed interest in Boost Breeze.  Pt reports that he feels like he  lost weight recently. Reports UBW (dry wt) of 145 lb as of 1 month ago.   Medications reviewed:  SS insulin Calcitriol (post HD) Calcium carbonate RENA-VIT Protonix Miralax Senecot   Labs reviewed Sodium 130 (L) Potassium 5.4 (H) CBG's 125, 120, 99   Diet Order:  Fall precautions Diet clear liquid Room service appropriate? Yes; Fluid consistency: Thin  EDUCATION NEEDS:   Education needs have been addressed  Skin:  Skin Assessment: Skin Integrity Issues: Skin Integrity Issues:: Stage II(Partial Rt foot amputation) Stage II: to ball of left foot; non-healing as of 2/4  Last BM:  2/4 Type 7, large  Height:   Ht Readings from Last 1 Encounters:  01/03/18 5\' 11"  (1.803 m)    Weight:   Wt Readings from Last 1 Encounters:  01/06/18 142 lb 6.7 oz (64.6 kg)    Ideal Body Weight:  78.18 kg  BMI:  Body mass index is 19.86 kg/m.  Estimated Nutritional Needs:   Kcal:  2,000-2,200 kcal  Protein:  87-100 grams  Fluid:  UOP +1L    03/06/18, MS Dietetic Intern Pager [phone]

## 2018-01-06 NOTE — Progress Notes (Signed)
Referring Physician(s): [facility]- Dr Warren Lacy   Supervising Physician: Irish Lack  Patient Status:  [facility] - In-pt  Chief Complaint:  cholecystitis  Subjective:  Percutaneous cholecystostomy 2/4 in IR In dialysis now  pleasant Denies pain Plans for SNF in [place] per pt  Allergies: Prednisone and Adhesive [tape]  Medications: Prior to Admission medications   Medication Sig Start Date End Date Taking? Authorizing Provider  amLODipine (NORVASC) 10 MG tablet Take 1 tablet (10 mg total) by mouth daily. 07/19/16  Yes Love, Evlyn Kanner, PA-C  FLUoxetine (PROZAC) 10 MG capsule Take 1 capsule (10 mg total) by mouth daily. 07/19/16  Yes Love, Evlyn Kanner, PA-C  lidocaine-prilocaine (EMLA) cream APPLY TO ACCESS PREDIALYSIS 09/25/17  Yes [provider]  pantoprazole (PROTONIX) 40 MG tablet Take 1 tablet (40 mg total) by mouth daily. 07/19/16  Yes Love, Evlyn Kanner, PA-C  sevelamer carbonate (RENVELA) 800 MG tablet Take 1 tablet (800 mg total) by mouth 3 (three) times daily with meals. Patient taking differently: Take 800 mg by mouth 2 (two) times daily.  07/19/16  Yes Love, Evlyn Kanner, PA-C  silver sulfADIAZINE (SILVADENE) 1 % cream Apply 1 application daily topically. Patient taking differently: Apply 1 application topically See admin instructions. 1 application to ulcer on affected foot once a day 10/06/17  Yes Tuchman, Richard C, DPM  bethanechol (URECHOLINE) 10 MG tablet Take 1 tablet (10 mg total) by mouth 3 (three) times daily. Patient not taking: Reported on 12/07/2017 07/19/16   Love, Evlyn Kanner, PA-C  cloNIDine (CATAPRES) 0.1 MG tablet Take 1 tablet (0.1 mg total) by mouth 2 (two) times daily. Patient not taking: Reported on 12/07/2017 07/19/16   Love, Evlyn Kanner, PA-C  febuxostat (ULORIC) 40 MG tablet Take 20 mg by mouth daily.    [provider]  ferrous sulfate 325 (65 FE) MG tablet Take 1 tablet (325 mg total) by mouth 3 (three) times daily with meals. Patient not taking:  Reported on 12/07/2017 07/19/16   Love, Evlyn Kanner, PA-C  furosemide (LASIX) 80 MG tablet Take 2 tablets (160 mg total) by mouth 3 (three) times daily. Patient not taking: Reported on 12/07/2017 07/19/16   Love, Evlyn Kanner, PA-C  magnesium oxide (MAG-OX) 400 (241.3 Mg) MG tablet Take 1 tablet (400 mg total) by mouth daily. Patient not taking: Reported on 12/07/2017 07/19/16   Love, Evlyn Kanner, PA-C  oxyCODONE-acetaminophen (PERCOCET) 5-325 MG tablet Take 1 tablet by mouth every 6 (six) hours as needed for severe pain. Patient not taking: Reported on 12/07/2017 10/02/16   Dara Lords, PA-C  sodium bicarbonate 650 MG tablet Take 1 tablet (650 mg total) by mouth 3 (three) times daily. Patient not taking: Reported on 12/07/2017 07/19/16   Love, Evlyn Kanner, PA-C  tamsulosin (FLOMAX) 0.4 MG CAPS capsule Take 1 capsule (0.4 mg total) by mouth daily after supper. Patient not taking: Reported on 12/07/2017 07/19/16   Jacquelynn Cree, PA-C     Vital Signs: BP (!) 151/77 (BP Location: Right Arm)   Pulse 64   Temp 98.7 F (37.1 C) (Oral)   Resp 16   Ht 5\' 11"  (1.803 m)   Wt 150 lb 9.2 oz (68.3 kg)   SpO2 92%   BMI 21.00 kg/m   Physical Exam  Abdominal: Soft. Bowel sounds are normal.  Musculoskeletal: Normal range of motion.  Neurological: He is alert.  Skin: Skin is warm and dry.  Skin site is clean and dry NT No bleeding No hematoma  OP bloody 100 cc yesterday  100 today--- 20 cc in bag     Nursing note and vitals reviewed.   Imaging: Ct Abdomen Pelvis Wo Contrast  Result Date: 01/02/2018 CLINICAL DATA:  Periumbilical pain with nausea and vomiting. EXAM: CT ABDOMEN AND PELVIS WITHOUT CONTRAST TECHNIQUE: Multidetector CT imaging of the abdomen and pelvis was performed following the standard protocol without IV contrast. COMPARISON:  Abdominal radiograph dated 01/01/2018 FINDINGS: Lower chest: Minimal left base atelectasis. Coronary artery calcifications. Hepatobiliary: Numerous gallstones. Thickening of  the gallbladder wall. The gallbladder is distended. Common bile duct is dilated to a diameter of 8 mm. No dilated intrahepatic bile ducts. Pancreas: Unremarkable. No pancreatic ductal dilatation or surrounding inflammatory changes. Spleen: Normal in size without focal abnormality. Adrenals/Urinary Tract: Adrenal glands are unremarkable. Kidneys are normal, without renal calculi, focal lesion, or hydronephrosis. Bladder is unremarkable. Stomach/Bowel: Stomach is within normal limits. Appendix appears normal. No evidence of bowel wall thickening, distention, or inflammatory changes. Scattered diverticula in the descending colon. Vascular/Lymphatic: Aortic atherosclerosis. No enlarged abdominal or pelvic lymph nodes. Reproductive: Prostate is unremarkable. Other: Tiny amount of free fluid in the pelvis. Slight soft tissue stranding in the perinephric spaces and pericolic gutters. No free air. Musculoskeletal: Bilateral hip prostheses. Chronic fusion of the sacroiliac joints. IMPRESSION: Multiple gallstones with thickening and distention of the gallbladder with slight haziness in the pericholecystic fat which could represent acute or chronic cholecystitis. Small amount of free fluid in the pelvis. Electronically Signed   By: Francene Boyers M.D.   On: 01/02/2018 12:59   Nm Hepatobiliary Liver Func  Addendum Date: 01/04/2018   ADDENDUM REPORT: 01/04/2018 11:57 ADDENDUM: These results were called by telephone at the time of interpretation on 01/04/2018 at 11:45 am to NP [facility], who verbally acknowledged these results. Electronically Signed   By: Ted Mcalpine M.D.   On: 01/04/2018 11:57   Result Date: 01/04/2018 CLINICAL DATA:  Abdominal pain. Abnormal appearance of the gallbladder by CT. EXAM: NUCLEAR MEDICINE HEPATOBILIARY IMAGING WITH GALLBLADDER EF TECHNIQUE: Sequential images of the abdomen were obtained out to 60 minutes following intravenous administration of radiopharmaceutical. RADIOPHARMACEUTICALS:   8.03 mCi Tc-58m Choletec IV 2.1 mg of morphine were administered intravenously due to nonvisualization of the gallbladder at 60 minutes. COMPARISON:  None. FINDINGS: Prompt uptake and biliary excretion of activity by the liver is seen. Gallbladder activity is not visualized consistent with occlusion of the cystic duct. Biliary activity passes into small bowel, consistent with patent common bile duct, although there was a delayed radiotracer passage to small bowel, first seen at 65 minutes. Calculated gallbladder ejection fraction was not calculated. IMPRESSION: Nonvisualization of the gallbladder consistent with occlusion at the level of the cystic duct, consistent with acute cholecystitis. Slow progression of radiotracer to the small bowel, consistent with at least partially patent extrahepatic common bile duct. Electronically Signed: By: Ted Mcalpine M.D. On: 01/04/2018 11:44   Ir Perc Cholecystostomy  Result Date: 01/05/2018 INDICATION: 52 year old male with acute calculus cholecystitis. He is currently a poor operative candidate due to a relatively recent history of stroke (12/07/2017). He presents for placement of a percutaneous cholecystostomy tube. EXAM: CHOLECYSTOSTOMY MEDICATIONS: 400 mg ciprofloxacin; The antibiotic was administered within an appropriate time frame prior to the initiation of the procedure. ANESTHESIA/SEDATION: Moderate (conscious) sedation was employed during this procedure. A total of Versed 2 mg and Fentanyl 100 mcg was administered intravenously. Moderate Sedation Time: 16 minutes. The patient's level of consciousness and vital signs were monitored continuously by radiology  nursing throughout the procedure under my direct supervision. FLUOROSCOPY TIME:  Fluoroscopy Time: 0 minutes 42 seconds (1 mGy). COMPLICATIONS: None immediate. PROCEDURE: Informed written consent was obtained from the patient after a thorough discussion of the procedural risks, benefits and alternatives.  All questions were addressed. Maximal Sterile Barrier Technique was utilized including caps, mask, sterile gowns, sterile gloves, sterile drape, hand hygiene and skin antiseptic. A timeout was performed prior to the initiation of the procedure. The right upper quadrant was interrogated with ultrasound. The gallbladder is thickened and contains internal sludge and small stones. The liver is echogenic. A suitable skin entry site was selected and marked. Local anesthesia was attained by infiltration with 1% lidocaine. A small dermatotomy was made. Under real-time sonographic guidance, the gallbladder lumen was punctured using a 21 gauge Accustick needle. The needle was passed through a short segment of hepatic parenchyma before entering the gallbladder lumen. A 0.018 wire was then coiled within the gallbladder lumen. The Accustick needle was removed and the Accustick sheath advanced over the wire and into the gallbladder lumen. A gentle hand injection of contrast material was performed opacifying the gallbladder lumen. An Amplatz wire was then advanced through the Accustick sheath which was exchanged and the skin tract dilated to 10 [place]. A Cook 10.2 [place] all-purpose drainage catheter was then advanced over the wire and formed in the gallbladder lumen. A final image was stored for the medical record. The catheter was secured to the skin with 0 Prolene suture and connected to gravity bag drainage. IMPRESSION: Successful placement of a transhepatic percutaneous cholecystostomy tube for acute calculus cholecystitis. PLAN: 1. Maintain tube to gravity bag drainage. 2. Patient will require tube evaluation and exchange every 8 weeks until he becomes a suitable operative candidate and can undergo definitive cholecystectomy. Signed, Sterling Big, MD [facility] Radiology Electronically Signed   By: Malachy Moan M.D.   On: 01/05/2018 17:27   Dg Chest Port 1  View  Result Date: 01/04/2018 CLINICAL DATA:  Sepsis. EXAM: PORTABLE CHEST 1 VIEW COMPARISON:  01/02/2018 FINDINGS: The cardiomediastinal silhouette is unchanged. The lungs are mildly hypoinflated. No confluent airspace opacity, edema, sizable pleural effusion, or pneumothorax is identified. There has been prior internal fixation of a proximal left humerus fracture. IMPRESSION: Hypoinflation.  No evidence of acute airspace disease. Electronically Signed   By: Sebastian Ache M.D.   On: 01/04/2018 06:48    Labs:  CBC: Recent Labs    01/04/18 0357 01/05/18 0330 01/05/18 1555 01/06/18 0859  WBC 10.0 7.3 8.0 8.1  HGB 11.4* 11.1* 12.9* 11.0*  HCT 36.1* 35.1* 40.3 34.3*  PLT 282 275 291 281    COAGS: Recent Labs    12/07/17 1619 01/04/18 0357  INR 0.93 1.13  APTT 31 34    BMP: Recent Labs    01/03/18 0547 01/04/18 0357 01/05/18 0330 01/06/18 0549  NA 130* 136 133* 130*  K 4.5 4.9 4.8 5.4*  CL 91* 100* 97* 93*  CO2 24 23 22  19*  GLUCOSE 93 98 79 100*  BUN 46* 26* 39* 48*  CALCIUM 8.7* 8.9 8.7* 8.7*  CREATININE 6.22* 4.42* 5.85* 7.19*  GFRNONAA 9* 14* 10* 8*  GFRAA 11* 16* 12* 9*    LIVER FUNCTION TESTS: Recent Labs    12/17/17 0851  01/01/18 0814 01/03/18 1043 01/04/18 0357 01/05/18 0330  BILITOT 0.8  --   --  3.1* 2.3* 1.7*  AST 20  --   --  79* 52*  68*  ALT 13*  --   --  60 48 53  ALKPHOS 253*  --   --  547* 497* 663*  PROT 6.3*  --   --  6.7 6.1* 6.0*  ALBUMIN 3.1*   < > 3.0* 2.9* 2.7* 2.8*   < > = values in this interval not displayed.    Assessment and Plan:  Chole drain to remain 6-8 weeks Plan per [facility] Need to flush 10 cc sterile saline daily at home [facility] being arranged per pt IR can follow Order placed for follow up if pt able (he is going to SNF in [place])  Electronically Signed: Jahzion Brogden A, PA-C 01/06/2018, 10:51 AM   I spent a total of 15 Minutes at the the patient's bedside AND on the patient's hospital floor or unit, greater than  50% of which was counseling/coordinating care for percutaneous chole drain

## 2018-01-06 NOTE — Care Management Note (Signed)
Case Management Note  Patient Details  Name: ADITHYA DIFRANCESCO MRN: [id number] Date of Birth: Dec 17, 1965  Subjective/Objective:                    Action/Plan: Received pt back from ICU. He is s/p chole drain and on IV antibiotics. Plan continues to be [facility] for rehab and [facility] in [place] for HD once pt is medically stable for d/c. CM following.   Expected Discharge Date:                  Expected Discharge Plan:  Skilled Nursing Facility  In-House Referral:  Clinical Social Work  Discharge planning Services  CM Consult  Post Acute Care Choice:    Choice offered to:     DME Arranged:    DME Agency:     HH Arranged:    HH Agency:     Status of Service:  In process, will continue to follow  If discussed at Long Length of Stay Meetings, dates discussed:    Additional Comments:  Kermit Balo, RN 01/06/2018, 12:10 PM

## 2018-01-06 NOTE — Progress Notes (Signed)
[facility] Progress Note  Subjective: sp chole tube yest, feeling better. BPs' stable.   Vitals:   01/06/18 0835 01/06/18 0853 01/06/18 0900 01/06/18 0930  BP: (!) 153/79 (!) 168/77 (!) 163/83 (!) 151/77  Pulse: 64 72 66 64  Resp: 18 18 16 16   Temp: 98.7 F (37.1 C)     TempSrc: Oral     SpO2: 92%     Weight: 68.3 kg (150 lb 9.2 oz)     Height:        Inpatient medications: .  stroke: mapping our early stages of recovery book   Does not apply Once  . aspirin EC  81 mg Oral Daily  . calcitRIOL  0.75 mcg Oral Once per day on Tue Thu Sat  . calcium carbonate  1 tablet Oral Daily  . feeding supplement (NEPRO CARB STEADY)  237 mL Oral BID BM  . insulin aspart  0-9 Units Subcutaneous Q4H  . lanthanum  1,000 mg Oral TID WC  . multivitamin  1 tablet Oral QHS  . pantoprazole  40 mg Oral Daily  . polyethylene glycol  17 g Oral BID  . potassium chloride  40 mEq Oral BID  . senna-docusate  1 tablet Oral BID   . cefTRIAXone (ROCEPHIN)  IV Stopped (01/05/18 1547)   acetaminophen **OR** acetaminophen (TYLENOL) oral liquid 160 mg/5 mL **OR** acetaminophen, camphor-menthol, fentaNYL (SUBLIMAZE) injection, labetalol, ondansetron (ZOFRAN) IV **OR** [DISCONTINUED] promethazine, RESOURCE THICKENUP CLEAR, tiZANidine  Exam: Alert, no distress, L hemi Chest cta bilat RRR no mrg Abd soft tender RUQ Ext no edema L AVF     Dialysis: TTS [facility] 4h    65kg (lower edw at dc)   2/2 bath  AVF LUE   Hep 2000 (no heparin x 4 wks = Feb 6, after CVA/ bleed) - Mircera 04-09-1977 IV q 2 weeks (last 1/3) - Calcitriol 0.68mcg PO q HD        Impression: 1  R CVA/ bleed 2  Acute cholecystitis sp chole tube 3  ESRD HD tts 4  Anemia ckd, Hb stable 5  MBD ckd, cont calcitriol, binder 6  HTN BP's up 7  DM2 8  Vol excess, wt's up  Plan - HD today, max UF, abx   70m MD [facility] pager [phone]   01/06/2018, 10:10 AM   Recent Labs  Lab 01/01/18 0814   01/04/18 0357 01/05/18 0330 01/06/18 0549  NA 129*   < > 136 133* 130*  K 4.2   < > 4.9 4.8 5.4*  CL 90*   < > 100* 97* 93*  CO2 24   < > 23 22 19*  GLUCOSE 114*   < > 98 79 100*  BUN 43*   < > 26* 39* 48*  CREATININE 6.26*   < > 4.42* 5.85* 7.19*  CALCIUM 8.9   < > 8.9 8.7* 8.7*  PHOS 5.0*  --   --  2.9 4.3   < > = values in this interval not displayed.   Recent Labs  Lab 01/03/18 1043 01/04/18 0357 01/05/18 0330  AST 79* 52* 68*  ALT 60 48 53  ALKPHOS 547* 497* 663*  BILITOT 3.1* 2.3* 1.7*  PROT 6.7 6.1* 6.0*  ALBUMIN 2.9* 2.7* 2.8*   Recent Labs  Lab 01/04/18 0357 01/05/18 0330 01/05/18 1555 01/06/18 0859  WBC 10.0 7.3 8.0 8.1  NEUTROABS 7.7 4.6  --   --   HGB 11.4* 11.1* 12.9* 11.0*  HCT  36.1* 35.1* 40.3 34.3*  MCV 92.6 91.2 91.2 88.9  PLT 282 275 291 281   Iron/TIBC/Ferritin/ %Sat    Component Value Date/Time   IRON 37 (L) 12/15/2017 1216   TIBC 239 (L) 12/15/2017 1216   FERRITIN 629 (H) 12/15/2017 1216   IRONPCTSAT 15 (L) 12/15/2017 1216

## 2018-01-06 NOTE — Progress Notes (Signed)
Occupational Therapy Treatment Patient Details Name: Wesley Burns MRN: [id number] DOB: 08-27-1966 Today's Date: 01/06/2018    History of present illness 52 y.o. male with ESRD, Type 2 DM, HTN, GERD, and Hx of Right basal ganglia CVA (2017), right transmet amputation. Admitted with right thalamic hemorrhagic stroke. s/p percutaneous chole tube placed 2/4. Also found to be positive for E.coli and enterobacter bacteremia.    OT comments  Pt seen in conjunction with Pt.  He demonstrates improving sitting and standing balance - requires min guard assist for EOB balance, and progressed to min A for standing balance for brief periods.   He demonstrates anterior subluxation of Lt shoulder, but no pain with PROM of shoulder as long as scapula is in correct alignment.   He demonstrates minimal active movement Rt UE (Brunnstrom beginning stage III)  Follow Up Recommendations  SNF    Equipment Recommendations  None recommended by OT    Recommendations for Other Services      Precautions / Restrictions Precautions Precautions: Fall Precaution Comments: R trans met, L toe amputations Restrictions Weight Bearing Restrictions: No       Mobility Bed Mobility Overal bed mobility: Needs Assistance Bed Mobility: Sit to Supine   Sidelying to sit: Mod assist;HOB elevated   Sit to supine: Mod assist   General bed mobility comments: assist to lift and lower trunk and Lt LE   Transfers Overall transfer level: Needs assistance Equipment used: 2 person hand held assist Transfers: Sit to/from Stand Sit to Stand: Mod assist;+2 physical assistance         General transfer comment: Pt requires assist for anterior translation of trunk over LEs, and to extend trunk and hips.  Cues/assist to extend Lt knee     Balance Overall balance assessment: Needs assistance Sitting-balance support: Feet supported;No upper extremity supported Sitting balance-Leahy Scale: Fair Sitting balance - Comments: Pt  able to maintain unsupported sitting EOB with min guard assist    Standing balance support: Bilateral upper extremity supported Standing balance-Leahy Scale: Poor Standing balance comment: Worked on static standing and shifting weight Lt <>Rt.  Pt requires cues/facilitation for hip extension as well as Lt knee extension.  Progressed to min A for static standing                            ADL either performed or assessed with clinical judgement   ADL Overall ADL's : Needs assistance/impaired     Grooming: Wash/dry hands;Wash/dry face;Oral care;Brushing hair;Min guard;Sitting Grooming Details (indicate cue type and reason): EOB                  Toilet Transfer: Moderate assistance;+2 for physical assistance;Stand-pivot;BSC           Functional mobility during ADLs: Minimal assistance;Moderate assistance;+2 for physical assistance       Vision       Perception     Praxis      Cognition Arousal/Alertness: Awake/alert Behavior During Therapy: WFL for tasks assessed/performed Overall Cognitive Status: Impaired/Different from baseline Area of Impairment: Safety/judgement;Problem solving;Attention                   Current Attention Level: Divided   Following Commands: Follows multi-step commands consistently Safety/Judgement: Decreased awareness of safety   Problem Solving: Slow processing;Requires verbal cues          Exercises Other Exercises Other Exercises: Pt reports someone told him his Lt shoulder is "dislocated" Pt  with positive anterior subluxation, Scap mobilization with realignment of shoulder performed.  Pt tolerated PROM shoulder flex to ~100* without pain and with good alignment of shoulder.  Explained subluxation and stroke to pt and [name], his partner.  Reinforced that no one should pull on that arm    Shoulder Instructions       General Comments      Pertinent Vitals/ Pain       Pain Assessment: Faces Faces Pain Scale:  Hurts a little bit Pain Location: right flank Pain Descriptors / Indicators: Sore Pain Intervention(s): Monitored during session  Home Living                                          Prior Functioning/Environment              Frequency  Min 2X/week        Progress Toward Goals  OT Goals(current goals can now be found in the care plan section)  Progress towards OT goals: Progressing toward goals;Goals met and updated - see care plan  Acute Rehab OT Goals Patient Stated Goal: go to rehab and get better  OT Goal Formulation: With patient Time For Goal Achievement: 01/20/18 Potential to Achieve Goals: Good ADL Goals Pt Will Perform Grooming: with set-up;sitting Pt Will Perform Upper Body Bathing: with min assist;sitting Pt Will Transfer to Toilet: with min assist;stand pivot transfer;squat pivot transfer;bedside commode Pt Will Perform Toileting - Clothing Manipulation and hygiene: with mod assist;sit to/from stand Additional ADL Goal #1: Pt will be independent with self ROM  Additional ADL Goal #2: Pt will use Lt UE as a support 75% of time with min facilitation   Plan Discharge plan remains appropriate    Co-evaluation    PT/OT/SLP Co-Evaluation/Treatment: Yes Reason for Co-Treatment: Complexity of the patient's impairments (multi-system involvement);For patient/therapist safety;To address functional/ADL transfers PT goals addressed during session: Mobility/safety with mobility;Balance;Strengthening/ROM OT goals addressed during session: ADL's and self-care      AM-PAC PT "6 Clicks" Daily Activity     Outcome Measure   Help from another person eating meals?: A Little Help from another person taking care of personal grooming?: A Little Help from another person toileting, which includes using toliet, bedpan, or urinal?: A Lot Help from another person bathing (including washing, rinsing, drying)?: A Lot Help from another person to put on and  taking off regular upper body clothing?: A Lot Help from another person to put on and taking off regular lower body clothing?: A Lot 6 Click Score: 14    End of Session    OT Visit Diagnosis: Unsteadiness on feet (R26.81);Muscle weakness (generalized) (M62.81);Hemiplegia and hemiparesis Hemiplegia - Right/Left: Left Hemiplegia - dominant/non-dominant: Non-Dominant Hemiplegia - caused by: Cerebral infarction   Activity Tolerance Patient tolerated treatment well   Patient Left in bed;with call bell/phone within reach;with family/visitor present   Nurse Communication          Time: 4008-6761 OT Time Calculation (min): 36 min  Charges: OT General Charges $OT Visit: 1 Visit OT Treatments $Neuromuscular Re-education: 8-22 mins  [facility], OTR/L [phone]     Lucille Passy M 01/06/2018, 3:03 PM

## 2018-01-06 NOTE — Progress Notes (Signed)
Physical Therapy Treatment Patient Details Name: Wesley Burns MRN: [id number] DOB: 1966/06/05 Today's Date: 01/06/2018    History of Present Illness 52 y.o. male with ESRD, Type 2 DM, HTN, GERD, and Hx of Right basal ganglia CVA (2017), right transmet amputation. Admitted with right thalamic hemorrhagic stroke. s/p percutaneous chole tube placed 2/4. Also found to be positive for E.coli and enterobacter bacteremia.     PT Comments    Patient progressing well towards PT goals. Seen with OT. Focused on transfers and standing balance with emphasize on weight shifting and activation of trunk/hip extensors with WB through LLE. Pt following simple 1-2 step commands consistently. Able to initiate extensors with cues and sustain for 1-2 mins but fatigues. Tone noted in LLE. Will continue to follow.   Follow Up Recommendations  Supervision/Assistance - 24 hour;SNF     Equipment Recommendations  None recommended by PT    Recommendations for Other Services       Precautions / Restrictions Precautions Precautions: Fall Precaution Comments: R trans met, L toe amputations Restrictions Weight Bearing Restrictions: No    Mobility  Bed Mobility Overal bed mobility: Needs Assistance Bed Mobility: Sit to Supine;Sidelying to Sit   Sidelying to sit: Mod assist;HOB elevated   Sit to supine: Mod assist   General bed mobility comments: assist to lift and lower trunk and Lt LE   Transfers Overall transfer level: Needs assistance Equipment used: 2 person hand held assist Transfers: Sit to/from Stand Sit to Stand: Mod assist;+2 physical assistance         General transfer comment: Pt requires assist for anterior translation of trunk over LEs, and to extend trunk and hips.  Cues/assist to extend Lt knee   Ambulation/Gait                 Stairs            Wheelchair Mobility    Modified Rankin (Stroke Patients Only) Modified Rankin (Stroke Patients Only) Pre-Morbid  Rankin Score: No symptoms Modified Rankin: Severe disability     Balance Overall balance assessment: Needs assistance Sitting-balance support: Feet supported;No upper extremity supported Sitting balance-Leahy Scale: Fair Sitting balance - Comments: Pt able to maintain unsupported sitting EOB with min guard assist    Standing balance support: Bilateral upper extremity supported Standing balance-Leahy Scale: Poor Standing balance comment: Worked on static standing and shifting weight Lt <>Rt.  Pt requires cues/facilitation for hip extension as well as Lt knee extension.  Progressed to min A for static standing.                             Cognition Arousal/Alertness: Awake/alert Behavior During Therapy: WFL for tasks assessed/performed Overall Cognitive Status: Impaired/Different from baseline Area of Impairment: Safety/judgement;Problem solving;Attention                   Current Attention Level: Divided   Following Commands: Follows multi-step commands consistently Safety/Judgement: Decreased awareness of safety   Problem Solving: Slow processing;Requires verbal cues General Comments: pt much improved this date and follow simple commands consistantly      Exercises Other Exercises Other Exercises: Pt reports someone told him his Lt shoulder is "dislocated" Pt with positive anterior subluxation, Scap mobilization with realignment of shoulder performed.  Pt tolerated PROM shoulder flex to ~100* without pain and with good alignment of shoulder.  Explained subluxation and stroke to pt and [name], his partner.  Reinforced that no one  should pull on that arm     General Comments General comments (skin integrity, edema, etc.): Partner present towards end of session.      Pertinent Vitals/Pain Pain Assessment: Faces Faces Pain Scale: Hurts a little bit Pain Location: right flank Pain Descriptors / Indicators: Sore Pain Intervention(s): Monitored during session     Home Living                      Prior Function            PT Goals (current goals can now be found in the care plan section) Acute Rehab PT Goals Patient Stated Goal: go to rehab and get better  Progress towards PT goals: Progressing toward goals    Frequency    Min 3X/week      PT Plan Current plan remains appropriate    Co-evaluation PT/OT/SLP Co-Evaluation/Treatment: Yes Reason for Co-Treatment: Complexity of the patient's impairments (multi-system involvement);For patient/therapist safety;To address functional/ADL transfers PT goals addressed during session: Mobility/safety with mobility;Balance;Strengthening/ROM OT goals addressed during session: ADL's and self-care      AM-PAC PT "6 Clicks" Daily Activity  Outcome Measure  Difficulty turning over in bed (including adjusting bedclothes, sheets and blankets)?: Unable Difficulty moving from lying on back to sitting on the side of the bed? : Unable Difficulty sitting down on and standing up from a chair with arms (e.g., wheelchair, bedside commode, etc,.)?: Unable Help needed moving to and from a bed to chair (including a wheelchair)?: A Lot Help needed walking in hospital room?: Total Help needed climbing 3-5 steps with a railing? : Total 6 Click Score: 7    End of Session Equipment Utilized During Treatment: Gait belt Activity Tolerance: Patient tolerated treatment well Patient left: in bed;with family/visitor present;with call bell/phone within reach Nurse Communication: Mobility status PT Visit Diagnosis: Unsteadiness on feet (R26.81);Other abnormalities of gait and mobility (R26.89);Hemiplegia and hemiparesis Hemiplegia - Right/Left: Left Hemiplegia - dominant/non-dominant: Non-dominant Hemiplegia - caused by: Other Nontraumatic intracranial hemorrhage     Time: 2725-3664 PT Time Calculation (min) (ACUTE ONLY): 35 min  Charges:  $Neuromuscular Re-education: 8-22 mins                    G  Codes:       Wray Kearns, PT, DPT [phone]     Marguarite Arbour A Earlin Sweeden 01/06/2018, 4:06 PM

## 2018-01-07 LAB — GLUCOSE, CAPILLARY
Glucose-Capillary: 104 mg/dL — ABNORMAL HIGH (ref 65–99)
Glucose-Capillary: 119 mg/dL — ABNORMAL HIGH (ref 65–99)
Glucose-Capillary: 164 mg/dL — ABNORMAL HIGH (ref 65–99)
Glucose-Capillary: 66 mg/dL (ref 65–99)
Glucose-Capillary: 75 mg/dL (ref 65–99)
Glucose-Capillary: 86 mg/dL (ref 65–99)
Glucose-Capillary: 90 mg/dL (ref 65–99)

## 2018-01-07 MED ORDER — AMLODIPINE BESYLATE 10 MG PO TABS
10.0000 mg | ORAL_TABLET | Freq: Every day | ORAL | Status: DC
  Administered 2018-01-08 – 2018-01-10 (×3): 10 mg via ORAL
  Filled 2018-01-07 (×3): qty 1

## 2018-01-07 MED ORDER — FAMOTIDINE 20 MG PO TABS
20.0000 mg | ORAL_TABLET | Freq: Two times a day (BID) | ORAL | Status: DC | PRN
  Administered 2018-01-08 – 2018-01-10 (×2): 20 mg via ORAL
  Filled 2018-01-07 (×3): qty 1

## 2018-01-07 NOTE — Progress Notes (Addendum)
[facility] Progress Note  Subjective: no new c/o's.    Vitals:   01/07/18 0507 01/07/18 0655 01/07/18 0826 01/07/18 0932  BP: (!) 170/75 (!) 162/89 (!) 183/82 (!) 174/86  Pulse: 69 73  72  Resp: 18   18  Temp: 98.2 F (36.8 C)   98.5 F (36.9 C)  TempSrc: Oral   Oral  SpO2: 98%   98%  Weight:      Height:        Inpatient medications: .  stroke: mapping our early stages of recovery book   Does not apply Once  . aspirin EC  81 mg Oral Daily  . calcitRIOL  0.75 mcg Oral Once per day on Tue Thu Sat  . calcium carbonate  1 tablet Oral Daily  . feeding supplement  1 Container Oral TID BM  . insulin aspart  0-9 Units Subcutaneous Q4H  . lanthanum  1,000 mg Oral TID WC  . multivitamin  1 tablet Oral QHS  . pantoprazole  40 mg Oral Daily  . polyethylene glycol  17 g Oral BID  . senna-docusate  1 tablet Oral BID   . cefTRIAXone (ROCEPHIN)  IV Stopped (01/06/18 1750)   acetaminophen **OR** acetaminophen (TYLENOL) oral liquid 160 mg/5 mL **OR** acetaminophen, benzonatate, camphor-menthol, fentaNYL (SUBLIMAZE) injection, labetalol, ondansetron (ZOFRAN) IV **OR** [DISCONTINUED] promethazine, RESOURCE THICKENUP CLEAR, tiZANidine  Exam: Alert, no distress, L hemi Chest cta bilat RRR no mrg Abd soft tender RUQ Ext no edema L AVF     Dialysis: TTS [facility] 4h    65kg (lower edw at dc, prob around 58kg)   2/2 bath  AVF LUE   Hep 2000 (no heparin x 4 wks = Feb 6, after CVA/ bleed) - Mircera IV q 2 weeks (last 1/3) - Calcitriol 0.31mcg PO q HD        Impression: 1  R CVA/ bleed 2  Acute cholecystitis sp chole tube 3  ESRD HD tts. No hep x 8 wks after CVA, ok to resume 02/04/18 4  Anemia ckd, Hb stable 5  MBD ckd, cont calcitriol, binder 6  HTN BP's up 7  DM2 8  Vol excess, wt's up still  Plan - HD Thursday, max UF   Vinson Moselle MD [facility] pager [phone]   01/07/2018, 11:49 AM   Recent Labs  Lab 01/01/18 0814  01/04/18 0357  01/05/18 0330 01/06/18 0549  NA 129*   < > 136 133* 130*  K 4.2   < > 4.9 4.8 5.4*  CL 90*   < > 100* 97* 93*  CO2 24   < > 23 22 19*  GLUCOSE 114*   < > 98 79 100*  BUN 43*   < > 26* 39* 48*  CREATININE 6.26*   < > 4.42* 5.85* 7.19*  CALCIUM 8.9   < > 8.9 8.7* 8.7*  PHOS 5.0*  --   --  2.9 4.3   < > = values in this interval not displayed.   Recent Labs  Lab 01/03/18 1043 01/04/18 0357 01/05/18 0330  AST 79* 52* 68*  ALT 60 48 53  ALKPHOS 547* 497* 663*  BILITOT 3.1* 2.3* 1.7*  PROT 6.7 6.1* 6.0*  ALBUMIN 2.9* 2.7* 2.8*   Recent Labs  Lab 01/04/18 0357 01/05/18 0330 01/05/18 1555 01/06/18 0859  WBC 10.0 7.3 8.0 8.1  NEUTROABS 7.7 4.6  --   --   HGB 11.4* 11.1* 12.9* 11.0*  HCT 36.1* 35.1* 40.3  34.3*  MCV 92.6 91.2 91.2 88.9  PLT 282 275 291 281   Iron/TIBC/Ferritin/ %Sat    Component Value Date/Time   IRON 37 (L) 12/15/2017 1216   TIBC 239 (L) 12/15/2017 1216   FERRITIN 629 (H) 12/15/2017 1216   IRONPCTSAT 15 (L) 12/15/2017 1216

## 2018-01-07 NOTE — Progress Notes (Signed)
Subjective/Chief Complaint: Pt comfortable    Objective: Vital signs in last 24 hours: Temp:  [97.5 F (36.4 C)-98.5 F (36.9 C)] 98.5 F (36.9 C) (02/06 0932) Pulse Rate:  [69-89] 72 (02/06 0932) Resp:  [16-18] 18 (02/06 0932) BP: (130-187)/(66-105) 174/86 (02/06 0932) SpO2:  [98 %-99 %] 98 % (02/06 0932) Weight:  [64.6 kg (142 lb 6.7 oz)] 64.6 kg (142 lb 6.7 oz) (02/05 1310) Last BM Date: 01/06/18  Intake/Output from previous day: 02/05 0701 - 02/06 0700 In: 480 [P.O.:480] Out: 4850 [Urine:250; Drains:100] Intake/Output this shift: Total I/O In: -  Out: 150 [Drains:150]  Incision/Wound:drain in place  Bile in bag   Lab Results:  Recent Labs    01/05/18 1555 01/06/18 0859  WBC 8.0 8.1  HGB 12.9* 11.0*  HCT 40.3 34.3*  PLT 291 281   BMET Recent Labs    01/05/18 0330 01/06/18 0549  NA 133* 130*  K 4.8 5.4*  CL 97* 93*  CO2 22 19*  GLUCOSE 79 100*  BUN 39* 48*  CREATININE 5.85* 7.19*  CALCIUM 8.7* 8.7*   PT/INR No results for input(s): LABPROT, INR in the last 72 hours. ABG No results for input(s): PHART, HCO3 in the last 72 hours.  Invalid input(s): PCO2, PO2  Studies/Results: Ir Perc Cholecystostomy  Result Date: 01/05/2018 INDICATION: 52 year old male with acute calculus cholecystitis. He is currently a poor operative candidate due to a relatively recent history of stroke (12/07/2017). He presents for placement of a percutaneous cholecystostomy tube. EXAM: CHOLECYSTOSTOMY MEDICATIONS: 400 mg ciprofloxacin; The antibiotic was administered within an appropriate time frame prior to the initiation of the procedure. ANESTHESIA/SEDATION: Moderate (conscious) sedation was employed during this procedure. A total of Versed 2 mg and Fentanyl 100 mcg was administered intravenously. Moderate Sedation Time: 16 minutes. The patient's level of consciousness and vital signs were monitored continuously by radiology nursing throughout the procedure under my direct  supervision. FLUOROSCOPY TIME:  Fluoroscopy Time: 0 minutes 42 seconds (1 mGy). COMPLICATIONS: None immediate. PROCEDURE: Informed written consent was obtained from the patient after a thorough discussion of the procedural risks, benefits and alternatives. All questions were addressed. Maximal Sterile Barrier Technique was utilized including caps, mask, sterile gowns, sterile gloves, sterile drape, hand hygiene and skin antiseptic. A timeout was performed prior to the initiation of the procedure. The right upper quadrant was interrogated with ultrasound. The gallbladder is thickened and contains internal sludge and small stones. The liver is echogenic. A suitable skin entry site was selected and marked. Local anesthesia was attained by infiltration with 1% lidocaine. A small dermatotomy was made. Under real-time sonographic guidance, the gallbladder lumen was punctured using a 21 gauge Accustick needle. The needle was passed through a short segment of hepatic parenchyma before entering the gallbladder lumen. A 0.018 wire was then coiled within the gallbladder lumen. The Accustick needle was removed and the Accustick sheath advanced over the wire and into the gallbladder lumen. A gentle hand injection of contrast material was performed opacifying the gallbladder lumen. An Amplatz wire was then advanced through the Accustick sheath which was exchanged and the skin tract dilated to 10 [place]. A Cook 10.2 [place] all-purpose drainage catheter was then advanced over the wire and formed in the gallbladder lumen. A final image was stored for the medical record. The catheter was secured to the skin with 0 Prolene suture and connected to gravity bag drainage. IMPRESSION: Successful placement of a transhepatic percutaneous cholecystostomy tube for acute calculus cholecystitis. PLAN: 1. Maintain tube  to gravity bag drainage. 2. Patient will require tube evaluation and exchange every 8 weeks until he becomes a suitable operative  candidate and can undergo definitive cholecystectomy. Signed, Sterling Big, MD [facility] Radiology Electronically Signed   By: Malachy Moan M.D.   On: 01/05/2018 17:27   Dg Shoulder Left  Result Date: 01/06/2018 CLINICAL DATA:  LEFT shoulder pain question dislocation, history of stroke, cannot feel or move LEFT arm EXAM: LEFT SHOULDER - 2+ VIEW COMPARISON:  Operative images of 09/04/2015 FINDINGS: Diffuse osseous demineralization. AC joint alignment normal. Mild inferior subluxation of LEFT humeral head. Old healed posttraumatic deformity of the surgical neck LEFT humerus with orthopedic hardware. One of the pegs at the LEFT humeral head extends beyond the cortical margin of the LEFT humeral head likely into the LEFT glenohumeral joint space. No acute fracture, dislocation, or bone destruction. IMPRESSION: Osseous demineralization without acute fracture or dislocation. A PEG of the orthopedic hardware at the LEFT humeral head extends extra osseous at the LEFT humeral head likely into the LEFT shoulder joint space. Electronically Signed   By: [name] M.D.   On: 01/06/2018 20:05    Anti-infectives: Anti-infectives (From admission, onward)   Start     Dose/Rate Route Frequency Ordered Stop   01/05/18 1445  cefTRIAXone (ROCEPHIN) 2 g in dextrose 5 % 50 mL IVPB     2 g 100 mL/hr over 30 Minutes Intravenous Every 24 hours 01/05/18 1437     01/05/18 1230  ciprofloxacin (CIPRO) IVPB 400 mg     400 mg 200 mL/hr over 60 Minutes Intravenous  Once 01/05/18 1152 01/05/18 1402   01/03/18 1600  meropenem (MERREM) 500 mg in sodium chloride 0.9 % 50 mL IVPB  Status:  Discontinued    Comments:  Pharmacy to adjust the dose   500 mg 100 mL/hr over 30 Minutes Intravenous Every 24 hours 01/03/18 1523 01/05/18 1437   01/03/18 1545  vancomycin (VANCOCIN) 1,500 mg in sodium chloride 0.9 % 500 mL IVPB     1,500 mg 250 mL/hr over 120 Minutes Intravenous   Once 01/03/18 1540 01/03/18 1943   01/03/18 1400  Ampicillin-Sulbactam (UNASYN) 3 g in sodium chloride 0.9 % 100 mL IVPB  Status:  Discontinued     3 g 200 mL/hr over 30 Minutes Intravenous Every 12 hours 01/03/18 1050 01/03/18 1523   01/03/18 1200  metroNIDAZOLE (FLAGYL) IVPB 500 mg  Status:  Discontinued     500 mg 100 mL/hr over 60 Minutes Intravenous Every 8 hours 01/03/18 1049 01/03/18 1526   01/03/18 1100  Ampicillin-Sulbactam (UNASYN) 3 g in sodium chloride 0.9 % 100 mL IVPB  Status:  Discontinued     3 g 200 mL/hr over 30 Minutes Intravenous Every 6 hours 01/03/18 1049 01/03/18 1050   01/03/18 0200  cefTRIAXone (ROCEPHIN) 2 g in dextrose 5 % 50 mL IVPB  Status:  Discontinued     2 g 100 mL/hr over 30 Minutes Intravenous Daily at bedtime 01/03/18 0126 01/03/18 1048      Assessment/Plan: Patient Active Problem List   Diagnosis Date Noted  . E coli and enterobacter bacteremia 01/03/2018  . Cholelithiasis with cholecystitis 01/03/2018  . Septic shock (HCC) 01/03/2018  . Abdominal pain   . Acute biliary pancreatitis   . ICH (intracerebral hemorrhage) (HCC) 12/17/2017  . Dysphagia, post-stroke   . Type 2 diabetes mellitus with peripheral neuropathy (HCC)   . History of tobacco abuse   . Benign essential  HTN   . ESRD on dialysis (HCC)   . History of traumatic brain injury   . History of CVA with residual deficit   . History of transmetatarsal amputation of foot (HCC)   . IVH (intraventricular hemorrhage) (HCC) 12/07/2017  . Complication of vascular dialysis catheter 11/02/2016  . Lactic acidosis 11/02/2016  . Left knee pain   . Urinary retention   . Pleural effusion   . Chest pain on respiration   . Shortness of breath   . Peripheral edema   . Labile blood pressure   . Sleep disturbance   . Left hemiparesis (HCC) 07/04/2016  . Acute on chronic renal failure (HCC)   . Normochromic normocytic anemia   . DM type 2 with diabetic peripheral neuropathy (HCC)   . Hx of  amputation   . Adjustment disorder with mixed anxiety and depressed mood   . Marijuana abuse   . Acute blood loss anemia   . Anemia of chronic disease   . Bowel incontinence   . Tobacco abuse   . Low grade fever   . Hypomagnesemia   . Right knee pain   . Diarrhea   . Spasticity   . Nontraumatic acute hemorrhage of basal ganglia (HCC) 06/28/2016  . Intracerebral hemorrhage (HCC) 06/28/2016  . Cytotoxic brain edema (HCC) 06/28/2016  . Brain herniation (HCC) 06/28/2016  . Fracture, shoulder 09/04/2015  . ESRD (end stage renal disease) on dialysis (HCC)   . Alcoholic cirrhosis of liver with ascites (HCC)   . Alcohol abuse 04/24/2015  . Hypokalemia 04/21/2015  . Anasarca   . Hyponatremia   . Dislocated hip (HCC) 12/24/2013  . 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 History of total hip replacement 12/24/2013  . Proteinuria 08/31/2013  . Microalbuminuria 07/30/2013  . Charcot foot due to diabetes mellitus (HCC) 07/30/2013  . Noncompliance with medications 07/30/2013  . Essential hypertension 04/06/2012  . Essential hypertension, benign 04/06/2012    Not operative candidate due to recent CVA Follow up with IR for drain evaluation as directed  May be able to remove in 6 - 8 weeks and treat nonoperatively Ok for placement  Available as needed    LOS: 31 days    Maisie Fus A Zeriah Baysinger 01/07/2018

## 2018-01-07 NOTE — Progress Notes (Signed)
PROGRESS NOTE    Wesley Burns  OMV:[id number] DOB: 1966/06/18 DOA: 12/07/2017 PCP: Darrow Bussing, MD   Brief Narrative:  Patient is a 52 y.o. y/o male with history of stroke in 2017, end-stage renal disease, hypertension, diabetes mellitus type 2, hyperlipidemia , presents to the ED with intracranial hemorrhage and left-side weakness on 12/07/2017. He was hospitalized for post-stroke rehabilitation while waiting for SNF placement.  However he developed acute pancreatitis and bacteremia due to acute cholecystitis last Saturday on 2/2 and was septic.  Blood culture indicates E coli and Enterobacter, he was started on meropenem and vancomycin. Percutaneous cholecystomy was placed on Sunday 2/3, antibiotics narrowed down to ceftriaxone on 2/4, and his conditions have improved.    Assessment & Plan:   Principal Problem:   E coli and enterobacter bacteremia Active Problems:   IVH (intraventricular hemorrhage) (HCC)   Dysphagia, post-stroke   Type 2 diabetes mellitus with peripheral neuropathy (HCC)   History of tobacco abuse   Benign essential HTN   ESRD on dialysis (HCC)   History of traumatic brain injury   History of CVA with residual deficit   History of transmetatarsal amputation of foot (HCC)   ICH (intracerebral hemorrhage) (HCC)   Cholelithiasis with cholecystitis   Septic shock (HCC)   Abdominal pain   Acute biliary pancreatitis   Septic shock due to bacteremia in the setting of acute cholecystitis -Septic shock resolved -Percutaneous cholecystomy was placed by IR on Sunday per surgery -Patient denies fever/chill, cough, difficulty breathing, chest pain. Reports improvement from past couple days. Temperature stable, WBC within normal limits -Currently on ceftriaxone (day 4), will continue for 10 more days to complete 14-days antibiotic treatment due to bacteremia -Per surgery, follow up with IR for percutaneous cholecystomy drain evaluation, it may be removed in 6-8 weeks,  and patient is ok for SNF placement. -Patient requests diet change from clear liquid to solids, will change to solid diet due to POD 3  Left-sided weakness in the setting of intracerebral hemorrhage stroke  -Continue to experience left-sided weakness, able to move left thumb and perform +2 grip strength, however unable to wiggle toes.  Will continue follow up with neurology, PT, OT for further evaluation -Pending SNF placement, social worker / case manager notified.  Hypertension -BP 162/89 - 183/82 this morning, continue to give labetalol prn -Continue to monitor  ESRD with hemodialysis on T/Th/Sat -Continue lanthanum and hemodialysis   T2DM -A1C 4.0 on 12/08/17. Glucose well-controlled, continue sliding scale insulin  DVT prophylaxis:  SCD's  Code Status:  Full Family Communication:  No family at bedside this morning  Disposition Plan:  Waiting for SNF placement   Consultants:   Neurology  Surgery  Procedures: Antimicrobials: Ceftriaxone  Subjective: Patient appears calm, alert, oriented. Denies fever/chill, dizziness, chest pain, cough, difficulty breathing, abdominal pain, no changes in bowel habits.  Reports improvement from the past couple days, with some pain and tenderness at RUQ, and requests diet changes to solid  Objective: Vitals:   01/07/18 0507 01/07/18 0655 01/07/18 0826 01/07/18 0932  BP: (!) 170/75 (!) 162/89 (!) 183/82 (!) 174/86  Pulse: 69 73  72  Resp: 18   18  Temp: 98.2 F (36.8 C)   98.5 F (36.9 C)  TempSrc: Oral   Oral  SpO2: 98%   98%  Weight:      Height:        Intake/Output Summary (Last 24 hours) at 01/07/2018 1127 Last data filed at 01/07/2018 6283 Gross per 24  hour  Intake 480 ml  Output 4900 ml  Net -4420 ml   Filed Weights   01/05/18 0600 01/06/18 0835 01/06/18 1310  Weight: 67.1 kg (147 lb 14.9 oz) 68.3 kg (150 lb 9.2 oz) 64.6 kg (142 lb 6.7 oz)    Examination:  General exam: Appears calm and comfortable   E ENT: No  icterus, oral mucosa moist. Respiratory system: Clear to auscultation. Respiratory effort normal. No wheezing or crackle Cardiovascular system: S1 & S2 heard, RRR.  No pedal edema. Gastrointestinal system: Abdomen is nondistended, soft and nontender. Normal bowel sounds heard. Mild pain on RUQ upon light palpation and when coughing Central nervous system: Alert and oriented. No focal neurological deficits. Musculoskeletal/Extremities: No joint deformities. Right BKA. Left side weakness, slighty improved ROM on left thumb, unable to move left toes  Skin: No rashes, lesions or ulcers. Dry and warm to touch   Data Reviewed: I have personally reviewed following labs and imaging studies  CBC: Recent Labs  Lab 01/03/18 0547 01/04/18 0357 01/05/18 0330 01/05/18 1555 01/06/18 0859  WBC 7.9 10.0 7.3 8.0 8.1  NEUTROABS  --  7.7 4.6  --   --   HGB 11.3* 11.4* 11.1* 12.9* 11.0*  HCT 35.0* 36.1* 35.1* 40.3 34.3*  MCV 90.0 92.6 91.2 91.2 88.9  PLT 268 282 275 291 281   Basic Metabolic Panel: Recent Labs  Lab 01/01/18 0814 01/02/18 0402 01/03/18 0547 01/04/18 0357 01/05/18 0330 01/06/18 0549  NA 129* 136 130* 136 133* 130*  K 4.2 3.4* 4.5 4.9 4.8 5.4*  CL 90* 91* 91* 100* 97* 93*  CO2 24 25 24 23 22  19*  GLUCOSE 114* 120* 93 98 79 100*  BUN 43* 25* 46* 26* 39* 48*  CREATININE 6.26* 4.85* 6.22* 4.42* 5.85* 7.19*  CALCIUM 8.9 9.2 8.7* 8.9 8.7* 8.7*  MG  --   --   --  1.9  --  2.1  PHOS 5.0*  --   --   --  2.9 4.3   GFR: Estimated Creatinine Clearance: 10.9 mL/min (A) (by C-G formula based on SCr of 7.19 mg/dL (H)). Liver Function Tests: Recent Labs  Lab 01/01/18 0814 01/03/18 1043 01/04/18 0357 01/05/18 0330  AST  --  79* 52* 68*  ALT  --  60 48 53  ALKPHOS  --  547* 497* 663*  BILITOT  --  3.1* 2.3* 1.7*  PROT  --  6.7 6.1* 6.0*  ALBUMIN 3.0* 2.9* 2.7* 2.8*   Recent Labs  Lab 01/03/18 1043 01/04/18 0357 01/05/18 0330  LIPASE 1,663* 956* 658*   No results for  input(s): AMMONIA in the last 168 hours. Coagulation Profile: Recent Labs  Lab 01/04/18 0357  INR 1.13   Cardiac Enzymes: No results for input(s): CKTOTAL, CKMB, CKMBINDEX, TROPONINI in the last 168 hours. BNP (last 3 results) No results for input(s): PROBNP in the last 8760 hours. HbA1C: No results for input(s): HGBA1C in the last 72 hours. CBG: Recent Labs  Lab 01/06/18 1700 01/06/18 2017 01/07/18 0001 01/07/18 0406 01/07/18 0724  GLUCAP 133* 76 200* 75 90   Lipid Profile: No results for input(s): CHOL, HDL, LDLCALC, TRIG, CHOLHDL, LDLDIRECT in the last 72 hours. Thyroid Function Tests: No results for input(s): TSH, T4TOTAL, FREET4, T3FREE, THYROIDAB in the last 72 hours. Anemia Panel: No results for input(s): VITAMINB12, FOLATE, FERRITIN, TIBC, IRON, RETICCTPCT in the last 72 hours. Sepsis Labs: Recent Labs  Lab 01/03/18 1509  PROCALCITON 26.67  LATICACIDVEN 1.8  Recent Results (from the past 240 hour(s))  Culture, blood (Routine X 2) w Reflex to ID Panel     Status: Abnormal   Collection Time: 01/02/18  8:35 AM  Result Value Ref Range Status   Specimen Description BLOOD SITE NOT SPECIFIED  Final   Special Requests   Final    BOTTLES DRAWN AEROBIC AND ANAEROBIC Blood Culture adequate volume   Culture  Setup Time   Final    GRAM NEGATIVE RODS IN BOTH AEROBIC AND ANAEROBIC BOTTLES CRITICAL RESULT CALLED TO, READ BACK BY AND VERIFIED WITH: G ABOTT PHARMD 01/03/18 1252 JDW    Culture (A)  Final    ESCHERICHIA COLI SUSCEPTIBILITIES PERFORMED ON PREVIOUS CULTURE WITHIN THE LAST 5 DAYS. Performed at [facility], [address] [place], [place] [address]    Report Status 01/05/2018 FINAL  Final  Culture, blood (Routine X 2) w Reflex to ID Panel     Status: Abnormal   Collection Time: 01/02/18  8:36 AM  Result Value Ref Range Status   Specimen Description BLOOD SITE NOT SPECIFIED  Final   Special Requests   Final    BOTTLES DRAWN AEROBIC AND ANAEROBIC  Blood Culture adequate volume   Culture  Setup Time   Final    GRAM NEGATIVE RODS IN BOTH AEROBIC AND ANAEROBIC BOTTLES CRITICAL RESULT CALLED TO, READ BACK BY AND VERIFIED WITH: G ABOTT PHARMD 01/03/18 1252 JDW Performed at [facility], [address], [place], [place] [address]    Culture ESCHERICHIA COLI (A)  Final   Report Status 01/05/2018 FINAL  Final   Organism ID, Bacteria ESCHERICHIA COLI  Final      Susceptibility   Escherichia coli - MIC*    AMPICILLIN <=2 SENSITIVE Sensitive     CEFAZOLIN <=4 SENSITIVE Sensitive     CEFEPIME <=1 SENSITIVE Sensitive     CEFTAZIDIME <=1 SENSITIVE Sensitive     CEFTRIAXONE <=1 SENSITIVE Sensitive     CIPROFLOXACIN <=0.25 SENSITIVE Sensitive     GENTAMICIN <=1 SENSITIVE Sensitive     IMIPENEM <=0.25 SENSITIVE Sensitive     TRIMETH/SULFA <=20 SENSITIVE Sensitive     AMPICILLIN/SULBACTAM <=2 SENSITIVE Sensitive     PIP/TAZO <=4 SENSITIVE Sensitive     Extended ESBL NEGATIVE Sensitive     * ESCHERICHIA COLI  Blood Culture ID Panel (Reflexed)     Status: Abnormal   Collection Time: 01/02/18  8:36 AM  Result Value Ref Range Status   Enterococcus species NOT DETECTED NOT DETECTED Final   Listeria monocytogenes NOT DETECTED NOT DETECTED Final   Staphylococcus species NOT DETECTED NOT DETECTED Final   Staphylococcus aureus NOT DETECTED NOT DETECTED Final   Streptococcus species NOT DETECTED NOT DETECTED Final   Streptococcus agalactiae NOT DETECTED NOT DETECTED Final   Streptococcus pneumoniae NOT DETECTED NOT DETECTED Final   Streptococcus pyogenes NOT DETECTED NOT DETECTED Final   Acinetobacter baumannii NOT DETECTED NOT DETECTED Final   Enterobacteriaceae species DETECTED (A) NOT DETECTED Final    Comment: Enterobacteriaceae represent a large family of gram-negative bacteria, not a single organism. CRITICAL RESULT CALLED TO, READ BACK BY AND VERIFIED WITH: G ABOTT PHARMD 01/03/18 1252 JDW    Enterobacter cloacae complex NOT DETECTED  NOT DETECTED Final   Escherichia coli DETECTED (A) NOT DETECTED Final    Comment: CRITICAL RESULT CALLED TO, READ BACK BY AND VERIFIED WITH: G ABOTT PHARMD 01/03/18 1252 JDW    Klebsiella oxytoca NOT DETECTED NOT DETECTED Final  Klebsiella pneumoniae NOT DETECTED NOT DETECTED Final   Proteus species NOT DETECTED NOT DETECTED Final   Serratia marcescens NOT DETECTED NOT DETECTED Final   Carbapenem resistance NOT DETECTED NOT DETECTED Final   Haemophilus influenzae NOT DETECTED NOT DETECTED Final   Neisseria meningitidis NOT DETECTED NOT DETECTED Final   Pseudomonas aeruginosa NOT DETECTED NOT DETECTED Final   Candida albicans NOT DETECTED NOT DETECTED Final   Candida glabrata NOT DETECTED NOT DETECTED Final   Candida krusei NOT DETECTED NOT DETECTED Final   Candida parapsilosis NOT DETECTED NOT DETECTED Final   Candida tropicalis NOT DETECTED NOT DETECTED Final  MRSA PCR Screening     Status: None   Collection Time: 01/03/18  4:59 PM  Result Value Ref Range Status   MRSA by PCR NEGATIVE NEGATIVE Final    Comment:        The GeneXpert MRSA Assay (FDA approved for NASAL specimens only), is one component of a comprehensive MRSA colonization surveillance program. It is not intended to diagnose MRSA infection nor to guide or monitor treatment for MRSA infections. Performed at [facility], [address], [place], [place] [address]          Radiology Studies: Ir Perc Cholecystostomy  Result Date: 01/05/2018 INDICATION: 52 year old male with acute calculus cholecystitis. He is currently a poor operative candidate due to a relatively recent history of stroke (12/07/2017). He presents for placement of a percutaneous cholecystostomy tube. EXAM: CHOLECYSTOSTOMY MEDICATIONS: 400 mg ciprofloxacin; The antibiotic was administered within an appropriate time frame prior to the initiation of the procedure. ANESTHESIA/SEDATION: Moderate (conscious) sedation was employed during this  procedure. A total of Versed 2 mg and Fentanyl 100 mcg was administered intravenously. Moderate Sedation Time: 16 minutes. The patient's level of consciousness and vital signs were monitored continuously by radiology nursing throughout the procedure under my direct supervision. FLUOROSCOPY TIME:  Fluoroscopy Time: 0 minutes 42 seconds (1 mGy). COMPLICATIONS: None immediate. PROCEDURE: Informed written consent was obtained from the patient after a thorough discussion of the procedural risks, benefits and alternatives. All questions were addressed. Maximal Sterile Barrier Technique was utilized including caps, mask, sterile gowns, sterile gloves, sterile drape, hand hygiene and skin antiseptic. A timeout was performed prior to the initiation of the procedure. The right upper quadrant was interrogated with ultrasound. The gallbladder is thickened and contains internal sludge and small stones. The liver is echogenic. A suitable skin entry site was selected and marked. Local anesthesia was attained by infiltration with 1% lidocaine. A small dermatotomy was made. Under real-time sonographic guidance, the gallbladder lumen was punctured using a 21 gauge Accustick needle. The needle was passed through a short segment of hepatic parenchyma before entering the gallbladder lumen. A 0.018 wire was then coiled within the gallbladder lumen. The Accustick needle was removed and the Accustick sheath advanced over the wire and into the gallbladder lumen. A gentle hand injection of contrast material was performed opacifying the gallbladder lumen. An Amplatz wire was then advanced through the Accustick sheath which was exchanged and the skin tract dilated to 10 [place]. A Cook 10.2 [place] all-purpose drainage catheter was then advanced over the wire and formed in the gallbladder lumen. A final image was stored for the medical record. The catheter was secured to the skin with 0 Prolene suture and connected to gravity bag drainage.  IMPRESSION: Successful placement of a transhepatic percutaneous cholecystostomy tube for acute calculus cholecystitis. PLAN: 1. Maintain tube to gravity bag drainage. 2. Patient will require tube  evaluation and exchange every 8 weeks until he becomes a suitable operative candidate and can undergo definitive cholecystectomy. Signed, Sterling Big, MD [facility] Electronically Signed   By: Malachy Moan M.D.   On: 01/05/2018 17:27   Dg Shoulder Left  Result Date: 01/06/2018 CLINICAL DATA:  LEFT shoulder pain question dislocation, history of stroke, cannot feel or move LEFT arm EXAM: LEFT SHOULDER - 2+ VIEW COMPARISON:  Operative images of 09/04/2015 FINDINGS: Diffuse osseous demineralization. AC joint alignment normal. Mild inferior subluxation of LEFT humeral head. Old healed posttraumatic deformity of the surgical neck LEFT humerus with orthopedic hardware. One of the pegs at the LEFT humeral head extends beyond the cortical margin of the LEFT humeral head likely into the LEFT glenohumeral joint space. No acute fracture, dislocation, or bone destruction. IMPRESSION: Osseous demineralization without acute fracture or dislocation. A PEG of the orthopedic hardware at the LEFT humeral head extends extra osseous at the LEFT humeral head likely into the LEFT shoulder joint space. Electronically Signed   By: [name] M.D.   On: 01/06/2018 20:05        Scheduled Meds: .  stroke: mapping our early stages of recovery book   Does not apply Once  . aspirin EC  81 mg Oral Daily  . calcitRIOL  0.75 mcg Oral Once per day on Tue Thu Sat  . calcium carbonate  1 tablet Oral Daily  . feeding supplement  1 Container Oral TID BM  . insulin aspart  0-9 Units Subcutaneous Q4H  . lanthanum  1,000 mg Oral TID WC  . multivitamin  1 tablet Oral QHS  . pantoprazole  40 mg Oral Daily  . polyethylene glycol  17 g Oral BID  . senna-docusate  1 tablet  Oral BID   Continuous Infusions: . cefTRIAXone (ROCEPHIN)  IV Stopped (01/06/18 1750)     LOS: 31 days    Jo-ku Loraine Leriche) Hillery Aldo, PA-S

## 2018-01-07 NOTE — Progress Notes (Signed)
Physical Therapy Treatment Patient Details Name: [name] MRN: [id number] DOB: October 13, 1966 Today's Date: 01/07/2018    History of Present Illness 52 y.o. male with ESRD, Type 2 DM, HTN, GERD, and Hx of Right basal ganglia CVA (2017), right transmet amputation. Admitted with right thalamic hemorrhagic stroke. s/p percutaneous chole tube placed 2/4. Also found to be positive for E.coli and enterobacter bacteremia.     PT Comments    Pt is limited in participation today by increased stomach pain and diarrhea. Pt did not want to mobilize out of the bed however was agreeable to bed level exercise. PT will continue to follow acutely.   Follow Up Recommendations  Supervision/Assistance - 24 hour;SNF     Equipment Recommendations  None recommended by PT    Recommendations for Other Services       Precautions / Restrictions Precautions Precautions: Fall Precaution Comments: R trans met, L toe amputations Restrictions Weight Bearing Restrictions: No          Cognition Arousal/Alertness: Awake/alert Behavior During Therapy: WFL for tasks assessed/performed                                          Exercises General Exercises - Lower Extremity Ankle Circles/Pumps: AROM;Right;AAROM;Left;10 reps;Supine Quad Sets: AROM;Right;AAROM;Left;10 reps;Supine Gluteal Sets: AROM;Right;AAROM;Left;10 reps;Supine Heel Slides: AROM;Right;AAROM;Left;10 reps;Supine(increased tone in L knee flexion and extension ) Hip ABduction/ADduction: AROM;Right;AAROM;Left;10 reps;Supine Straight Leg Raises: AROM;Right;AAROM;Left;10 reps;Supine    General Comments General comments (skin integrity, edema, etc.): Pt experiencing severe diarrhea and stomach pain and requests not to get out of bed today      Pertinent Vitals/Pain Pain Assessment: 0-10 Pain Score: 7  Pain Location: stomach  Pain Descriptors / Indicators: Sore Pain Intervention(s): Limited activity within patient's  tolerance;Monitored during session;Repositioned           PT Goals (current goals can now be found in the care plan section) Acute Rehab PT Goals PT Goal Formulation: With patient Time For Goal Achievement: 01/05/18 Potential to Achieve Goals: Good Progress towards PT goals: PT to reassess next treatment    Frequency    Min 3X/week      PT Plan Current plan remains appropriate    Co-evaluation PT/OT/SLP Co-Evaluation/Treatment: Yes            AM-PAC PT "6 Clicks" Daily Activity  Outcome Measure  Difficulty turning over in bed (including adjusting bedclothes, sheets and blankets)?: Unable Difficulty moving from lying on back to sitting on the side of the bed? : Unable Difficulty sitting down on and standing up from a chair with arms (e.g., wheelchair, bedside commode, etc,.)?: Unable Help needed moving to and from a bed to chair (including a wheelchair)?: A Lot Help needed walking in hospital room?: Total Help needed climbing 3-5 steps with a railing? : Total 6 Click Score: 7    End of Session Equipment Utilized During Treatment: Gait belt Activity Tolerance: Patient limited by pain Patient left: in bed;with call bell/phone within reach Nurse Communication: Mobility status PT Visit Diagnosis: Unsteadiness on feet (R26.81);Other abnormalities of gait and mobility (R26.89);Hemiplegia and hemiparesis Hemiplegia - Right/Left: Left Hemiplegia - dominant/non-dominant: Non-dominant Hemiplegia - caused by: Other Nontraumatic intracranial hemorrhage     Time: 5974-1638 PT Time Calculation (min) (ACUTE ONLY): 12 min  Charges:  $Therapeutic Exercise: 8-22 mins  G Codes:       Navil Kole B. Migdalia Dk PT, DPT Acute Rehabilitation  [phone] Pager [phone]     [place] 01/07/2018, 2:24 PM

## 2018-01-07 NOTE — Progress Notes (Signed)
Pt had elevated SBP 180s. PRN labetelol given, MD notified. AV fistula began oozing, nephrology notified. Will continue to monitor  Verbal orders given to hold moderate pressure on fistula for 5-10 minutes until bleeding stops. Bleeding stopped after 7 minutes.

## 2018-01-08 DIAGNOSIS — K819 Cholecystitis, unspecified: Secondary | ICD-10-CM

## 2018-01-08 LAB — BASIC METABOLIC PANEL
ANION GAP: 17 — AB (ref 5–15)
BUN: 29 mg/dL — AB (ref 6–20)
CO2: 22 mmol/L (ref 22–32)
Calcium: 8.9 mg/dL (ref 8.9–10.3)
Chloride: 93 mmol/L — ABNORMAL LOW (ref 101–111)
Creatinine, Ser: 6.45 mg/dL — ABNORMAL HIGH (ref 0.61–1.24)
GFR calc Af Amer: 10 mL/min — ABNORMAL LOW (ref >60)
GFR calc non Af Amer: 9 mL/min — ABNORMAL LOW (ref >60)
GLUCOSE: 109 mg/dL — AB (ref 65–99)
POTASSIUM: 4.5 mmol/L (ref 3.5–5.1)
Sodium: 132 mmol/L — ABNORMAL LOW (ref 135–145)

## 2018-01-08 LAB — CBC
HEMATOCRIT: 38.6 % — AB (ref 39.0–52.0)
HEMOGLOBIN: 12.4 g/dL — AB (ref 13.0–17.0)
MCH: 28.8 pg (ref 26.0–34.0)
MCHC: 32.1 g/dL (ref 30.0–36.0)
MCV: 89.6 fL (ref 78.0–100.0)
Platelets: 337 10*3/uL (ref 150–400)
RBC: 4.31 MIL/uL (ref 4.22–5.81)
RDW: 18.4 % — AB (ref 11.5–15.5)
WBC: 8.2 10*3/uL (ref 4.0–10.5)

## 2018-01-08 LAB — GLUCOSE, CAPILLARY
GLUCOSE-CAPILLARY: 119 mg/dL — AB (ref 65–99)
GLUCOSE-CAPILLARY: 110 mg/dL — AB (ref 65–99)
GLUCOSE-CAPILLARY: 193 mg/dL — AB (ref 65–99)
GLUCOSE-CAPILLARY: 132 mg/dL — AB (ref 65–99)
Glucose-Capillary: 158 mg/dL — ABNORMAL HIGH (ref 65–99)

## 2018-01-08 MED ORDER — CALCITRIOL 0.25 MCG PO CAPS
ORAL_CAPSULE | ORAL | Status: AC
  Filled 2018-01-08: qty 1

## 2018-01-08 MED ORDER — PENTAFLUOROPROP-TETRAFLUOROETH EX AERO
INHALATION_SPRAY | CUTANEOUS | Status: AC
  Administered 2018-01-08: 08:00:00
  Filled 2018-01-08: qty 103.5

## 2018-01-08 MED ORDER — LABETALOL HCL 5 MG/ML IV SOLN
5.0000 mg | Freq: Once | INTRAVENOUS | Status: AC
  Administered 2018-01-08: 5 mg via INTRAVENOUS
  Filled 2018-01-08 (×2): qty 4

## 2018-01-08 MED ORDER — OXYCODONE-ACETAMINOPHEN 5-325 MG PO TABS
1.0000 | ORAL_TABLET | Freq: Four times a day (QID) | ORAL | Status: DC | PRN
  Administered 2018-01-10 – 2018-01-11 (×5): 1 via ORAL
  Filled 2018-01-08 (×5): qty 1

## 2018-01-08 MED ORDER — CALCITRIOL 0.5 MCG PO CAPS
ORAL_CAPSULE | ORAL | Status: AC
  Filled 2018-01-08: qty 1

## 2018-01-08 NOTE — Progress Notes (Signed)
PT Cancellation Note  Patient Details Name: Wesley Burns MRN: [id number] DOB: 01-25-1966   Cancelled Treatment:    Reason Eval/Treat Not Completed: (P) Patient at procedure or test/unavailable Pt at hemodialysis. PT will try to follow back this afternoon.  Bobbiejo Ishikawa B. Beverely Risen PT, DPT Acute Rehabilitation  [phone] Pager [phone]     Elon Alas Fleet 01/08/2018, 9:34 AM

## 2018-01-08 NOTE — Progress Notes (Signed)
PT Cancellation Note  Patient Details Name: Wesley Burns MRN: [id number] DOB: May 10, 1966   Cancelled Treatment:    Reason Eval/Treat Not Completed: (P) Fatigue/lethargy limiting ability to participate Pt reports feeling very tired after hemodialysis today and requests to defer PT until tomorrow.  Nada Godley B. Beverely Risen PT, DPT Acute Rehabilitation  [phone] Pager [phone]     Elon Alas Fleet 01/08/2018, 3:59 PM

## 2018-01-08 NOTE — Progress Notes (Signed)
Patient had episode of vomitus, around 2200 he had just received 2200 medications and fentanyl for pain the later I feel precipitated the vomiting, he feel asleep before I could give him Pepcid and Zofran, will wait for him to awaken and re assess for those two medications.

## 2018-01-08 NOTE — Progress Notes (Signed)
[facility] Progress Note  Subjective: diarrhea w/ some abd cramps overnight, no fevers, no SOB  Vitals:   01/08/18 0900 01/08/18 0930 01/08/18 1000 01/08/18 1030  BP: (!) 181/96 (!) 187/91 (!) 186/93 (!) 157/95  Pulse: 75 76 86 85  Resp: 15 16 17 16   Temp:      TempSrc:      SpO2:      Weight:      Height:        Inpatient medications: .  stroke: mapping our early stages of recovery book   Does not apply Once  . amLODipine  10 mg Oral Daily  . aspirin EC  81 mg Oral Daily  . calcitRIOL  0.75 mcg Oral Once per day on Tue Thu Sat  . calcium carbonate  1 tablet Oral Daily  . feeding supplement  1 Container Oral TID BM  . insulin aspart  0-9 Units Subcutaneous Q4H  . lanthanum  1,000 mg Oral TID WC  . multivitamin  1 tablet Oral QHS  . pantoprazole  40 mg Oral Daily  . polyethylene glycol  17 g Oral BID  . senna-docusate  1 tablet Oral BID   . cefTRIAXone (ROCEPHIN)  IV Stopped (01/07/18 1600)   acetaminophen **OR** acetaminophen (TYLENOL) oral liquid 160 mg/5 mL **OR** acetaminophen, benzonatate, camphor-menthol, famotidine, labetalol, ondansetron (ZOFRAN) IV **OR** [DISCONTINUED] promethazine, oxyCODONE-acetaminophen, RESOURCE THICKENUP CLEAR, tiZANidine  Exam: Alert, no distress, L hemi Chest cta bilat RRR no mrg Abd soft tender RUQ Ext no edema L AVF     Dialysis: TTS [facility] 4h    65kg (lower edw at dc, prob around 58kg)   2/2 bath  AVF LUE   Hep 2000 (no heparin x 4 wks = Feb 6, after CVA/ bleed) - Mircera 04-09-1977 IV q 2 weeks (last 1/3) - Calcitriol 0.82mcg PO q HD        Impression: 1  R CVA/ bleed 2  Acute cholecystitis sp chole tube 3  ESRD HD tts. No hep x 8 wks after CVA, will resume 02/04/18 4  Anemia ckd, Hb stable 5  MBD ckd, cont calcitriol, binder 6  HTN BP's up 7  DM2 8  Vol excess, recur issue  Plan - HD today, max UF   04/06/18 MD [facility] pager [phone]   01/08/2018, 10:44 AM   Recent Labs  Lab  01/05/18 0330 01/06/18 0549 01/08/18 0522  NA 133* 130* 132*  K 4.8 5.4* 4.5  CL 97* 93* 93*  CO2 22 19* 22  GLUCOSE 79 100* 109*  BUN 39* 48* 29*  CREATININE 5.85* 7.19* 6.45*  CALCIUM 8.7* 8.7* 8.9  PHOS 2.9 4.3  --    Recent Labs  Lab 01/03/18 1043 01/04/18 0357 01/05/18 0330  AST 79* 52* 68*  ALT 60 48 53  ALKPHOS 547* 497* 663*  BILITOT 3.1* 2.3* 1.7*  PROT 6.7 6.1* 6.0*  ALBUMIN 2.9* 2.7* 2.8*   Recent Labs  Lab 01/04/18 0357 01/05/18 0330 01/05/18 1555 01/06/18 0859 01/08/18 0522  WBC 10.0 7.3 8.0 8.1 8.2  NEUTROABS 7.7 4.6  --   --   --   HGB 11.4* 11.1* 12.9* 11.0* 12.4*  HCT 36.1* 35.1* 40.3 34.3* 38.6*  MCV 92.6 91.2 91.2 88.9 89.6  PLT 282 275 291 281 337   Iron/TIBC/Ferritin/ %Sat    Component Value Date/Time   IRON 37 (L) 12/15/2017 1216   TIBC 239 (L) 12/15/2017 1216   FERRITIN 629 (H) 12/15/2017 1216  IRONPCTSAT 15 (L) 12/15/2017 1216

## 2018-01-08 NOTE — Progress Notes (Signed)
Patients SBP was 166 at 0156 gave him 10 mg of labetalol and his next SBP was 173. This is like earlier yesterday and the nurse notified and the attending told her to hold off and his SBP came down at the next check, I will notify he asymptomatic at this time.

## 2018-01-08 NOTE — Progress Notes (Signed)
PROGRESS NOTE [facility]   Wesley Burns   OQH:[id number] DOB: [birth year]  DOA: 12/07/2017 PCP: Darrow Bussing, MD   Brief Narrative:  Wesley Burns  is a51 y.o.y/o malewith history of stroke in 2017, end-stage renal disease, hypertension, diabetes mellitus type 2, hyperlipidemia, presents to the ED with intracranial hemorrhage and left-side weakness on 12/07/2017.  During hospital stay developed acute pancreatitis, subsequently found to have bacteremia due to acute cholecystitis, patient underwent percutaneous cholecystectomy.  Patient been treated for E. coli and Enterobacter bacteremia.  Subjective: Patient seen and examined during dialysis section he reported having diarrhea over 9 which have succeeded at this point.  No other complaints.  Assessment & Plan: E. coli bacteremia due to acute cholecystitis Patient treated with multiple antibiotics including meropenem, Unasyn and Rocephin At this point will continue Rocephin for total of 14 days - will discuss with ID if this can be oral treatment Repeat blood cultures Remains afebrile  Septic shock due to bacteremia Sepsis physiology resolved See above  ICH Residual left-sided weakness Neurology follow-up SNF for rehab Avoid a/c medication  Acute cholecystitis Was felt the patient was not a candidate for surgical procedure IR was consulted and patient is now status post percutaneous cholecystectomy Draining well Postsurgery recommending follow-up as an outpatient in 6-8-week for definitive treatment  Hypertension  Stable, needs close blood pressure control due to ICH Continue current treatment Labetalol as needed  End-stage renal disease HD per nephrologist  Type 2 diabetes mellitus -well controlled A1c 4.0 Continue sliding scale for now  Diarrhea No characteristics of C. difficile at this time, if discontinue will send stool sample.  For now continue symptomatic support.  DVT prophylaxis:  SCDs Code Status: Full code Family Communication: None at bedside Disposition Plan: SNF in 2-3 days  Consultants:   Neurology  Surgery  IR  Nephrology  Critical care  Procedures:   Percutaneous cystectomy  Antimicrobials: Anti-infectives (From admission, onward)   Start     Dose/Rate Route Frequency Ordered Stop   01/05/18 1445  cefTRIAXone (ROCEPHIN) 2 g in dextrose 5 % 50 mL IVPB     2 g 100 mL/hr over 30 Minutes Intravenous Every 24 hours 01/05/18 1437     01/05/18 1230  ciprofloxacin (CIPRO) IVPB 400 mg     400 mg 200 mL/hr over 60 Minutes Intravenous  Once 01/05/18 1152 01/05/18 1402   01/03/18 1600  meropenem (MERREM) 500 mg in sodium chloride 0.9 % 50 mL IVPB  Status:  Discontinued    Comments:  Pharmacy to adjust the dose   500 mg 100 mL/hr over 30 Minutes Intravenous Every 24 hours 01/03/18 1523 01/05/18 1437   01/03/18 1545  vancomycin (VANCOCIN) 1,500 mg in sodium chloride 0.9 % 500 mL IVPB     1,500 mg 250 mL/hr over 120 Minutes Intravenous  Once 01/03/18 1540 01/03/18 1943   01/03/18 1400  Ampicillin-Sulbactam (UNASYN) 3 g in sodium chloride 0.9 % 100 mL IVPB  Status:  Discontinued     3 g 200 mL/hr over 30 Minutes Intravenous Every 12 hours 01/03/18 1050 01/03/18 1523   01/03/18 1200  metroNIDAZOLE (FLAGYL) IVPB 500 mg  Status:  Discontinued     500 mg 100 mL/hr over 60 Minutes Intravenous Every 8 hours 01/03/18 1049 01/03/18 1526   01/03/18 1100  Ampicillin-Sulbactam (UNASYN) 3 g in sodium chloride 0.9 % 100 mL IVPB  Status:  Discontinued     3 g 200 mL/hr over 30 Minutes Intravenous Every 6 hours 01/03/18  1049 01/03/18 1050   01/03/18 0200  cefTRIAXone (ROCEPHIN) 2 g in dextrose 5 % 50 mL IVPB  Status:  Discontinued     2 g 100 mL/hr over 30 Minutes Intravenous Daily at bedtime 01/03/18 0126 01/03/18 1048           Objective: Vitals:   01/08/18 1145 01/08/18 1158 01/08/18 1213 01/08/18 1426  BP: 126/75 109/75 130/84 (!) 146/84  Pulse: 100  100 94 87  Resp:  15 16 16   Temp:   97.9 F (36.6 C) 98.4 F (36.9 C)  TempSrc:   Oral Oral  SpO2:   98% 98%  Weight:   61 kg (134 lb 7.7 oz)   Height:        Intake/Output Summary (Last 24 hours) at 01/08/2018 1811 Last data filed at 01/08/2018 1752 Gross per 24 hour  Intake -  Output 3200 ml  Net -3200 ml   Filed Weights   01/06/18 1310 01/08/18 0810 01/08/18 1213  Weight: 64.6 kg (142 lb 6.7 oz) 64 kg (141 lb 1.5 oz) 61 kg (134 lb 7.7 oz)    Examination:  General: Pt is alert, awake, not in acute distress Cardiovascular: RRR, S1/S2 +, no rubs, no gallops Respiratory: CTA bilaterally, no wheezing, no rhonchi Abdominal: Soft, NT, ND, bowel sounds +, drain in place bloody Extremities: Left-sided hemiparesis   Data Reviewed: I have personally reviewed following labs and imaging studies  CBC: Recent Labs  Lab 01/04/18 0357 01/05/18 0330 01/05/18 1555 01/06/18 0859 01/08/18 0522  WBC 10.0 7.3 8.0 8.1 8.2  NEUTROABS 7.7 4.6  --   --   --   HGB 11.4* 11.1* 12.9* 11.0* 12.4*  HCT 36.1* 35.1* 40.3 34.3* 38.6*  MCV 92.6 91.2 91.2 88.9 89.6  PLT 282 275 291 281 337   Basic Metabolic Panel: Recent Labs  Lab 01/03/18 0547 01/04/18 0357 01/05/18 0330 01/06/18 0549 01/08/18 0522  NA 130* 136 133* 130* 132*  K 4.5 4.9 4.8 5.4* 4.5  CL 91* 100* 97* 93* 93*  CO2 24 23 22  19* 22  GLUCOSE 93 98 79 100* 109*  BUN 46* 26* 39* 48* 29*  CREATININE 6.22* 4.42* 5.85* 7.19* 6.45*  CALCIUM 8.7* 8.9 8.7* 8.7* 8.9  MG  --  1.9  --  2.1  --   PHOS  --   --  2.9 4.3  --    GFR: Estimated Creatinine Clearance: 11.4 mL/min (A) (by C-G formula based on SCr of 6.45 mg/dL (H)). Liver Function Tests: Recent Labs  Lab 01/03/18 1043 01/04/18 0357 01/05/18 0330  AST 79* 52* 68*  ALT 60 48 53  ALKPHOS 547* 497* 663*  BILITOT 3.1* 2.3* 1.7*  PROT 6.7 6.1* 6.0*  ALBUMIN 2.9* 2.7* 2.8*   Recent Labs  Lab 01/03/18 1043 01/04/18 0357 01/05/18 0330  LIPASE 1,663* 956* 658*    No results for input(s): AMMONIA in the last 168 hours. Coagulation Profile: Recent Labs  Lab 01/04/18 0357  INR 1.13   Cardiac Enzymes: No results for input(s): CKTOTAL, CKMB, CKMBINDEX, TROPONINI in the last 168 hours. BNP (last 3 results) No results for input(s): PROBNP in the last 8760 hours. HbA1C: No results for input(s): HGBA1C in the last 72 hours. CBG: Recent Labs  Lab 01/08/18 0004 01/08/18 0400 01/08/18 0739 01/08/18 1118 01/08/18 1721  GLUCAP 104* 119* 110* 158* 193*   Lipid Profile: No results for input(s): CHOL, HDL, LDLCALC, TRIG, CHOLHDL, LDLDIRECT in the last 72 hours. Thyroid Function Tests: No  results for input(s): TSH, T4TOTAL, FREET4, T3FREE, THYROIDAB in the last 72 hours. Anemia Panel: No results for input(s): VITAMINB12, FOLATE, FERRITIN, TIBC, IRON, RETICCTPCT in the last 72 hours. Sepsis Labs: Recent Labs  Lab 01/03/18 1509  PROCALCITON 26.67  LATICACIDVEN 1.8    Recent Results (from the past 240 hour(s))  Culture, blood (Routine X 2) w Reflex to ID Panel     Status: Abnormal   Collection Time: 01/02/18  8:35 AM  Result Value Ref Range Status   Specimen Description BLOOD SITE NOT SPECIFIED  Final   Special Requests   Final    BOTTLES DRAWN AEROBIC AND ANAEROBIC Blood Culture adequate volume   Culture  Setup Time   Final    GRAM NEGATIVE RODS IN BOTH AEROBIC AND ANAEROBIC BOTTLES CRITICAL RESULT CALLED TO, READ BACK BY AND VERIFIED WITH: G ABOTT PHARMD 01/03/18 1252 JDW    Culture (A)  Final    ESCHERICHIA COLI SUSCEPTIBILITIES PERFORMED ON PREVIOUS CULTURE WITHIN THE LAST 5 DAYS. Performed at [facility], [address], [place], [place] [address]    Report Status 01/05/2018 FINAL  Final  Culture, blood (Routine X 2) w Reflex to ID Panel     Status: Abnormal   Collection Time: 01/02/18  8:36 AM  Result Value Ref Range Status   Specimen Description BLOOD SITE NOT SPECIFIED  Final   Special Requests   Final    BOTTLES DRAWN  AEROBIC AND ANAEROBIC Blood Culture adequate volume   Culture  Setup Time   Final    GRAM NEGATIVE RODS IN BOTH AEROBIC AND ANAEROBIC BOTTLES CRITICAL RESULT CALLED TO, READ BACK BY AND VERIFIED WITH: G ABOTT PHARMD 01/03/18 1252 JDW Performed at [facility], [address], [place], [place] [address]    Culture ESCHERICHIA COLI (A)  Final   Report Status 01/05/2018 FINAL  Final   Organism ID, Bacteria ESCHERICHIA COLI  Final      Susceptibility   Escherichia coli - MIC*    AMPICILLIN <=2 SENSITIVE Sensitive     CEFAZOLIN <=4 SENSITIVE Sensitive     CEFEPIME <=1 SENSITIVE Sensitive     CEFTAZIDIME <=1 SENSITIVE Sensitive     CEFTRIAXONE <=1 SENSITIVE Sensitive     CIPROFLOXACIN <=0.25 SENSITIVE Sensitive     GENTAMICIN <=1 SENSITIVE Sensitive     IMIPENEM <=0.25 SENSITIVE Sensitive     TRIMETH/SULFA <=20 SENSITIVE Sensitive     AMPICILLIN/SULBACTAM <=2 SENSITIVE Sensitive     PIP/TAZO <=4 SENSITIVE Sensitive     Extended ESBL NEGATIVE Sensitive     * ESCHERICHIA COLI  Blood Culture ID Panel (Reflexed)     Status: Abnormal   Collection Time: 01/02/18  8:36 AM  Result Value Ref Range Status   Enterococcus species NOT DETECTED NOT DETECTED Final   Listeria monocytogenes NOT DETECTED NOT DETECTED Final   Staphylococcus species NOT DETECTED NOT DETECTED Final   Staphylococcus aureus NOT DETECTED NOT DETECTED Final   Streptococcus species NOT DETECTED NOT DETECTED Final   Streptococcus agalactiae NOT DETECTED NOT DETECTED Final   Streptococcus pneumoniae NOT DETECTED NOT DETECTED Final   Streptococcus pyogenes NOT DETECTED NOT DETECTED Final   Acinetobacter baumannii NOT DETECTED NOT DETECTED Final   Enterobacteriaceae species DETECTED (A) NOT DETECTED Final    Comment: Enterobacteriaceae represent a large family of gram-negative bacteria, not a single organism. CRITICAL RESULT CALLED TO, READ BACK BY AND VERIFIED WITH: G ABOTT PHARMD 01/03/18 1252 JDW    Enterobacter cloacae  complex NOT DETECTED NOT DETECTED Final   Escherichia coli DETECTED (A) NOT DETECTED Final    Comment: CRITICAL RESULT CALLED TO, READ BACK BY AND VERIFIED WITH: G ABOTT PHARMD 01/03/18 1252 JDW    Klebsiella oxytoca NOT DETECTED NOT DETECTED Final   Klebsiella pneumoniae NOT DETECTED NOT DETECTED Final   Proteus species NOT DETECTED NOT DETECTED Final   Serratia marcescens NOT DETECTED NOT DETECTED Final   Carbapenem resistance NOT DETECTED NOT DETECTED Final   Haemophilus influenzae NOT DETECTED NOT DETECTED Final   Neisseria meningitidis NOT DETECTED NOT DETECTED Final   Pseudomonas aeruginosa NOT DETECTED NOT DETECTED Final   Candida albicans NOT DETECTED NOT DETECTED Final   Candida glabrata NOT DETECTED NOT DETECTED Final   Candida krusei NOT DETECTED NOT DETECTED Final   Candida parapsilosis NOT DETECTED NOT DETECTED Final   Candida tropicalis NOT DETECTED NOT DETECTED Final  MRSA PCR Screening     Status: None   Collection Time: 01/03/18  4:59 PM  Result Value Ref Range Status   MRSA by PCR NEGATIVE NEGATIVE Final    Comment:        The GeneXpert MRSA Assay (FDA approved for NASAL specimens only), is one component of a comprehensive MRSA colonization surveillance program. It is not intended to diagnose MRSA infection nor to guide or monitor treatment for MRSA infections. Performed at [facility], [address] [place], [place] [address]       Radiology Studies: Dg Shoulder Left  Result Date: 01/06/2018 CLINICAL DATA:  LEFT shoulder pain question dislocation, history of stroke, cannot feel or move LEFT arm EXAM: LEFT SHOULDER - 2+ VIEW COMPARISON:  Operative images of 09/04/2015 FINDINGS: Diffuse osseous demineralization. AC joint alignment normal. Mild inferior subluxation of LEFT humeral head. Old healed posttraumatic deformity of the surgical neck LEFT humerus with orthopedic hardware. One of the pegs at the LEFT humeral head extends beyond the cortical margin  of the LEFT humeral head likely into the LEFT glenohumeral joint space. No acute fracture, dislocation, or bone destruction. IMPRESSION: Osseous demineralization without acute fracture or dislocation. A PEG of the orthopedic hardware at the LEFT humeral head extends extra osseous at the LEFT humeral head likely into the LEFT shoulder joint space. Electronically Signed   By: [name] M.D.   On: 01/06/2018 20:05      Scheduled Meds: .  stroke: mapping our early stages of recovery book   Does not apply Once  . amLODipine  10 mg Oral Daily  . aspirin EC  81 mg Oral Daily  . calcitRIOL  0.75 mcg Oral Once per day on Tue Thu Sat  . calcium carbonate  1 tablet Oral Daily  . feeding supplement  1 Container Oral TID BM  . insulin aspart  0-9 Units Subcutaneous Q4H  . lanthanum  1,000 mg Oral TID WC  . multivitamin  1 tablet Oral QHS  . pantoprazole  40 mg Oral Daily  . polyethylene glycol  17 g Oral BID  . senna-docusate  1 tablet Oral BID   Continuous Infusions: . cefTRIAXone (ROCEPHIN)  IV Stopped (01/08/18 1409)     LOS: 32 days    Time spent: Total of 25 minutes spent with pt, greater than 50% of which was spent in discussion of  treatment, counseling and coordination of care    Latrelle Dodrill, MD Pager: Text Page via [email or web address]   If 7PM-7AM, please contact night-coverage [email or web address] 01/08/2018, 6:11 PM

## 2018-01-08 NOTE — Progress Notes (Signed)
Hypoglycemic Event  CBG: 66  Treatment: 8 oz grape juice  Symptoms: asymptomatic  Follow-up CBG: Time:2125 CBG Result:86  Possible Reasons for Event: Poor intake  Comments/MD notified: Patient responded well to juice given did not contact provider    Wesley Burns

## 2018-01-09 LAB — GLUCOSE, CAPILLARY
GLUCOSE-CAPILLARY: 103 mg/dL — AB (ref 65–99)
GLUCOSE-CAPILLARY: 81 mg/dL (ref 65–99)
Glucose-Capillary: 94 mg/dL (ref 65–99)
Glucose-Capillary: 117 mg/dL — ABNORMAL HIGH (ref 65–99)
Glucose-Capillary: 168 mg/dL — ABNORMAL HIGH (ref 65–99)
Glucose-Capillary: 201 mg/dL — ABNORMAL HIGH (ref 65–99)

## 2018-01-09 MED ORDER — TRAZODONE HCL 50 MG PO TABS
50.0000 mg | ORAL_TABLET | Freq: Every evening | ORAL | Status: DC | PRN
  Administered 2018-01-09 (×2): 50 mg via ORAL
  Filled 2018-01-09 (×2): qty 1

## 2018-01-09 MED ORDER — ENSURE ENLIVE PO LIQD
237.0000 mL | Freq: Two times a day (BID) | ORAL | Status: DC
  Administered 2018-01-10 (×2): 237 mL via ORAL

## 2018-01-09 MED ORDER — PRO-STAT SUGAR FREE PO LIQD
30.0000 mL | Freq: Two times a day (BID) | ORAL | Status: DC
  Administered 2018-01-09 – 2018-01-10 (×3): 30 mL via ORAL
  Filled 2018-01-09 (×3): qty 30

## 2018-01-09 MED ORDER — ESCITALOPRAM OXALATE 10 MG PO TABS
10.0000 mg | ORAL_TABLET | Freq: Every day | ORAL | Status: DC
  Administered 2018-01-09 – 2018-01-10 (×2): 10 mg via ORAL
  Filled 2018-01-09 (×2): qty 1

## 2018-01-09 NOTE — Progress Notes (Signed)
Physical Therapy Treatment Patient Details Name: Wesley Burns MRN: [id number] DOB: 1966/06/30 Today's Date: 01/09/2018    History of Present Illness 52 y.o. male with ESRD, Type 2 DM, HTN, GERD, and Hx of Right basal ganglia CVA (2017), right transmet amputation. Admitted with right thalamic hemorrhagic stroke. s/p percutaneous chole tube placed 2/4. Also found to be positive for E.coli and enterobacter bacteremia.     PT Comments    Pt somewhat lethargic however agreeable to PT today. Pt continues to make progress towards his goals. Pt almost able to power up and stand on his own if he is able to pull up to [place]. While in standing pt worked on core and quad activation to attain maximal upright posture. D/c plans remain appropriate. PT will continue to follow acutely.    Follow Up Recommendations  Supervision/Assistance - 24 hour;SNF     Equipment Recommendations  None recommended by PT    Recommendations for Other Services       Precautions / Restrictions Precautions Precautions: Fall Precaution Comments: R trans met, L toe amputations Restrictions Weight Bearing Restrictions: No    Mobility  Bed Mobility Overal bed mobility: Needs Assistance Bed Mobility: Sit to Supine;Sidelying to Sit       Sit to supine: Mod assist;HOB elevated;+2 for physical assistance   General bed mobility comments: unable to roll to R due to drain, pt able to reach to bedrail and required x2 assistance to use pad to pivot hips around and to edge of bed  Transfers Overall transfer level: Needs assistance Equipment used: 2 person hand held assist Transfers: Sit to/from Stand Sit to Stand: +2 physical assistance;Min assist         General transfer comment: pt with improved power up and steadying in standing, increased quad and glute contraction to maintain upright, able to perform 3x sit to stand to [facility] and then transferred to recliner    Modified Rankin (Stroke Patients  Only) Modified Rankin (Stroke Patients Only) Pre-Morbid Rankin Score: No symptoms Modified Rankin: Severe disability     Balance   Sitting-balance support: Feet supported;No upper extremity supported Sitting balance-Leahy Scale: Fair Sitting balance - Comments: Pt able to maintain unsupported sitting EOB with min guard assist    Standing balance support: Bilateral upper extremity supported Standing balance-Leahy Scale: Poor Standing balance comment: requires minAx1 to maintain                            Cognition Arousal/Alertness: Awake/alert Behavior During Therapy: WFL for tasks assessed/performed Overall Cognitive Status: Impaired/Different from baseline Area of Impairment: Safety/judgement;Problem solving;Attention                   Current Attention Level: Divided   Following Commands: Follows multi-step commands consistently Safety/Judgement: Decreased awareness of safety   Problem Solving: Slow processing;Requires verbal cues               Pertinent Vitals/Pain Pain Assessment: Faces Faces Pain Scale: Hurts even more Pain Location: right flank, gallbladder drain site Pain Descriptors / Indicators: Sore Pain Intervention(s): Limited activity within patient's tolerance;Monitored during session;Repositioned           PT Goals (current goals can now be found in the care plan section) Acute Rehab PT Goals PT Goal Formulation: With patient Time For Goal Achievement: 01/23/18 Potential to Achieve Goals: Good Progress towards PT goals: Progressing toward goals    Frequency    Min 3X/week  PT Plan Current plan remains appropriate       AM-PAC PT "6 Clicks" Daily Activity  Outcome Measure  Difficulty turning over in bed (including adjusting bedclothes, sheets and blankets)?: Unable Difficulty moving from lying on back to sitting on the side of the bed? : Unable Difficulty sitting down on and standing up from a chair with arms  (e.g., wheelchair, bedside commode, etc,.)?: Unable Help needed moving to and from a bed to chair (including a wheelchair)?: A Lot Help needed walking in hospital room?: Total Help needed climbing 3-5 steps with a railing? : Total 6 Click Score: 7    End of Session Equipment Utilized During Treatment: Gait belt Activity Tolerance: Patient tolerated treatment well Patient left: in chair;with call bell/phone within reach Nurse Communication: Mobility status PT Visit Diagnosis: Unsteadiness on feet (R26.81);Other abnormalities of gait and mobility (R26.89);Muscle weakness (generalized) (M62.81);Difficulty in walking, not elsewhere classified (R26.2);Other symptoms and signs involving the nervous system (R29.898);Hemiplegia and hemiparesis Hemiplegia - Right/Left: Left Hemiplegia - dominant/non-dominant: Non-dominant Hemiplegia - caused by: Other Nontraumatic intracranial hemorrhage     Time: 4270-6237 PT Time Calculation (min) (ACUTE ONLY): 32 min  Charges:  $Therapeutic Activity: 23-37 mins                    G Codes:       Arleigh Odowd B. Migdalia Dk PT, DPT Acute Rehabilitation  [phone] Pager [phone]     [place] 01/09/2018, 1:16 PM

## 2018-01-09 NOTE — Progress Notes (Signed)
Pt informed RN he was feeling depressed and wanted something for depression and anxiety. MD paged and notified and new order received. Will continue to closely monitor. Dionne Bucy RN

## 2018-01-09 NOTE — Progress Notes (Signed)
PROGRESS NOTE [facility]   Wesley Burns   ZOX:[id number] DOB: 01/09/1966  DOA: 12/07/2017 PCP: Darrow Bussing, MD   Brief Narrative:  Wesley Burns  is a51 y.o.y/o malewith history of stroke in 2017, end-stage renal disease, hypertension, diabetes mellitus type 2, hyperlipidemia, presents to the ED with intracranial hemorrhage and left-side weakness on 12/07/2017.  During hospital stay developed acute pancreatitis, subsequently found to have bacteremia due to acute cholecystitis, patient underwent percutaneous cholecystectomy.  Patient been treated for E. coli and Enterobacter bacteremia.  Subjective: Patient seen and examined, report feeling depressed and anxious.  Denies diarrhea.  No acute events overnight.  Assessment & Plan: E. coli bacteremia due to acute cholecystitis Patient treated with multiple antibiotics including meropenem, Unasyn and Rocephin Patient currently on Rocephin, case discussed with ID and continue treatment with oral antibiotics we will continue Rocephin for now and will discharge on Ceftin to complete a total of 14 days. Repeated blood cultures negative so far Remains afebrile  Septic shock due to bacteremia Sepsis physiology resolved See above  ICH Residual left-sided weakness Neurology follow-up SNF for rehab Avoid a/c medication  Acute cholecystitis Was felt the patient was not a candidate for surgical procedure IR was consulted and patient is now status post percutaneous cholecystectomy Draining well Surgery recommending follow-up as an outpatient in 6-8-week for definitive treatment  Hypertension  Blood pressure remains stable Continue current treatment Labetalol as needed  End-stage renal disease HD per nephrologist  Type 2 diabetes mellitus -well controlled A1c 4.0 Continue sliding scale for now  Diarrhea - spontaneously resolved   Depression and anxiety Patient already on trazodone at night we will add Lexapro to  help with anxiety component Follow-up with PCP as an outpatient  DVT prophylaxis: SCDs Code Status: Full code Family Communication: None at bedside Disposition Plan: SNF in on 01/11/18  Consultants:   Neurology  Surgery  IR  Nephrology  Critical care  Procedures:   Percutaneous cystectomy  Antimicrobials: Anti-infectives (From admission, onward)   Start     Dose/Rate Route Frequency Ordered Stop   01/05/18 1445  cefTRIAXone (ROCEPHIN) 2 g in dextrose 5 % 50 mL IVPB     2 g 100 mL/hr over 30 Minutes Intravenous Every 24 hours 01/05/18 1437     01/05/18 1230  ciprofloxacin (CIPRO) IVPB 400 mg     400 mg 200 mL/hr over 60 Minutes Intravenous  Once 01/05/18 1152 01/05/18 1402   01/03/18 1600  meropenem (MERREM) 500 mg in sodium chloride 0.9 % 50 mL IVPB  Status:  Discontinued    Comments:  Pharmacy to adjust the dose   500 mg 100 mL/hr over 30 Minutes Intravenous Every 24 hours 01/03/18 1523 01/05/18 1437   01/03/18 1545  vancomycin (VANCOCIN) 1,500 mg in sodium chloride 0.9 % 500 mL IVPB     1,500 mg 250 mL/hr over 120 Minutes Intravenous  Once 01/03/18 1540 01/03/18 1943   01/03/18 1400  Ampicillin-Sulbactam (UNASYN) 3 g in sodium chloride 0.9 % 100 mL IVPB  Status:  Discontinued     3 g 200 mL/hr over 30 Minutes Intravenous Every 12 hours 01/03/18 1050 01/03/18 1523   01/03/18 1200  metroNIDAZOLE (FLAGYL) IVPB 500 mg  Status:  Discontinued     500 mg 100 mL/hr over 60 Minutes Intravenous Every 8 hours 01/03/18 1049 01/03/18 1526   01/03/18 1100  Ampicillin-Sulbactam (UNASYN) 3 g in sodium chloride 0.9 % 100 mL IVPB  Status:  Discontinued     3  g 200 mL/hr over 30 Minutes Intravenous Every 6 hours 01/03/18 1049 01/03/18 1050   01/03/18 0200  cefTRIAXone (ROCEPHIN) 2 g in dextrose 5 % 50 mL IVPB  Status:  Discontinued     2 g 100 mL/hr over 30 Minutes Intravenous Daily at bedtime 01/03/18 0126 01/03/18 1048          Objective: Vitals:   01/09/18 0521 01/09/18  0903 01/09/18 1407 01/09/18 1700  BP: (!) 173/76 130/77 (!) 142/75 (!) 142/83  Pulse: 83 77 97 80  Resp: 16 16 16 20   Temp: 98.3 F (36.8 C) 98.3 F (36.8 C) 97.7 F (36.5 C) 98.4 F (36.9 C)  TempSrc: Oral Oral Oral Oral  SpO2: 98% 100% 99% 100%  Weight:      Height:        Intake/Output Summary (Last 24 hours) at 01/09/2018 1850 Last data filed at 01/09/2018 0919 Gross per 24 hour  Intake 120 ml  Output 900 ml  Net -780 ml   Filed Weights   01/06/18 1310 01/08/18 0810 01/08/18 1213  Weight: 64.6 kg (142 lb 6.7 oz) 64 kg (141 lb 1.5 oz) 61 kg (134 lb 7.7 oz)    Examination:  General: NAD, looks depressed  Cardiovascular: RRR, S1/S2 +, no rubs, no gallops Respiratory: CTA bilaterally, no wheezing, no rhonchi Abdominal: Soft, NT, ND, bowel sounds +, drain in place clearing out  Extremities: Left-sided hemiparesis   Data Reviewed: I have personally reviewed following labs and imaging studies  CBC: Recent Labs  Lab 01/04/18 0357 01/05/18 0330 01/05/18 1555 01/06/18 0859 01/08/18 0522  WBC 10.0 7.3 8.0 8.1 8.2  NEUTROABS 7.7 4.6  --   --   --   HGB 11.4* 11.1* 12.9* 11.0* 12.4*  HCT 36.1* 35.1* 40.3 34.3* 38.6*  MCV 92.6 91.2 91.2 88.9 89.6  PLT 282 275 291 281 337   Basic Metabolic Panel: Recent Labs  Lab 01/03/18 0547 01/04/18 0357 01/05/18 0330 01/06/18 0549 01/08/18 0522  NA 130* 136 133* 130* 132*  K 4.5 4.9 4.8 5.4* 4.5  CL 91* 100* 97* 93* 93*  CO2 24 23 22  19* 22  GLUCOSE 93 98 79 100* 109*  BUN 46* 26* 39* 48* 29*  CREATININE 6.22* 4.42* 5.85* 7.19* 6.45*  CALCIUM 8.7* 8.9 8.7* 8.7* 8.9  MG  --  1.9  --  2.1  --   PHOS  --   --  2.9 4.3  --    GFR: Estimated Creatinine Clearance: 11.4 mL/min (A) (by C-G formula based on SCr of 6.45 mg/dL (H)). Liver Function Tests: Recent Labs  Lab 01/03/18 1043 01/04/18 0357 01/05/18 0330  AST 79* 52* 68*  ALT 60 48 53  ALKPHOS 547* 497* 663*  BILITOT 3.1* 2.3* 1.7*  PROT 6.7 6.1* 6.0*  ALBUMIN  2.9* 2.7* 2.8*   Recent Labs  Lab 01/03/18 1043 01/04/18 0357 01/05/18 0330  LIPASE 1,663* 956* 658*   No results for input(s): AMMONIA in the last 168 hours. Coagulation Profile: Recent Labs  Lab 01/04/18 0357  INR 1.13   Cardiac Enzymes: No results for input(s): CKTOTAL, CKMB, CKMBINDEX, TROPONINI in the last 168 hours. BNP (last 3 results) No results for input(s): PROBNP in the last 8760 hours. HbA1C: No results for input(s): HGBA1C in the last 72 hours. CBG: Recent Labs  Lab 01/09/18 0038 01/09/18 0423 01/09/18 0740 01/09/18 1210 01/09/18 1640  GLUCAP 103* 94 117* 168* 81   Lipid Profile: No results for input(s): CHOL, HDL, LDLCALC,  TRIG, CHOLHDL, LDLDIRECT in the last 72 hours. Thyroid Function Tests: No results for input(s): TSH, T4TOTAL, FREET4, T3FREE, THYROIDAB in the last 72 hours. Anemia Panel: No results for input(s): VITAMINB12, FOLATE, FERRITIN, TIBC, IRON, RETICCTPCT in the last 72 hours. Sepsis Labs: Recent Labs  Lab 01/03/18 1509  PROCALCITON 26.67  LATICACIDVEN 1.8    Recent Results (from the past 240 hour(s))  Culture, blood (Routine X 2) w Reflex to ID Panel     Status: Abnormal   Collection Time: 01/02/18  8:35 AM  Result Value Ref Range Status   Specimen Description BLOOD SITE NOT SPECIFIED  Final   Special Requests   Final    BOTTLES DRAWN AEROBIC AND ANAEROBIC Blood Culture adequate volume   Culture  Setup Time   Final    GRAM NEGATIVE RODS IN BOTH AEROBIC AND ANAEROBIC BOTTLES CRITICAL RESULT CALLED TO, READ BACK BY AND VERIFIED WITH: G ABOTT PHARMD 01/03/18 1252 JDW    Culture (A)  Final    ESCHERICHIA COLI SUSCEPTIBILITIES PERFORMED ON PREVIOUS CULTURE WITHIN THE LAST 5 DAYS. Performed at [facility], [address], [place], [place] [address]    Report Status 01/05/2018 FINAL  Final  Culture, blood (Routine X 2) w Reflex to ID Panel     Status: Abnormal   Collection Time: 01/02/18  8:36 AM  Result Value Ref Range  Status   Specimen Description BLOOD SITE NOT SPECIFIED  Final   Special Requests   Final    BOTTLES DRAWN AEROBIC AND ANAEROBIC Blood Culture adequate volume   Culture  Setup Time   Final    GRAM NEGATIVE RODS IN BOTH AEROBIC AND ANAEROBIC BOTTLES CRITICAL RESULT CALLED TO, READ BACK BY AND VERIFIED WITH: G ABOTT PHARMD 01/03/18 1252 JDW Performed at [facility], [address] [place], [place] [address]    Culture ESCHERICHIA COLI (A)  Final   Report Status 01/05/2018 FINAL  Final   Organism ID, Bacteria ESCHERICHIA COLI  Final      Susceptibility   Escherichia coli - MIC*    AMPICILLIN <=2 SENSITIVE Sensitive     CEFAZOLIN <=4 SENSITIVE Sensitive     CEFEPIME <=1 SENSITIVE Sensitive     CEFTAZIDIME <=1 SENSITIVE Sensitive     CEFTRIAXONE <=1 SENSITIVE Sensitive     CIPROFLOXACIN <=0.25 SENSITIVE Sensitive     GENTAMICIN <=1 SENSITIVE Sensitive     IMIPENEM <=0.25 SENSITIVE Sensitive     TRIMETH/SULFA <=20 SENSITIVE Sensitive     AMPICILLIN/SULBACTAM <=2 SENSITIVE Sensitive     PIP/TAZO <=4 SENSITIVE Sensitive     Extended ESBL NEGATIVE Sensitive     * ESCHERICHIA COLI  Blood Culture ID Panel (Reflexed)     Status: Abnormal   Collection Time: 01/02/18  8:36 AM  Result Value Ref Range Status   Enterococcus species NOT DETECTED NOT DETECTED Final   Listeria monocytogenes NOT DETECTED NOT DETECTED Final   Staphylococcus species NOT DETECTED NOT DETECTED Final   Staphylococcus aureus NOT DETECTED NOT DETECTED Final   Streptococcus species NOT DETECTED NOT DETECTED Final   Streptococcus agalactiae NOT DETECTED NOT DETECTED Final   Streptococcus pneumoniae NOT DETECTED NOT DETECTED Final   Streptococcus pyogenes NOT DETECTED NOT DETECTED Final   Acinetobacter baumannii NOT DETECTED NOT DETECTED Final   Enterobacteriaceae species DETECTED (A) NOT DETECTED Final    Comment: Enterobacteriaceae represent a large family of gram-negative bacteria, not a single organism. CRITICAL  RESULT CALLED TO, READ BACK BY AND VERIFIED  WITH: G ABOTT PHARMD 01/03/18 1252 JDW    Enterobacter cloacae complex NOT DETECTED NOT DETECTED Final   Escherichia coli DETECTED (A) NOT DETECTED Final    Comment: CRITICAL RESULT CALLED TO, READ BACK BY AND VERIFIED WITH: G ABOTT PHARMD 01/03/18 1252 JDW    Klebsiella oxytoca NOT DETECTED NOT DETECTED Final   Klebsiella pneumoniae NOT DETECTED NOT DETECTED Final   Proteus species NOT DETECTED NOT DETECTED Final   Serratia marcescens NOT DETECTED NOT DETECTED Final   Carbapenem resistance NOT DETECTED NOT DETECTED Final   Haemophilus influenzae NOT DETECTED NOT DETECTED Final   Neisseria meningitidis NOT DETECTED NOT DETECTED Final   Pseudomonas aeruginosa NOT DETECTED NOT DETECTED Final   Candida albicans NOT DETECTED NOT DETECTED Final   Candida glabrata NOT DETECTED NOT DETECTED Final   Candida krusei NOT DETECTED NOT DETECTED Final   Candida parapsilosis NOT DETECTED NOT DETECTED Final   Candida tropicalis NOT DETECTED NOT DETECTED Final  MRSA PCR Screening     Status: None   Collection Time: 01/03/18  4:59 PM  Result Value Ref Range Status   MRSA by PCR NEGATIVE NEGATIVE Final    Comment:        The GeneXpert MRSA Assay (FDA approved for NASAL specimens only), is one component of a comprehensive MRSA colonization surveillance program. It is not intended to diagnose MRSA infection nor to guide or monitor treatment for MRSA infections. Performed at [facility], [address], [place], [place] [address]   Culture, blood (Routine X 2) w Reflex to ID Panel     Status: None (Preliminary result)   Collection Time: 01/08/18  8:52 PM  Result Value Ref Range Status   Specimen Description BLOOD RIGHT ANTECUBITAL  Final   Special Requests   Final    BOTTLES DRAWN AEROBIC AND ANAEROBIC Blood Culture adequate volume   Culture   Final    NO GROWTH < 24 HOURS Performed at [facility], [address] [place], [place]  [address]    Report Status PENDING  Incomplete  Culture, blood (Routine X 2) w Reflex to ID Panel     Status: None (Preliminary result)   Collection Time: 01/08/18  8:58 PM  Result Value Ref Range Status   Specimen Description BLOOD RIGHT HAND  Final   Special Requests   Final    BOTTLES DRAWN AEROBIC ONLY Blood Culture adequate volume   Culture   Final    NO GROWTH < 24 HOURS Performed at [facility], [address] [place], [place] [address]    Report Status PENDING  Incomplete      Radiology Studies: No results found.    Scheduled Meds: .  stroke: mapping our early stages of recovery book   Does not apply Once  . amLODipine  10 mg Oral Daily  . aspirin EC  81 mg Oral Daily  . calcitRIOL  0.75 mcg Oral Once per day on Tue Thu Sat  . calcium carbonate  1 tablet Oral Daily  . [START ON 01/10/2018] feeding supplement (ENSURE ENLIVE)  237 mL Oral BID BM  . feeding supplement (PRO-STAT SUGAR FREE 64)  30 mL Oral BID  . insulin aspart  0-9 Units Subcutaneous Q4H  . lanthanum  1,000 mg Oral TID WC  . multivitamin  1 tablet Oral QHS  . pantoprazole  40 mg Oral Daily  . polyethylene glycol  17 g Oral BID  . senna-docusate  1 tablet Oral  BID   Continuous Infusions: . cefTRIAXone (ROCEPHIN)  IV Stopped (01/09/18 1614)     LOS: 33 days    Time spent: Total of 15 minutes spent with pt, greater than 50% of which was spent in discussion of  treatment, counseling and coordination of care   Latrelle Dodrill, MD Pager: Text Page via [email or web address]   If 7PM-7AM, please contact night-coverage [email or web address] 01/09/2018, 6:50 PM

## 2018-01-09 NOTE — Progress Notes (Signed)
[facility] Progress Note  Subjective: no new c/o's  Vitals:   01/08/18 2126 01/09/18 0133 01/09/18 0521 01/09/18 0903  BP: (!) 160/87 (!) 159/79 (!) 173/76 130/77  Pulse: 82 76 83 77  Resp: 16 16 16 16   Temp: 98.9 F (37.2 C) 99.3 F (37.4 C) 98.3 F (36.8 C) 98.3 F (36.8 C)  TempSrc: Oral Oral Oral Oral  SpO2: 99% 99% 98% 100%  Weight:      Height:        Inpatient medications: .  stroke: mapping our early stages of recovery book   Does not apply Once  . amLODipine  10 mg Oral Daily  . aspirin EC  81 mg Oral Daily  . calcitRIOL  0.75 mcg Oral Once per day on Tue Thu Sat  . calcium carbonate  1 tablet Oral Daily  . feeding supplement  1 Container Oral TID BM  . insulin aspart  0-9 Units Subcutaneous Q4H  . lanthanum  1,000 mg Oral TID WC  . multivitamin  1 tablet Oral QHS  . pantoprazole  40 mg Oral Daily  . polyethylene glycol  17 g Oral BID  . senna-docusate  1 tablet Oral BID   . cefTRIAXone (ROCEPHIN)  IV Stopped (01/08/18 1409)   acetaminophen **OR** acetaminophen (TYLENOL) oral liquid 160 mg/5 mL **OR** acetaminophen, benzonatate, camphor-menthol, famotidine, labetalol, ondansetron (ZOFRAN) IV **OR** [DISCONTINUED] promethazine, oxyCODONE-acetaminophen, RESOURCE THICKENUP CLEAR, tiZANidine, traZODone  Exam: Alert, no distress, L hemi Chest cta bilat RRR no mrg Abd soft tender RUQ Ext no edema L AVF     Dialysis: TTS [facility] 4h    65kg (lower at dc ~58kg)   2/2 bath  L AVF   No heparin x 8 wks = resume 02/04/18, after CVA/ bleed) - Mircera 04/06/18 IV q 2 weeks (last 1/3) - Calcitriol 0.83mcg PO q HD        Impression: 1  R CVA/ bleed 2  Acute cholecystitis sp chole tube 3  ESRD HD tts. No hep x 8 wks after CVA, will resume 02/04/18 4  Anemia ckd, Hb stable 5  MBD ckd, cont calcitriol, binder 6  HTN BP's up 7  DM2 8  Vol excess - recur issue, would like to get down into 58kg range  Plan - HD Sat   Thu MD [facility] pager [phone]   01/09/2018, 1:58 PM   Recent Labs  Lab 01/05/18 0330 01/06/18 0549 01/08/18 0522  NA 133* 130* 132*  K 4.8 5.4* 4.5  CL 97* 93* 93*  CO2 22 19* 22  GLUCOSE 79 100* 109*  BUN 39* 48* 29*  CREATININE 5.85* 7.19* 6.45*  CALCIUM 8.7* 8.7* 8.9  PHOS 2.9 4.3  --    Recent Labs  Lab 01/03/18 1043 01/04/18 0357 01/05/18 0330  AST 79* 52* 68*  ALT 60 48 53  ALKPHOS 547* 497* 663*  BILITOT 3.1* 2.3* 1.7*  PROT 6.7 6.1* 6.0*  ALBUMIN 2.9* 2.7* 2.8*   Recent Labs  Lab 01/04/18 0357 01/05/18 0330 01/05/18 1555 01/06/18 0859 01/08/18 0522  WBC 10.0 7.3 8.0 8.1 8.2  NEUTROABS 7.7 4.6  --   --   --   HGB 11.4* 11.1* 12.9* 11.0* 12.4*  HCT 36.1* 35.1* 40.3 34.3* 38.6*  MCV 92.6 91.2 91.2 88.9 89.6  PLT 282 275 291 281 337   Iron/TIBC/Ferritin/ %Sat    Component Value Date/Time   IRON 37 (L) 12/15/2017 1216   TIBC 239 (L) 12/15/2017 1216  FERRITIN 629 (H) 12/15/2017 1216   IRONPCTSAT 15 (L) 12/15/2017 1216

## 2018-01-09 NOTE — Progress Notes (Signed)
CSW following for discharge plan. CSW confirmed with Amy at [facility] that patient can begin his initial outpatient dialysis session on Tuesday, 01/13/18 at 9:45 am (needs to arrive at 9:15 am). CSW confirmed with Bjorn Loser at [facility] that the patient can admit on Sunday.   CSW alerted MD. Patient will discharge on Sunday to [facility].  PTAR has been scheduled for 8:30 am on Sunday, 01/11/18. MD, RN, patient, and patient's partner, Michele Mcalpine, made aware.   CSW to fax DC summary to [phone].  Nurse to call report to [phone].  CSW will follow to coordinate discharge on Sunday morning.  Blenda Nicely, [place] Clinical Social Worker [phone]

## 2018-01-09 NOTE — Progress Notes (Signed)
Nutrition Follow-up  DOCUMENTATION CODES:   Severe malnutrition in context of chronic illness  INTERVENTION:   Discontinue Boost Breeze  Recommend d/c scheduled Miralax & Senekot d/t diarrhea  Ensure Enlive po BID, each supplement provides 350 kcal and 20 grams of protein  Prostat po BID, each supplement provides 100 kcal and 15 grams of protein  NUTRITION DIAGNOSIS:   Severe Malnutrition related to chronic illness(ESRD, HD) as evidenced by percent weight loss, severe fat depletion, severe muscle depletion.  Ongoing  GOAL:   Patient will meet greater than or equal to 90% of their needs  Unmet  MONITOR:   PO intake, Supplement acceptance, Labs, Weight trends, Skin   ASSESSMENT:   Pt with PMH of ESRD on HD TTS, DM, HTN, diabetic foot ulcers admitted 1/6 with cerebellar ICH.  Pt has an appetite and has been eating since he was advanced from CLD to Renal diet; however, is not tolerating food very well. Pt has N/V, describes his GI tract as feeling uncomfortable, "making a lot of sounds", and c/o diarrhea.   Pt became nauseous during RD interview and vomited. Pt continued to vomit more after nutrition assessment. RN said he would administer anti-nausea medication for pt. Per report, pt has had other episodes of vomiting. Recorded intake since last RD encounter averaged 25-75% meal.  Pt reported that he drank some Boost Breeze but it is too sweet for him. He said that Nepro and dairy products also causes N/V but he has tolerated Ensure previously. Pt was encouraged to drink Ensure especially if he continues to have difficulty tolerating food.    Mobility/function:  Pt denies any difficulties with self-feeding, chewing, or swallowing.  Pt reports good mobility using cane at home. Foot amputations well healed. Pt denies having mobility issues related to prior foot amputations using a cane at home and can drive a car himself. Lives with partner.   Noted estimated dry wt of  65kg as recent as 1 month ago. Current weight of 61kg with nephrology estimating new dry wt of 58kg (10.8% wt loss in 1 month).   Medications:  Calcitriiol Tums RENA-VIT Protonix Miralax cefTRIAXone Senekot  Labs:  CBG's 103, 94, 117, 168 Sodium- 132 (L)   NUTRITION - FOCUSED PHYSICAL EXAM:    Most Recent Value  Orbital Region  Moderate depletion  Upper Arm Region  Severe depletion  Thoracic and Lumbar Region  Moderate depletion  Buccal Region  Moderate depletion  Temple Region  Severe depletion  Clavicle Bone Region  Severe depletion  Clavicle and Acromion Bone Region  Moderate depletion  Scapular Bone Region  Moderate depletion  Dorsal Hand  Severe depletion  Patellar Region  Severe depletion  Anterior Thigh Region  Severe depletion  Posterior Calf Region  Severe depletion  Edema (RD Assessment)  None  Hair  Reviewed  Eyes  Reviewed  Mouth  Unable to assess  Skin  Reviewed  Nails  Reviewed       Diet Order:  Fall precautions Diet renal with fluid restriction Fluid restriction: 1200 mL Fluid; Room service appropriate? Yes; Fluid consistency: Thin  EDUCATION NEEDS:   Education needs have been addressed  Skin:  Skin Assessment: Skin Integrity Issues: Skin Integrity Issues:: Stage II(Partial Rt foot amputation) Stage II: to ball of left foot; non-healing as of 2/4  Last BM:  2/4 Type 7, large    Height:   Ht Readings from Last 1 Encounters:  01/03/18 5\' 11"  (1.803 m)    Weight:   Wt  Readings from Last 1 Encounters:  01/08/18 134 lb 7.7 oz (61 kg)    Ideal Body Weight:  78.18 kg  BMI:  Body mass index is 18.76 kg/m.  Estimated Nutritional Needs:   Kcal:  2,000-2,200 kcal  Protein:  87-100 grams  Fluid:  UOP +1L   Marjie Skiff, MS Dietetic Intern Pager: [phone]

## 2018-01-10 LAB — GLUCOSE, CAPILLARY
GLUCOSE-CAPILLARY: 105 mg/dL — AB (ref 65–99)
GLUCOSE-CAPILLARY: 153 mg/dL — AB (ref 65–99)
GLUCOSE-CAPILLARY: 52 mg/dL — AB (ref 65–99)
Glucose-Capillary: 178 mg/dL — ABNORMAL HIGH (ref 65–99)
Glucose-Capillary: 206 mg/dL — ABNORMAL HIGH (ref 65–99)
Glucose-Capillary: 82 mg/dL (ref 65–99)
Glucose-Capillary: 98 mg/dL (ref 65–99)

## 2018-01-10 LAB — RENAL FUNCTION PANEL
ANION GAP: 20 — AB (ref 5–15)
Albumin: 3.1 g/dL — ABNORMAL LOW (ref 3.5–5.0)
BUN: 26 mg/dL — ABNORMAL HIGH (ref 6–20)
CHLORIDE: 92 mmol/L — AB (ref 101–111)
CO2: 21 mmol/L — AB (ref 22–32)
Calcium: 9.1 mg/dL (ref 8.9–10.3)
Creatinine, Ser: 6.28 mg/dL — ABNORMAL HIGH (ref 0.61–1.24)
GFR calc Af Amer: 11 mL/min — ABNORMAL LOW (ref >60)
GFR calc non Af Amer: 9 mL/min — ABNORMAL LOW (ref >60)
GLUCOSE: 102 mg/dL — AB (ref 65–99)
PHOSPHORUS: 4.4 mg/dL (ref 2.5–4.6)
POTASSIUM: 4.1 mmol/L (ref 3.5–5.1)
Sodium: 133 mmol/L — ABNORMAL LOW (ref 135–145)

## 2018-01-10 MED ORDER — CEFUROXIME AXETIL 500 MG PO TABS
500.0000 mg | ORAL_TABLET | Freq: Every day | ORAL | Status: DC
  Administered 2018-01-10: 500 mg via ORAL
  Filled 2018-01-10: qty 1

## 2018-01-10 MED ORDER — CEFUROXIME AXETIL 500 MG PO TABS: 500.0000 mg | ORAL_TABLET | Freq: Every day | ORAL | 0 refills | Status: AC

## 2018-01-10 MED ORDER — CALCIUM CARBONATE ANTACID 500 MG PO CHEW: 1.0000 | CHEWABLE_TABLET | Freq: Every day | ORAL | Status: DC

## 2018-01-10 MED ORDER — TIZANIDINE HCL 2 MG PO TABS: 2.0000 mg | ORAL_TABLET | Freq: Three times a day (TID) | ORAL | 0 refills | Status: AC | PRN

## 2018-01-10 MED ORDER — OXYCODONE-ACETAMINOPHEN 5-325 MG PO TABS: 1.0000 | ORAL_TABLET | Freq: Four times a day (QID) | ORAL | 0 refills | Status: DC | PRN

## 2018-01-10 MED ORDER — POLYETHYLENE GLYCOL 3350 17 G PO PACK: 17.0000 g | PACK | Freq: Two times a day (BID) | ORAL | 0 refills | Status: AC

## 2018-01-10 MED ORDER — RENA-VITE PO TABS: 1.0000 | ORAL_TABLET | Freq: Every day | ORAL | 0 refills | Status: AC

## 2018-01-10 MED ORDER — RESOURCE THICKENUP CLEAR PO POWD: ORAL | Status: DC

## 2018-01-10 MED ORDER — TRAZODONE HCL 50 MG PO TABS: 50.0000 mg | ORAL_TABLET | Freq: Every evening | ORAL | Status: AC | PRN

## 2018-01-10 MED ORDER — ESCITALOPRAM OXALATE 10 MG PO TABS: 10.0000 mg | ORAL_TABLET | Freq: Every day | ORAL | Status: DC

## 2018-01-10 MED ORDER — CAMPHOR-MENTHOL 0.5-0.5 % EX LOTN: TOPICAL_LOTION | CUTANEOUS | 0 refills | Status: DC | PRN

## 2018-01-10 MED ORDER — BENZONATATE 100 MG PO CAPS: 100.0000 mg | ORAL_CAPSULE | Freq: Three times a day (TID) | ORAL | 0 refills | Status: AC | PRN

## 2018-01-10 MED ORDER — ASPIRIN 81 MG PO TBEC: 81.0000 mg | DELAYED_RELEASE_TABLET | Freq: Every day | ORAL | Status: DC

## 2018-01-10 MED ORDER — CALCITRIOL 0.25 MCG PO CAPS: 0.7500 ug | ORAL_CAPSULE | ORAL | Status: AC

## 2018-01-10 MED ORDER — LANTHANUM CARBONATE 1000 MG PO CHEW: 1000.0000 mg | CHEWABLE_TABLET | Freq: Three times a day (TID) | ORAL | Status: DC

## 2018-01-10 NOTE — Progress Notes (Signed)
Dialysis treatment completed.  2100 mL ultrafiltrated and net fluid removal 1000 mL.    Patient status unchanged. Lung sounds diminished to ausculation in all fields. No edema. Cardiac: NSR.  Disconnected lines and removed needles.  Pressure held for 10 minutes and band aid/gauze dressing applied.  Report given to bedside RN, Gershon Cull.

## 2018-01-10 NOTE — Progress Notes (Signed)
Pt back to room from dialysis. Pt alert and verbally responsive. Dionne Bucy RN

## 2018-01-10 NOTE — Progress Notes (Signed)
[facility] Progress Note  Subjective: no new c/o's  Vitals:   01/09/18 2135 01/10/18 0141 01/10/18 0539 01/10/18 0928  BP: (!) 151/83 (!) 143/76 (!) 150/86 (!) 157/82  Pulse: 95 87 92 87  Resp: 20 20 20 18   Temp: 98.3 F (36.8 C) 98 F (36.7 C) 98.7 F (37.1 C) (!) 97.5 F (36.4 C)  TempSrc: Oral Oral Axillary Oral  SpO2: 99% 98% 99% 100%  Weight:      Height:        Inpatient medications: .  stroke: mapping our early stages of recovery book   Does not apply Once  . amLODipine  10 mg Oral Daily  . aspirin EC  81 mg Oral Daily  . calcitRIOL  0.75 mcg Oral Once per day on Tue Thu Sat  . calcium carbonate  1 tablet Oral Daily  . escitalopram  10 mg Oral Daily  . feeding supplement (ENSURE ENLIVE)  237 mL Oral BID BM  . feeding supplement (PRO-STAT SUGAR FREE 64)  30 mL Oral BID  . insulin aspart  0-9 Units Subcutaneous Q4H  . lanthanum  1,000 mg Oral TID WC  . multivitamin  1 tablet Oral QHS  . pantoprazole  40 mg Oral Daily  . polyethylene glycol  17 g Oral BID  . senna-docusate  1 tablet Oral BID   . cefTRIAXone (ROCEPHIN)  IV Stopped (01/09/18 1614)   acetaminophen **OR** acetaminophen (TYLENOL) oral liquid 160 mg/5 mL **OR** acetaminophen, benzonatate, camphor-menthol, famotidine, labetalol, ondansetron (ZOFRAN) IV **OR** [DISCONTINUED] promethazine, oxyCODONE-acetaminophen, RESOURCE THICKENUP CLEAR, tiZANidine, traZODone  Exam: Alert, no distress, L hemi Chest cta bilat RRR no mrg Abd soft tender RUQ Ext no edema L AVF     Dialysis: TTS [facility] 4h    65kg (lower at dc ~58kg)   2/2 bath  L AVF   No heparin x 8 wks = resume 02/04/18, after CVA/ bleed) - Mircera 04/06/18 IV q 2 weeks (last 1/3) - Calcitriol 0.13mcg PO q HD        Impression: 1  R CVA/ bleed 2  Acute cholecystitis sp chole tube, afeb on abx 3  ESRD HD tts. No hep x 8 wks after hemorrhagic CVA, can resume 02/04/18 4  Anemia ckd, Hb stable 5  MBD ckd, cont calcitriol, binder 6  HTN  BP's up 7  DM2 8  Vol excess - recur issue, would like to get down into 58kg range  Plan - HD Sat, lower vol as Mon MD Caryl Pina pager [phone]   01/10/2018, 10:23 AM   Recent Labs  Lab 01/05/18 0330 01/06/18 0549 01/08/18 0522 01/10/18 0237  NA 133* 130* 132* 133*  K 4.8 5.4* 4.5 4.1  CL 97* 93* 93* 92*  CO2 22 19* 22 21*  GLUCOSE 79 100* 109* 102*  BUN 39* 48* 29* 26*  CREATININE 5.85* 7.19* 6.45* 6.28*  CALCIUM 8.7* 8.7* 8.9 9.1  PHOS 2.9 4.3  --  4.4   Recent Labs  Lab 01/03/18 1043 01/04/18 0357 01/05/18 0330 01/10/18 0237  AST 79* 52* 68*  --   ALT 60 48 53  --   ALKPHOS 547* 497* 663*  --   BILITOT 3.1* 2.3* 1.7*  --   PROT 6.7 6.1* 6.0*  --   ALBUMIN 2.9* 2.7* 2.8* 3.1*   Recent Labs  Lab 01/04/18 0357 01/05/18 0330 01/05/18 1555 01/06/18 0859 01/08/18 0522  WBC 10.0 7.3 8.0 8.1 8.2  NEUTROABS  7.7 4.6  --   --   --   HGB 11.4* 11.1* 12.9* 11.0* 12.4*  HCT 36.1* 35.1* 40.3 34.3* 38.6*  MCV 92.6 91.2 91.2 88.9 89.6  PLT 282 275 291 281 337   Iron/TIBC/Ferritin/ %Sat    Component Value Date/Time   IRON 37 (L) 12/15/2017 1216   TIBC 239 (L) 12/15/2017 1216   FERRITIN 629 (H) 12/15/2017 1216   IRONPCTSAT 15 (L) 12/15/2017 1216

## 2018-01-10 NOTE — Progress Notes (Signed)
PROGRESS NOTE [facility]   GIBBS NAUGLE   OIZ:[id number] DOB: [birth year]  DOA: 12/07/2017 PCP: Darrow Bussing, MD   Brief Narrative:  Wesley Burns  is a51 y.o.y/o malewith history of stroke in 2017, end-stage renal disease, hypertension, diabetes mellitus type 2, hyperlipidemia, presents to the ED with facial droop and left-side weakness on 12/07/2017.  Patient admitted after CT showed right thalamic ICH with IVH, left thalamic stroke.  CTA head showed right PCA stenosis but no AVM or aneurysm.  Repeat CT in stable.  Residual left hemiplegia, repeat CT head on 12/27/2017 showed right thalamic ICH reabsorb.  Patient started on ASA 81 mg.  Patient blood pressure was controlled with Norvasc, clonidine and hydralazine. During hospital stay on 12/2917 developed abdominal pain, subsequently developed fever and was diagnosed with acute pancreatitis and E.Coli bacteremia, patient was placed on n.p.o. on 2/2 patient developed hypotension and lethargy, found to be in septic shock was transferred to the ICU treated with IV fluids and broad-spectrum antibiotics. CT of the abdomen shows possible acute cholecystitis with HIDA scan confirming diagnosis. Surgical team was consulted but the patient was not candidate for surgery at this time and recommended percutaneous cholecystectomy.  IR team performed percutaneous cholecystectomy on 2/4.  BP improved with IV fluids and patient returned back to baseline. PT evaluation recommended SNF for rehab.   Subjective: Patient seen and examined, no new complaints.   Assessment & Plan: E. coli bacteremia due to acute cholecystitis/pancreatitis  Patient treated with multiple antibiotics including meropenem, Unasyn and Rocephin Patient currently on Rocephin, case discussed with ID and will continue treatment with oral antibiotics will switch toCeftin to complete a total of 14 days of abx tx  Repeated blood cultures negative so far  Septic shock due to  bacteremia Sepsis physiology resolved See above  ICH Residual left-sided weakness SNF for rehab Avoid a/c medication  Acute cholecystitis Surgery felt that patient was not a candidate for surgical procedure IR was consulted and patient is now status post percutaneous cholecystectomy Draining well Surgery recommending follow-up as an outpatient in 6-8-week for definitive treatment  Hypertension  Blood pressure remains stable Continue current treatment Labetalol as needed  End-stage renal disease HD per nephrologist  Type 2 diabetes mellitus -well controlled A1c 4.0 Continue sliding scale for now  Diarrhea - spontaneously resolved   Depression and anxiety Patient already on trazodone at night we will add Lexapro to help with anxiety component Follow-up with PCP as an outpatient  DVT prophylaxis: SCDs Code Status: Full code Family Communication: None at bedside Disposition Plan: SNF in on 01/11/18  Consultants:   Neurology  Surgery  IR  Nephrology  Critical care  Procedures:   Percutaneous cystectomy  Antimicrobials: Anti-infectives (From admission, onward)   Start     Dose/Rate Route Frequency Ordered Stop   01/12/18 0000  cefUROXime (CEFTIN) 500 MG tablet     500 mg Oral Daily at bedtime 01/10/18 1236 01/20/18 2359   01/10/18 2200  cefUROXime (CEFTIN) tablet 500 mg     500 mg Oral Daily at bedtime 01/10/18 1234     01/05/18 1445  cefTRIAXone (ROCEPHIN) 2 g in dextrose 5 % 50 mL IVPB  Status:  Discontinued     2 g 100 mL/hr over 30 Minutes Intravenous Every 24 hours 01/05/18 1437 01/10/18 1234   01/05/18 1230  ciprofloxacin (CIPRO) IVPB 400 mg     400 mg 200 mL/hr over 60 Minutes Intravenous  Once 01/05/18 1152 01/05/18 1402   01/03/18  1600  meropenem (MERREM) 500 mg in sodium chloride 0.9 % 50 mL IVPB  Status:  Discontinued    Comments:  Pharmacy to adjust the dose   500 mg 100 mL/hr over 30 Minutes Intravenous Every 24 hours 01/03/18 1523 01/05/18  1437   01/03/18 1545  vancomycin (VANCOCIN) 1,500 mg in sodium chloride 0.9 % 500 mL IVPB     1,500 mg 250 mL/hr over 120 Minutes Intravenous  Once 01/03/18 1540 01/03/18 1943   01/03/18 1400  Ampicillin-Sulbactam (UNASYN) 3 g in sodium chloride 0.9 % 100 mL IVPB  Status:  Discontinued     3 g 200 mL/hr over 30 Minutes Intravenous Every 12 hours 01/03/18 1050 01/03/18 1523   01/03/18 1200  metroNIDAZOLE (FLAGYL) IVPB 500 mg  Status:  Discontinued     500 mg 100 mL/hr over 60 Minutes Intravenous Every 8 hours 01/03/18 1049 01/03/18 1526   01/03/18 1100  Ampicillin-Sulbactam (UNASYN) 3 g in sodium chloride 0.9 % 100 mL IVPB  Status:  Discontinued     3 g 200 mL/hr over 30 Minutes Intravenous Every 6 hours 01/03/18 1049 01/03/18 1050   01/03/18 0200  cefTRIAXone (ROCEPHIN) 2 g in dextrose 5 % 50 mL IVPB  Status:  Discontinued     2 g 100 mL/hr over 30 Minutes Intravenous Daily at bedtime 01/03/18 0126 01/03/18 1048          Objective: Vitals:   01/09/18 2135 01/10/18 0141 01/10/18 0539 01/10/18 0928  BP: (!) 151/83 (!) 143/76 (!) 150/86 (!) 157/82  Pulse: 95 87 92 87  Resp: 20 20 20 18   Temp: 98.3 F (36.8 C) 98 F (36.7 C) 98.7 F (37.1 C) (!) 97.5 F (36.4 C)  TempSrc: Oral Oral Axillary Oral  SpO2: 99% 98% 99% 100%  Weight:      Height:        Intake/Output Summary (Last 24 hours) at 01/10/2018 1241 Last data filed at 01/10/2018 0600 Gross per 24 hour  Intake 200 ml  Output 85 ml  Net 115 ml   Filed Weights   01/06/18 1310 01/08/18 0810 01/08/18 1213  Weight: 64.6 kg (142 lb 6.7 oz) 64 kg (141 lb 1.5 oz) 61 kg (134 lb 7.7 oz)    Examination:  General: Tearful,  Cardiovascular: RRR S1S2 Respiratory: CTA Abdominal: Abd soft NTND, draining in place Extremities: Left side hemiparesis    Data Reviewed: I have personally reviewed following labs and imaging studies  CBC: Recent Labs  Lab 01/04/18 0357 01/05/18 0330 01/05/18 1555 01/06/18 0859 01/08/18 0522    WBC 10.0 7.3 8.0 8.1 8.2  NEUTROABS 7.7 4.6  --   --   --   HGB 11.4* 11.1* 12.9* 11.0* 12.4*  HCT 36.1* 35.1* 40.3 34.3* 38.6*  MCV 92.6 91.2 91.2 88.9 89.6  PLT 282 275 291 281 337   Basic Metabolic Panel: Recent Labs  Lab 01/04/18 0357 01/05/18 0330 01/06/18 0549 01/08/18 0522 01/10/18 0237  NA 136 133* 130* 132* 133*  K 4.9 4.8 5.4* 4.5 4.1  CL 100* 97* 93* 93* 92*  CO2 23 22 19* 22 21*  GLUCOSE 98 79 100* 109* 102*  BUN 26* 39* 48* 29* 26*  CREATININE 4.42* 5.85* 7.19* 6.45* 6.28*  CALCIUM 8.9 8.7* 8.7* 8.9 9.1  MG 1.9  --  2.1  --   --   PHOS  --  2.9 4.3  --  4.4   GFR: Estimated Creatinine Clearance: 11.7 mL/min (A) (by C-G formula based  on SCr of 6.28 mg/dL (H)). Liver Function Tests: Recent Labs  Lab 01/04/18 0357 01/05/18 0330 01/10/18 0237  AST 52* 68*  --   ALT 48 53  --   ALKPHOS 497* 663*  --   BILITOT 2.3* 1.7*  --   PROT 6.1* 6.0*  --   ALBUMIN 2.7* 2.8* 3.1*   Recent Labs  Lab 01/04/18 0357 01/05/18 0330  LIPASE 956* 658*   No results for input(s): AMMONIA in the last 168 hours. Coagulation Profile: Recent Labs  Lab 01/04/18 0357  INR 1.13   Cardiac Enzymes: No results for input(s): CKTOTAL, CKMB, CKMBINDEX, TROPONINI in the last 168 hours. BNP (last 3 results) No results for input(s): PROBNP in the last 8760 hours. HbA1C: No results for input(s): HGBA1C in the last 72 hours. CBG: Recent Labs  Lab 01/09/18 2009 01/10/18 0014 01/10/18 0418 01/10/18 0732 01/10/18 1145  GLUCAP 201* 82 98 105* 153*   Lipid Profile: No results for input(s): CHOL, HDL, LDLCALC, TRIG, CHOLHDL, LDLDIRECT in the last 72 hours. Thyroid Function Tests: No results for input(s): TSH, T4TOTAL, FREET4, T3FREE, THYROIDAB in the last 72 hours. Anemia Panel: No results for input(s): VITAMINB12, FOLATE, FERRITIN, TIBC, IRON, RETICCTPCT in the last 72 hours. Sepsis Labs: Recent Labs  Lab 01/03/18 1509  PROCALCITON 26.67  LATICACIDVEN 1.8    Recent  Results (from the past 240 hour(s))  Culture, blood (Routine X 2) w Reflex to ID Panel     Status: Abnormal   Collection Time: 01/02/18  8:35 AM  Result Value Ref Range Status   Specimen Description BLOOD SITE NOT SPECIFIED  Final   Special Requests   Final    BOTTLES DRAWN AEROBIC AND ANAEROBIC Blood Culture adequate volume   Culture  Setup Time   Final    GRAM NEGATIVE RODS IN BOTH AEROBIC AND ANAEROBIC BOTTLES CRITICAL RESULT CALLED TO, READ BACK BY AND VERIFIED WITH: G ABOTT PHARMD 01/03/18 1252 JDW    Culture (A)  Final    ESCHERICHIA COLI SUSCEPTIBILITIES PERFORMED ON PREVIOUS CULTURE WITHIN THE LAST 5 DAYS. Performed at [facility], [address], [place], [place] [address]    Report Status 01/05/2018 FINAL  Final  Culture, blood (Routine X 2) w Reflex to ID Panel     Status: Abnormal   Collection Time: 01/02/18  8:36 AM  Result Value Ref Range Status   Specimen Description BLOOD SITE NOT SPECIFIED  Final   Special Requests   Final    BOTTLES DRAWN AEROBIC AND ANAEROBIC Blood Culture adequate volume   Culture  Setup Time   Final    GRAM NEGATIVE RODS IN BOTH AEROBIC AND ANAEROBIC BOTTLES CRITICAL RESULT CALLED TO, READ BACK BY AND VERIFIED WITH: G ABOTT PHARMD 01/03/18 1252 JDW Performed at [facility], [address], [place], [place] [address]    Culture ESCHERICHIA COLI (A)  Final   Report Status 01/05/2018 FINAL  Final   Organism ID, Bacteria ESCHERICHIA COLI  Final      Susceptibility   Escherichia coli - MIC*    AMPICILLIN <=2 SENSITIVE Sensitive     CEFAZOLIN <=4 SENSITIVE Sensitive     CEFEPIME <=1 SENSITIVE Sensitive     CEFTAZIDIME <=1 SENSITIVE Sensitive     CEFTRIAXONE <=1 SENSITIVE Sensitive     CIPROFLOXACIN <=0.25 SENSITIVE Sensitive     GENTAMICIN <=1 SENSITIVE Sensitive     IMIPENEM <=0.25 SENSITIVE Sensitive     TRIMETH/SULFA <=20 SENSITIVE Sensitive  AMPICILLIN/SULBACTAM <=2 SENSITIVE Sensitive     PIP/TAZO <=4 SENSITIVE Sensitive      Extended ESBL NEGATIVE Sensitive     * ESCHERICHIA COLI  Blood Culture ID Panel (Reflexed)     Status: Abnormal   Collection Time: 01/02/18  8:36 AM  Result Value Ref Range Status   Enterococcus species NOT DETECTED NOT DETECTED Final   Listeria monocytogenes NOT DETECTED NOT DETECTED Final   Staphylococcus species NOT DETECTED NOT DETECTED Final   Staphylococcus aureus NOT DETECTED NOT DETECTED Final   Streptococcus species NOT DETECTED NOT DETECTED Final   Streptococcus agalactiae NOT DETECTED NOT DETECTED Final   Streptococcus pneumoniae NOT DETECTED NOT DETECTED Final   Streptococcus pyogenes NOT DETECTED NOT DETECTED Final   Acinetobacter baumannii NOT DETECTED NOT DETECTED Final   Enterobacteriaceae species DETECTED (A) NOT DETECTED Final    Comment: Enterobacteriaceae represent a large family of gram-negative bacteria, not a single organism. CRITICAL RESULT CALLED TO, READ BACK BY AND VERIFIED WITH: G ABOTT PHARMD 01/03/18 1252 JDW    Enterobacter cloacae complex NOT DETECTED NOT DETECTED Final   Escherichia coli DETECTED (A) NOT DETECTED Final    Comment: CRITICAL RESULT CALLED TO, READ BACK BY AND VERIFIED WITH: G ABOTT PHARMD 01/03/18 1252 JDW    Klebsiella oxytoca NOT DETECTED NOT DETECTED Final   Klebsiella pneumoniae NOT DETECTED NOT DETECTED Final   Proteus species NOT DETECTED NOT DETECTED Final   Serratia marcescens NOT DETECTED NOT DETECTED Final   Carbapenem resistance NOT DETECTED NOT DETECTED Final   Haemophilus influenzae NOT DETECTED NOT DETECTED Final   Neisseria meningitidis NOT DETECTED NOT DETECTED Final   Pseudomonas aeruginosa NOT DETECTED NOT DETECTED Final   Candida albicans NOT DETECTED NOT DETECTED Final   Candida glabrata NOT DETECTED NOT DETECTED Final   Candida krusei NOT DETECTED NOT DETECTED Final   Candida parapsilosis NOT DETECTED NOT DETECTED Final   Candida tropicalis NOT DETECTED NOT DETECTED Final  MRSA PCR Screening     Status: None    Collection Time: 01/03/18  4:59 PM  Result Value Ref Range Status   MRSA by PCR NEGATIVE NEGATIVE Final    Comment:        The GeneXpert MRSA Assay (FDA approved for NASAL specimens only), is one component of a comprehensive MRSA colonization surveillance program. It is not intended to diagnose MRSA infection nor to guide or monitor treatment for MRSA infections. Performed at [facility], [address] [place], [place] [address]   Culture, blood (Routine X 2) w Reflex to ID Panel     Status: None (Preliminary result)   Collection Time: 01/08/18  8:52 PM  Result Value Ref Range Status   Specimen Description BLOOD RIGHT ANTECUBITAL  Final   Special Requests   Final    BOTTLES DRAWN AEROBIC AND ANAEROBIC Blood Culture adequate volume   Culture   Final    NO GROWTH 2 DAYS Performed at [facility], [address], [place], [place] [address]    Report Status PENDING  Incomplete  Culture, blood (Routine X 2) w Reflex to ID Panel     Status: None (Preliminary result)   Collection Time: 01/08/18  8:58 PM  Result Value Ref Range Status   Specimen Description BLOOD RIGHT HAND  Final   Special Requests   Final    BOTTLES DRAWN AEROBIC ONLY Blood Culture adequate volume   Culture   Final    NO GROWTH 2 DAYS Performed at [facility], [address], [place], [place] [address]    Report Status PENDING  Incomplete      Radiology Studies: No results found.    Scheduled Meds: .  stroke: mapping our early stages of recovery book   Does not apply Once  . amLODipine  10 mg Oral Daily  . aspirin EC  81 mg Oral Daily  . calcitRIOL  0.75 mcg Oral Once per day on Tue Thu Sat  . calcium carbonate  1 tablet Oral Daily  . cefUROXime  500 mg Oral QHS  . escitalopram  10 mg Oral Daily  . feeding supplement (ENSURE ENLIVE)  237 mL Oral BID BM  . feeding supplement (PRO-STAT SUGAR FREE 64)  30 mL Oral BID  . insulin aspart  0-9 Units Subcutaneous Q4H  . lanthanum  1,000  mg Oral TID WC  . multivitamin  1 tablet Oral QHS  . pantoprazole  40 mg Oral Daily  . polyethylene glycol  17 g Oral BID  . senna-docusate  1 tablet Oral BID   Continuous Infusions:    LOS: 34 days    Time spent: Total of 15 minutes spent with pt, greater than 50% of which was spent in discussion of  treatment, counseling and coordination of care   Latrelle Dodrill, MD Pager: Text Page via [email or web address]   If 7PM-7AM, please contact night-coverage [email or web address] 01/10/2018, 12:41 PM

## 2018-01-10 NOTE — Progress Notes (Signed)
Patient arrived to unit per bed.  Reviewed treatment plan and this RN agrees.  Report received from bedside RN, Gershon Cull.  Consent verified.  Patient A & O X 4. Lung sounds diminished and clear to ausculation in all fields. Generalized edema. Cardiac: NSR.  Prepped LLAVF with alcohol and cannulated with two 15 gauge needles.  Pulsation of blood noted.  Flushed access well with saline per protocol.  Connected and secured lines and initiated tx at 1309.  UF goal of 4500 mL and net fluid removal of 4000 mL.  Will continue to monitor.

## 2018-01-10 NOTE — Progress Notes (Signed)
Pt transported off unit to dialysis. Reported off to Dialysis RN. Dionne Bucy RN

## 2018-01-11 DIAGNOSIS — A4151 Sepsis due to Escherichia coli [E. coli]: Secondary | ICD-10-CM

## 2018-01-11 LAB — GLUCOSE, CAPILLARY
GLUCOSE-CAPILLARY: 119 mg/dL — AB (ref 65–99)
Glucose-Capillary: 118 mg/dL — ABNORMAL HIGH (ref 65–99)
Glucose-Capillary: 117 mg/dL — ABNORMAL HIGH (ref 65–99)

## 2018-01-11 NOTE — Clinical Social Work Placement (Addendum)
   CLINICAL SOCIAL WORK PLACEMENT  NOTE  Date:  01/11/2018  Patient Details  Name: Wesley Burns MRN: [id number] Date of Birth: 1966-10-24  Clinical Social Work is seeking post-discharge placement for this patient at the Skilled  Nursing Facility level of care (*CSW will initial, date and re-position this form in  chart as items are completed):      Patient/family provided with [facility] Clinical Social Work Department's list of facilities offering this level of care within the geographic area requested by the patient (or if unable, by the patient's family).  Yes   Patient/family informed of their freedom to choose among providers that offer the needed level of care, that participate in Medicare, Medicaid or managed care program needed by the patient, have an available bed and are willing to accept the patient.      Patient/family informed of [facility]'s ownership interest in [facility] and [facility], as well as of the fact that they are under no obligation to receive care at these facilities.  PASRR submitted to EDS on       PASRR number received on 12/10/17     Existing PASRR number confirmed on       FL2 transmitted to all facilities in geographic area requested by pt/family on 12/10/17     FL2 transmitted to all facilities within larger geographic area on       Patient informed that his/her managed care company has contracts with or will negotiate with certain facilities, including the following:        Yes   Patient/family informed of bed offers received.  Patient chooses bed at [facility]     Physician recommends and patient chooses bed at      Patient to be transferred to [facility] on 01/11/18.  Patient to be transferred to facility by [facility]     Patient family notified on 01/11/18 of transfer.  Name of family member notified:  Phil pt's partner       Additional Comment:     _______________________________________________ Maree Krabbe, LCSW 01/11/2018, 8:22 AM

## 2018-01-11 NOTE — Clinical Social Work Note (Addendum)
DC summary was faxed to [employer] [facility].   Clinical Social Worker facilitated patient discharge including contacting patient family and facility to confirm patient discharge plans.  Clinical information faxed to facility and family agreeable with plan.  CSW prearranged ambulance transport via [facility] (8:30 AM)  to [facility] [facility].  RN to call [phone] for report prior to discharge.  Clinical Social Worker will sign off for now as social work intervention is no longer needed. Please consult [place] again if new need arises.  [place], [place] [phone]

## 2018-01-11 NOTE — Clinical Social Work Note (Addendum)
CSW Armed forces operational officer at [employer]. She knew of pt coming to [facility] today. She is going to call the facility immediately. CSW also called Chiropodist to let him know what happened.  11:18: Director of [employer] called CSW and explained the patient is accepted and inside the facility. Per the director the social worker at the facility had asked EMS to "wait a second for her to get paperwork", there was never mention that the facility would not take the pt. Director assured CSW that pt is in facility and there are no further needs.   [place], [place] [phone]

## 2018-01-11 NOTE — Progress Notes (Signed)
Attempted to call social worker for correct number to facility.

## 2018-01-11 NOTE — Discharge Summary (Signed)
Physician Discharge Summary  Wesley Burns  ZOX:[id number]  DOB: [birth year]  DOA: 12/07/2017 PCP: Darrow Bussing, MD  Admit date: 12/07/2017 Discharge date: 01/11/2018  Admitted From: Home  Disposition:  SNF   Recommendations for Outpatient Follow-up:  1. Follow up with SNF provider at earliest convenience  2. Please obtain BMP/CBC in one week to monitor hgb and Cr  3. Follow up with IR in 6-7 weeks for drain revision  4. Follow up with Surgery in 8 weeks for cholecystectomy evaluation  5. Continue HD on TThS  6. May need CBT for depression   Discharge Condition: Stable   CODE STATUS: Full   Diet recommendation: Heart Healthy / Carb Modified  Brief/Interim Summary: For full details see H&P/Progress note, but in brief, Wesley Burns is [name]  is a52 y.o.y/o malewith history of stroke in 2017, end-stage renal disease, hypertension, diabetes mellitus type 2, hyperlipidemia, presents to the ED with facial droop and left-side weakness on 12/07/2017.  Patient admitted after CT showed right thalamic ICH with IVH, left thalamic stroke.  CTA head showed right PCA stenosis but no AVM or aneurysm.  Repeat CT in stable.  Residual left hemiplegia, repeat CT head on 12/27/2017 showed right thalamic ICH reabsorb.  Patient started on ASA 81 mg.  Patient blood pressure was controlled with Norvasc, clonidine and hydralazine.During hospital stay on 12/2917 developed abdominal pain, subsequently developed fever and was diagnosed with acute pancreatitis and E.Coli bacteremia, patient was placed on n.p.o. on 2/2 patient developed hypotension and lethargy, found to be in septic shock was transferred to the ICU treated with IV fluids and broad-spectrum antibiotics. CT of the abdomen shows possible acute cholecystitis with HIDA scan confirming diagnosis. Surgical team was consulted but the patient was not candidate for surgery at this time and recommended percutaneous cholecystectomy.  IR team  performed percutaneous cholecystectomy on 2/4.  BP improved with IV fluids and patient returned back to baseline. PT evaluation recommended SNF for rehab. During hospital stay patient received his normal dalysis with no issues. Patient deemed stable for discharge.   Subjective: Patient seen and examined, he has no new complaints. Remains afebrile, denies CP, SOB,  palpitations and dizziness.   Discharge Diagnoses/Hospital Course:  E. coli bacteremia due to acute cholecystitis/pancreatitis  Patient treated with multiple antibiotics including meropenem, Unasyn and Rocephin Patient currently on Rocephin, case discussed with ID and will continue treatment with oral antibiotics will switched to Ceftin to complete a total of 14 days of abx tx, 8 more days remaining  Repeated blood cultures negative so far  Septic shock due to bacteremia Sepsis physiology resolved See above  ICH Residual left-sided weakness SNF for rehab Per neurology ok to resume aspirin   Acute cholecystitis Surgery felt that patient was not a candidate for surgical procedure IR was consulted - status post percutaneous cholecystectomy 2/4 Draining well, follow up with IR for revision in 6-7 weeks  Surgery recommending follow-up as an outpatient in 6-8-week for definitive treatment  Hypertension  BP stable upon discharge  Continue amlodipine 10 mg daily   End-stage renal disease HD on T-Th-Sat  Type 2 diabetes mellitus - stable  A1c 4.0 Diet controlled   Diarrhea - spontaneously resolved   Depression and anxiety Patient was on trazodone, was started on Lexapro 10 mg daily, titrate as needed  May need CBT for depression  All other chronic medical condition were stable during the hospitalization.  Patient was seen by physical therapy, recommending SNF for rehab  On the day of the discharge the patient's vitals were stable, and no other acute medical condition were reported by patient. the patient was felt  safe to be discharge to SNF   Discharge Instructions  You were cared for by a hospitalist during your hospital stay. If you have any questions about your discharge medications or the care you received while you were in the hospital after you are discharged, you can call the unit and asked to speak with the hospitalist on call if the hospitalist that took care of you is not available. Once you are discharged, your primary care physician will handle any further medical issues. Please note that NO REFILLS for any discharge medications will be authorized once you are discharged, as it is imperative that you return to your primary care physician (or establish a relationship with a primary care physician if you do not have one) for your aftercare needs so that they can reassess your need for medications and monitor your lab values.  Discharge Instructions    Ambulatory referral to Neurology   Complete by:  As directed    An appointment is requested in approximately: 6 weeks Follow up with stroke clinic  Dr Pearlean Brownie preferred, if not available, then consider Sylvie Farrier, [facility] or Lucia Gaskins whoever is available) at [facility] in about 6-8 weeks. Thanks.   Call MD for:  difficulty breathing, headache or visual disturbances   Complete by:  As directed    Call MD for:  extreme fatigue   Complete by:  As directed    Call MD for:  hives   Complete by:  As directed    Call MD for:  persistant dizziness or light-headedness   Complete by:  As directed    Call MD for:  persistant nausea and vomiting   Complete by:  As directed    Call MD for:  redness, tenderness, or signs of infection (pain, swelling, redness, odor or green/yellow discharge around incision site)   Complete by:  As directed    Call MD for:  severe uncontrolled pain   Complete by:  As directed    Call MD for:  temperature >100.4   Complete by:  As directed    Diet - low sodium heart healthy   Complete by:  As directed    Increase activity slowly    Complete by:  As directed      Allergies as of 01/11/2018      Reactions   Prednisone Hives, Nausea And Vomiting, Swelling   Joint swelling, also   Adhesive [tape] Rash      Medication List    STOP taking these medications   bethanechol 10 MG tablet Commonly known as:  URECHOLINE   cloNIDine 0.1 MG tablet Commonly known as:  CATAPRES   febuxostat 40 MG tablet Commonly known as:  ULORIC   ferrous sulfate 325 (65 FE) MG tablet   FLUoxetine 10 MG capsule Commonly known as:  PROZAC   furosemide 80 MG tablet Commonly known as:  LASIX   magnesium oxide 400 (241.3 Mg) MG tablet Commonly known as:  MAG-OX   sevelamer carbonate 800 MG tablet Commonly known as:  RENVELA   sodium bicarbonate 650 MG tablet   tamsulosin 0.4 MG Caps capsule Commonly known as:  FLOMAX     TAKE these medications   amLODipine 10 MG tablet Commonly known as:  NORVASC Take 1 tablet (10 mg total) by mouth daily.   aspirin 81 MG EC tablet Take 1  tablet (81 mg total) by mouth daily.   benzonatate 100 MG capsule Commonly known as:  TESSALON Take 1 capsule (100 mg total) by mouth 3 (three) times daily as needed for cough.   calcitRIOL 0.25 MCG capsule Commonly known as:  ROCALTROL Take 3 capsules (0.75 mcg total) by mouth 3 (three) times a week. Start taking on:  01/13/2018   calcium carbonate 500 MG chewable tablet Commonly known as:  TUMS - dosed in mg elemental calcium Chew 1 tablet (200 mg of elemental calcium total) by mouth daily.   camphor-menthol lotion Commonly known as:  SARNA Apply topically as needed for itching.   cefUROXime 500 MG tablet Commonly known as:  CEFTIN Take 1 tablet (500 mg total) by mouth at bedtime for 8 days. Start taking on:  01/12/2018   escitalopram 10 MG tablet Commonly known as:  LEXAPRO Take 1 tablet (10 mg total) by mouth daily.   lanthanum 1000 MG chewable tablet Commonly known as:  FOSRENOL Chew 1 tablet (1,000 mg total) by mouth 3 (three)  times daily with meals.   lidocaine-prilocaine cream Commonly known as:  EMLA APPLY TO ACCESS PREDIALYSIS   multivitamin Tabs tablet Take 1 tablet by mouth at bedtime.   oxyCODONE-acetaminophen 5-325 MG tablet Commonly known as:  PERCOCET Take 1 tablet by mouth every 6 (six) hours as needed for severe pain.   pantoprazole 40 MG tablet Commonly known as:  PROTONIX Take 1 tablet (40 mg total) by mouth daily.   polyethylene glycol packet Commonly known as:  MIRALAX / GLYCOLAX Take 17 g by mouth 2 (two) times daily.   RESOURCE THICKENUP CLEAR Powd Use as needed   silver sulfADIAZINE 1 % cream Commonly known as:  SILVADENE Apply 1 application daily topically. What changed:    when to take this  additional instructions   tiZANidine 2 MG tablet Commonly known as:  ZANAFLEX Take 1 tablet (2 mg total) by mouth every 8 (eight) hours as needed for muscle spasms.   traZODone 50 MG tablet Commonly known as:  DESYREL Take 1 tablet (50 mg total) by mouth at bedtime as needed for sleep.       Contact information for follow-up providers    Micki Riley, MD. Schedule an appointment as soon as possible for a visit in 6 week(s).   Specialties:  Neurology, Radiology Contact information: [address] [place] [address] [phone]        Malachy Moan, MD Follow up in 7 week(s).   Specialties:  Interventional Radiology, Radiology Why:  drain can be followed with [facility]; scheduler will call for appt date and time; call [phone] if questions Contact information: [address] [place] [address] [phone]            Contact information for after-discharge care    Destination    [facility] Follow up.   Service:  Skilled Nursing Contact information: [address] [place] [address] [phone]                 Allergies  Allergen Reactions  . Prednisone  Hives, Nausea And Vomiting and Swelling    Joint swelling, also  . Adhesive [Tape] Rash    Consultations:  Neurology   PCCM   Surgery   IR    Procedures/Studies: Ct Abdomen Pelvis Wo Contrast  Result Date: 01/02/2018 CLINICAL DATA:  Periumbilical pain with nausea and vomiting. EXAM: CT ABDOMEN AND PELVIS WITHOUT CONTRAST  TECHNIQUE: Multidetector CT imaging of the abdomen and pelvis was performed following the standard protocol without IV contrast. COMPARISON:  Abdominal radiograph dated 01/01/2018 FINDINGS: Lower chest: Minimal left base atelectasis. Coronary artery calcifications. Hepatobiliary: Numerous gallstones. Thickening of the gallbladder wall. The gallbladder is distended. Common bile duct is dilated to a diameter of 8 mm. No dilated intrahepatic bile ducts. Pancreas: Unremarkable. No pancreatic ductal dilatation or surrounding inflammatory changes. Spleen: Normal in size without focal abnormality. Adrenals/Urinary Tract: Adrenal glands are unremarkable. Kidneys are normal, without renal calculi, focal lesion, or hydronephrosis. Bladder is unremarkable. Stomach/Bowel: Stomach is within normal limits. Appendix appears normal. No evidence of bowel wall thickening, distention, or inflammatory changes. Scattered diverticula in the descending colon. Vascular/Lymphatic: Aortic atherosclerosis. No enlarged abdominal or pelvic lymph nodes. Reproductive: Prostate is unremarkable. Other: Tiny amount of free fluid in the pelvis. Slight soft tissue stranding in the perinephric spaces and pericolic gutters. No free air. Musculoskeletal: Bilateral hip prostheses. Chronic fusion of the sacroiliac joints. IMPRESSION: Multiple gallstones with thickening and distention of the gallbladder with slight haziness in the pericholecystic fat which could represent acute or chronic cholecystitis. Small amount of free fluid in the pelvis. Electronically Signed   By: Francene Boyers M.D.   On: 01/02/2018 12:59   Dg  Abd 1 View  Result Date: 01/01/2018 CLINICAL DATA:  Nausea and vomiting. EXAM: ABDOMEN - 1 VIEW COMPARISON:  December 31, 2017 FINDINGS: Vascular calcifications in the pelvis. Bilateral hip replacements. No evidence of bowel obstruction. No cause for nausea and vomiting identified. IMPRESSION: Negative. Electronically Signed   By: Gerome Sam III M.D   On: 01/01/2018 15:36   Dg Abd 1 View  Result Date: 12/31/2017 CLINICAL DATA:  Abdominal pain, nausea. EXAM: ABDOMEN - 1 VIEW COMPARISON:  Radiograph of December 30, 2017. FINDINGS: The bowel gas pattern is normal. Vascular calcifications and phleboliths are noted in the pelvis. IMPRESSION: No evidence of bowel obstruction or ileus. Electronically Signed   By: Lupita Raider, M.D.   On: 12/31/2017 09:48   Dg Abd 1 View  Result Date: 12/30/2017 CLINICAL DATA:  Abdominal pain. EXAM: ABDOMEN - 1 VIEW COMPARISON:  Chest x-ray 12/26/2016. FINDINGS: Air-filled loops of small and large bowel are noted suggesting adynamic ileus. Follow-up exam suggested to exclude bowel obstruction. Upright exam can be obtained to evaluate for free air as needed. Pelvic calcifications consistent phleboliths. Peripheral vascular calcification noted. Degenerative changes lumbar spine with scoliosis concave right. Bilateral hip replacements. IMPRESSION: Air-filled loops of small and large bowel noted suggesting adynamic ileus. Follow-up exam suggested to exclude bowel obstruction. Electronically Signed   By: Maisie Fus  Register   On: 12/30/2017 11:34   Ct Head Wo Contrast  Result Date: 12/27/2017 CLINICAL DATA:  Followup intracranial hemorrhage. EXAM: CT HEAD WITHOUT CONTRAST TECHNIQUE: Contiguous axial images were obtained from the base of the skull through the vertex without intravenous contrast. COMPARISON:  12/08/2017 FINDINGS: Brain: Recent intraparenchymal hematoma in the right thalamus is now isodense to edematous brain, with only small flecks of residual hyperdensity. No  evidence of additional bleeding. There does continue to be edema/swelling in the right thalamus. Old hemorrhagic infarction in the right basal ganglia with atrophy and encephalomalacia in that region. Intraventricular blood previously seen has resolved. Ventricular size is stable. No new brain insult. No extra-axial collection. Vascular: There is atherosclerotic calcification of the major vessels at the base of the brain. Skull: Normal Sinuses/Orbits: Clear sinuses. Mastoid effusions. No orbital abnormality. Other: None IMPRESSION: Recent right thalamic intraparenchymal  hematoma is now isodense to edematous brain. No evidence of additional bleeding. Old hemorrhagic infarction in the right basal ganglia. Resolution of intraventricular blood.  Ventricular size stable. Electronically Signed   By: Paulina Fusi M.D.   On: 12/27/2017 06:57   Nm Hepatobiliary Liver Func  Addendum Date: 01/04/2018   ADDENDUM REPORT: 01/04/2018 11:57 ADDENDUM: These results were called by telephone at the time of interpretation on 01/04/2018 at 11:45 am to NP [facility], who verbally acknowledged these results. Electronically Signed   By: Ted Mcalpine M.D.   On: 01/04/2018 11:57   Result Date: 01/04/2018 CLINICAL DATA:  Abdominal pain. Abnormal appearance of the gallbladder by CT. EXAM: NUCLEAR MEDICINE HEPATOBILIARY IMAGING WITH GALLBLADDER EF TECHNIQUE: Sequential images of the abdomen were obtained out to 60 minutes following intravenous administration of radiopharmaceutical. RADIOPHARMACEUTICALS:  8.03 mCi Tc-61m Choletec IV 2.1 mg of morphine were administered intravenously due to nonvisualization of the gallbladder at 60 minutes. COMPARISON:  None. FINDINGS: Prompt uptake and biliary excretion of activity by the liver is seen. Gallbladder activity is not visualized consistent with occlusion of the cystic duct. Biliary activity passes into small bowel, consistent with patent common bile duct, although there was a delayed  radiotracer passage to small bowel, first seen at 65 minutes. Calculated gallbladder ejection fraction was not calculated. IMPRESSION: Nonvisualization of the gallbladder consistent with occlusion at the level of the cystic duct, consistent with acute cholecystitis. Slow progression of radiotracer to the small bowel, consistent with at least partially patent extrahepatic common bile duct. Electronically Signed: By: Ted Mcalpine M.D. On: 01/04/2018 11:44   Ir Perc Cholecystostomy  Result Date: 01/05/2018 INDICATION: 52 year old male with acute calculus cholecystitis. He is currently a poor operative candidate due to a relatively recent history of stroke (12/07/2017). He presents for placement of a percutaneous cholecystostomy tube. EXAM: CHOLECYSTOSTOMY MEDICATIONS: 400 mg ciprofloxacin; The antibiotic was administered within an appropriate time frame prior to the initiation of the procedure. ANESTHESIA/SEDATION: Moderate (conscious) sedation was employed during this procedure. A total of Versed 2 mg and Fentanyl 100 mcg was administered intravenously. Moderate Sedation Time: 16 minutes. The patient's level of consciousness and vital signs were monitored continuously by radiology nursing throughout the procedure under my direct supervision. FLUOROSCOPY TIME:  Fluoroscopy Time: 0 minutes 42 seconds (1 mGy). COMPLICATIONS: None immediate. PROCEDURE: Informed written consent was obtained from the patient after a thorough discussion of the procedural risks, benefits and alternatives. All questions were addressed. Maximal Sterile Barrier Technique was utilized including caps, mask, sterile gowns, sterile gloves, sterile drape, hand hygiene and skin antiseptic. A timeout was performed prior to the initiation of the procedure. The right upper quadrant was interrogated with ultrasound. The gallbladder is thickened and contains internal sludge and small stones. The liver is echogenic. A suitable skin entry site was  selected and marked. Local anesthesia was attained by infiltration with 1% lidocaine. A small dermatotomy was made. Under real-time sonographic guidance, the gallbladder lumen was punctured using a 21 gauge Accustick needle. The needle was passed through a short segment of hepatic parenchyma before entering the gallbladder lumen. A 0.018 wire was then coiled within the gallbladder lumen. The Accustick needle was removed and the Accustick sheath advanced over the wire and into the gallbladder lumen. A gentle hand injection of contrast material was performed opacifying the gallbladder lumen. An Amplatz wire was then advanced through the Accustick sheath which was exchanged and the skin tract dilated to 10 [place]. A Cook 10.2 [place] all-purpose drainage catheter  was then advanced over the wire and formed in the gallbladder lumen. A final image was stored for the medical record. The catheter was secured to the skin with 0 Prolene suture and connected to gravity bag drainage. IMPRESSION: Successful placement of a transhepatic percutaneous cholecystostomy tube for acute calculus cholecystitis. PLAN: 1. Maintain tube to gravity bag drainage. 2. Patient will require tube evaluation and exchange every 8 weeks until he becomes a suitable operative candidate and can undergo definitive cholecystectomy. Signed, Sterling Big, MD [facility] Electronically Signed   By: Malachy Moan M.D.   On: 01/05/2018 17:27   Dg Chest Port 1 View  Result Date: 01/04/2018 CLINICAL DATA:  Sepsis. EXAM: PORTABLE CHEST 1 VIEW COMPARISON:  01/02/2018 FINDINGS: The cardiomediastinal silhouette is unchanged. The lungs are mildly hypoinflated. No confluent airspace opacity, edema, sizable pleural effusion, or pneumothorax is identified. There has been prior internal fixation of a proximal left humerus fracture. IMPRESSION: Hypoinflation.  No evidence of acute airspace disease.  Electronically Signed   By: Sebastian Ache M.D.   On: 01/04/2018 06:48   Dg Chest Port 1 View  Result Date: 01/02/2018 CLINICAL DATA:  Fever EXAM: PORTABLE CHEST 1 VIEW COMPARISON:  12/26/2017 FINDINGS: Lungs are clear.  No pleural effusion or pneumothorax. The heart is normal in size. IMPRESSION: No evidence of acute cardiopulmonary disease. Electronically Signed   By: Charline Bills M.D.   On: 01/02/2018 08:42   Dg Chest Port 1 View  Result Date: 12/26/2017 CLINICAL DATA:  Shortness of breath.  CVA. EXAM: PORTABLE CHEST 1 VIEW COMPARISON:  11/02/2016 and prior exams FINDINGS: The cardiomediastinal silhouette is unremarkable. There is no evidence of focal airspace disease, pulmonary edema, suspicious pulmonary nodule/mass, pleural effusion, or pneumothorax. No acute bony abnormalities are identified. IMPRESSION: No active disease. Electronically Signed   By: Harmon Pier M.D.   On: 12/26/2017 14:47   Dg Shoulder Left  Result Date: 01/06/2018 CLINICAL DATA:  LEFT shoulder pain question dislocation, history of stroke, cannot feel or move LEFT arm EXAM: LEFT SHOULDER - 2+ VIEW COMPARISON:  Operative images of 09/04/2015 FINDINGS: Diffuse osseous demineralization. AC joint alignment normal. Mild inferior subluxation of LEFT humeral head. Old healed posttraumatic deformity of the surgical neck LEFT humerus with orthopedic hardware. One of the pegs at the LEFT humeral head extends beyond the cortical margin of the LEFT humeral head likely into the LEFT glenohumeral joint space. No acute fracture, dislocation, or bone destruction. IMPRESSION: Osseous demineralization without acute fracture or dislocation. A PEG of the orthopedic hardware at the LEFT humeral head extends extra osseous at the LEFT humeral head likely into the LEFT shoulder joint space. Electronically Signed   By: [name] M.D.   On: 01/06/2018 20:05     Discharge Exam: Vitals:   01/11/18 0300 01/11/18 0533  BP: 129/70 126/70  Pulse: 86  83  Resp: 18 18  Temp: 98 F (36.7 C) 97.8 F (36.6 C)  SpO2: 99% 98%   Vitals:   01/10/18 1756 01/10/18 2125 01/11/18 0300 01/11/18 0533  BP: 130/82 128/76 129/70 126/70  Pulse: 93 89 86 83  Resp: 18 19 18 18   Temp: 97.6 F (36.4 C) 98.3 F (36.8 C) 98 F (36.7 C) 97.8 F (36.6 C)  TempSrc: Oral Oral Oral Oral  SpO2: 98% 99% 99% 98%  Weight:      Height:        General: NAD Cardiovascular: RRR, S1/S2  Respiratory: CTA bilaterally Abdominal:  Soft, NT, ND Extremities: Left side hemiparesis    The results of significant diagnostics from this hospitalization (including imaging, microbiology, ancillary and laboratory) are listed below for reference.     Microbiology: Recent Results (from the past 240 hour(s))  Culture, blood (Routine X 2) w Reflex to ID Panel     Status: Abnormal   Collection Time: 01/02/18  8:35 AM  Result Value Ref Range Status   Specimen Description BLOOD SITE NOT SPECIFIED  Final   Special Requests   Final    BOTTLES DRAWN AEROBIC AND ANAEROBIC Blood Culture adequate volume   Culture  Setup Time   Final    GRAM NEGATIVE RODS IN BOTH AEROBIC AND ANAEROBIC BOTTLES CRITICAL RESULT CALLED TO, READ BACK BY AND VERIFIED WITH: G ABOTT PHARMD 01/03/18 1252 JDW    Culture (A)  Final    ESCHERICHIA COLI SUSCEPTIBILITIES PERFORMED ON PREVIOUS CULTURE WITHIN THE LAST 5 DAYS. Performed at [facility], [address] [place], [place] [address]    Report Status 01/05/2018 FINAL  Final  Culture, blood (Routine X 2) w Reflex to ID Panel     Status: Abnormal   Collection Time: 01/02/18  8:36 AM  Result Value Ref Range Status   Specimen Description BLOOD SITE NOT SPECIFIED  Final   Special Requests   Final    BOTTLES DRAWN AEROBIC AND ANAEROBIC Blood Culture adequate volume   Culture  Setup Time   Final    GRAM NEGATIVE RODS IN BOTH AEROBIC AND ANAEROBIC BOTTLES CRITICAL RESULT CALLED TO, READ BACK BY AND VERIFIED WITH: G ABOTT PHARMD 01/03/18 1252  JDW Performed at [facility], [address], [place], [place] [address]    Culture ESCHERICHIA COLI (A)  Final   Report Status 01/05/2018 FINAL  Final   Organism ID, Bacteria ESCHERICHIA COLI  Final      Susceptibility   Escherichia coli - MIC*    AMPICILLIN <=2 SENSITIVE Sensitive     CEFAZOLIN <=4 SENSITIVE Sensitive     CEFEPIME <=1 SENSITIVE Sensitive     CEFTAZIDIME <=1 SENSITIVE Sensitive     CEFTRIAXONE <=1 SENSITIVE Sensitive     CIPROFLOXACIN <=0.25 SENSITIVE Sensitive     GENTAMICIN <=1 SENSITIVE Sensitive     IMIPENEM <=0.25 SENSITIVE Sensitive     TRIMETH/SULFA <=20 SENSITIVE Sensitive     AMPICILLIN/SULBACTAM <=2 SENSITIVE Sensitive     PIP/TAZO <=4 SENSITIVE Sensitive     Extended ESBL NEGATIVE Sensitive     * ESCHERICHIA COLI  Blood Culture ID Panel (Reflexed)     Status: Abnormal   Collection Time: 01/02/18  8:36 AM  Result Value Ref Range Status   Enterococcus species NOT DETECTED NOT DETECTED Final   Listeria monocytogenes NOT DETECTED NOT DETECTED Final   Staphylococcus species NOT DETECTED NOT DETECTED Final   Staphylococcus aureus NOT DETECTED NOT DETECTED Final   Streptococcus species NOT DETECTED NOT DETECTED Final   Streptococcus agalactiae NOT DETECTED NOT DETECTED Final   Streptococcus pneumoniae NOT DETECTED NOT DETECTED Final   Streptococcus pyogenes NOT DETECTED NOT DETECTED Final   Acinetobacter baumannii NOT DETECTED NOT DETECTED Final   Enterobacteriaceae species DETECTED (A) NOT DETECTED Final    Comment: Enterobacteriaceae represent a large family of gram-negative bacteria, not a single organism. CRITICAL RESULT CALLED TO, READ BACK BY AND VERIFIED WITH: G ABOTT PHARMD 01/03/18 1252 JDW    Enterobacter cloacae complex NOT DETECTED NOT DETECTED Final   Escherichia coli DETECTED (A) NOT DETECTED  Final    Comment: CRITICAL RESULT CALLED TO, READ BACK BY AND VERIFIED WITH: G ABOTT PHARMD 01/03/18 1252 JDW    Klebsiella oxytoca NOT DETECTED  NOT DETECTED Final   Klebsiella pneumoniae NOT DETECTED NOT DETECTED Final   Proteus species NOT DETECTED NOT DETECTED Final   Serratia marcescens NOT DETECTED NOT DETECTED Final   Carbapenem resistance NOT DETECTED NOT DETECTED Final   Haemophilus influenzae NOT DETECTED NOT DETECTED Final   Neisseria meningitidis NOT DETECTED NOT DETECTED Final   Pseudomonas aeruginosa NOT DETECTED NOT DETECTED Final   Candida albicans NOT DETECTED NOT DETECTED Final   Candida glabrata NOT DETECTED NOT DETECTED Final   Candida krusei NOT DETECTED NOT DETECTED Final   Candida parapsilosis NOT DETECTED NOT DETECTED Final   Candida tropicalis NOT DETECTED NOT DETECTED Final  MRSA PCR Screening     Status: None   Collection Time: 01/03/18  4:59 PM  Result Value Ref Range Status   MRSA by PCR NEGATIVE NEGATIVE Final    Comment:        The GeneXpert MRSA Assay (FDA approved for NASAL specimens only), is one component of a comprehensive MRSA colonization surveillance program. It is not intended to diagnose MRSA infection nor to guide or monitor treatment for MRSA infections. Performed at [facility], [address] [place], [place] [address]   Culture, blood (Routine X 2) w Reflex to ID Panel     Status: None (Preliminary result)   Collection Time: 01/08/18  8:52 PM  Result Value Ref Range Status   Specimen Description BLOOD RIGHT ANTECUBITAL  Final   Special Requests   Final    BOTTLES DRAWN AEROBIC AND ANAEROBIC Blood Culture adequate volume   Culture   Final    NO GROWTH 2 DAYS Performed at [facility], [address], [place], [place] [address]    Report Status PENDING  Incomplete  Culture, blood (Routine X 2) w Reflex to ID Panel     Status: None (Preliminary result)   Collection Time: 01/08/18  8:58 PM  Result Value Ref Range Status   Specimen Description BLOOD RIGHT HAND  Final   Special Requests   Final    BOTTLES DRAWN AEROBIC ONLY Blood Culture adequate volume    Culture   Final    NO GROWTH 2 DAYS Performed at [facility], [address] [place], [place] [address]    Report Status PENDING  Incomplete     Labs: BNP (last 3 results) No results for input(s): BNP in the last 8760 hours. Basic Metabolic Panel: Recent Labs  Lab 01/05/18 0330 01/06/18 0549 01/08/18 0522 01/10/18 0237  NA 133* 130* 132* 133*  K 4.8 5.4* 4.5 4.1  CL 97* 93* 93* 92*  CO2 22 19* 22 21*  GLUCOSE 79 100* 109* 102*  BUN 39* 48* 29* 26*  CREATININE 5.85* 7.19* 6.45* 6.28*  CALCIUM 8.7* 8.7* 8.9 9.1  MG  --  2.1  --   --   PHOS 2.9 4.3  --  4.4   Liver Function Tests: Recent Labs  Lab 01/05/18 0330 01/10/18 0237  AST 68*  --   ALT 53  --   ALKPHOS 663*  --   BILITOT 1.7*  --   PROT 6.0*  --   ALBUMIN 2.8* 3.1*   Recent Labs  Lab 01/05/18 0330  LIPASE 658*   No results for input(s): AMMONIA in the last 168 hours. CBC: Recent Labs  Lab 01/05/18 0330 01/05/18 1555 01/06/18 0859 01/08/18 0522  WBC 7.3 8.0 8.1 8.2  NEUTROABS 4.6  --   --   --   HGB 11.1* 12.9* 11.0* 12.4*  HCT 35.1* 40.3 34.3* 38.6*  MCV 91.2 91.2 88.9 89.6  PLT 275 291 281 337   Cardiac Enzymes: No results for input(s): CKTOTAL, CKMB, CKMBINDEX, TROPONINI in the last 168 hours. BNP: Invalid input(s): POCBNP CBG: Recent Labs  Lab 01/10/18 1728 01/10/18 2005 01/11/18 0003 01/11/18 0111 01/11/18 0438  GLUCAP 178* 206* 52* 119* 118*   D-Dimer No results for input(s): DDIMER in the last 72 hours. Hgb A1c No results for input(s): HGBA1C in the last 72 hours. Lipid Profile No results for input(s): CHOL, HDL, LDLCALC, TRIG, CHOLHDL, LDLDIRECT in the last 72 hours. Thyroid function studies No results for input(s): TSH, T4TOTAL, T3FREE, THYROIDAB in the last 72 hours.  Invalid input(s): FREET3 Anemia work up No results for input(s): VITAMINB12, FOLATE, FERRITIN, TIBC, IRON, RETICCTPCT in the last 72 hours. Urinalysis    Component Value Date/Time   COLORURINE  AMBER (A) 01/03/2018 1228   APPEARANCEUR HAZY (A) 01/03/2018 1228   LABSPEC 1.016 01/03/2018 1228   PHURINE 9.0 (H) 01/03/2018 1228   GLUCOSEU 150 (A) 01/03/2018 1228   HGBUR NEGATIVE 01/03/2018 1228   BILIRUBINUR NEGATIVE 01/03/2018 1228   BILIRUBINUR neg 08/11/2013 1646   KETONESUR 5 (A) 01/03/2018 1228   PROTEINUR >=300 (A) 01/03/2018 1228   UROBILINOGEN 0.2 09/30/2015 1203   NITRITE NEGATIVE 01/03/2018 1228   LEUKOCYTESUR NEGATIVE 01/03/2018 1228   Sepsis Labs Invalid input(s): PROCALCITONIN,  WBC,  LACTICIDVEN Microbiology Recent Results (from the past 240 hour(s))  Culture, blood (Routine X 2) w Reflex to ID Panel     Status: Abnormal   Collection Time: 01/02/18  8:35 AM  Result Value Ref Range Status   Specimen Description BLOOD SITE NOT SPECIFIED  Final   Special Requests   Final    BOTTLES DRAWN AEROBIC AND ANAEROBIC Blood Culture adequate volume   Culture  Setup Time   Final    GRAM NEGATIVE RODS IN BOTH AEROBIC AND ANAEROBIC BOTTLES CRITICAL RESULT CALLED TO, READ BACK BY AND VERIFIED WITH: G ABOTT PHARMD 01/03/18 1252 JDW    Culture (A)  Final    ESCHERICHIA COLI SUSCEPTIBILITIES PERFORMED ON PREVIOUS CULTURE WITHIN THE LAST 5 DAYS. Performed at [facility], [address] [place], [place] [address]    Report Status 01/05/2018 FINAL  Final  Culture, blood (Routine X 2) w Reflex to ID Panel     Status: Abnormal   Collection Time: 01/02/18  8:36 AM  Result Value Ref Range Status   Specimen Description BLOOD SITE NOT SPECIFIED  Final   Special Requests   Final    BOTTLES DRAWN AEROBIC AND ANAEROBIC Blood Culture adequate volume   Culture  Setup Time   Final    GRAM NEGATIVE RODS IN BOTH AEROBIC AND ANAEROBIC BOTTLES CRITICAL RESULT CALLED TO, READ BACK BY AND VERIFIED WITH: G ABOTT PHARMD 01/03/18 1252 JDW Performed at [facility], [address] [place], [place] [address]    Culture ESCHERICHIA COLI (A)  Final   Report Status 01/05/2018 FINAL   Final   Organism ID, Bacteria ESCHERICHIA COLI  Final      Susceptibility   Escherichia coli - MIC*    AMPICILLIN <=2 SENSITIVE Sensitive     CEFAZOLIN <=4 SENSITIVE Sensitive     CEFEPIME <=1 SENSITIVE Sensitive  CEFTAZIDIME <=1 SENSITIVE Sensitive     CEFTRIAXONE <=1 SENSITIVE Sensitive     CIPROFLOXACIN <=0.25 SENSITIVE Sensitive     GENTAMICIN <=1 SENSITIVE Sensitive     IMIPENEM <=0.25 SENSITIVE Sensitive     TRIMETH/SULFA <=20 SENSITIVE Sensitive     AMPICILLIN/SULBACTAM <=2 SENSITIVE Sensitive     PIP/TAZO <=4 SENSITIVE Sensitive     Extended ESBL NEGATIVE Sensitive     * ESCHERICHIA COLI  Blood Culture ID Panel (Reflexed)     Status: Abnormal   Collection Time: 01/02/18  8:36 AM  Result Value Ref Range Status   Enterococcus species NOT DETECTED NOT DETECTED Final   Listeria monocytogenes NOT DETECTED NOT DETECTED Final   Staphylococcus species NOT DETECTED NOT DETECTED Final   Staphylococcus aureus NOT DETECTED NOT DETECTED Final   Streptococcus species NOT DETECTED NOT DETECTED Final   Streptococcus agalactiae NOT DETECTED NOT DETECTED Final   Streptococcus pneumoniae NOT DETECTED NOT DETECTED Final   Streptococcus pyogenes NOT DETECTED NOT DETECTED Final   Acinetobacter baumannii NOT DETECTED NOT DETECTED Final   Enterobacteriaceae species DETECTED (A) NOT DETECTED Final    Comment: Enterobacteriaceae represent a large family of gram-negative bacteria, not a single organism. CRITICAL RESULT CALLED TO, READ BACK BY AND VERIFIED WITH: G ABOTT PHARMD 01/03/18 1252 JDW    Enterobacter cloacae complex NOT DETECTED NOT DETECTED Final   Escherichia coli DETECTED (A) NOT DETECTED Final    Comment: CRITICAL RESULT CALLED TO, READ BACK BY AND VERIFIED WITH: G ABOTT PHARMD 01/03/18 1252 JDW    Klebsiella oxytoca NOT DETECTED NOT DETECTED Final   Klebsiella pneumoniae NOT DETECTED NOT DETECTED Final   Proteus species NOT DETECTED NOT DETECTED Final   Serratia marcescens NOT  DETECTED NOT DETECTED Final   Carbapenem resistance NOT DETECTED NOT DETECTED Final   Haemophilus influenzae NOT DETECTED NOT DETECTED Final   Neisseria meningitidis NOT DETECTED NOT DETECTED Final   Pseudomonas aeruginosa NOT DETECTED NOT DETECTED Final   Candida albicans NOT DETECTED NOT DETECTED Final   Candida glabrata NOT DETECTED NOT DETECTED Final   Candida krusei NOT DETECTED NOT DETECTED Final   Candida parapsilosis NOT DETECTED NOT DETECTED Final   Candida tropicalis NOT DETECTED NOT DETECTED Final  MRSA PCR Screening     Status: None   Collection Time: 01/03/18  4:59 PM  Result Value Ref Range Status   MRSA by PCR NEGATIVE NEGATIVE Final    Comment:        The GeneXpert MRSA Assay (FDA approved for NASAL specimens only), is one component of a comprehensive MRSA colonization surveillance program. It is not intended to diagnose MRSA infection nor to guide or monitor treatment for MRSA infections. Performed at [facility], [address], [place], [place] [address]   Culture, blood (Routine X 2) w Reflex to ID Panel     Status: None (Preliminary result)   Collection Time: 01/08/18  8:52 PM  Result Value Ref Range Status   Specimen Description BLOOD RIGHT ANTECUBITAL  Final   Special Requests   Final    BOTTLES DRAWN AEROBIC AND ANAEROBIC Blood Culture adequate volume   Culture   Final    NO GROWTH 2 DAYS Performed at [facility], [address], [place], [place] [address]    Report Status PENDING  Incomplete  Culture, blood (Routine X 2) w Reflex to ID Panel     Status: None (Preliminary result)   Collection Time: 01/08/18  8:58 PM  Result  Value Ref Range Status   Specimen Description BLOOD RIGHT HAND  Final   Special Requests   Final    BOTTLES DRAWN AEROBIC ONLY Blood Culture adequate volume   Culture   Final    NO GROWTH 2 DAYS Performed at [facility], [address], [place], [place] [address]    Report Status PENDING  Incomplete      Time coordinating discharge: 32 minutes  SIGNED:  Latrelle Dodrill, MD  [facility] 01/11/2018, 7:45 AM  Pager please text page via  [email or web address]

## 2018-01-11 NOTE — Progress Notes (Signed)
Hypoglycemic Event  CBG: 52 Treatment: 15 GM carbohydrate snack  Symptoms: None  Follow-up CBG: Time:0111CBG Result:119 Possible Reasons for Event: Inadequate meal intake and Other:  had dialysis 01/10/18  Comments/MD notified:no   Derrel Nip

## 2018-01-11 NOTE — Progress Notes (Signed)
Received a phone call from pt's partner phil and he is frustrated because facility supposedly didn't know anything about the pt coming. Attempted to call social worker again. Voicemail is full, can't leave a message.

## 2018-01-11 NOTE — Progress Notes (Signed)
AVS and prescriptions given to PTAR to take to facility.  Attempted to call report to [phone], but number wasn't working. Will give my number to PTAR so that the facility can call me to get report and will reach out to Clarisse Gouge (the Child psychotherapist) to see if that is the right number to call.

## 2018-01-13 LAB — CULTURE, BLOOD (ROUTINE X 2)
CULTURE: NO GROWTH
Culture: NO GROWTH
Special Requests: ADEQUATE
Special Requests: ADEQUATE

## 2018-01-28 ENCOUNTER — Other Ambulatory Visit: Payer: Self-pay | Admitting: Surgery

## 2018-01-28 DIAGNOSIS — K8012 Calculus of gallbladder with acute and chronic cholecystitis without obstruction: Secondary | ICD-10-CM

## 2018-02-06 NOTE — Care Management (Signed)
This is a no charge note  Pending admission per Dr.   Atlee Abide from [facility] per Dr. 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  Patient was recently hospitalized from 01/6-2/10 due to septic shock 2/2 Escherichia coli bacteremia and acute cholecystitis/pancreatitis. Surgeon was consulted, but not candidate for surgery. Patient underwent percutaneous drainage by IR on 01/05/18. He is discharged to nursing home. Today patient presented to the [facility] ED due to fever of 101, chills, nausea, vomiting, right upper quadrant abdominal pain in past 3 days. CT scan showed gallstone and possible colitis.    Patient was found to have AST 27, ALT 21, ALP 341, total bilirubin 1.3, WBC 9.4, potassium 3.6, bicarbonate 27, creatinine 5.1, BUN 18, temperature 101, no tachycardia, no tachypnea, O2 sat 97% on room air. Pt was started with IV zosyn. Pt is accepted to tele bed as inpt. May need to call IR in AM.  Please call manager of [facility] at [phone] when pt arrives to floor   Lorretta Harp, MD  [facility] Pager [phone]  If 7PM-7AM, please contact night-coverage [email or web address] Password [facility] 02/06/2018, 9:46 PM

## 2018-02-07 ENCOUNTER — Other Ambulatory Visit: Payer: Self-pay

## 2018-02-07 ENCOUNTER — Inpatient Hospital Stay (HOSPITAL_COMMUNITY)
Admission: AD | Admit: 2018-02-07 | Discharge: 2018-02-10 | DRG: 919 | Disposition: A | Discharge status: Skilled Nursing Facility | Payer: Self-pay | Source: Other Acute Inpatient Hospital | Attending: Internal Medicine | Admitting: Internal Medicine

## 2018-02-07 ENCOUNTER — Encounter (HOSPITAL_COMMUNITY): Payer: Self-pay | Admitting: *Deleted

## 2018-02-07 ENCOUNTER — Inpatient Hospital Stay (HOSPITAL_COMMUNITY): Payer: Self-pay

## 2018-02-07 DIAGNOSIS — N2581 Secondary hyperparathyroidism of renal origin: Secondary | ICD-10-CM | POA: Diagnosis present

## 2018-02-07 DIAGNOSIS — I69351 Hemiplegia and hemiparesis following cerebral infarction affecting right dominant side: Secondary | ICD-10-CM

## 2018-02-07 DIAGNOSIS — Z992 Dependence on renal dialysis: Secondary | ICD-10-CM

## 2018-02-07 DIAGNOSIS — F17210 Nicotine dependence, cigarettes, uncomplicated: Secondary | ICD-10-CM | POA: Diagnosis present

## 2018-02-07 DIAGNOSIS — K219 Gastro-esophageal reflux disease without esophagitis: Secondary | ICD-10-CM | POA: Diagnosis present

## 2018-02-07 DIAGNOSIS — F1010 Alcohol abuse, uncomplicated: Secondary | ICD-10-CM | POA: Diagnosis present

## 2018-02-07 DIAGNOSIS — D638 Anemia in other chronic diseases classified elsewhere: Secondary | ICD-10-CM | POA: Diagnosis present

## 2018-02-07 DIAGNOSIS — E785 Hyperlipidemia, unspecified: Secondary | ICD-10-CM | POA: Diagnosis present

## 2018-02-07 DIAGNOSIS — Y848 Other medical procedures as the cause of abnormal reaction of the patient, or of later complication, without mention of misadventure at the time of the procedure: Secondary | ICD-10-CM | POA: Diagnosis present

## 2018-02-07 DIAGNOSIS — K819 Cholecystitis, unspecified: Secondary | ICD-10-CM | POA: Diagnosis present

## 2018-02-07 DIAGNOSIS — Z96643 Presence of artificial hip joint, bilateral: Secondary | ICD-10-CM | POA: Diagnosis present

## 2018-02-07 DIAGNOSIS — K7030 Alcoholic cirrhosis of liver without ascites: Secondary | ICD-10-CM | POA: Diagnosis present

## 2018-02-07 DIAGNOSIS — N186 End stage renal disease: Secondary | ICD-10-CM

## 2018-02-07 DIAGNOSIS — Z833 Family history of diabetes mellitus: Secondary | ICD-10-CM

## 2018-02-07 DIAGNOSIS — E1142 Type 2 diabetes mellitus with diabetic polyneuropathy: Secondary | ICD-10-CM | POA: Diagnosis present

## 2018-02-07 DIAGNOSIS — I6930 Unspecified sequelae of cerebral infarction: Secondary | ICD-10-CM

## 2018-02-07 DIAGNOSIS — K8010 Calculus of gallbladder with chronic cholecystitis without obstruction: Secondary | ICD-10-CM | POA: Diagnosis present

## 2018-02-07 DIAGNOSIS — I619 Nontraumatic intracerebral hemorrhage, unspecified: Secondary | ICD-10-CM | POA: Diagnosis present

## 2018-02-07 DIAGNOSIS — G8114 Spastic hemiplegia affecting left nondominant side: Secondary | ICD-10-CM | POA: Diagnosis present

## 2018-02-07 DIAGNOSIS — E1129 Type 2 diabetes mellitus with other diabetic kidney complication: Secondary | ICD-10-CM | POA: Diagnosis present

## 2018-02-07 DIAGNOSIS — I120 Hypertensive chronic kidney disease with stage 5 chronic kidney disease or end stage renal disease: Secondary | ICD-10-CM | POA: Diagnosis present

## 2018-02-07 DIAGNOSIS — E1122 Type 2 diabetes mellitus with diabetic chronic kidney disease: Secondary | ICD-10-CM | POA: Diagnosis present

## 2018-02-07 DIAGNOSIS — I10 Essential (primary) hypertension: Secondary | ICD-10-CM | POA: Diagnosis present

## 2018-02-07 DIAGNOSIS — Z720 Tobacco use: Secondary | ICD-10-CM | POA: Diagnosis present

## 2018-02-07 DIAGNOSIS — I69354 Hemiplegia and hemiparesis following cerebral infarction affecting left non-dominant side: Secondary | ICD-10-CM

## 2018-02-07 DIAGNOSIS — D649 Anemia, unspecified: Secondary | ICD-10-CM | POA: Diagnosis present

## 2018-02-07 DIAGNOSIS — M069 Rheumatoid arthritis, unspecified: Secondary | ICD-10-CM | POA: Diagnosis present

## 2018-02-07 DIAGNOSIS — T85528A Displacement of other gastrointestinal prosthetic devices, implants and grafts, initial encounter: Principal | ICD-10-CM | POA: Diagnosis present

## 2018-02-07 DIAGNOSIS — M109 Gout, unspecified: Secondary | ICD-10-CM | POA: Diagnosis present

## 2018-02-07 DIAGNOSIS — E11610 Type 2 diabetes mellitus with diabetic neuropathic arthropathy: Secondary | ICD-10-CM | POA: Diagnosis present

## 2018-02-07 DIAGNOSIS — K811 Chronic cholecystitis: Secondary | ICD-10-CM

## 2018-02-07 DIAGNOSIS — R0902 Hypoxemia: Secondary | ICD-10-CM

## 2018-02-07 HISTORY — PX: IR EXCHANGE BILIARY DRAIN: IMG6046

## 2018-02-07 LAB — CBC
HCT: 26.1 % — ABNORMAL LOW (ref 39.0–52.0)
Hemoglobin: 8.5 g/dL — ABNORMAL LOW (ref 13.0–17.0)
MCH: 26.9 pg (ref 26.0–34.0)
MCHC: 32.6 g/dL (ref 30.0–36.0)
MCV: 82.6 fL (ref 78.0–100.0)
Platelets: 194 10*3/uL (ref 150–400)
RBC: 3.16 MIL/uL — ABNORMAL LOW (ref 4.22–5.81)
RDW: 18.8 % — AB (ref 11.5–15.5)
WBC: 7.1 10*3/uL (ref 4.0–10.5)

## 2018-02-07 LAB — COMPREHENSIVE METABOLIC PANEL
ALBUMIN: 2.8 g/dL — AB (ref 3.5–5.0)
ALT: 17 U/L (ref 17–63)
AST: 22 U/L (ref 15–41)
Alkaline Phosphatase: 279 U/L — ABNORMAL HIGH (ref 38–126)
Anion gap: 12 (ref 5–15)
BUN: 21 mg/dL — AB (ref 6–20)
CHLORIDE: 99 mmol/L — AB (ref 101–111)
CO2: 24 mmol/L (ref 22–32)
Calcium: 8.5 mg/dL — ABNORMAL LOW (ref 8.9–10.3)
Creatinine, Ser: 6.11 mg/dL — ABNORMAL HIGH (ref 0.61–1.24)
GFR calc Af Amer: 11 mL/min — ABNORMAL LOW (ref >60)
GFR, EST NON AFRICAN AMERICAN: 9 mL/min — AB (ref >60)
GLUCOSE: 91 mg/dL (ref 65–99)
Potassium: 4.2 mmol/L (ref 3.5–5.1)
Sodium: 135 mmol/L (ref 135–145)
Total Bilirubin: 0.9 mg/dL (ref 0.3–1.2)
Total Protein: 6.2 g/dL — ABNORMAL LOW (ref 6.5–8.1)

## 2018-02-07 LAB — SURGICAL PCR SCREEN
MRSA, PCR: NEGATIVE
STAPHYLOCOCCUS AUREUS: NEGATIVE

## 2018-02-07 LAB — PROTIME-INR
INR: 1.03
Prothrombin Time: 13.4 seconds (ref 11.4–15.2)

## 2018-02-07 LAB — PROCALCITONIN: PROCALCITONIN: 6.38 ng/mL

## 2018-02-07 LAB — LACTIC ACID, PLASMA
LACTIC ACID, VENOUS: 0.7 mmol/L (ref 0.5–1.9)
LACTIC ACID, VENOUS: 0.7 mmol/L (ref 0.5–1.9)

## 2018-02-07 LAB — GLUCOSE, CAPILLARY: Glucose-Capillary: 91 mg/dL (ref 65–99)

## 2018-02-07 LAB — TYPE AND SCREEN
ABO/RH(D): A NEG
Antibody Screen: NEGATIVE

## 2018-02-07 LAB — APTT: APTT: 32 s (ref 24–36)

## 2018-02-07 MED ORDER — LIDOCAINE-PRILOCAINE 2.5-2.5 % EX CREA
1.0000 | TOPICAL_CREAM | CUTANEOUS | Status: DC | PRN
Start: 2018-02-07 — End: 2018-02-07

## 2018-02-07 MED ORDER — TRAZODONE HCL 50 MG PO TABS: 50.0000 mg | ORAL_TABLET | Freq: Every evening | ORAL | Status: DC | PRN

## 2018-02-07 MED ORDER — ACETAMINOPHEN 500 MG PO TABS: 500.0000 mg | ORAL_TABLET | Freq: Four times a day (QID) | ORAL | Status: DC | PRN

## 2018-02-07 MED ORDER — OXYCODONE HCL 5 MG PO TABS
ORAL_TABLET | ORAL | Status: AC
  Filled 2018-02-07: qty 1

## 2018-02-07 MED ORDER — LANTHANUM CARBONATE 500 MG PO CHEW: 1000.0000 mg | CHEWABLE_TABLET | Freq: Three times a day (TID) | ORAL | Status: DC

## 2018-02-07 MED ORDER — ASPIRIN EC 81 MG PO TBEC
81.0000 mg | DELAYED_RELEASE_TABLET | Freq: Every day | ORAL | Status: DC
  Administered 2018-02-07 – 2018-02-10 (×4): 81 mg via ORAL
  Filled 2018-02-07 (×5): qty 1

## 2018-02-07 MED ORDER — PENTAFLUOROPROP-TETRAFLUOROETH EX AERO
1.0000 | INHALATION_SPRAY | CUTANEOUS | Status: DC | PRN
Start: 2018-02-07 — End: 2018-02-07

## 2018-02-07 MED ORDER — ALTEPLASE 2 MG IJ SOLR: 2.0000 mg | Freq: Once | INTRAMUSCULAR | Status: DC | PRN

## 2018-02-07 MED ORDER — HEPARIN SODIUM (PORCINE) 1000 UNIT/ML DIALYSIS: 2000.0000 [IU] | INTRAMUSCULAR | Status: DC | PRN

## 2018-02-07 MED ORDER — SODIUM CHLORIDE 0.9 % IV SOLN
INTRAVENOUS | Status: DC
  Administered 2018-02-07: 05:00:00 via INTRAVENOUS

## 2018-02-07 MED ORDER — SODIUM CHLORIDE 0.9 % IV BOLUS (SEPSIS): 500.0000 mL | Freq: Once | INTRAVENOUS | Status: DC

## 2018-02-07 MED ORDER — AMLODIPINE BESYLATE 10 MG PO TABS
10.0000 mg | ORAL_TABLET | Freq: Every day | ORAL | Status: DC
  Administered 2018-02-07 – 2018-02-10 (×4): 10 mg via ORAL
  Filled 2018-02-07 (×3): qty 1
  Filled 2018-02-07: qty 2
  Filled 2018-02-07: qty 1

## 2018-02-07 MED ORDER — OXYCODONE-ACETAMINOPHEN 5-325 MG PO TABS: 1.0000 | ORAL_TABLET | Freq: Four times a day (QID) | ORAL | Status: DC | PRN

## 2018-02-07 MED ORDER — METRONIDAZOLE IN NACL 5-0.79 MG/ML-% IV SOLN
500.0000 mg | Freq: Three times a day (TID) | INTRAVENOUS | Status: DC
  Administered 2018-02-07 – 2018-02-09 (×7): 500 mg via INTRAVENOUS
  Filled 2018-02-07 (×7): qty 100

## 2018-02-07 MED ORDER — LIDOCAINE HCL 1 % IJ SOLN
INTRAMUSCULAR | Status: AC
  Filled 2018-02-07: qty 20

## 2018-02-07 MED ORDER — HEPARIN SODIUM (PORCINE) 1000 UNIT/ML DIALYSIS: 1000.0000 [IU] | INTRAMUSCULAR | Status: DC | PRN

## 2018-02-07 MED ORDER — DOXERCALCIFEROL 4 MCG/2ML IV SOLN
2.0000 ug | INTRAVENOUS | Status: DC
  Administered 2018-02-07 – 2018-02-10 (×2): 2 ug via INTRAVENOUS
  Filled 2018-02-07 (×2): qty 2

## 2018-02-07 MED ORDER — ACETAMINOPHEN 325 MG PO TABS: 325.0000 mg | ORAL_TABLET | Freq: Four times a day (QID) | ORAL | Status: DC | PRN

## 2018-02-07 MED ORDER — CALCIUM CARBONATE ANTACID 500 MG PO CHEW: 1.0000 | CHEWABLE_TABLET | Freq: Every day | ORAL | Status: DC

## 2018-02-07 MED ORDER — DOXERCALCIFEROL 4 MCG/2ML IV SOLN
INTRAVENOUS | Status: AC
Start: 2018-02-07 — End: 2018-02-07
  Administered 2018-02-07: 2 ug via INTRAVENOUS
  Filled 2018-02-07: qty 2

## 2018-02-07 MED ORDER — SODIUM CHLORIDE 0.9 % IV SOLN: 100.0000 mL | INTRAVENOUS | Status: DC | PRN

## 2018-02-07 MED ORDER — NICOTINE 21 MG/24HR TD PT24
21.0000 mg | MEDICATED_PATCH | Freq: Every day | TRANSDERMAL | Status: DC
  Filled 2018-02-07 (×3): qty 1

## 2018-02-07 MED ORDER — PANTOPRAZOLE SODIUM 40 MG PO TBEC
40.0000 mg | DELAYED_RELEASE_TABLET | Freq: Every day | ORAL | Status: DC
Start: 2018-02-07 — End: 2018-02-10
  Administered 2018-02-07 – 2018-02-10 (×4): 40 mg via ORAL
  Filled 2018-02-07 (×5): qty 1

## 2018-02-07 MED ORDER — SEVELAMER CARBONATE 800 MG PO TABS
1600.0000 mg | ORAL_TABLET | Freq: Two times a day (BID) | ORAL | Status: DC
  Administered 2018-02-08 – 2018-02-10 (×3): 1600 mg via ORAL
  Filled 2018-02-07 (×3): qty 2

## 2018-02-07 MED ORDER — LIDOCAINE-PRILOCAINE 2.5-2.5 % EX CREA: TOPICAL_CREAM | Freq: Every day | CUTANEOUS | Status: DC | PRN

## 2018-02-07 MED ORDER — POLYETHYLENE GLYCOL 3350 17 G PO PACK
17.0000 g | PACK | Freq: Two times a day (BID) | ORAL | Status: DC
  Administered 2018-02-09: 17 g via ORAL
  Filled 2018-02-07 (×3): qty 1

## 2018-02-07 MED ORDER — LIDOCAINE HCL (PF) 1 % IJ SOLN
5.0000 mL | INTRAMUSCULAR | Status: DC | PRN
Start: 2018-02-07 — End: 2018-02-07

## 2018-02-07 MED ORDER — TIZANIDINE HCL 2 MG PO TABS: 2.0000 mg | ORAL_TABLET | Freq: Three times a day (TID) | ORAL | Status: DC | PRN

## 2018-02-07 MED ORDER — SODIUM CHLORIDE 0.9 % IV SOLN
2.0000 g | INTRAVENOUS | Status: DC
  Administered 2018-02-08 – 2018-02-09 (×2): 2 g via INTRAVENOUS
  Filled 2018-02-07 (×3): qty 20

## 2018-02-07 MED ORDER — COLCHICINE 0.6 MG PO TABS
0.6000 mg | ORAL_TABLET | Freq: Every day | ORAL | Status: DC
  Administered 2018-02-07 – 2018-02-08 (×2): 0.6 mg via ORAL
  Filled 2018-02-07 (×2): qty 1

## 2018-02-07 MED ORDER — BENZONATATE 100 MG PO CAPS: 100.0000 mg | ORAL_CAPSULE | Freq: Three times a day (TID) | ORAL | Status: DC | PRN

## 2018-02-07 MED ORDER — SODIUM CHLORIDE 0.9 % IV SOLN
100.0000 mL | INTRAVENOUS | Status: DC | PRN
Start: 2018-02-07 — End: 2018-02-07

## 2018-02-07 MED ORDER — ONDANSETRON HCL 4 MG/2ML IJ SOLN
4.0000 mg | Freq: Three times a day (TID) | INTRAMUSCULAR | Status: DC | PRN
  Administered 2018-02-08 – 2018-02-09 (×3): 4 mg via INTRAVENOUS
  Filled 2018-02-07 (×4): qty 2

## 2018-02-07 MED ORDER — NEPRO/CARBSTEADY PO LIQD
237.0000 mL | Freq: Three times a day (TID) | ORAL | Status: DC
  Administered 2018-02-08 (×2): 237 mL via ORAL
  Filled 2018-02-07 (×11): qty 237

## 2018-02-07 MED ORDER — ZOLPIDEM TARTRATE 5 MG PO TABS
5.0000 mg | ORAL_TABLET | Freq: Every evening | ORAL | Status: DC | PRN
Start: 2018-02-07 — End: 2018-02-10

## 2018-02-07 MED ORDER — OXYCODONE HCL 5 MG PO TABS
5.0000 mg | ORAL_TABLET | ORAL | Status: DC | PRN
  Administered 2018-02-07 – 2018-02-10 (×3): 5 mg via ORAL
  Filled 2018-02-07 (×2): qty 1

## 2018-02-07 MED ORDER — CALCITRIOL 0.5 MCG PO CAPS: 0.7500 ug | ORAL_CAPSULE | ORAL | Status: DC

## 2018-02-07 MED ORDER — SEVELAMER CARBONATE 800 MG PO TABS
4000.0000 mg | ORAL_TABLET | Freq: Three times a day (TID) | ORAL | Status: DC
  Administered 2018-02-08 – 2018-02-10 (×6): 4000 mg via ORAL
  Filled 2018-02-07 (×7): qty 5

## 2018-02-07 MED ORDER — RENA-VITE PO TABS
1.0000 | ORAL_TABLET | Freq: Every day | ORAL | Status: DC
  Administered 2018-02-07 – 2018-02-09 (×3): 1 via ORAL
  Filled 2018-02-07 (×3): qty 1

## 2018-02-07 MED ORDER — HYDRALAZINE HCL 20 MG/ML IJ SOLN: 5.0000 mg | INTRAMUSCULAR | Status: DC | PRN

## 2018-02-07 MED ORDER — MORPHINE SULFATE (PF) 2 MG/ML IV SOLN
2.0000 mg | INTRAVENOUS | Status: DC | PRN
Start: 2018-02-07 — End: 2018-02-10
  Administered 2018-02-07 – 2018-02-10 (×8): 2 mg via INTRAVENOUS
  Filled 2018-02-07 (×8): qty 1

## 2018-02-07 MED ORDER — IOPAMIDOL (ISOVUE-300) INJECTION 61%
INTRAVENOUS | Status: AC
  Administered 2018-02-07: 15 mL
  Filled 2018-02-07: qty 50

## 2018-02-07 MED ORDER — ESCITALOPRAM OXALATE 10 MG PO TABS
10.0000 mg | ORAL_TABLET | Freq: Every day | ORAL | Status: DC
  Administered 2018-02-07 – 2018-02-10 (×4): 10 mg via ORAL
  Filled 2018-02-07 (×5): qty 1

## 2018-02-07 MED ORDER — LIDOCAINE HCL 1 % IJ SOLN
INTRAMUSCULAR | Status: DC | PRN
  Administered 2018-02-07: 10 mL

## 2018-02-07 MED ORDER — CAMPHOR-MENTHOL 0.5-0.5 % EX LOTN: TOPICAL_LOTION | CUTANEOUS | Status: DC | PRN

## 2018-02-07 NOTE — Progress Notes (Signed)
V/O discontinue IV fluids.
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
## 2018-02-07 NOTE — Progress Notes (Signed)
Patient seen and examined, admitted earlier this morning by Dr.Niu Mr. Prisk is a complex patient with history of intracranial hemorrhage/CVA with left hemiplegia, ESRD on hemodialysis, diabetes, gout, RA, polysubstance abuse, was hospitalized with septic shock/Escherichia coli bacteremia and acute cholecystitis from 1/6 to 2/10, he was not felt to be a surgical candidate and underwent percutaneous cholecystostomy drain by interventional radiology on 2/4. -He went to rehabilitation in [place], [place] on 2/10. -Presented to a local emergency room last night with right upper quadrant abdominal pain fever or chills, nausea and vomiting for 3 days, he also reported that his drain had no output for about a week. -Apparently had a CT scan at emergency room in [place], which reportedly showed gallstones and question colitis. -We do not have this CAT scan report and have requested it  1. Chronic cholecystitis/fever and right upper quadrant pain -not a surgical candidate per [facility] last month -Status post percutaneous drain on 2/4, now with likely clogged drain, likely etiology of his fever and symptoms -I have requested a CT scan report from outside hospital -Interventional radiology consulted to evaluate drain -Continue empiric Rocephin and Flagyl for now -Stop IV fluids  2. History of CVA/intracerebral hemorrhage/left hemiplegia -Continue aspirin  3. ESRD on hemodialysis -Renal consulted for HD today  4. Polysubstance abuse -Most recently in rehabilitation for a month now so less likely to have access to alcohol or drugs  5. Anemia of chronic disease -Monitor  6. Hypertension -Continue amlodipine  7 history of gout -Continue colchicine  Zannie Cove, MD

## 2018-02-07 NOTE — Progress Notes (Signed)
IR called will come to get patient about 1 hour for drain.

## 2018-02-07 NOTE — Progress Notes (Signed)
Pt gone down for dialysis.
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
## 2018-02-07 NOTE — Progress Notes (Signed)
Pt gone to IR 

## 2018-02-08 DIAGNOSIS — K811 Chronic cholecystitis: Secondary | ICD-10-CM

## 2018-02-08 MED ORDER — COLCHICINE 0.6 MG PO TABS
0.3000 mg | ORAL_TABLET | Freq: Every day | ORAL | Status: DC
  Administered 2018-02-09 – 2018-02-10 (×2): 0.3 mg via ORAL
  Filled 2018-02-08 (×2): qty 0.5

## 2018-02-08 MED ORDER — DARBEPOETIN ALFA 100 MCG/0.5ML IJ SOSY
100.0000 ug | PREFILLED_SYRINGE | INTRAMUSCULAR | Status: DC
  Administered 2018-02-10: 100 ug via INTRAVENOUS

## 2018-02-08 MED ORDER — HEPARIN SODIUM (PORCINE) 5000 UNIT/ML IJ SOLN
5000.0000 [IU] | Freq: Three times a day (TID) | INTRAMUSCULAR | Status: DC
Start: 2018-02-08 — End: 2018-02-10
  Administered 2018-02-08 – 2018-02-09 (×4): 5000 [IU] via SUBCUTANEOUS
  Filled 2018-02-08 (×5): qty 1

## 2018-02-08 NOTE — Progress Notes (Signed)
PROGRESS NOTE    Wesley Burns  JKK:[id number] DOB: 11/23/1966 DOA: 02/07/2018 PCP: Darrow Bussing, MD  Brief Narrative: Wesley Burns is a complex patient with history of intracranial hemorrhage/CVA with left hemiplegia, ESRD on hemodialysis, diabetes, gout, RA, polysubstance abuse, was hospitalized with septic shock/Escherichia coli bacteremia and acute cholecystitis from 1/6 to 2/10, he was not felt to be a surgical candidate and underwent percutaneous cholecystostomy drain by interventional radiology on 2/4. -He went to rehabilitation in [place], [place] on 2/10. -Presented to a local emergency room last night with right upper quadrant abdominal pain fever or chills, nausea and vomiting for 3 days, he also reported that his drain had no output for about a week. -Apparently had a CT scan at emergency room in [place], which reportedly showed gallstones and question colitis.  Assessment & Plan:   1. Chronic cholecystitis/fever and right upper quadrant pain/malposition of gallbladder drain -not a surgical candidate per [facility] last month -Status post percutaneous drain on 2/4, appreciate interventional radiology input by Dr. Deanne Coffer yesterday , he underwent a drain study was noted that he had malpositioning of his drain which was outside his gallbladder this was exchanged and had a new drain placed in the lumen of the gallbladder yesterday 3/9  -This was the most likely etiology of his symptoms and fever  -Clinically improving now, I'm still awaiting outside hospital CT scan from [facility] -Supportive care, advance diet as tolerated, continue IV antibiotics 1 more day -Transition to oral antibiotics tomorrow and will discuss with general surgery regarding timeline for cholecystectomy  2. History of CVA/intracerebral hemorrhage/left hemiplegia -Continue aspirin  3. ESRD on hemodialysis -Renal consulted for HD  4. Polysubstance abuse -Most recently in rehabilitation for a month now so  less likely to have access to alcohol or drugs  5. Anemia of chronic disease -Monitor  6. Hypertension -Continue amlodipine  7 history of gout -Continue colchicine  DVT prophylaxis: Heparin subcutaneous Code Status: Full code Family Communication: No family at bedside Disposition Plan: SNF in 1-2 days if stable  Consultants:   Interventional radiology  Procedures: Malposition of previously placed cholecystostomy catheter, with  successful exchange and repositioning within the gallbladder lumen  Antimicrobials:    Subjective: -Feels better today, underwent strain exchange, able to tolerate by mouth diet this morning  Objective: Vitals:   02/07/18 1726 02/07/18 1826 02/07/18 2045 02/08/18 0645  BP: (!) 161/86 (!) 181/83 (!) 184/79 (!) 155/69  Pulse: (!) 102 99 95 77  Resp: 19 18 18 18   Temp: 98.2 F (36.8 C) 98.6 F (37 C) 99.4 F (37.4 C) 99 F (37.2 C)  TempSrc: Oral Oral Oral Oral  SpO2: 98% 92% 93% 95%  Weight: 63.6 kg (140 lb 3.4 oz)  62.6 kg (138 lb 0.1 oz)     Intake/Output Summary (Last 24 hours) at 02/08/2018 1109 Last data filed at 02/08/2018 0600 Gross per 24 hour  Intake 240 ml  Output 3050 ml  Net -2810 ml   Filed Weights   02/07/18 1316 02/07/18 1726 02/07/18 2045  Weight: 66.1 kg (145 lb 11.6 oz) 63.6 kg (140 lb 3.4 oz) 62.6 kg (138 lb 0.1 oz)    Examination:  Gen: Awake, Alert, Oriented X 3, chronically ill-appearing, in no distress HEENT: PERRLA, Neck supple, no JVD Lungs: Improved air movement, decreased at the right base CVS: RRR,No Gallops,Rubs or new Murmurs Abd: soft, Non tender, non distended, BS present, gallbladder drain noted  Extremities: Trace edema  Skin: no new rashes  neuro:  Chronic left hemiplegia   Data Reviewed:   CBC: Recent Labs  Lab 02/07/18 0353  WBC 7.1  HGB 8.5*  HCT 26.1*  MCV 82.6  PLT 194   Basic Metabolic Panel: Recent Labs  Lab 02/07/18 0353  NA 135  K 4.2  CL 99*  CO2 24  GLUCOSE 91    BUN 21*  CREATININE 6.11*  CALCIUM 8.5*   GFR: Estimated Creatinine Clearance: 12.4 mL/min (A) (by C-G formula based on SCr of 6.11 mg/dL (H)). Liver Function Tests: Recent Labs  Lab 02/07/18 0353  AST 22  ALT 17  ALKPHOS 279*  BILITOT 0.9  PROT 6.2*  ALBUMIN 2.8*   No results for input(s): LIPASE, AMYLASE in the last 168 hours. No results for input(s): AMMONIA in the last 168 hours. Coagulation Profile: Recent Labs  Lab 02/07/18 0353  INR 1.03   Cardiac Enzymes: No results for input(s): CKTOTAL, CKMB, CKMBINDEX, TROPONINI in the last 168 hours. BNP (last 3 results) No results for input(s): PROBNP in the last 8760 hours. HbA1C: No results for input(s): HGBA1C in the last 72 hours. CBG: Recent Labs  Lab 02/07/18 0247  GLUCAP 91   Lipid Profile: No results for input(s): CHOL, HDL, LDLCALC, TRIG, CHOLHDL, LDLDIRECT in the last 72 hours. Thyroid Function Tests: No results for input(s): TSH, T4TOTAL, FREET4, T3FREE, THYROIDAB in the last 72 hours. Anemia Panel: No results for input(s): VITAMINB12, FOLATE, FERRITIN, TIBC, IRON, RETICCTPCT in the last 72 hours. Urine analysis:    Component Value Date/Time   COLORURINE AMBER (A) 01/03/2018 1228   APPEARANCEUR HAZY (A) 01/03/2018 1228   LABSPEC 1.016 01/03/2018 1228   PHURINE 9.0 (H) 01/03/2018 1228   GLUCOSEU 150 (A) 01/03/2018 1228   HGBUR NEGATIVE 01/03/2018 1228   BILIRUBINUR NEGATIVE 01/03/2018 1228   BILIRUBINUR neg 08/11/2013 1646   KETONESUR 5 (A) 01/03/2018 1228   PROTEINUR >=300 (A) 01/03/2018 1228   UROBILINOGEN 0.2 09/30/2015 1203   NITRITE NEGATIVE 01/03/2018 1228   LEUKOCYTESUR NEGATIVE 01/03/2018 1228   Sepsis Labs: @LABRCNTIP (procalcitonin:4,lacticidven:4)  ) Recent Results (from the past 240 hour(s))  Surgical pcr screen     Status: None   Collection Time: 02/07/18  4:17 AM  Result Value Ref Range Status   MRSA, PCR NEGATIVE NEGATIVE Final   Staphylococcus aureus NEGATIVE NEGATIVE Final     Comment: (NOTE) The Xpert SA Assay (FDA approved for NASAL specimens in patients 64 years of age and older), is one component of a comprehensive surveillance program. It is not intended to diagnose infection nor to guide or monitor treatment. Performed at [facility], [address], [place], [place] [address]          Radiology Studies: Ir Exchange Biliary Drain  Result Date: 02/07/2018 INDICATION: Chronic cholecystitis. Percutaneous cholecystostomy catheter placed 01/05/2018. No longer draining despite flushes. EXAM: EXCHANGE OF CHOLECYSTOSTOMY CATHETER UNDER FLUOROSCOPY MEDICATIONS: None indicated ANESTHESIA/SEDATION: Lidocaine 1% subcutaneous 10 mL PROCEDURE: Informed written consent was obtained from the patient after a thorough discussion of the procedural risks, benefits and alternatives. All questions were addressed. Maximal Sterile Barrier Technique was utilized including caps, mask, sterile gowns, sterile gloves, sterile drape, hand hygiene and skin antiseptic. A timeout was performed prior to the initiation of the procedure. Scout radiograph demonstrated partial unfolding of the pigtail component of the drain catheter. Contrast injection under fluoroscopy demonstrated that the proximal side holes lie external to the gallbladder. There is partial opacification of the lumen of the gallbladder, and some free flow of  contrast over the liver. The catheter was cut and exchanged carefully over an Amplatz guidewire under fluoroscopy for a new 10 French pigtail catheter, formed centrally within the gallbladder. Contrast injection confirmed good position within the gallbladder. Multiple filling defects consistent with calculi. No extravasation. Cystic duct and common duct are not opacified. The catheter was secured externally 0 Prolene suture and placed to gravity drainage. The patient tolerated the procedure well. FLUOROSCOPY TIME:  0.8 minutes; 123 uGym2 DAP COMPLICATIONS: None  immediate. IMPRESSION: 1. Malposition of previously placed cholecystostomy catheter, with successful exchange and repositioning within the gallbladder lumen. Electronically Signed   By: Corlis Leak M.D.   On: 02/07/2018 13:15        Scheduled Meds: . amLODipine  10 mg Oral Daily  . aspirin EC  81 mg Oral Daily  . colchicine  0.6 mg Oral Daily  . doxercalciferol  2 mcg Intravenous Q T,Th,Sa-HD  . escitalopram  10 mg Oral Daily  . feeding supplement (NEPRO CARB STEADY)  237 mL Oral TID AC  . multivitamin  1 tablet Oral QHS  . nicotine  21 mg Transdermal Daily  . pantoprazole  40 mg Oral Daily  . polyethylene glycol  17 g Oral BID  . sevelamer carbonate  1,600 mg Oral BID BM  . sevelamer carbonate  4,000 mg Oral TID WC   Continuous Infusions: . cefTRIAXone (ROCEPHIN)  IV 2 g (02/08/18 0443)  . metronidazole 500 mg (02/08/18 0521)     LOS: 1 day    Time spent:    Zannie Cove, MD [facility] Page via [email or web address], password [email or web address] After 7PM please contact night-coverage  02/08/2018, 11:09 AM

## 2018-02-08 NOTE — Progress Notes (Addendum)
[facility] Progress Note   Subjective:   "I am feeling much better today." No new complaints.  Objective Vitals:   02/07/18 1726 02/07/18 1826 02/07/18 2045 02/08/18 0645  BP: (!) 161/86 (!) 181/83 (!) 184/79 (!) 155/69  Pulse: (!) 102 99 95 77  Resp: 19 18 18 18   Temp: 98.2 F (36.8 C) 98.6 F (37 C) 99.4 F (37.4 C) 99 F (37.2 C)  TempSrc: Oral Oral Oral Oral  SpO2: 98% 92% 93% 95%  Weight: 63.6 kg (140 lb 3.4 oz)  62.6 kg (138 lb 0.1 oz)    Physical Exam General:NAD, pale male, L sided weakness Heart:RRR Lungs:CTAB Abdomen:soft, NTND, drain in RUQ w/clear yellow brown drainage Extremities:no edema Dialysis Access: LU AVF, +b/t    Filed Weights   02/07/18 1316 02/07/18 1726 02/07/18 2045  Weight: 66.1 kg (145 lb 11.6 oz) 63.6 kg (140 lb 3.4 oz) 62.6 kg (138 lb 0.1 oz)    Intake/Output Summary (Last 24 hours) at 02/08/2018 1154 Last data filed at 02/08/2018 0600 Gross per 24 hour  Intake 240 ml  Output 3050 ml  Net -2810 ml    Additional Objective Labs: Basic Metabolic Panel: Recent Labs  Lab 02/07/18 0353  NA 135  K 4.2  CL 99*  CO2 24  GLUCOSE 91  BUN 21*  CREATININE 6.11*  CALCIUM 8.5*   Liver Function Tests: Recent Labs  Lab 02/07/18 0353  AST 22  ALT 17  ALKPHOS 279*  BILITOT 0.9  PROT 6.2*  ALBUMIN 2.8*   CBC: Recent Labs  Lab 02/07/18 0353  WBC 7.1  HGB 8.5*  HCT 26.1*  MCV 82.6  PLT 194    CBG: Recent Labs  Lab 02/07/18 0247  GLUCAP 91    Lab Results  Component Value Date   INR 1.03 02/07/2018   INR 1.13 01/04/2018   INR 0.93 12/07/2017   Studies/Results: Ir Exchange Biliary Drain  Result Date: 02/07/2018 INDICATION: Chronic cholecystitis. Percutaneous cholecystostomy catheter placed 01/05/2018. No longer draining despite flushes. EXAM: EXCHANGE OF CHOLECYSTOSTOMY CATHETER UNDER FLUOROSCOPY MEDICATIONS: None indicated ANESTHESIA/SEDATION: Lidocaine 1% subcutaneous 10 mL PROCEDURE: Informed written  consent was obtained from the patient after a thorough discussion of the procedural risks, benefits and alternatives. All questions were addressed. Maximal Sterile Barrier Technique was utilized including caps, mask, sterile gowns, sterile gloves, sterile drape, hand hygiene and skin antiseptic. A timeout was performed prior to the initiation of the procedure. Scout radiograph demonstrated partial unfolding of the pigtail component of the drain catheter. Contrast injection under fluoroscopy demonstrated that the proximal side holes lie external to the gallbladder. There is partial opacification of the lumen of the gallbladder, and some free flow of contrast over the liver. The catheter was cut and exchanged carefully over an Amplatz guidewire under fluoroscopy for a new 10 French pigtail catheter, formed centrally within the gallbladder. Contrast injection confirmed good position within the gallbladder. Multiple filling defects consistent with calculi. No extravasation. Cystic duct and common duct are not opacified. The catheter was secured externally 0 Prolene suture and placed to gravity drainage. The patient tolerated the procedure well. FLUOROSCOPY TIME:  0.8 minutes; 123 uGym2 DAP COMPLICATIONS: None immediate. IMPRESSION: 1. Malposition of previously placed cholecystostomy catheter, with successful exchange and repositioning within the gallbladder lumen. Electronically Signed   By: 03/05/2018 M.D.   On: 02/07/2018 13:15    Medications: . cefTRIAXone (ROCEPHIN)  IV 2 g (02/08/18 0443)  . metronidazole 500 mg (02/08/18 0521)   .  amLODipine  10 mg Oral Daily  . aspirin EC  81 mg Oral Daily  . colchicine  0.6 mg Oral Daily  . doxercalciferol  2 mcg Intravenous Q T,Th,Sa-HD  . escitalopram  10 mg Oral Daily  . feeding supplement (NEPRO CARB STEADY)  237 mL Oral TID AC  . heparin injection (subcutaneous)  5,000 Units Subcutaneous Q8H  . multivitamin  1 tablet Oral QHS  . nicotine  21 mg Transdermal  Daily  . pantoprazole  40 mg Oral Daily  . polyethylene glycol  17 g Oral BID  . sevelamer carbonate  1,600 mg Oral BID BM  . sevelamer carbonate  4,000 mg Oral TID WC    Dialysis Orders: TTS - [facility]  4hrs, BFR 400, DFR 800,  EDW 65kg, 2K/ 2.5Ca   Access: LU AVF  Heparin 2000 Unit bolus IV qHD Hectorol IV qHD renvela 800mg  -5AC TID, 2w/snacks   Assessment/Plan: 1.  Cholecystitis - s/p perc drain by IR 2/4. IR consulted - drain exchanged 3/9 by Dr. 5/9. 2.  ESRD -  HD yesterday, tolerated well. Continue per regular schedule while admitted.   3.  Hypertension/volume  - BP remains slightly elevated. Continue OP meds.  Titrate down volume as tolerated. If weights correct, under EDW, will likely need new EDW at d/c.  Ordered hoyer weight.  4.  Anemia  - Hgb 8.5, no ESA as OP, start aranesp Deanne Coffer IV q2wks next HD.  5.  Secondary Hyperparathyroidism -  Cor Ca 9.8, P 9.1 as OP. Continue binders, VDRA.  6.  Nutrition - Alb 2.8, renavite, nepro, and Renal diet with fluid restrictions 7. 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 DM 9. H/o ETOH abuse 10. Gout - on colchicine  , PA-C [facility] Pager: [phone] 02/08/2018,11:54 AM  LOS: 1 day   Pt seen, examined and agree w A/P as above. Drain removed/ replaced by IR yesterday, feeling better today.  Plan HD TTS.  04/10/2018 MD Vinson Moselle pager [phone]   02/08/2018, 3:42 PM

## 2018-02-09 ENCOUNTER — Inpatient Hospital Stay (HOSPITAL_COMMUNITY): Payer: Self-pay

## 2018-02-09 DIAGNOSIS — K819 Cholecystitis, unspecified: Secondary | ICD-10-CM

## 2018-02-09 LAB — COMPREHENSIVE METABOLIC PANEL
ALBUMIN: 2.8 g/dL — AB (ref 3.5–5.0)
ALT: 13 U/L — AB (ref 17–63)
AST: 18 U/L (ref 15–41)
Alkaline Phosphatase: 228 U/L — ABNORMAL HIGH (ref 38–126)
Anion gap: 14 (ref 5–15)
BUN: 14 mg/dL (ref 6–20)
CHLORIDE: 95 mmol/L — AB (ref 101–111)
CO2: 26 mmol/L (ref 22–32)
CREATININE: 4.78 mg/dL — AB (ref 0.61–1.24)
Calcium: 8.4 mg/dL — ABNORMAL LOW (ref 8.9–10.3)
GFR calc non Af Amer: 13 mL/min — ABNORMAL LOW (ref >60)
GFR, EST AFRICAN AMERICAN: 15 mL/min — AB (ref >60)
GLUCOSE: 90 mg/dL (ref 65–99)
Potassium: 3.1 mmol/L — ABNORMAL LOW (ref 3.5–5.1)
SODIUM: 135 mmol/L (ref 135–145)
Total Bilirubin: 0.8 mg/dL (ref 0.3–1.2)
Total Protein: 6 g/dL — ABNORMAL LOW (ref 6.5–8.1)

## 2018-02-09 LAB — CBC
HCT: 26.3 % — ABNORMAL LOW (ref 39.0–52.0)
Hemoglobin: 8.2 g/dL — ABNORMAL LOW (ref 13.0–17.0)
MCH: 26.1 pg (ref 26.0–34.0)
MCHC: 31.2 g/dL (ref 30.0–36.0)
MCV: 83.8 fL (ref 78.0–100.0)
PLATELETS: 207 10*3/uL (ref 150–400)
RBC: 3.14 MIL/uL — AB (ref 4.22–5.81)
RDW: 19.1 % — ABNORMAL HIGH (ref 11.5–15.5)
WBC: 6.6 10*3/uL (ref 4.0–10.5)

## 2018-02-09 MED ORDER — OXYCODONE-ACETAMINOPHEN 5-325 MG PO TABS: 1.0000 | ORAL_TABLET | Freq: Four times a day (QID) | ORAL | 0 refills | Status: DC | PRN

## 2018-02-09 MED ORDER — METRONIDAZOLE 250 MG PO TABS: 250.0000 mg | ORAL_TABLET | Freq: Three times a day (TID) | ORAL | Status: DC

## 2018-02-09 MED ORDER — CIPROFLOXACIN HCL 250 MG PO TABS
250.0000 mg | ORAL_TABLET | Freq: Every day | ORAL | Status: DC
  Administered 2018-02-09 – 2018-02-10 (×2): 250 mg via ORAL
  Filled 2018-02-09 (×3): qty 1

## 2018-02-09 MED ORDER — CIPROFLOXACIN HCL 250 MG PO TABS: 250.0000 mg | ORAL_TABLET | Freq: Every day | ORAL | 0 refills | Status: DC

## 2018-02-09 MED ORDER — BOOST / RESOURCE BREEZE PO LIQD CUSTOM
1.0000 | Freq: Three times a day (TID) | ORAL | Status: DC
  Administered 2018-02-09 – 2018-02-10 (×3): 1 via ORAL
  Filled 2018-02-09 (×7): qty 1

## 2018-02-09 MED ORDER — METRONIDAZOLE 500 MG PO TABS
250.0000 mg | ORAL_TABLET | Freq: Three times a day (TID) | ORAL | Status: DC
  Administered 2018-02-09 – 2018-02-10 (×3): 250 mg via ORAL
  Filled 2018-02-09 (×3): qty 1

## 2018-02-09 MED ORDER — COLCHICINE 0.6 MG PO TABS: 0.3000 mg | ORAL_TABLET | Freq: Every day | ORAL | Status: DC

## 2018-02-09 NOTE — Plan of Care (Signed)
  Progressing Education: Knowledge of General Education information will improve 02/09/2018 1902 - Progressing by Cordie Grice, RN Health Behavior/Discharge Planning: Ability to manage health-related needs will improve 02/09/2018 1902 - Progressing by Cordie Grice, RN Clinical Measurements: Ability to maintain clinical measurements within normal limits will improve 02/09/2018 1902 - Progressing by Cordie Grice, RN Will remain free from infection 02/09/2018 1902 - Progressing by Cordie Grice, RN Diagnostic test results will improve 02/09/2018 1902 - Progressing by Cordie Grice, RN Respiratory complications will improve 02/09/2018 1902 - Progressing by Cordie Grice, RN Cardiovascular complication will be avoided 02/09/2018 1902 - Progressing by Cordie Grice, RN Activity: Risk for activity intolerance will decrease 02/09/2018 1902 - Progressing by Cordie Grice, RN Nutrition: Adequate nutrition will be maintained 02/09/2018 1902 - Progressing by Cordie Grice, RN Coping: Level of anxiety will decrease 02/09/2018 1902 - Progressing by Cordie Grice, RN Elimination: Will not experience complications related to bowel motility 02/09/2018 1902 - Progressing by Cordie Grice, RN Will not experience complications related to urinary retention 02/09/2018 1902 - Progressing by Cordie Grice, RN Pain Managment: General experience of comfort will improve 02/09/2018 1902 - Progressing by Cordie Grice, RN Safety: Ability to remain free from injury will improve 02/09/2018 1902 - Progressing by Cordie Grice, RN Skin Integrity: Risk for impaired skin integrity will decrease 02/09/2018 1902 - Progressing by Cordie Grice, RN Clinical Measurements: Postoperative complications will be avoided or minimized 02/09/2018 1902 - Progressing by Cordie Grice, RN Skin Integrity: Demonstration of wound healing without infection  will improve 02/09/2018 1902 - Progressing by Cordie Grice, RN

## 2018-02-09 NOTE — Discharge Summary (Signed)
Physician Discharge Summary  Wesley Burns JJH:[id number] DOB: 09/15/1966 DOA: 02/07/2018  PCP: Wesley Bussing, MD  Admit date: 02/07/2018 Discharge date: 02/09/2018  Time spent: 45 minutes  Recommendations for Outpatient Follow-up:  1. SNF for rehabilitation 2. Drain care, drain to gravity and flush with saline twice a day 3. Interventional radiology in 4-5 weeks 4. General surgery Dr. Luisa [facility] in 4-5 weeks for evaluation for interval cholecystectomy   Discharge Diagnoses:  Principal Problem:   Cholecystitis Active Problems:   Essential hypertension   Alcohol abuse   Intracerebral hemorrhage (HCC)   Normochromic normocytic anemia   Tobacco abuse   Left hemiparesis (HCC)   Type II diabetes mellitus with renal manifestations (HCC)   ESRD on dialysis [facility])   History of CVA with residual deficit   Gout   Discharge Condition: Stable  Diet recommendation: Renal diabetic diet  Filed Weights   02/07/18 1726 02/07/18 2045 02/08/18 2019  Weight: 63.6 kg (140 lb 3.4 oz) 62.6 kg (138 lb 0.1 oz) 62.6 kg (138 lb 0.1 oz)    History of present illness:   [name] is a complex patient with history of intracranial hemorrhage/CVA with left hemiplegia, ESRD on hemodialysis, diabetes, gout,RA, polysubstance abuse,was hospitalized with septic shock/Escherichia coli bacteremia and acute cholecystitis from 1/6to2/10,he was not felt to be a surgical candidate and underwent percutaneous cholecystostomy drain by interventional radiology on 2/4. -He went to rehabilitation [facility] 2/[facility]. -Presented to a local emergency room last night with right upper quadrant abdominal pain fever or chills, nausea and vomiting for 3 days  Hospital Course:  1.Chronic cholecystitis/fever and right upper quadrant pain -due to malposition of gallbladder drain -Admitted last month with acute Cholecystitis and she was not felt to be a surgical candidate, underwent percutaneous  gallbladder drain by interventional radiology on 2/4. -This drain got dislodged during his rehabilitation stay this past week and subsequently was admitted with abdominal pain in the right upper quadrant associated with nausea vomiting fevers and chills -He was started on broad-spectrum IV antibiotics, interventional radiology was consulted, drain study showed malpositioned drain which was now situated outside his gallbladder lumen, this was exchanged and a new drain was placed in the lumen of the gallbladder on Saturday 3/9. -Clinically improved and stable since then, diet advanced and tolerating well, transitioned over to oral antibiotics -I called and discussed with general surgeon on call Dr.Tsuei, recommended follow-up with general surgery in 4-5 weeks, for evaluation for interval cholecystectomy -He also needs follow-up in the drain clinic with interventional radiology, office will call with follow-up -Discharge to SNF on ciprofloxacin and Flagyl for 3 more days  2.History of CVA/intracerebral hemorrhage/left hemiplegia -Continue aspirin  3.ESRD on hemodialysis -Followed by renal inpatient for dialysis  4.Polysubstance abuse -Most recently in rehabilitation for a month now solesslikely to have access to alcohol or drugs  5.Anemia of chronic disease -stable, continue Aranesp and Iron with HD  6.Hypertension -Continue amlodipine  7history of gout -Continuecolchicine  Diabetes mellitus -Diet controlled, sliding scale insulin used in patient  Consultants:   Interventional radiology  Renal  Procedures: Malposition of previously placed cholecystostomy catheter, with  successful exchange and repositioning within the gallbladder lumen     Discharge Exam: Vitals:   02/09/18 0441 02/09/18 0900  BP: (!) 163/92 (!) 158/88  Pulse: 77 80  Resp: 18 18  Temp: 98.6 F (37 C) 98.3 F (36.8 C)  SpO2: 91% 94%    General: AAOx3 Cardiovascular:  S1S2/RRR Respiratory: CTAB  Discharge Instructions   Discharge Instructions    Discharge instructions   Complete by:  As directed    Renal Diet   Increase activity slowly   Complete by:  As directed      Allergies as of 02/09/2018      Reactions   Prednisone Hives, Nausea And Vomiting, Swelling   Joint swelling, also   Zosyn [piperacillin Sod-tazobactam So]    Hot flash and shortness of breath   Lactose Intolerance (gi) Diarrhea   No dairy   Adhesive [tape] Rash      Medication List    STOP taking these medications   camphor-menthol lotion Commonly known as:  SARNA   RESOURCE THICKENUP CLEAR Powd   sodium chloride 1 g tablet     TAKE these medications   amLODipine 10 MG tablet Commonly known as:  NORVASC Take 1 tablet (10 mg total) by mouth daily.   aspirin 81 MG EC tablet Take 1 tablet (81 mg total) by mouth daily.   benzonatate 100 MG capsule Commonly known as:  TESSALON Take 1 capsule (100 mg total) by mouth 3 (three) times daily as needed for cough.   calcitRIOL 0.25 MCG capsule Commonly known as:  ROCALTROL Take 3 capsules (0.75 mcg total) by mouth 3 (three) times a week. What changed:    when to take this  additional instructions   calcium carbonate 500 MG chewable tablet Commonly known as:  TUMS - dosed in mg elemental calcium Chew 1 tablet (200 mg of elemental calcium total) by mouth daily.   ciprofloxacin 250 MG tablet Commonly known as:  CIPRO Take 1 tablet (250 mg total) by mouth daily. For 3days   colchicine 0.6 MG tablet Take 0.5 tablets (0.3 mg total) by mouth daily. What changed:  how much to take   escitalopram 10 MG tablet Commonly known as:  LEXAPRO Take 1 tablet (10 mg total) by mouth daily.   feeding supplement (NEPRO CARB STEADY) Liqd Take 237 mLs by mouth 2 (two) times daily between meals.   lanthanum 1000 MG chewable tablet Commonly known as:  FOSRENOL Chew 1 tablet (1,000 mg total) by mouth 3 (three) times daily with  meals.   lidocaine-prilocaine cream Commonly known as:  EMLA APPLY TO ACCESS PREDIALYSIS   metroNIDAZOLE 250 MG tablet Commonly known as:  FLAGYL Take 1 tablet (250 mg total) by mouth every 8 (eight) hours. For 3days   multivitamin Tabs tablet Take 1 tablet by mouth at bedtime.   oxyCODONE-acetaminophen 5-325 MG tablet Commonly known as:  PERCOCET Take 1 tablet by mouth every 6 (six) hours as needed for severe pain.   pantoprazole 40 MG tablet Commonly known as:  PROTONIX Take 1 tablet (40 mg total) by mouth daily.   polyethylene glycol packet Commonly known as:  MIRALAX / GLYCOLAX Take 17 g by mouth 2 (two) times daily.   tiZANidine 2 MG tablet Commonly known as:  ZANAFLEX Take 1 tablet (2 mg total) by mouth every 8 (eight) hours as needed for muscle spasms.   traZODone 50 MG tablet Commonly known as:  DESYREL Take 1 tablet (50 mg total) by mouth at bedtime as needed for sleep.      Allergies  Allergen Reactions  . Prednisone Hives, Nausea And Vomiting and Swelling    Joint swelling, also  . Zosyn [Piperacillin Sod-Tazobactam So]     Hot flash and shortness of breath  . Lactose Intolerance (Gi) Diarrhea    No dairy  . Adhesive [Tape]  Rash   Follow-up Information    Koirala, Dibas, MD. Schedule an appointment as soon as possible for a visit in 1 week(s).   Specialty:  Family Medicine Contact information: [address] [place] [address] [phone]        General Surgery. Schedule an appointment as soon as possible for a visit in 5 week(s).            The results of significant diagnostics from this hospitalization (including imaging, microbiology, ancillary and laboratory) are listed below for reference.    Significant Diagnostic Studies: Ir Exchange Biliary Drain  Result Date: 02/07/2018 INDICATION: Chronic cholecystitis. Percutaneous cholecystostomy catheter placed 01/05/2018. No longer draining despite flushes. EXAM:  EXCHANGE OF CHOLECYSTOSTOMY CATHETER UNDER FLUOROSCOPY MEDICATIONS: None indicated ANESTHESIA/SEDATION: Lidocaine 1% subcutaneous 10 mL PROCEDURE: Informed written consent was obtained from the patient after a thorough discussion of the procedural risks, benefits and alternatives. All questions were addressed. Maximal Sterile Barrier Technique was utilized including caps, mask, sterile gowns, sterile gloves, sterile drape, hand hygiene and skin antiseptic. A timeout was performed prior to the initiation of the procedure. Scout radiograph demonstrated partial unfolding of the pigtail component of the drain catheter. Contrast injection under fluoroscopy demonstrated that the proximal side holes lie external to the gallbladder. There is partial opacification of the lumen of the gallbladder, and some free flow of contrast over the liver. The catheter was cut and exchanged carefully over an Amplatz guidewire under fluoroscopy for a new 10 French pigtail catheter, formed centrally within the gallbladder. Contrast injection confirmed good position within the gallbladder. Multiple filling defects consistent with calculi. No extravasation. Cystic duct and common duct are not opacified. The catheter was secured externally 0 Prolene suture and placed to gravity drainage. The patient tolerated the procedure well. FLUOROSCOPY TIME:  0.8 minutes; 123 uGym2 DAP COMPLICATIONS: None immediate. IMPRESSION: 1. Malposition of previously placed cholecystostomy catheter, with successful exchange and repositioning within the gallbladder lumen. Electronically Signed   By: Corlis Leak M.D.   On: 02/07/2018 13:15    Microbiology: Recent Results (from the past 240 hour(s))  Culture, blood (Routine X 2) w Reflex to ID Panel     Status: None (Preliminary result)   Collection Time: 02/07/18  4:05 AM  Result Value Ref Range Status   Specimen Description BLOOD RIGHT ANTECUBITAL  Final   Special Requests   Final    BOTTLES DRAWN AEROBIC  AND ANAEROBIC Blood Culture adequate volume   Culture   Final    NO GROWTH 1 DAY Performed at [facility], [address] [place], [place] [address]    Report Status PENDING  Incomplete  Culture, blood (Routine X 2) w Reflex to ID Panel     Status: None (Preliminary result)   Collection Time: 02/07/18  4:11 AM  Result Value Ref Range Status   Specimen Description BLOOD RIGHT HAND  Final   Special Requests IN PEDIATRIC BOTTLE Blood Culture adequate volume  Final   Culture   Final    NO GROWTH 1 DAY Performed at [facility], [address] [place], [place] [address]    Report Status PENDING  Incomplete  Surgical pcr screen     Status: None   Collection Time: 02/07/18  4:17 AM  Result Value Ref Range Status   MRSA, PCR NEGATIVE NEGATIVE Final   Staphylococcus aureus NEGATIVE NEGATIVE Final    Comment: (NOTE) The Xpert SA Assay (FDA approved for NASAL specimens in patients  37 years of age and older), is one component of a comprehensive surveillance program. It is not intended to diagnose infection nor to guide or monitor treatment. Performed at [facility], [address], [place], [place] [address]      Labs: Basic Metabolic Panel: Recent Labs  Lab 02/07/18 0353 02/09/18 0659  NA 135 135  K 4.2 3.1*  CL 99* 95*  CO2 24 26  GLUCOSE 91 90  BUN 21* 14  CREATININE 6.11* 4.78*  CALCIUM 8.5* 8.4*   Liver Function Tests: Recent Labs  Lab 02/07/18 0353 02/09/18 0659  AST 22 18  ALT 17 13*  ALKPHOS 279* 228*  BILITOT 0.9 0.8  PROT 6.2* 6.0*  ALBUMIN 2.8* 2.8*   No results for input(s): LIPASE, AMYLASE in the last 168 hours. No results for input(s): AMMONIA in the last 168 hours. CBC: Recent Labs  Lab 02/07/18 0353 02/09/18 0659  WBC 7.1 6.6  HGB 8.5* 8.2*  HCT 26.1* 26.3*  MCV 82.6 83.8  PLT 194 207   Cardiac Enzymes: No results for input(s): CKTOTAL, CKMB, CKMBINDEX, TROPONINI in the last 168 hours. BNP: BNP (last 3 results) No  results for input(s): BNP in the last 8760 hours.  ProBNP (last 3 results) No results for input(s): PROBNP in the last 8760 hours.  CBG: Recent Labs  Lab 02/07/18 0247  GLUCAP 91       Signed:  Zannie Cove MD.  [facility] 02/09/2018, 11:07 AM

## 2018-02-09 NOTE — Clinical Social Work Note (Signed)
CSW advised this morning that patient ready for discharge today and arrangements made for patient to return to [facility]. CSW advised later today by patient's nurse that d/c cancelled. Bjorn Loser, admissions director with [facility] contacted and message left that d/c cancelled today. CSW will continue to follow and facilitate d/c to SNF when medically stable.  Genelle Bal, MSW, LCSW Licensed Clinical Social Worker Clinical Social Work Department [facility] [phone]

## 2018-02-09 NOTE — Clinical Social Work Note (Signed)
Clinical Social Work Assessment  Patient Details  Name: Wesley Burns MRN: [id number] Date of Birth: 22-Nov-1966  Date of referral:  02/09/18               Reason for consult:  Discharge Planning, Facility Placement                Permission sought to share information with:  Family Supports Permission granted to share information::  Yes, Verbal Permission Granted  Name::     Wesley Burns  Agency::     Relationship::  Partner  Contact Information:  [phone]  Housing/Transportation Living arrangements for the past 2 months:  Skilled Nursing Facility(Patient came from home to [facility] in February and d/c'd to [facility] 01/11/18) Source of Information:  Patient, Facility, Other (Comment Required)(Patient's chart) Patient Interpreter Needed:  None Criminal Activity/Legal Involvement Pertinent to Current Situation/Hospitalization:  No - Comment as needed Significant Relationships:  Significant Other(Phil Lair) Lives with:  Facility Resident(Currently a patient at [facility]) Do you feel safe going back to the place where you live?  No(Patient wants a facility in [place] to be closer to his partner) Need for family participation in patient care:  Yes (Comment)  Care giving concerns:  Mr. Kerrigan did not express any concerns regarding his care at [facility], however does not want to return due to the distance and not being close to his partner.  Social Worker assessment / plan:  CSW talked with Mr. Jawad at the bedside regarding his discharge plan. Patient indicated that he does not want to return to [facility] due to the distance and his desire to discharge to [facility], as they are owned by the same company and this was discussed.  Mr. Gomillion advised that clinicals will be sent to [facility] and they will be contacted, however CSW could not guarantee that they can accept him and patient expressed understanding and hopefulness that they will be able  to take him.  CSW talked with patient's partner, Wesley Burns regarding patient's discharge and returning to [facility] and the d/c was discussed, along with Mr. Lenor Derrick objections to patient returning to this facility.  Contact made with Bjorn Loser, admissions director with [facility] regarding patient returning today. She requested discharge paperwork and LOG paperwork. She will contact CSW once she receives LOG form. Bjorn Loser advised that d/c paperwork sent via [facility] and she will have her regional person send information to her from the [facility], as she does not have access yet.  Employment status:  Disabled (Comment on whether or not currently receiving Disability) Insurance information:  Self Pay (Medicaid Pending) PT Recommendations:  Not assessed at this time Information / Referral to community resources:  Other (Comment Required)(None needed as patient returning to [facility])  Patient/Family's Response to care:  Patient/partner did not express any concerns regarding his care during hospitalizations.  Patient/Family's Understanding of and Emotional Response to Diagnosis, Current Treatment, and Prognosis:  Mr. Milstein expressed understanding regarding his current medical conditions and need for nursing care before returning home. Mr. Schmutz explained that at baseline he was independent, however he has had 2 strokes which has caused him to be paralyzed on his left side, and includes losing the use of his arm and leg. Patient reported that he can feed himself with his right hand, but needs help with all other ADL's.  Emotional Assessment Appearance:  Appears stated age Attitude/Demeanor/Rapport:  Other(Appropriate) Affect (typically observed):  Appropriate Orientation:  Oriented to Self, Oriented to  Situation, Oriented to Place, Oriented to  Time Alcohol / Substance use:  Tobacco Use, Alcohol Use, Illicit Drugs(Patient smokes, drinks alcohol and uses marijuana weekly per H&P) Psych  involvement (Current and /or in the community):  No (Comment)  Discharge Needs  Concerns to be addressed:  Discharge Planning Concerns Readmission within the last 30 days:  No Current discharge risk:  None Barriers to Discharge:  Other(Awaiting a call from facility admissions director)   Cristobal Goldmann, LCSW 02/09/2018, 1:24 PM

## 2018-02-09 NOTE — NC FL2 (Signed)
[place] MEDICAID FL2 LEVEL OF CARE SCREENING TOOL     IDENTIFICATION  Patient Name: Wesley Burns Birthdate: [birth year] Sex: male Admission Date (Current Location): 02/07/2018  [facility] and [place] Number:  Haynes Bast (Pending MA number [id number] O) Facility and Address:  [facility], [address], [place], [place] [address]      Provider Number: [id number]  Attending Physician Name and Address:  Zannie Cove, MD  Relative Name and Phone Number:  Tomma Lightning; [phone]     Current Level of Care: Hospital Recommended Level of Care: Skilled Nursing Facility Prior Approval Number:    Date Approved/Denied:   PASRR Number: [id number]  Discharge Plan: SNF    Current Diagnoses: Patient Active Problem List   Diagnosis Date Noted  . Cholecystitis 02/07/2018  . Gout 02/07/2018  . E coli and enterobacter bacteremia 01/03/2018  . Cholelithiasis with cholecystitis 01/03/2018  . Septic shock (HCC) 01/03/2018  . Abdominal pain   . Acute biliary pancreatitis   . ICH (intracerebral hemorrhage) (HCC) 12/17/2017  . Dysphagia, post-stroke   . Type II diabetes mellitus with renal manifestations (HCC)   . History of tobacco abuse   . Benign essential HTN   . ESRD on dialysis (HCC)   . History of traumatic brain injury   . History of CVA with residual deficit   . History of transmetatarsal amputation of foot (HCC)   . IVH (intraventricular hemorrhage) (HCC) 12/07/2017  . Complication of vascular dialysis catheter 11/02/2016  . Lactic acidosis 11/02/2016  . Left knee pain   . Urinary retention   . Pleural effusion   . Chest pain on respiration   . Shortness of breath   . Peripheral edema   . Labile blood pressure   . Sleep disturbance   . Left hemiparesis (HCC) 07/04/2016  . Acute on chronic renal failure (HCC)   . Normochromic normocytic anemia   . DM type 2 with diabetic peripheral neuropathy (HCC)   . Hx of amputation   .  Adjustment disorder with mixed anxiety and depressed mood   . Marijuana abuse   . Acute blood loss anemia   . Anemia of chronic disease   . Bowel incontinence   . Tobacco abuse   . Low grade fever   . Hypomagnesemia   . Right knee pain   . Diarrhea   . Spasticity   . Nontraumatic acute hemorrhage of basal ganglia (HCC) 06/28/2016  . Intracerebral hemorrhage (HCC) 06/28/2016  . Cytotoxic brain edema (HCC) 06/28/2016  . Brain herniation (HCC) 06/28/2016  . Fracture, shoulder 09/04/2015  . ESRD (end stage renal disease) on dialysis (HCC)   . Alcoholic cirrhosis of liver with ascites (HCC)   . Alcohol abuse 04/24/2015  . Hypokalemia 04/21/2015  . Anasarca   . Hyponatremia   . Dislocated hip (HCC) 12/24/2013  . 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 History of total hip replacement 12/24/2013  . Proteinuria 08/31/2013  . Microalbuminuria 07/30/2013  . Charcot foot due to diabetes mellitus (HCC) 07/30/2013  . Noncompliance with medications 07/30/2013  . Essential hypertension 04/06/2012  . Essential hypertension, benign 04/06/2012    Orientation RESPIRATION BLADDER Height & Weight     Self, Time, Situation, Place  Normal Continent Weight: 138 lb 0.1 oz (62.6 kg) Height:     BEHAVIORAL SYMPTOMS/MOOD NEUROLOGICAL BOWEL NUTRITION STATUS      Continent Diet(Renal with 1200 mL fluid restriction)  AMBULATORY STATUS COMMUNICATION OF NEEDS Skin  Extensive Assist Verbally Other (Comment)(Stage 2 pressure ulcer to left foot)                       Personal Care Assistance Level of Assistance  Bathing, Feeding, Dressing Bathing Assistance: Maximum assistance Feeding assistance: Independent Dressing Assistance: Maximum assistance     Functional Limitations Info  Sight, Hearing, Speech Sight Info: Impaired(Wears glasses) Hearing Info: Adequate Speech Info: Adequate    SPECIAL CARE FACTORS FREQUENCY  PT (By licensed PT), OT (By licensed OT)      PT Frequency: (Not evaluated this hosptial stay) OT Frequency: (Not evaluated this hospital stay)            Contractures Contractures Info: Not present    Additional Factors Info  Allergies, Code Status Code Status Info: Full Allergies Info: Prednisone, Zosyn Piperacillin, Sod-tazobactam So, Lactose Intolerance, (Gi), Adhesive Tape            Current Medications (02/09/2018):  This is the current hospital active medication list Current Facility-Administered Medications  Medication Dose Route Frequency Provider Last Rate Last Dose  . acetaminophen (TYLENOL) tablet 500 mg  500 mg Oral Q6H PRN Lorretta Harp, MD      . amLODipine (NORVASC) tablet 10 mg  10 mg Oral Daily Lorretta Harp, MD   10 mg at 02/09/18 0920  . aspirin EC tablet 81 mg  81 mg Oral Daily Lorretta Harp, MD   81 mg at 02/09/18 0920  . benzonatate (TESSALON) capsule 100 mg  100 mg Oral TID PRN Lorretta Harp, MD      . camphor-menthol Wynelle Fanny) lotion   Topical PRN Lorretta Harp, MD      . colchicine tablet 0.3 mg  0.3 mg Oral Daily Zannie Cove, MD   0.3 mg at 02/09/18 0921  . [START ON 02/10/2018] Darbepoetin Alfa (ARANESP) injection 100 mcg  100 mcg Intravenous Q Tue-HD Penninger, Lindsay, PA      . doxercalciferol (HECTOROL) injection 2 mcg  2 mcg Intravenous Q T,Th,Sa-HD Penninger, Lindsay, PA   2 mcg at 02/07/18 1733  . escitalopram (LEXAPRO) tablet 10 mg  10 mg Oral Daily Lorretta Harp, MD   10 mg at 02/09/18 0920  . feeding supplement (NEPRO CARB STEADY) liquid 237 mL  237 mL Oral TID AC Penninger, Lindsay, PA   237 mL at 02/08/18 1223  . heparin injection 5,000 Units  5,000 Units Subcutaneous Q8H Zannie Cove, MD   5,000 Units at 02/09/18 [phone]  . hydrALAZINE (APRESOLINE) injection 5 mg  5 mg Intravenous Q2H PRN Lorretta Harp, MD      . lidocaine (XYLOCAINE) 1 % (with pres) injection    PRN Oley Balm, MD   10 mL at 02/07/18 1148  . metroNIDAZOLE (FLAGYL) tablet 250 mg  250 mg Oral Q8H Zannie Cove, MD      . morphine 2  MG/ML injection 2 mg  2 mg Intravenous Q4H PRN Lorretta Harp, MD   2 mg at 02/09/18 0920  . multivitamin (RENA-VIT) tablet 1 tablet  1 tablet Oral QHS Lorretta Harp, MD   1 tablet at 02/08/18 2042  . nicotine (NICODERM CQ - dosed in mg/24 hours) patch 21 mg  21 mg Transdermal Daily Lorretta Harp, MD   21 mg at 02/09/18 0920  . ondansetron (ZOFRAN) injection 4 mg  4 mg Intravenous Q8H PRN Lorretta Harp, MD   4 mg at 02/08/18 2350  . oxyCODONE (Oxy IR/ROXICODONE) immediate release tablet 5 mg  5 mg Oral Q4H  PRN Lorretta Harp, MD   5 mg at 02/07/18 2125  . pantoprazole (PROTONIX) EC tablet 40 mg  40 mg Oral Daily Lorretta Harp, MD   40 mg at 02/09/18 0920  . polyethylene glycol (MIRALAX / GLYCOLAX) packet 17 g  17 g Oral BID Lorretta Harp, MD      . sevelamer carbonate (RENVELA) tablet 1,600 mg  1,600 mg Oral BID BM Penninger, Lindsay, PA   1,600 mg at 02/08/18 1653  . sevelamer carbonate (RENVELA) tablet 4,000 mg  4,000 mg Oral TID WC Penninger, Lindsay, PA   4,000 mg at 02/09/18 0908  . tiZANidine (ZANAFLEX) tablet 2 mg  2 mg Oral Q8H PRN Lorretta Harp, MD      . traZODone (DESYREL) tablet 50 mg  50 mg Oral QHS PRN Lorretta Harp, MD      . zolpidem (AMBIEN) tablet 5 mg  5 mg Oral QHS PRN Lorretta Harp, MD         Discharge Medications: Please see discharge summary for a list of discharge medications.  Relevant Imaging Results:  Relevant Lab Results:   Additional Information ss#[id number]. Pending MA #[id number] O. Dialysis patient TTS at [facility]. Patient has a Biliary drain-inserted 3/9.  Cristobal Goldmann, LCSW

## 2018-02-09 NOTE — Progress Notes (Signed)
Called to room by pt and his partner. Pt presents a wash cloth with "red spots" all over it. States it came from his mouth. The red "spots" do not appear to this writer to be blood" inspected pts mouth and nares, no trace of blood or cuts noted. Pt then asks this writer to pull a towel from the laundry bag. States it was his towel from this morning. Towel had areas of dried blood and a few dark dry clots on it.  Asked pt where this came from he said he thought it came from his mouth when drooling in his sleep.  Notified MD. Orders recd. Hold d/c for one day

## 2018-02-09 NOTE — Progress Notes (Signed)
[facility] Progress Note   Dialysis Orders: TTS -[facility] 4hrs, BFR400, [id number], EDW 65kg,2K/2.5Ca   Access:LU AVF Heparin2000 Unit bolus IV qHD Hectorol IV qHD renvela 800mg  -5AC TID, 2w/snacks  Assessment/Plan: 1. Cholecystitis - s/p perc drain by IR 2/4. IR consulted - drain exchanged 3/9 by Dr. 5/9. On oral cipro 2. ESRD - TTS _ HD tomorrow 3. Hypertension/volume - BP remains slightly elevated. Continue OP meds.  Titrate down volume as tolerated.If weights correct, under EDW, will likely need new EDW at d/c.  Ordered hoyer weight.  4. Anemia - Hgb 8.2, no ESA as OP, start aranesp Deanne Coffer IV q2wks next HD3/12 - needs Fe studies 5. Secondary Hyperparathyroidism - . Continue binders, VDRA. 6. Nutrition - Alb 2.8, renavite, nepro, and Renal diet with fluid restrictions- intake poor 7. 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 DM 9. H/o ETOH abuse 10. Gout - on colchicine   , PA-C [facility] Beeper [phone] 02/09/2018,10:56 AM  LOS: 2 days   Subjective:   Lots of gas. Vomited yesterday so not eating much only sips of liquids.  Loose stools he thinks related to nepro  Objective Vitals:   02/08/18 1700 02/08/18 2019 02/09/18 0441 02/09/18 0900  BP: (!) 160/72 (!) 158/81 (!) 163/92 (!) 158/88  Pulse: 82 87 77 80  Resp: 18 18 18 18   Temp: 98.8 F (37.1 C) 98.8 F (37.1 C) 98.6 F (37 C) 98.3 F (36.8 C)  TempSrc: Oral Oral Oral Oral  SpO2: 98% 98% 91% 94%  Weight:  62.6 kg (138 lb 0.1 oz)     Physical Exam General: pale, chronically ill WM Heart: RRR Lungs: no rales Abdomen: soft + active BS c'tube right side Extremities: no LE edema Dialysis Access:  Left lower AVF + bruit   Additional Objective Labs: Basic Metabolic Panel: Recent Labs  Lab 02/07/18 0353 02/09/18 0659  NA 135 135  K 4.2 3.1*  CL 99* 95*  CO2 24 26  GLUCOSE 91 90  BUN 21* 14  CREATININE  6.11* 4.78*  CALCIUM 8.5* 8.4*   Liver Function Tests: Recent Labs  Lab 02/07/18 0353 02/09/18 0659  AST 22 18  ALT 17 13*  ALKPHOS 279* 228*  BILITOT 0.9 0.8  PROT 6.2* 6.0*  ALBUMIN 2.8* 2.8*   No results for input(s): LIPASE, AMYLASE in the last 168 hours. CBC: Recent Labs  Lab 02/07/18 0353 02/09/18 0659  WBC 7.1 6.6  HGB 8.5* 8.2*  HCT 26.1* 26.3*  MCV 82.6 83.8  PLT 194 207   Blood Culture    Component Value Date/Time   SDES BLOOD RIGHT HAND 02/07/2018 0411   SPECREQUEST IN PEDIATRIC BOTTLE Blood Culture adequate volume 02/07/2018 0411   CULT  02/07/2018 0411    NO GROWTH 1 DAY Performed at [facility], [address] [place], [address] [place]    REPTSTATUS PENDING 02/07/2018 0411    Cardiac Enzymes: No results for input(s): CKTOTAL, CKMB, CKMBINDEX, TROPONINI in the last 168 hours. CBG: Recent Labs  Lab 02/07/18 0247  GLUCAP 91   Iron Studies: No results for input(s): IRON, TIBC, TRANSFERRIN, FERRITIN in the last 72 hours. Lab Results  Component Value Date   INR 1.03 02/07/2018   INR 1.13 01/04/2018   INR 0.93 12/07/2017   Studies/Results: Ir Exchange Biliary Drain  Result Date: 02/07/2018 INDICATION: Chronic cholecystitis. Percutaneous cholecystostomy catheter placed 01/05/2018. No longer draining despite flushes. EXAM: EXCHANGE OF CHOLECYSTOSTOMY CATHETER  UNDER FLUOROSCOPY MEDICATIONS: None indicated ANESTHESIA/SEDATION: Lidocaine 1% subcutaneous 10 mL PROCEDURE: Informed written consent was obtained from the patient after a thorough discussion of the procedural risks, benefits and alternatives. All questions were addressed. Maximal Sterile Barrier Technique was utilized including caps, mask, sterile gowns, sterile gloves, sterile drape, hand hygiene and skin antiseptic. A timeout was performed prior to the initiation of the procedure. Scout radiograph demonstrated partial unfolding of the pigtail component of the drain catheter. Contrast  injection under fluoroscopy demonstrated that the proximal side holes lie external to the gallbladder. There is partial opacification of the lumen of the gallbladder, and some free flow of contrast over the liver. The catheter was cut and exchanged carefully over an Amplatz guidewire under fluoroscopy for a new 10 French pigtail catheter, formed centrally within the gallbladder. Contrast injection confirmed good position within the gallbladder. Multiple filling defects consistent with calculi. No extravasation. Cystic duct and common duct are not opacified. The catheter was secured externally 0 Prolene suture and placed to gravity drainage. The patient tolerated the procedure well. FLUOROSCOPY TIME:  0.8 minutes; 123 uGym2 DAP COMPLICATIONS: None immediate. IMPRESSION: 1. Malposition of previously placed cholecystostomy catheter, with successful exchange and repositioning within the gallbladder lumen. Electronically Signed   By: Corlis Leak M.D.   On: 02/07/2018 13:15   Medications:  . amLODipine  10 mg Oral Daily  . aspirin EC  81 mg Oral Daily  . ciprofloxacin  250 mg Oral Daily  . colchicine  0.3 mg Oral Daily  . [START ON 02/10/2018] darbepoetin (ARANESP) injection - DIALYSIS  100 mcg Intravenous Q Tue-HD  . doxercalciferol  2 mcg Intravenous Q T,Th,Sa-HD  . escitalopram  10 mg Oral Daily  . feeding supplement (NEPRO CARB STEADY)  237 mL Oral TID AC  . heparin injection (subcutaneous)  5,000 Units Subcutaneous Q8H  . metroNIDAZOLE  250 mg Oral Q8H  . multivitamin  1 tablet Oral QHS  . nicotine  21 mg Transdermal Daily  . pantoprazole  40 mg Oral Daily  . polyethylene glycol  17 g Oral BID  . sevelamer carbonate  1,600 mg Oral BID BM  . sevelamer carbonate  4,000 mg Oral TID WC
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
## 2018-02-10 LAB — GLUCOSE, CAPILLARY: GLUCOSE-CAPILLARY: 78 mg/dL (ref 65–99)

## 2018-02-10 MED ORDER — DOXERCALCIFEROL 4 MCG/2ML IV SOLN
INTRAVENOUS | Status: AC
  Administered 2018-02-10: 2 ug via INTRAVENOUS
  Filled 2018-02-10: qty 2

## 2018-02-10 MED ORDER — SEVELAMER CARBONATE 800 MG PO TABS: 1600.0000 mg | ORAL_TABLET | Freq: Two times a day (BID) | ORAL | Status: DC | PRN

## 2018-02-10 MED ORDER — SODIUM CHLORIDE 0.9 % IV SOLN: 100.0000 mL | INTRAVENOUS | Status: DC | PRN

## 2018-02-10 MED ORDER — HEPARIN SODIUM (PORCINE) 1000 UNIT/ML DIALYSIS: 20.0000 [IU]/kg | INTRAMUSCULAR | Status: DC | PRN

## 2018-02-10 MED ORDER — DARBEPOETIN ALFA 100 MCG/0.5ML IJ SOSY
PREFILLED_SYRINGE | INTRAMUSCULAR | Status: AC
  Administered 2018-02-10: 100 ug via INTRAVENOUS
  Filled 2018-02-10: qty 0.5

## 2018-02-10 MED ORDER — ALTEPLASE 2 MG IJ SOLR: 2.0000 mg | Freq: Once | INTRAMUSCULAR | Status: DC | PRN

## 2018-02-10 MED ORDER — LIDOCAINE HCL (PF) 1 % IJ SOLN: 5.0000 mL | INTRAMUSCULAR | Status: DC | PRN

## 2018-02-10 MED ORDER — HEPARIN SODIUM (PORCINE) 1000 UNIT/ML DIALYSIS: 1000.0000 [IU] | INTRAMUSCULAR | Status: DC | PRN

## 2018-02-10 MED ORDER — LIDOCAINE-PRILOCAINE 2.5-2.5 % EX CREA: 1.0000 "application " | TOPICAL_CREAM | CUTANEOUS | Status: DC | PRN

## 2018-02-10 MED ORDER — PENTAFLUOROPROP-TETRAFLUOROETH EX AERO: 1.0000 "application " | INHALATION_SPRAY | CUTANEOUS | Status: DC | PRN

## 2018-02-10 NOTE — Progress Notes (Signed)
Patient discharged to SNF. After visit Summary reviewed. No signs and symptoms of distress noted. Patient educated to return to the ED in the case of an emergency. Wesley Burns K [name]

## 2018-02-10 NOTE — Progress Notes (Signed)
Attempt to print AVS but was stopped with the "Legal Guardian must be notified banner". Social work spoke with significant other, Forest Gleason, to clarify whether the patient had been adjudicated in a court of law and deemed incompetent or whether he is solely the POA. Mr. Daleen Bo confirms that he is the Medical and Durable POA.  Lorine Bears, RN

## 2018-02-10 NOTE — Progress Notes (Signed)
Pt seen, no changes, DC to SNF today  Zannie Cove, MD

## 2018-02-10 NOTE — Progress Notes (Signed)
SLP Cancellation Note  Patient Details Name: Wesley Burns MRN: [id number] DOB: 1966-09-04   Cancelled treatment:       Reason Eval/Treat Not Completed: Patient at procedure or test/unavailable   [name] 02/10/2018, 2:58 PM

## 2018-02-10 NOTE — Clinical Social Work Note (Addendum)
Wesley Burns is medically stable for discharge and is returning to [facility], room 115 today. Discharge clinicals transmitted to facility and patient's partner, Wesley Burns ([phone]) contacted regarding today's discharge. Nurse provided with contact information to give report to receiving nurse. CSW signing off as no other SW interventions services needed at this time.  Genelle Bal, MSW, LCSW Licensed Clinical Social Worker Clinical Social Work Department [facility] [phone]

## 2018-02-10 NOTE — Care Management Note (Signed)
Case Management Note  Patient Details  Name: Wesley Burns MRN: [id number] Date of Birth: [birth year]  Subjective/Objective:  Admitted for cholecystitis.           Action/Plan: Prior to admission patient transported from SNF.   Will be returning to the same living situation after discharge, when medically stable. LSCW "Erie Noe" following for SNF.    Expected Discharge Date:  02/10/18               Expected Discharge Plan:  Skilled Nursing Facility  In-House Referral:  Clinical Social Work  Discharge planning Services  CM Consult  Status of Service:  In process, will continue to follow  Yancey Flemings, RN  Nurse case manager [facility] 02/10/2018, 11:09 AM

## 2018-02-10 NOTE — Progress Notes (Signed)
[facility] Progress Note  Dialysis Orders: TTS -[facility] 4hrs, BFR400, [id number], EDW 65kg,2K/2.5Ca   Access:LU AVF Heparin2000 Unit bolus IV qHD Hectorol IV qHD renvela 800mg  -5AC TID, 2w/snacks  Assessment/Plan: 1. Cholecystitis - s/p perc drain by IR 2/4. IR consulted- drain exchanged 3/9 by Dr. 5/9. On oral cipro 2. ESRD -TTS - for HD today 3. Hypertension/volume - BPremains slightly elevated. Continue OP meds. Titrate down volume as tolerated.If weights correct, under EDW, will likely need new EDW at d/c. Ordered hoyer weight.  4. Anemia - Hgb 8.2, no ESA as OP, start aranesp Deanne Coffer IV q2wksnext HD3/12- needs Fe studies- check with pre HD labs 5. Secondary Hyperparathyroidism - . Continue binders, VDRA. 6. Nutrition - Alb 2.8, renavite, nepro, and Renal diet with fluid restrictions- intake poor- per ST needs to sit up 90 degrees when eating - he is noted to be slouched down in bed today 7. 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 DM 9. H/o ETOH abuse 10. Gout - on colchicine 11.  Disp - wants to be d/c to SNF in the [place] area - previously in [place]; but SW tells me this is not possible at this time.  Should this become possible - we will need to make change to relocate HD in [place] as well.  Melo, PA-C [facility] Beeper [phone] 02/10/2018,10:47 AM  LOS: 3 days   Subjective:   Problems with swallowing some foods - esp bread like consistency. ST working with patient to help select foods he can swallow.  Drinking supplements.  Objective Vitals:   02/09/18 1914 02/09/18 2114 02/10/18 0637 02/10/18 0919  BP: (!) 162/89 (!) 164/76 (!) 158/81 (!) 183/83  Pulse: 89 90 87 94  Resp: 18 19 20 20   Temp: 98.4 F (36.9 C) 98.2 F (36.8 C) 98.6 F (37 C) 98.5 F (36.9 C)  TempSrc: Oral Oral Oral Oral  SpO2: 96% 98% 99% 92%  Weight:  62.3 kg (137 lb 5.6 oz)     Physical Exam General: NAD  chronically ill pale Heart: RRR Lungs: grossly clear Abdomen: soft NT c'tube on right Extremities: no sig LE edema Dialysis Access: left AVF + bruit   Additional Objective Labs: Basic Metabolic Panel: Recent Labs  Lab 02/07/18 0353 02/09/18 0659  NA 135 135  K 4.2 3.1*  CL 99* 95*  CO2 24 26  GLUCOSE 91 90  BUN 21* 14  CREATININE 6.11* 4.78*  CALCIUM 8.5* 8.4*   Liver Function Tests: Recent Labs  Lab 02/07/18 0353 02/09/18 0659  AST 22 18  ALT 17 13*  ALKPHOS 279* 228*  BILITOT 0.9 0.8  PROT 6.2* 6.0*  ALBUMIN 2.8* 2.8*   No results for input(s): LIPASE, AMYLASE in the last 168 hours. CBC: Recent Labs  Lab 02/07/18 0353 02/09/18 0659  WBC 7.1 6.6  HGB 8.5* 8.2*  HCT 26.1* 26.3*  MCV 82.6 83.8  PLT 194 207   Blood Culture    Component Value Date/Time   SDES BLOOD RIGHT HAND 02/07/2018 0411   SPECREQUEST IN PEDIATRIC BOTTLE Blood Culture adequate volume 02/07/2018 0411   CULT  02/07/2018 0411    NO GROWTH 2 DAYS Performed at [facility] Lab, [address] [place], [address] [place]    REPTSTATUS PENDING 02/07/2018 0411    Cardiac Enzymes: No results for input(s): CKTOTAL, CKMB, CKMBINDEX, TROPONINI in the last 168 hours. CBG: Recent Labs  Lab 02/07/18 0247 02/10/18 0738  GLUCAP  91 78   Iron Studies: No results for input(s): IRON, TIBC, TRANSFERRIN, FERRITIN in the last 72 hours. Lab Results  Component Value Date   INR 1.03 02/07/2018   INR 1.13 01/04/2018   INR 0.93 12/07/2017   Studies/Results: Dg Chest 2 View  Result Date: 02/09/2018 CLINICAL DATA:  Acute hypoxia. History of end-stage renal disease, hypertension, diabetes. Smoker. EXAM: CHEST - 2 VIEW COMPARISON:  Prior radiograph from 01/04/2018. FINDINGS: Mild cardiomegaly, stable. Mediastinal silhouette within normal limits. Lungs normally inflated. Small 1-2 cm nodular density overlies the peripheral left hemidiaphragm, indeterminate. Small left pleural effusion. No pulmonary  edema. No other focal infiltrates. No pneumothorax. No acute osseous abnormality. IMPRESSION: 1. Small left pleural effusion. 2. 1-2 cm nodular density overlying the left hemidiaphragm. As this is not visible on previous radiograph from 01/04/2018, focal atelectatic changes is favored. However, a superimposed nodule is not entirely excluded. Close interval follow-up radiograph in 1-2 weeks recommended to ensure resolution. 3. Mild cardiomegaly without pulmonary edema. Electronically Signed   By: Rise Mu M.D.   On: 02/09/2018 16:20   Medications:  . amLODipine  10 mg Oral Daily  . aspirin EC  81 mg Oral Daily  . ciprofloxacin  250 mg Oral Daily  . colchicine  0.3 mg Oral Daily  . darbepoetin (ARANESP) injection - DIALYSIS  100 mcg Intravenous Q Tue-HD  . doxercalciferol  2 mcg Intravenous Q T,Th,Sa-HD  . escitalopram  10 mg Oral Daily  . feeding supplement  1 Container Oral TID BM  . heparin injection (subcutaneous)  5,000 Units Subcutaneous Q8H  . metroNIDAZOLE  250 mg Oral Q8H  . multivitamin  1 tablet Oral QHS  . nicotine  21 mg Transdermal Daily  . pantoprazole  40 mg Oral Daily  . polyethylene glycol  17 g Oral BID  . sevelamer carbonate  1,600 mg Oral BID BM  . sevelamer carbonate  4,000 mg Oral TID WC

## 2018-02-12 LAB — CULTURE, BLOOD (ROUTINE X 2)
CULTURE: NO GROWTH
CULTURE: NO GROWTH
SPECIAL REQUESTS: ADEQUATE
Special Requests: ADEQUATE

## 2018-02-19 ENCOUNTER — Ambulatory Visit
Admission: RE | Admit: 2018-02-19 | Discharge: 2018-02-19 | Disposition: A | Discharge status: Home / Self Care | Payer: Self-pay | Source: Ambulatory Visit | Attending: Surgery | Admitting: Surgery

## 2018-02-19 ENCOUNTER — Encounter: Payer: Self-pay | Admitting: Radiology

## 2018-02-19 DIAGNOSIS — K8012 Calculus of gallbladder with acute and chronic cholecystitis without obstruction: Secondary | ICD-10-CM

## 2018-02-19 HISTORY — PX: IR RADIOLOGIST EVAL & MGMT: IMG5224
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
## 2018-03-03 ENCOUNTER — Ambulatory Visit: Payer: 59 | Admitting: Neurology

## 2018-03-18 ENCOUNTER — Ambulatory Visit: Payer: Self-pay | Admitting: Neurology

## 2018-03-18 ENCOUNTER — Encounter: Payer: Self-pay | Admitting: Neurology

## 2018-03-18 VITALS — BP 151/71 | HR 60 | Ht 71.0 in

## 2018-03-18 DIAGNOSIS — G8114 Spastic hemiplegia affecting left nondominant side: Secondary | ICD-10-CM

## 2018-03-18 DIAGNOSIS — K819 Cholecystitis, unspecified: Secondary | ICD-10-CM

## 2018-03-18 DIAGNOSIS — I615 Nontraumatic intracerebral hemorrhage, intraventricular: Secondary | ICD-10-CM

## 2018-03-18 DIAGNOSIS — I610 Nontraumatic intracerebral hemorrhage in hemisphere, subcortical: Secondary | ICD-10-CM

## 2018-03-18 NOTE — Progress Notes (Signed)
STROKE NEUROLOGY FOLLOW UP NOTE  NAME: Wesley Burns DOB: January 29, 1966  REASON FOR VISIT: stroke follow up HISTORY FROM: partner and pt and chart  Today we had the pleasure of seeing Wesley Burns in follow-up at our Neurology [facility]. Pt was accompanied by partner.   History Summary 52 year old male with history of ESRD on hemodialysis, HTN, CVA without deficit, BPH, depression admitted on 12/07/17 for left-sided weakness and numbness, left facial droop. 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 CTA head showed right PCA stenosis but no AVM or aneurysm. Repeat CT stable. EF55-60%, carotid Doppler negative. A1c 4.0 and LDL 73. CT head on 12/27/17 showed right thalamic ICH reabsorbed, started him on ASA 81mg . PT OT recommended SNF.  During admission, patient BP improvedwith Norvasc and clonidine and added hydralazine. Pt developed abdominal pain and nausea on 12/30/17, put on NPO, and  12/31/17 symptom resolved and repeat KUB normal. Resumed diet. Had spiking fever and again abdominal pain.  Abdominal CT showed possible acute vs. Chronic cholecystitis and blood culture showed E. Coli bacteremia. Had IM hospitalist consult and Found to have elevated LFTs and lipase, concerning for biliary pancreatitis. Kept NPO, and started on ampicillin. He did not have adequate po for the last several days and after HD on 01/03/18 while on ampicillin infusion, he developed AMS with diffuse diaphoresis, hypotensive SBP 60s, poorly responsive, hypoxia with O2 sat down to 90, and tachycardia at 110s. Put pt bed flat, started IV bolus, put on non-rebreather. Pt gradually more responsive, although still lethargic. Denies abdominal pain. SBP up to 70s. CCM consulted and transferred to ICU. BP getting improved to 120s, but have spike fever at 101.3. HIDA scan again concerning for acute cholecystitis, put on meropenem and IR team performed percutaneous cholecystectomy on 01/05/18. BP improved with IV  fluids and patient returned back to baseline. During hospital stay patient received his normal dalysis with no issues. Patient finally discharged to SNF in [place], [place], on 01/11/18  On 02/07/18 she was readmitted to [facility] due to right upper quadrant abdominal pain, fever or chills, nausea and vomiting for 3 days. Treated with Abx, found to have malpositioned gallbladder drain which was changed and replaced. Surgery recommended follow-up with general surgery in 4-5 weeks, for evaluation for interval cholecystectomy. He was sent back to SNF after.  Interval History During the interval time, the patient has been doing better. Left sided hemiplegia improved some, but still wheelchair bound. Has PT/OT in local rehab. On dialysis, last month missed on dialysis, had fluid overload and was treated in local hospital for pulmonary edema.  BP today 151/71. On norvasc.   REVIEW OF SYSTEMS: Full 14 system review of systems performed and notable only for those listed below and in HPI above, all others are negative:  Constitutional:   Cardiovascular:  Ear/Nose/Throat:   Skin:  Eyes:   Respiratory:   Gastroitestinal:   Genitourinary:  Hematology/Lymphatic:   Endocrine:  Musculoskeletal:   Allergy/Immunology:   Neurological:   Psychiatric:  Sleep:   The following represents the patient's updated allergies and side effects list: Allergies  Allergen Reactions  . Prednisone Hives, Nausea And Vomiting and Swelling    Joint swelling, also Joint swelling  . Tramadol Hives, Itching and Rash  . Amlodipine Nausea And Vomiting  . Zosyn [Piperacillin Sod-Tazobactam So]     Hot flash and shortness of breath  . Lactose Intolerance (Gi) Diarrhea    No dairy  . Adhesive [Tape] Rash  The neurologically relevant items on the patient's problem list were reviewed on today's visit.  Neurologic Examination  A problem focused neurological exam (12 or more points of the single system neurologic examination, vital  signs counts as 1 point, cranial nerves count for 8 points) was performed.  Blood pressure (!) 151/71, pulse 60, height 5\' 11"  (1.803 m).  General - Well nourished, well developed, in no apparent distress.  Ophthalmologic - Sharp disc margins OU.   Cardiovascular - Regular rate and rhythm.  Mental Status -  Level of arousal and orientation to time, place, and person were intact. Language including expression, naming, repetition, comprehension was assessed and found intact. Fund of Knowledge was assessed and was intact.  Cranial Nerves II - XII - II - Visual field intact OU. III, IV, VI - Extraocular movements intact. V - Facial sensation intact bilaterally. VII - left facial droop. VIII - Hearing & vestibular intact bilaterally. X - Palate elevates symmetrically. XI - Chin turning & shoulder shrug intact bilaterally. XII - Tongue protrusion intact.  Motor Strength - The patient's strength was normal in RUE and RLE, however, LUE 2+/5 proximal and distal, LLE 3/5 proximal, 0/5 distal, 3/5 knee extension. Limited left shoulder ROM.  Bulk was normal and fasciculations were absent.   Motor Tone - Muscle tone was assessed at the neck and appendages and was normal.  Reflexes - The patient's reflexes were 1+ in all extremities and he had no pathological reflexes.  Sensory - Light touch, temperature/pinprick were assessed and were normal.    Coordination - The patient had normal movements in the right hand with no ataxia or dysmetria.  Tremor was absent.  Gait and Station - wheelchair bound, not tested   Functional score  mRS = 4   0 - No symptoms.   1 - No significant disability. Able to carry out all usual activities, despite some symptoms.   2 - Slight disability. Able to look after own affairs without assistance, but unable to carry out all previous activities.   3 - Moderate disability. Requires some help, but able to walk unassisted.   4 - Moderately severe disability.  Unable to attend to own bodily needs without assistance, and unable to walk unassisted.   5 - Severe disability. Requires constant nursing care and attention, bedridden, incontinent.   6 - Dead.   NIH Stroke Scale   Level Of Consciousness 0=Alert; keenly responsive 1=Not alert, but arousable by minor stimulation 2=Not alert, requires repeated stimulation 3=Responds only with reflex movements 0  LOC Questions to Month and Age 66=Answers both questions correctly 1=Answers one question correctly 2=Answers neither question correctly 0  LOC Commands      -Open/Close eyes     -Open/close grip 0=Performs both tasks correctly 1=Performs one task correctly 2=Performs neighter task correctly 0  Best Gaze 0=Normal 1=Partial gaze palsy 2=Forced deviation, or total gaze paresis 0  Visual 0=No visual loss 1=Partial hemianopia 2=Complete hemianopia 3=Bilateral hemianopia (blind including cortical blindness) 0  Facial Palsy 0=Normal symmetrical movement 1=Minor paralysis (asymmetry) 2=Partial paralysis (lower face) 3=Complete paralysis (upper and lower face) 2  Motor  0=No drift, limb holds posture for full 10 seconds 1=Drift, limb holds posture, no drift to bed 2=Some antigravity effort, cannot maintain posture, drifts to bed 3=No effort against gravity, limb falls 4=No movement Right Arm 0     Leg 0    Left Arm 2     Leg 2  Limb Ataxia 0=Absent 1=Present in one limb 2=Present  in two limbs 0  Sensory 0=Normal 1=Mild to moderate sensory loss 2=Severe to total sensory loss 0  Best Language 0=No aphasia, normal 1=Mild to moderate aphasia 2=Mute, global aphasia 3=Mute, global aphasia 0  Dysarthria 0=Normal 1=Mild to moderate 2=Severe, unintelligible or mute/anarthric 1  Extinction/Neglect 0=No abnormality 1=Extinction to bilateral simultaneous stimulation 2=Profound neglect 0  Total   7     Data reviewed: I personally reviewed the images and agree with the radiology  interpretations.   NM Hepatobiliary Scan Including Gall Bladder  01/04/2018 Nonvisualization of the gallbladder consistent with occlusion at the level of the cystic duct, consistent with acute cholecystitis.  Slow progression of radiotracer to the small bowel, consistent with at least partially patent extrahepatic common bile duct.  DG Chest Portable 1 View 01/04/2018 IMPRESSION: Hypoinflation. No evidence of acute airspace disease.   Ct Head Code Stroke Wo Contrast Result Date: 12/07/2017 IMPRESSION:  1. Acute right thalamic hemorrhage. Mild edema and local mass effect.  2. 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  3. New age indeterminate left thalamic lacunar infarct.    Ct Head Wo Contrast 12/27/2017  IMPRESSION: Recent right thalamic intraparenchymal hematoma is now isodense to edematous brain.  No evidence of additional bleeding. Old hemorrhagic infarction in the right basal ganglia. Resolution of intraventricular blood. Ventricular size stable.  DG Chest Port 1 View 12/26/2017 IMPRESSION: No active disease.  Ct Angio Head W Or Wo Contrast Result Date: 12/07/2017 IMPRESSION: 1. No aneurysm, vascular malformation, or spot sign associated with the right thalamic hemorrhage. 2. Patent circle of Willis with intracranial atherosclerosis as above.  Moderate to severe right PCA stenoses.   Ct Head Wo Contrast Result Date: 12/08/2017 IMPRESSION:  1. Stable right thalamic hematoma and small volume of hemorrhage in right lateral ventricle.  2. Mild increase of mass effect with 5 mm right-to-left midline shift, previously 4 mm.  3. No new acute intracranial abnormality identified.   ECHO  12/12/2017 Study Conclusions - Left ventricle: The cavity size was normal. There was moderate concentric hypertrophy. Systolic function was normal. The estimated ejection fraction was in the range of 55% to 60%. Wall motion was  normal; there were no regional wall motion abnormalities. There was a reduced contribution of atrial contraction to ventricular filling, due to increased ventricular diastolic pressure or atrial contractile dysfunction. Doppler parameters are consistent with a reversible restrictive pattern, indicative of decreased left ventricular diastolic compliance and/or increased left atrial pressure (grade 3 diastolic dysfunction). Doppler parameters are consistent with high ventricular filling pressure. - Aortic valve: Trileaflet; mildly thickened, mildly calcified leaflets. Moderate focal calcification involving the left coronary cusp. - Aorta: Ascending aorta diameter: 40 mm (ED). - Ascending aorta: The ascending aorta was mildly dilated. - Mitral valve: Calcified annulus. There was mild regurgitation. - Left atrium: The atrium was mildly dilated. - Right ventricle: The cavity size was mildly dilated. Wall thickness was normal. - Tricuspid valve: There was trivial regurgitation  Carotid Duplex           12/11/2017 Final Interpretation: Right Carotid:  The extracranial vessels were near-normal with only minimal wall thickening or plaque. Left Carotid: The extracranial vessels were near-normal with only minimal wall thickening or plaque. Vertebrals: Left vertebral artery was patent with antegrade flow.  Right vertebral artery was not visualized. Subclavians: Normal flow hemodynamics were seen in the right subclavian artery.  Left subclavian artery exhibits monophasic flow, suggestive of possbile proximal obstruction  ABDOMEN - 1 VIEW  12/30/2017 11:34 IMPRESSION: Air-filled loops of small and large bowel noted suggesting adynamic ileus. Follow-up exam suggested to exclude bowel obstruction.  ABDOMEN - 1 VIEW      12/31/2017 09:48 IMPRESSION No evidence of bowel obstruction or ileus.  PORTABLE CHEST 1 VIEW 01/02/2018 08:42 IMPRESSION: No evidence of  acute cardiopulmonary disease  CT Abdomen/Pelvis 01/02/2018 Multiple gallstones with thickening and distention of the gallbladder with slight haziness in the pericholecystic fat which could represent acute or chronic cholecystitis. Small amount of free fluid in the pelvis.  Component     Latest Ref Rng & Units 12/07/2017 12/08/2017 12/09/2017 12/15/2017  Cholesterol     0 - 200 mg/dL   998   Triglycerides     <150 mg/dL   54   HDL Cholesterol     >40 mg/dL   65   Total CHOL/HDL Ratio     RATIO   2.3   VLDL     0 - 40 mg/dL   11   LDL (calc)     0 - 99 mg/dL   73   Hemoglobin P3A     4.8 - 5.6 %  4.0 (L)    Mean Plasma Glucose     mg/dL  25.0    HIV Screen 4th Generation wRfx     Non Reactive Non Reactive     Vitamin B12     180 - 914 pg/mL    587    Assessment: As you may recall, he is a 52 y.o. Caucasian male with PMH of ESRD on hemodialysis, HTN, CVA without deficit, BPH, depression admitted on 12/07/17 for right thalamic ICH with IVH, left thalamic stroke. CTA head showed right PCA stenosis but no AVM or aneurysm. Repeat CT stable. EF55-60%, carotid Doppler negative. A1c 4.0 and LDL 73. CT head on 12/27/17 showed right thalamic ICH reabsorbed, started him on ASA 81mg . PT OT recommended SNF.  During admission, patient developed acute cholecystitis, bacteremia, sepsis, septic shock. CCM consulted and transferred to ICU. HIDA scan again concerning for acute cholecystitis, put on meropenem and IR team performed percutaneous cholecystectomy on 01/05/18. readmitted on 02/07/18 for abdominal pain, fever or chills, nausea and vomiting. Treated with Abx, found to have malpositioned gallbladder drain which was changed and replaced. Surgery recommended follow-up with general surgery in 4-5 weeks, for evaluation for interval cholecystectomy. During the interval time, the patient still has left sided hemiplegia improved some, but still wheelchair bound. Has PT/OT in local rehab.   Plan:  -  continue ASA for stroke prevention - need more aggressive PT/OT to improve left UE and LE strength. Recommend at least 30 min rehab on the days without dialysis - will recommend PMR referral to consider botox treatment for left side spasticity - follow up with surgery for cholecystitis and tube removal  - discontinue the salt tablet twice a day as your BP still on the high end - continue dialysis as per schedule - recommend to continue self exercise to improve function  - Follow up with your primary care physician for stroke risk factor modification. Recommend maintain blood pressure goal <130/80, diabetes with hemoglobin A1c goal below 7.0% and lipids with LDL cholesterol goal below 70 mg/dL.  - check BP at facility at least once a day and BP goal < 140/90.  - follow up in 2 months with Dr. 04/09/18. If not available, with Pearlean Brownie.   I spent more than 25 minutes of face to face time with the patient. Greater than 50%  of time was spent in counseling and coordination of care.    Orders Placed This Encounter  Procedures  . Ambulatory referral to Physical Medicine Rehab    Referral Priority:   Urgent    Referral Type:   Rehabilitation    Referral Reason:   Specialty Services Required    Requested Specialty:   Physical Medicine and Rehabilitation    Number of Visits Requested:   1    No orders of the defined types were placed in this encounter.   Patient Instructions  - continue ASA for stroke prevention - need more aggressive PT/OT to improve left UE and LE strength. Recommend at least 30 min rehab on the days without dialysis - will recommend PMR referral to consider botox treatment for left side spasticity - follow up with surgery for cholecystitis and tube removal  - discontinue the salt tablet twice a day as your BP still on the high end - continue dialysis as per schedule - recommend to continue self exercise to improve function  - Follow up with your primary care physician for  stroke risk factor modification. Recommend maintain blood pressure goal <130/80, diabetes with hemoglobin A1c goal below 7.0% and lipids with LDL cholesterol goal below 70 mg/dL.  - check BP at facility at least once a day and BP goal < 140/90.  - follow up in 2 months with Dr. Pearlean Brownie. If not available, with Shanda Bumps.    Marvel Plan, MD PhD [facility] [address] [place], [place] [address] [phone]

## 2018-03-18 NOTE — Patient Instructions (Addendum)
-   continue ASA for stroke prevention - need more aggressive PT/OT to improve left UE and LE strength. Recommend at least 30 min rehab on the days without dialysis - will recommend PMR referral to consider botox treatment for left side spasticity - follow up with surgery for cholecystitis and tube removal  - discontinue the salt tablet twice a day as your BP still on the high end - continue dialysis as per schedule - recommend to continue self exercise to improve function  - Follow up with your primary care physician for stroke risk factor modification. Recommend maintain blood pressure goal <130/80, diabetes with hemoglobin A1c goal below 7.0% and lipids with LDL cholesterol goal below 70 mg/dL.  - check BP at facility at least once a day and BP goal < 140/90.  - follow up in 2 months with Dr. Pearlean Brownie. If not available, with Shanda Bumps.

## 2018-03-24 ENCOUNTER — Other Ambulatory Visit: Payer: Self-pay | Admitting: Surgery

## 2018-03-24 DIAGNOSIS — K819 Cholecystitis, unspecified: Secondary | ICD-10-CM

## 2018-04-03 ENCOUNTER — Other Ambulatory Visit: Payer: Self-pay | Admitting: Surgery

## 2018-04-03 ENCOUNTER — Ambulatory Visit (HOSPITAL_COMMUNITY)
Admission: RE | Admit: 2018-04-03 | Discharge: 2018-04-03 | Disposition: A | Discharge status: Home / Self Care | Payer: Medicaid Other | Source: Ambulatory Visit | Attending: Surgery | Admitting: Surgery

## 2018-04-03 ENCOUNTER — Encounter (HOSPITAL_COMMUNITY): Payer: Self-pay | Admitting: Interventional Radiology

## 2018-04-03 DIAGNOSIS — Z4682 Encounter for fitting and adjustment of non-vascular catheter: Secondary | ICD-10-CM | POA: Insufficient documentation

## 2018-04-03 DIAGNOSIS — K819 Cholecystitis, unspecified: Secondary | ICD-10-CM

## 2018-04-03 HISTORY — PX: IR CHOLANGIOGRAM EXISTING TUBE: IMG6040

## 2018-04-03 MED ORDER — IOPAMIDOL (ISOVUE-300) INJECTION 61%
INTRAVENOUS | Status: AC
  Administered 2018-04-03: 30 mL
  Filled 2018-04-03: qty 50

## 2018-04-07 ENCOUNTER — Other Ambulatory Visit (HOSPITAL_COMMUNITY): Payer: Self-pay | Admitting: Interventional Radiology

## 2018-04-07 DIAGNOSIS — K819 Cholecystitis, unspecified: Secondary | ICD-10-CM

## 2018-04-22 ENCOUNTER — Ambulatory Visit (HOSPITAL_COMMUNITY): Payer: MEDICAID

## 2018-04-29 ENCOUNTER — Ambulatory Visit: Payer: Self-pay | Admitting: Neurology

## 2018-05-13 ENCOUNTER — Encounter: Payer: Self-pay | Admitting: Physical Medicine & Rehabilitation

## 2018-05-18 ENCOUNTER — Telehealth: Payer: Self-pay

## 2018-05-18 ENCOUNTER — Ambulatory Visit: Payer: Self-pay | Admitting: Adult Health

## 2018-05-18 ENCOUNTER — Other Ambulatory Visit: Payer: Self-pay

## 2018-05-18 ENCOUNTER — Encounter: Payer: Self-pay | Admitting: Adult Health

## 2018-05-18 VITALS — BP 156/70 | HR 65 | Ht 71.0 in

## 2018-05-18 DIAGNOSIS — E1142 Type 2 diabetes mellitus with diabetic polyneuropathy: Secondary | ICD-10-CM

## 2018-05-18 DIAGNOSIS — I63011 Cerebral infarction due to thrombosis of right vertebral artery: Secondary | ICD-10-CM

## 2018-05-18 DIAGNOSIS — G8114 Spastic hemiplegia affecting left nondominant side: Secondary | ICD-10-CM

## 2018-05-18 DIAGNOSIS — I10 Essential (primary) hypertension: Secondary | ICD-10-CM

## 2018-05-18 DIAGNOSIS — I610 Nontraumatic intracerebral hemorrhage in hemisphere, subcortical: Secondary | ICD-10-CM

## 2018-05-18 MED ORDER — ASPIRIN 81 MG PO TABS: 81.0000 mg | ORAL_TABLET | Freq: Every day | ORAL | 3 refills | Status: AC

## 2018-05-18 MED ORDER — ASPIRIN 81 MG PO TABS: 81.0000 mg | ORAL_TABLET | Freq: Every day | ORAL | 3 refills | Status: DC

## 2018-05-18 NOTE — Telephone Encounter (Signed)
Shanda Bumps NP receive discharge summary from [facility] hospital which is [facility]. Shanda Bumps NP stating per discharge notes pt was to continue Aspirin 81mg . Rx done for Aspirin 81mg , will be fax to [facility].

## 2018-05-18 NOTE — Telephone Encounter (Signed)
Medical records release form fax to [facility] known at [facility] at [phone]. Sharmon Leyden NP. Pt was in hospital two weeks ago.

## 2018-05-18 NOTE — Patient Instructions (Signed)
We will be looking at records from [facility] and possibly restart aspirin for secondary stroke prevention  Continue to follow up with PCP regarding cholesterol and blood pressure management   Continue therapies PT/OT  Take tizanidine prior to doing PT/OT to help with spasticity   Obtain botox with Dr. Allena Katz on July 12th for botox injections  Continue to monitor blood pressure at home  Maintain strict control of hypertension with blood pressure goal below 130/90, diabetes with hemoglobin A1c goal below 6.5% and cholesterol with LDL cholesterol (bad cholesterol) goal below 70 mg/dL. I also advised the patient to eat a healthy diet with plenty of whole grains, cereals, fruits and vegetables, exercise regularly and maintain ideal body weight.  Followup in the future with me in 6 months or call earlier if needed       Thank you for coming to see [place] at [facility]. I hope we have been able to provide you high quality care today.  You may receive a patient satisfaction survey over the next few weeks. We would appreciate your feedback and comments so that we may continue to improve ourselves and the health of our patients.

## 2018-05-18 NOTE — Telephone Encounter (Signed)
[facility]n [facility] in [place] at [phone]. Rn stated Shanda Bumps NP receive hospital discharge from pts hospitalization in May 2019 in [place]. RN stated pt was in hospital for abdominal issues not stroke. Rn stated Aspirin 81mg  will be restarted. Rn was given fax number of [phone]. Aspirin rx fax and confirmed.

## 2018-05-18 NOTE — Progress Notes (Signed)
I agree with the above plan 

## 2018-05-18 NOTE — Addendum Note (Signed)
Addended by: George Hugh on: 05/18/2018 04:49 PM   Modules accepted: Orders
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
## 2018-05-19 NOTE — Telephone Encounter (Signed)
RN Psychiatrist. Rn spoke with [facility] LPN.RN stated Shanda Bumps NP wrote a rx to restart Aspirin 81mg . RN stated it was fax on yesterday after 5pm. went to look for fax and stated it was not there or in pts chart. Rn stated the fax was confirmed. [facility] LPN request for the aspirin 81mg  to be refax agin to [phone] to [facility]. Rn refax aspirin rx 81mg  to [facility] .Fax confirmed.

## 2018-05-19 NOTE — Telephone Encounter (Signed)
Rn call back ask for [facility] LPN. RN ask if she receive the rx aspirin. Enrique Sack stated she receive the rx of aspirin 81mg .

## 2018-05-23 IMAGING — CR DG SHOULDER 2+V*L*
3 series · 3 of 3 positions shown · non-contrast
Comparison: Operative images of 09/04/2015

CLINICAL DATA: LEFT shoulder pain question dislocation, history of
stroke, cannot feel or move LEFT arm

EXAM:
LEFT SHOULDER - 2+ VIEW

[shoulder grashey]
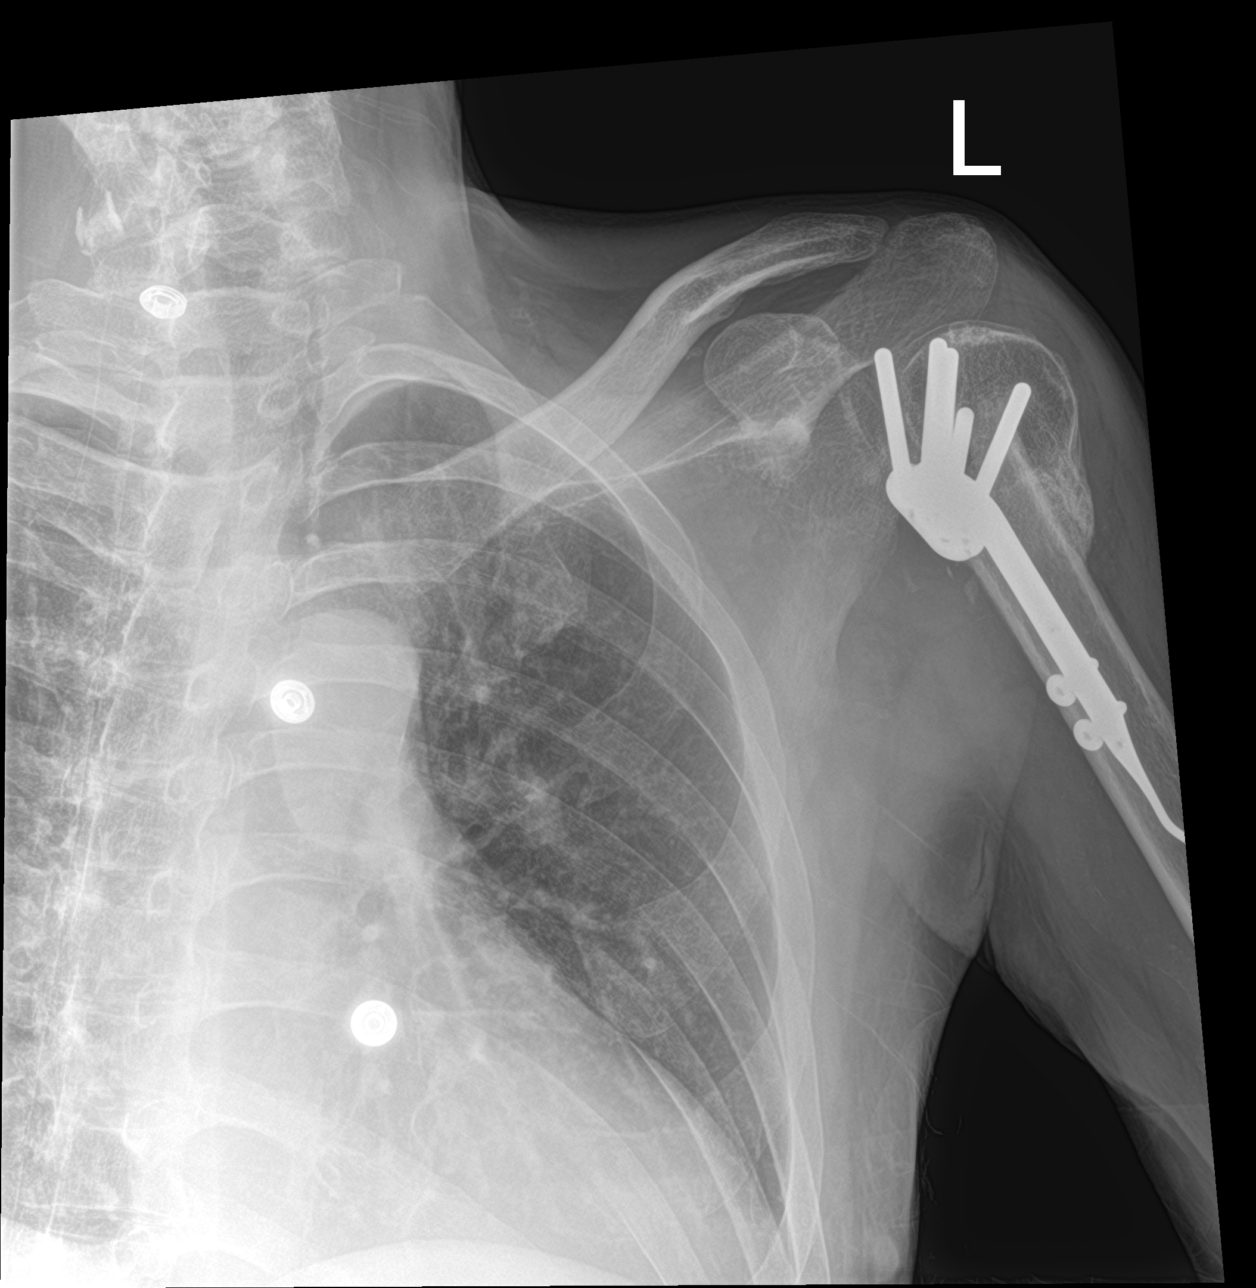

[shoulder y view]
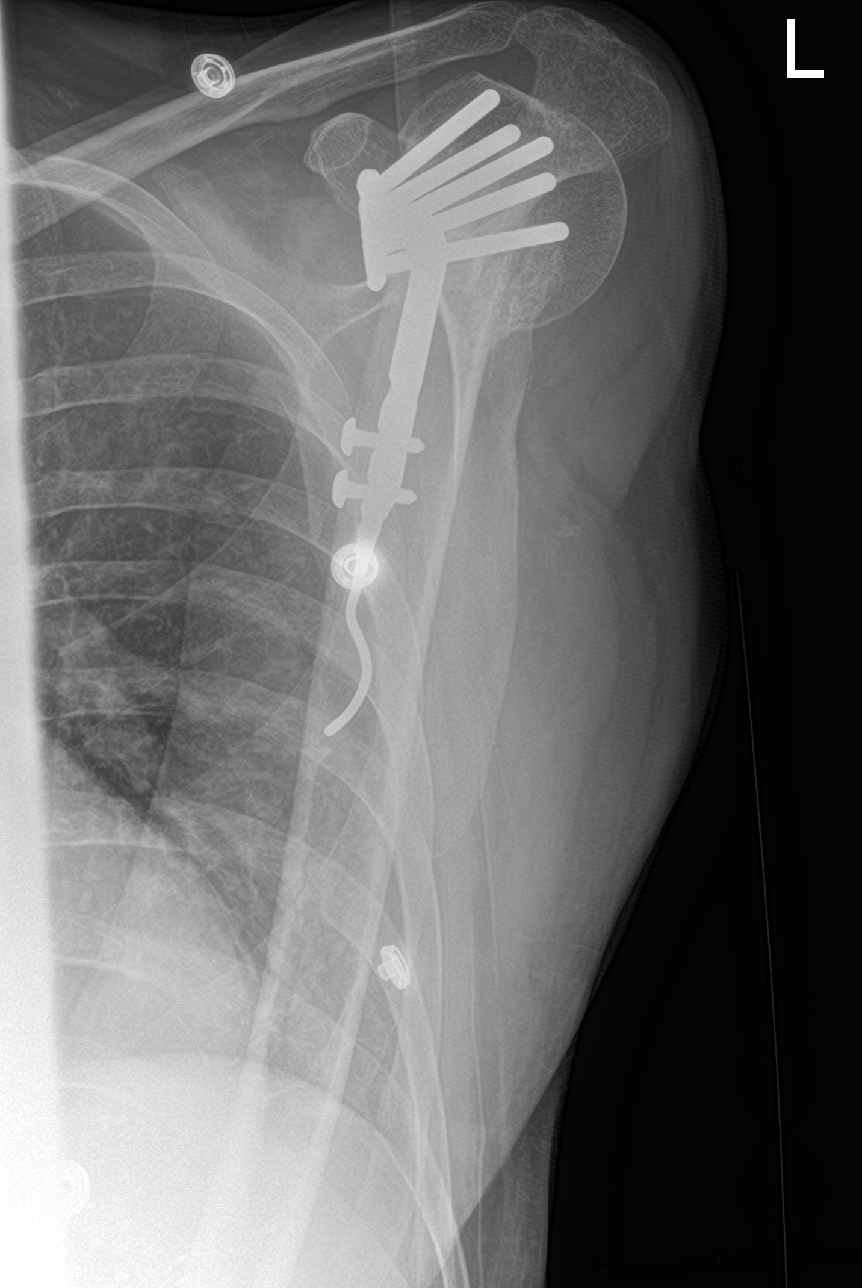

[shoulder ap neutral]
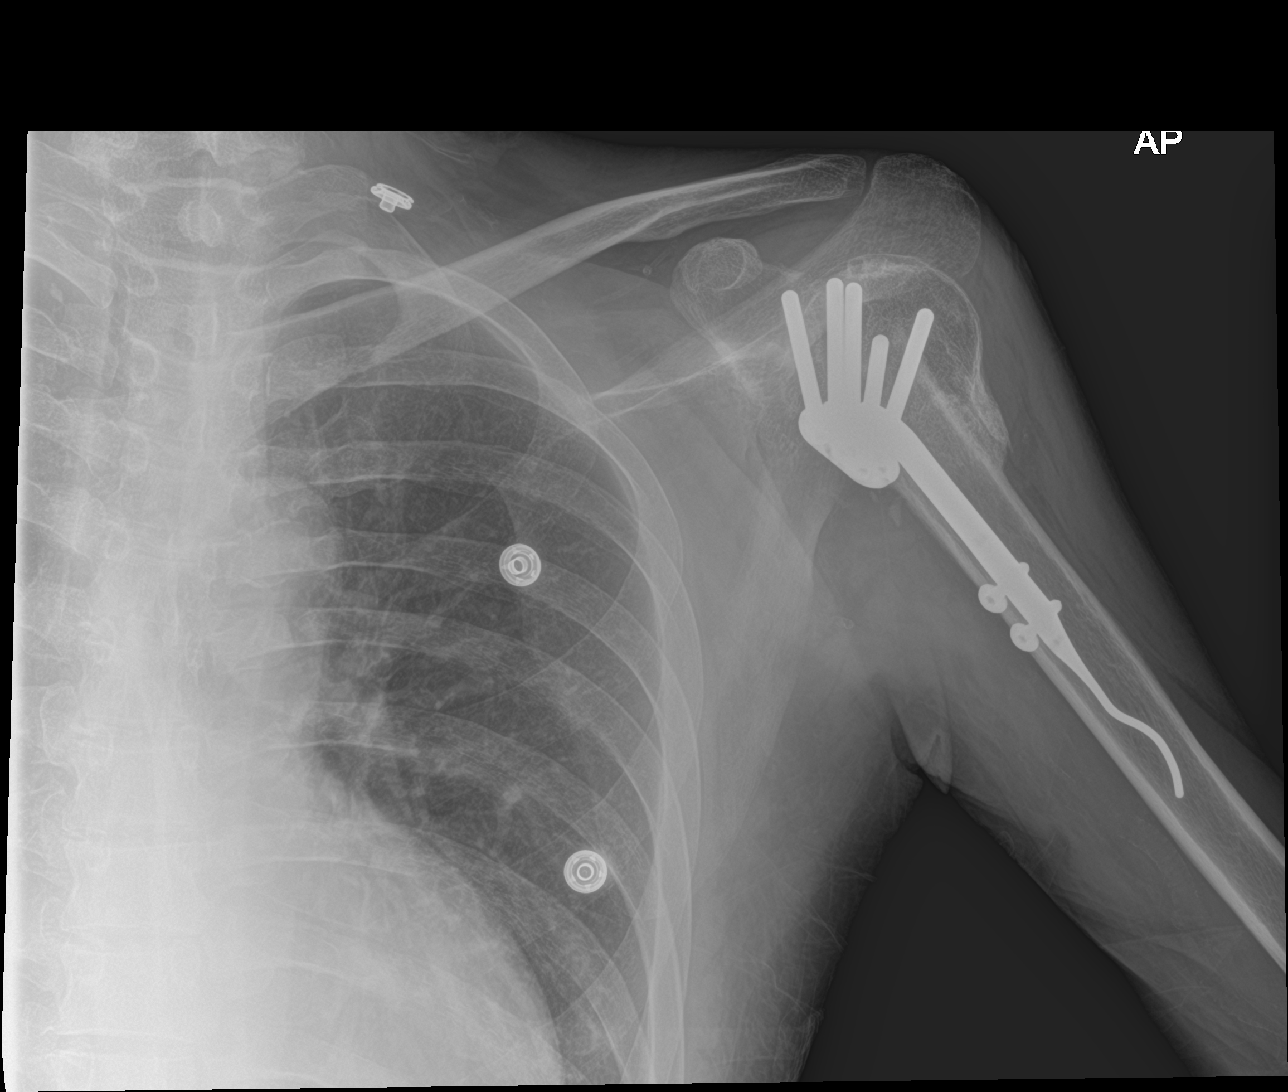

[3 of 3 positions shown; findings below may reference images not displayed]

FINDINGS: Diffuse osseous demineralization.

AC joint alignment normal.

Mild inferior subluxation of LEFT humeral head.

Old healed posttraumatic deformity of the surgical neck LEFT humerus
with orthopedic hardware.

One of the pegs at the LEFT humeral head extends beyond the cortical
margin of the LEFT humeral head likely into the LEFT glenohumeral
joint space.

No acute fracture, dislocation, or bone destruction.
IMPRESSION: Osseous demineralization without acute fracture or dislocation.

A PEG of the orthopedic hardware at the LEFT humeral head extends
extra osseous at the LEFT humeral head likely into the LEFT shoulder
joint space.

## 2018-06-12 ENCOUNTER — Encounter: Payer: Medicaid Other | Attending: Physical Medicine & Rehabilitation | Admitting: Physical Medicine & Rehabilitation

## 2018-06-12 ENCOUNTER — Encounter: Payer: Self-pay | Admitting: Physical Medicine & Rehabilitation

## 2018-06-12 VITALS — BP 175/79 | HR 61 | Resp 14 | Ht 71.0 in | Wt 150.0 lb

## 2018-06-12 DIAGNOSIS — G8114 Spastic hemiplegia affecting left nondominant side: Secondary | ICD-10-CM | POA: Insufficient documentation

## 2018-06-12 DIAGNOSIS — Z9889 Other specified postprocedural states: Secondary | ICD-10-CM | POA: Diagnosis not present

## 2018-06-12 DIAGNOSIS — Z8673 Personal history of transient ischemic attack (TIA), and cerebral infarction without residual deficits: Secondary | ICD-10-CM | POA: Insufficient documentation

## 2018-06-12 DIAGNOSIS — Z8261 Family history of arthritis: Secondary | ICD-10-CM | POA: Insufficient documentation

## 2018-06-12 DIAGNOSIS — E1122 Type 2 diabetes mellitus with diabetic chronic kidney disease: Secondary | ICD-10-CM | POA: Diagnosis not present

## 2018-06-12 DIAGNOSIS — I69398 Other sequelae of cerebral infarction: Secondary | ICD-10-CM

## 2018-06-12 DIAGNOSIS — Z833 Family history of diabetes mellitus: Secondary | ICD-10-CM | POA: Diagnosis not present

## 2018-06-12 DIAGNOSIS — Z8249 Family history of ischemic heart disease and other diseases of the circulatory system: Secondary | ICD-10-CM | POA: Diagnosis not present

## 2018-06-12 DIAGNOSIS — M069 Rheumatoid arthritis, unspecified: Secondary | ICD-10-CM | POA: Diagnosis not present

## 2018-06-12 DIAGNOSIS — Z96649 Presence of unspecified artificial hip joint: Secondary | ICD-10-CM | POA: Insufficient documentation

## 2018-06-12 DIAGNOSIS — R269 Unspecified abnormalities of gait and mobility: Secondary | ICD-10-CM | POA: Insufficient documentation

## 2018-06-12 DIAGNOSIS — E11621 Type 2 diabetes mellitus with foot ulcer: Secondary | ICD-10-CM | POA: Diagnosis not present

## 2018-06-12 DIAGNOSIS — I69354 Hemiplegia and hemiparesis following cerebral infarction affecting left non-dominant side: Secondary | ICD-10-CM

## 2018-06-12 DIAGNOSIS — E1140 Type 2 diabetes mellitus with diabetic neuropathy, unspecified: Secondary | ICD-10-CM | POA: Diagnosis not present

## 2018-06-12 DIAGNOSIS — I120 Hypertensive chronic kidney disease with stage 5 chronic kidney disease or end stage renal disease: Secondary | ICD-10-CM | POA: Diagnosis not present

## 2018-06-12 DIAGNOSIS — N186 End stage renal disease: Secondary | ICD-10-CM | POA: Insufficient documentation
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
## 2018-06-26 IMAGING — CR DG CHEST 2V
2 series · 2 of 2 positions shown · non-contrast
Comparison: Prior radiograph from 01/04/2018.

CLINICAL DATA: Acute hypoxia. History of end-stage renal disease,
hypertension, diabetes. Smoker.

EXAM:
CHEST - 2 VIEW

[chest lat]
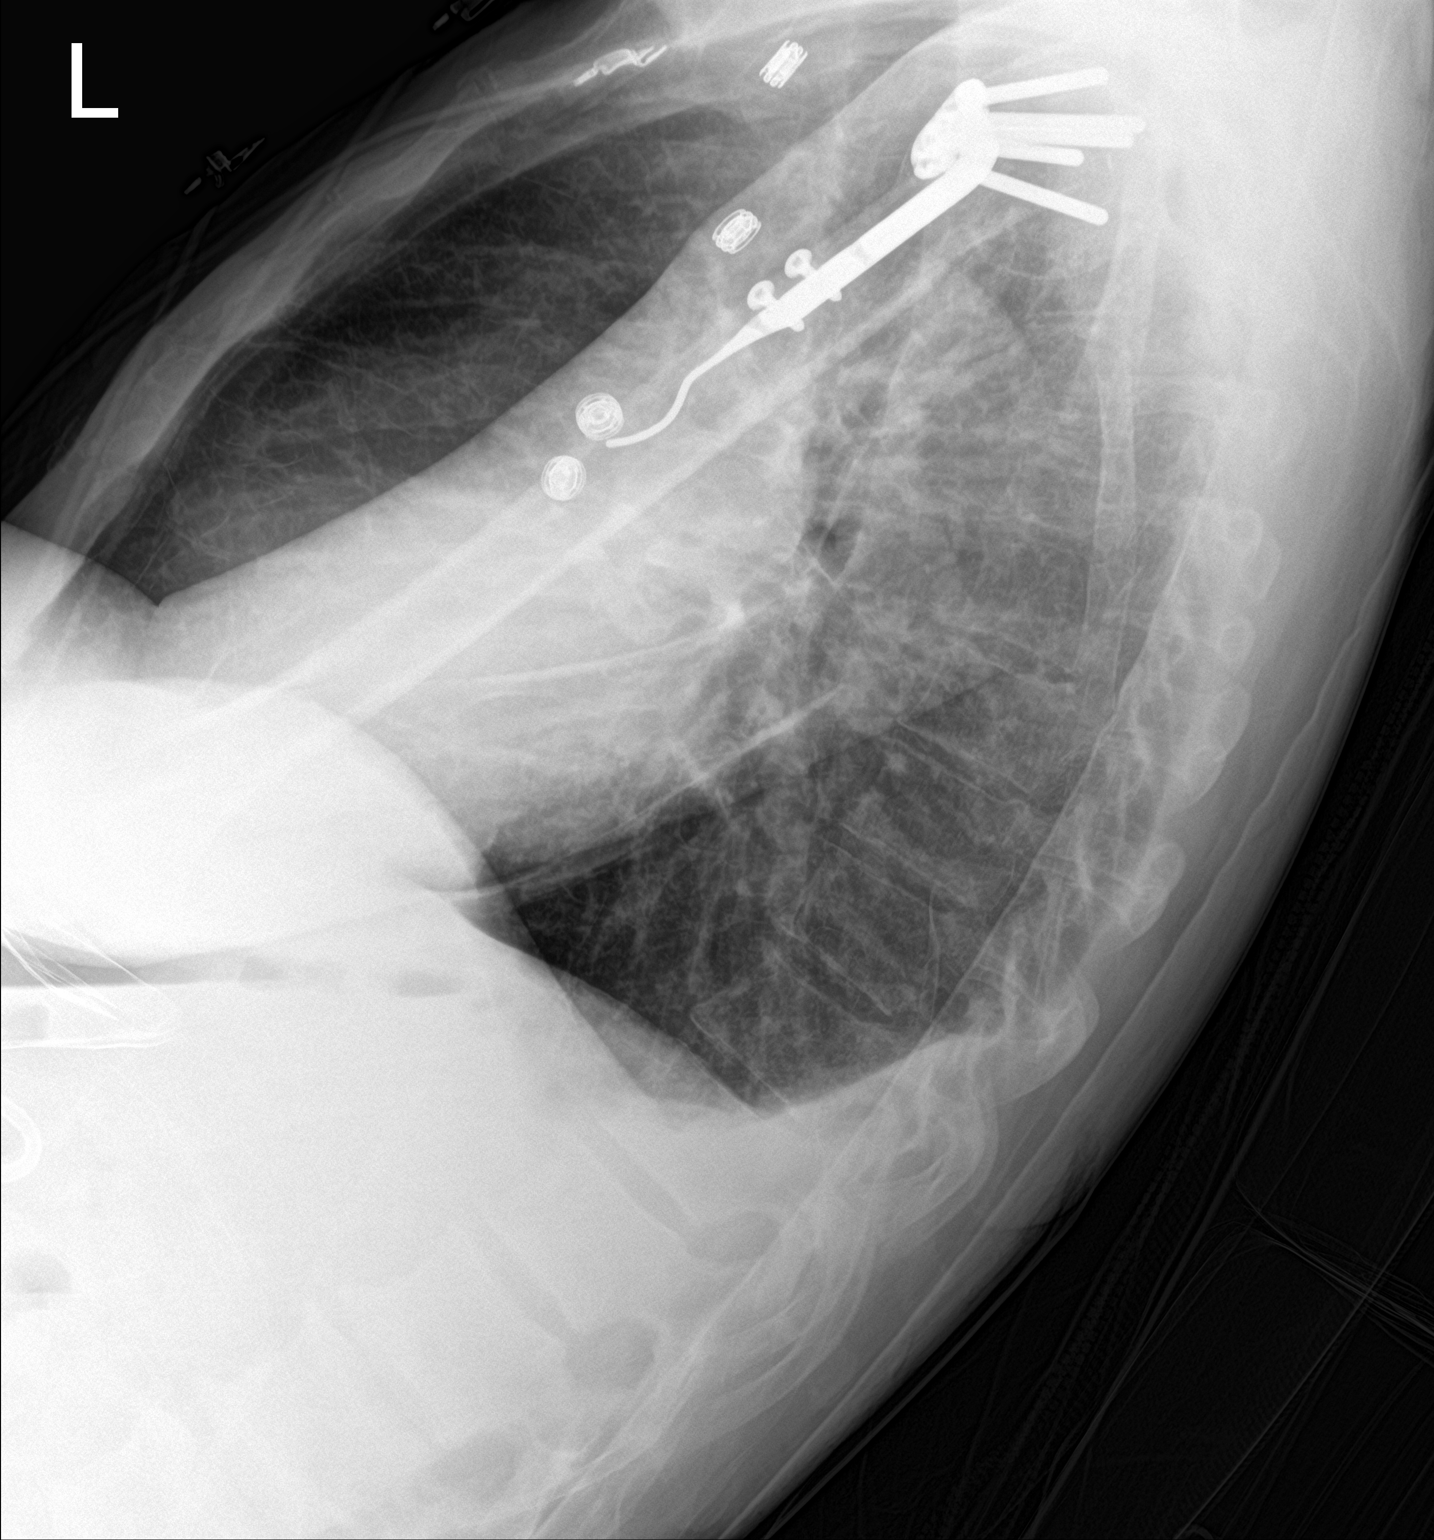

[chest ap]
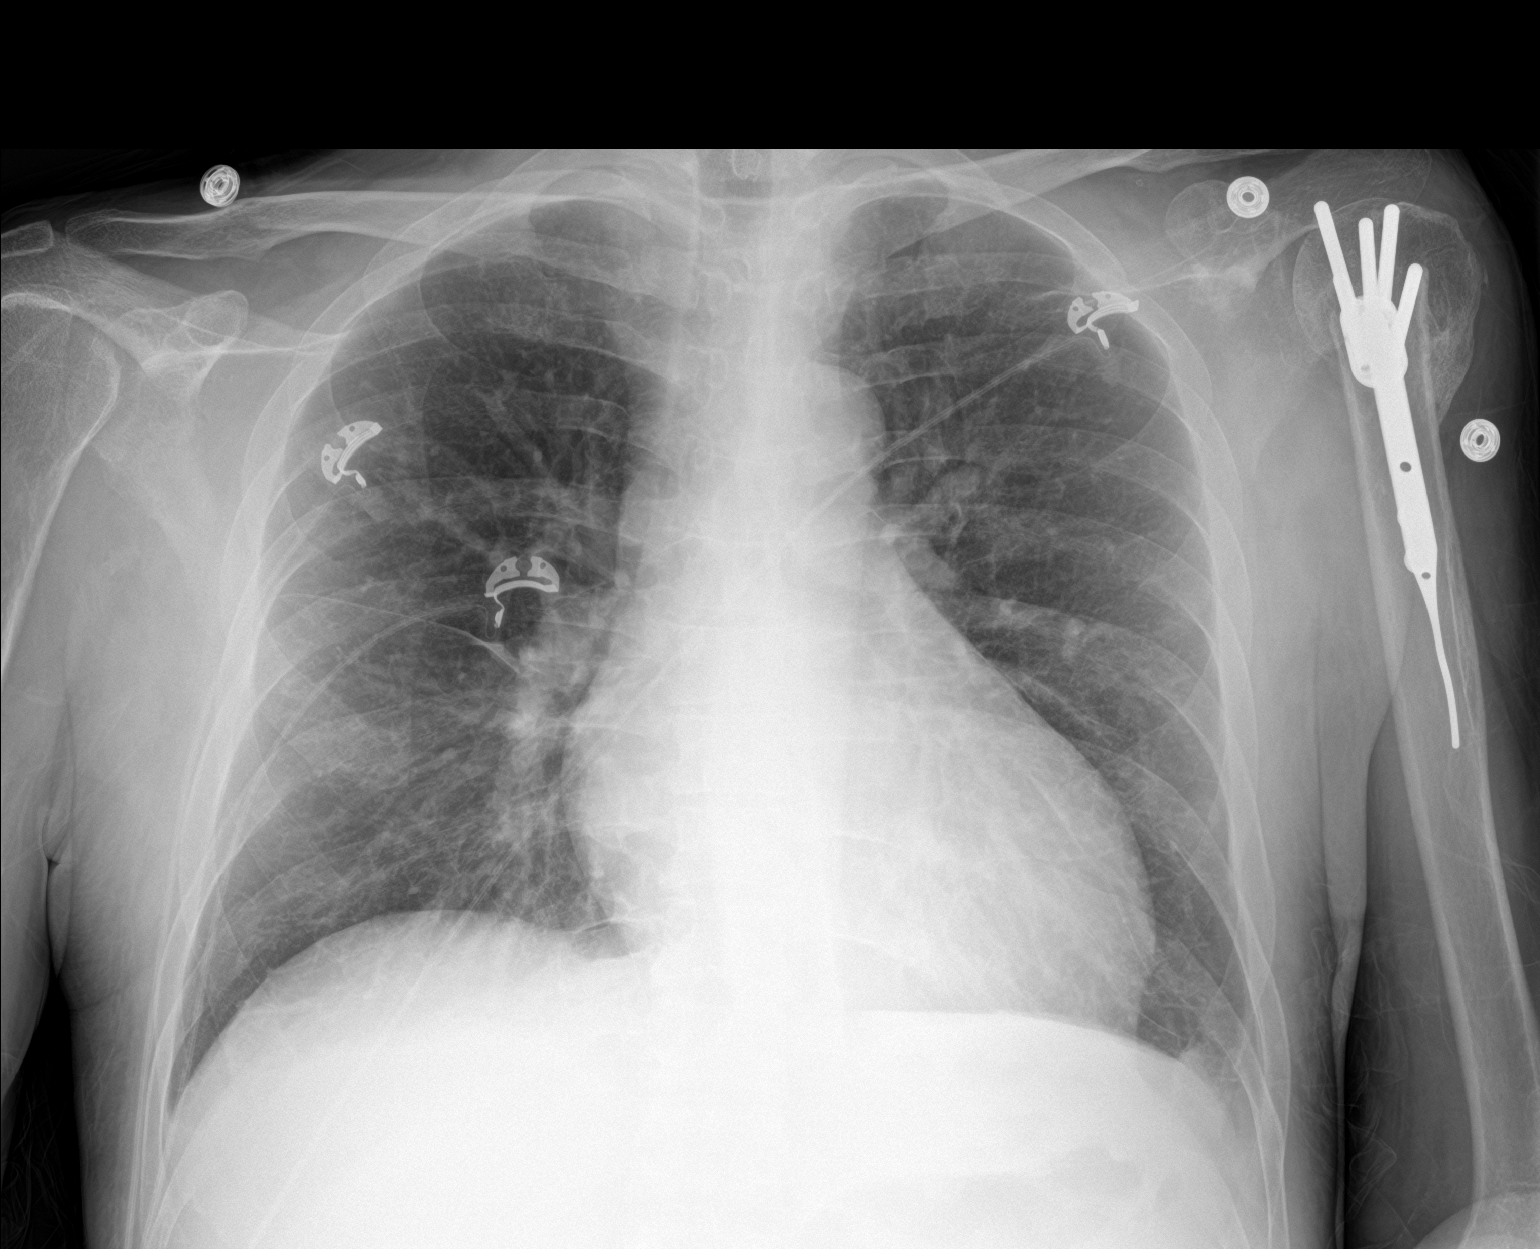

[2 of 2 positions shown; findings below may reference images not displayed]

FINDINGS: Mild cardiomegaly, stable. Mediastinal silhouette within normal
limits.

Lungs normally inflated. Small 1-2 cm nodular density overlies the
peripheral left hemidiaphragm, indeterminate. Small left pleural
effusion. No pulmonary edema. No other focal infiltrates. No
pneumothorax.

No acute osseous abnormality.
IMPRESSION: 1. Small left pleural effusion.
2. 1-2 cm nodular density overlying the left hemidiaphragm. As this
is not visible on previous radiograph from 01/04/2018, focal
atelectatic changes is favored. However, a superimposed nodule is
not entirely excluded. Close interval follow-up radiograph in 1-2
weeks recommended to ensure resolution.
3. Mild cardiomegaly without pulmonary edema.

## 2018-07-02 ENCOUNTER — Telehealth: Payer: Self-pay | Admitting: *Deleted

## 2018-07-02 NOTE — Telephone Encounter (Signed)
Sandy from [facility] left a message requesting clinic visit notes from patients July 12th visit with Dr. Allena Katz.   She says you can fax them to fax# [phone] attn:Sarah

## 2018-07-03 ENCOUNTER — Encounter: Payer: Medicaid Other | Admitting: Physical Medicine & Rehabilitation

## 2018-07-06 NOTE — Telephone Encounter (Signed)
Faxed visit from 7/12 to [facility] in [place]

## 2018-09-01 DEATH — deceased

## 2018-11-17 ENCOUNTER — Ambulatory Visit: Payer: Self-pay | Admitting: Adult Health

## 2018-11-17 NOTE — Progress Notes (Deleted)
STROKE NEUROLOGY FOLLOW UP NOTE  NAME: Wesley Burns DOB: Jul 22, 1966  REASON FOR VISIT: stroke follow up HISTORY FROM: partner and pt and chart  Today we had the pleasure of seeing Wesley Burns in follow-up at our Neurology Clinic. Pt was accompanied by partner.   History Summary 52 year old male with history of ESRD on hemodialysis, HTN, CVA without deficit, BPH, depression admitted on 12/07/17 for left-sided weakness and numbness, left facial droop. 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 CTA head showed right PCA stenosis but no AVM or aneurysm. Repeat CT stable. EF55-60%, carotid Doppler negative. A1c 4.0 and LDL 73. CT head on 12/27/17 showed right thalamic ICH reabsorbed, started him on ASA 81mg . PT OT recommended SNF.  During admission, patient BP improvedwith Norvasc and clonidine and added hydralazine. Pt developed abdominal pain and nausea on 12/30/17, put on NPO, and  12/31/17 symptom resolved and repeat KUB normal. Resumed diet. Had spiking fever and again abdominal pain.  Abdominal CT showed possible acute vs. Chronic cholecystitis and blood culture showed E. Coli bacteremia. Had IM hospitalist consult and Found to have elevated LFTs and lipase, concerning for biliary pancreatitis. Kept NPO, and started on ampicillin. He did not have adequate po for the last several days and after HD on 01/03/18 while on ampicillin infusion, he developed AMS with diffuse diaphoresis, hypotensive SBP 60s, poorly responsive, hypoxia with O2 sat down to 90, and tachycardia at 110s. Put pt bed flat, started IV bolus, put on non-rebreather. Pt gradually more responsive, although still lethargic. Denies abdominal pain. SBP up to 70s. CCM consulted and transferred to ICU. BP getting improved to 120s, but have spike fever at 101.3. HIDA scan again concerning for acute cholecystitis, put on meropenem and IR team performed percutaneous cholecystectomy on 01/05/18. BP improved with IV  fluids and patient returned back to baseline. During hospital stay patient received his normal dalysis with no issues. Patient finally discharged to SNF in [place], [place], on 01/11/18  On 02/07/18 she was readmitted to [facility] due to right upper quadrant abdominal pain, fever or chills, nausea and vomiting for 3 days. Treated with Abx, found to have malpositioned gallbladder drain which was changed and replaced. Surgery recommended follow-up with general surgery in 4-5 weeks, for evaluation for interval cholecystectomy. He was sent back to SNF after.  03/18/18 update Dr. 03/20/18: During the interval time, the patient has been doing better. Left sided hemiplegia improved some, but still wheelchair bound. Has PT/OT in local rehab. On dialysis, last month missed on dialysis, had fluid overload and was treated in local hospital for pulmonary edema.  BP today 151/71. On norvasc.   05/18/18 UPDATE:  Patient returns today for 55-month follow-up visit and is accompanied by his friend.  He continues to reside at [facility] in [place], [place] where he continues to obtain PT/OT 5 days/week.  Continues to have left hemiparesis with spasticity.  Currently sitting in nursing home recliner as he continues to not be able to walk.  He continues to undergo HD every Tuesday Thursday Saturday.  He was recently hospitalized at [facility] in [place], [place] approximately 2 weeks ago for kidney stones where he had to undergo a procedure and the vein was hit causing blood loss and multiple transfusions per friend.  Attempting to obtain records.  Patient was on aspirin 81 mg prior to that admission but currently not on aspirin according to nursing home paperwork.  Blood pressure mildly elevated 156/70.  Patient is frustrated due to slow process of recovery.  Patient will be undergoing Botox treatments with Dr. Allena Katz on 06/12/2018 in which she is looking forward to in hopes of improving faster.  Denies new or worsening  stroke/TIA symptoms.  Interval history 11/17/2018: Patient is being seen today for 35-month follow-up visit.  He did have appointment with Dr. Allena Katz to undergo Botox injections which was scheduled but appears as though patient canceled appointment prior to receiving injections.     REVIEW OF SYSTEMS: Full 14 system review of systems performed and notable only for those listed below and in HPI above, all others are negative:  Walking difficulty and weakness  The following represents the patient's updated allergies and side effects list: Allergies  Allergen Reactions  . Prednisone Hives, Nausea And Vomiting and Swelling    Joint swelling, also Joint swelling  . Tramadol Hives, Itching and Rash  . Amlodipine Nausea And Vomiting  . Zosyn [Piperacillin Sod-Tazobactam So]     Hot flash and shortness of breath  . Lactose Intolerance (Gi) Diarrhea    No dairy  . Adhesive [Tape] Rash    The neurologically relevant items on the patient's problem list were reviewed on today's visit.  Neurologic Examination  A problem focused neurological exam (12 or more points of the single system neurologic examination, vital signs counts as 1 point, cranial nerves count for 8 points) was performed.  There were no vitals taken for this visit.  General - Well nourished, well developed, frail pleasant middle-aged Caucasian male, in no apparent distress.  Ophthalmologic - Sharp disc margins OU.   Cardiovascular - Regular rate and rhythm.  Mental Status -  Level of arousal and orientation to time, place, and person were intact. Language including expression, naming, repetition, comprehension was assessed and found intact. Fund of Knowledge was assessed and was intact.  Cranial Nerves II - XII - II - Visual field intact OU. III, IV, VI - Extraocular movements intact. V - Facial sensation intact bilaterally. VII - left facial droop. VIII - Hearing & vestibular intact bilaterally. X - Palate elevates  symmetrically. XI - Chin turning & shoulder shrug intact bilaterally. XII - Tongue protrusion intact.  Motor Strength - The patient's strength was normal in RUE and RLE, however, LUE 3/5 proximal and distal, LLE 2/5 proximal, 0/5 distal, 2/5 knee extension. Limited left shoulder ROM, and flexibility and limited left lower extremity flexibility with passive ROM.  Bulk was normal and fasciculations were absent.   Motor Tone - Muscle tone was assessed at the neck and appendages and was normal.  Reflexes - The patient's reflexes were 2+ in all extremities and he had no pathological reflexes.  Sensory - Light touch, temperature/pinprick were assessed and were normal.    Coordination - The patient had normal movements in the right hand with no ataxia or dysmetria.  Tremor was absent.  Gait and Station - wheelchair bound, not tested    Data reviewed: I personally reviewed the images and agree with the radiology interpretations.   NM Hepatobiliary Scan Including Gall Bladder  01/04/2018 Nonvisualization of the gallbladder consistent with occlusion at the level of the cystic duct, consistent with acute cholecystitis.  Slow progression of radiotracer to the small bowel, consistent with at least partially patent extrahepatic common bile duct.  DG Chest Portable 1 View 01/04/2018 IMPRESSION: Hypoinflation. No evidence of acute airspace disease.   Ct Head Code Stroke Wo Contrast Result Date: 12/07/2017 IMPRESSION:  1. Acute right thalamic hemorrhage. Mild  edema and local mass effect.  2. 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  3. New age indeterminate left thalamic lacunar infarct.    Ct Head Wo Contrast 12/27/2017  IMPRESSION: Recent right thalamic intraparenchymal hematoma is now isodense to edematous brain.  No evidence of additional bleeding. Old hemorrhagic infarction in the right basal ganglia. Resolution of  intraventricular blood. Ventricular size stable.  DG Chest Port 1 View 12/26/2017 IMPRESSION: No active disease.  Ct Angio Head W Or Wo Contrast Result Date: 12/07/2017 IMPRESSION: 1. No aneurysm, vascular malformation, or spot sign associated with the right thalamic hemorrhage. 2. Patent circle of Willis with intracranial atherosclerosis as above.  Moderate to severe right PCA stenoses.   Ct Head Wo Contrast Result Date: 12/08/2017 IMPRESSION:  1. Stable right thalamic hematoma and small volume of hemorrhage in right lateral ventricle.  2. Mild increase of mass effect with 5 mm right-to-left midline shift, previously 4 mm.  3. No new acute intracranial abnormality identified.   ECHO  12/12/2017 Study Conclusions - Left ventricle: The cavity size was normal. There was moderate concentric hypertrophy. Systolic function was normal. The estimated ejection fraction was in the range of 55% to 60%. Wall motion was normal; there were no regional wall motion abnormalities. There was a reduced contribution of atrial contraction to ventricular filling, due to increased ventricular diastolic pressure or atrial contractile dysfunction. Doppler parameters are consistent with a reversible restrictive pattern, indicative of decreased left ventricular diastolic compliance and/or increased left atrial pressure (grade 3 diastolic dysfunction). Doppler parameters are consistent with high ventricular filling pressure. - Aortic valve: Trileaflet; mildly thickened, mildly calcified leaflets. Moderate focal calcification involving the left coronary cusp. - Aorta: Ascending aorta diameter: 40 mm (ED). - Ascending aorta: The ascending aorta was mildly dilated. - Mitral valve: Calcified annulus. There was mild regurgitation. - Left atrium: The atrium was mildly dilated. - Right ventricle: The cavity size was mildly dilated. Wall thickness was normal. - Tricuspid valve:  There was trivial regurgitation  Carotid Duplex           12/11/2017 Final Interpretation: Right Carotid:  The extracranial vessels were near-normal with only minimal wall thickening or plaque. Left Carotid: The extracranial vessels were near-normal with only minimal wall thickening or plaque. Vertebrals: Left vertebral artery was patent with antegrade flow.  Right vertebral artery was not visualized. Subclavians: Normal flow hemodynamics were seen in the right subclavian artery.  Left subclavian artery exhibits monophasic flow, suggestive of possbile proximal obstruction  ABDOMEN - 1 VIEW      12/30/2017 11:34 IMPRESSION: Air-filled loops of small and large bowel noted suggesting adynamic ileus. Follow-up exam suggested to exclude bowel obstruction.  ABDOMEN - 1 VIEW      12/31/2017 09:48 IMPRESSION No evidence of bowel obstruction or ileus.  PORTABLE CHEST 1 VIEW 01/02/2018 08:42 IMPRESSION: No evidence of acute cardiopulmonary disease  CT Abdomen/Pelvis 01/02/2018 Multiple gallstones with thickening and distention of the gallbladder with slight haziness in the pericholecystic fat which could represent acute or chronic cholecystitis. Small amount of free fluid in the pelvis.  Component     Latest Ref Rng & Units 12/07/2017 12/08/2017 12/09/2017 12/15/2017  Cholesterol     0 - 200 mg/dL   944   Triglycerides     <150 mg/dL   54   HDL Cholesterol     >40 mg/dL   65   Total CHOL/HDL Ratio     RATIO   2.3  VLDL     0 - 40 mg/dL   11   LDL (calc)     0 - 99 mg/dL   73   Hemoglobin F1M     4.8 - 5.6 %  4.0 (L)    Mean Plasma Glucose     mg/dL  38.4    HIV Screen 4th Generation wRfx     Non Reactive Non Reactive     Vitamin B12     180 - 914 pg/mL    587    Assessment: As you may recall, he is a 52 y.o. Caucasian male with PMH of ESRD on hemodialysis, HTN, CVA without deficit, BPH, depression admitted on 12/07/17 for right thalamic ICH with IVH, left thalamic stroke.  CTA head showed right PCA stenosis but no AVM or aneurysm. Repeat CT stable. EF55-60%, carotid Doppler negative. A1c 4.0 and LDL 73. CT head on 12/27/17 showed right thalamic ICH reabsorbed, started him on ASA 81mg . PT OT recommended SNF.  During admission, patient developed acute cholecystitis, bacteremia, sepsis, septic shock. CCM consulted and transferred to ICU. HIDA scan again concerning for acute cholecystitis and performed percutaneous cholecystectomy on 01/05/18. readmitted on 02/07/18 for abdominal pain, fever or chills, nausea and vomiting. Treated with Abx, found to have malpositioned gallbladder drain which was changed and replaced. Surgery recommended follow-up with general surgery in 4-5 weeks, for evaluation for interval cholecystectomy. During the interval time, the patient still has left sided hemiplegia improved some, but still wheelchair bound. Has PT/OT in local rehab.   Patient returns today for follow-up and is accompanied by his friend.  He continues to improve his left-sided hemiparesis and will be undergoing Botox injections for left spasticity.  Plan:  -[facility] and aspirin was discontinued for unknown reasons -consider restarting for secondary stroke prevention  -ADDENDUM: received paperwork from [facility]) where it was recommended to continue aspirin 81mg  at discharge. Will fax new prescription to nursing home - [facility] in [place], [place] -Continue PT/OT for continued improvement in strength -Recommended tizanidine prior to therapies (previous PRN prescription according to nursing home [facility]) -Undergo Botox injection 06/12/2018 with Dr. Allena Katz - continue dialysis as per schedule - recommend to continue self exercise to improve function  - Follow up with your primary care physician for stroke risk factor modification. Recommend maintain blood pressure goal <130/80, diabetes  with hemoglobin A1c goal below 7.0% and lipids with LDL cholesterol goal below 70 mg/dL.   - follow up in 6 months or call earlier if needed  Greater than 50% of time during this 25 minute visit was spent on counseling,explanation of diagnosis of ICH, reviewing risk factor management of HTN and HLD, planning of further management, discussion with patient and family and coordination of care  George Hugh, [facility]  [facility] [address] [place], [place] [address]  Phone [phone] Fax [phone]

## 2018-11-18 ENCOUNTER — Encounter: Payer: Self-pay | Admitting: Adult Health

## 2018-11-19 ENCOUNTER — Telehealth: Payer: Self-pay

## 2018-11-19 NOTE — Telephone Encounter (Signed)
Patient no show for appt on 11/17/2018.
